# Patient Record
Sex: Male | Born: 1975 | Race: Black or African American | Hispanic: No | Marital: Single | Smoking: Never smoker
Health system: Southern US, Community
[De-identification: ages and names within clinical notes are randomized; demographics above are authoritative.]

## PROBLEM LIST (undated history)

## (undated) DIAGNOSIS — U071 COVID-19: Secondary | ICD-10-CM

## (undated) DIAGNOSIS — J189 Pneumonia, unspecified organism: Secondary | ICD-10-CM

## (undated) DIAGNOSIS — J8410 Pulmonary fibrosis, unspecified: Secondary | ICD-10-CM

## (undated) DIAGNOSIS — J1282 Pneumonia due to coronavirus disease 2019: Secondary | ICD-10-CM

## (undated) DIAGNOSIS — F840 Autistic disorder: Secondary | ICD-10-CM

## (undated) DIAGNOSIS — J9621 Acute and chronic respiratory failure with hypoxia: Secondary | ICD-10-CM

## (undated) DIAGNOSIS — D573 Sickle-cell trait: Secondary | ICD-10-CM

## (undated) HISTORY — PX: NO PAST SURGERIES: SHX2092

---

## 2015-02-12 DIAGNOSIS — J189 Pneumonia, unspecified organism: Secondary | ICD-10-CM

## 2015-02-12 HISTORY — DX: Pneumonia, unspecified organism: J18.9

## 2015-02-28 ENCOUNTER — Encounter (HOSPITAL_BASED_OUTPATIENT_CLINIC_OR_DEPARTMENT_OTHER): Payer: Self-pay | Admitting: *Deleted

## 2015-02-28 ENCOUNTER — Emergency Department (HOSPITAL_BASED_OUTPATIENT_CLINIC_OR_DEPARTMENT_OTHER): Payer: Medicare Other

## 2015-02-28 ENCOUNTER — Inpatient Hospital Stay (HOSPITAL_BASED_OUTPATIENT_CLINIC_OR_DEPARTMENT_OTHER)
Admission: EM | Admit: 2015-02-28 | Discharge: 2015-03-10 | DRG: 193 | Disposition: A | Discharge status: Home / Self Care | Payer: Medicare Other | Attending: Internal Medicine | Admitting: Internal Medicine

## 2015-02-28 DIAGNOSIS — S27309S Unspecified injury of lung, unspecified, sequela: Secondary | ICD-10-CM | POA: Diagnosis not present

## 2015-02-28 DIAGNOSIS — R000 Tachycardia, unspecified: Secondary | ICD-10-CM | POA: Diagnosis not present

## 2015-02-28 DIAGNOSIS — Z6833 Body mass index (BMI) 33.0-33.9, adult: Secondary | ICD-10-CM | POA: Diagnosis not present

## 2015-02-28 DIAGNOSIS — D573 Sickle-cell trait: Secondary | ICD-10-CM | POA: Diagnosis present

## 2015-02-28 DIAGNOSIS — A419 Sepsis, unspecified organism: Secondary | ICD-10-CM

## 2015-02-28 DIAGNOSIS — E669 Obesity, unspecified: Secondary | ICD-10-CM | POA: Diagnosis present

## 2015-02-28 DIAGNOSIS — R0600 Dyspnea, unspecified: Secondary | ICD-10-CM | POA: Insufficient documentation

## 2015-02-28 DIAGNOSIS — S27309A Unspecified injury of lung, unspecified, initial encounter: Secondary | ICD-10-CM

## 2015-02-28 DIAGNOSIS — J9601 Acute respiratory failure with hypoxia: Secondary | ICD-10-CM | POA: Diagnosis present

## 2015-02-28 DIAGNOSIS — R0689 Other abnormalities of breathing: Secondary | ICD-10-CM | POA: Diagnosis not present

## 2015-02-28 DIAGNOSIS — F840 Autistic disorder: Secondary | ICD-10-CM | POA: Diagnosis present

## 2015-02-28 DIAGNOSIS — R509 Fever, unspecified: Secondary | ICD-10-CM

## 2015-02-28 DIAGNOSIS — R0902 Hypoxemia: Secondary | ICD-10-CM | POA: Diagnosis present

## 2015-02-28 DIAGNOSIS — J189 Pneumonia, unspecified organism: Principal | ICD-10-CM

## 2015-02-28 DIAGNOSIS — E876 Hypokalemia: Secondary | ICD-10-CM

## 2015-02-28 HISTORY — DX: Pneumonia, unspecified organism: J18.9

## 2015-02-28 HISTORY — DX: Sickle-cell trait: D57.3

## 2015-02-28 HISTORY — DX: Autistic disorder: F84.0

## 2015-02-28 LAB — CBC WITH DIFFERENTIAL/PLATELET
BASOS ABS: 0 10*3/uL (ref 0.0–0.1)
BASOS PCT: 0 %
Basophils Absolute: 0 10*3/uL (ref 0.0–0.1)
Basophils Relative: 0 %
EOS ABS: 0.3 10*3/uL (ref 0.0–0.7)
EOS PCT: 3 %
EOS PCT: 3 %
Eosinophils Absolute: 0.2 10*3/uL (ref 0.0–0.7)
HCT: 41.5 % (ref 39.0–52.0)
HEMATOCRIT: 41.4 % (ref 39.0–52.0)
Hemoglobin: 13.7 g/dL (ref 13.0–17.0)
Hemoglobin: 14.1 g/dL (ref 13.0–17.0)
LYMPHS ABS: 1.5 10*3/uL (ref 0.7–4.0)
LYMPHS PCT: 17 %
Lymphocytes Relative: 22 %
Lymphs Abs: 1.7 10*3/uL (ref 0.7–4.0)
MCH: 26 pg (ref 26.0–34.0)
MCH: 26.9 pg (ref 26.0–34.0)
MCHC: 33 g/dL (ref 30.0–36.0)
MCHC: 34.1 g/dL (ref 30.0–36.0)
MCV: 78.7 fL (ref 78.0–100.0)
MCV: 78.9 fL (ref 78.0–100.0)
MONO ABS: 1.2 10*3/uL — AB (ref 0.1–1.0)
MONO ABS: 1 10*3/uL (ref 0.1–1.0)
MONOS PCT: 14 %
MONOS PCT: 13 %
Neutro Abs: 5.9 10*3/uL (ref 1.7–7.7)
Neutro Abs: 4.8 10*3/uL (ref 1.7–7.7)
Neutrophils Relative %: 66 %
Neutrophils Relative %: 62 %
PLATELETS: 229 10*3/uL (ref 150–400)
PLATELETS: 219 10*3/uL (ref 150–400)
RBC: 5.27 MIL/uL (ref 4.22–5.81)
RBC: 5.25 MIL/uL (ref 4.22–5.81)
RDW: 14.6 % (ref 11.5–15.5)
RDW: 14.6 % (ref 11.5–15.5)
WBC: 8.8 10*3/uL (ref 4.0–10.5)
WBC: 7.7 10*3/uL (ref 4.0–10.5)

## 2015-02-28 LAB — COMPREHENSIVE METABOLIC PANEL
ALBUMIN: 3.3 g/dL — AB (ref 3.5–5.0)
ALT: 19 U/L (ref 17–63)
ALT: 18 U/L (ref 17–63)
ANION GAP: 10 (ref 5–15)
AST: 31 U/L (ref 15–41)
AST: 30 U/L (ref 15–41)
Albumin: 3.6 g/dL (ref 3.5–5.0)
Alkaline Phosphatase: 56 U/L (ref 38–126)
Alkaline Phosphatase: 56 U/L (ref 38–126)
Anion gap: 8 (ref 5–15)
BILIRUBIN TOTAL: 0.7 mg/dL (ref 0.3–1.2)
BILIRUBIN TOTAL: 0.5 mg/dL (ref 0.3–1.2)
BUN: 8 mg/dL (ref 6–20)
BUN: 5 mg/dL — ABNORMAL LOW (ref 6–20)
CALCIUM: 8.3 mg/dL — AB (ref 8.9–10.3)
CALCIUM: 8.6 mg/dL — AB (ref 8.9–10.3)
CHLORIDE: 104 mmol/L (ref 101–111)
CO2: 27 mmol/L (ref 22–32)
CO2: 26 mmol/L (ref 22–32)
CREATININE: 1 mg/dL (ref 0.61–1.24)
Chloride: 102 mmol/L (ref 101–111)
Creatinine, Ser: 1.02 mg/dL (ref 0.61–1.24)
GFR calc Af Amer: >60 mL/min (ref >60)
GFR calc Af Amer: >60 mL/min (ref >60)
GFR calc non Af Amer: >60 mL/min (ref >60)
GLUCOSE: 90 mg/dL (ref 65–99)
Glucose, Bld: 96 mg/dL (ref 65–99)
POTASSIUM: 3.4 mmol/L — AB (ref 3.5–5.1)
POTASSIUM: 3.5 mmol/L (ref 3.5–5.1)
SODIUM: 140 mmol/L (ref 135–145)
Sodium: 137 mmol/L (ref 135–145)
TOTAL PROTEIN: 7.3 g/dL (ref 6.5–8.1)
Total Protein: 7.1 g/dL (ref 6.5–8.1)

## 2015-02-28 LAB — STREP PNEUMONIAE URINARY ANTIGEN: STREP PNEUMO URINARY ANTIGEN: NEGATIVE

## 2015-02-28 LAB — PROCALCITONIN: Procalcitonin: <0.1 ng/mL

## 2015-02-28 LAB — APTT: APTT: 34 s (ref 24–37)

## 2015-02-28 LAB — PROTIME-INR
INR: 1.01 (ref 0.00–1.49)
Prothrombin Time: 13.5 seconds (ref 11.6–15.2)

## 2015-02-28 LAB — RAPID STREP SCREEN (MED CTR MEBANE ONLY): Streptococcus, Group A Screen (Direct): NEGATIVE

## 2015-02-28 LAB — LACTIC ACID, PLASMA
LACTIC ACID, VENOUS: 1.1 mmol/L (ref 0.5–2.0)
LACTIC ACID, VENOUS: 1.5 mmol/L (ref 0.5–2.0)

## 2015-02-28 LAB — I-STAT CG4 LACTIC ACID, ED: Lactic Acid, Venous: 1.19 mmol/L (ref 0.5–2.0)

## 2015-02-28 MED ORDER — DEXTROSE 5 % IV SOLN
1.0000 g | INTRAVENOUS | Status: DC
Start: 2015-03-01 — End: 2015-03-02
  Administered 2015-03-01 – 2015-03-02 (×2): 1 g via INTRAVENOUS
  Filled 2015-02-28 (×2): qty 10

## 2015-02-28 MED ORDER — ALBUTEROL SULFATE (2.5 MG/3ML) 0.083% IN NEBU
2.5000 mg | INHALATION_SOLUTION | Freq: Four times a day (QID) | RESPIRATORY_TRACT | Status: DC
  Administered 2015-02-28: 2.5 mg via RESPIRATORY_TRACT
  Filled 2015-02-28: qty 3

## 2015-02-28 MED ORDER — ONDANSETRON HCL 4 MG/2ML IJ SOLN: 4.0000 mg | Freq: Four times a day (QID) | INTRAMUSCULAR | Status: DC | PRN

## 2015-02-28 MED ORDER — SODIUM CHLORIDE 0.9 % IV BOLUS (SEPSIS)
1000.0000 mL | Freq: Once | INTRAVENOUS | Status: AC
  Administered 2015-02-28: 1000 mL via INTRAVENOUS

## 2015-02-28 MED ORDER — ACETAMINOPHEN 325 MG PO TABS
650.0000 mg | ORAL_TABLET | Freq: Four times a day (QID) | ORAL | Status: DC | PRN
  Administered 2015-03-02 – 2015-03-10 (×3): 650 mg via ORAL
  Filled 2015-02-28 (×3): qty 2

## 2015-02-28 MED ORDER — ALBUTEROL SULFATE (2.5 MG/3ML) 0.083% IN NEBU: 2.5000 mg | INHALATION_SOLUTION | RESPIRATORY_TRACT | Status: DC | PRN

## 2015-02-28 MED ORDER — ONDANSETRON HCL 4 MG PO TABS: 4.0000 mg | ORAL_TABLET | Freq: Four times a day (QID) | ORAL | Status: DC | PRN

## 2015-02-28 MED ORDER — DEXTROSE 5 % IV SOLN
500.0000 mg | Freq: Once | INTRAVENOUS | Status: AC
  Administered 2015-02-28: 500 mg via INTRAVENOUS

## 2015-02-28 MED ORDER — ACETAMINOPHEN 650 MG RE SUPP: 650.0000 mg | Freq: Four times a day (QID) | RECTAL | Status: DC | PRN

## 2015-02-28 MED ORDER — AZITHROMYCIN 500 MG IV SOLR
INTRAVENOUS | Status: AC
  Filled 2015-02-28: qty 500

## 2015-02-28 MED ORDER — HEPARIN SODIUM (PORCINE) 5000 UNIT/ML IJ SOLN
5000.0000 [IU] | Freq: Three times a day (TID) | INTRAMUSCULAR | Status: DC
  Administered 2015-02-28 – 2015-03-09 (×23): 5000 [IU] via SUBCUTANEOUS
  Filled 2015-02-28 (×22): qty 1

## 2015-02-28 MED ORDER — OSELTAMIVIR PHOSPHATE 75 MG PO CAPS
75.0000 mg | ORAL_CAPSULE | Freq: Two times a day (BID) | ORAL | Status: DC
Start: 2015-02-28 — End: 2015-03-01
  Administered 2015-02-28: 75 mg via ORAL
  Filled 2015-02-28 (×3): qty 1

## 2015-02-28 MED ORDER — SODIUM CHLORIDE 0.9 % IV SOLN
INTRAVENOUS | Status: AC
  Administered 2015-03-01: 17:00:00 via INTRAVENOUS

## 2015-02-28 MED ORDER — SODIUM CHLORIDE 0.9% FLUSH
3.0000 mL | Freq: Two times a day (BID) | INTRAVENOUS | Status: DC
  Administered 2015-03-01 – 2015-03-10 (×15): 3 mL via INTRAVENOUS

## 2015-02-28 MED ORDER — POLYETHYLENE GLYCOL 3350 17 G PO PACK
17.0000 g | PACK | Freq: Every day | ORAL | Status: DC | PRN
  Administered 2015-03-04 – 2015-03-05 (×2): 17 g via ORAL
  Filled 2015-02-28 (×2): qty 1

## 2015-02-28 MED ORDER — DEXTROSE 5 % IV SOLN
1.0000 g | Freq: Once | INTRAVENOUS | Status: AC
  Administered 2015-02-28: 1 g via INTRAVENOUS
  Filled 2015-02-28: qty 10

## 2015-02-28 MED ORDER — ALBUTEROL SULFATE (2.5 MG/3ML) 0.083% IN NEBU
2.5000 mg | INHALATION_SOLUTION | Freq: Two times a day (BID) | RESPIRATORY_TRACT | Status: DC
  Administered 2015-03-01 – 2015-03-03 (×6): 2.5 mg via RESPIRATORY_TRACT
  Filled 2015-02-28 (×6): qty 3

## 2015-02-28 MED ORDER — IPRATROPIUM-ALBUTEROL 0.5-2.5 (3) MG/3ML IN SOLN
3.0000 mL | Freq: Once | RESPIRATORY_TRACT | Status: AC
  Administered 2015-02-28: 3 mL via RESPIRATORY_TRACT
  Filled 2015-02-28: qty 3

## 2015-02-28 MED ORDER — DEXTROSE 5 % IV SOLN
500.0000 mg | INTRAVENOUS | Status: DC
  Administered 2015-03-01: 500 mg via INTRAVENOUS
  Filled 2015-02-28: qty 500

## 2015-02-28 MED ORDER — METHYLPREDNISOLONE SODIUM SUCC 125 MG IJ SOLR
60.0000 mg | Freq: Four times a day (QID) | INTRAMUSCULAR | Status: DC
  Administered 2015-02-28 – 2015-03-01 (×2): 60 mg via INTRAVENOUS
  Filled 2015-02-28 (×2): qty 2

## 2015-02-28 MED ORDER — POTASSIUM CHLORIDE CRYS ER 20 MEQ PO TBCR
40.0000 meq | EXTENDED_RELEASE_TABLET | Freq: Two times a day (BID) | ORAL | Status: AC
  Administered 2015-02-28 – 2015-03-01 (×2): 40 meq via ORAL
  Filled 2015-02-28 (×2): qty 2

## 2015-02-28 NOTE — ED Notes (Signed)
Confirmed PNA with PCP today.  Pt tachypnea, reports chest tightness.  Decreased appetite, facial swelling noted.

## 2015-02-28 NOTE — ED Notes (Signed)
MD at bedside. 

## 2015-02-28 NOTE — Progress Notes (Signed)
Patient coming from [facility] with multifocal PNA.  85% on RA-- up to 96% on 3L.  From home so will treat as CAP. Tele admit  Marlin Canary DO

## 2015-02-28 NOTE — ED Provider Notes (Signed)
CSN: [id number]     Arrival date & time 02/28/15  1008 History   First MD Initiated Contact with Patient 02/28/15 1019     Chief Complaint  Patient presents with  . Pneumonia    Login Juan Cameron is a 40 y.o. male with history of autism who presents to the emergency department the sister today who reports the patient has had ongoing cough for the past 2 weeks. They report this is worsened recently and today he has had more shortness of breath and slight wheezing. They report he saw his primary care doctor yesterday who did a chest x-ray and he was diagnosed with pneumonia when they called him this morning. The patient has not been started on antibiotics. She reports today while the patient was at school he has increased difficulty breathing and some wheezing. The patient does not have a history of pneumonia and he is not a smoker. No fevers, hemoptysis, abdominal pain, nausea, vomiting, diarrhea, rashes, or changes to his urination.  Patient is a 40 y.o. male presenting with pneumonia. The history is provided by the patient. No language interpreter was used.  Pneumonia Associated symptoms include coughing. Pertinent negatives include no abdominal pain, chest pain, chills, congestion, fever, headaches, nausea, neck pain, rash, sore throat or vomiting.    Past Medical History  Diagnosis Date  . Autism   . Sickle cell trait (HCC)    History reviewed. No pertinent past surgical history. History reviewed. No pertinent family history. Social History  Substance Use Topics  . Smoking status: Never Smoker   . Smokeless tobacco: None  . Alcohol Use: No    Review of Systems  Constitutional: Positive for appetite change. Negative for fever and chills.  HENT: Negative for congestion, sneezing, sore throat and trouble swallowing.   Eyes: Negative for visual disturbance.  Respiratory: Positive for cough, shortness of breath and wheezing.   Cardiovascular: Negative for chest pain.  Gastrointestinal:  Negative for nausea, vomiting, abdominal pain and diarrhea.  Genitourinary: Negative for dysuria, decreased urine volume and difficulty urinating.  Musculoskeletal: Negative for back pain, neck pain and neck stiffness.  Skin: Negative for rash.  Neurological: Negative for syncope and headaches.      Allergies  Peanuts  Home Medications   Prior to Admission medications   Not on File   BP 162/100 mmHg  Pulse 97  Temp(Src) 99.1 F (37.3 C) (Oral)  Resp 30  Ht  (1.753 m)  Wt 107.049 kg  BMI 34.84 kg/m2  SpO2 92% Physical Exam  Constitutional: He appears well-developed and well-nourished. No distress.  Nontoxic appearing.  HENT:  Head: Normocephalic and atraumatic.  Right Ear: External ear normal.  Left Ear: External ear normal.  Mouth/Throat: Oropharynx is clear and moist.  Bilateral tympanic membranes are pearly-gray without erythema or loss of landmarks.   Eyes: Conjunctivae are normal. Pupils are equal, round, and reactive to light. Right eye exhibits no discharge. Left eye exhibits no discharge.  Neck: Normal range of motion. Neck supple. No JVD present. No tracheal deviation present.  Cardiovascular: Normal rate, regular rhythm, normal heart sounds and intact distal pulses.  Exam reveals no gallop and no friction rub.   No murmur heard. Pulmonary/Chest: Effort normal. No respiratory distress. He has wheezes.  Patient has scattered crackles to his bilateral bases. No wheezing noted. On arrival patient's oxygen saturation is 85% on room air. On my evaluation patient's oxygen saturation is 92% on 3 L via nasal cannula. Symmetric chest expansion bilaterally. No respiratory  distress. Patient speaking in complete sentences.  Abdominal: Soft. He exhibits no distension. There is no tenderness. There is no guarding.  Musculoskeletal: He exhibits no edema or tenderness.  Lymphadenopathy:    He has no cervical adenopathy.  Neurological: He is alert. Coordination normal.   Skin: Skin is warm and dry. No rash noted. He is not diaphoretic. No erythema. No pallor.  Psychiatric: He has a normal mood and affect. His behavior is normal.  Nursing note and vitals reviewed.   ED Course  Procedures (including critical care time) Labs Review Labs Reviewed  RAPID STREP SCREEN (NOT AT [facility])  CULTURE, BLOOD (ROUTINE X 2)  CULTURE, BLOOD (ROUTINE X 2)  COMPREHENSIVE METABOLIC PANEL  CBC WITH DIFFERENTIAL/PLATELET  I-STAT CG4 LACTIC ACID, ED    Imaging Review Dg Chest 2 View  02/28/2015  CLINICAL DATA:  Cough and chest tightness for 1 week. Rapid respirations. Initial encounter. EXAM: CHEST  2 VIEW COMPARISON:  None. FINDINGS: There is bilateral lower lobe and right middle lobe airspace disease most consistent with pneumonia. No pneumothorax or pleural effusion is identified. Heart size is normal. Lung volumes are low. IMPRESSION: Extensive bilateral lower lobe and right middle lobe airspace disease most consistent with pneumonia. Electronically Signed   By: Drusilla Kanner M.D.   On: 02/28/2015 11:14   I have personally reviewed and evaluated these images and lab results as part of my medical decision-making.   EKG Interpretation None      Filed Vitals:   02/28/15 1013 02/28/15 1035 02/28/15 1050  BP: 162/100    Pulse: 97    Temp: 99.1 F (37.3 C)    TempSrc: Oral    Resp: 24 30   Height: 5\' 9"  (1.753 m)    Weight: 107.049 kg    SpO2: 88% 92% 92%     MDM   Meds given in ED:  Medications  azithromycin (ZITHROMAX) 500 MG injection (  Not Given 02/28/15 1155)  albuterol (PROVENTIL) (2.5 MG/3ML) 0.083% nebulizer solution 2.5 mg (not administered)  sodium chloride 0.9 % bolus 1,000 mL (0 mLs Intravenous Stopped 02/28/15 1505)  ipratropium-albuterol (DUONEB) 0.5-2.5 (3) MG/3ML nebulizer solution 3 mL (3 mLs Nebulization Given 02/28/15 1049)  cefTRIAXone (ROCEPHIN) 1 g in dextrose 5 % 50 mL IVPB (0 g Intravenous Stopped 02/28/15 1155)  azithromycin  (ZITHROMAX) 500 mg in dextrose 5 % 250 mL IVPB (0 mg Intravenous Stopped 02/28/15 1505)    New Prescriptions   No medications on file    Final diagnoses:  CAP (community acquired pneumonia)  Hypoxia   This is a 40 y.o. male with history of autism who presents to the emergency department the sister today who reports the patient has had ongoing cough for the past 2 weeks. They report this is worsened recently and today he has had more shortness of breath and slight wheezing. They report he saw his primary care doctor yesterday who did a chest x-ray and he was diagnosed with pneumonia when they called him this morning. The patient has not been started on antibiotics. She reports today while the patient was at school he has increased difficulty breathing and some wheezing.  On exam the patient is afebrile and nontoxic appearing. Patient has diminished lung sounds to his bilateral bases with scattered wheezing. He is not tachypneic. On arrival to the emergency department his oxygen saturation was 85% on room air. On 3 L via nasal cannula his oxygen improved to 92%. Chest x-ray indicates multifocal pneumonia. Will start on  antibiotic therapy with azithromycin and Rocephin and will consult for admission due to hypoxia. Blood cultures drawn. Lactic acid is normal. CBC shows no leukocytosis. I consulted with hospitalist Dr. Benjamine Mola who accepted the patient for admission. She requested telemetry temporary admission orders. The patient and family are in agreement with admission.   This patient was discussed with Dr. Criss Alvine who agrees with assessment and plan.      Everlene Farrier, PA-C 02/28/15 1721  Pricilla Loveless, MD 03/03/15 [phone]

## 2015-02-28 NOTE — H&P (Signed)
[facility] History and Physical     History and Physical:    Juan Cameron   ZOX:[id number] DOB: July 15, 1975 DOA: 02/28/2015  Referring MD/provider:  PCP: ASRES,ALEHEGN, MD   Chief Complaint: hypoxia  History of Present Illness:   Juan Cameron is an 40 y.o. male past medical history of autism was incidentally emergency department for hypoxia is sister is here today and provided most of the history, she relates that for the past 2 weeks he's been having a cough that has progressively gotten worse. He saw her primary care doctor the day prior to admission who did a chest x-ray and called her up on the day of admission to inform her of the results and come to the ED. The patient was not giving antibiotics. She denies any fever but he has significant shortness of breath especially with ambulation. No sick contacts at home.  ROS:   ROS  Constitutional: No fever, no chills;  Appetite normal; No weight loss, no weight gain, no fatigue.   HEENT: No blurry vision, no diplopia, no pharyngitis, no dysphagia  CV: No chest pain, no palpitations, no PND, no orthopnea, no edema.   Resp:  no pleuritic pain.  GI: No nausea, no vomiting, no diarrhea, no melena, no hematochezia, no constipation, no abdominal pain.   GU: No dysuria, no hematuria, no frequency, no urgency.  MSK: No myalgias, no arthralgias.   Neuro:  No headache, no focal neurological deficits, no history of seizures.   Psych: No depression, no anxiety.   Endo: No heat intolerance, no cold intolerance, no polyuria, no polydipsia   Skin: No rashes, no skin lesions.   Heme: No easy bruising.   Travel history: No recent travel.   Past Medical History:   Past Medical History  Diagnosis Date  . Autism   . Sickle cell trait (HCC)   . Pneumonia 02/2015  . Autism     Past Surgical History:   Past Surgical History  Procedure Laterality Date  . No past surgeries      Social History:   Social History   Social History    . Marital Status: Single    Spouse Name: N/A  . Number of Children: N/A  . Years of Education: N/A   Occupational History  . Not on file.   Social History Main Topics  . Smoking status: Never Smoker   . Smokeless tobacco: Never Used  . Alcohol Use: No  . Drug Use: No  . Sexual Activity: Yes   Other Topics Concern  . Not on file   Social History Narrative  . No narrative on file    Family history:   Family History  Problem Relation Age of Onset  . Heart attack Mother   father died of a heroin overdose.   Allergies   Peanuts  Current Medications:   Prior to Admission medications   Medication Sig Start Date End Date Taking? Authorizing Provider  albuterol (PROAIR HFA) 108 (90 Base) MCG/ACT inhaler Inhale 2 puffs into the lungs every 6 (six) hours as needed for wheezing or shortness of breath.  02/27/15  Yes Historical Provider, MD  guaiFENesin (MUCINEX) 600 MG 12 hr tablet Take 600 mg by mouth 2 (two) times daily as needed for cough or to loosen phlegm.   Yes Historical Provider, MD    Physical Exam:   Filed Vitals:   02/28/15 1346 02/28/15 1524 02/28/15 1607 02/28/15 1742  BP: 128/80 126/85 121/87 139/85  Pulse: 99 93 95 88  Temp: 99.8 F (37.7 C)  98.2 F (36.8 C) 98.9 F (37.2 C)  TempSrc: Oral   Oral  Resp: 18 32 32 98  Height:     (1.753 m)  Weight:    103.5 kg (228 lb 2.8 oz)  SpO2: 92% 92% 92% 93%     Physical Exam: Blood pressure 139/85, pulse 88, temperature 98.9 F (37.2 C), temperature source Oral, resp. rate 98, height  (1.753 m), weight 103.5 kg (228 lb 2.8 oz), SpO2 93 %. Gen: No acute distress. Head: Normocephalic, atraumatic. Eyes: PERRL, EOMI, sclerae nonicteric. Mouth: Oropharynx Neck: Supple, no thyromegaly, no lymphadenopathy, no jugular venous distention. Chest: moderate air movement with crackles diffusely  CV: regular rate and rhythm with positive S1-S2  Abdomen: Soft, nontender, nondistended with normal active bowel  sounds. Extremities: Extremities Skin: Warm and dry. Neuro: Alert and oriented times 1; cranial nerves II through XII grossly intact. Psych: Mood and affect normal.   Data Review:    Labs: Basic Metabolic Panel:  Recent Labs Lab 02/28/15 1100  NA 137  K 3.4*  CL 102  CO2 27  GLUCOSE 96  BUN 8  CREATININE 1.02  CALCIUM 8.3*   Liver Function Tests:  Recent Labs Lab 02/28/15 1100  AST 31  ALT 19  ALKPHOS 56  BILITOT 0.7  PROT 7.3  ALBUMIN 3.6   No results for input(s): LIPASE, AMYLASE in the last 168 hours. No results for input(s): AMMONIA in the last 168 hours. CBC:  Recent Labs Lab 02/28/15 1100  WBC 8.8  NEUTROABS 5.9  HGB 13.7  HCT 41.5  MCV 78.7  PLT 229   Cardiac Enzymes: No results for input(s): CKTOTAL, CKMB, CKMBINDEX, TROPONINI in the last 168 hours.  BNP (last 3 results) No results for input(s): PROBNP in the last 8760 hours. CBG: No results for input(s): GLUCAP in the last 168 hours.  Radiographic Studies: Dg Chest 2 View  02/28/2015  CLINICAL DATA:  Cough and chest tightness for 1 week. Rapid respirations. Initial encounter. EXAM: CHEST  2 VIEW COMPARISON:  None. FINDINGS: There is bilateral lower lobe and right middle lobe airspace disease most consistent with pneumonia. No pneumothorax or pleural effusion is identified. Heart size is normal. Lung volumes are low. IMPRESSION: Extensive bilateral lower lobe and right middle lobe airspace disease most consistent with pneumonia. Electronically Signed   By: Drusilla Kanner M.D.   On: 02/28/2015 11:14   *I have personally reviewed the images above*  EKG: Independently reviewed.   Assessment/Plan:   Active Problems:   Acute respiratory failure with hypoxia (HCC)   CAP (community acquired pneumonia)   Hypokalemia I agree with IV Rocephin and azithromycin, this is to relation she has not had a fever at home. But he does feel significantly warm physical examination. I will have him check  an influenza PCR start him empirically on Tamiflu. We'll check lactic acid and hold on fluid, his borderline tachycardic blood pressure is stable. But he is significantly hypoxic. We'll start him on IV steroids as he is moving air poorly.     DVT prophylaxis  Code Status: Full. Family Communication: daughter Disposition Plan: inpatient 2-3 days  Time spent: 59 min  Marinda Elk [facility] Pager [phone]  If 7PM-7AM, please contact night-coverage [email or web address] Password [email or web address] 02/28/2015, 6:04 PM

## 2015-02-28 NOTE — Progress Notes (Signed)
Pt received on unit, telemetry leads applied, double verified with central monitoring. Pt resting comfortably in bed, no signs of distress or difficulty breathing. 2L of O2 via [place] applied. Family at bedside.  Pt and family express understanding of need for staff to be present prior to pt getting out of bed.

## 2015-02-28 NOTE — ED Notes (Signed)
Pts sister [name].  [phone]

## 2015-03-01 LAB — COMPREHENSIVE METABOLIC PANEL
ALT: 21 U/L (ref 17–63)
ANION GAP: 8 (ref 5–15)
AST: 30 U/L (ref 15–41)
Albumin: 3.3 g/dL — ABNORMAL LOW (ref 3.5–5.0)
Alkaline Phosphatase: 57 U/L (ref 38–126)
BILIRUBIN TOTAL: 0.5 mg/dL (ref 0.3–1.2)
BUN: 5 mg/dL — ABNORMAL LOW (ref 6–20)
CHLORIDE: 106 mmol/L (ref 101–111)
CO2: 27 mmol/L (ref 22–32)
Calcium: 9 mg/dL (ref 8.9–10.3)
Creatinine, Ser: 1.01 mg/dL (ref 0.61–1.24)
Glucose, Bld: 158 mg/dL — ABNORMAL HIGH (ref 65–99)
POTASSIUM: 4.8 mmol/L (ref 3.5–5.1)
Sodium: 141 mmol/L (ref 135–145)
TOTAL PROTEIN: 7.3 g/dL (ref 6.5–8.1)

## 2015-03-01 LAB — CBC
HEMATOCRIT: 42.8 % (ref 39.0–52.0)
Hemoglobin: 14.3 g/dL (ref 13.0–17.0)
MCH: 26.6 pg (ref 26.0–34.0)
MCHC: 33.4 g/dL (ref 30.0–36.0)
MCV: 79.6 fL (ref 78.0–100.0)
PLATELETS: 249 10*3/uL (ref 150–400)
RBC: 5.38 MIL/uL (ref 4.22–5.81)
RDW: 14.6 % (ref 11.5–15.5)
WBC: 8 10*3/uL (ref 4.0–10.5)

## 2015-03-01 LAB — HIV ANTIBODY (ROUTINE TESTING W REFLEX): HIV SCREEN 4TH GENERATION: NONREACTIVE

## 2015-03-01 LAB — INFLUENZA PANEL BY PCR (TYPE A & B)
H1N1FLUPCR: NOT DETECTED
INFLAPCR: NEGATIVE
INFLBPCR: NEGATIVE

## 2015-03-01 MED ORDER — PREDNISONE 20 MG PO TABS
20.0000 mg | ORAL_TABLET | Freq: Every day | ORAL | Status: DC
  Administered 2015-03-01 – 2015-03-02 (×2): 20 mg via ORAL
  Filled 2015-03-01 (×2): qty 1

## 2015-03-01 MED ORDER — AZITHROMYCIN 500 MG PO TABS
500.0000 mg | ORAL_TABLET | Freq: Every day | ORAL | Status: DC
  Filled 2015-03-01 (×2): qty 1

## 2015-03-01 NOTE — Progress Notes (Signed)
Pt. O2 sats drop to 80 with ambulation on RA, SOB occurred. Teresa Coombs 7:03 PM

## 2015-03-01 NOTE — Progress Notes (Signed)
[facility] PROGRESS NOTE    Progress Note   Juan Cameron ZHY:[id number] DOB: 18-Mar-1975 DOA: 02/28/2015 PCP: Ananias Pilgrim, MD   Brief Narrative:   Juan Cameron is an 40 y.o. male the past medical history of autism that comes in for hypoxia and diagnosed with pneumonia.  Assessment/Plan:   Acute respiratory failure with hypoxia (HCC) due to CAP (community acquired pneumonia) He continues to desaturate with ambulation, has remained afebrile with no leukocytosis. Lactic acid was less than 2, his influenza PCR was negative. Continue IV empiric antibiotics Rocephin and azithromycin. Continue albuterol treatments.    Hypokalemia: Resolved with repletion.    Autism    DVT Prophylaxis - Lovenox ordered.  Family Communication: sister Disposition Plan: Home 3-4 days Code Status:     Code Status Orders        Start     Ordered   02/28/15 1807  Full code   Continuous     02/28/15 1813    Code Status History    Date Active Date Inactive Code Status Order ID Comments User Context   This patient has a current code status but no historical code status.        IV Access:    Peripheral IV   Procedures and diagnostic studies:   Dg Chest 2 View  02/28/2015  CLINICAL DATA:  Cough and chest tightness for 1 week. Rapid respirations. Initial encounter. EXAM: CHEST  2 VIEW COMPARISON:  None. FINDINGS: There is bilateral lower lobe and right middle lobe airspace disease most consistent with pneumonia. No pneumothorax or pleural effusion is identified. Heart size is normal. Lung volumes are low. IMPRESSION: Extensive bilateral lower lobe and right middle lobe airspace disease most consistent with pneumonia. Electronically Signed   By: Drusilla Kanner M.D.   On: 02/28/2015 11:14     Medical Consultants:    None.  Anti-Infectives:   Anti-infectives    Start     Dose/Rate Route Frequency Ordered Stop   03/01/15 1200  cefTRIAXone (ROCEPHIN) 1 g in dextrose 5 % 50 mL  IVPB     1 g 100 mL/hr over 30 Minutes Intravenous Every 24 hours 02/28/15 1813 03/07/15 1159   03/01/15 1200  azithromycin (ZITHROMAX) 500 mg in dextrose 5 % 250 mL IVPB     500 mg 250 mL/hr over 60 Minutes Intravenous Every 24 hours 02/28/15 1813 03/07/15 1159   02/28/15 2200  oseltamivir (TAMIFLU) capsule 75 mg     75 mg Oral 2 times daily 02/28/15 1813 03/05/15 2159   02/28/15 1130  cefTRIAXone (ROCEPHIN) 1 g in dextrose 5 % 50 mL IVPB     1 g 100 mL/hr over 30 Minutes Intravenous  Once 02/28/15 1115 02/28/15 1155   02/28/15 1130  azithromycin (ZITHROMAX) 500 mg in dextrose 5 % 250 mL IVPB     500 mg 250 mL/hr over 60 Minutes Intravenous  Once 02/28/15 1115 02/28/15 1505   02/28/15 1124  azithromycin (ZITHROMAX) 500 MG injection    Comments:  [place]   : cabinet override      02/28/15 1124 02/28/15 2329      Subjective:    Juan Cameron he relates he feels much better than yesterday. He relates he gets winded with ambulation.  Objective:    Filed Vitals:   02/28/15 1953 03/01/15 0340 03/01/15 0348 03/01/15 0727  BP: 135/76 140/80    Pulse: 102 95    Temp: 98.8 F (37.1 C) 98.9 F (37.2 C)    TempSrc:  Oral Oral    Resp: 18 18    Height:      Weight:      SpO2: 93% 91% 95% 95%   No intake or output data in the 24 hours ending 03/01/15 0752 Filed Weights   02/28/15 1013 02/28/15 1742  Weight: 107.049 kg (236 lb) 103.5 kg (228 lb 2.8 oz)    Exam: Gen:  NAD Cardiovascular:  RRR, No M/R/G Chest and lungs:   Good air movement with crackles on the right. Abdomen:  Abdomen soft, NT/ND, + BS Extremities:  No cut or ulcers.   Data Reviewed:    Labs: Basic Metabolic Panel:  Recent Labs Lab 02/28/15 1100 02/28/15 1908 03/01/15 0328  NA 137 140 141  K 3.4* 3.5 4.8  CL 102 104 106  CO2 GLUCOSE 96 90 158*  BUN 8 5* 5*  CREATININE 1.02 1.00 1.01  CALCIUM 8.3* 8.6* 9.0   GFR Estimated Creatinine Clearance: 118.7 mL/min (by C-G formula  based on Cr of 1.01). Liver Function Tests:  Recent Labs Lab 02/28/15 1100 02/28/15 1908 03/01/15 0328  AST ALT ALKPHOS 56 56 57  BILITOT 0.7 0.5 0.5  PROT 7.3 7.1 7.3  ALBUMIN 3.6 3.3* 3.3*   No results for input(s): LIPASE, AMYLASE in the last 168 hours. No results for input(s): AMMONIA in the last 168 hours. Coagulation profile  Recent Labs Lab 02/28/15 1908  INR 1.01    CBC:  Recent Labs Lab 02/28/15 1100 02/28/15 1908 03/01/15 0328  WBC 8.8 7.7 8.0  NEUTROABS 5.9 4.8  --   HGB 13.7 14.1 14.3  HCT 41.5 41.4 42.8  MCV 78.7 78.9 79.6  PLT 229 219 249   Cardiac Enzymes: No results for input(s): CKTOTAL, CKMB, CKMBINDEX, TROPONINI in the last 168 hours. BNP (last 3 results) No results for input(s): PROBNP in the last 8760 hours. CBG: No results for input(s): GLUCAP in the last 168 hours. D-Dimer: No results for input(s): DDIMER in the last 72 hours. Hgb A1c: No results for input(s): HGBA1C in the last 72 hours. Lipid Profile: No results for input(s): CHOL, HDL, LDLCALC, TRIG, CHOLHDL, LDLDIRECT in the last 72 hours. Thyroid function studies: No results for input(s): TSH, T4TOTAL, T3FREE, THYROIDAB in the last 72 hours.  Invalid input(s): FREET3 Anemia work up: No results for input(s): VITAMINB12, FOLATE, FERRITIN, TIBC, IRON, RETICCTPCT in the last 72 hours. Sepsis Labs:  Recent Labs Lab 02/28/15 1100 02/28/15 1110 02/28/15 1908 02/28/15 2200 03/01/15 0328  PROCALCITON  --   --  <0.10  --   --   WBC 8.8  --  7.7  --  8.0  LATICACIDVEN  --  1.19 1.1 1.5  --    Microbiology Recent Results (from the past 240 hour(s))  Rapid strep screen     Status: None   Collection Time: 02/28/15 10:30 AM  Result Value Ref Range Status   Streptococcus, Group A Screen (Direct) NEGATIVE NEGATIVE Final    Comment: (NOTE) A Rapid Antigen test may result negative if the antigen level in the sample is below the detection level of this test. The  FDA has not cleared this test as a stand-alone test therefore the rapid antigen negative result has reflexed to a Group A Strep culture.   Culture, blood (routine x 2)     Status: None (Preliminary result)   Collection Time: 02/28/15 11:00 AM  Result Value Ref Range Status  Specimen Description BLOOD RIGHT ARM  Final   Special Requests BOTTLES DRAWN AEROBIC AND ANAEROBIC 5CC  Final   Culture PENDING  Incomplete   Report Status PENDING  Incomplete     Medications:   . albuterol  2.5 mg Nebulization BID  . azithromycin  500 mg Intravenous Q24H  . cefTRIAXone (ROCEPHIN)  IV  1 g Intravenous Q24H  . heparin  5,000 Units Subcutaneous 3 times per day  . methylPREDNISolone (SOLU-MEDROL) injection  60 mg Intravenous Q6H  . oseltamivir  75 mg Oral BID  . potassium chloride  40 mEq Oral BID  . sodium chloride flush  3 mL Intravenous Q12H   Continuous Infusions: . sodium chloride      Time spent: 25 min   LOS: 1 day   Marinda Elk  [facility] Pager [phone]  *Please refer to [email or web address], password [email or web address] to get updated schedule on who will round on this patient, as hospitalists switch teams weekly. If 7PM-7AM, please contact night-coverage at [email or web address], password [email or web address] for any overnight needs.  03/01/2015, 7:52 AM

## 2015-03-01 NOTE — Progress Notes (Signed)
Pt stats 86% on 2L O2 after ambulating to the bathroom. O2 titrated to 3L O2. Sats 95% on 3L. Will conyinue to monitor closely.  Sandrea Hammond RN

## 2015-03-02 ENCOUNTER — Inpatient Hospital Stay (HOSPITAL_COMMUNITY): Payer: Medicare Other

## 2015-03-02 MED ORDER — CETYLPYRIDINIUM CHLORIDE 0.05 % MT LIQD
7.0000 mL | Freq: Two times a day (BID) | OROMUCOSAL | Status: DC
  Administered 2015-03-02 – 2015-03-10 (×12): 7 mL via OROMUCOSAL

## 2015-03-02 MED ORDER — PREDNISONE 20 MG PO TABS
50.0000 mg | ORAL_TABLET | Freq: Every day | ORAL | Status: DC
  Administered 2015-03-03 – 2015-03-10 (×8): 50 mg via ORAL
  Filled 2015-03-02 (×9): qty 2

## 2015-03-02 MED ORDER — LEVOFLOXACIN 750 MG PO TABS
750.0000 mg | ORAL_TABLET | Freq: Every day | ORAL | Status: DC
  Administered 2015-03-02 – 2015-03-07 (×6): 750 mg via ORAL
  Filled 2015-03-02 (×6): qty 1

## 2015-03-02 NOTE — Progress Notes (Signed)
[facility] PROGRESS NOTE    Progress Note   Juan Cameron YQM:[id number] DOB: 1975-03-29 DOA: 02/28/2015 PCP: Ananias Pilgrim, MD   Brief Narrative:   Juan Cameron is an 40 y.o. male the past medical history of autism that comes in for hypoxia and diagnosed with pneumonia.  Assessment/Plan:   Acute respiratory failure with hypoxia (HCC) due to CAP (community acquired pneumonia) He continues to desaturate with ambulation, has remained afebrile with no leukocytosis. Chest x-ray is unchanged from previous. Lactic acid was less than 2, his influenza PCR was negative. Continue IV empiric antibiotics Rocephin and azithromycin. Continue albuterol treatments.    Hypokalemia: Resolved with repletion.  Sinus tachycardia: No evidence on telemetry continues to have sinus tachycardia with ambulation.  Autism    DVT Prophylaxis - Lovenox ordered.  Family Communication: sister Disposition Plan: Home 3 days Code Status:     Code Status Orders        Start     Ordered   02/28/15 1807  Full code   Continuous     02/28/15 1813    Code Status History    Date Active Date Inactive Code Status Order ID Comments User Context   This patient has a current code status but no historical code status.        IV Access:    Peripheral IV   Procedures and diagnostic studies:   Dg Chest 2 View  03/02/2015  CLINICAL DATA:  Dyspnea EXAM: CHEST  2 VIEW COMPARISON:  02/28/2015 chest radiograph. FINDINGS: Low lung volumes. Stable cardiomediastinal silhouette with normal heart size. No pneumothorax. Small bilateral pleural effusions, stable on the right and slightly increased on the left. Extensive patchy consolidation at both lung bases, slightly worsened on the left. No pulmonary edema. IMPRESSION: 1. Low lung volumes. Extensive patchy bibasilar lung consolidation, slightly worsened on the left, favor multifocal pneumonia. 2. Small bilateral pleural effusions, stable on the right and  increased on the left. Electronically Signed   By: Delbert Phenix M.D.   On: 03/02/2015 09:27     Medical Consultants:    None.  Anti-Infectives:   Anti-infectives    Start     Dose/Rate Route Frequency Ordered Stop   03/02/15 1000  azithromycin (ZITHROMAX) tablet 500 mg     500 mg Oral Daily 03/01/15 1133     03/01/15 1200  cefTRIAXone (ROCEPHIN) 1 g in dextrose 5 % 50 mL IVPB     1 g 100 mL/hr over 30 Minutes Intravenous Every 24 hours 02/28/15 1813 03/07/15 1159   03/01/15 1200  azithromycin (ZITHROMAX) 500 mg in dextrose 5 % 250 mL IVPB  Status:  Discontinued     500 mg 250 mL/hr over 60 Minutes Intravenous Every 24 hours 02/28/15 1813 03/01/15 1133   02/28/15 2200  oseltamivir (TAMIFLU) capsule 75 mg  Status:  Discontinued     75 mg Oral 2 times daily 02/28/15 1813 03/01/15 0754   02/28/15 1130  cefTRIAXone (ROCEPHIN) 1 g in dextrose 5 % 50 mL IVPB     1 g 100 mL/hr over 30 Minutes Intravenous  Once 02/28/15 1115 02/28/15 1155   02/28/15 1130  azithromycin (ZITHROMAX) 500 mg in dextrose 5 % 250 mL IVPB     500 mg 250 mL/hr over 60 Minutes Intravenous  Once 02/28/15 1115 02/28/15 1505   02/28/15 1124  azithromycin (ZITHROMAX) 500 MG injection    Comments:  Cranston, Katelyn   : cabinet override      02/28/15 1124 02/28/15 2329  Subjective:    [name] He relates he gets winded with ambulation.  Objective:    Filed Vitals:   03/01/15 1943 03/01/15 2002 03/02/15 0319 03/02/15 0922  BP:  138/76 146/89   Pulse:  118 104   Temp:  97.8 F (36.6 C) 98.2 F (36.8 C)   TempSrc:  Oral Oral   Resp:  20 20   Height:      Weight:      SpO2: 93% 92% 92% 96%    Intake/Output Summary (Last 24 hours) at 03/02/15 1357 Last data filed at 03/02/15 1309  Gross per 24 hour  Intake    942 ml  Output      0 ml  Net    942 ml   Filed Weights   02/28/15 1013 02/28/15 1742  Weight: 107.049 kg (236 lb) 103.5 kg (228 lb 2.8 oz)    Exam: Gen:  NAD Cardiovascular:   RRR, No M/R/G Chest and lungs:   Good air movement with crackles on the right. Abdomen:  Abdomen soft, NT/ND, + BS Extremities:  No cut or ulcers.   Data Reviewed:    Labs: Basic Metabolic Panel:  Recent Labs Lab 02/28/15 1100 02/28/15 1908 03/01/15 0328  NA 137 140 141  K 3.4* 3.5 4.8  CL 102 104 106  CO2 GLUCOSE 96 90 158*  BUN 8 5* 5*  CREATININE 1.02 1.00 1.01  CALCIUM 8.3* 8.6* 9.0   GFR Estimated Creatinine Clearance: 118.7 mL/min (by C-G formula based on Cr of 1.01). Liver Function Tests:  Recent Labs Lab 02/28/15 1100 02/28/15 1908 03/01/15 0328  AST ALT ALKPHOS 56 56 57  BILITOT 0.7 0.5 0.5  PROT 7.3 7.1 7.3  ALBUMIN 3.6 3.3* 3.3*   No results for input(s): LIPASE, AMYLASE in the last 168 hours. No results for input(s): AMMONIA in the last 168 hours. Coagulation profile  Recent Labs Lab 02/28/15 1908  INR 1.01    CBC:  Recent Labs Lab 02/28/15 1100 02/28/15 1908 03/01/15 0328  WBC 8.8 7.7 8.0  NEUTROABS 5.9 4.8  --   HGB 13.7 14.1 14.3  HCT 41.5 41.4 42.8  MCV 78.7 78.9 79.6  PLT 229 219 249   Cardiac Enzymes: No results for input(s): CKTOTAL, CKMB, CKMBINDEX, TROPONINI in the last 168 hours. BNP (last 3 results) No results for input(s): PROBNP in the last 8760 hours. CBG: No results for input(s): GLUCAP in the last 168 hours. D-Dimer: No results for input(s): DDIMER in the last 72 hours. Hgb A1c: No results for input(s): HGBA1C in the last 72 hours. Lipid Profile: No results for input(s): CHOL, HDL, LDLCALC, TRIG, CHOLHDL, LDLDIRECT in the last 72 hours. Thyroid function studies: No results for input(s): TSH, T4TOTAL, T3FREE, THYROIDAB in the last 72 hours.  Invalid input(s): FREET3 Anemia work up: No results for input(s): VITAMINB12, FOLATE, FERRITIN, TIBC, IRON, RETICCTPCT in the last 72 hours. Sepsis Labs:  Recent Labs Lab 02/28/15 1100 02/28/15 1110 02/28/15 1908 02/28/15 2200  03/01/15 0328  PROCALCITON  --   --  <0.10  --   --   WBC 8.8  --  7.7  --  8.0  LATICACIDVEN  --  1.19 1.1 1.5  --    Microbiology Recent Results (from the past 240 hour(s))  Rapid strep screen     Status: None   Collection Time: 02/28/15 10:30 AM  Result Value Ref Range Status   Streptococcus,  Group A Screen (Direct) NEGATIVE NEGATIVE Final    Comment: (NOTE) A Rapid Antigen test may result negative if the antigen level in the sample is below the detection level of this test. The FDA has not cleared this test as a stand-alone test therefore the rapid antigen negative result has reflexed to a Group A Strep culture.   Culture, group A strep     Status: None (Preliminary result)   Collection Time: 02/28/15 10:30 AM  Result Value Ref Range Status   Specimen Description THROAT  Final   Special Requests NONE Reflexed from [id number]  Final   Culture   Final    CULTURE REINCUBATED FOR BETTER GROWTH Performed at [facility]    Report Status PENDING  Incomplete  Culture, blood (routine x 2)     Status: None (Preliminary result)   Collection Time: 02/28/15 11:00 AM  Result Value Ref Range Status   Specimen Description BLOOD RIGHT ARM  Final   Special Requests BOTTLES DRAWN AEROBIC AND ANAEROBIC 5CC  Final   Culture   Final    NO GROWTH < 24 HOURS Performed at [facility]    Report Status PENDING  Incomplete  Culture, blood (routine x 2) Call MD if unable to obtain prior to antibiotics being given     Status: None (Preliminary result)   Collection Time: 02/28/15  7:08 PM  Result Value Ref Range Status   Specimen Description BLOOD LEFT ANTECUBITAL  Final   Special Requests BOTTLES DRAWN AEROBIC AND ANAEROBIC 5CC  Final   Culture NO GROWTH < 24 HOURS  Final   Report Status PENDING  Incomplete  Culture, blood (x 2)     Status: None (Preliminary result)   Collection Time: 02/28/15  7:30 PM  Result Value Ref Range Status   Specimen Description BLOOD LEFT ANTECUBITAL   Final   Special Requests BOTTLES DRAWN AEROBIC AND ANAEROBIC 5CC  Final   Culture NO GROWTH < 24 HOURS  Final   Report Status PENDING  Incomplete     Medications:   . albuterol  2.5 mg Nebulization BID  . antiseptic oral rinse  7 mL Mouth Rinse BID  . azithromycin  500 mg Oral Daily  . cefTRIAXone (ROCEPHIN)  IV  1 g Intravenous Q24H  . heparin  5,000 Units Subcutaneous 3 times per day  . predniSONE  20 mg Oral Q breakfast  . sodium chloride flush  3 mL Intravenous Q12H   Continuous Infusions:    Time spent: 25 min   LOS: 2 days   Marinda Elk  [facility] Pager [phone]  *Please refer to [email or web address], password [email or web address] to get updated schedule on who will round on this patient, as hospitalists switch teams weekly. If 7PM-7AM, please contact night-coverage at [email or web address], password [email or web address] for any overnight needs.  03/02/2015, 1:57 PM

## 2015-03-02 NOTE — Progress Notes (Signed)
Iv attempt in right lateral forearm, not left

## 2015-03-03 ENCOUNTER — Encounter (HOSPITAL_COMMUNITY): Payer: Self-pay | Admitting: Radiology

## 2015-03-03 ENCOUNTER — Inpatient Hospital Stay (HOSPITAL_COMMUNITY): Payer: Medicare Other

## 2015-03-03 LAB — CULTURE, GROUP A STREP (THRC)

## 2015-03-03 MED ORDER — HYDRALAZINE HCL 20 MG/ML IJ SOLN: 10.0000 mg | Freq: Once | INTRAMUSCULAR | Status: DC

## 2015-03-03 MED ORDER — CLONIDINE HCL 0.1 MG PO TABS
0.1000 mg | ORAL_TABLET | Freq: Once | ORAL | Status: AC
  Administered 2015-03-03: 0.1 mg via ORAL
  Filled 2015-03-03: qty 1

## 2015-03-03 MED ORDER — IOHEXOL 350 MG/ML SOLN
80.0000 mL | Freq: Once | INTRAVENOUS | Status: AC | PRN
  Administered 2015-03-03: 80 mL via INTRAVENOUS

## 2015-03-03 NOTE — Progress Notes (Signed)
RN attempted right lateral wrist IV but was unsuccessful due to pt moving while attempting insertion, pt does not have IV access, MD was made aware by previous shift, RN will continue to monitor, pt call bell within reach and bed in lowest position.   03/02/2015 Mila Palmer, RN

## 2015-03-03 NOTE — Progress Notes (Signed)
[facility] PROGRESS NOTE    Progress Note   Juan Cameron ZOX:[id number] DOB: 11/04/1975 DOA: 02/28/2015 PCP: Ananias Pilgrim, MD   Brief Narrative:   Juan Cameron is an 40 y.o. male the past medical history of autism that comes in for hypoxia and diagnosed with pneumonia.  Assessment/Plan:   Acute respiratory failure with hypoxia (HCC) due to CAP (community acquired pneumonia) He continues to desaturate with ambulation, has remained afebrile with no leukocytosis.  Now Tachcyardic, check a CT angio of chest to rule out PE. Now on Levaquin.    Hypokalemia: Resolved with repletion.  Sinus tachycardia: Sinus tachycardia with ambulation.  Autism    DVT Prophylaxis - Lovenox ordered.  Family Communication: sister Disposition Plan: Home 3 days Code Status:     Code Status Orders        Start     Ordered   02/28/15 1807  Full code   Continuous     02/28/15 1813    Code Status History    Date Active Date Inactive Code Status Order ID Comments User Context   This patient has a current code status but no historical code status.        IV Access:    Peripheral IV   Procedures and diagnostic studies:   Dg Chest 2 View  03/02/2015  CLINICAL DATA:  Dyspnea EXAM: CHEST  2 VIEW COMPARISON:  02/28/2015 chest radiograph. FINDINGS: Low lung volumes. Stable cardiomediastinal silhouette with normal heart size. No pneumothorax. Small bilateral pleural effusions, stable on the right and slightly increased on the left. Extensive patchy consolidation at both lung bases, slightly worsened on the left. No pulmonary edema. IMPRESSION: 1. Low lung volumes. Extensive patchy bibasilar lung consolidation, slightly worsened on the left, favor multifocal pneumonia. 2. Small bilateral pleural effusions, stable on the right and increased on the left. Electronically Signed   By: Delbert Phenix M.D.   On: 03/02/2015 09:27     Medical Consultants:    None.  Anti-Infectives:    Anti-infectives    Start     Dose/Rate Route Frequency Ordered Stop   03/02/15 1800  levofloxacin (LEVAQUIN) tablet 750 mg     750 mg Oral Daily-1800 03/02/15 1707     03/02/15 1000  azithromycin (ZITHROMAX) tablet 500 mg  Status:  Discontinued     500 mg Oral Daily 03/01/15 1133 03/02/15 1708   03/01/15 1200  cefTRIAXone (ROCEPHIN) 1 g in dextrose 5 % 50 mL IVPB  Status:  Discontinued     1 g 100 mL/hr over 30 Minutes Intravenous Every 24 hours 02/28/15 1813 03/02/15 1708   03/01/15 1200  azithromycin (ZITHROMAX) 500 mg in dextrose 5 % 250 mL IVPB  Status:  Discontinued     500 mg 250 mL/hr over 60 Minutes Intravenous Every 24 hours 02/28/15 1813 03/01/15 1133   02/28/15 2200  oseltamivir (TAMIFLU) capsule 75 mg  Status:  Discontinued     75 mg Oral 2 times daily 02/28/15 1813 03/01/15 0754   02/28/15 1130  cefTRIAXone (ROCEPHIN) 1 g in dextrose 5 % 50 mL IVPB     1 g 100 mL/hr over 30 Minutes Intravenous  Once 02/28/15 1115 02/28/15 1155   02/28/15 1130  azithromycin (ZITHROMAX) 500 mg in dextrose 5 % 250 mL IVPB     500 mg 250 mL/hr over 60 Minutes Intravenous  Once 02/28/15 1115 02/28/15 1505   02/28/15 1124  azithromycin (ZITHROMAX) 500 MG injection    Comments:  Harmon Pier   :  cabinet override      02/28/15 1124 02/28/15 2329      Subjective:    Juan Cameron cont to desaturate.  Objective:    Filed Vitals:   03/03/15 0320 03/03/15 0337 03/03/15 0500 03/03/15 0502  BP: 161/108  141/93   Pulse: 111 114 115   Temp:   99.6 F (37.6 C)   TempSrc:   Oral   Resp: Height:      Weight:      SpO2:  92% 88% 90%    Intake/Output Summary (Last 24 hours) at 03/03/15 1221 Last data filed at 03/02/15 2007  Gross per 24 hour  Intake    702 ml  Output      0 ml  Net    702 ml   Filed Weights   02/28/15 1013 02/28/15 1742  Weight: 107.049 kg (236 lb) 103.5 kg (228 lb 2.8 oz)    Exam: Gen:  NAD Cardiovascular:  RRR, No M/R/G Chest and lungs:    Good air movement with crackles on the right. Abdomen:  Abdomen soft, NT/ND, + BS Extremities:  No cut or ulcers.   Data Reviewed:    Labs: Basic Metabolic Panel:  Recent Labs Lab 02/28/15 1100 02/28/15 1908 03/01/15 0328  NA 137 140 141  K 3.4* 3.5 4.8  CL 102 104 106  CO2 GLUCOSE 96 90 158*  BUN 8 5* 5*  CREATININE 1.02 1.00 1.01  CALCIUM 8.3* 8.6* 9.0   GFR Estimated Creatinine Clearance: 118.7 mL/min (by C-G formula based on Cr of 1.01). Liver Function Tests:  Recent Labs Lab 02/28/15 1100 02/28/15 1908 03/01/15 0328  AST ALT ALKPHOS 56 56 57  BILITOT 0.7 0.5 0.5  PROT 7.3 7.1 7.3  ALBUMIN 3.6 3.3* 3.3*   No results for input(s): LIPASE, AMYLASE in the last 168 hours. No results for input(s): AMMONIA in the last 168 hours. Coagulation profile  Recent Labs Lab 02/28/15 1908  INR 1.01    CBC:  Recent Labs Lab 02/28/15 1100 02/28/15 1908 03/01/15 0328  WBC 8.8 7.7 8.0  NEUTROABS 5.9 4.8  --   HGB 13.7 14.1 14.3  HCT 41.5 41.4 42.8  MCV 78.7 78.9 79.6  PLT 229 219 249   Cardiac Enzymes: No results for input(s): CKTOTAL, CKMB, CKMBINDEX, TROPONINI in the last 168 hours. BNP (last 3 results) No results for input(s): PROBNP in the last 8760 hours. CBG: No results for input(s): GLUCAP in the last 168 hours. D-Dimer: No results for input(s): DDIMER in the last 72 hours. Hgb A1c: No results for input(s): HGBA1C in the last 72 hours. Lipid Profile: No results for input(s): CHOL, HDL, LDLCALC, TRIG, CHOLHDL, LDLDIRECT in the last 72 hours. Thyroid function studies: No results for input(s): TSH, T4TOTAL, T3FREE, THYROIDAB in the last 72 hours.  Invalid input(s): FREET3 Anemia work up: No results for input(s): VITAMINB12, FOLATE, FERRITIN, TIBC, IRON, RETICCTPCT in the last 72 hours. Sepsis Labs:  Recent Labs Lab 02/28/15 1100 02/28/15 1110 02/28/15 1908 02/28/15 2200 03/01/15 0328  PROCALCITON  --   --   <0.10  --   --   WBC 8.8  --  7.7  --  8.0  LATICACIDVEN  --  1.19 1.1 1.5  --    Microbiology Recent Results (from the past 240 hour(s))  Rapid strep screen     Status: None   Collection Time: 02/28/15 10:30 AM  Result Value Ref Range Status   Streptococcus, Group A Screen (Direct) NEGATIVE NEGATIVE Final    Comment: (NOTE) A Rapid Antigen test may result negative if the antigen level in the sample is below the detection level of this test. The FDA has not cleared this test as a stand-alone test therefore the rapid antigen negative result has reflexed to a Group A Strep culture.   Culture, group A strep     Status: None (Preliminary result)   Collection Time: 02/28/15 10:30 AM  Result Value Ref Range Status   Specimen Description THROAT  Final   Special Requests NONE Reflexed from [id number]  Final   Culture   Final    CULTURE REINCUBATED FOR BETTER GROWTH Performed at [facility]    Report Status PENDING  Incomplete  Culture, blood (routine x 2)     Status: None (Preliminary result)   Collection Time: 02/28/15 11:00 AM  Result Value Ref Range Status   Specimen Description BLOOD RIGHT ARM  Final   Special Requests BOTTLES DRAWN AEROBIC AND ANAEROBIC 5CC  Final   Culture   Final    NO GROWTH 2 DAYS Performed at [facility]    Report Status PENDING  Incomplete  Culture, blood (routine x 2) Call MD if unable to obtain prior to antibiotics being given     Status: None (Preliminary result)   Collection Time: 02/28/15  7:08 PM  Result Value Ref Range Status   Specimen Description BLOOD LEFT ANTECUBITAL  Final   Special Requests BOTTLES DRAWN AEROBIC AND ANAEROBIC 5CC  Final   Culture NO GROWTH 2 DAYS  Final   Report Status PENDING  Incomplete  Culture, blood (x 2)     Status: None (Preliminary result)   Collection Time: 02/28/15  7:30 PM  Result Value Ref Range Status   Specimen Description BLOOD LEFT ANTECUBITAL  Final   Special Requests BOTTLES DRAWN AEROBIC  AND ANAEROBIC 5CC  Final   Culture NO GROWTH 2 DAYS  Final   Report Status PENDING  Incomplete     Medications:   . albuterol  2.5 mg Nebulization BID  . antiseptic oral rinse  7 mL Mouth Rinse BID  . heparin  5,000 Units Subcutaneous 3 times per day  . levofloxacin  750 mg Oral q1800  . predniSONE  50 mg Oral Q breakfast  . sodium chloride flush  3 mL Intravenous Q12H   Continuous Infusions:    Time spent: 15 min   LOS: 3 days   Marinda Elk  [facility] Pager [phone]  *Please refer to [email or web address], password [email or web address] to get updated schedule on who will round on this patient, as hospitalists switch teams weekly. If 7PM-7AM, please contact night-coverage at [email or web address], password [email or web address] for any overnight needs.  03/03/2015, 12:21 PM

## 2015-03-03 NOTE — Care Management Important Message (Signed)
Important Message  Patient Details  Name: Prestin Munch MRN: [id number] Date of Birth: 1975-07-10   Medicare Important Message Given:  Yes    Bernadette Hoit 03/03/2015, 11:06 AM

## 2015-03-04 MED ORDER — KETOROLAC TROMETHAMINE 30 MG/ML IJ SOLN
30.0000 mg | Freq: Once | INTRAMUSCULAR | Status: AC
  Administered 2015-03-04: 30 mg via INTRAVENOUS
  Filled 2015-03-04: qty 1

## 2015-03-04 MED ORDER — ALBUTEROL SULFATE (2.5 MG/3ML) 0.083% IN NEBU
2.5000 mg | INHALATION_SOLUTION | Freq: Four times a day (QID) | RESPIRATORY_TRACT | Status: DC
  Administered 2015-03-04: 2.5 mg via RESPIRATORY_TRACT
  Filled 2015-03-04: qty 3

## 2015-03-04 NOTE — Progress Notes (Signed)
SATURATION QUALIFICATIONS: (This note is used to comply with regulatory documentation for home oxygen)  Patient Saturations on Room Air at Rest = 87%  Patient Saturations on Room Air while Ambulating = 80%  Patient Saturations on 4 Liters of oxygen while Ambulating = 90%  Please briefly explain why patient needs home oxygen:

## 2015-03-04 NOTE — Progress Notes (Signed)
[facility] PROGRESS NOTE    Progress Note   Shawndell Schillaci ZOX:[id number] DOB: 1975-05-15 DOA: 02/28/2015 PCP: Ananias Pilgrim, MD   Brief Narrative:   Juan Cameron is an 40 y.o. male the past medical history of autism that comes in for hypoxia and diagnosed with pneumonia.  Assessment/Plan:   Acute respiratory failure with hypoxia (HCC) due to CAP (community acquired pneumonia) Has remained afebrile with no leukocytosis.  Now Tachcyardic, check a CT angio of chest to rule out PE, shows no pain with multifocal pneumonia. Continues to desat significantly with relation, will recheck tomorrow morning. There is no wheezing on physical exam. Now on Levaquin.    Hypokalemia: Resolved with repletion.  Sinus tachycardia: Sinus tachycardia with ambulation.  Autism    DVT Prophylaxis - Lovenox ordered.  Family Communication: sister Disposition Plan: Home 1 days Code Status:     Code Status Orders        Start     Ordered   02/28/15 1807  Full code   Continuous     02/28/15 1813    Code Status History    Date Active Date Inactive Code Status Order ID Comments User Context   This patient has a current code status but no historical code status.        IV Access:    Peripheral IV   Procedures and diagnostic studies:   Ct Angio Chest Pe W/cm &/or Wo Cm  03/03/2015  CLINICAL DATA:  40 year old male with shortness of breath and chest pain. EXAM: CT ANGIOGRAPHY CHEST WITH CONTRAST recommended. TECHNIQUE: Multidetector CT imaging of the chest was performed using the standard protocol during bolus administration of intravenous contrast. Multiplanar CT image reconstructions and MIPs were obtained to evaluate the vascular anatomy. CONTRAST:  80mL OMNIPAQUE IOHEXOL 350 MG/ML SOLN COMPARISON:  Radiograph dated 03/02/2015 FINDINGS: There are bilateral patchy areas of airspace consolidation with air bronchogram predominantly involving the lung bases most compatible with  multifocal pneumonia. Multiple ground-glass and nodular densities noted in the left apical region. There is no pleural effusion or pneumothorax. The central airways are patent. The thoracic aorta appears unremarkable. Scratch no CT evidence of pulmonary embolism. Top-normal cardiac size. No pericardial effusion. Subcarinal lymph node measures 14 mm in short axis. The esophagus is grossly unremarkable. No thyroid nodule identified. There is no axillary adenopathy. The chest wall soft tissues appear unremarkable. The osseous structures are intact. The visualized upper abdomen appear unremarkable Review of the MIP images confirms the above findings. IMPRESSION: No CT evidence of pulmonary embolism. Multi focal pneumonia. Correlation with clinical exam and patient's symptomatology recommended to exclude acute chest syndrome. Follow-up recommended. Electronically Signed   By: Elgie Collard M.D.   On: 03/03/2015 16:12     Medical Consultants:    None.  Anti-Infectives:   Anti-infectives    Start     Dose/Rate Route Frequency Ordered Stop   03/02/15 1800  levofloxacin (LEVAQUIN) tablet 750 mg     750 mg Oral Daily-1800 03/02/15 1707     03/02/15 1000  azithromycin (ZITHROMAX) tablet 500 mg  Status:  Discontinued     500 mg Oral Daily 03/01/15 1133 03/02/15 1708   03/01/15 1200  cefTRIAXone (ROCEPHIN) 1 g in dextrose 5 % 50 mL IVPB  Status:  Discontinued     1 g 100 mL/hr over 30 Minutes Intravenous Every 24 hours 02/28/15 1813 03/02/15 1708   03/01/15 1200  azithromycin (ZITHROMAX) 500 mg in dextrose 5 % 250 mL IVPB  Status:  Discontinued  500 mg 250 mL/hr over 60 Minutes Intravenous Every 24 hours 02/28/15 1813 03/01/15 1133   02/28/15 2200  oseltamivir (TAMIFLU) capsule 75 mg  Status:  Discontinued     75 mg Oral 2 times daily 02/28/15 1813 03/01/15 0754   02/28/15 1130  cefTRIAXone (ROCEPHIN) 1 g in dextrose 5 % 50 mL IVPB     1 g 100 mL/hr over 30 Minutes Intravenous  Once 02/28/15 1115  02/28/15 1155   02/28/15 1130  azithromycin (ZITHROMAX) 500 mg in dextrose 5 % 250 mL IVPB     500 mg 250 mL/hr over 60 Minutes Intravenous  Once 02/28/15 1115 02/28/15 1505   02/28/15 1124  azithromycin (ZITHROMAX) 500 MG injection    Comments:  [place]   : cabinet override      02/28/15 1124 02/28/15 2329      Subjective:    [name] cont to desaturate with ambulation into the 60s and 70s.  Objective:    Filed Vitals:   03/03/15 1516 03/03/15 2147 03/04/15 0500 03/04/15 0812  BP: 109/66 138/79 130/82   Pulse: 100 117 108   Temp: 98.6 F (37 C) 98.7 F (37.1 C) 98.3 F (36.8 C)   TempSrc: Oral Oral Oral   Resp: Height:      Weight:      SpO2: 92% 92% 94% 94%   No intake or output data in the 24 hours ending 03/04/15 1307 Filed Weights   02/28/15 1013 02/28/15 1742  Weight: 107.049 kg (236 lb) 103.5 kg (228 lb 2.8 oz)    Exam: Gen:  NAD Cardiovascular:  RRR, No M/R/G Chest and lungs:   Good air movement with crackles on the right. Abdomen:  Abdomen soft, NT/ND, + BS Extremities:  No cut or ulcers.   Data Reviewed:    Labs: Basic Metabolic Panel:  Recent Labs Lab 02/28/15 1100 02/28/15 1908 03/01/15 0328  NA 137 140 141  K 3.4* 3.5 4.8  CL 102 104 106  CO2 GLUCOSE 96 90 158*  BUN 8 5* 5*  CREATININE 1.02 1.00 1.01  CALCIUM 8.3* 8.6* 9.0   GFR Estimated Creatinine Clearance: 118.7 mL/min (by C-G formula based on Cr of 1.01). Liver Function Tests:  Recent Labs Lab 02/28/15 1100 02/28/15 1908 03/01/15 0328  AST ALT ALKPHOS 56 56 57  BILITOT 0.7 0.5 0.5  PROT 7.3 7.1 7.3  ALBUMIN 3.6 3.3* 3.3*   No results for input(s): LIPASE, AMYLASE in the last 168 hours. No results for input(s): AMMONIA in the last 168 hours. Coagulation profile  Recent Labs Lab 02/28/15 1908  INR 1.01    CBC:  Recent Labs Lab 02/28/15 1100 02/28/15 1908 03/01/15 0328  WBC 8.8 7.7 8.0  NEUTROABS  5.9 4.8  --   HGB 13.7 14.1 14.3  HCT 41.5 41.4 42.8  MCV 78.7 78.9 79.6  PLT 229 219 249   Cardiac Enzymes: No results for input(s): CKTOTAL, CKMB, CKMBINDEX, TROPONINI in the last 168 hours. BNP (last 3 results) No results for input(s): PROBNP in the last 8760 hours. CBG: No results for input(s): GLUCAP in the last 168 hours. D-Dimer: No results for input(s): DDIMER in the last 72 hours. Hgb A1c: No results for input(s): HGBA1C in the last 72 hours. Lipid Profile: No results for input(s): CHOL, HDL, LDLCALC, TRIG, CHOLHDL, LDLDIRECT in the last 72 hours. Thyroid function studies: No results for input(s):  TSH, T4TOTAL, T3FREE, THYROIDAB in the last 72 hours.  Invalid input(s): FREET3 Anemia work up: No results for input(s): VITAMINB12, FOLATE, FERRITIN, TIBC, IRON, RETICCTPCT in the last 72 hours. Sepsis Labs:  Recent Labs Lab 02/28/15 1100 02/28/15 1110 02/28/15 1908 02/28/15 2200 03/01/15 0328  PROCALCITON  --   --  <0.10  --   --   WBC 8.8  --  7.7  --  8.0  LATICACIDVEN  --  1.19 1.1 1.5  --    Microbiology Recent Results (from the past 240 hour(s))  Rapid strep screen     Status: None   Collection Time: 02/28/15 10:30 AM  Result Value Ref Range Status   Streptococcus, Group A Screen (Direct) NEGATIVE NEGATIVE Final    Comment: (NOTE) A Rapid Antigen test may result negative if the antigen level in the sample is below the detection level of this test. The FDA has not cleared this test as a stand-alone test therefore the rapid antigen negative result has reflexed to a Group A Strep culture.   Culture, group A strep     Status: None   Collection Time: 02/28/15 10:30 AM  Result Value Ref Range Status   Specimen Description THROAT  Final   Special Requests NONE Reflexed from [id number]  Final   Culture   Final    NO GROUP A STREP (S.PYOGENES) ISOLATED Performed at [facility]    Report Status 03/03/2015 FINAL  Final  Culture, blood (routine x 2)      Status: None (Preliminary result)   Collection Time: 02/28/15 11:00 AM  Result Value Ref Range Status   Specimen Description BLOOD RIGHT ARM  Final   Special Requests BOTTLES DRAWN AEROBIC AND ANAEROBIC 5CC  Final   Culture   Final    NO GROWTH 3 DAYS Performed at [facility]    Report Status PENDING  Incomplete  Culture, blood (routine x 2) Call MD if unable to obtain prior to antibiotics being given     Status: None (Preliminary result)   Collection Time: 02/28/15  7:08 PM  Result Value Ref Range Status   Specimen Description BLOOD LEFT ANTECUBITAL  Final   Special Requests BOTTLES DRAWN AEROBIC AND ANAEROBIC 5CC  Final   Culture NO GROWTH 3 DAYS  Final   Report Status PENDING  Incomplete  Culture, blood (x 2)     Status: None (Preliminary result)   Collection Time: 02/28/15  7:30 PM  Result Value Ref Range Status   Specimen Description BLOOD LEFT ANTECUBITAL  Final   Special Requests BOTTLES DRAWN AEROBIC AND ANAEROBIC 5CC  Final   Culture NO GROWTH 3 DAYS  Final   Report Status PENDING  Incomplete     Medications:   . albuterol  2.5 mg Nebulization QID  . antiseptic oral rinse  7 mL Mouth Rinse BID  . heparin  5,000 Units Subcutaneous 3 times per day  . levofloxacin  750 mg Oral q1800  . predniSONE  50 mg Oral Q breakfast  . sodium chloride flush  3 mL Intravenous Q12H   Continuous Infusions:    Time spent: 15 min   LOS: 4 days   Marinda Elk  [facility] Pager [phone]  *Please refer to [email or web address], password [email or web address] to get updated schedule on who will round on this patient, as hospitalists switch teams weekly. If 7PM-7AM, please contact night-coverage at [email or web address], password [email or web address] for any overnight needs.  03/04/2015, 1:07 PM

## 2015-03-04 NOTE — Progress Notes (Signed)
Utilization review completed.  

## 2015-03-05 ENCOUNTER — Inpatient Hospital Stay (HOSPITAL_COMMUNITY): Payer: Medicare Other

## 2015-03-05 LAB — CULTURE, BLOOD (ROUTINE X 2)
CULTURE: NO GROWTH
Culture: NO GROWTH
Culture: NO GROWTH

## 2015-03-05 MED ORDER — BUDESONIDE 0.25 MG/2ML IN SUSP
0.2500 mg | Freq: Two times a day (BID) | RESPIRATORY_TRACT | Status: DC
  Administered 2015-03-05 – 2015-03-10 (×10): 0.25 mg via RESPIRATORY_TRACT
  Filled 2015-03-05 (×10): qty 2

## 2015-03-05 MED ORDER — LEVALBUTEROL HCL 0.63 MG/3ML IN NEBU: 0.6300 mg | INHALATION_SOLUTION | Freq: Three times a day (TID) | RESPIRATORY_TRACT | Status: DC

## 2015-03-05 MED ORDER — IPRATROPIUM BROMIDE 0.02 % IN SOLN: 0.5000 mg | Freq: Three times a day (TID) | RESPIRATORY_TRACT | Status: DC

## 2015-03-05 MED ORDER — LEVALBUTEROL HCL 0.63 MG/3ML IN NEBU
0.6300 mg | INHALATION_SOLUTION | Freq: Three times a day (TID) | RESPIRATORY_TRACT | Status: DC
  Administered 2015-03-05 – 2015-03-07 (×6): 0.63 mg via RESPIRATORY_TRACT
  Filled 2015-03-05 (×6): qty 3

## 2015-03-05 MED ORDER — IPRATROPIUM BROMIDE 0.02 % IN SOLN
0.5000 mg | Freq: Three times a day (TID) | RESPIRATORY_TRACT | Status: DC
  Administered 2015-03-05 – 2015-03-07 (×6): 0.5 mg via RESPIRATORY_TRACT
  Filled 2015-03-05 (×6): qty 2.5

## 2015-03-05 NOTE — Progress Notes (Signed)
Pt ambulated roughly 470ft in hall without assistance.  His O2 sats remained around 89% on 4L of O2.  When we were about 97ft from his room, sats fell to 79%, pt responded that he felt tired at that point.  We returned safely to room.  Pt agreed to sit up in recliner for a while.  O2 sats up to 91% on 5LNC.

## 2015-03-05 NOTE — Progress Notes (Signed)
SATURATION QUALIFICATIONS: (This note is used to comply with regulatory documentation for home oxygen)  Patient Saturations on Room Air at Rest = 89%  Patient Saturations on Room Air while Ambulating = not attempted, per MD, due to decreased resting RA sats  Patient Saturations on 4 Liters of oxygen while Ambulating = 89%  Please briefly explain why patient needs home oxygen:

## 2015-03-05 NOTE — Progress Notes (Signed)
PROGRESS NOTE  Juan Cameron ZOX:[id number] DOB: [birth year] DOA: 02/28/2015 PCP: Ananias Pilgrim, MD Outpatient Specialists:    LOS: 5 days   Brief Narrative: Juan Cameron is a 40 year old autistic male who was admitted on 2/17 with hypoxia and multifocal CAP. Currently taking Levaquin PO. Condition is improving.  Subjective: Patient is friendly, sitting on bedside. Appears alert, in NAD. Conversational, lucid. Reports no SOB, CP. Reports non-productive cough. No increased WOB noted.   Assessment & Plan:  Acute respiratory failure with hypoxia due to CAP  - Symptoms improving. Has remained afebrile with no leukocytosis.  - Pt continues to desaturate with ambulation - O2 sat: Pre-ambulatory on RA: 86%; Ambulatory on/4LNC: 79-89%; Post-ambulatory on 5LNC: 91%. Patient symptomatic with tiredness - Repeated CXR this morning: low lung volumes as before; patchy infiltrates (L>R) improved from CXR on 02/19; w/?BL pleural effusion.  - CT angio negative for PE on 2/21 - No prior history of lung disease. He is on albuterol though, and there was a question about asthma. Sister denies. We'll broaden his respiratory treatment. Was started on prednisone empirically, continue - Continue Levaquin him a plan for 8 days total treatment length. Today is day 6  Hypokalemia - Resolved.  Autism - outpatient follow-up   DVT prophylaxis: Heparin Code Status: Full Family Communication: Discussed with the patient's sister over the phone [phone]) on 2/22  Disposition Plan: Remain inpatient, Home when ready Barrier to discharge: Hypoxia  Consultants: None  Procedures: None  Antimicrobials: Azithromycin 2/17 >> 2/19 Ceftriaxone 2/17 >> 2/19  Levofloxacin 2/19 >>  Objective: on 3.5-4LNC  Filed Vitals:   03/04/15 0812 03/04/15 1418 03/04/15 2129 03/05/15 0623  BP:  127/80 121/76 119/76  Pulse:  125 107 96  Temp:  100.9 F (38.3 C) 98.4 F (36.9 C) 97.7 F (36.5 C)  TempSrc:  Oral Oral  Oral  Resp:  Height:      Weight:      SpO2: 94% 92% 90% 94%    Intake/Output Summary (Last 24 hours) at 03/05/15 1352 Last data filed at 03/05/15 0830  Gross per 24 hour  Intake    240 ml  Output      0 ml  Net    240 ml   Filed Weights   02/28/15 1013 02/28/15 1742  Weight: 107.049 kg (236 lb) 103.5 kg (228 lb 2.8 oz)    Examination: BP 119/76 mmHg  Pulse 96  Temp(Src) 97.7 F (36.5 C) (Oral)  Resp 18  Ht  (1.753 m)  Wt 103.5 kg (228 lb 2.8 oz)  BMI 33.68 kg/m2  SpO2 94% General exam: NAD; Alert, appropriately responsive in general. Respiratory system: CTABL. No increased work of breathing. No adventitious breath sounds. Cardiovascular system: regular rate and rhythm, no murmurs, gallops. No JVD. No peripheral edema.  Gastrointestinal system: Abdomen is nondistended, soft and nontender. Normal bowel sounds heard. Central nervous system: Alert. No focal deficits Extremities: No clubbing/cyanosis Skin: no rashes  Data Reviewed: I have personally reviewed following labs and imaging studies  CBC:  Recent Labs Lab 02/28/15 1100 02/28/15 1908 03/01/15 0328  WBC 8.8 7.7 8.0  NEUTROABS 5.9 4.8  --   HGB 13.7 14.1 14.3  HCT 41.5 41.4 42.8  MCV 78.7 78.9 79.6  PLT 229 219 249   Basic Metabolic Panel:   Recent Labs Lab 02/28/15 1100 02/28/15 1908 03/01/15 0328  NA 137 140 141  K 3.4* 3.5 4.8  CL 102 104 106  CO2 27 26  27  GLUCOSE 96 90 158*  BUN 8 5* 5*  CREATININE 1.02 1.00 1.01  CALCIUM 8.3* 8.6* 9.0   GFR: Estimated Creatinine Clearance: 118.7 mL/min (by C-G formula based on Cr of 1.01). Liver Function Tests:  Recent Labs Lab 02/28/15 1100 02/28/15 1908 03/01/15 0328  AST ALT ALKPHOS 56 56 57  BILITOT 0.7 0.5 0.5  PROT 7.3 7.1 7.3  ALBUMIN 3.6 3.3* 3.3*   No results for input(s): LIPASE, AMYLASE in the last 168 hours. No results for input(s): AMMONIA in the last 168 hours. Coagulation  Profile:  Recent Labs Lab 02/28/15 1908  INR 1.01   Cardiac Enzymes: No results for input(s): CKTOTAL, CKMB, CKMBINDEX, TROPONINI in the last 168 hours. BNP (last 3 results) No results for input(s): PROBNP in the last 8760 hours. HbA1C: No results for input(s): HGBA1C in the last 72 hours. CBG: No results for input(s): GLUCAP in the last 168 hours. Lipid Profile: No results for input(s): CHOL, HDL, LDLCALC, TRIG, CHOLHDL, LDLDIRECT in the last 72 hours. Thyroid Function Tests: No results for input(s): TSH, T4TOTAL, FREET4, T3FREE, THYROIDAB in the last 72 hours. Anemia Panel: No results for input(s): VITAMINB12, FOLATE, FERRITIN, TIBC, IRON, RETICCTPCT in the last 72 hours. Urine analysis: No results found for: COLORURINE, APPEARANCEUR, LABSPEC, PHURINE, GLUCOSEU, HGBUR, BILIRUBINUR, KETONESUR, PROTEINUR, UROBILINOGEN, NITRITE, LEUKOCYTESUR   Recent Results (from the past 240 hour(s))  Rapid strep screen     Status: None   Collection Time: 02/28/15 10:30 AM  Result Value Ref Range Status   Streptococcus, Group A Screen (Direct) NEGATIVE NEGATIVE Final    Comment: (NOTE) A Rapid Antigen test may result negative if the antigen level in the sample is below the detection level of this test. The FDA has not cleared this test as a stand-alone test therefore the rapid antigen negative result has reflexed to a Group A Strep culture.   Culture, group A strep     Status: None   Collection Time: 02/28/15 10:30 AM  Result Value Ref Range Status   Specimen Description THROAT  Final   Special Requests NONE Reflexed from [id number]  Final   Culture   Final    NO GROUP A STREP (S.PYOGENES) ISOLATED Performed at [facility]    Report Status 03/03/2015 FINAL  Final  Culture, blood (routine x 2)     Status: None   Collection Time: 02/28/15 11:00 AM  Result Value Ref Range Status   Specimen Description BLOOD RIGHT ARM  Final   Special Requests BOTTLES DRAWN AEROBIC AND ANAEROBIC 5CC   Final   Culture   Final    NO GROWTH 5 DAYS Performed at [facility]    Report Status 03/05/2015 FINAL  Final  Culture, blood (routine x 2) Call MD if unable to obtain prior to antibiotics being given     Status: None   Collection Time: 02/28/15  7:08 PM  Result Value Ref Range Status   Specimen Description BLOOD LEFT ANTECUBITAL  Final   Special Requests BOTTLES DRAWN AEROBIC AND ANAEROBIC 5CC  Final   Culture NO GROWTH 5 DAYS  Final   Report Status 03/05/2015 FINAL  Final  Culture, blood (x 2)     Status: None   Collection Time: 02/28/15  7:30 PM  Result Value Ref Range Status   Specimen Description BLOOD LEFT ANTECUBITAL  Final   Special Requests BOTTLES DRAWN AEROBIC AND ANAEROBIC 5CC  Final   Culture  NO GROWTH 5 DAYS  Final   Report Status 03/05/2015 FINAL  Final      Radiology Studies: Dg Chest 2 View  03/05/2015  CLINICAL DATA:  Hypoxia, cough, shortness of breath, fever EXAM: CHEST  2 VIEW COMPARISON:  03/03/2015 FINDINGS: Cardiomediastinal silhouette is stable. No pulmonary edema. Persistent patchy bilateral lower lobe infiltrates left greater than right. Question tiny bilateral pleural effusion. IMPRESSION: Persistent patchy bilateral lower lobe infiltrates left greater than right. Question tiny bilateral pleural effusion. Electronically Signed   By: Natasha Mead M.D.   On: 03/05/2015 13:04   Ct Angio Chest Pe W/cm &/or Wo Cm  03/03/2015  CLINICAL DATA:  40 year old male with shortness of breath and chest pain. EXAM: CT ANGIOGRAPHY CHEST WITH CONTRAST recommended. TECHNIQUE: Multidetector CT imaging of the chest was performed using the standard protocol during bolus administration of intravenous contrast. Multiplanar CT image reconstructions and MIPs were obtained to evaluate the vascular anatomy. CONTRAST:  80mL OMNIPAQUE IOHEXOL 350 MG/ML SOLN COMPARISON:  Radiograph dated 03/02/2015 FINDINGS: There are bilateral patchy areas of airspace consolidation with air  bronchogram predominantly involving the lung bases most compatible with multifocal pneumonia. Multiple ground-glass and nodular densities noted in the left apical region. There is no pleural effusion or pneumothorax. The central airways are patent. The thoracic aorta appears unremarkable. Scratch no CT evidence of pulmonary embolism. Top-normal cardiac size. No pericardial effusion. Subcarinal lymph node measures 14 mm in short axis. The esophagus is grossly unremarkable. No thyroid nodule identified. There is no axillary adenopathy. The chest wall soft tissues appear unremarkable. The osseous structures are intact. The visualized upper abdomen appear unremarkable Review of the MIP images confirms the above findings. IMPRESSION: No CT evidence of pulmonary embolism. Multi focal pneumonia. Correlation with clinical exam and patient's symptomatology recommended to exclude acute chest syndrome. Follow-up recommended. Electronically Signed   By: Elgie Collard M.D.   On: 03/03/2015 16:12     Scheduled Meds: . antiseptic oral rinse  7 mL Mouth Rinse BID  . heparin  5,000 Units Subcutaneous 3 times per day  . levofloxacin  750 mg Oral q1800  . predniSONE  50 mg Oral Q breakfast  . sodium chloride flush  3 mL Intravenous Q12H   Continuous Infusions: None.   Fain Francis M. Elvera Lennox, MD, PhD [facility] [phone]  [facility] Pager [phone]  If 7PM-7AM, please contact night-coverage [email or web address] Password [email or web address] 03/05/2015, 1:52 PM

## 2015-03-06 NOTE — Progress Notes (Signed)
PROGRESS NOTE  Juan Cameron NWG:[id number] DOB: 01-Jan-1976 DOA: 02/28/2015 PCP: Ananias Pilgrim, MD Outpatient Specialists:    LOS: 6 days   Brief Narrative: Juan Cameron is a 40 year old autistic male who was admitted on 2/17 with hypoxia and multifocal CAP. Currently taking Levaquin PO. Condition is improving.  Assessment & Plan: Active Problems:   Acute respiratory failure with hypoxia (HCC)   CAP (community acquired pneumonia)   Hypokalemia   Autism  Acute respiratory failure with hypoxia due to CAP  - Symptoms improving. Has remained afebrile with no leukocytosis.  - Pt desaturated with ambulation yesterday. Awaiting ambulatory sats for today. - Repeated CXR 02/22: low lung volumes; patchy infiltrates (L>R) improved from CXR on 2/19; w/?BL pleural effusion.  - CT angio negative for PE on 2/21 - No prior history of lung disease. He is on albuterol though, and there was a question about asthma. Sister denies. Started on Pulmicort BID, Levalbuterol TID on 2/22. Was started on prednisone empirically 2/19, continue. - Continue Levaquin him a plan for 8 days total treatment length. Today is day 7.  Hypokalemia - Resolved.  Autism - Outpatient follow-up   DVT prophylaxis: Heparin Code Status: Full Family Communication: Discussed with the patient's sister over the phone [phone] on 2/22  Disposition Plan: Remain inpatient; Home when ready. Barrier to discharge: Hypoxia  Consultants: None  Procedures: None  Antimicrobials: Azithromycin 2/17 >> 2/19 Ceftriaxone 2/17 >> 2/19  Levofloxacin 2/19 >>  Subjective: Patient is friendly, supine. Appears alert, in NAD. Follows commands well. Conversational. Says his breathing is "good", but this is his response to most questions about symptoms. Reports no SOB, CP. Reports non-productive cough. No increased WOB noted.   Objective: Filed Vitals:   03/05/15 2250 03/06/15 0604 03/06/15 0711 03/06/15 0731  BP: 110/55 120/63     Pulse: 66 96    Temp: 99.2 F (37.3 C) 98.3 F (36.8 C)    TempSrc: Oral Oral    Resp: 20 20    Height:      Weight:      SpO2: 96% 93% 96% 95%    Intake/Output Summary (Last 24 hours) at 03/06/15 1223 Last data filed at 03/06/15 0900  Gross per 24 hour  Intake    240 ml  Output      0 ml  Net    240 ml   Filed Weights   02/28/15 1013 02/28/15 1742  Weight: 107.049 kg (236 lb) 103.5 kg (228 lb 2.8 oz)    Examination: BP 120/63 mmHg  Pulse 96  Temp(Src) 98.3 F (36.8 C) (Oral)  Resp 20  Ht  (1.753 m)  Wt 103.5 kg (228 lb 2.8 oz)  BMI 33.68 kg/m2  SpO2 95% General exam: NAD Respiratory system: Clear. No increased work of breathing. No wheezing, no crackles Cardiovascular system: regular rate and rhythm, no murmurs, gallops. No JVD. No peripheral edema.  Gastrointestinal system: Abdomen is nondistended, soft and nontender. Normal bowel sounds heard. Central nervous system: AxOx3. No focal deficits Extremities: No clubbing/cyanosis Skin: no rashes   Data Reviewed: I have personally reviewed following labs and imaging studies  CBC:  Recent Labs Lab 02/28/15 1100 02/28/15 1908 03/01/15 0328  WBC 8.8 7.7 8.0  NEUTROABS 5.9 4.8  --   HGB 13.7 14.1 14.3  HCT 41.5 41.4 42.8  MCV 78.7 78.9 79.6  PLT 229 219 249   Basic Metabolic Panel:  Recent Labs Lab 02/28/15 1100 02/28/15 1908 03/01/15 0328  NA 137 140 141  K 3.4* 3.5 4.8  CL 102 104 106  CO2 27 26 27   GLUCOSE 96 90 158*  BUN 8 5* 5*  CREATININE 1.02 1.00 1.01  CALCIUM 8.3* 8.6* 9.0   GFR: Estimated Creatinine Clearance: 118.7 mL/min (by C-G formula based on Cr of 1.01). Liver Function Tests:  Recent Labs Lab 02/28/15 1100 02/28/15 1908 03/01/15 0328  AST 31 30 30   ALT 19 18 21   ALKPHOS 56 56 57  BILITOT 0.7 0.5 0.5  PROT 7.3 7.1 7.3  ALBUMIN 3.6 3.3* 3.3*   No results for input(s): LIPASE, AMYLASE in the last 168 hours. No results for input(s): AMMONIA in the last 168  hours. Coagulation Profile:  Recent Labs Lab 02/28/15 1908  INR 1.01   Cardiac Enzymes: No results for input(s): CKTOTAL, CKMB, CKMBINDEX, TROPONINI in the last 168 hours. BNP (last 3 results) No results for input(s): PROBNP in the last 8760 hours. HbA1C: No results for input(s): HGBA1C in the last 72 hours. CBG: No results for input(s): GLUCAP in the last 168 hours. Lipid Profile: No results for input(s): CHOL, HDL, LDLCALC, TRIG, CHOLHDL, LDLDIRECT in the last 72 hours. Thyroid Function Tests: No results for input(s): TSH, T4TOTAL, FREET4, T3FREE, THYROIDAB in the last 72 hours. Anemia Panel: No results for input(s): VITAMINB12, FOLATE, FERRITIN, TIBC, IRON, RETICCTPCT in the last 72 hours. Urine analysis: No results found for: COLORURINE, APPEARANCEUR, LABSPEC, PHURINE, GLUCOSEU, HGBUR, BILIRUBINUR, KETONESUR, PROTEINUR, UROBILINOGEN, NITRITE, LEUKOCYTESUR Sepsis Labs: Invalid input(s): PROCALCITONIN, LACTICIDVEN  Recent Results (from the past 240 hour(s))  Rapid strep screen     Status: None   Collection Time: 02/28/15 10:30 AM  Result Value Ref Range Status   Streptococcus, Group A Screen (Direct) NEGATIVE NEGATIVE Final    Comment: (NOTE) A Rapid Antigen test may result negative if the antigen level in the sample is below the detection level of this test. The FDA has not cleared this test as a stand-alone test therefore the rapid antigen negative result has reflexed to a Group A Strep culture.   Culture, group A strep     Status: None   Collection Time: 02/28/15 10:30 AM  Result Value Ref Range Status   Specimen Description THROAT  Final   Special Requests NONE Reflexed from [id number]  Final   Culture   Final    NO GROUP A STREP (S.PYOGENES) ISOLATED Performed at [facility]    Report Status 03/03/2015 FINAL  Final  Culture, blood (routine x 2)     Status: None   Collection Time: 02/28/15 11:00 AM  Result Value Ref Range Status   Specimen Description  BLOOD RIGHT ARM  Final   Special Requests BOTTLES DRAWN AEROBIC AND ANAEROBIC 5CC  Final   Culture   Final    NO GROWTH 5 DAYS Performed at [facility]    Report Status 03/05/2015 FINAL  Final  Culture, blood (routine x 2) Call MD if unable to obtain prior to antibiotics being given     Status: None   Collection Time: 02/28/15  7:08 PM  Result Value Ref Range Status   Specimen Description BLOOD LEFT ANTECUBITAL  Final   Special Requests BOTTLES DRAWN AEROBIC AND ANAEROBIC 5CC  Final   Culture NO GROWTH 5 DAYS  Final   Report Status 03/05/2015 FINAL  Final  Culture, blood (x 2)     Status: None   Collection Time: 02/28/15  7:30 PM  Result Value Ref Range Status   Specimen Description BLOOD LEFT  ANTECUBITAL  Final   Special Requests BOTTLES DRAWN AEROBIC AND ANAEROBIC 5CC  Final   Culture NO GROWTH 5 DAYS  Final   Report Status 03/05/2015 FINAL  Final      Radiology Studies: Dg Chest 2 View  03/05/2015  CLINICAL DATA:  Hypoxia, cough, shortness of breath, fever EXAM: CHEST  2 VIEW COMPARISON:  03/03/2015 FINDINGS: Cardiomediastinal silhouette is stable. No pulmonary edema. Persistent patchy bilateral lower lobe infiltrates left greater than right. Question tiny bilateral pleural effusion. IMPRESSION: Persistent patchy bilateral lower lobe infiltrates left greater than right. Question tiny bilateral pleural effusion. Electronically Signed   By: Natasha Mead M.D.   On: 03/05/2015 13:04     Scheduled Meds: . antiseptic oral rinse  7 mL Mouth Rinse BID  . budesonide (PULMICORT) nebulizer solution  0.25 mg Nebulization BID  . heparin  5,000 Units Subcutaneous 3 times per day  . ipratropium  0.5 mg Nebulization TID  . levalbuterol  0.63 mg Nebulization TID  . levofloxacin  750 mg Oral q1800  . predniSONE  50 mg Oral Q breakfast  . sodium chloride flush  3 mL Intravenous Q12H   Continuous Infusions: None.   Pamella Pert, MD, PhD [facility] Pager [phone]  If  7PM-7AM, please contact night-coverage [email or web address] Password [facility] 03/06/2015, 12:23 PM

## 2015-03-06 NOTE — Care Management Note (Signed)
Case Management Note Donn Pierini RN, BSN Unit 2W-Case Manager [phone]  Patient Details  Name: Juan Cameron MRN: [id number] Date of Birth: 07-09-1975  Subjective/Objective:     Pt admitted with resp. Failure with hypoxia- CAP               Action/Plan: PTA pt lived at home- with sister as caregiver- spoke with sister [name] this am via TC- regarding potential d/c needs- sister very anxious regarding pt's continued 02 needs and drops in 02 saturations - explained that home 02 could be arranged with MD order- however pt does not have qualifying dx and insurance may not cover cost- sister voiced understanding and wants what's best for pt -willing to speak with [facility] regarding 02 cost as it will most likely be short term that pt needs it. Pt has an aide that comes once a week- sister open to [facility]- and does not have a preference for agency- pt may benefit for [facility] and PT to watch 02 recovery at home and increase strength/recovery from acute illness. MD please consider HH order for RN/PT. Sister states that she will need a days notice on discharge to make arrangements with work and school to be home with pt and provide transportation. CM to follow for further d/c planning  Expected Discharge Date:                  Expected Discharge Plan:  Home w Home Health Services  In-House Referral:     Discharge planning Services  CM Consult  Post Acute Care Choice:  Home Health Choice offered to:  Sibling  DME Arranged:    DME Agency:     HH Arranged:    HH Agency:  [facility]  Status of Service:  In process, will continue to follow  Medicare Important Message Given:  Yes Date Medicare IM Given:    Medicare IM give by:    Date Additional Medicare IM Given:    Additional Medicare Important Message give by:     If discussed at Long Length of Stay Meetings, dates discussed:    Additional Comments:  Darrold Span, RN 03/06/2015, 11:24 AM

## 2015-03-07 ENCOUNTER — Inpatient Hospital Stay (HOSPITAL_COMMUNITY): Payer: Medicare Other

## 2015-03-07 DIAGNOSIS — E876 Hypokalemia: Secondary | ICD-10-CM

## 2015-03-07 DIAGNOSIS — F840 Autistic disorder: Secondary | ICD-10-CM

## 2015-03-07 DIAGNOSIS — R0689 Other abnormalities of breathing: Secondary | ICD-10-CM

## 2015-03-07 DIAGNOSIS — J189 Pneumonia, unspecified organism: Principal | ICD-10-CM

## 2015-03-07 DIAGNOSIS — J9601 Acute respiratory failure with hypoxia: Secondary | ICD-10-CM

## 2015-03-07 DIAGNOSIS — R0600 Dyspnea, unspecified: Secondary | ICD-10-CM

## 2015-03-07 LAB — BASIC METABOLIC PANEL
ANION GAP: 8 (ref 5–15)
BUN: 18 mg/dL (ref 6–20)
CHLORIDE: 98 mmol/L — AB (ref 101–111)
CO2: 31 mmol/L (ref 22–32)
Calcium: 8.5 mg/dL — ABNORMAL LOW (ref 8.9–10.3)
Creatinine, Ser: 1.11 mg/dL (ref 0.61–1.24)
Glucose, Bld: 101 mg/dL — ABNORMAL HIGH (ref 65–99)
POTASSIUM: 3.9 mmol/L (ref 3.5–5.1)
SODIUM: 137 mmol/L (ref 135–145)

## 2015-03-07 LAB — BRAIN NATRIURETIC PEPTIDE: B NATRIURETIC PEPTIDE 5: 17.2 pg/mL (ref 0.0–100.0)

## 2015-03-07 LAB — PROCALCITONIN: PROCALCITONIN: 0.18 ng/mL

## 2015-03-07 LAB — SEDIMENTATION RATE: SED RATE: 8 mm/h (ref 0–16)

## 2015-03-07 MED ORDER — FUROSEMIDE 10 MG/ML IJ SOLN
40.0000 mg | Freq: Once | INTRAMUSCULAR | Status: AC
  Administered 2015-03-07: 40 mg via INTRAVENOUS
  Filled 2015-03-07: qty 4

## 2015-03-07 MED ORDER — LEVALBUTEROL HCL 0.63 MG/3ML IN NEBU
0.6300 mg | INHALATION_SOLUTION | Freq: Two times a day (BID) | RESPIRATORY_TRACT | Status: DC
  Administered 2015-03-07 – 2015-03-08 (×2): 0.63 mg via RESPIRATORY_TRACT
  Filled 2015-03-07 (×2): qty 3

## 2015-03-07 MED ORDER — IPRATROPIUM BROMIDE 0.02 % IN SOLN
0.5000 mg | Freq: Two times a day (BID) | RESPIRATORY_TRACT | Status: DC
  Administered 2015-03-08: 0.5 mg via RESPIRATORY_TRACT
  Filled 2015-03-07: qty 2.5

## 2015-03-07 NOTE — Consult Note (Signed)
Name: Juan Cameron MRN: [id number] DOB: July 19, 1975    ADMISSION DATE:  02/28/2015 CONSULTATION DATE:  2/24  REFERRING MD :  Cruzita Lederer   CHIEF COMPLAINT:  Dyspnea and wheeze   BRIEF PATIENT DESCRIPTION:  63yom w/ sig h/o autism. Admitted 2/17 w/ CAP. Treated w/ appropriate CAP coverage, as well as empiric steroids and bronchodilator therapy. His WBCs improved, he felt better, but in spite of therapies he continued to desaturate w/ resting O2 sats at 86% on 2 liters. & CXR w/ bilateral L>R airspace disease. PCCM was asked to assess.   SIGNIFICANT EVENTS    STUDIES:  CT chest 2/20: There are bilateral patchy areas of airspace consolidation with air bronchogram predominantly involving the lung bases most compatible with multifocal pneumonia. Multiple ground-glass and nodular densities noted in the left apical region. There is no pleural effusion or pneumothorax. The central airways are patent.  Culture data  BCX2 2/17: neg U strep 2/17: negative  Abx:  azithro 2/17>>>2/18 Ctx 2/17>>>2/19 levaquin 2/19>>>   HISTORY OF PRESENT ILLNESS:   40 y.o. male past medical history of autism who presented to the ED on 2/17 w/  2 weeks of cough that has progressively gotten worse. He saw her primary care doctor the day prior to admission who did a chest x-ray and advised hospital admission. Was admitted w/ working dx of CAP. Started on IV azithro and Rocephin; as well as IV steroids. His hospital course notable for: negative influenza PCR, persistent O2 desaturation (room air sats 86% dropping to 79% during ambulation requiring 4 liters to recover).  2/20 was tachycardic so CT chest was obtained. This was negative for PE but did show patchy bibasilar airspace disease. He has continued to have O2 desaturations w/ ambulation in spite of appropriate abx therapy, BDs and empiric steroids. Because of this PCCM was asked to assess for on-going hypoxia  PAST MEDICAL HISTORY :   has a past medical history of  Autism; Sickle cell trait ([facility]); Pneumonia (02/2015); and Autism.  has past surgical history that includes No past surgeries. Prior to Admission medications   Medication Sig Start Date End Date Taking? Authorizing Provider  albuterol (PROAIR HFA) 108 (90 Base) MCG/ACT inhaler Inhale 2 puffs into the lungs every 6 (six) hours as needed for wheezing or shortness of breath.  02/27/15  Yes Historical Provider, MD  guaiFENesin (MUCINEX) 600 MG 12 hr tablet Take 600 mg by mouth 2 (two) times daily as needed for cough or to loosen phlegm.   Yes Historical Provider, MD   Allergies  Allergen Reactions  . Peanuts [Peanut Oil]     FAMILY HISTORY:  family history includes Heart attack in his mother. SOCIAL HISTORY:  reports that he has never smoked. He has never used smokeless tobacco. He reports that he does not drink alcohol or use illicit drugs.  REVIEW OF SYSTEMS:   Unable and unwilling to discuss. Wants to watch TV and eat his hamburger.   SUBJECTIVE: no distress  VITAL SIGNS: Temp:  [97.6 F (36.4 C)-98.4 F (36.9 C)] 98.4 F (36.9 C) (02/24 0545) Pulse Rate:  [91-106] 91 (02/24 0545) Resp:  [18-20] 18 (02/24 0545) BP: (115-133)/(63-74) 115/74 mmHg (02/24 0545) SpO2:  [91 %-97 %] 97 % (02/24 0916)  PHYSICAL EXAMINATION: General:  Obese 40 year old male, lying in bed. No distress. Watching TV. More interested in his cheese burger & TV than talking w/ medical team Neuro:  Awake, answers only yes/no questions. Will follow commands no focal def  HEENT:  Neck is large. No JVD. No upper airway wheeze  Cardiovascular:  Rrr, no MRG Lungs:  No wheeze. Occasional rhonchi  Abdomen:  Soft, not tender + bowel sounds  Musculoskeletal:  Equal st and bulk  Skin:  Warm, dry, intact    Recent Labs Lab 02/28/15 1908 03/01/15 0328 03/07/15 0738  NA 140 141 137  K 3.5 4.8 3.9  CL 104 106 98*  CO2 26 27 31   BUN 5* 5* 18  CREATININE 1.00 1.01 1.11  GLUCOSE 90 158* 101*    Recent Labs Lab  02/28/15 1908 03/01/15 0328  HGB 14.1 14.3  HCT 41.4 42.8  WBC 7.7 8.0  PLT 219 249   Dg Chest 2 View  03/05/2015  CLINICAL DATA:  Hypoxia, cough, shortness of breath, fever EXAM: CHEST  2 VIEW COMPARISON:  03/03/2015 FINDINGS: Cardiomediastinal silhouette is stable. No pulmonary edema. Persistent patchy bilateral lower lobe infiltrates left greater than right. Question tiny bilateral pleural effusion. IMPRESSION: Persistent patchy bilateral lower lobe infiltrates left greater than right. Question tiny bilateral pleural effusion. Electronically Signed   By: Lahoma Crocker M.D.   On: 03/05/2015 13:04  PCXR w/ L>R airspace disease -->perhaps slightly improved   ASSESSMENT / PLAN: Acute hypoxic Respiratory failure in the setting of bilateral L>R pulmonary infiltrates. Suspect that there is an element of atelectasis. Also wonder about element of edema at this point. He has body habitus for OSA. No evidence of upper airway or lower airway wheezing.  Plan Ck PCT BNP ECHO Lasix x1 Cont cont abx Taper pred over 1 week  Erick Colace ACNP-BC [place] Pager # [phone] OR # [phone] if no answer  03/07/2015, 11:27 AM  STAFF NOTE: I, Merrie Roof, MD FACP have personally reviewed patient's available data, including medical history, events of note, physical examination and test results as part of my evaluation. I have discussed with resident/NP and other care providers such as pharmacist, RN and RRT. In addition, I personally evaluated patient and elicited key findings of: no distress, lung clear to bases, no upper airway sounds, CT reviewed frmo prior and pcxr yesterday, CT with bibasilar infiltrates, he doesn't endorse fever, or had wbc on admission, hasent really responded to ABX, noted prednisone for now can maintain, will assess esr, ana, bnp , echo assessment to ensure no cardiac etiology, get slp to r/o recurrent aspiration , repeat pcxr now, likely can reduce steroids  to off soon, pending above plan, pct likely can reduce ABX exposure, treating total 8 days atypical is reasonable, will follow up in am   Lavon Paganini. Titus Mould, MD, [place] Pgr: [facility] 03/07/2015 1:58 PM

## 2015-03-07 NOTE — Progress Notes (Signed)
Patient Saturations on 4 1/2 liters of oxygen at Rest = 91%  Patient saturations on 3 liters of oxygen at rest = 89%  Patient saturations on 2 liters of oxygen at rest = 86%  Patient Saturations on 4 Liters of oxygen while Ambulating = 88-90%  Patient ambulated 375 feet in hallway while wearing 4 liters of oxygen. Patient required several reminders to breathe in through nose to keep oxygen level at 90% while ambulating. Patient is now resting in bed with call light within reach. Will continue to monitor.  Berdine Dance RN, BSN

## 2015-03-07 NOTE — Progress Notes (Signed)
PROGRESS NOTE  Juan Cameron NWG:[id number] DOB: 1975-12-07 DOA: 02/28/2015 PCP: Ananias Pilgrim, MD Outpatient Specialists:    LOS: 7 days   Brief Narrative: Juan Cameron is a 40 year old autistic male who was admitted on 2/17 with hypoxia and multifocal CAP. Currently taking Levaquin PO. Condition is improving.  Assessment & Plan: Active Problems:   Acute respiratory failure with hypoxia (HCC)   CAP (community acquired pneumonia)   Hypokalemia   Autism  Acute respiratory failure with hypoxia due to CAP  - Symptoms improving. Has remained afebrile with no leukocytosis.  - ongoing hypoxia, does not appear fluid overloaded - pulmonary consulted today for ongoing hypoxia - Repeated CXR 02/22: low lung volumes; patchy infiltrates (L>R) improved from CXR on 2/19 - CT angio negative for PE on 2/21 - Continue Levaquin him a plan for 8 days total treatment length. Today is day 8  Hypokalemia - Resolved.  Autism - Outpatient follow-up   DVT prophylaxis: Heparin Code Status: Full Family Communication: Discussed with the patient's sister over the phone [phone]) on 2/22 and 2/24 Disposition Plan: Remain inpatient; Home when ready. Barrier to discharge: Hypoxia  Consultants: None  Procedures: None  Antimicrobials: Azithromycin 2/17 >> 2/19 Ceftriaxone 2/17 >> 2/19  Levofloxacin 2/19 >>  Subjective: - complains of shortness of breath with ambulation  Objective: Filed Vitals:   03/06/15 2021 03/06/15 2108 03/07/15 0545 03/07/15 0916  BP:  133/73 115/74   Pulse:  106 91   Temp:  98.1 F (36.7 C) 98.4 F (36.9 C)   TempSrc:  Oral Oral   Resp:  18 18   Height:      Weight:      SpO2: 97% 93% 96% 97%   No intake or output data in the 24 hours ending 03/07/15 1318 Filed Weights   02/28/15 1013 02/28/15 1742  Weight: 107.049 kg (236 lb) 103.5 kg (228 lb 2.8 oz)    Examination: BP 115/74 mmHg  Pulse 91  Temp(Src) 98.4 F (36.9 C) (Oral)  Resp 18  Ht   (1.753 m)  Wt 103.5 kg (228 lb 2.8 oz)  BMI 33.68 kg/m2  SpO2 97% General exam: NAD Respiratory system: Clear. No increased work of breathing. No wheezing, no crackles Cardiovascular system: regular rate and rhythm, no murmurs, gallops. No JVD. No peripheral edema.  Gastrointestinal system: Abdomen is nondistended, soft and nontender. Normal bowel sounds heard. Central nervous system: AxOx3. No focal deficits   Data Reviewed: I have personally reviewed following labs and imaging studies  CBC:  Recent Labs Lab 02/28/15 1908 03/01/15 0328  WBC 7.7 8.0  NEUTROABS 4.8  --   HGB 14.1 14.3  HCT 41.4 42.8  MCV 78.9 79.6  PLT 219 249   Basic Metabolic Panel:  Recent Labs Lab 02/28/15 1908 03/01/15 0328 03/07/15 0738  NA 140 141 137  K 3.5 4.8 3.9  CL 104 106 98*  CO2 GLUCOSE 90 158* 101*  BUN 5* 5* 18  CREATININE 1.00 1.01 1.11  CALCIUM 8.6* 9.0 8.5*   GFR: Estimated Creatinine Clearance: 108 mL/min (by C-G formula based on Cr of 1.11). Liver Function Tests:  Recent Labs Lab 02/28/15 1908 03/01/15 0328  AST 30 30  ALT 18 21  ALKPHOS 56 57  BILITOT 0.5 0.5  PROT 7.1 7.3  ALBUMIN 3.3* 3.3*   No results for input(s): LIPASE, AMYLASE in the last 168 hours. No results for input(s): AMMONIA in the last 168 hours. Coagulation Profile:  Recent Labs  Lab 02/28/15 1908  INR 1.01   Cardiac Enzymes: No results for input(s): CKTOTAL, CKMB, CKMBINDEX, TROPONINI in the last 168 hours. BNP (last 3 results) No results for input(s): PROBNP in the last 8760 hours. HbA1C: No results for input(s): HGBA1C in the last 72 hours. CBG: No results for input(s): GLUCAP in the last 168 hours. Lipid Profile: No results for input(s): CHOL, HDL, LDLCALC, TRIG, CHOLHDL, LDLDIRECT in the last 72 hours. Thyroid Function Tests: No results for input(s): TSH, T4TOTAL, FREET4, T3FREE, THYROIDAB in the last 72 hours. Anemia Panel: No results for input(s): VITAMINB12, FOLATE,  FERRITIN, TIBC, IRON, RETICCTPCT in the last 72 hours. Urine analysis: No results found for: COLORURINE, APPEARANCEUR, LABSPEC, PHURINE, GLUCOSEU, HGBUR, BILIRUBINUR, KETONESUR, PROTEINUR, UROBILINOGEN, NITRITE, LEUKOCYTESUR Sepsis Labs: Invalid input(s): PROCALCITONIN, LACTICIDVEN  Recent Results (from the past 240 hour(s))  Rapid strep screen     Status: None   Collection Time: 02/28/15 10:30 AM  Result Value Ref Range Status   Streptococcus, Group A Screen (Direct) NEGATIVE NEGATIVE Final    Comment: (NOTE) A Rapid Antigen test may result negative if the antigen level in the sample is below the detection level of this test. The FDA has not cleared this test as a stand-alone test therefore the rapid antigen negative result has reflexed to a Group A Strep culture.   Culture, group A strep     Status: None   Collection Time: 02/28/15 10:30 AM  Result Value Ref Range Status   Specimen Description THROAT  Final   Special Requests NONE Reflexed from [id number]  Final   Culture   Final    NO GROUP A STREP (S.PYOGENES) ISOLATED Performed at [facility]    Report Status 03/03/2015 FINAL  Final  Culture, blood (routine x 2)     Status: None   Collection Time: 02/28/15 11:00 AM  Result Value Ref Range Status   Specimen Description BLOOD RIGHT ARM  Final   Special Requests BOTTLES DRAWN AEROBIC AND ANAEROBIC 5CC  Final   Culture   Final    NO GROWTH 5 DAYS Performed at [facility]    Report Status 03/05/2015 FINAL  Final  Culture, blood (routine x 2) Call MD if unable to obtain prior to antibiotics being given     Status: None   Collection Time: 02/28/15  7:08 PM  Result Value Ref Range Status   Specimen Description BLOOD LEFT ANTECUBITAL  Final   Special Requests BOTTLES DRAWN AEROBIC AND ANAEROBIC 5CC  Final   Culture NO GROWTH 5 DAYS  Final   Report Status 03/05/2015 FINAL  Final  Culture, blood (x 2)     Status: None   Collection Time: 02/28/15  7:30 PM  Result  Value Ref Range Status   Specimen Description BLOOD LEFT ANTECUBITAL  Final   Special Requests BOTTLES DRAWN AEROBIC AND ANAEROBIC 5CC  Final   Culture NO GROWTH 5 DAYS  Final   Report Status 03/05/2015 FINAL  Final      Radiology Studies: No results found.   Scheduled Meds: . antiseptic oral rinse  7 mL Mouth Rinse BID  . budesonide (PULMICORT) nebulizer solution  0.25 mg Nebulization BID  . furosemide  40 mg Intravenous Once  . heparin  5,000 Units Subcutaneous 3 times per day  . ipratropium  0.5 mg Nebulization TID  . levalbuterol  0.63 mg Nebulization TID  . levofloxacin  750 mg Oral q1800  . predniSONE  50 mg Oral Q breakfast  .  sodium chloride flush  3 mL Intravenous Q12H   Continuous Infusions: None.   Pamella Pert, MD, PhD [facility] Pager [phone]  If 7PM-7AM, please contact night-coverage [email or web address] Password [email or web address] 03/07/2015, 1:18 PM

## 2015-03-07 NOTE — Evaluation (Signed)
Clinical/Bedside Swallow Evaluation Patient Details  Name: Jerrel Tiberio MRN: [id number] Date of Birth: Feb 05, 1975  Today's Date: 03/07/2015 Time: SLP Start Time (ACUTE ONLY): 1449 SLP Stop Time (ACUTE ONLY): 1503 SLP Time Calculation (min) (ACUTE ONLY): 14 min  Past Medical History:  Past Medical History  Diagnosis Date  . Autism   . Sickle cell trait (HCC)   . Pneumonia 02/2015  . Autism    Past Surgical History:  Past Surgical History  Procedure Laterality Date  . No past surgeries     HPI:  40 year old male with autism who was admitted on 2/17 with hypoxia and multifocal CAP.   Assessment / Plan / Recommendation Clinical Impression  Pt has no overt signs of aspiration across consistencies, with challenging that included the 3 ounce water test. Recommend to continue regular textures and thin liquids. SLP to f/u briefly while he remains in house to assess for tolerance given increased respiratory demand.    Aspiration Risk  Mild aspiration risk    Diet Recommendation Regular;Thin liquid   Liquid Administration via: Cup;Straw Medication Administration: Whole meds with liquid Supervision: Patient able to self feed;Full supervision/cueing for compensatory strategies Compensations: Minimize environmental distractions;Slow rate;Small sips/bites Postural Changes: Seated upright at 90 degrees    Other  Recommendations Oral Care Recommendations: Oral care BID   Follow up Recommendations  24 hour supervision/assistance    Frequency and Duration min 2x/week  1 week       Prognosis        Swallow Study   General HPI: 40 year old male with autism who was admitted on 2/17 with hypoxia and multifocal CAP. Type of Study: Bedside Swallow Evaluation Previous Swallow Assessment: none in chart Diet Prior to this Study: Regular;Thin liquids Temperature Spikes Noted: No Respiratory Status: Nasal cannula History of Recent Intubation: No Behavior/Cognition:  Alert;Cooperative;Pleasant mood Oral Cavity Assessment: Within Functional Limits Oral Care Completed by SLP: No Oral Cavity - Dentition: Adequate natural dentition;Other (Comment) (pt reports pain while chewing on right side) Vision: Functional for self-feeding Self-Feeding Abilities: Able to feed self Patient Positioning: Upright in bed Baseline Vocal Quality: Normal    Oral/Motor/Sensory Function Overall Oral Motor/Sensory Function: Within functional limits   Ice Chips Ice chips: Not tested   Thin Liquid Thin Liquid: Within functional limits Presentation: Cup;Self Fed;Straw    Nectar Thick Nectar Thick Liquid: Not tested   Honey Thick Honey Thick Liquid: Not tested   Puree Puree: Within functional limits Presentation: Self Fed;Spoon   Solid   GO   Solid: Within functional limits Presentation: Self Fed       Maxcine Ham, M.A. CCC-SLP [phone]  Maxcine Ham 03/07/2015,3:22 PM

## 2015-03-07 NOTE — Progress Notes (Signed)
Utilization review completed.  

## 2015-03-07 NOTE — Progress Notes (Signed)
*  PRELIMINARY RESULTS* Echocardiogram 2D Echocardiogram has been performed.  Juan Cameron 03/07/2015, 1:56 PM

## 2015-03-07 NOTE — Care Management Important Message (Signed)
Important Message  Patient Details  Name: Juan Cameron MRN: [id number] Date of Birth: [birth year]   Medicare Important Message Given:  Yes    Frannie Shedrick Abena 03/07/2015, 1:06 PM

## 2015-03-07 NOTE — Progress Notes (Signed)
Protocol assessment completed and pt's score a 5. Atrovent and Xopenex changed from TID to BID based on the assessment score, BS clear. Pt has a PRN if needed.

## 2015-03-08 MED ORDER — IPRATROPIUM-ALBUTEROL 0.5-2.5 (3) MG/3ML IN SOLN
3.0000 mL | RESPIRATORY_TRACT | Status: DC | PRN
Start: 2015-03-08 — End: 2015-03-09
  Filled 2015-03-08: qty 3

## 2015-03-08 NOTE — Progress Notes (Signed)
Name: Juan Cameron MRN: [id number] DOB: 08/18/1975    ADMISSION DATE:  02/28/2015 CONSULTATION DATE:  2/24  REFERRING MD :  Elvera Lennox   CHIEF COMPLAINT:  Dyspnea and wheeze   BRIEF PATIENT DESCRIPTION:  39yom w/ sig h/o autism. Admitted 2/17 w/ CAP. Treated w/ appropriate CAP coverage, as well as empiric steroids and bronchodilator therapy. His WBCs improved, he felt better, but in spite of therapies he continued to desaturate w/ resting O2 sats at 86% on 2 liters. & CXR w/ bilateral L>R airspace disease. PCCM was asked to assess.   SIGNIFICANT EVENTS    STUDIES:  CT chest 2/20: There are bilateral patchy areas of airspace consolidation with air bronchogram predominantly involving the lung bases most compatible with multifocal pneumonia. Multiple ground-glass and nodular densities noted in the left apical region. There is no pleural effusion or pneumothorax. The central airways are patent.  Culture data  BCX2 2/17: neg U strep 2/17: negative  Abx:  azithro 2/17>>>2/18 Ctx 2/17>>>2/19 levaquin 2/19>>>   SUBJECTIVE: Comfortable supine/flat/fetal position on 3 lpm NP  VITAL SIGNS: Temp:  [97.9 F (36.6 C)-99 F (37.2 C)] 99 F (37.2 C) (02/25 2841) Pulse Rate:  [98-116] 100 (02/25 0632) Resp:  [16-18] 18 (02/25 0632) BP: (119-155)/(65-118) 119/65 mmHg (02/25 0632) SpO2:  [92 %-97 %] 97 % (02/25 3244) Weight:  [226 lb 6.4 oz (102.694 kg)] 226 lb 6.4 oz (102.694 kg) (02/24 1731)  PHYSICAL EXAMINATION: General:  Obese  bm nad  Neuro:  Awake, answers only yes/no questions. Will follow commands no focal def  HEENT:  Neck is large. No JVD. No upper airway wheeze  Cardiovascular:  Rrr, no MRG Lungs:  No wheeze. Scattered bilateral insp and exp rhonchi s true wheeze  Abdomen:  Soft, not tender + bowel sounds  Musculoskeletal:  Equal st and bulk  Skin:  Warm, dry, intact    Recent Labs Lab 03/07/15 0738  NA 137  K 3.9  CL 98*  CO2 31  BUN 18  CREATININE 1.11  GLUCOSE  101*   No results for input(s): HGB, HCT, WBC, PLT in the last 168 hours. Dg Chest Port 1 View  03/07/2015  CLINICAL DATA:  Pneumonia EXAM: PORTABLE CHEST 1 VIEW COMPARISON:  03/05/2015 FINDINGS: 1612 hours. Lung volumes are low, as before. There is persistent bibasilar atelectasis or pneumonia, slightly progressed in the interval. Cardiopericardial silhouette is at upper limits of normal for size. Low volume film crowds the central pulmonary vasculature. IMPRESSION: Low volumes with slight increase in bibasilar atelectasis or pneumonia. Electronically Signed   By: Kennith Center M.D.   On: 03/07/2015 16:20      Lab Results  Component Value Date   ESRSEDRATE 8 03/07/2015    ASSESSMENT / PLAN: Acute hypoxic Respiratory failure in the setting of bilateral L>R pulmonary infiltrates. Suspect that there is an element of  ALI and p pna atelectasis.  . No evidence of upper airway or lower airway wheezing.   Plan Ck PCT = 0.18 2/24 BNP = 17  2/24 ECHO 2/24 = G II diastolic dysfunction  Lasix x1 Cont cont abx Taper pred over 1 week   He appears to have plateaued and will likely need to be d/c'd on 02 with outpt f/u for what is ALI (acute lung injury) and not active infection or chf  - discussed with sister at bedside   Sandrea Hughs, MD Pulmonary and Critical Care Medicine [facility] Cell [phone] After 5:30 PM or weekends, call [phone]

## 2015-03-08 NOTE — Progress Notes (Signed)
PROGRESS NOTE  Juan Cameron UXL:[id number] DOB: 01-Nov-1975 DOA: 02/28/2015 PCP: Ananias Pilgrim, MD Outpatient Specialists:    LOS: 8 days   Brief Narrative: Juan Cameron is a 40 year old autistic male who was admitted on 2/17 with hypoxia and multifocal CAP. Currently taking Levaquin PO.   Assessment & Plan: Active Problems:   Acute respiratory failure with hypoxia (HCC)   CAP (community acquired pneumonia)   Hypokalemia   Autism   Dyspnea  Acute respiratory failure with hypoxia due to CAP  - Symptoms improving. Has remained afebrile with no leukocytosis.  - ongoing hypoxia, does not appear fluid overloaded - pulmonary consulted for ongoing hypoxia,  - Repeated CXR 02/22: low lung volumes; patchy infiltrates (L>R) improved from CXR on 2/19 - CT angio negative for PE on 2/21 - Finished 8 days of CAP coverage antibiotics on 2/24  Hypokalemia - Resolved.  Autism - Outpatient follow-up   DVT prophylaxis: Heparin Code Status: Full Family Communication: Discussed with the patient's sister 2/25 bedside Disposition Plan: Remain inpatient; Home when ready. Likely Monday  Barrier to discharge: Hypoxia  Consultants: Pulmonary   Procedures: None  Antimicrobials: Azithromycin 2/17 >> 2/19 Ceftriaxone 2/17 >> 2/19  Levofloxacin 2/19 >>  Subjective: - feeling well today, mild dyspnea, denies pain  Objective: Filed Vitals:   03/07/15 1731 03/07/15 1959 03/07/15 2030 03/08/15 0632  BP:   128/81 119/65  Pulse:   109 100  Temp:   98.6 F (37 C) 99 F (37.2 C)  TempSrc:   Oral Oral  Resp:   16 18  Height:  (1.753 m)     Weight: 102.694 kg (226 lb 6.4 oz)     SpO2:  94% 96% 97%    Intake/Output Summary (Last 24 hours) at 03/08/15 1353 Last data filed at 03/07/15 1358  Gross per 24 hour  Intake    240 ml  Output      0 ml  Net    240 ml   Filed Weights   02/28/15 1013 02/28/15 1742 03/07/15 1731  Weight: 107.049 kg (236 lb) 103.5 kg (228 lb 2.8 oz) 102.694  kg (226 lb 6.4 oz)    Examination: BP 119/65 mmHg  Pulse 100  Temp(Src) 99 F (37.2 C) (Oral)  Resp 18  Ht  (1.753 m)  Wt 102.694 kg (226 lb 6.4 oz)  BMI 33.42 kg/m2  SpO2 97% General exam: NAD Respiratory system: Clear. No increased work of breathing. No wheezing, no crackles Cardiovascular system: regular rate and rhythm, no murmurs, gallops. No JVD. No peripheral edema.  Gastrointestinal system: Abdomen is nondistended, soft and nontender. Normal bowel sounds heard.   Data Reviewed: I have personally reviewed following labs and imaging studies  CBC: No results for input(s): WBC, NEUTROABS, HGB, HCT, MCV, PLT in the last 168 hours. Basic Metabolic Panel:  Recent Labs Lab 03/07/15 0738  NA 137  K 3.9  CL 98*  CO2 31  GLUCOSE 101*  BUN 18  CREATININE 1.11  CALCIUM 8.5*   GFR: Estimated Creatinine Clearance: 107.6 mL/min (by C-G formula based on Cr of 1.11). Liver Function Tests: No results for input(s): AST, ALT, ALKPHOS, BILITOT, PROT, ALBUMIN in the last 168 hours. No results for input(s): LIPASE, AMYLASE in the last 168 hours. No results for input(s): AMMONIA in the last 168 hours. Coagulation Profile: No results for input(s): INR, PROTIME in the last 168 hours. Cardiac Enzymes: No results for input(s): CKTOTAL, CKMB, CKMBINDEX, TROPONINI in the last 168 hours. BNP (last  3 results) No results for input(s): PROBNP in the last 8760 hours. HbA1C: No results for input(s): HGBA1C in the last 72 hours. CBG: No results for input(s): GLUCAP in the last 168 hours. Lipid Profile: No results for input(s): CHOL, HDL, LDLCALC, TRIG, CHOLHDL, LDLDIRECT in the last 72 hours. Thyroid Function Tests: No results for input(s): TSH, T4TOTAL, FREET4, T3FREE, THYROIDAB in the last 72 hours. Anemia Panel: No results for input(s): VITAMINB12, FOLATE, FERRITIN, TIBC, IRON, RETICCTPCT in the last 72 hours. Urine analysis: No results found for: COLORURINE, APPEARANCEUR,  LABSPEC, PHURINE, GLUCOSEU, HGBUR, BILIRUBINUR, KETONESUR, PROTEINUR, UROBILINOGEN, NITRITE, LEUKOCYTESUR Sepsis Labs: Invalid input(s): PROCALCITONIN, LACTICIDVEN  Recent Results (from the past 240 hour(s))  Rapid strep screen     Status: None   Collection Time: 02/28/15 10:30 AM  Result Value Ref Range Status   Streptococcus, Group A Screen (Direct) NEGATIVE NEGATIVE Final    Comment: (NOTE) A Rapid Antigen test may result negative if the antigen level in the sample is below the detection level of this test. The FDA has not cleared this test as a stand-alone test therefore the rapid antigen negative result has reflexed to a Group A Strep culture.   Culture, group A strep     Status: None   Collection Time: 02/28/15 10:30 AM  Result Value Ref Range Status   Specimen Description THROAT  Final   Special Requests NONE Reflexed from [id number]  Final   Culture   Final    NO GROUP A STREP (S.PYOGENES) ISOLATED Performed at [facility]    Report Status 03/03/2015 FINAL  Final  Culture, blood (routine x 2)     Status: None   Collection Time: 02/28/15 11:00 AM  Result Value Ref Range Status   Specimen Description BLOOD RIGHT ARM  Final   Special Requests BOTTLES DRAWN AEROBIC AND ANAEROBIC 5CC  Final   Culture   Final    NO GROWTH 5 DAYS Performed at [facility]    Report Status 03/05/2015 FINAL  Final  Culture, blood (routine x 2) Call MD if unable to obtain prior to antibiotics being given     Status: None   Collection Time: 02/28/15  7:08 PM  Result Value Ref Range Status   Specimen Description BLOOD LEFT ANTECUBITAL  Final   Special Requests BOTTLES DRAWN AEROBIC AND ANAEROBIC 5CC  Final   Culture NO GROWTH 5 DAYS  Final   Report Status 03/05/2015 FINAL  Final  Culture, blood (x 2)     Status: None   Collection Time: 02/28/15  7:30 PM  Result Value Ref Range Status   Specimen Description BLOOD LEFT ANTECUBITAL  Final   Special Requests BOTTLES DRAWN AEROBIC AND  ANAEROBIC 5CC  Final   Culture NO GROWTH 5 DAYS  Final   Report Status 03/05/2015 FINAL  Final      Radiology Studies: Dg Chest Port 1 View  03/07/2015  CLINICAL DATA:  Pneumonia EXAM: PORTABLE CHEST 1 VIEW COMPARISON:  03/05/2015 FINDINGS: 1612 hours. Lung volumes are low, as before. There is persistent bibasilar atelectasis or pneumonia, slightly progressed in the interval. Cardiopericardial silhouette is at upper limits of normal for size. Low volume film crowds the central pulmonary vasculature. IMPRESSION: Low volumes with slight increase in bibasilar atelectasis or pneumonia. Electronically Signed   By: Kennith Center M.D.   On: 03/07/2015 16:20     Scheduled Meds: . antiseptic oral rinse  7 mL Mouth Rinse BID  . budesonide (PULMICORT) nebulizer solution  0.25 mg Nebulization BID  . heparin  5,000 Units Subcutaneous 3 times per day  . levofloxacin  750 mg Oral q1800  . predniSONE  50 mg Oral Q breakfast  . sodium chloride flush  3 mL Intravenous Q12H   Continuous Infusions: None.   Pamella Pert, MD, PhD [facility] Pager [phone]  If 7PM-7AM, please contact night-coverage [email or web address] Password [email or web address] 03/08/2015, 1:53 PM

## 2015-03-09 LAB — PROCALCITONIN: PROCALCITONIN: 0.12 ng/mL

## 2015-03-09 MED ORDER — IPRATROPIUM-ALBUTEROL 0.5-2.5 (3) MG/3ML IN SOLN
3.0000 mL | RESPIRATORY_TRACT | Status: DC | PRN
  Administered 2015-03-09: 3 mL via RESPIRATORY_TRACT

## 2015-03-09 MED ORDER — IPRATROPIUM-ALBUTEROL 0.5-2.5 (3) MG/3ML IN SOLN
3.0000 mL | Freq: Four times a day (QID) | RESPIRATORY_TRACT | Status: DC
  Administered 2015-03-10 (×2): 3 mL via RESPIRATORY_TRACT
  Filled 2015-03-09 (×3): qty 3

## 2015-03-09 NOTE — Progress Notes (Addendum)
PROGRESS NOTE  Juan Cameron ZOX:[id number] DOB: July 03, 1975 DOA: 02/28/2015 PCP: Ananias Pilgrim, MD Outpatient Specialists:    LOS: 9 days   Brief Narrative: Juan Cameron is a 40 year old autistic male who was admitted on 2/17 with hypoxia and multifocal CAP. Currently taking Levaquin PO.   Assessment & Plan: Active Problems:   Acute respiratory failure with hypoxia (HCC)   CAP (community acquired pneumonia)   Hypokalemia   Autism   Dyspnea  Acute respiratory failure with hypoxia due to CAP  - Symptoms improving. Has remained afebrile with no leukocytosis.  - ongoing hypoxia, does not appear fluid overloaded - pulmonary consulted for ongoing hypoxia,  - Repeated CXR 02/22: low lung volumes; patchy infiltrates (L>R) improved from CXR on 2/19 - CT angio negative for PE on 2/21 - Finished 8 days of CAP coverage antibiotics on 2/24.  - supportive care today, check O2 sats in am with and without O2 and consider d/c home with home o2  Hypokalemia - Resolved.  Autism - Outpatient follow-up   DVT prophylaxis: Heparin Code Status: Full Family Communication: Discussed with the patient's sister 2/25 bedside Disposition Plan: Remain inpatient; Home when ready. Likely tomorrow Barrier to discharge: Hypoxia  Consultants: Pulmonary   Procedures: None  Antimicrobials: Azithromycin 2/17 >> 2/19 Ceftriaxone 2/17 >> 2/19  Levofloxacin 2/19 >>  Subjective: - upset with me as he is "sleeping"  Objective: Filed Vitals:   03/08/15 2108 03/08/15 2111 03/09/15 0515 03/09/15 0911  BP: 115/66  118/72   Pulse: 92  91   Temp: 98.3 F (36.8 C)  98.4 F (36.9 C)   TempSrc: Oral  Oral   Resp: 18  18   Height:      Weight:      SpO2: 93% 96% 96% 92%   No intake or output data in the 24 hours ending 03/09/15 1251 Filed Weights   02/28/15 1013 02/28/15 1742 03/07/15 1731  Weight: 107.049 kg (236 lb) 103.5 kg (228 lb 2.8 oz) 102.694 kg (226 lb 6.4 oz)    Examination: BP 118/72  mmHg  Pulse 91  Temp(Src) 98.4 F (36.9 C) (Oral)  Resp 18  Ht  (1.753 m)  Wt 102.694 kg (226 lb 6.4 oz)  BMI 33.42 kg/m2  SpO2 92% General exam: NAD Respiratory system: Clear. No increased work of breathing. No wheezing, no crackles Cardiovascular system: regular rate and rhythm, no murmurs, gallops. No JVD. No peripheral edema.  Gastrointestinal system: Abdomen is nondistended, soft and nontender. Normal bowel sounds heard.   Data Reviewed: I have personally reviewed following labs and imaging studies  CBC: No results for input(s): WBC, NEUTROABS, HGB, HCT, MCV, PLT in the last 168 hours. Basic Metabolic Panel:  Recent Labs Lab 03/07/15 0738  NA 137  K 3.9  CL 98*  CO2 31  GLUCOSE 101*  BUN 18  CREATININE 1.11  CALCIUM 8.5*   GFR: Estimated Creatinine Clearance: 107.6 mL/min (by C-G formula based on Cr of 1.11). Liver Function Tests: No results for input(s): AST, ALT, ALKPHOS, BILITOT, PROT, ALBUMIN in the last 168 hours. No results for input(s): LIPASE, AMYLASE in the last 168 hours. No results for input(s): AMMONIA in the last 168 hours. Coagulation Profile: No results for input(s): INR, PROTIME in the last 168 hours. Cardiac Enzymes: No results for input(s): CKTOTAL, CKMB, CKMBINDEX, TROPONINI in the last 168 hours. BNP (last 3 results) No results for input(s): PROBNP in the last 8760 hours. HbA1C: No results for input(s): HGBA1C in the last  72 hours. CBG: No results for input(s): GLUCAP in the last 168 hours. Lipid Profile: No results for input(s): CHOL, HDL, LDLCALC, TRIG, CHOLHDL, LDLDIRECT in the last 72 hours. Thyroid Function Tests: No results for input(s): TSH, T4TOTAL, FREET4, T3FREE, THYROIDAB in the last 72 hours. Anemia Panel: No results for input(s): VITAMINB12, FOLATE, FERRITIN, TIBC, IRON, RETICCTPCT in the last 72 hours. Urine analysis: No results found for: COLORURINE, APPEARANCEUR, LABSPEC, PHURINE, GLUCOSEU, HGBUR, BILIRUBINUR,  KETONESUR, PROTEINUR, UROBILINOGEN, NITRITE, LEUKOCYTESUR Sepsis Labs: Invalid input(s): PROCALCITONIN, LACTICIDVEN  Recent Results (from the past 240 hour(s))  Rapid strep screen     Status: None   Collection Time: 02/28/15 10:30 AM  Result Value Ref Range Status   Streptococcus, Group A Screen (Direct) NEGATIVE NEGATIVE Final    Comment: (NOTE) A Rapid Antigen test may result negative if the antigen level in the sample is below the detection level of this test. The FDA has not cleared this test as a stand-alone test therefore the rapid antigen negative result has reflexed to a Group A Strep culture.   Culture, group A strep     Status: None   Collection Time: 02/28/15 10:30 AM  Result Value Ref Range Status   Specimen Description THROAT  Final   Special Requests NONE Reflexed from [id number]  Final   Culture   Final    NO GROUP A STREP (S.PYOGENES) ISOLATED Performed at [facility]    Report Status 03/03/2015 FINAL  Final  Culture, blood (routine x 2)     Status: None   Collection Time: 02/28/15 11:00 AM  Result Value Ref Range Status   Specimen Description BLOOD RIGHT ARM  Final   Special Requests BOTTLES DRAWN AEROBIC AND ANAEROBIC 5CC  Final   Culture   Final    NO GROWTH 5 DAYS Performed at [facility]    Report Status 03/05/2015 FINAL  Final  Culture, blood (routine x 2) Call MD if unable to obtain prior to antibiotics being given     Status: None   Collection Time: 02/28/15  7:08 PM  Result Value Ref Range Status   Specimen Description BLOOD LEFT ANTECUBITAL  Final   Special Requests BOTTLES DRAWN AEROBIC AND ANAEROBIC 5CC  Final   Culture NO GROWTH 5 DAYS  Final   Report Status 03/05/2015 FINAL  Final  Culture, blood (x 2)     Status: None   Collection Time: 02/28/15  7:30 PM  Result Value Ref Range Status   Specimen Description BLOOD LEFT ANTECUBITAL  Final   Special Requests BOTTLES DRAWN AEROBIC AND ANAEROBIC 5CC  Final   Culture NO GROWTH 5 DAYS   Final   Report Status 03/05/2015 FINAL  Final      Radiology Studies: Dg Chest Port 1 View  03/07/2015  CLINICAL DATA:  Pneumonia EXAM: PORTABLE CHEST 1 VIEW COMPARISON:  03/05/2015 FINDINGS: 1612 hours. Lung volumes are low, as before. There is persistent bibasilar atelectasis or pneumonia, slightly progressed in the interval. Cardiopericardial silhouette is at upper limits of normal for size. Low volume film crowds the central pulmonary vasculature. IMPRESSION: Low volumes with slight increase in bibasilar atelectasis or pneumonia. Electronically Signed   By: Kennith Center M.D.   On: 03/07/2015 16:20     Scheduled Meds: . antiseptic oral rinse  7 mL Mouth Rinse BID  . budesonide (PULMICORT) nebulizer solution  0.25 mg Nebulization BID  . heparin  5,000 Units Subcutaneous 3 times per day  . predniSONE  50 mg  Oral Q breakfast  . sodium chloride flush  3 mL Intravenous Q12H   Continuous Infusions: None.   Pamella Pert, MD, PhD [facility] Pager [phone]  If 7PM-7AM, please contact night-coverage [email or web address] Password [facility] 03/09/2015, 12:51 PM

## 2015-03-09 NOTE — Progress Notes (Signed)
Pt. Walked 659ft. On 4 l of o2 sat was 90%-91%      On 3l of o2 it droped to 86% - 88% the pt. Also need to stop three or four time during the walk.

## 2015-03-10 ENCOUNTER — Inpatient Hospital Stay (HOSPITAL_COMMUNITY): Payer: Medicare Other

## 2015-03-10 DIAGNOSIS — S27309A Unspecified injury of lung, unspecified, initial encounter: Secondary | ICD-10-CM

## 2015-03-10 DIAGNOSIS — S27309S Unspecified injury of lung, unspecified, sequela: Secondary | ICD-10-CM

## 2015-03-10 MED ORDER — PREDNISONE 20 MG PO TABS: 40.0000 mg | ORAL_TABLET | Freq: Every day | ORAL | Status: DC

## 2015-03-10 NOTE — Discharge Summary (Signed)
Physician Discharge Summary  Dorrance Sellick XBJ:[id number] DOB: 1975-11-29 DOA: 02/28/2015  PCP: Ananias Pilgrim, MD  Admit date: 02/28/2015 Discharge date: 03/10/2015  Time spent: > 30 minutes  Recommendations for Outpatient Follow-up:  1. Follow up with Dr. Tyson Alias in 1 week 2. Follow up with PCP in 1-2 weeks   Discharge Diagnoses:  Active Problems:   Acute respiratory failure with hypoxia (HCC)   CAP (community acquired pneumonia)   Hypokalemia   Autism   Dyspnea   Acute lung injury  Discharge Condition: stable  Diet recommendation: regular  Filed Weights   02/28/15 1013 02/28/15 1742 03/07/15 1731  Weight: 107.049 kg (236 lb) 103.5 kg (228 lb 2.8 oz) 102.694 kg (226 lb 6.4 oz)   History of present illness:  See H&P, Labs, Consult and Test reports for all details in brief, patient is a  40 year old autistic male who was admitted on 2/17 with hypoxia and multifocal CAP  Hospital Course:  Patient was admitted to the hospital with acute respiratory failure with hypoxia due to community-acquired pneumonia. He was started on antibiotics with Levaquin, with improvement in his clinical status. Patient finished an antibiotic course will hospitalized. Given persistent hypoxia, pulmonology was consulted and have follow-up patient will hospitalized. He was seen on the day of discharge by pulmonology, will feel like he had acute lung injury following his community-acquired pneumonia, without any active infection or CHF, and recommended to taper the prednisone on discharge, as well as oxygen for home with outpatient follow-up in 1 week to determine whether he will still need oxygen at that time. Care management was consulted and assisted with oxygen supply. Patient underwent serial chest x-rays as well as a CT angiogram (which was negative for PE on 2/21), just prior to discharge which showed stable patchy bibasilar lung consolidation. RN recorded a one time fever 2/27, however this was  repeated 2 afterwards and he was afebrile. patient was stable, asymptomatic, was discharged home in stable condition and will have follow-up with pulmonology in one week. Patient also underwent a 2-D echo which showed normal ejection fraction (as below for), and had a BNP done which was within normal limits.    Hypokalemia - Resolved. Autism - Outpatient follow-up   Procedures:  2D echo  Study Conclusions - Left ventricle: The cavity size was normal. There was moderateconcentric hypertrophy. Systolic function was normal. Theestimated ejection fraction was in the range of 60% to 65%. Wallmotion was normal; there were no regional wall motionabnormalities. Features are consistent with a pseudonormal leftventricular filling pattern, with concomitant abnormal relaxationand increased filling pressure (grade 2 diastolic dysfunction). - Aortic valve: Trileaflet; normal thickness, mildly calcifiedleaflets. - Mitral valve: Calcified annulus. There was trivial regurgitation.   Consultations:  Pulmonology   Discharge Exam: Filed Vitals:   03/10/15 0601 03/10/15 0923 03/10/15 1200 03/10/15 1216  BP: 115/66   100/57  Pulse: 124   118  Temp: 98.6 F (37 C)  102 F (38.9 C) 98.1 F (36.7 C)  TempSrc: Oral  Oral Oral  Resp: 18   19  Height:      Weight:      SpO2: 96% 92%  94%   General: NAD Cardiovascular: RRR Respiratory: CTA biL  Discharge Instructions Activity:  As tolerated   Get Medicines reviewed and adjusted: Please take all your medications with you for your next visit with your Primary MD  Please request your Primary MD to go over all hospital tests and procedure/radiological results at the follow up, please  ask your Primary MD to get all Hospital records sent to his/her office.  If you experience worsening of your admission symptoms, develop shortness of breath, life threatening emergency, suicidal or homicidal thoughts you must seek medical attention immediately by  calling 911 or calling your MD immediately if symptoms less severe.  You must read complete instructions/literature along with all the possible adverse reactions/side effects for all the Medicines you take and that have been prescribed to you. Take any new Medicines after you have completely understood and accpet all the possible adverse reactions/side effects.   Do not drive when taking Pain medications.   Do not take more than prescribed Pain, Sleep and Anxiety Medications  Special Instructions: If you have smoked or chewed Tobacco in the last 2 yrs please stop smoking, stop any regular Alcohol and or any Recreational drug use.  Wear Seat belts while driving.  Please note  You were cared for by a hospitalist during your hospital stay. Once you are discharged, your primary care physician will handle any further medical issues. Please note that NO REFILLS for any discharge medications will be authorized once you are discharged, as it is imperative that you return to your primary care physician (or establish a relationship with a primary care physician if you do not have one) for your aftercare needs so that they can reassess your need for medications and monitor your lab values.    Medication List    TAKE these medications        guaiFENesin 600 MG 12 hr tablet  Commonly known as:  MUCINEX  Take 600 mg by mouth 2 (two) times daily as needed for cough or to loosen phlegm.     predniSONE 20 MG tablet  Commonly known as:  DELTASONE  Take 2 tablets (40 mg total) by mouth daily with breakfast. 40 mg for 3 days then 20 mg for 4 days then 10 mg for 2 days     PROAIR HFA 108 (90 Base) MCG/ACT inhaler  Generic drug:  albuterol  Inhale 2 puffs into the lungs every 6 (six) hours as needed for wheezing or shortness of breath.           Follow-up Information    Follow up with Nelda Bucks., MD. Schedule an appointment as soon as possible for a visit in 1 week.   Specialty:   Pulmonary Disease   Contact information:   [address] [place] [address] [phone]       Follow up with [facility]   Why:  Home 02 arranged- portable tank to be delivered to room prior to discharge   Contact information:   [address] [place] [address] [phone]       The results of significant diagnostics from this hospitalization (including imaging, microbiology, ancillary and laboratory) are listed below for reference.    Significant Diagnostic Studies: Dg Chest 2 View  03/10/2015  CLINICAL DATA:  Fever.  Chest pain. EXAM: CHEST  2 VIEW COMPARISON:  03/07/2015 chest radiograph. FINDINGS: Low lung volumes. Stable cardiomediastinal silhouette with normal heart size. No pneumothorax. No definite pleural effusions. Patchy bibasilar lung consolidation, not appreciably changed. No overt pulmonary edema. IMPRESSION: Low lung volumes with stable patchy bibasilar lung consolidation, suspicious for a combination of atelectasis and pneumonia. Electronically Signed   By: Delbert Phenix M.D.   On: 03/10/2015 13:07   Dg Chest 2 View  03/05/2015  CLINICAL DATA:  Hypoxia, cough, shortness of  breath, fever EXAM: CHEST  2 VIEW COMPARISON:  03/03/2015 FINDINGS: Cardiomediastinal silhouette is stable. No pulmonary edema. Persistent patchy bilateral lower lobe infiltrates left greater than right. Question tiny bilateral pleural effusion. IMPRESSION: Persistent patchy bilateral lower lobe infiltrates left greater than right. Question tiny bilateral pleural effusion. Electronically Signed   By: Natasha Mead M.D.   On: 03/05/2015 13:04   Dg Chest 2 View  03/02/2015  CLINICAL DATA:  Dyspnea EXAM: CHEST  2 VIEW COMPARISON:  02/28/2015 chest radiograph. FINDINGS: Low lung volumes. Stable cardiomediastinal silhouette with normal heart size. No pneumothorax. Small bilateral pleural effusions, stable on the right and slightly increased on the left. Extensive patchy  consolidation at both lung bases, slightly worsened on the left. No pulmonary edema. IMPRESSION: 1. Low lung volumes. Extensive patchy bibasilar lung consolidation, slightly worsened on the left, favor multifocal pneumonia. 2. Small bilateral pleural effusions, stable on the right and increased on the left. Electronically Signed   By: Delbert Phenix M.D.   On: 03/02/2015 09:27   Dg Chest 2 View  02/28/2015  CLINICAL DATA:  Cough and chest tightness for 1 week. Rapid respirations. Initial encounter. EXAM: CHEST  2 VIEW COMPARISON:  None. FINDINGS: There is bilateral lower lobe and right middle lobe airspace disease most consistent with pneumonia. No pneumothorax or pleural effusion is identified. Heart size is normal. Lung volumes are low. IMPRESSION: Extensive bilateral lower lobe and right middle lobe airspace disease most consistent with pneumonia. Electronically Signed   By: Drusilla Kanner M.D.   On: 02/28/2015 11:14   Ct Angio Chest Pe W/cm &/or Wo Cm  03/03/2015  CLINICAL DATA:  40 year old male with shortness of breath and chest pain. EXAM: CT ANGIOGRAPHY CHEST WITH CONTRAST recommended. TECHNIQUE: Multidetector CT imaging of the chest was performed using the standard protocol during bolus administration of intravenous contrast. Multiplanar CT image reconstructions and MIPs were obtained to evaluate the vascular anatomy. CONTRAST:  80mL OMNIPAQUE IOHEXOL 350 MG/ML SOLN COMPARISON:  Radiograph dated 03/02/2015 FINDINGS: There are bilateral patchy areas of airspace consolidation with air bronchogram predominantly involving the lung bases most compatible with multifocal pneumonia. Multiple ground-glass and nodular densities noted in the left apical region. There is no pleural effusion or pneumothorax. The central airways are patent. The thoracic aorta appears unremarkable. Scratch no CT evidence of pulmonary embolism. Top-normal cardiac size. No pericardial effusion. Subcarinal lymph node measures 14 mm in  short axis. The esophagus is grossly unremarkable. No thyroid nodule identified. There is no axillary adenopathy. The chest wall soft tissues appear unremarkable. The osseous structures are intact. The visualized upper abdomen appear unremarkable Review of the MIP images confirms the above findings. IMPRESSION: No CT evidence of pulmonary embolism. Multi focal pneumonia. Correlation with clinical exam and patient's symptomatology recommended to exclude acute chest syndrome. Follow-up recommended. Electronically Signed   By: Elgie Collard M.D.   On: 03/03/2015 16:12   Dg Chest Port 1 View  03/07/2015  CLINICAL DATA:  Pneumonia EXAM: PORTABLE CHEST 1 VIEW COMPARISON:  03/05/2015 FINDINGS: 1612 hours. Lung volumes are low, as before. There is persistent bibasilar atelectasis or pneumonia, slightly progressed in the interval. Cardiopericardial silhouette is at upper limits of normal for size. Low volume film crowds the central pulmonary vasculature. IMPRESSION: Low volumes with slight increase in bibasilar atelectasis or pneumonia. Electronically Signed   By: Kennith Center M.D.   On: 03/07/2015 16:20    Microbiology: Recent Results (from the past 240 hour(s))  Culture, blood (routine x 2) Call  MD if unable to obtain prior to antibiotics being given     Status: None   Collection Time: 02/28/15  7:08 PM  Result Value Ref Range Status   Specimen Description BLOOD LEFT ANTECUBITAL  Final   Special Requests BOTTLES DRAWN AEROBIC AND ANAEROBIC 5CC  Final   Culture NO GROWTH 5 DAYS  Final   Report Status 03/05/2015 FINAL  Final  Culture, blood (x 2)     Status: None   Collection Time: 02/28/15  7:30 PM  Result Value Ref Range Status   Specimen Description BLOOD LEFT ANTECUBITAL  Final   Special Requests BOTTLES DRAWN AEROBIC AND ANAEROBIC 5CC  Final   Culture NO GROWTH 5 DAYS  Final   Report Status 03/05/2015 FINAL  Final     Labs: Basic Metabolic Panel:  Recent Labs Lab 03/07/15 0738  NA 137    K 3.9  CL 98*  CO2 31  GLUCOSE 101*  BUN 18  CREATININE 1.11  CALCIUM 8.5*   BNP: BNP (last 3 results)  Recent Labs  03/07/15 1303  BNP 17.2     Signed:  GHERGHE, COSTIN  [facility] 03/10/2015, 5:52 PM

## 2015-03-10 NOTE — Care Management Note (Signed)
Case Management Note Donn Pierini RN, BSN Unit 2W-Case Manager [phone]  Patient Details  Name: Juan Cameron MRN: [id number] Date of Birth: 03-19-1975  Subjective/Objective:     Pt admitted with resp. Failure with hypoxia- CAP               Action/Plan: PTA pt lived at home- with sister as caregiver- spoke with sister [name] this am via TC- regarding potential d/c needs- sister very anxious regarding pt's continued 02 needs and drops in 02 saturations - explained that home 02 could be arranged with MD order- however pt does not have qualifying dx and insurance may not cover cost- sister voiced understanding and wants what's best for pt -willing to speak with [facility] regarding 02 cost as it will most likely be short term that pt needs it. Pt has an aide that comes once a week- sister open to [facility]- and does not have a preference for agency- pt may benefit for [facility] and PT to watch 02 recovery at home and increase strength/recovery from acute illness. MD please consider HH order for RN/PT. Sister states that she will need a days notice on discharge to make arrangements with work and school to be home with pt and provide transportation. CM to follow for further d/c planning  Expected Discharge Date:    03/10/15              Expected Discharge Plan:  Home w Home Health Services  In-House Referral:     Discharge planning Services  CM Consult  Post Acute Care Choice:  Home Health, Durable Medical Equipment Choice offered to:  Sibling  DME Arranged:  Oxygen DME Agency:  [facility]  HH Arranged:    HH Agency:  [facility]  Status of Service:  Completed, signed off  Medicare Important Message Given:  Yes Date Medicare IM Given:    Medicare IM give by:    Date Additional Medicare IM Given:    Additional Medicare Important Message give by:     If discussed at Long Length of Stay Meetings, dates discussed:    Discharge Disposition: home/self care   Additional  Comments:  03/10/15- Donn Pierini RN, BSN- pt for d/c home today- discussed with MD- pt to go home with home 02- does not have a chronic dx to qualify for home 02 however with MD order home 02 can be provided- spoke with [name] at [facility] who will call pt's sister and explain cost of home 02 if insurance does not cover. Will deliver portable 02 tank to room prior to discharge. Pt's sister to come and transport home later this afternoon.   Darrold Span, RN 03/10/2015, 8:38 PM

## 2015-03-10 NOTE — Discharge Instructions (Signed)
Follow with ASRES,ALEHEGN, MD in 5-7 days  Please get a complete blood count and chemistry panel checked by your Primary MD at your next visit, and again as instructed by your Primary MD. Please get your medications reviewed and adjusted by your Primary MD.  Please request your Primary MD to go over all Hospital Tests and Procedure/Radiological results at the follow up, please get all Hospital records sent to your Prim MD by signing hospital release before you go home.  If you had Pneumonia of Lung problems at the Hospital: Please get a 2 view Chest X ray done in 6-8 weeks after hospital discharge or sooner if instructed by your Primary MD.  If you have Congestive Heart Failure: Please call your Cardiologist or Primary MD anytime you have any of the following symptoms:  1) 3 pound weight gain in 24 hours or 5 pounds in 1 week  2) shortness of breath, with or without a dry hacking cough  3) swelling in the hands, feet or stomach  4) if you have to sleep on extra pillows at night in order to breathe  Follow cardiac low salt diet and 1.5 lit/day fluid restriction.  If you have diabetes Accuchecks 4 times/day, Once in AM empty stomach and then before each meal. Log in all results and show them to your primary doctor at your next visit. If any glucose reading is under 80 or above 300 call your primary MD immediately.  If you have Seizure/Convulsions/Epilepsy: Please do not drive, operate heavy machinery, participate in activities at heights or participate in high speed sports until you have seen by Primary MD or a Neurologist and advised to do so again.  If you had Gastrointestinal Bleeding: Please ask your Primary MD to check a complete blood count within one week of discharge or at your next visit. Your endoscopic/colonoscopic biopsies that are pending at the time of discharge, will also need to followed by your Primary MD.  Get Medicines reviewed and adjusted. Please take all your  medications with you for your next visit with your Primary MD  Please request your Primary MD to go over all hospital tests and procedure/radiological results at the follow up, please ask your Primary MD to get all Hospital records sent to his/her office.  If you experience worsening of your admission symptoms, develop shortness of breath, life threatening emergency, suicidal or homicidal thoughts you must seek medical attention immediately by calling 911 or calling your MD immediately  if symptoms less severe.  You must read complete instructions/literature along with all the possible adverse reactions/side effects for all the Medicines you take and that have been prescribed to you. Take any new Medicines after you have completely understood and accpet all the possible adverse reactions/side effects.   Do not drive or operate heavy machinery when taking Pain medications.   Do not take more than prescribed Pain, Sleep and Anxiety Medications  Special Instructions: If you have smoked or chewed Tobacco  in the last 2 yrs please stop smoking, stop any regular Alcohol  and or any Recreational drug use.  Wear Seat belts while driving.  Please note You were cared for by a hospitalist during your hospital stay. If you have any questions about your discharge medications or the care you received while you were in the hospital after you are discharged, you can call the unit and asked to speak with the hospitalist on call if the hospitalist that took care of you is not available. Once you  are discharged, your primary care physician will handle any further medical issues. Please note that NO REFILLS for any discharge medications will be authorized once you are discharged, as it is imperative that you return to your primary care physician (or establish a relationship with a primary care physician if you do not have one) for your aftercare needs so that they can reassess your need for medications and monitor your  lab values.  You can reach the hospitalist office at phone [phone] or fax [phone]   If you do not have a primary care physician, you can call [phone] for a physician referral.  Activity: As tolerated with Full fall precautions use walker/cane & assistance as needed  Diet: regular  Disposition Home

## 2015-03-10 NOTE — Progress Notes (Signed)
Speech Language Pathology Dysphagia Treatment Patient Details Name: Juan Cameron MRN: [id number] DOB: 11-Nov-1975 Today's Date: 03/10/2015 Time: 4619-0122 SLP Time Calculation (min) (ACUTE ONLY): 9 min  Assessment / Plan / Recommendation Clinical Impression    No evidence of dysphagia and no overt s/s of penetration/aspiration witnessed. Pt did not report difficulty swallowing. Mastication WFL. Pt educated re: diet recommendation of regular textures and thin liquids (straws allowed). No further SLP intervention necessary.    Diet Recommendation    Regular diet, thin liquids, meds whole with liquids   SLP Plan All goals met      Swallowing Goals     General Behavior/Cognition: Alert;Cooperative;Pleasant mood Patient Positioning: Upright in bed Oral care provided: N/A HPI: 40 year old male with autism who was admitted on 2/17 with hypoxia and multifocal CAP.  Oral Cavity - Oral Hygiene     Dysphagia Treatment Treatment Methods: Skilled observation;Compensation strategy training;Patient/caregiver education;Differential diagnosis Patient observed directly with PO's: Yes Type of PO's observed: Regular;Thin liquids Feeding: Able to feed self Liquids provided via: Cup;Straw Oral Phase Signs & Symptoms:  (none) Pharyngeal Phase Signs & Symptoms:  (none) Amount of cueing: Independent   GO     Juan Cameron 03/10/2015, 3:08 PM   Titus Mould, Student-SLP

## 2015-03-10 NOTE — Progress Notes (Signed)
SATURATION QUALIFICATIONS: (This note is used to comply with regulatory documentation for home oxygen)  Patient Saturations on Room Air at Rest = 81%  Patient Saturations on Room Air while Ambulating = 80%  Patient Saturations on 4 Liters of oxygen while Ambulating = 88-94%  Please briefly explain why patient needs home oxygen:   Sats dropping when ambulating and at rest sats are too low on RA. Kathryne Hitch

## 2015-03-10 NOTE — Progress Notes (Signed)
03/10/2015 5:36 PM Discharge AVS meds taken today and those due this evening reviewed.  Follow-up appointments and when to call md reviewed.  D/C IV and TELE.  Questions and concerns addressed.   D/C home per orders. Kathryne Hitch

## 2015-03-10 NOTE — Progress Notes (Signed)
   Name: Juan Cameron MRN: [id number] DOB: 1975-09-08    ADMISSION DATE:  02/28/2015 CONSULTATION DATE:  2/24  REFERRING MD :  Elvera Lennox   CHIEF COMPLAINT:  Dyspnea and wheeze   BRIEF PATIENT DESCRIPTION:  39yom w/ sig h/o autism. Admitted 2/17 w/ CAP. Treated w/ appropriate CAP coverage, as well as empiric steroids and bronchodilator therapy. His WBCs improved, he felt better, but in spite of therapies he continued to desaturate w/ resting O2 sats at 86% on 2 liters. & CXR w/ bilateral L>R airspace disease. PCCM was asked to assess.   SIGNIFICANT EVENTS    STUDIES:  CT chest 2/20: There are bilateral patchy areas of airspace consolidation with air bronchogram predominantly involving the lung bases most compatible with multifocal pneumonia. Multiple ground-glass and nodular densities noted in the left apical region. There is no pleural effusion or pneumothorax. The central airways are patent.  Culture data  BCX2 2/17: neg U strep 2/17: negative  Abx:  azithro 2/17>>>2/18 Ctx 2/17>>>2/19 levaquin 2/19>>>   SUBJECTIVE: Sitting up on 2 lpm [place]  VITAL SIGNS: Temp:  [98.6 F (37 C)-100.3 F (37.9 C)] 98.6 F (37 C) (02/27 0601) Pulse Rate:  [112-124] 124 (02/27 0601) Resp:  [17-18] 18 (02/27 0601) BP: (115-124)/(57-84) 115/66 mmHg (02/27 0601) SpO2:  [92 %-96 %] 92 % (02/27 0923)  PHYSICAL EXAMINATION: General:  Obese  bm nad  Neuro:  Awake, answers only yes/no questions. Will follow commands no focal def  HEENT:  Neck is large. No JVD. No upper airway wheeze  Cardiovascular:  Rrr, no MRG Lungs:  No wheeze. Decreased BS BL Abdomen:  Soft, not tender + bowel sounds  Musculoskeletal:  Equal st and bulk  Skin:  Warm, dry, intact    Recent Labs Lab 03/07/15 0738  NA 137  K 3.9  CL 98*  CO2 31  BUN 18  CREATININE 1.11  GLUCOSE 101*   No results for input(s): HGB, HCT, WBC, PLT in the last 168 hours. No results found.     ASSESSMENT / PLAN: Acute hypoxic  Respiratory failure in the setting of bilateral L>R pulmonary infiltrates. Suspect that there is an element of  ALI and p pna atelectasis.  . No evidence of upper airway or lower airway wheezing.  ECHO 2/24 = G II diastolic dysfunction  Plan Lasix x1 Cont cont abx Taper pred over 1 week   If he still requires o2, will likely need to be d/c'd on 02 with outpt f/u in 1 week to reassess need -for what is ALI (acute lung injury) and not active infection or chf    PCCM available as needed  Cyril Mourning MD. FCCP. [facility] Pager [phone] If no response call [phone]

## 2015-03-11 ENCOUNTER — Inpatient Hospital Stay (HOSPITAL_COMMUNITY)
Admission: EM | Admit: 2015-03-11 | Discharge: 2015-04-07 | DRG: 004 | Disposition: A | Discharge status: Other Acute Inpatient Hospital | Payer: Medicare Other | Attending: Pulmonary Disease | Admitting: Pulmonary Disease

## 2015-03-11 ENCOUNTER — Emergency Department (HOSPITAL_COMMUNITY): Payer: Medicare Other

## 2015-03-11 ENCOUNTER — Encounter (HOSPITAL_COMMUNITY): Payer: Self-pay | Admitting: Emergency Medicine

## 2015-03-11 DIAGNOSIS — G4733 Obstructive sleep apnea (adult) (pediatric): Secondary | ICD-10-CM | POA: Diagnosis present

## 2015-03-11 DIAGNOSIS — S27309S Unspecified injury of lung, unspecified, sequela: Secondary | ICD-10-CM | POA: Diagnosis not present

## 2015-03-11 DIAGNOSIS — J9601 Acute respiratory failure with hypoxia: Secondary | ICD-10-CM | POA: Diagnosis present

## 2015-03-11 DIAGNOSIS — A419 Sepsis, unspecified organism: Secondary | ICD-10-CM | POA: Diagnosis not present

## 2015-03-11 DIAGNOSIS — R0902 Hypoxemia: Secondary | ICD-10-CM | POA: Diagnosis present

## 2015-03-11 DIAGNOSIS — Z6833 Body mass index (BMI) 33.0-33.9, adult: Secondary | ICD-10-CM | POA: Diagnosis not present

## 2015-03-11 DIAGNOSIS — N170 Acute kidney failure with tubular necrosis: Secondary | ICD-10-CM | POA: Diagnosis not present

## 2015-03-11 DIAGNOSIS — I5030 Unspecified diastolic (congestive) heart failure: Secondary | ICD-10-CM | POA: Diagnosis present

## 2015-03-11 DIAGNOSIS — R739 Hyperglycemia, unspecified: Secondary | ICD-10-CM | POA: Diagnosis present

## 2015-03-11 DIAGNOSIS — T380X5A Adverse effect of glucocorticoids and synthetic analogues, initial encounter: Secondary | ICD-10-CM | POA: Diagnosis present

## 2015-03-11 DIAGNOSIS — S27309A Unspecified injury of lung, unspecified, initial encounter: Secondary | ICD-10-CM | POA: Diagnosis not present

## 2015-03-11 DIAGNOSIS — J189 Pneumonia, unspecified organism: Principal | ICD-10-CM | POA: Diagnosis present

## 2015-03-11 DIAGNOSIS — J9600 Acute respiratory failure, unspecified whether with hypoxia or hypercapnia: Secondary | ICD-10-CM | POA: Insufficient documentation

## 2015-03-11 DIAGNOSIS — J9690 Respiratory failure, unspecified, unspecified whether with hypoxia or hypercapnia: Secondary | ICD-10-CM

## 2015-03-11 DIAGNOSIS — T40605A Adverse effect of unspecified narcotics, initial encounter: Secondary | ICD-10-CM | POA: Diagnosis not present

## 2015-03-11 DIAGNOSIS — F840 Autistic disorder: Secondary | ICD-10-CM | POA: Diagnosis present

## 2015-03-11 DIAGNOSIS — S27309D Unspecified injury of lung, unspecified, subsequent encounter: Secondary | ICD-10-CM | POA: Diagnosis not present

## 2015-03-11 DIAGNOSIS — N179 Acute kidney failure, unspecified: Secondary | ICD-10-CM | POA: Diagnosis not present

## 2015-03-11 DIAGNOSIS — E872 Acidosis: Secondary | ICD-10-CM | POA: Diagnosis not present

## 2015-03-11 DIAGNOSIS — R6521 Severe sepsis with septic shock: Secondary | ICD-10-CM | POA: Diagnosis not present

## 2015-03-11 DIAGNOSIS — R918 Other nonspecific abnormal finding of lung field: Secondary | ICD-10-CM

## 2015-03-11 DIAGNOSIS — E669 Obesity, unspecified: Secondary | ICD-10-CM | POA: Diagnosis present

## 2015-03-11 DIAGNOSIS — R509 Fever, unspecified: Secondary | ICD-10-CM | POA: Diagnosis not present

## 2015-03-11 DIAGNOSIS — Y95 Nosocomial condition: Secondary | ICD-10-CM | POA: Diagnosis present

## 2015-03-11 DIAGNOSIS — E875 Hyperkalemia: Secondary | ICD-10-CM | POA: Diagnosis present

## 2015-03-11 DIAGNOSIS — N289 Disorder of kidney and ureter, unspecified: Secondary | ICD-10-CM

## 2015-03-11 DIAGNOSIS — Z4659 Encounter for fitting and adjustment of other gastrointestinal appliance and device: Secondary | ICD-10-CM

## 2015-03-11 DIAGNOSIS — B379 Candidiasis, unspecified: Secondary | ICD-10-CM | POA: Diagnosis not present

## 2015-03-11 DIAGNOSIS — G9340 Encephalopathy, unspecified: Secondary | ICD-10-CM | POA: Diagnosis not present

## 2015-03-11 DIAGNOSIS — D696 Thrombocytopenia, unspecified: Secondary | ICD-10-CM | POA: Diagnosis present

## 2015-03-11 DIAGNOSIS — I5189 Other ill-defined heart diseases: Secondary | ICD-10-CM | POA: Diagnosis present

## 2015-03-11 DIAGNOSIS — J80 Acute respiratory distress syndrome: Secondary | ICD-10-CM | POA: Diagnosis present

## 2015-03-11 DIAGNOSIS — Z9289 Personal history of other medical treatment: Secondary | ICD-10-CM

## 2015-03-11 DIAGNOSIS — Z7952 Long term (current) use of systemic steroids: Secondary | ICD-10-CM

## 2015-03-11 DIAGNOSIS — D573 Sickle-cell trait: Secondary | ICD-10-CM | POA: Diagnosis present

## 2015-03-11 DIAGNOSIS — D72829 Elevated white blood cell count, unspecified: Secondary | ICD-10-CM | POA: Diagnosis present

## 2015-03-11 DIAGNOSIS — K5903 Drug induced constipation: Secondary | ICD-10-CM | POA: Diagnosis not present

## 2015-03-11 DIAGNOSIS — Z452 Encounter for adjustment and management of vascular access device: Secondary | ICD-10-CM

## 2015-03-11 DIAGNOSIS — I519 Heart disease, unspecified: Secondary | ICD-10-CM | POA: Diagnosis not present

## 2015-03-11 DIAGNOSIS — Z930 Tracheostomy status: Secondary | ICD-10-CM | POA: Diagnosis not present

## 2015-03-11 DIAGNOSIS — R7309 Other abnormal glucose: Secondary | ICD-10-CM | POA: Diagnosis not present

## 2015-03-11 DIAGNOSIS — Z9911 Dependence on respirator [ventilator] status: Secondary | ICD-10-CM | POA: Diagnosis not present

## 2015-03-11 DIAGNOSIS — I82403 Acute embolism and thrombosis of unspecified deep veins of lower extremity, bilateral: Secondary | ICD-10-CM | POA: Diagnosis not present

## 2015-03-11 DIAGNOSIS — Z430 Encounter for attention to tracheostomy: Secondary | ICD-10-CM

## 2015-03-11 DIAGNOSIS — R34 Anuria and oliguria: Secondary | ICD-10-CM | POA: Diagnosis not present

## 2015-03-11 DIAGNOSIS — Z8701 Personal history of pneumonia (recurrent): Secondary | ICD-10-CM

## 2015-03-11 DIAGNOSIS — R0602 Shortness of breath: Secondary | ICD-10-CM | POA: Diagnosis not present

## 2015-03-11 DIAGNOSIS — Z8249 Family history of ischemic heart disease and other diseases of the circulatory system: Secondary | ICD-10-CM

## 2015-03-11 DIAGNOSIS — N186 End stage renal disease: Secondary | ICD-10-CM | POA: Diagnosis not present

## 2015-03-11 DIAGNOSIS — R5381 Other malaise: Secondary | ICD-10-CM | POA: Diagnosis not present

## 2015-03-11 DIAGNOSIS — L8990 Pressure ulcer of unspecified site, unspecified stage: Secondary | ICD-10-CM | POA: Insufficient documentation

## 2015-03-11 LAB — BASIC METABOLIC PANEL
Anion gap: 11 (ref 5–15)
BUN: 15 mg/dL (ref 6–20)
CO2: 30 mmol/L (ref 22–32)
Calcium: 8.7 mg/dL — ABNORMAL LOW (ref 8.9–10.3)
Chloride: 99 mmol/L — ABNORMAL LOW (ref 101–111)
Creatinine, Ser: 1.01 mg/dL (ref 0.61–1.24)
GFR calc Af Amer: >60 mL/min (ref >60)
GLUCOSE: 123 mg/dL — AB (ref 65–99)
POTASSIUM: 3.8 mmol/L (ref 3.5–5.1)
Sodium: 140 mmol/L (ref 135–145)

## 2015-03-11 LAB — URINALYSIS, ROUTINE W REFLEX MICROSCOPIC
Bilirubin Urine: NEGATIVE
GLUCOSE, UA: NEGATIVE mg/dL
Hgb urine dipstick: NEGATIVE
Ketones, ur: NEGATIVE mg/dL
LEUKOCYTES UA: NEGATIVE
NITRITE: NEGATIVE
PROTEIN: NEGATIVE mg/dL
Specific Gravity, Urine: 1.007 (ref 1.005–1.030)
pH: 7 (ref 5.0–8.0)

## 2015-03-11 LAB — I-STAT ARTERIAL BLOOD GAS, ED
Acid-Base Excess: 3 mmol/L — ABNORMAL HIGH (ref 0.0–2.0)
BICARBONATE: 29.4 meq/L — AB (ref 20.0–24.0)
O2 Saturation: 92 %
TCO2: 31 mmol/L (ref 0–100)
pCO2 arterial: 48.5 mmHg — ABNORMAL HIGH (ref 35.0–45.0)
pH, Arterial: 7.391 (ref 7.350–7.450)
pO2, Arterial: 67 mmHg — ABNORMAL LOW (ref 80.0–100.0)

## 2015-03-11 LAB — CBC WITH DIFFERENTIAL/PLATELET
Basophils Absolute: 0 10*3/uL (ref 0.0–0.1)
Basophils Relative: 0 %
EOS PCT: 1 %
Eosinophils Absolute: 0.2 10*3/uL (ref 0.0–0.7)
HCT: 40.9 % (ref 39.0–52.0)
Hemoglobin: 13.8 g/dL (ref 13.0–17.0)
LYMPHS ABS: 2.7 10*3/uL (ref 0.7–4.0)
LYMPHS PCT: 12 %
MCH: 27.2 pg (ref 26.0–34.0)
MCHC: 33.7 g/dL (ref 30.0–36.0)
MCV: 80.7 fL (ref 78.0–100.0)
MONO ABS: 2.4 10*3/uL — AB (ref 0.1–1.0)
MONOS PCT: 10 %
Neutro Abs: 17.6 10*3/uL — ABNORMAL HIGH (ref 1.7–7.7)
Neutrophils Relative %: 77 %
PLATELETS: 147 10*3/uL — AB (ref 150–400)
RBC: 5.07 MIL/uL (ref 4.22–5.81)
RDW: 15.7 % — AB (ref 11.5–15.5)
WBC: 22.9 10*3/uL — ABNORMAL HIGH (ref 4.0–10.5)

## 2015-03-11 LAB — INFLUENZA PANEL BY PCR (TYPE A & B)
H1N1FLUPCR: NOT DETECTED
Influenza A By PCR: NEGATIVE
Influenza B By PCR: NEGATIVE

## 2015-03-11 LAB — PHOSPHORUS: Phosphorus: 3.9 mg/dL (ref 2.5–4.6)

## 2015-03-11 LAB — MAGNESIUM: MAGNESIUM: 2.2 mg/dL (ref 1.7–2.4)

## 2015-03-11 LAB — BRAIN NATRIURETIC PEPTIDE: B Natriuretic Peptide: 20.6 pg/mL (ref 0.0–100.0)

## 2015-03-11 LAB — PROCALCITONIN

## 2015-03-11 LAB — LACTIC ACID, PLASMA
LACTIC ACID, VENOUS: 2.1 mmol/L — AB (ref 0.5–2.0)
LACTIC ACID, VENOUS: 1.6 mmol/L (ref 0.5–2.0)

## 2015-03-11 LAB — MRSA PCR SCREENING: MRSA BY PCR: NEGATIVE

## 2015-03-11 LAB — STREP PNEUMONIAE URINARY ANTIGEN: Strep Pneumo Urinary Antigen: NEGATIVE

## 2015-03-11 LAB — HIV ANTIBODY (ROUTINE TESTING W REFLEX): HIV SCREEN 4TH GENERATION: NONREACTIVE

## 2015-03-11 MED ORDER — DEXTROSE 5 % IV SOLN
1.0000 g | Freq: Three times a day (TID) | INTRAVENOUS | Status: DC
  Filled 2015-03-11 (×3): qty 1

## 2015-03-11 MED ORDER — ONDANSETRON HCL 4 MG PO TABS: 4.0000 mg | ORAL_TABLET | Freq: Four times a day (QID) | ORAL | Status: DC | PRN

## 2015-03-11 MED ORDER — GUAIFENESIN ER 600 MG PO TB12
600.0000 mg | ORAL_TABLET | Freq: Two times a day (BID) | ORAL | Status: DC | PRN
  Filled 2015-03-11: qty 1

## 2015-03-11 MED ORDER — ACETAMINOPHEN 650 MG RE SUPP: 650.0000 mg | RECTAL | Status: DC | PRN

## 2015-03-11 MED ORDER — POTASSIUM CHLORIDE 20 MEQ/15ML (10%) PO SOLN
20.0000 meq | Freq: Two times a day (BID) | ORAL | Status: AC
  Administered 2015-03-11: 20 meq via ORAL
  Filled 2015-03-11 (×2): qty 15

## 2015-03-11 MED ORDER — VANCOMYCIN HCL 10 G IV SOLR
2000.0000 mg | Freq: Once | INTRAVENOUS | Status: AC
  Administered 2015-03-11: 2000 mg via INTRAVENOUS
  Filled 2015-03-11: qty 2000

## 2015-03-11 MED ORDER — METHYLPREDNISOLONE SODIUM SUCC 125 MG IJ SOLR
60.0000 mg | Freq: Two times a day (BID) | INTRAMUSCULAR | Status: DC
  Administered 2015-03-11 – 2015-03-13 (×5): 60 mg via INTRAVENOUS
  Filled 2015-03-11 (×3): qty 2
  Filled 2015-03-11: qty 0.96
  Filled 2015-03-11: qty 1
  Filled 2015-03-11: qty 0.96
  Filled 2015-03-11: qty 2

## 2015-03-11 MED ORDER — WHITE PETROLATUM GEL
Status: DC | PRN
  Filled 2015-03-11: qty 1
  Filled 2015-03-11: qty 28.35

## 2015-03-11 MED ORDER — IPRATROPIUM-ALBUTEROL 0.5-2.5 (3) MG/3ML IN SOLN
3.0000 mL | Freq: Once | RESPIRATORY_TRACT | Status: AC
  Administered 2015-03-11: 3 mL via RESPIRATORY_TRACT
  Filled 2015-03-11: qty 3

## 2015-03-11 MED ORDER — FUROSEMIDE 10 MG/ML IJ SOLN
20.0000 mg | Freq: Two times a day (BID) | INTRAMUSCULAR | Status: DC
  Administered 2015-03-11 – 2015-03-12 (×2): 20 mg via INTRAVENOUS
  Filled 2015-03-11 (×2): qty 2

## 2015-03-11 MED ORDER — DEXTROSE 5 % IV SOLN
2.0000 g | Freq: Three times a day (TID) | INTRAVENOUS | Status: AC
  Administered 2015-03-11 – 2015-03-18 (×23): 2 g via INTRAVENOUS
  Filled 2015-03-11 (×24): qty 2

## 2015-03-11 MED ORDER — ENOXAPARIN SODIUM 40 MG/0.4ML ~~LOC~~ SOLN
40.0000 mg | SUBCUTANEOUS | Status: DC
  Administered 2015-03-11 – 2015-03-13 (×3): 40 mg via SUBCUTANEOUS
  Filled 2015-03-11 (×3): qty 0.4

## 2015-03-11 MED ORDER — ACETAMINOPHEN 160 MG/5ML PO SOLN
650.0000 mg | Freq: Four times a day (QID) | ORAL | Status: DC | PRN
  Administered 2015-03-15 (×2): 650 mg via ORAL
  Filled 2015-03-11 (×2): qty 20.3

## 2015-03-11 MED ORDER — IPRATROPIUM-ALBUTEROL 0.5-2.5 (3) MG/3ML IN SOLN
3.0000 mL | RESPIRATORY_TRACT | Status: DC
  Administered 2015-03-11 – 2015-04-06 (×154): 3 mL via RESPIRATORY_TRACT
  Filled 2015-03-11 (×151): qty 3

## 2015-03-11 MED ORDER — FUROSEMIDE 10 MG/ML IJ SOLN
20.0000 mg | Freq: Once | INTRAMUSCULAR | Status: AC
  Administered 2015-03-11: 20 mg via INTRAVENOUS
  Filled 2015-03-11: qty 2

## 2015-03-11 MED ORDER — SODIUM CHLORIDE 0.9% FLUSH
3.0000 mL | Freq: Two times a day (BID) | INTRAVENOUS | Status: DC
  Administered 2015-03-11 – 2015-03-13 (×4): 3 mL via INTRAVENOUS
  Administered 2015-03-14: 10 mL via INTRAVENOUS
  Administered 2015-03-15 – 2015-04-06 (×44): 3 mL via INTRAVENOUS

## 2015-03-11 MED ORDER — VANCOMYCIN HCL IN DEXTROSE 1-5 GM/200ML-% IV SOLN
1000.0000 mg | Freq: Three times a day (TID) | INTRAVENOUS | Status: DC
  Administered 2015-03-11 – 2015-03-12 (×3): 1000 mg via INTRAVENOUS
  Filled 2015-03-11 (×5): qty 200

## 2015-03-11 MED ORDER — ONDANSETRON HCL 4 MG/2ML IJ SOLN: 4.0000 mg | Freq: Four times a day (QID) | INTRAMUSCULAR | Status: DC | PRN

## 2015-03-11 MED ORDER — ACETAMINOPHEN 325 MG PO TABS
650.0000 mg | ORAL_TABLET | Freq: Once | ORAL | Status: AC
  Administered 2015-03-11: 650 mg via ORAL
  Filled 2015-03-11: qty 2

## 2015-03-11 MED ORDER — LORAZEPAM 2 MG/ML IJ SOLN
1.0000 mg | Freq: Once | INTRAMUSCULAR | Status: AC
  Administered 2015-03-11: 1 mg via INTRAVENOUS
  Filled 2015-03-11: qty 1

## 2015-03-11 NOTE — ED Notes (Signed)
Pt c/o headache-- wants BiPap off-- explained that his oxygen is too low, will give tylenol.

## 2015-03-11 NOTE — ED Notes (Signed)
Family at bedside. 

## 2015-03-11 NOTE — ED Notes (Signed)
Pt brought to ED by [facility] for SOB pt was dc yesterday after 10 days of admission for PNA, pt taking prednisone, Musinex and inhaler, but still unable to keep his SPO2 levels above 88 on O2 [place], pt is now 91% on 15L on NRM., BP 112/85, HR 116, R, 36 ST on monitor. Pt denies any pain at this time.

## 2015-03-11 NOTE — Progress Notes (Signed)
Pt sister courtney requesting to see Dr.Feinsteen, in lieu of the pulmonary doctor that was following him the last admissioin

## 2015-03-11 NOTE — Progress Notes (Addendum)
Repeat lactic acid down to 1.6, influenza PCR negative, respiratory viral panel pending. Urine output 1100 mL after 20 mg IV Lasix per EDP. Was also given 1000 mL normal saline by EDP.  Junious Silk, ANP

## 2015-03-11 NOTE — Procedures (Signed)
Pt RR-47, SpO2-90 on NRB.  RT placed pt on BIPAP.

## 2015-03-11 NOTE — ED Provider Notes (Signed)
Care in conjunction with Dr. Wilkie Aye during the end of her shift, she asked that I assist in the care of the pt since it was a pt she picked up late in her shift and some of the work up was pending at shift change.  Pt here with hypoxia at home, was admitted 2/18-2/27 for multifocal PNA and acute lung injury, sent home on 4L home oxygen, but sister states that he desatted into the 50's on this home oxygen when getting up to go to the bathroom.  Here he is requiring 15L via NRB to maintain oxygen >90%, ABG showed mild hypercarbia and hypoxia so he was placed on bipap for some PEEP.  He complains that he doesn't feel well and is SOB but denies CP.  Results so far show ABG as mentioned previously, BMP grossly WNL, and BNP WNL. CXR with similar appearance as that taken on 2/27, again with bibasilar opacities, slight pleural effusion suspected. Awaiting CBC w/diff.  Plan is to discuss case with [facility] hospitalist once CBC returns, to discuss re-admission. Have not yet started empiric abx, since he had abx throughout his hospitalization and was not discharged on any, will discuss this with admission team. Was given small amount of lasix since we suspected that he was likely fluid resuscitated during his hospitalization.  Of note, he had a CTA on 2/20 which was neg for PE, and an echo with normal EF during his hospitalization. His flu test was neg.   Physical Exam  BP 138/78 mmHg  Pulse 114  Temp(Src) 99 F (37.2 C) (Oral)  Resp 38  Ht 5\' 9"  (1.753 m)  Wt 102.513 kg  BMI 33.36 kg/m2  SpO2 93%  Physical Exam Gen: low-grade temp 99, tachycardic and tachypneic, appears slightly uncomfortable but awake and alert HEENT: EOMI, MMM Resp: tachypneic with RR 30s, rales in lower lung fields with diminished breath sounds throughout all lung fields, no obvious wheezing or rhonchi, hypoxic on RA but SpO2 93% on 15L via NRB, speaks in fragments  CV: tachycardic, reg rhythm, nl s1/s2, no m/r/g, no pedal edema, distal  pulses intact Abd: appearance normal, nondistended MsK: moving all extremities with ease Neuro: A&O x4  ED Course  Procedures Results for orders placed or performed during the hospital encounter of 03/11/15  CBC with Differential  Result Value Ref Range   WBC 22.9 (H) 4.0 - 10.5 K/uL   RBC 5.07 4.22 - 5.81 MIL/uL   Hemoglobin 13.8 13.0 - 17.0 g/dL   HCT 16.1 09.6 - 04.5 %   MCV 80.7 78.0 - 100.0 fL   MCH 27.2 26.0 - 34.0 pg   MCHC 33.7 30.0 - 36.0 g/dL   RDW 40.9 (H) 81.1 - 91.4 %   Platelets 147 (L) 150 - 400 K/uL   Neutrophils Relative % 77 %   Neutro Abs 17.6 (H) 1.7 - 7.7 K/uL   Lymphocytes Relative 12 %   Lymphs Abs 2.7 0.7 - 4.0 K/uL   Monocytes Relative 10 %   Monocytes Absolute 2.4 (H) 0.1 - 1.0 K/uL   Eosinophils Relative 1 %   Eosinophils Absolute 0.2 0.0 - 0.7 K/uL   Basophils Relative 0 %   Basophils Absolute 0.0 0.0 - 0.1 K/uL  Basic metabolic panel  Result Value Ref Range   Sodium 140 135 - 145 mmol/L   Potassium 3.8 3.5 - 5.1 mmol/L   Chloride 99 (L) 101 - 111 mmol/L   CO2 30 22 - 32 mmol/L   Glucose, Bld  123 (H) 65 - 99 mg/dL   BUN 15 6 - 20 mg/dL   Creatinine, Ser 3.66 0.61 - 1.24 mg/dL   Calcium 8.7 (L) 8.9 - 10.3 mg/dL   GFR calc non Af Amer >60 >60 mL/min   GFR calc Af Amer >60 >60 mL/min   Anion gap 11 5 - 15  Brain natriuretic peptide  Result Value Ref Range   B Natriuretic Peptide 20.6 0.0 - 100.0 pg/mL  I-Stat Arterial Blood Gas, ED - (order at [facility] and [facility] only)  Result Value Ref Range   pH, Arterial 7.391 7.350 - 7.450   pCO2 arterial 48.5 (H) 35.0 - 45.0 mmHg   pO2, Arterial 67.0 (L) 80.0 - 100.0 mmHg   Bicarbonate 29.4 (H) 20.0 - 24.0 mEq/L   TCO2 31 0 - 100 mmol/L   O2 Saturation 92.0 %   Acid-Base Excess 3.0 (H) 0.0 - 2.0 mmol/L   Patient temperature 98.6 F    Collection site RADIAL, ALLEN'S TEST ACCEPTABLE    Drawn by RT    Sample type ARTERIAL    Dg Chest 2 View  03/10/2015  CLINICAL DATA:  Fever.  Chest pain. EXAM: CHEST  2  VIEW COMPARISON:  03/07/2015 chest radiograph. FINDINGS: Low lung volumes. Stable cardiomediastinal silhouette with normal heart size. No pneumothorax. No definite pleural effusions. Patchy bibasilar lung consolidation, not appreciably changed. No overt pulmonary edema. IMPRESSION: Low lung volumes with stable patchy bibasilar lung consolidation, suspicious for a combination of atelectasis and pneumonia. Electronically Signed   By: Delbert Phenix M.D.   On: 03/10/2015 13:07   Dg Chest 2 View  03/05/2015  CLINICAL DATA:  Hypoxia, cough, shortness of breath, fever EXAM: CHEST  2 VIEW COMPARISON:  03/03/2015 FINDINGS: Cardiomediastinal silhouette is stable. No pulmonary edema. Persistent patchy bilateral lower lobe infiltrates left greater than right. Question tiny bilateral pleural effusion. IMPRESSION: Persistent patchy bilateral lower lobe infiltrates left greater than right. Question tiny bilateral pleural effusion. Electronically Signed   By: Natasha Mead M.D.   On: 03/05/2015 13:04   Dg Chest 2 View  03/02/2015  CLINICAL DATA:  Dyspnea EXAM: CHEST  2 VIEW COMPARISON:  02/28/2015 chest radiograph. FINDINGS: Low lung volumes. Stable cardiomediastinal silhouette with normal heart size. No pneumothorax. Small bilateral pleural effusions, stable on the right and slightly increased on the left. Extensive patchy consolidation at both lung bases, slightly worsened on the left. No pulmonary edema. IMPRESSION: 1. Low lung volumes. Extensive patchy bibasilar lung consolidation, slightly worsened on the left, favor multifocal pneumonia. 2. Small bilateral pleural effusions, stable on the right and increased on the left. Electronically Signed   By: Delbert Phenix M.D.   On: 03/02/2015 09:27   Dg Chest 2 View  02/28/2015  CLINICAL DATA:  Cough and chest tightness for 1 week. Rapid respirations. Initial encounter. EXAM: CHEST  2 VIEW COMPARISON:  None. FINDINGS: There is bilateral lower lobe and right middle lobe airspace  disease most consistent with pneumonia. No pneumothorax or pleural effusion is identified. Heart size is normal. Lung volumes are low. IMPRESSION: Extensive bilateral lower lobe and right middle lobe airspace disease most consistent with pneumonia. Electronically Signed   By: Drusilla Kanner M.D.   On: 02/28/2015 11:14   Ct Angio Chest Pe W/cm &/or Wo Cm  03/03/2015  CLINICAL DATA:  40 year old male with shortness of breath and chest pain. EXAM: CT ANGIOGRAPHY CHEST WITH CONTRAST recommended. TECHNIQUE: Multidetector CT imaging of the chest was performed using the standard protocol during  bolus administration of intravenous contrast. Multiplanar CT image reconstructions and MIPs were obtained to evaluate the vascular anatomy. CONTRAST:  80mL OMNIPAQUE IOHEXOL 350 MG/ML SOLN COMPARISON:  Radiograph dated 03/02/2015 FINDINGS: There are bilateral patchy areas of airspace consolidation with air bronchogram predominantly involving the lung bases most compatible with multifocal pneumonia. Multiple ground-glass and nodular densities noted in the left apical region. There is no pleural effusion or pneumothorax. The central airways are patent. The thoracic aorta appears unremarkable. Scratch no CT evidence of pulmonary embolism. Top-normal cardiac size. No pericardial effusion. Subcarinal lymph node measures 14 mm in short axis. The esophagus is grossly unremarkable. No thyroid nodule identified. There is no axillary adenopathy. The chest wall soft tissues appear unremarkable. The osseous structures are intact. The visualized upper abdomen appear unremarkable Review of the MIP images confirms the above findings. IMPRESSION: No CT evidence of pulmonary embolism. Multi focal pneumonia. Correlation with clinical exam and patient's symptomatology recommended to exclude acute chest syndrome. Follow-up recommended. Electronically Signed   By: Elgie Collard M.D.   On: 03/03/2015 16:12   Dg Chest Portable 1  View  03/11/2015  CLINICAL DATA:  Acute onset of shortness of breath. Decreased O2 saturation. Initial encounter. EXAM: PORTABLE CHEST 1 VIEW COMPARISON:  Chest radiograph performed 03/10/2015 FINDINGS: The lungs are hypoexpanded. Bibasilar airspace opacification may reflect pneumonia or possibly pulmonary edema. A small left pleural effusion is suspected. No pneumothorax is seen. The cardiomediastinal silhouette is borderline normal in size. No acute osseous abnormalities are identified. IMPRESSION: Lungs hypoexpanded. Bibasilar airspace opacities may reflect pneumonia or possibly pulmonary edema, similar in appearance to the prior study. Small left pleural effusion suspected. Electronically Signed   By: Roanna Raider M.D.   On: 03/11/2015 05:54   Dg Chest Port 1 View  03/07/2015  CLINICAL DATA:  Pneumonia EXAM: PORTABLE CHEST 1 VIEW COMPARISON:  03/05/2015 FINDINGS: 1612 hours. Lung volumes are low, as before. There is persistent bibasilar atelectasis or pneumonia, slightly progressed in the interval. Cardiopericardial silhouette is at upper limits of normal for size. Low volume film crowds the central pulmonary vasculature. IMPRESSION: Low volumes with slight increase in bibasilar atelectasis or pneumonia. Electronically Signed   By: Kennith Center M.D.   On: 03/07/2015 16:20     Meds ordered this encounter  Medications  . ipratropium-albuterol (DUONEB) 0.5-2.5 (3) MG/3ML nebulizer solution 3 mL    Sig:   . albuterol (PROVENTIL) (2.5 MG/3ML) 0.083% nebulizer solution    Sig: Take 2.5 mg by nebulization every 6 (six) hours as needed for wheezing or shortness of breath.  . furosemide (LASIX) injection 20 mg    Sig:   . acetaminophen (TYLENOL) tablet 650 mg    Sig:   . white petrolatum (VASELINE) gel    Sig:    CRITICAL CARE- hypoxia needing bipap, sepsis Performed by: Ramond Marrow   Total critical care time: 30 minutes  Critical care time was exclusive of separately  billable procedures and treating other patients.  Critical care was necessary to treat or prevent imminent or life-threatening deterioration.  Critical care was time spent personally by me on the following activities: development of treatment plan with patient and/or surrogate as well as nursing, discussions with consultants, evaluation of patient's response to treatment, examination of patient, obtaining history from patient or surrogate, ordering and performing treatments and interventions, ordering and review of laboratory studies, ordering and review of radiographic studies, pulse oximetry and re-evaluation of patient's condition.   MDM:   ICD-9-CM ICD-10-CM  1. Hypoxia 799.02 R09.02   2. History of pneumonia V12.61 Z87.01   3. Acute respiratory failure with hypoxia (HCC) 518.81 J96.01   4. Leukocytosis 288.60 D72.829   5. Other specified fever 780.60 R50.9     8:06 AM Temp increased to 100.3. Pt complaining that his head hurts, likely from the fever. Will give tylenol. Of note, his flu test was neg but will re-swab. His CBC returned and he has a leukocytosis of 22.9 which could be from demargination given the prednisone/steroid use. He had no labs done during his hospitalization so his last CBC was 2/18 (date of admit) which showed a WBC of 8.0. Awaiting return consult call from [facility].  8:24 AM Junious Silk NP for [facility] returning page, will admit to stepdown. Holding orders placed. Please see her notes for further documentation of care.  BP 118/85 mmHg  Pulse 120  Temp(Src) 100.3 F (37.9 C) (Oral)  Resp 35  Ht  (1.753 m)  Wt 102.513 kg  BMI 33.36 kg/m2  SpO2 93%    Annalyse Langlais Camprubi-Soms, PA-C 03/11/15 0825  Shon Baton, MD 03/11/15 1622

## 2015-03-11 NOTE — H&P (Signed)
[facility] History and Physical                                                                                    Juan Cameron, is a 40 y.o. male  MRN: [id number]   DOB - 10-11-1975  Admit Date - 03/11/2015  Outpatient Primary MD for the patient is ASRES,ALEHEGN, MD  Referring MD: [place] / ER  PMH: Past Medical History  Diagnosis Date  . Autism   . Sickle cell trait (HCC)   . Pneumonia 02/2015  . Autism       PSH: Past Surgical History  Procedure Laterality Date  . No past surgeries       CC:  Chief Complaint  Patient presents with  . Shortness of Breath     HPI: 40 year old male patient past history of autism and sickle cell trait. Recently discharged yesterday 2/27 after admission for acute hypoxemic respiratory failure felt to be related to multifocal community acquired pneumonia. During the hospitalization there was a concern patient may be volume overloaded and an echocardiogram was completed that revealed preserved LV function but grade 2 diastolic dysfunction. Patient did not have a history of hypertension. He had also been evaluated by critical care pulmonary medicine who felt the patient had a degree of acute lung injury and recommended starting steroid taper and follow up with the pulmonologist one week after discharge. He still remained hypoxemic and was discharged home on 4 L oxygen. He had completed antibiotic therapy per to discharge. Unfortunately since discharge patient has had persistent and now worsening shortness of breath. His sister noted that despite utilizing oxygen he was desaturating into the low 80s with ambulation. After an episode of where he desaturated to the 50s after going to the bathroom the sister opted to bring the patient to the hospital for further evaluation.  ER Evaluation and treatment: MAXIMUM TEMPERATURE since arrival has been 100.3. On 10 L oxygen with O2 saturations 88% which improved to 92% on 100% NRB but patient was not  taking deep breaths that would he was subsequently placed on BiPAP. Patient was complaining of headache from the BiPAP apparatus so BiPAP was removed and patient was placed back on 100% NRB with saturations between 89 and 92%. He is not hypotensive but he remains tachypneic and tachycardic heart rate 35 and pulse 120 despite treatment with oxygen IV fluids and subsequently Lasix. PCXR: Increased superior vascular markings concerning for volume overload, low lung volumes bilaterally with associated bibasilar airspace opacities that could reflect either pneumonia or edema Laboratory data: Na 140, K 3.8, BUN 15, Cr 1.01, glucose 123, BNP 20.6, WBC 22,900, hemoglobin 13.8, platelets 147,000; abg: PH 7.39, PCO2 48.5, PO2 67, bicarbonate 29.4, ABE 3. Duo neb 1 treatment Lasix 20 mg IV 1 Tylenol 650 mg by mouth 1  Review of Systems   In addition to the HPI above,  No changes with Vision or hearing, new weakness, tingling, numbness in any extremity, No problems swallowing food or Liquids, indigestion/reflux No Chest pain, palpitations, orthopnea  No Abdominal pain, N/V; no melena or hematochezia, no dark tarry stools, Bowel movements are regular, No dysuria, hematuria or flank pain  No new skin rashes, lesions, masses or bruises, No new joints pains-aches No recent weight gain or loss No polyuria, polydypsia or polyphagia,  *A full 10 point Review of Systems was done, except as stated above, all other Review of Systems were negative.  Social History Social History  Substance Use Topics  . Smoking status: Never Smoker   . Smokeless tobacco: Never Used  . Alcohol Use: No    Resides at: Private residence  Lives with: sister  Ambulatory status: Without assistive devices   Family History Family History  Problem Relation Age of Onset  . Heart attack Mother      Prior to Admission medications   Medication Sig Start Date End Date Taking? Authorizing Provider  albuterol (PROAIR HFA) 108  (90 Base) MCG/ACT inhaler Inhale 2 puffs into the lungs every 6 (six) hours as needed for wheezing or shortness of breath.  02/27/15  Yes Historical Provider, MD  albuterol (PROVENTIL) (2.5 MG/3ML) 0.083% nebulizer solution Take 2.5 mg by nebulization every 6 (six) hours as needed for wheezing or shortness of breath.   Yes Historical Provider, MD  guaiFENesin (MUCINEX) 600 MG 12 hr tablet Take 600 mg by mouth 2 (two) times daily as needed for cough or to loosen phlegm.   Yes Historical Provider, MD  predniSONE (DELTASONE) 20 MG tablet Take 2 tablets (40 mg total) by mouth daily with breakfast. 40 mg for 3 days then 20 mg for 4 days then 10 mg for 2 days 03/10/15  Yes Costin Otelia Sergeant, MD    Allergies  Allergen Reactions  . Peanuts [Peanut Oil] Anaphylaxis    Physical Exam  Vitals  Blood pressure 118/85, pulse 120, temperature 100.3 F (37.9 C), temperature source Oral, resp. rate 35, height 5\' 9"  (1.753 m), weight 226 lb (102.513 kg), SpO2 93 %.   General:  InModerate distress as evidenced by ongoing tachypnea and tachycardia  Psych:  Normal affect,  Awake Alert, Oriented X 3. Speech and thought patterns are clear and appropriate, no apparent short term memory deficits  Neuro:   No focal neurological deficits, CN II through XII intact, Strength 5/5 all 4 extremities, Sensation intact all 4 extremities.  ENT:  Ears and Eyes appear Normal, Conjunctivae clear sclerae injected bilaterally, PER. Moist oral mucosa without erythema or exudates.  Neck:  Supple, No lymphadenopathy appreciated  Respiratory:  Symmetrical chest wall movement, lung sounds decreased at bilateral bases, expiratory crackles without wheezes, 100% and RV with sats between 89 and 92%  Cardiac:  RRR with tachycardia, No Murmurs, no LE edema noted, no JVD, No carotid bruits, peripheral pulses palpable at 2+  Abdomen:  Positive bowel sounds, Soft, Non tender, Non distended,  No masses appreciated, no obvious  hepatosplenomegaly  Skin:  No Cyanosis, Normal Skin Turgor, No Skin Rash or Bruise.  Extremities: Symmetrical without obvious trauma or injury,  no effusions.  Data Review  CBC  Recent Labs Lab 03/11/15 0630  WBC 22.9*  HGB 13.8  HCT 40.9  PLT 147*  MCV 80.7  MCH 27.2  MCHC 33.7  RDW 15.7*  LYMPHSABS 2.7  MONOABS 2.4*  EOSABS 0.2  BASOSABS 0.0    Chemistries   Recent Labs Lab 03/07/15 0738 03/11/15 0630  NA 137 140  K 3.9 3.8  CL 98* 99*  CO2 31 30  GLUCOSE 101* 123*  BUN 18 15  CREATININE 1.11 1.01  CALCIUM 8.5* 8.7*    estimated creatinine clearance is 118.1 mL/min (by C-G formula based on Cr  of 1.01).  No results for input(s): TSH, T4TOTAL, T3FREE, THYROIDAB in the last 72 hours.  Invalid input(s): FREET3  Coagulation profile No results for input(s): INR, PROTIME in the last 168 hours.  No results for input(s): DDIMER in the last 72 hours.  Cardiac Enzymes No results for input(s): CKMB, TROPONINI, MYOGLOBIN in the last 168 hours.  Invalid input(s): CK  Invalid input(s): POCBNP  Urinalysis No results found for: COLORURINE, APPEARANCEUR, LABSPEC, PHURINE, GLUCOSEU, HGBUR, BILIRUBINUR, KETONESUR, PROTEINUR, UROBILINOGEN, NITRITE, LEUKOCYTESUR  Imaging results:   Dg Chest 2 View  03/10/2015  CLINICAL DATA:  Fever.  Chest pain. EXAM: CHEST  2 VIEW COMPARISON:  03/07/2015 chest radiograph. FINDINGS: Low lung volumes. Stable cardiomediastinal silhouette with normal heart size. No pneumothorax. No definite pleural effusions. Patchy bibasilar lung consolidation, not appreciably changed. No overt pulmonary edema. IMPRESSION: Low lung volumes with stable patchy bibasilar lung consolidation, suspicious for a combination of atelectasis and pneumonia. Electronically Signed   By: Delbert Phenix M.D.   On: 03/10/2015 13:07   Dg Chest 2 View  03/05/2015  CLINICAL DATA:  Hypoxia, cough, shortness of breath, fever EXAM: CHEST  2 VIEW COMPARISON:  03/03/2015  FINDINGS: Cardiomediastinal silhouette is stable. No pulmonary edema. Persistent patchy bilateral lower lobe infiltrates left greater than right. Question tiny bilateral pleural effusion. IMPRESSION: Persistent patchy bilateral lower lobe infiltrates left greater than right. Question tiny bilateral pleural effusion. Electronically Signed   By: Natasha Mead M.D.   On: 03/05/2015 13:04   Dg Chest 2 View  03/02/2015  CLINICAL DATA:  Dyspnea EXAM: CHEST  2 VIEW COMPARISON:  02/28/2015 chest radiograph. FINDINGS: Low lung volumes. Stable cardiomediastinal silhouette with normal heart size. No pneumothorax. Small bilateral pleural effusions, stable on the right and slightly increased on the left. Extensive patchy consolidation at both lung bases, slightly worsened on the left. No pulmonary edema. IMPRESSION: 1. Low lung volumes. Extensive patchy bibasilar lung consolidation, slightly worsened on the left, favor multifocal pneumonia. 2. Small bilateral pleural effusions, stable on the right and increased on the left. Electronically Signed   By: Delbert Phenix M.D.   On: 03/02/2015 09:27   Dg Chest 2 View  02/28/2015  CLINICAL DATA:  Cough and chest tightness for 1 week. Rapid respirations. Initial encounter. EXAM: CHEST  2 VIEW COMPARISON:  None. FINDINGS: There is bilateral lower lobe and right middle lobe airspace disease most consistent with pneumonia. No pneumothorax or pleural effusion is identified. Heart size is normal. Lung volumes are low. IMPRESSION: Extensive bilateral lower lobe and right middle lobe airspace disease most consistent with pneumonia. Electronically Signed   By: Drusilla Kanner M.D.   On: 02/28/2015 11:14   Ct Angio Chest Pe W/cm &/or Wo Cm  03/03/2015  CLINICAL DATA:  40 year old male with shortness of breath and chest pain. EXAM: CT ANGIOGRAPHY CHEST WITH CONTRAST recommended. TECHNIQUE: Multidetector CT imaging of the chest was performed using the standard protocol during bolus  administration of intravenous contrast. Multiplanar CT image reconstructions and MIPs were obtained to evaluate the vascular anatomy. CONTRAST:  80mL OMNIPAQUE IOHEXOL 350 MG/ML SOLN COMPARISON:  Radiograph dated 03/02/2015 FINDINGS: There are bilateral patchy areas of airspace consolidation with air bronchogram predominantly involving the lung bases most compatible with multifocal pneumonia. Multiple ground-glass and nodular densities noted in the left apical region. There is no pleural effusion or pneumothorax. The central airways are patent. The thoracic aorta appears unremarkable. Scratch no CT evidence of pulmonary embolism. Top-normal cardiac size. No pericardial effusion. Subcarinal lymph  node measures 14 mm in short axis. The esophagus is grossly unremarkable. No thyroid nodule identified. There is no axillary adenopathy. The chest wall soft tissues appear unremarkable. The osseous structures are intact. The visualized upper abdomen appear unremarkable Review of the MIP images confirms the above findings. IMPRESSION: No CT evidence of pulmonary embolism. Multi focal pneumonia. Correlation with clinical exam and patient's symptomatology recommended to exclude acute chest syndrome. Follow-up recommended. Electronically Signed   By: Elgie Collard M.D.   On: 03/03/2015 16:12   Dg Chest Portable 1 View  03/11/2015  CLINICAL DATA:  Acute onset of shortness of breath. Decreased O2 saturation. Initial encounter. EXAM: PORTABLE CHEST 1 VIEW COMPARISON:  Chest radiograph performed 03/10/2015 FINDINGS: The lungs are hypoexpanded. Bibasilar airspace opacification may reflect pneumonia or possibly pulmonary edema. A small left pleural effusion is suspected. No pneumothorax is seen. The cardiomediastinal silhouette is borderline normal in size. No acute osseous abnormalities are identified. IMPRESSION: Lungs hypoexpanded. Bibasilar airspace opacities may reflect pneumonia or possibly pulmonary edema, similar in  appearance to the prior study. Small left pleural effusion suspected. Electronically Signed   By: Roanna Raider M.D.   On: 03/11/2015 05:54   Dg Chest Port 1 View  03/07/2015  CLINICAL DATA:  Pneumonia EXAM: PORTABLE CHEST 1 VIEW COMPARISON:  03/05/2015 FINDINGS: 1612 hours. Lung volumes are low, as before. There is persistent bibasilar atelectasis or pneumonia, slightly progressed in the interval. Cardiopericardial silhouette is at upper limits of normal for size. Low volume film crowds the central pulmonary vasculature. IMPRESSION: Low volumes with slight increase in bibasilar atelectasis or pneumonia. Electronically Signed   By: Kennith Center M.D.   On: 03/07/2015 16:20    Assessment & Plan  Principal Problem:   Acute respiratory failure with hypoxia:   A) HCAP    B) Acute lung injury   C) Left ventricular diastolic dysfunction, NYHA class 2 -Clearly a mixed picture: Symptoms appear consistent with both infectious process as well as a degree of diastolic heart failure (likely from ongoing recurrent hypoxemia at home despite utilization of prescribed oxygen)-also underlying acute lung injury prior to discharge -Admit to SDU/Inpatient -Patient not well tolerant of BiPAP but will continue prn -Broad spectrum antibiotics -Check influenza PCR, sputum culture and urinary Legionella -Supportive care with flutter valve and DuoNeb treatments -Had not been started on steroids until time of discharge; begin Solu-Medrol IV 60 mg q12 -Based on symptomatology and chest x-ray will treat with low-dose IV Lasix for likely transient heart failure exacerbation 3 more doses with supplemental potassium elixir  Active Problems:   SIRS -Currently does not meet criteria for sepsis but we'll check Procalcitonin and lactic acid and continue to monitor closely as above  **PCT <0.10 with mild elevation in lactate to 2.1 so will continue to trend but appears to have viral etiology    Leukocytosis -Appears to be  secondary to demargination from recent initiation of steroids but given ongoing fever need to exclude bacterial infection as above -Labs in a.m.    Acute hyperglycemia -Likely from steroids -CBG less than 150   Thrombocytopenia -Likely reflective of ongoing poor perfusion in setting of SIRS with significant hypoxemia despite oxygen at home -Rule out gram-negative infectious process so check urinalysis and culture    Autism -Cared for at home by his sister -Patient does better with liquid-based oral medications    DVT Prophylaxis: Lovenox  Family Communication:  sister  Code Status:  Full code  Condition:  Guarded  Discharge disposition: Anticipate  discharge back to previous home environment  Time spent in minutes : 60      Domenica Weightman L. ANP on 03/11/2015 at 9:00 AM  You may contact me by going to [email or web address] - password [email or web address]  I am available from 7a-7p but please confirm I am on the schedule by going to Amion as above.   After 7p please contact night coverage person covering me after hours  [facility]

## 2015-03-11 NOTE — Progress Notes (Addendum)
Pharmacy Antibiotic Note  Juan Cameron is a 40 y.o. male admitted on 03/11/2015 with pneumonia.  Pt has had a recent admission with a full work up at that time. He was hospitalized for 10 days and just discharged yesterday. He completed a full course of Levaquin during previous hospitalization.  He is being readmitted now due to hypoxia. Pt has leukocytosis to 22.9 but could be too prednisone taper PTA. Tm 100.3, SCr 1.  Plan: -Cefepime 2 g IV q8h -Vancomycin 2 g IV x1 then 1g/8h -Monitor renal fx, cultures, obtain VT at Css  Height:  (175.3 cm) Weight: 226 lb (102.513 kg) IBW/kg (Calculated) : 70.7  Temp (24hrs), Avg:99.9 F (37.7 C), Min:98.1 F (36.7 C), Max:102 F (38.9 C)   Recent Labs Lab 03/07/15 0738 03/11/15 0630  WBC  --  22.9*  CREATININE 1.11 1.01    Estimated Creatinine Clearance: 118.1 mL/min (by C-G formula based on Cr of 1.01).    Allergies  Allergen Reactions  . Peanuts [Peanut Oil] Anaphylaxis    Antimicrobials this admission: 2/28 cefepime > 2/28 vancomycin >  Dose adjustments this admission: NA  Microbiology results: 2/28 blood cx: 2/28 urine cx: 2/28 RVP:  Thank you for allowing pharmacy to be a part of this patient's care.  Baldemar Friday 03/11/2015 9:12 AM

## 2015-03-11 NOTE — ED Provider Notes (Signed)
CSN: [id number]     Arrival date & time 03/11/15  0531 History   First MD Initiated Contact with Patient 03/11/15 [phone]     Chief Complaint  Patient presents with  . Shortness of Breath     (Consider location/radiation/quality/duration/timing/severity/associated sxs/prior Treatment) HPI  This is a 40 year old male with history of sickle cell trait, autism who presents with increasing shortness of breath and hypoxia. Patient was discharged from the hospital yesterday after having a prolonged course. He was admitted with multifocal pneumonia. Had persistent hypoxia. He was seen by pulmonary. Thought to have an element of acute lung injury. He was discharged home with home oxygen and prednisone. Per the patient's sister, he has been on 4 L of oxygen. He frequently desats into the low 80s with ambulation. Prior to presentation, he he got up to go the bathroom and desaturated into the 50s. Patient reports shortness of breath. He states that he does not feel good. No fevers at home.  I reviewed the patient's chart. He was discharged on home oxygen. Did have a fever prior to discharge. He was flu negative. He had a CT angiogram of the chest that was negative at admission.  Past Medical History  Diagnosis Date  . Autism   . Sickle cell trait (HCC)   . Pneumonia 02/2015  . Autism    Past Surgical History  Procedure Laterality Date  . No past surgeries     Family History  Problem Relation Age of Onset  . Heart attack Mother    Social History  Substance Use Topics  . Smoking status: Never Smoker   . Smokeless tobacco: Never Used  . Alcohol Use: No    Review of Systems  Constitutional: Negative.  Negative for fever.  Respiratory: Positive for cough and shortness of breath. Negative for chest tightness.   Cardiovascular: Negative.  Negative for chest pain and leg swelling.  Gastrointestinal: Negative.  Negative for abdominal pain.  Genitourinary: Negative.  Negative for dysuria.   Musculoskeletal: Negative for back pain.  All other systems reviewed and are negative.     Allergies  Peanuts  Home Medications   Prior to Admission medications   Medication Sig Start Date End Date Taking? Authorizing Provider  albuterol (PROAIR HFA) 108 (90 Base) MCG/ACT inhaler Inhale 2 puffs into the lungs every 6 (six) hours as needed for wheezing or shortness of breath.  02/27/15  Yes Historical Provider, MD  albuterol (PROVENTIL) (2.5 MG/3ML) 0.083% nebulizer solution Take 2.5 mg by nebulization every 6 (six) hours as needed for wheezing or shortness of breath.   Yes Historical Provider, MD  guaiFENesin (MUCINEX) 600 MG 12 hr tablet Take 600 mg by mouth 2 (two) times daily as needed for cough or to loosen phlegm.   Yes Historical Provider, MD  predniSONE (DELTASONE) 20 MG tablet Take 2 tablets (40 mg total) by mouth daily with breakfast. 40 mg for 3 days then 20 mg for 4 days then 10 mg for 2 days 03/10/15  Yes Costin Otelia Sergeant, MD   BP 138/78 mmHg  Pulse 114  Temp(Src) 99 F (37.2 C) (Oral)  Resp 38  Ht  (1.753 m)  Wt 226 lb (102.513 kg)  BMI 33.36 kg/m2  SpO2 93% Physical Exam  Constitutional: He is oriented to person, place, and time.  Tachypnea but no overt respiratory distress  HENT:  Head: Normocephalic and atraumatic.  Cardiovascular: Normal rate, regular rhythm and normal heart sounds.   No murmur heard. Pulmonary/Chest: Effort  normal. No respiratory distress. He has no wheezes. He has rales.  Rails and coarse breath sounds bilaterally, mild tachypnea  Abdominal: Soft. Bowel sounds are normal. There is no tenderness. There is no rebound.  Musculoskeletal: He exhibits no edema.  Neurological: He is alert and oriented to person, place, and time.  Skin: Skin is warm and dry.  Psychiatric:  Flat affect  Nursing note and vitals reviewed.   ED Course  Procedures (including critical care time)  CRITICAL CARE Performed by: Shon Baton   Total  critical care time: 40 minutes  Critical care time was exclusive of separately billable procedures and treating other patients.  Critical care was necessary to treat or prevent imminent or life-threatening deterioration.  Critical care was time spent personally by me on the following activities: development of treatment plan with patient and/or surrogate as well as nursing, discussions with consultants, evaluation of patient's response to treatment, examination of patient, obtaining history from patient or surrogate, ordering and performing treatments and interventions, ordering and review of laboratory studies, ordering and review of radiographic studies, pulse oximetry and re-evaluation of patient's condition.  Labs Review Labs Reviewed  BASIC METABOLIC PANEL - Abnormal; Notable for the following:    Chloride 99 (*)    Glucose, Bld 123 (*)    Calcium 8.7 (*)    All other components within normal limits  I-STAT ARTERIAL BLOOD GAS, ED - Abnormal; Notable for the following:    pCO2 arterial 48.5 (*)    pO2, Arterial 67.0 (*)    Bicarbonate 29.4 (*)    Acid-Base Excess 3.0 (*)    All other components within normal limits  BRAIN NATRIURETIC PEPTIDE  CBC WITH DIFFERENTIAL/PLATELET    Imaging Review Dg Chest 2 View  03/10/2015  CLINICAL DATA:  Fever.  Chest pain. EXAM: CHEST  2 VIEW COMPARISON:  03/07/2015 chest radiograph. FINDINGS: Low lung volumes. Stable cardiomediastinal silhouette with normal heart size. No pneumothorax. No definite pleural effusions. Patchy bibasilar lung consolidation, not appreciably changed. No overt pulmonary edema. IMPRESSION: Low lung volumes with stable patchy bibasilar lung consolidation, suspicious for a combination of atelectasis and pneumonia. Electronically Signed   By: Delbert Phenix M.D.   On: 03/10/2015 13:07   Dg Chest Portable 1 View  03/11/2015  CLINICAL DATA:  Acute onset of shortness of breath. Decreased O2 saturation. Initial encounter. EXAM:  PORTABLE CHEST 1 VIEW COMPARISON:  Chest radiograph performed 03/10/2015 FINDINGS: The lungs are hypoexpanded. Bibasilar airspace opacification may reflect pneumonia or possibly pulmonary edema. A small left pleural effusion is suspected. No pneumothorax is seen. The cardiomediastinal silhouette is borderline normal in size. No acute osseous abnormalities are identified. IMPRESSION: Lungs hypoexpanded. Bibasilar airspace opacities may reflect pneumonia or possibly pulmonary edema, similar in appearance to the prior study. Small left pleural effusion suspected. Electronically Signed   By: Roanna Raider M.D.   On: 03/11/2015 05:54   I have personally reviewed and evaluated these images and lab results as part of my medical decision-making.   EKG Interpretation None      MDM   Final diagnoses:  Hypoxia  History of pneumonia    Patient presents with persistent shortness of breath and hypoxia. Reported O2 sats in the 50s at home. Currently requiring a nonrebreather to maintain oxygen saturations greater than 90%. He is tachypnea but is not in any overt respiratory distress. ABG shows evidence of hypoxia and mild hypercarbia. Chest x-ray is largely unchanged. Given his oxygen requirement and ABG, patient was placed  on BiPAP to provide him with some PEEP.  Labs pending. Anticipate discussion for admission with the hospitalist. Consider pulmonology consult.  Shon Baton, MD 03/11/15 1630

## 2015-03-11 NOTE — Progress Notes (Signed)
Utilization review completed.  L. J. Cambren Helm RN, BSN, CM 

## 2015-03-12 ENCOUNTER — Inpatient Hospital Stay (HOSPITAL_COMMUNITY): Payer: Medicare Other

## 2015-03-12 DIAGNOSIS — J9601 Acute respiratory failure with hypoxia: Secondary | ICD-10-CM

## 2015-03-12 DIAGNOSIS — J80 Acute respiratory distress syndrome: Secondary | ICD-10-CM | POA: Diagnosis present

## 2015-03-12 DIAGNOSIS — J189 Pneumonia, unspecified organism: Principal | ICD-10-CM

## 2015-03-12 DIAGNOSIS — I519 Heart disease, unspecified: Secondary | ICD-10-CM

## 2015-03-12 LAB — BASIC METABOLIC PANEL
ANION GAP: 14 (ref 5–15)
BUN: 19 mg/dL (ref 6–20)
CHLORIDE: 98 mmol/L — AB (ref 101–111)
CO2: 27 mmol/L (ref 22–32)
CREATININE: 1.27 mg/dL — AB (ref 0.61–1.24)
Calcium: 9 mg/dL (ref 8.9–10.3)
GFR calc non Af Amer: >60 mL/min (ref >60)
Glucose, Bld: 130 mg/dL — ABNORMAL HIGH (ref 65–99)
Potassium: 4.5 mmol/L (ref 3.5–5.1)
Sodium: 139 mmol/L (ref 135–145)

## 2015-03-12 LAB — RESPIRATORY VIRUS PANEL
ADENOVIRUS: NEGATIVE
Influenza A: NEGATIVE
Influenza B: NEGATIVE
METAPNEUMOVIRUS: NEGATIVE
Parainfluenza 1: NEGATIVE
Parainfluenza 2: NEGATIVE
Parainfluenza 3: NEGATIVE
RESPIRATORY SYNCYTIAL VIRUS A: NEGATIVE
Respiratory Syncytial Virus B: NEGATIVE
Rhinovirus: NEGATIVE

## 2015-03-12 LAB — POCT I-STAT 3, ART BLOOD GAS (G3+)
ACID-BASE EXCESS: 4 mmol/L — AB (ref 0.0–2.0)
BICARBONATE: 30.2 meq/L — AB (ref 20.0–24.0)
O2 Saturation: 89 %
TCO2: 32 mmol/L (ref 0–100)
pCO2 arterial: 48 mmHg — ABNORMAL HIGH (ref 35.0–45.0)
pH, Arterial: 7.406 (ref 7.350–7.450)
pO2, Arterial: 56 mmHg — ABNORMAL LOW (ref 80.0–100.0)

## 2015-03-12 LAB — CBC
HCT: 39.6 % (ref 39.0–52.0)
Hemoglobin: 13.1 g/dL (ref 13.0–17.0)
MCH: 26.5 pg (ref 26.0–34.0)
MCHC: 33.1 g/dL (ref 30.0–36.0)
MCV: 80 fL (ref 78.0–100.0)
PLATELETS: 152 10*3/uL (ref 150–400)
RBC: 4.95 MIL/uL (ref 4.22–5.81)
RDW: 15.4 % (ref 11.5–15.5)
WBC: 27.6 10*3/uL — AB (ref 4.0–10.5)

## 2015-03-12 LAB — COMPREHENSIVE METABOLIC PANEL
ALK PHOS: 44 U/L (ref 38–126)
ALT: 25 U/L (ref 17–63)
AST: 51 U/L — AB (ref 15–41)
Albumin: 2.9 g/dL — ABNORMAL LOW (ref 3.5–5.0)
Anion gap: 12 (ref 5–15)
BILIRUBIN TOTAL: 0.6 mg/dL (ref 0.3–1.2)
BUN: 16 mg/dL (ref 6–20)
CALCIUM: 8.8 mg/dL — AB (ref 8.9–10.3)
CHLORIDE: 100 mmol/L — AB (ref 101–111)
CO2: 26 mmol/L (ref 22–32)
CREATININE: 1.23 mg/dL (ref 0.61–1.24)
GFR calc Af Amer: >60 mL/min (ref >60)
Glucose, Bld: 120 mg/dL — ABNORMAL HIGH (ref 65–99)
Potassium: 5.3 mmol/L — ABNORMAL HIGH (ref 3.5–5.1)
Sodium: 138 mmol/L (ref 135–145)
TOTAL PROTEIN: 6.4 g/dL — AB (ref 6.5–8.1)

## 2015-03-12 LAB — LEGIONELLA ANTIGEN, URINE

## 2015-03-12 LAB — BLOOD GAS, ARTERIAL
Acid-Base Excess: 3.7 mmol/L — ABNORMAL HIGH (ref 0.0–2.0)
Bicarbonate: 28.1 mEq/L — ABNORMAL HIGH (ref 20.0–24.0)
Delivery systems: POSITIVE
Drawn by: 129711
EXPIRATORY PAP: 8
FIO2: 0.8
INSPIRATORY PAP: 10
O2 SAT: 87.1 %
PO2 ART: 58.1 mmHg — AB (ref 80.0–100.0)
Patient temperature: 99.1
TCO2: 29.5 mmol/L (ref 0–100)
pCO2 arterial: 46.2 mmHg — ABNORMAL HIGH (ref 35.0–45.0)
pH, Arterial: 7.402 (ref 7.350–7.450)

## 2015-03-12 LAB — GLUCOSE, CAPILLARY: Glucose-Capillary: 126 mg/dL — ABNORMAL HIGH (ref 65–99)

## 2015-03-12 LAB — VANCOMYCIN, TROUGH: VANCOMYCIN TR: 27 ug/mL — AB (ref 10.0–20.0)

## 2015-03-12 MED ORDER — FENTANYL BOLUS VIA INFUSION
50.0000 ug | INTRAVENOUS | Status: DC | PRN
  Filled 2015-03-12: qty 50

## 2015-03-12 MED ORDER — ANTISEPTIC ORAL RINSE SOLUTION (CORINZ)
7.0000 mL | Freq: Four times a day (QID) | OROMUCOSAL | Status: DC
  Administered 2015-03-13 – 2015-04-07 (×103): 7 mL via OROMUCOSAL

## 2015-03-12 MED ORDER — ROCURONIUM BROMIDE 50 MG/5ML IV SOLN
50.0000 mg | Freq: Once | INTRAVENOUS | Status: AC
  Administered 2015-03-12: 50 mg via INTRAVENOUS

## 2015-03-12 MED ORDER — CHLORHEXIDINE GLUCONATE 0.12% ORAL RINSE (MEDLINE KIT)
15.0000 mL | Freq: Two times a day (BID) | OROMUCOSAL | Status: DC
  Administered 2015-03-12 – 2015-04-07 (×52): 15 mL via OROMUCOSAL

## 2015-03-12 MED ORDER — ARTIFICIAL TEARS OP OINT
1.0000 "application " | TOPICAL_OINTMENT | Freq: Three times a day (TID) | OPHTHALMIC | Status: DC
  Administered 2015-03-12 – 2015-03-13 (×4): 1 via OPHTHALMIC
  Filled 2015-03-12 (×2): qty 3.5

## 2015-03-12 MED ORDER — SODIUM CHLORIDE 0.9 % IV BOLUS (SEPSIS)
500.0000 mL | Freq: Once | INTRAVENOUS | Status: AC
  Administered 2015-03-12: 500 mL via INTRAVENOUS

## 2015-03-12 MED ORDER — FENTANYL BOLUS VIA INFUSION
50.0000 ug | INTRAVENOUS | Status: DC | PRN
  Administered 2015-03-16 – 2015-03-21 (×2): 50 ug via INTRAVENOUS
  Filled 2015-03-12: qty 50

## 2015-03-12 MED ORDER — SODIUM CHLORIDE 0.9 % IV SOLN
25.0000 ug/h | INTRAVENOUS | Status: DC
  Administered 2015-03-12: 200 ug/h via INTRAVENOUS
  Filled 2015-03-12: qty 50

## 2015-03-12 MED ORDER — FENTANYL CITRATE (PF) 100 MCG/2ML IJ SOLN
INTRAMUSCULAR | Status: AC
  Administered 2015-03-12: 100 ug
  Filled 2015-03-12: qty 2

## 2015-03-12 MED ORDER — MIDAZOLAM HCL 2 MG/2ML IJ SOLN
2.0000 mg | Freq: Once | INTRAMUSCULAR | Status: AC
  Administered 2015-03-12: 2 mg via INTRAVENOUS

## 2015-03-12 MED ORDER — FUROSEMIDE 10 MG/ML IJ SOLN
40.0000 mg | Freq: Two times a day (BID) | INTRAMUSCULAR | Status: DC
  Administered 2015-03-12: 40 mg via INTRAVENOUS

## 2015-03-12 MED ORDER — PROPOFOL 1000 MG/100ML IV EMUL
5.0000 ug/kg/min | INTRAVENOUS | Status: DC
  Administered 2015-03-12: 40 ug/kg/min via INTRAVENOUS
  Filled 2015-03-12: qty 100

## 2015-03-12 MED ORDER — MIDAZOLAM HCL 2 MG/2ML IJ SOLN: 2.0000 mg | INTRAMUSCULAR | Status: DC | PRN

## 2015-03-12 MED ORDER — ETOMIDATE 2 MG/ML IV SOLN
20.0000 mg | Freq: Once | INTRAVENOUS | Status: AC
  Administered 2015-03-12: 20 mg via INTRAVENOUS

## 2015-03-12 MED ORDER — MIDAZOLAM HCL 2 MG/2ML IJ SOLN
2.0000 mg | INTRAMUSCULAR | Status: DC | PRN
  Filled 2015-03-12: qty 2

## 2015-03-12 MED ORDER — MIDAZOLAM HCL 2 MG/2ML IJ SOLN
2.0000 mg | Freq: Once | INTRAMUSCULAR | Status: AC
  Administered 2015-03-12: 2 mg via INTRAVENOUS
  Filled 2015-03-12: qty 2

## 2015-03-12 MED ORDER — VANCOMYCIN HCL IN DEXTROSE 750-5 MG/150ML-% IV SOLN
750.0000 mg | Freq: Two times a day (BID) | INTRAVENOUS | Status: AC
  Administered 2015-03-13 – 2015-03-15 (×6): 750 mg via INTRAVENOUS
  Filled 2015-03-12 (×6): qty 150

## 2015-03-12 MED ORDER — SODIUM CHLORIDE 0.9 % IV SOLN
3.0000 ug/kg/min | INTRAVENOUS | Status: DC
  Administered 2015-03-12: 3 ug/kg/min via INTRAVENOUS
  Administered 2015-03-13: 2.2 ug/kg/min via INTRAVENOUS
  Administered 2015-03-14: 1.9 ug/kg/min via INTRAVENOUS
  Filled 2015-03-12 (×4): qty 20

## 2015-03-12 MED ORDER — MIDAZOLAM HCL 2 MG/2ML IJ SOLN
INTRAMUSCULAR | Status: AC
  Filled 2015-03-12: qty 2

## 2015-03-12 MED ORDER — PANTOPRAZOLE SODIUM 40 MG PO PACK
40.0000 mg | PACK | Freq: Every day | ORAL | Status: DC
  Administered 2015-03-12 – 2015-04-07 (×26): 40 mg
  Filled 2015-03-12 (×27): qty 20

## 2015-03-12 MED ORDER — FUROSEMIDE 10 MG/ML IJ SOLN
INTRAMUSCULAR | Status: AC
  Filled 2015-03-12: qty 4

## 2015-03-12 MED ORDER — SODIUM CHLORIDE 0.9 % IV SOLN
25.0000 ug/h | INTRAVENOUS | Status: DC
  Administered 2015-03-12: 300 ug/h via INTRAVENOUS
  Administered 2015-03-13: 250 ug/h via INTRAVENOUS
  Administered 2015-03-13 (×2): 300 ug/h via INTRAVENOUS
  Administered 2015-03-14: 250 ug/h via INTRAVENOUS
  Administered 2015-03-15: 300 ug/h via INTRAVENOUS
  Administered 2015-03-15: 250 ug/h via INTRAVENOUS
  Administered 2015-03-15: 150 ug/h via INTRAVENOUS
  Administered 2015-03-16 (×2): 300 ug/h via INTRAVENOUS
  Administered 2015-03-17: 200 ug/h via INTRAVENOUS
  Administered 2015-03-17: 150 ug/h via INTRAVENOUS
  Administered 2015-03-18: 100 ug/h via INTRAVENOUS
  Administered 2015-03-18 (×2): 300 ug/h via INTRAVENOUS
  Administered 2015-03-19: 100 ug/h via INTRAVENOUS
  Administered 2015-03-19: 300 ug/h via INTRAVENOUS
  Administered 2015-03-21: 75 ug/h via INTRAVENOUS
  Administered 2015-03-21: 300 ug/h via INTRAVENOUS
  Filled 2015-03-12 (×20): qty 50

## 2015-03-12 MED ORDER — FENTANYL CITRATE (PF) 100 MCG/2ML IJ SOLN: 50.0000 ug | Freq: Once | INTRAMUSCULAR | Status: DC

## 2015-03-12 MED ORDER — FENTANYL CITRATE (PF) 100 MCG/2ML IJ SOLN: 100.0000 ug | Freq: Once | INTRAMUSCULAR | Status: DC | PRN

## 2015-03-12 MED ORDER — FENTANYL CITRATE (PF) 100 MCG/2ML IJ SOLN
100.0000 ug | Freq: Once | INTRAMUSCULAR | Status: AC
  Administered 2015-03-12: 100 ug via INTRAVENOUS
  Filled 2015-03-12: qty 2

## 2015-03-12 MED ORDER — DEXTROSE 5 % IV SOLN
2.0000 ug/min | INTRAVENOUS | Status: DC
  Administered 2015-03-12: 5 ug/min via INTRAVENOUS
  Administered 2015-03-13 (×2): 2 ug/min via INTRAVENOUS
  Administered 2015-03-17: 20 ug/min via INTRAVENOUS
  Administered 2015-03-18: 6 ug/min via INTRAVENOUS
  Administered 2015-03-18: 5 ug/min via INTRAVENOUS
  Administered 2015-03-19: 4 ug/min via INTRAVENOUS
  Administered 2015-03-20 (×2): 6 ug/min via INTRAVENOUS
  Administered 2015-03-21: 10 ug/min via INTRAVENOUS
  Administered 2015-03-21: 15 ug/min via INTRAVENOUS
  Filled 2015-03-12 (×10): qty 4

## 2015-03-12 MED ORDER — FENTANYL CITRATE (PF) 100 MCG/2ML IJ SOLN: 100.0000 ug | Freq: Once | INTRAMUSCULAR | Status: DC

## 2015-03-12 MED ORDER — PROPOFOL 1000 MG/100ML IV EMUL
5.0000 ug/kg/min | INTRAVENOUS | Status: DC
  Administered 2015-03-12: 30 ug/kg/min via INTRAVENOUS
  Administered 2015-03-12: 50 ug/kg/min via INTRAVENOUS
  Administered 2015-03-13 (×3): 55 ug/kg/min via INTRAVENOUS
  Administered 2015-03-13: 50 ug/kg/min via INTRAVENOUS
  Administered 2015-03-13: 55 ug/kg/min via INTRAVENOUS
  Filled 2015-03-12 (×7): qty 100

## 2015-03-12 MED ORDER — CISATRACURIUM BOLUS VIA INFUSION
2.5000 mg | Freq: Once | INTRAVENOUS | Status: AC
Start: 2015-03-12 — End: 2015-03-12
  Administered 2015-03-12: 2.5 mg via INTRAVENOUS
  Filled 2015-03-12: qty 3

## 2015-03-12 NOTE — Procedures (Signed)
Pt removed from Bipap and placed on NRB by RN because pt could not tolerate.  PT given scheduled treatment and treatment stopped because pt's SpO2 decrease to 745.  Pt placed back on NRB.

## 2015-03-12 NOTE — Procedures (Signed)
Intubation Procedure Note Juan Cameron [id number] Feb 01, 1975  Procedure: Intubation Indications: Respiratory insufficiency  Procedure Details Consent: Risks of procedure as well as the alternatives and risks of each were explained to the (patient/caregiver).  Consent for procedure obtained. Time Out: Verified patient identification, verified procedure, site/side was marked, verified correct patient position, special equipment/implants available, medications/allergies/relevent history reviewed, required imaging and test results available.  Performed  Maximum sterile technique was used including antiseptics, gloves, hand hygiene and mask.  MAC and 4  Versed 2 mg fent 200 mcg In divided doses Etomidate 20 Rocuronium 50    Evaluation Hemodynamic Status: Transient hypotension treated with fluid; O2 sats: stable throughout Patient's Current Condition: stable Complications: No apparent complications Patient did tolerate procedure well. Chest X-ray ordered to verify placement.  CXR: pending.  Procedure performed by Minor NP , supervised by me  [facility] V. 03/12/2015

## 2015-03-12 NOTE — Progress Notes (Signed)
eLink Physician-Brief Progress Note Patient Name: Juan Cameron DOB: Apr 20, 1975 MRN: [id number]   Date of Service  03/12/2015  HPI/Events of Note  Pt on fentanyl 300/h, Propofol 50 + paralytics. His train of 4 = 2, no evidence for vent dysynchrony. His BIS is consistently in 50's. I am comfortable continuing his sedation at current levels, would not increase based solely on BIS data.   eICU Interventions       Intervention Category Minor Interventions: Agitation / anxiety - evaluation and management  Darielle Hancher,Gracyn S. 03/12/2015, 9:37 PM

## 2015-03-12 NOTE — Progress Notes (Signed)
Transported Pt on BiPap to 2M15. Tolerated well. Report given to RT.

## 2015-03-12 NOTE — Progress Notes (Signed)
[facility] PROGRESS NOTE    Progress Note   Juan Cameron ZOX:[id number] DOB: Jan 24, 1975 DOA: 03/11/2015 PCP: Ananias Pilgrim, MD   Brief Narrative:   Juan Cameron is an 40 y.o. male past medical history of autism who presents with acute respiratory failure, he was recently discharged from the hospital on 03/10/2015, at this time he had possible acute lung injury. He was discharged home on steroids. Assessment/Plan:   SIRS/Acute respiratory failure likely due to HCAP (healthcare-associated pneumonia)/acute lung injury versus acute decompensated diastolic heart failure: He had an echo on 03/07/2015 that showed an EF of 60% with grade 2 diastolic heart failure, on admission his BNP was 20. Check an ABG increase IV Lasix. His creatinine has remained stable.  He is breathing about 35-40 times per minute we'll check an ABG. On BiPAP. his leukocytosis is worsening from 8 of 27 with a mild temperature, he is currently on Vanco and cefepime.  cultures are pending. Consult pulmonary. I agree with IV empiric antibiotics, continue on IV Solu-Medrol.  Autism  Acute hyperglycemia: Due to stressed emargination and steroids. Continue sliding scale insulin.       DVT Prophylaxis - Lovenox ordered.  Family Communication: sister Disposition Plan:  Unable to determine Code Status:     Code Status Orders        Start     Ordered   03/11/15 1545  Full code   Continuous     03/11/15 1544    Code Status History    Date Active Date Inactive Code Status Order ID Comments User Context   02/28/2015  6:13 PM 03/10/2015  8:48 PM Full Code [id number]  Marinda Elk, MD Inpatient        IV Access:    Peripheral IV   Procedures and diagnostic studies:   Dg Chest 2 View  03/10/2015  CLINICAL DATA:  Fever.  Chest pain. EXAM: CHEST  2 VIEW COMPARISON:  03/07/2015 chest radiograph. FINDINGS: Low lung volumes. Stable cardiomediastinal silhouette with normal heart size. No  pneumothorax. No definite pleural effusions. Patchy bibasilar lung consolidation, not appreciably changed. No overt pulmonary edema. IMPRESSION: Low lung volumes with stable patchy bibasilar lung consolidation, suspicious for a combination of atelectasis and pneumonia. Electronically Signed   By: Delbert Phenix M.D.   On: 03/10/2015 13:07   Dg Chest Portable 1 View  03/11/2015  CLINICAL DATA:  Acute onset of shortness of breath. Decreased O2 saturation. Initial encounter. EXAM: PORTABLE CHEST 1 VIEW COMPARISON:  Chest radiograph performed 03/10/2015 FINDINGS: The lungs are hypoexpanded. Bibasilar airspace opacification may reflect pneumonia or possibly pulmonary edema. A small left pleural effusion is suspected. No pneumothorax is seen. The cardiomediastinal silhouette is borderline normal in size. No acute osseous abnormalities are identified. IMPRESSION: Lungs hypoexpanded. Bibasilar airspace opacities may reflect pneumonia or possibly pulmonary edema, similar in appearance to the prior study. Small left pleural effusion suspected. Electronically Signed   By: Roanna Raider M.D.   On: 03/11/2015 05:54     Medical Consultants:    None.  Anti-Infectives:   Anti-infectives    Start     Dose/Rate Route Frequency Ordered Stop   03/11/15 1800  vancomycin (VANCOCIN) IVPB 1000 mg/200 mL premix     1,000 mg 200 mL/hr over 60 Minutes Intravenous Every 8 hours 03/11/15 0911     03/11/15 0930  vancomycin (VANCOCIN) 2,000 mg in sodium chloride 0.9 % 500 mL IVPB     2,000 mg 250 mL/hr over 120 Minutes Intravenous  Once 03/11/15 0908  03/11/15 1138   03/11/15 0915  ceFEPIme (MAXIPIME) 1 g in dextrose 5 % 50 mL IVPB  Status:  Discontinued     1 g 100 mL/hr over 30 Minutes Intravenous 3 times per day 03/11/15 0843 03/11/15 0907   03/11/15 0915  ceFEPIme (MAXIPIME) 2 g in dextrose 5 % 50 mL IVPB     2 g 100 mL/hr over 30 Minutes Intravenous 3 times per day 03/11/15 9604 03/19/15 0559      Subjective:     Juan Cameron nonverbal, and history over 35 times per minute.  Objective:    Filed Vitals:   03/12/15 0009 03/12/15 0356 03/12/15 0755 03/12/15 0758  BP:    121/68  Pulse: 105 105  113  Temp:  98 F (36.7 C)  98.5 F (36.9 C)  TempSrc:  Oral  Axillary  Resp: Height:      Weight:      SpO2: 94% 90% 91% 91%    Intake/Output Summary (Last 24 hours) at 03/12/15 0848 Last data filed at 03/12/15 0133  Gross per 24 hour  Intake    480 ml  Output   2650 ml  Net  -2170 ml   Filed Weights   03/11/15 0536 03/11/15 1525  Weight: 102.513 kg (226 lb) 101.6 kg (223 lb 15.8 oz)    Exam: Gen:  NAD Cardiovascular:  Moderate air movement with diffuse crackles. Chest and lungs:   CTAB Abdomen:  Abdomen soft, NT/ND, + BS Extremities:  No edema   Data Reviewed:    Labs: Basic Metabolic Panel:  Recent Labs Lab 03/07/15 0738 03/11/15 0630 03/11/15 1702 03/12/15 0434  NA 137 140  --  138  K 3.9 3.8  --  5.3*  CL 98* 99*  --  100*  CO2 31 30  --  26  GLUCOSE 101* 123*  --  120*  BUN 18 15  --  16  CREATININE 1.11 1.01  --  1.23  CALCIUM 8.5* 8.7*  --  8.8*  MG  --   --  2.2  --   PHOS  --   --  3.9  --    GFR Estimated Creatinine Clearance: 96.6 mL/min (by C-G formula based on Cr of 1.23). Liver Function Tests:  Recent Labs Lab 03/12/15 0434  AST 51*  ALT 25  ALKPHOS 44  BILITOT 0.6  PROT 6.4*  ALBUMIN 2.9*   No results for input(s): LIPASE, AMYLASE in the last 168 hours. No results for input(s): AMMONIA in the last 168 hours. Coagulation profile No results for input(s): INR, PROTIME in the last 168 hours.  CBC:  Recent Labs Lab 03/11/15 0630 03/12/15 0434  WBC 22.9* 27.6*  NEUTROABS 17.6*  --   HGB 13.8 13.1  HCT 40.9 39.6  MCV 80.7 80.0  PLT 147* 152   Cardiac Enzymes: No results for input(s): CKTOTAL, CKMB, CKMBINDEX, TROPONINI in the last 168 hours. BNP (last 3 results) No results for input(s): PROBNP in the last 8760  hours. CBG: No results for input(s): GLUCAP in the last 168 hours. D-Dimer: No results for input(s): DDIMER in the last 72 hours. Hgb A1c: No results for input(s): HGBA1C in the last 72 hours. Lipid Profile: No results for input(s): CHOL, HDL, LDLCALC, TRIG, CHOLHDL, LDLDIRECT in the last 72 hours. Thyroid function studies: No results for input(s): TSH, T4TOTAL, T3FREE, THYROIDAB in the last 72 hours.  Invalid input(s): FREET3 Anemia work up: No results for input(s): VITAMINB12,  FOLATE, FERRITIN, TIBC, IRON, RETICCTPCT in the last 72 hours. Sepsis Labs:  Recent Labs Lab 03/07/15 1303 03/09/15 0420 03/11/15 0630 03/11/15 0925 03/11/15 0927 03/11/15 1222 03/12/15 0434  PROCALCITON 0.18 0.12  --  <0.10  --   --   --   WBC  --   --  22.9*  --   --   --  27.6*  LATICACIDVEN  --   --   --   --  2.1* 1.6  --    Microbiology Recent Results (from the past 240 hour(s))  Culture, blood (routine x 2) Call MD if unable to obtain prior to antibiotics being given     Status: None (Preliminary result)   Collection Time: 03/11/15  9:00 AM  Result Value Ref Range Status   Specimen Description BLOOD LEFT ANTECUBITAL  Final   Special Requests   Final    BOTTLES DRAWN AEROBIC AND ANAEROBIC 10CC AER 5CC ANA   Culture PENDING  Incomplete   Report Status PENDING  Incomplete  MRSA PCR Screening     Status: None   Collection Time: 03/11/15  3:31 PM  Result Value Ref Range Status   MRSA by PCR NEGATIVE NEGATIVE Final    Comment:        The GeneXpert MRSA Assay (FDA approved for NASAL specimens only), is one component of a comprehensive MRSA colonization surveillance program. It is not intended to diagnose MRSA infection nor to guide or monitor treatment for MRSA infections.      Medications:   . ceFEPime (MAXIPIME) IV  2 g Intravenous 3 times per day  . enoxaparin (LOVENOX) injection  40 mg Subcutaneous Q24H  . furosemide  20 mg Intravenous Q12H  . ipratropium-albuterol  3 mL  Nebulization Q4H  . methylPREDNISolone (SOLU-MEDROL) injection  60 mg Intravenous Q12H  . potassium chloride  20 mEq Oral BID  . sodium chloride flush  3 mL Intravenous Q12H  . vancomycin  1,000 mg Intravenous Q8H   Continuous Infusions:   Time spent: 35 min   LOS: 1 day   Marinda Elk  [facility] Pager [phone]  *Please refer to [email or web address], password [email or web address] to get updated schedule on who will round on this patient, as hospitalists switch teams weekly. If 7PM-7AM, please contact night-coverage at [email or web address], password [email or web address] for any overnight needs.  03/12/2015, 8:48 AM

## 2015-03-12 NOTE — Progress Notes (Signed)
S:  Called to bedside per RT to assess for elevated peak pressures on vent.   O:  Blood pressure 100/59, pulse 105, temperature 98.3 F (36.8 C), temperature source Oral, resp. rate 20, height  (1.753 m), weight 223 lb 15.8 oz (101.6 kg), SpO2 90 %.  General: young adult male in NAD on vent  Neuro: sedated  CV: s1s2 distant rrr, SR PULM: non-labored on vent, synchronous with PRVC, peak pressures in 50's, diminished on left.  Patient suctioned, able to pass catheter without difficulty, scant blood streaked secretions returned Extremities: warm/dry, no edema  ABG    Component Value Date/Time   PHART 7.406 03/12/2015 1314   PCO2ART 48.0* 03/12/2015 1314   PO2ART 56.0* 03/12/2015 1314   HCO3 30.2* 03/12/2015 1314   TCO2 32 03/12/2015 1314   O2SAT 89.0 03/12/2015 1314    A:  Acute Hypoxic Respiratory Failure  Elevated Peak Airway Pressure  P:  Reduce Vt to 7cc/kg PEEP to 14 after last ABG review, 100% fiO2 Assess STAT CXR to ensure no PTX  RR increased to 28 Of note, attempted PCV with no significant change in peak pressures.  Increase sedation, may require paralytics  Additional CC Time:  20 minutes    Canary Brim, NP-C [facility] Pgr: [phone] or if no answer [phone] 03/12/2015, 2:13 PM

## 2015-03-12 NOTE — Consult Note (Signed)
PULMONARY / CRITICAL CARE MEDICINE   Name: Juan Cameron MRN: [id number] DOB: 09/18/1975    ADMISSION DATE:  03/11/2015 CONSULTATION DATE:  2/28  REFERRING MD:  [facility]  CHIEF COMPLAINT:  resp failure  HISTORY OF PRESENT ILLNESS:   17 autistic male recently in hospital for multiloba pan and dc'd on 6 l [place] and returned in 24 hours with sats in 50%. He was discharged on 03/10/15 after treatment for CAP, NOI, and was on 6 l [place]. Sister [facility]) checked sats and he was 50% and therefore  was transported back to [facility] and required 80% fio2 on NIVMS with RR 45.  Cxr consistent early ARDS, 2d with nl EF but grade 2 dysfunction. PCCM called to bedside and we moved him to ICU with intubation a high probability.Note he is autistic with limited vocabulary.   PAST MEDICAL HISTORY :  He  has a past medical history of Autism; Sickle cell trait (HCC); Pneumonia (02/2015); and Autism.  PAST SURGICAL HISTORY: He  has past surgical history that includes No past surgeries.  Allergies  Allergen Reactions  . Peanuts [Peanut Oil] Anaphylaxis    No current facility-administered medications on file prior to encounter.   Current Outpatient Prescriptions on File Prior to Encounter  Medication Sig  . albuterol (PROAIR HFA) 108 (90 Base) MCG/ACT inhaler Inhale 2 puffs into the lungs every 6 (six) hours as needed for wheezing or shortness of breath.   . guaiFENesin (MUCINEX) 600 MG 12 hr tablet Take 600 mg by mouth 2 (two) times daily as needed for cough or to loosen phlegm.  . predniSONE (DELTASONE) 20 MG tablet Take 2 tablets (40 mg total) by mouth daily with breakfast. 40 mg for 3 days then 20 mg for 4 days then 10 mg for 2 days    FAMILY HISTORY:  His has no family status information on file.   SOCIAL HISTORY: He  reports that he has never smoked. He has never used smokeless tobacco. He reports that he does not drink alcohol or use illicit drugs.  REVIEW OF SYSTEMS:   NA  SUBJECTIVE:  Increased  wob  VITAL SIGNS: BP 121/68 mmHg  Pulse 111  Temp(Src) 98.5 F (36.9 C) (Axillary)  Resp 55  Ht  (1.753 m)  Wt 223 lb 15.8 oz (101.6 kg)  BMI 33.06 kg/m2  SpO2 97%  HEMODYNAMICS:    VENTILATOR SETTINGS: Vent Mode:  [-]  FiO2 (%):  [80 %-100 %] 100 %  INTAKE / OUTPUT: I/O last 3 completed shifts: In: 1480 [P.O.:480; I.V.:1000] Out: 3350 [Urine:3350]  PHYSICAL EXAMINATION: General:  WNWDAAM on NIMVS Neuro:  Awake but tired, follows simple commands. He is mentally impaired HEENT:  PERL 4 mm, no JVD, protruding teeth(difficult airway) Cardiovascular: HSr RRR Lungs: Decreased air movement despite nimvs. .8 fio2 with sats 99%. RR 45 Abdomen:  Obese + bs Musculoskeletal:  intact Skin:  Cool dry  LABS:  BMET  Recent Labs Lab 03/07/15 0738 03/11/15 0630 03/12/15 0434  NA 137 140 138  K 3.9 3.8 5.3*  CL 98* 99* 100*  CO2 BUN CREATININE 1.11 1.01 1.23  GLUCOSE 101* 123* 120*    Electrolytes  Recent Labs Lab 03/07/15 0738 03/11/15 0630 03/11/15 1702 03/12/15 0434  CALCIUM 8.5* 8.7*  --  8.8*  MG  --   --  2.2  --   PHOS  --   --  3.9  --     CBC  Recent Labs Lab 03/11/15 0630 03/12/15 0434  WBC 22.9* 27.6*  HGB 13.8 13.1  HCT 40.9 39.6  PLT 147* 152    Coag's No results for input(s): APTT, INR in the last 168 hours.  Sepsis Markers  Recent Labs Lab 03/07/15 1303 03/09/15 0420 03/11/15 0925 03/11/15 0927 03/11/15 1222  LATICACIDVEN  --   --   --  2.1* 1.6  PROCALCITON 0.18 0.12 <0.10  --   --     ABG  Recent Labs Lab 03/11/15 0642  PHART 7.391  PCO2ART 48.5*  PO2ART 67.0*    Liver Enzymes  Recent Labs Lab 03/12/15 0434  AST 51*  ALT 25  ALKPHOS 44  BILITOT 0.6  ALBUMIN 2.9*    Cardiac Enzymes No results for input(s): TROPONINI, PROBNP in the last 168 hours.  Glucose No results for input(s): GLUCAP in the last 168 hours.  Imaging No results found.   STUDIES:  2/24 2 d with nl ef ,  grade 2 dysfunction  CULTURES: 2/28 bc x 2>> 2/28 uc>> 2/28 RVP>>  ANTIBIOTICS: 2/28 vanc>> 2/28 cefepime>>  SIGNIFICANT EVENTS: 2/28 readmit to hospital   LINES/TUBES:   DISCUSSION: 44 autistic male recently in hospital for multiloba pan and dc'd on 6 l [place] and returned in 24 hours with sats in 50%.  ASSESSMENT / PLAN:  PULMONARY A: Acute hypoxic resp failure. Suspected recurrent pna Early ARDS Suspected undiagnosed OSA  P:   NIMVS for now Broad spectrum abx Suspect he will need intubation ? cvl for fluid management  CARDIOVASCULAR A:  No acute issue but suspect he will get worse before better. Possible fluid overload P:  Transfer to ICU  May need CVL if worsens  Careful with diuresis   RENAL A:   Hyperkalemia P:   DC K+ supplements Follow lytes   GASTROINTESTINAL A:   Gi Protection P:   PPI  HEMATOLOGIC A:   Hx of sickle cell trait DVT protection P:  DVT protection   INFECTIOUS A:   Recurrent Pna with suspected ARDS P:   See ID section  ENDOCRINE A:   Mild hyperglycemia on steroids P:   SSI  NEUROLOGIC A:   Mentally impaired with limited vocabulary of 500 words.  Autism  Decreased LOC from metabolic disarray. P:   RASS goal: 0 Careful with any sedation If intubated will need sedation protocol   FAMILY  - Updates: Caregiver given update at beside  - Inter-disciplinary family meet or Palliative Care meeting due by:  day 7   Steve Jaleya Pebley ACNP [facility] PCCM Pager [phone] till 3 pm If no answer page [phone] 03/12/2015, 9:46 AM

## 2015-03-12 NOTE — Progress Notes (Signed)
He was intubated for severe hypoxia & WOB CXR - reviewed for good position of ETT & CVL , no worsening of infx Sedated with fent gtt, rocuronium given for procedures, peak pr 38 , plat 25 Added PEEP 10 & will use ARDS protocol - start at 8cc/kg 7 lower Tv based on ABG, maintaining pH 7.20 & above bronchoscopy performed - surprisingly hardly any secretions - BAL sent for RVP & cx Sister updated in detail  Adn cc time x 30 mins  Oretha Milch. MD

## 2015-03-12 NOTE — Care Management Note (Signed)
Case Management Note  Patient Details  Name: Laker Thompson MRN: [id number] Date of Birth: 13-Sep-1975  Subjective/Objective:  Pt admitted from home with Respiratory Distress. Pt has family support of sister.                   Action/Plan: Pt is recent admission that was d/c home with 02 via [facility] on 4L via [place]. Pt was not active with [facility]. Pt may benefit from [facility] once stable for d/c. Plan for transfer to unit. CM to continue to monitor for additional needs.    Expected Discharge Date:                  Expected Discharge Plan:  Home w Home Health Services  In-House Referral:  NA  Discharge planning Services  CM Consult  Post Acute Care Choice:  Home Health Choice offered to:     DME Arranged:    DME Agency:  [facility]  HH Arranged:    [facility] Agency:     Status of Service:  In process, will continue to follow  Medicare Important Message Given:    Date Medicare IM Given:    Medicare IM give by:    Date Additional Medicare IM Given:    Additional Medicare Important Message give by:     If discussed at Long Length of Stay Meetings, dates discussed:    Additional Comments:  Gala Lewandowsky, RN 03/12/2015, 10:02 AM

## 2015-03-12 NOTE — Progress Notes (Signed)
Pt placed on Bipap by RN.  RT at bedside, adjusted Bipap for Pt comfort. Will monitor.

## 2015-03-12 NOTE — Progress Notes (Signed)
Pt placed back on Bipap per pt request. Setup and settings verified with Resp. Will continue to monitor closely.

## 2015-03-12 NOTE — Progress Notes (Signed)
Pharmacy Antibiotic Note  Juan Cameron is a 40 y.o. male admitted on 03/11/2015 with pneumonia.  Pt has had a recent admission with a full work up at that time. He was hospitalized for 10 days and just discharged yesterday. He completed a full course of Levaquin during previous hospitalization.  He is being readmitted now due to hypoxia. Pt has leukocytosis to 22.9>>27.6 but could be too prednisone taper PTA. Tm 100.3, bump in SCr 1>>1.27.  Plan: -Continue cefepime 2 g IV q8h -Decrease Vancomycin to 750 mg q 12 hour -Monitor renal fx, cultures, obtain VT at new Css  Height:  (175.3 cm) Weight: 223 lb 15.8 oz (101.6 kg) IBW/kg (Calculated) : 70.7  Temp (24hrs), Avg:98.5 F (36.9 C), Min:97.9 F (36.6 C), Max:99.1 F (37.3 C)   Recent Labs Lab 03/07/15 0738 03/11/15 0630 03/11/15 0927 03/11/15 1222 03/12/15 0434 03/12/15 1052 03/12/15 1806  WBC  --  22.9*  --   --  27.6*  --   --   CREATININE 1.11 1.01  --   --  1.23 1.27*  --   LATICACIDVEN  --   --  2.1* 1.6  --   --   --   VANCOTROUGH  --   --   --   --   --   --  27*    Estimated Creatinine Clearance: 93.6 mL/min (by C-G formula based on Cr of 1.27).    Allergies  Allergen Reactions  . Peanuts [Peanut Oil] Anaphylaxis    Antimicrobials this admission: 2/28 cefepime > 2/28 vancomycin >  Dose adjustments this admission: 3/1 Decreasing vancomycin  Microbiology results: 2/28 blood cx: ngtd 2/28 urine cx: ngtd 2/28 RVP: sent 2/28 Resp BAL sent 2/28 MRSA PCR negative   Thank you for allowing [place] to participate in this patients care. Signe Colt, PharmD Pager: [phone] 03/12/2015 7:13 PM

## 2015-03-12 NOTE — Procedures (Signed)
Central Venous Catheter Insertion Procedure Note Yvan Dority [id number] [birth year]  Procedure: Insertion of Central Venous Catheter Indications: Assessment of intravascular volume, Drug and/or fluid administration and Frequent blood sampling  Procedure Details Consent: Risks of procedure as well as the alternatives and risks of each were explained to the (patient/caregiver).  Consent for procedure obtained. Time Out: Verified patient identification, verified procedure, site/side was marked, verified correct patient position, special equipment/implants available, medications/allergies/relevent history reviewed, required imaging and test results available.  Performed  Maximum sterile technique was used including antiseptics, cap, gloves, gown, hand hygiene, mask and sheet. Skin prep: Chlorhexidine; local anesthetic administered A antimicrobial bonded/coated triple lumen catheter was placed in the left internal jugular vein using the Seldinger technique. Ultrasound guidance used.Yes.   Catheter placed to 20 cm. Blood aspirated via all 3 ports and then flushed x 3. Line sutured x 2 and dressing applied.  Evaluation Blood flow good Complications: No apparent complications Patient did tolerate procedure well. Chest X-ray ordered to verify placement.  CXR: pending.  Brett Canales Allexa Acoff ACNP [facility] PCC[facility] Pager [phone] till 3 pm If no answer page [phone] 03/12/2015, 12:13 PM

## 2015-03-12 NOTE — Procedures (Signed)
Bronchoscopy Procedure Note Roshan Roback [id number] [birth year]  Procedure: Bronchoscopy Indications: Diagnostic evaluation of the airways and Obtain specimens for culture and/or other diagnostic studies  Procedure Details Consent: Risks of procedure as well as the alternatives and risks of each were explained to the (patient/caregiver).  Consent for procedure obtained. Time Out: Verified patient identification, verified procedure, site/side was marked, verified correct patient position, special equipment/implants available, medications/allergies/relevent history reviewed, required imaging and test results available.  Performed  In preparation for procedure, patient was given 100% FiO2 and bronchoscope lubricated. Sedation: Benzodiazepines and Muscle relaxants  Airway entered and the following bronchi were examined: Bronchi.   Procedures performed: BAL performed LLL, airways appeared clean & free of secretions Bronchoscope removed.  , Patient placed back on 100% FiO2 at conclusion of procedure.    Evaluation Hemodynamic Status: BP stable throughout; O2 sats: stable throughout Patient's Current Condition: stable Specimens:  Sent clear  fluid Complications: No apparent complications Patient did tolerate procedure well.   Bahar Shelden V. 03/12/2015

## 2015-03-13 ENCOUNTER — Inpatient Hospital Stay (HOSPITAL_COMMUNITY): Payer: Medicare Other

## 2015-03-13 DIAGNOSIS — N179 Acute kidney failure, unspecified: Secondary | ICD-10-CM

## 2015-03-13 DIAGNOSIS — F840 Autistic disorder: Secondary | ICD-10-CM

## 2015-03-13 DIAGNOSIS — J80 Acute respiratory distress syndrome: Secondary | ICD-10-CM

## 2015-03-13 LAB — POCT I-STAT 3, ART BLOOD GAS (G3+)
Acid-Base Excess: 3 mmol/L — ABNORMAL HIGH (ref 0.0–2.0)
BICARBONATE: 27 meq/L — AB (ref 20.0–24.0)
O2 Saturation: 96 %
PCO2 ART: 38 mmHg (ref 35.0–45.0)
PO2 ART: 77 mmHg — AB (ref 80.0–100.0)
TCO2: 28 mmol/L (ref 0–100)
pH, Arterial: 7.459 — ABNORMAL HIGH (ref 7.350–7.450)

## 2015-03-13 LAB — PROCALCITONIN: Procalcitonin: 0.16 ng/mL

## 2015-03-13 LAB — BASIC METABOLIC PANEL
Anion gap: 13 (ref 5–15)
BUN: 35 mg/dL — ABNORMAL HIGH (ref 6–20)
CHLORIDE: 98 mmol/L — AB (ref 101–111)
CO2: 26 mmol/L (ref 22–32)
CREATININE: 1.72 mg/dL — AB (ref 0.61–1.24)
Calcium: 8.7 mg/dL — ABNORMAL LOW (ref 8.9–10.3)
GFR calc non Af Amer: 49 mL/min — ABNORMAL LOW (ref >60)
GFR, EST AFRICAN AMERICAN: 57 mL/min — AB (ref >60)
Glucose, Bld: 176 mg/dL — ABNORMAL HIGH (ref 65–99)
POTASSIUM: 4.4 mmol/L (ref 3.5–5.1)
Sodium: 137 mmol/L (ref 135–145)

## 2015-03-13 LAB — URINE CULTURE: Culture: NO GROWTH

## 2015-03-13 LAB — GLUCOSE, CAPILLARY
Glucose-Capillary: 123 mg/dL — ABNORMAL HIGH (ref 65–99)
Glucose-Capillary: 118 mg/dL — ABNORMAL HIGH (ref 65–99)

## 2015-03-13 MED ORDER — VITAL HIGH PROTEIN PO LIQD
1000.0000 mL | ORAL | Status: DC
  Administered 2015-03-13: 1000 mL
  Administered 2015-03-13: 19:00:00
  Administered 2015-03-14: 1000 mL

## 2015-03-13 MED ORDER — MIDAZOLAM HCL 2 MG/2ML IJ SOLN: 2.0000 mg | Freq: Once | INTRAMUSCULAR | Status: DC | PRN

## 2015-03-13 MED ORDER — PRO-STAT SUGAR FREE PO LIQD
60.0000 mL | Freq: Three times a day (TID) | ORAL | Status: DC
  Administered 2015-03-13 – 2015-03-14 (×4): 60 mL
  Filled 2015-03-13 (×6): qty 60

## 2015-03-13 MED ORDER — SODIUM CHLORIDE 0.9 % IV BOLUS (SEPSIS)
1000.0000 mL | Freq: Once | INTRAVENOUS | Status: AC
  Administered 2015-03-13: 1000 mL via INTRAVENOUS

## 2015-03-13 MED ORDER — SODIUM CHLORIDE 0.9 % IV SOLN
1.0000 mg/h | INTRAVENOUS | Status: DC
  Administered 2015-03-13 – 2015-03-14 (×3): 4 mg/h via INTRAVENOUS
  Administered 2015-03-15: 5 mg/h via INTRAVENOUS
  Administered 2015-03-15: 4 mg/h via INTRAVENOUS
  Administered 2015-03-15: 2.5 mg/h via INTRAVENOUS
  Administered 2015-03-16: 5 mg/h via INTRAVENOUS
  Filled 2015-03-13 (×8): qty 10

## 2015-03-13 MED ORDER — MIDAZOLAM BOLUS VIA INFUSION
2.0000 mg | INTRAVENOUS | Status: DC | PRN
  Administered 2015-03-16: 2 mg via INTRAVENOUS
  Filled 2015-03-13 (×2): qty 2

## 2015-03-13 MED ORDER — MIDAZOLAM HCL 2 MG/2ML IJ SOLN: 2.0000 mg | Freq: Once | INTRAMUSCULAR | Status: DC

## 2015-03-13 MED ORDER — ARTIFICIAL TEARS OP OINT
1.0000 "application " | TOPICAL_OINTMENT | Freq: Three times a day (TID) | OPHTHALMIC | Status: DC
  Administered 2015-03-13 – 2015-03-14 (×3): 1 via OPHTHALMIC

## 2015-03-13 NOTE — Progress Notes (Signed)
PULMONARY / CRITICAL CARE MEDICINE   Name: Juan Cameron MRN: [id number] DOB: 1975/02/24    ADMISSION DATE:  03/11/2015 CONSULTATION DATE:  2/28  REFERRING MD:  Triad  CHIEF COMPLAINT:  resp failure  HISTORY OF PRESENT ILLNESS:   39 autistic male recently in hospital for multiloba pan and dc'd on 6 l [place] and returned in 24 hours with sats in 50%. He was discharged on 03/10/15 after treatment for CAP, NOI, and was on 6 l [place]. [facility]) checked sats and he was 50% and therefore  was transported back to [facility] and required 80% fio2 on NIVMS with RR 45.  CXR consistent early ARDS, 2d with nl EF but grade 2 dysfunction. PCCM called to bedside on 3/1 with anticipation of requiring to be intubated. PAST MEDICAL HISTORY :  He  has a past medical history of Autism; Sickle cell trait (HCC); Pneumonia (02/2015); and Autism.  PAST SURGICAL HISTORY: He  has past surgical history that includes No past surgeries.  Allergies  Allergen Reactions  . Peanuts [Peanut Oil] Anaphylaxis    No current facility-administered medications on file prior to encounter.   Current Outpatient Prescriptions on File Prior to Encounter  Medication Sig  . albuterol (PROAIR HFA) 108 (90 Base) MCG/ACT inhaler Inhale 2 puffs into the lungs every 6 (six) hours as needed for wheezing or shortness of breath.   . guaiFENesin (MUCINEX) 600 MG 12 hr tablet Take 600 mg by mouth 2 (two) times daily as needed for cough or to loosen phlegm.  . predniSONE (DELTASONE) 20 MG tablet Take 2 tablets (40 mg total) by mouth daily with breakfast. 40 mg for 3 days then 20 mg for 4 days then 10 mg for 2 days    FAMILY HISTORY:  His has no family status information on file.   SOCIAL HISTORY: He  reports that he has never smoked. He has never used smokeless tobacco. He reports that he does not drink alcohol or use illicit drugs.  REVIEW OF SYSTEMS:   Unable to obtain  SUBJECTIVE:   Patient is sedated and para lysed  On vent  support  VITAL SIGNS: BP 98/66 mmHg  Pulse 72  Temp(Src) 98.1 F (36.7 C) (Oral)  Resp 28  Ht 5\' 7"  (1.702 m)  Wt 219 lb 6.4 oz (99.519 kg)  BMI 34.35 kg/m2  SpO2 90%  HEMODYNAMICS:    VENTILATOR SETTINGS: Vent Mode:  [-] PRVC FiO2 (%):  [60 %-100 %] 60 % Set Rate:  [28 bmp] 28 bmp Vt Set:  [460 mL] 460 mL PEEP:  [14 cmH20] 14 cmH20 Plateau Pressure:  [26 cmH20-43 cmH20] 33 cmH20  INTAKE / OUTPUT: I/O last 3 completed shifts: In: 2398.6 [P.O.:480; I.V.:1468.6; IV Piggyback:450] Out: 2955 [Urine:2955]  PHYSICAL EXAMINATION: General: Sedated, paralyzed Neuro:  Not obtained HEENT: Conjugate gaze,no disharge Cardiovascular: .S1S2, no MRG Lungs: Diminshed throughout Abdomen:  Obese , protrubent, hypoactive bowel sounds. Musculoskeletal:  intact Skin:  Warm, moist  LABS:  BMET  Recent Labs Lab 03/12/15 0434 03/12/15 1052 03/13/15 0417  NA 138 139 137  K 5.3* 4.5 4.4  CL 100* 98* 98*  CO2 26 27 26   BUN 16 19 35*  CREATININE 1.23 1.27* 1.72*  GLUCOSE 120* 130* 176*    Electrolytes  Recent Labs Lab 03/11/15 1702 03/12/15 0434 03/12/15 1052 03/13/15 0417  CALCIUM  --  8.8* 9.0 8.7*  MG 2.2  --   --   --   PHOS 3.9  --   --   --  CBC  Recent Labs Lab 03/11/15 0630 03/12/15 0434  WBC 22.9* 27.6*  HGB 13.8 13.1  HCT 40.9 39.6  PLT 147* 152    Coag's No results for input(s): APTT, INR in the last 168 hours.  Sepsis Markers  Recent Labs Lab 03/09/15 0420 03/11/15 0925 03/11/15 0927 03/11/15 1222 03/13/15 0417  LATICACIDVEN  --   --  2.1* 1.6  --   PROCALCITON 0.12 <0.10  --   --  0.16    ABG  Recent Labs Lab 03/11/15 0642 03/12/15 1050 03/12/15 1314  PHART 7.391 7.402 7.406  PCO2ART 48.5* 46.2* 48.0*  PO2ART 67.0* 58.1* 56.0*    Liver Enzymes  Recent Labs Lab 03/12/15 0434  AST 51*  ALT 25  ALKPHOS 44  BILITOT 0.6  ALBUMIN 2.9*    Cardiac Enzymes No results for input(s): TROPONINI, PROBNP in the last 168  hours.  Glucose  Recent Labs Lab 03/12/15 1037  GLUCAP 126*    Imaging Portable Chest Xray  03/13/2015  CLINICAL DATA:  Acute respiratory failure with hypoxia, healthcare associated pneumonia, ARDS. EXAM: PORTABLE CHEST 1 VIEW COMPARISON:  Portable chest x-ray of March 12, 2015 FINDINGS: The lungs are well-expanded. Subtle interstitial density at both lung bases is less conspicuous today. There is no pleural effusion or pneumothorax. The cardiac silhouette is at the upper limits of normal for size but stable. The pulmonary vascularity is not engorged. The endotracheal tube tip lies 5.5 cm above the carina. The left internal jugular venous catheter tip projects over the proximal SVC. The esophagogastric tube tip projects below the inferior margin of the image. IMPRESSION: Further slight interval improvement in the appearance of the bibasilar interstitial infiltrates. There is no significant pleural effusion nor pulmonary edema. The support tubes are in stable position. Electronically Signed   By: David  [place] M.D.   On: 03/13/2015 07:19   Dg Chest Port 1 View  03/12/2015  CLINICAL DATA:  Acute respiratory failure EXAM: PORTABLE CHEST 1 VIEW COMPARISON:  Chest radiograph from earlier today. FINDINGS: Endotracheal tube tip is 2.4 cm above the carina. Left internal jugular central venous catheter terminates in the middle third of the superior vena cava. Stable cardiomediastinal silhouette with normal heart size. No pneumothorax. No pleural effusion. Patchy consolidation at the left greater than right lung bases, not appreciably changed. Overall improved lung volumes. No pulmonary edema. IMPRESSION: 1. Well-positioned support structures.  Improved lung volumes. 2. Persistent patchy consolidation at the left greater than right lung bases, suspicious for multifocal pneumonia versus aspiration. Electronically Signed   By: Delbert Phenix M.D.   On: 03/12/2015 14:54   Dg Chest Port 1 View  03/12/2015  CLINICAL  DATA:  Post intubation, respiratory failure. Central line placement EXAM: PORTABLE CHEST 1 VIEW COMPARISON:  03/11/2015 FINDINGS: Endotracheal tube is 3 cm above the carina. Left central line tip is in the SVC. No pneumothorax. There is cardiomegaly with vascular congestion and bilateral airspace opacities. Low lung volumes. No visible effusions. No acute bony abnormality. IMPRESSION: Endotracheal tube and left central line as above.  No pneumothorax. Continued bilateral airspace opacities, likely edema. Electronically Signed   By: Charlett Nose M.D.   On: 03/12/2015 12:29   Dg Abd Portable 1v  03/12/2015  CLINICAL DATA:  NG tube placement EXAM: PORTABLE ABDOMEN - 1 VIEW COMPARISON:  03/12/2015 FINDINGS: NG tube with tip in the gastric antrum. Side port within the stomach. Gas-filled loops of colon. LEFT basilar atelectasis. IMPRESSION: Advancement of the NG tube with tip  in the gastric antrum. Electronically Signed   By: Genevive Bi M.D.   On: 03/12/2015 16:33   Dg Abd Portable 1v  03/12/2015  CLINICAL DATA:  Orogastric tube placement EXAM: PORTABLE ABDOMEN - 1 VIEW COMPARISON:  None. FINDINGS: The orogastric tube tip is in the proximal stomach. The side port of the tube is not seen and may well be above the diaphragm. The overall bowel gas pattern is unremarkable without obstruction or free air evident. There is moderate stool in the colon. IMPRESSION: Orogastric tube tip is in the proximal stomach. The side port of the tube is not seen. Suspect side-port in distal esophagus. Advise advancing the tube 5 to 7 cm to insure that the tube tip and side port are both well within the stomach. Overall bowel gas pattern unremarkable. Electronically Signed   By: Bretta Bang III M.D.   On: 03/12/2015 13:16     STUDIES:  2/24 2 d with nl ef , grade 2 dysfunction  CULTURES: 2/28 bc x 2  negative 2/28 uc negative 2/28 RVP negative   ANTIBIOTICS: 2/28 vanc>> 2/28 cefepime>>  SIGNIFICANT  EVENTS: 2/28 readmit to hospital  03/12/15 Intubated  3/1Bronchoscopy   LINES/TUBES: Central line >3/1   DISCUSSION: 9 autistic male recently in hospital for multiloba pan and dc'd on 6 l [place] and returned in 24 hours with sats in 50%. Was intubated on 3/1 on full vent support, with increased O2 demands, hemodynamically unstable requiring pressors.  ASSESSMENT / PLAN:  PULMONARY A: Acute hypoxic resp failure. Suspected recurrent pna Early ARDS Suspected undiagnosed OSA  P:   On full went support requiring high PEEP Continue nimbex,fentanyl,levophed Change Propofol to Versed If hemodynamically stable will start diuresing  Stop steroids as no improvement in the oxygenation Continue cefipime and Vancomycin Trend ABG Continue Duoneb CXR in morning  CARDIOVASCULAR A:  Septic Shock P:  Continue levophed  Change Propofol  To Versed due to hypotension    RENAL A:   Acute Kidney Injury P:   Trend BUN and creatininine    GASTROINTESTINAL A:   No active issues P:   Initiate trickle tube feeding. Nutrition consult in place Continue protonix  HEMATOLOGIC A:   Hx of sickle cell trait VTE  P:  Continue enoxaparin   INFECTIOUS A:   Recurrent PNA with suspected ARDS leukocytosis P:  Continue Vanc/cefipime  Follow CXR Trend pro calcitonin/lactic acid   ENDOCRINE A:   hyperglycemia  P:    Continue SSI  NEUROLOGIC A:   Mentally impaired with limited vocabulary of 500 words.  Autism  Decreased LOC from metabolic disarray.  P:   RASS goal: -4 Requires sedative and paralytics Wean sedation     Bincy Varughese,AG-ACNP Pulmonary & Critical Care

## 2015-03-13 NOTE — Progress Notes (Signed)
Initial Nutrition Assessment  DOCUMENTATION CODES:   Obesity unspecified  INTERVENTION:   Initiate tube feeding with Vital High Protein at goal rate of 10 ml/hr (240 ml/day).   60 mL Prostat liquid protein TID, which provides an additional 600 kcals and 90 grams of protein.  Tube feeding and Propofol at current rate will provide a total of 1645 kcals, 111 grams of protein, and 201 ml of free water.   NUTRITION DIAGNOSIS:   Inadequate oral intake related to inability to eat as evidenced by NPO status.  GOAL:   Provide needs based on ASPEN/SCCM guidelines  MONITOR:   Vent status, Labs, Weight trends, Skin, I & O's  REASON FOR ASSESSMENT:   Ventilator    ASSESSMENT:   40 yo autistic with limited vocabulary, recently admitted 2/17-2/27 for severe CAP.  CT scan negative for pulmonary embolism and showing bilateral lower lobe consolidation with patchy upper lobe infiltrates, rapid flu test was negative as well as urine strep antigen.  Echo showed diastolic dysfunction.  Pt had difficulty getting oxygen upon return home, his sister- POA, noted increasing hypoxia and brought him back to hospital.   Patient currently intubated on ventilator support. MV: 12.9 ml/min Temp (24hrs); Avg: 98.5 F (36.9 C), Min: 98.1 F (36.7 C), Max: 99.1 F (37.3 C) Propofol: 30.5 ml/hr, provides 805 lipid kcals NG Tube, low intermittent suction  Medications and labs reviewed. Spoke with RN regarding pt. Recommend initiating tube feeding when medically appropriate: Vital High Protein at a goal rate of 10 ml/hr (240 ml/day) with 60 ml Prostat TID.  Provides 840 kcals, 111 grams protein, and 201 ml free water.  Recommended tube feeding regimen and propofol at current rate would provide a a total of 1645 kcals.  Unable to meet nutrition needs at this time due to high rate of propofol.   Per discussion with physician in ICU rounds, RD to order TF.  Diet Order:  Diet NPO time specified  Skin:   Reviewed, no issues  Last BM:  3/1  Height:   Ht Readings from Last 1 Encounters:  03/12/15  (1.702 m)    Weight:   Wt Readings from Last 1 Encounters:  03/13/15 219 lb 6.4 oz (99.519 kg)    Ideal Body Weight:  67.3 kg  BMI:  Body mass index is 34.35 kg/(m^2).  Estimated Nutritional Needs:   Kcal:  3244-0102  Protein:  >/= 135 grams  Fluid:  1.5 L  EDUCATION NEEDS:   No education needs identified at this time  Doroteo Glassman, Dietetic Intern Pager: [phone]   Joaquin Courts, RD, LDN, CNSC Pager [phone] After Hours Pager [phone]

## 2015-03-13 NOTE — Progress Notes (Signed)
UR Completed. Ahlia Lemanski, RN, BSN.  [phone] 

## 2015-03-14 LAB — GLUCOSE, CAPILLARY
GLUCOSE-CAPILLARY: 110 mg/dL — AB (ref 65–99)
GLUCOSE-CAPILLARY: 114 mg/dL — AB (ref 65–99)
Glucose-Capillary: 116 mg/dL — ABNORMAL HIGH (ref 65–99)
Glucose-Capillary: 98 mg/dL (ref 65–99)
Glucose-Capillary: 95 mg/dL (ref 65–99)
Glucose-Capillary: 87 mg/dL (ref 65–99)

## 2015-03-14 LAB — BASIC METABOLIC PANEL
Anion gap: 10 (ref 5–15)
BUN: 49 mg/dL — ABNORMAL HIGH (ref 6–20)
CALCIUM: 8.6 mg/dL — AB (ref 8.9–10.3)
CO2: 24 mmol/L (ref 22–32)
CREATININE: 1.36 mg/dL — AB (ref 0.61–1.24)
Chloride: 108 mmol/L (ref 101–111)
Glucose, Bld: 116 mg/dL — ABNORMAL HIGH (ref 65–99)
Potassium: 3.9 mmol/L (ref 3.5–5.1)
SODIUM: 142 mmol/L (ref 135–145)

## 2015-03-14 MED ORDER — PRO-STAT SUGAR FREE PO LIQD
30.0000 mL | Freq: Two times a day (BID) | ORAL | Status: DC
  Administered 2015-03-14 – 2015-04-07 (×47): 30 mL
  Filled 2015-03-14 (×73): qty 30

## 2015-03-14 MED ORDER — VITAL HIGH PROTEIN PO LIQD
1000.0000 mL | ORAL | Status: DC
  Administered 2015-03-15 – 2015-03-20 (×7): 1000 mL
  Administered 2015-03-20: 17:00:00
  Administered 2015-03-21: 1000 mL
  Administered 2015-03-22 – 2015-03-23 (×2)
  Administered 2015-03-23 – 2015-03-30 (×9): 1000 mL
  Administered 2015-03-31: 22:00:00
  Administered 2015-03-31: 1000 mL
  Administered 2015-03-31 (×3)
  Administered 2015-03-31: 1000 mL
  Administered 2015-03-31 – 2015-04-01 (×2)
  Administered 2015-04-01: 1000 mL
  Administered 2015-04-01 – 2015-04-02 (×6)
  Administered 2015-04-02: 1000 mL
  Administered 2015-04-02 (×4)
  Administered 2015-04-04 – 2015-04-07 (×5): 1000 mL
  Filled 2015-03-14 (×4): qty 1000

## 2015-03-14 MED ORDER — ENOXAPARIN SODIUM 60 MG/0.6ML ~~LOC~~ SOLN
50.0000 mg | SUBCUTANEOUS | Status: DC
  Administered 2015-03-14 – 2015-03-19 (×6): 50 mg via SUBCUTANEOUS
  Filled 2015-03-14 (×3): qty 0.5
  Filled 2015-03-14: qty 1
  Filled 2015-03-14 (×4): qty 0.5

## 2015-03-14 MED ORDER — VITAL HIGH PROTEIN PO LIQD: 1000.0000 mL | ORAL | Status: DC

## 2015-03-14 NOTE — Progress Notes (Signed)
Nutrition Follow-up  DOCUMENTATION CODES:   Obesity unspecified  INTERVENTION:    Continue TF via NGT with Vital High Protein, increase goal rate to 50 ml/h with Prostat 30 ml BID to provide 1400 kcals, 135 gm protein, 1003 ml free water daily. This will meet 100% of estimated calorie and protein needs.  NUTRITION DIAGNOSIS:   Inadequate oral intake related to inability to eat as evidenced by NPO status.  Ongoing  GOAL:   Provide needs based on ASPEN/SCCM guidelines  Progressing  MONITOR:   Vent status, Labs, Weight trends, Skin, I & O's  ASSESSMENT:   40 yo autistic with limited vocabulary, recently admitted 2/17-2/27 for severe CAP.  CT scan negative for pulmonary embolism and showing bilateral lower lobe consolidation with patchy upper lobe infiltrates, rapid flu test was negative as well as urine strep antigen.  Echo showed diastolic dysfunction.  Pt had difficulty getting oxygen upon return home, his sister- POA, noted increasing hypoxia and brought him back to hospital.  Discussed patient in ICU rounds and with RN today. Propofol now off. Will adjust TF rate to better meet nutrition needs.  Patient is currently intubated on ventilator support. Temp (24hrs), Avg:97.9 F (36.6 C), Min:97.5 F (36.4 C), Max:98.4 F (36.9 C)  Propofol: off   Diet Order:  Diet NPO time specified  Skin:  Reviewed, no issues  Last BM:  3/1  Height:   Ht Readings from Last 1 Encounters:  03/12/15 5\' 7"  (1.702 m)    Weight:   Wt Readings from Last 1 Encounters:  03/14/15 222 lb 0.1 oz (100.7 kg)    Ideal Body Weight:  67.3 kg  BMI:  Body mass index is 34.76 kg/(m^2).  Estimated Nutritional Needs:   Kcal:  [id number]  Protein:  >/= 135 grams  Fluid:  1.5 L  EDUCATION NEEDS:   No education needs identified at this time  Joaquin CourtsKimberly Harris, RD, LDN, CNSC Pager [phone] After Hours Pager [phone]

## 2015-03-14 NOTE — Care Management Important Message (Signed)
Important Message  Patient Details  Name: Juan RipperRobert Moroney MRN: [id number] Date of Birth: Jul 20, 1975   Medicare Important Message Given:  Yes    Sylvie Mifsud Abena 03/14/2015, 11:56 AM

## 2015-03-14 NOTE — Progress Notes (Signed)
PULMONARY / CRITICAL CARE MEDICINE   Name: Juan RipperRobert Cameron MRN: [id number] DOB: 1975/02/14    ADMISSION DATE:  03/11/2015 CONSULTATION DATE:  2/28  REFERRING MD:  Triad  CHIEF COMPLAINT:  resp failure  BRIEF:   89 autistic male recently in hospital for multilobar pneumonia and dc'd on O2 (6 L[place]) and returned in 24 hours with sats in 50%. Intubated on 3/1, treated with ARDS protocol/NIMBEX.   SUBJECTIVE:  Oxygenation improved Off pressors no acute events  VITAL SIGNS: BP 99/55 mmHg  Pulse 78  Temp(Src) 98.2 F (36.8 C) (Oral)  Resp 28  Ht 5\' 7"  (1.702 m)  Wt 100.7 kg (222 lb 0.1 oz)  BMI 34.76 kg/m2  SpO2 100%  HEMODYNAMICS:    VENTILATOR SETTINGS: Vent Mode:  [-] PRVC FiO2 (%):  [40 %-60 %] 40 % Set Rate:  [14 bmp-28 bmp] 28 bmp Vt Set:  [460 mL] 460 mL PEEP:  [8 cmH20-14 cmH20] 8 cmH20 Plateau Pressure:  [24 cmH20-36 cmH20] 27 cmH20  INTAKE / OUTPUT: I/O last 3 completed shifts: In: 4290.8 [I.V.:2370; Other:10; NG/GT:210.8; IV Piggyback:1700] Out: 2930 [Urine:2930]  PHYSICAL EXAMINATION: General: Sedated, paralyzed Neuro:  paralyzed HEENT: NCAT ETT in place Cardiovascular: .RRR, no mgr Lungs: rhonchi scant, mostly clear bilaterally Abdomen:  BS+, soft, nontender Musculoskeletal:  Paralyzed, no bony deformiteis Skin:  Warm, moist   LABS:  BMET  Recent Labs Lab 03/12/15 1052 03/13/15 0417 03/14/15 0420  NA 139 137 142  K 4.5 4.4 3.9  CL 98* 98* 108  CO2 27 26 24   BUN 19 35* 49*  CREATININE 1.27* 1.72* 1.36*  GLUCOSE 130* 176* 116*    Electrolytes  Recent Labs Lab 03/11/15 1702  03/12/15 1052 03/13/15 0417 03/14/15 0420  CALCIUM  --   < > 9.0 8.7* 8.6*  MG 2.2  --   --   --   --   PHOS 3.9  --   --   --   --   < > = values in this interval not displayed.  CBC  Recent Labs Lab 03/11/15 0630 03/12/15 0434  WBC 22.9* 27.6*  HGB 13.8 13.1  HCT 40.9 39.6  PLT 147* 152    Coag's No results for input(s): APTT, INR in the last 168  hours.  Sepsis Markers  Recent Labs Lab 03/09/15 0420 03/11/15 0925 03/11/15 0927 03/11/15 1222 03/13/15 0417  LATICACIDVEN  --   --  2.1* 1.6  --   PROCALCITON 0.12 <0.10  --   --  0.16    ABG  Recent Labs Lab 03/12/15 1050 03/12/15 1314 03/13/15 1225  PHART 7.402 7.406 7.459*  PCO2ART 46.2* 48.0* 38.0  PO2ART 58.1* 56.0* 77.0*    Liver Enzymes  Recent Labs Lab 03/12/15 0434  AST 51*  ALT 25  ALKPHOS 44  BILITOT 0.6  ALBUMIN 2.9*    Cardiac Enzymes No results for input(s): TROPONINI, PROBNP in the last 168 hours.  Glucose  Recent Labs Lab 03/12/15 1037 03/13/15 1558 03/13/15 2101 03/14/15 0003 03/14/15 0337 03/14/15 0835  GLUCAP 126* 123* 118* 114* 110* 116*    Imaging No results found.   STUDIES:  2/24 ECHO with normal LVEF , grade 2 diastolic dysfunction  CULTURES: 2/28 bc x 2  negative 2/28 uc negative 2/28 RVP negative   ANTIBIOTICS: 2/28 vanc>>  2/28 cefepime>>  SIGNIFICANT EVENTS: 2/28 readmit to hospital  03/12/15 Intubated  3/1Bronchoscopy   LINES/TUBES: Central line >3/1   DISCUSSION: [age] autistic male recently readmitted with HCAP causing  severe respiratory failure with hypoxemia requiring intubation.  ASSESSMENT / PLAN:  PULMONARY A: Acute hypoxic resp failure HCAP ARDS> better Suspected undiagnosed OSA  P:   Stop nimbex Wean peep/FiO2 for O2 sat > 90% Holding steroids See ID VAP prevention measures  NEUROLOGIC A:   Mentally impaired with limited vocabulary of 500 words Autism  Decreased LOC from metabolic disarray High vent sedation needs for synchrony> improving P:   RASS goal: -2 Requires sedative Wean sedation  Will do WUA with sister bedside per her request  CARDIOVASCULAR A:  Septic Shock > resolved P:  Tele  RENAL A:   Acute Kidney Injury > better P:   Monitor BMET and UOP Replace electrolytes as needed    GASTROINTESTINAL A:   No active issues P:   Continue tube  feeding Continue protonix  HEMATOLOGIC A:   Hx of sickle cell trait DVT prophylaxis P:  Continue enoxaparin for dvt prophylaxis, adjust for weight  INFECTIOUS A:   HCAP with ARDS P:  Continue Vanc 5 days, cefepime 7 days Follow CXR   ENDOCRINE A:   Hyperglycemia  P:   Continue SSI   My cc time 40 minutes  Heber Bancroft, MD  PCCM Pager: [phone] Cell: [phone] After 3pm or if no response, call [phone]

## 2015-03-14 NOTE — Care Management Note (Signed)
Case Management Note  Patient Details  Name: Alan RipperRobert Joswick MRN: [id number] Date of Birth: Jun 10, 1975  Subjective/Objective:  Pt admitted from home with Respiratory Distress. Pt lives with sister - baseline lower functioning autistic.                  Action/Plan:  03/14/15 Pt in on ventilator  Pt is recent admission that was d/c home with 02 via [facility] on 4L via [place]. [facility] for [facility]. Plan for transfer to unit. CM to continue to monitor for additional needs.    Expected Discharge Date:                  Expected Discharge Plan:  Home w Home Health Services  In-House Referral:  NA  Discharge planning Services  CM Consult  Post Acute Care Choice:  Home Health Choice offered to:     DME Arranged:    DME Agency:  [facility]  [facility]     Status of Service:  In process, will continue to follow  Medicare Important Message Given:  Yes Date Medicare IM Given:    Medicare IM give by:    Date Additional Medicare IM Given:    Additional Medicare Important Message give by:     If discussed at Long Length of Stay Meetings, dates discussed:    Additional Comments:  Cherylann ParrClaxton, Livy Ross S, RN 03/14/2015, 1:31 PM

## 2015-03-14 NOTE — Progress Notes (Signed)
Patient sister to bedside, I advised her of likely wakeup assessment time in the morning.  Family plans to be here when patient wakes up.  Family expressed concern that patient was not "tied down" I advised sister that we avoid restraints if possible for patient safety.

## 2015-03-14 NOTE — Progress Notes (Signed)
Wakeup assessment deferred, patient on paralytic medication.

## 2015-03-15 ENCOUNTER — Inpatient Hospital Stay (HOSPITAL_COMMUNITY): Payer: Medicare Other

## 2015-03-15 LAB — CBC WITH DIFFERENTIAL/PLATELET
BASOS ABS: 0 10*3/uL (ref 0.0–0.1)
BASOS PCT: 0 %
Eosinophils Absolute: 0 10*3/uL (ref 0.0–0.7)
Eosinophils Relative: 0 %
HEMATOCRIT: 35.3 % — AB (ref 39.0–52.0)
HEMOGLOBIN: 12 g/dL — AB (ref 13.0–17.0)
Lymphocytes Relative: 14 %
Lymphs Abs: 2.1 10*3/uL (ref 0.7–4.0)
MCH: 27.7 pg (ref 26.0–34.0)
MCHC: 34 g/dL (ref 30.0–36.0)
MCV: 81.5 fL (ref 78.0–100.0)
Monocytes Absolute: 2.2 10*3/uL — ABNORMAL HIGH (ref 0.1–1.0)
Monocytes Relative: 15 %
NEUTROS ABS: 10.4 10*3/uL — AB (ref 1.7–7.7)
NEUTROS PCT: 71 %
Platelets: 103 10*3/uL — ABNORMAL LOW (ref 150–400)
RBC: 4.33 MIL/uL (ref 4.22–5.81)
RDW: 16 % — AB (ref 11.5–15.5)
WBC: 14.7 10*3/uL — AB (ref 4.0–10.5)

## 2015-03-15 LAB — BASIC METABOLIC PANEL
ANION GAP: 7 (ref 5–15)
BUN: 53 mg/dL — ABNORMAL HIGH (ref 6–20)
CALCIUM: 8.5 mg/dL — AB (ref 8.9–10.3)
CO2: 27 mmol/L (ref 22–32)
Chloride: 114 mmol/L — ABNORMAL HIGH (ref 101–111)
Creatinine, Ser: 1.64 mg/dL — ABNORMAL HIGH (ref 0.61–1.24)
GFR, EST NON AFRICAN AMERICAN: 52 mL/min — AB (ref >60)
Glucose, Bld: 116 mg/dL — ABNORMAL HIGH (ref 65–99)
Potassium: 5.2 mmol/L — ABNORMAL HIGH (ref 3.5–5.1)
Sodium: 148 mmol/L — ABNORMAL HIGH (ref 135–145)

## 2015-03-15 LAB — GLUCOSE, CAPILLARY
GLUCOSE-CAPILLARY: 109 mg/dL — AB (ref 65–99)
GLUCOSE-CAPILLARY: 78 mg/dL (ref 65–99)
GLUCOSE-CAPILLARY: 103 mg/dL — AB (ref 65–99)
GLUCOSE-CAPILLARY: 112 mg/dL — AB (ref 65–99)
Glucose-Capillary: 111 mg/dL — ABNORMAL HIGH (ref 65–99)
Glucose-Capillary: 126 mg/dL — ABNORMAL HIGH (ref 65–99)

## 2015-03-15 LAB — CULTURE, RESPIRATORY W GRAM STAIN: Gram Stain: NONE SEEN

## 2015-03-15 LAB — CULTURE, RESPIRATORY: CULTURE: NO GROWTH

## 2015-03-15 MED ORDER — ACETAMINOPHEN 650 MG RE SUPP: 650.0000 mg | RECTAL | Status: DC | PRN

## 2015-03-15 MED ORDER — ACETAMINOPHEN 160 MG/5ML PO SOLN
650.0000 mg | ORAL | Status: DC | PRN
  Administered 2015-03-15 – 2015-04-06 (×31): 650 mg
  Filled 2015-03-15 (×31): qty 20.3

## 2015-03-15 NOTE — Progress Notes (Signed)
RT attempted to wean patient on 5/5. Patient returned to full support and placed on previous settings. Vital signs stable at this time. No complications. RN aware. RT will continue to monitor.

## 2015-03-15 NOTE — Progress Notes (Signed)
Pharmacy Antibiotic Note  39 YOM continues on vancomycin and cefepime for HCAP.  Patient to complete vancomycin today and stop date has been established for cefepime.  His renal function is fluctuating, currently worsening.  Plan: - Continue vanc 750mg  IV Q12H through noon today.  Will not obtain trough. - Continue Cefepime 2gm IV Q8H, stop date 03/18/15 - Monitor renal fxn, clinical progress  Height: 5\' 7"  (170.2 cm) Weight: 217 lb 13 oz (98.8 kg) IBW/kg (Calculated) : 66.1  Temp (24hrs), Avg:101.8 F (38.8 C), Min:100.4 F (38 C), Max:103.1 F (39.5 C)   Recent Labs Lab 03/11/15 0630 03/11/15 0927 03/11/15 1222 03/12/15 0434 03/12/15 1052 03/12/15 1806 03/13/15 0417 03/14/15 0420 03/15/15 0455  WBC 22.9*  --   --  27.6*  --   --   --   --  14.7*  CREATININE 1.01  --   --  1.23 1.27*  --  1.72* 1.36* 1.64*  LATICACIDVEN  --  2.1* 1.6  --   --   --   --   --   --   VANCOTROUGH  --   --   --   --   --  27*  --   --   --     Estimated Creatinine Clearance: 69.1 mL/min (by C-G formula based on Cr of 1.64).    Allergies  Allergen Reactions  . Peanuts [Peanut Oil] Anaphylaxis    Antimicrobials this admission: Cefepime 2/28 >> (3/7) Vanc 2/28 >> 3/4  Dose adjustments this admission: 3/1 VT = 27 on 1g q8 >> reduced to 750 q12 (SCr 1.72)  Microbiology results: 2/28 urine cx - negative 2/28 blood cx - NGTD 2/28 RVP - negative 2/28 Flu PCR -negative 2/28 Strep UA -negative 2/28 Legionella -negative 2/28 MRSA PCR -negative   Kroy Sprung D. Laney Potashang, PharmD, BCPS Pager:  [phone] 03/15/2015, 11:27 AM

## 2015-03-15 NOTE — Progress Notes (Signed)
PULMONARY / CRITICAL CARE MEDICINE   Name: Marcy Bogosian MRN: [id number] DOB: March 31, 1975    ADMISSION DATE:  03/11/2015  CONSULTATION DATE: 2/28  REFERRING MD: Triad  CHIEF COMPLAINT: resp failure  HISTORY OF PRESENT ILLNESS:  70 autistic male recently in hospital for multiloba pan and dc'd on 6 l [place] and returned in 24 hours with sats in 50%. He was discharged on 03/10/15 after treatment for CAP, NOI, and was on 6 l [place]. [facility]) checked sats and he was 50% and therefore was transported back to [facility] and required 80% fio2 on NIVMS with RR 45. CXR consistent early ARDS, 2d with nl EF but grade 2 dysfunction. PCCM called to bedside on 3/1 with anticipation of requiring to be intubated.   SUBJECTIVE:  No issues overnight. Less FiO2.   VITAL SIGNS: BP 108/66 mmHg  Pulse 111  Temp(Src) 102.7 F (39.3 C) (Oral)  Resp 22  Ht  (1.702 m)  Wt 217 lb 13 oz (98.8 kg)  BMI 34.11 kg/m2  SpO2 96%  HEMODYNAMICS:    VENTILATOR SETTINGS: Vent Mode:  [-] PRVC FiO2 (%):  [40 %-50 %] 40 % Set Rate:  [20 bmp] 20 bmp Vt Set:  [460 mL] 460 mL PEEP:  [5 cmH20] 5 cmH20 Plateau Pressure:  [14 cmH20-25 cmH20] 25 cmH20  INTAKE / OUTPUT: I/O last 3 completed shifts: In: 3139 [I.V.:1299; NG/GT:1140; IV Piggyback:700] Out: 4995 [Urine:4995]   PHYSICAL EXAMINATION: General: awake, has eye tracking. Was agitated this am.  Neuro: Not obtained HEENT: Conjugate gaze,no disharge Cardiovascular: .S1S2, no MRG Lungs: Diminshed throughout. Crackles bibasilar.  Abdomen: Obese , protrubent, hypoactive bowel sounds. Musculoskeletal: intact Skin: Warm, moist   LABS:  BMET  Recent Labs Lab 03/13/15 0417 03/14/15 0420 03/15/15 0455  NA 137 142 148*  K 4.4 3.9 5.2*  CL 98* 108 114*  CO2 BUN 35* 49* 53*  CREATININE 1.72* 1.36* 1.64*  GLUCOSE 176* 116* 116*    Electrolytes  Recent Labs Lab 03/11/15 1702  03/13/15 0417 03/14/15 0420 03/15/15 0455  CALCIUM   --   < > 8.7* 8.6* 8.5*  MG 2.2  --   --   --   --   PHOS 3.9  --   --   --   --   < > = values in this interval not displayed.  CBC  Recent Labs Lab 03/11/15 0630 03/12/15 0434 03/15/15 0455  WBC 22.9* 27.6* 14.7*  HGB 13.8 13.1 12.0*  HCT 40.9 39.6 35.3*  PLT 147* 152 103*    Coag's No results for input(s): APTT, INR in the last 168 hours.  Sepsis Markers  Recent Labs Lab 03/09/15 0420 03/11/15 0925 03/11/15 0927 03/11/15 1222 03/13/15 0417  LATICACIDVEN  --   --  2.1* 1.6  --   PROCALCITON 0.12 <0.10  --   --  0.16    ABG  Recent Labs Lab 03/12/15 1050 03/12/15 1314 03/13/15 1225  PHART 7.402 7.406 7.459*  PCO2ART 46.2* 48.0* 38.0  PO2ART 58.1* 56.0* 77.0*    Liver Enzymes  Recent Labs Lab 03/12/15 0434  AST 51*  ALT 25  ALKPHOS 44  BILITOT 0.6  ALBUMIN 2.9*    Cardiac Enzymes No results for input(s): TROPONINI, PROBNP in the last 168 hours.  Glucose  Recent Labs Lab 03/14/15 1555 03/14/15 1956 03/15/15 0012 03/15/15 0407 03/15/15 0912 03/15/15 1244  GLUCAP 95 87 126* 109* 78 103*    Imaging Dg Chest Port 1  View  03/15/2015  CLINICAL DATA:  Acute respiratory failure with hypoxemia EXAM: PORTABLE CHEST 1 VIEW COMPARISON:  Portable exam 0515 hours compared to 03/13/2015 FINDINGS: Tip of endotracheal tube projects 1.8 cm above carina. Nasogastric tube extends into stomach. LEFT jugular central venous catheter tip projects over distal LEFT brachiocephalic vein near SVC confluence. Enlargement of cardiac silhouette. BILATERAL pulmonary infiltrates new since prior exam. Decreased lung volumes with bibasilar atelectasis. No definite pneumothorax is identified. Persistent soft tissue gas RIGHT cervical region. IMPRESSION: Decrease in lung volumes since previous exam with bibasilar atelectasis and scattered pulmonary infiltrates. Electronically Signed   By: Ulyses SouthwardMark  Boles M.D.   On: 03/15/2015 09:23   STUDIES:  2/24 2 d with nl ef , grade 2  dysfunction  CULTURES: 2/28 bc x 2 negative 2/28 uc negative 2/28 RVP negative   ANTIBIOTICS: 2/28 vanc>> 2/28 cefepime>>  SIGNIFICANT EVENTS: 2/28 readmit to hospital  03/12/15 Intubated 3/1Bronchoscopy   LINES/TUBES: Central line >3/1   DISCUSSION: [age] autistic male recently in hospital for multiloba pan and dc'd on 6 l [place] and returned in 24 hours with sats in 50%. Was intubated on 3/1 on full vent support, with increased O2 demands, hemodynamically unstable requiring pressors.  ASSESSMENT / PLAN:  PULMONARY A: Acute hypoxic resp failure. Suspected recurrent pna/HCAP/ asp pna.  Early ARDS Suspected undiagnosed OSA  P:  On full went. On 40% Fio2 and PEEP 5. Better oxygenation but not ready for extubation. Was tachypneic with PST this am. Tachycardic as well.  Daily PST. Cont Cefepime and vanc.     CARDIOVASCULAR A:  Septic Shock -- better.  P:  Off pressors.    RENAL A:  Acute Kidney Injury P:  Trend BUN and creatininine Creat stable.    GASTROINTESTINAL A:  No active issues P:     HEMATOLOGIC A:  Hx of sickle cell trait VTE  P:  Continue enoxaparin   INFECTIOUS A:  Recurrent PNA with suspected ARDS Leukocytosis Fever this am. Cultures (-) so far.   P:  Continue Vanc/cefipime  Follow CXR Send for blood and sputum culture.    ENDOCRINE A:  hyperglycemia  P:  Continue SSI  NEUROLOGIC A:  Mentally impaired with limited vocabulary of 500 words.  Autism  Decreased LOC from metabolic disarray.  P:  RASS goal: -1 Requires sedative and paralytics Propofol dropped BP. On fantanyl and versed. May need precedex.   Family updated at bedside.   Critical care time spent today : 30 minutes.      Pollie MeyerJ. [name], MD Pulmonary and Critical Care Medicine [facility] Pager: [phone] After 3 pm or if no response, call [phone]  03/15/2015, 12:56 PM

## 2015-03-16 ENCOUNTER — Inpatient Hospital Stay (HOSPITAL_COMMUNITY): Payer: Medicare Other

## 2015-03-16 LAB — GLUCOSE, CAPILLARY
GLUCOSE-CAPILLARY: 128 mg/dL — AB (ref 65–99)
GLUCOSE-CAPILLARY: 115 mg/dL — AB (ref 65–99)
GLUCOSE-CAPILLARY: 153 mg/dL — AB (ref 65–99)
Glucose-Capillary: 144 mg/dL — ABNORMAL HIGH (ref 65–99)
Glucose-Capillary: 129 mg/dL — ABNORMAL HIGH (ref 65–99)
Glucose-Capillary: 168 mg/dL — ABNORMAL HIGH (ref 65–99)

## 2015-03-16 LAB — CBC
HEMATOCRIT: 38.5 % — AB (ref 39.0–52.0)
Hemoglobin: 12.6 g/dL — ABNORMAL LOW (ref 13.0–17.0)
MCH: 27.6 pg (ref 26.0–34.0)
MCHC: 32.7 g/dL (ref 30.0–36.0)
MCV: 84.4 fL (ref 78.0–100.0)
Platelets: 99 10*3/uL — ABNORMAL LOW (ref 150–400)
RBC: 4.56 MIL/uL (ref 4.22–5.81)
RDW: 17 % — AB (ref 11.5–15.5)
WBC: 18.7 10*3/uL — ABNORMAL HIGH (ref 4.0–10.5)

## 2015-03-16 LAB — BASIC METABOLIC PANEL
Anion gap: 8 (ref 5–15)
BUN: 36 mg/dL — AB (ref 6–20)
CHLORIDE: 116 mmol/L — AB (ref 101–111)
CO2: 29 mmol/L (ref 22–32)
Calcium: 8.4 mg/dL — ABNORMAL LOW (ref 8.9–10.3)
Creatinine, Ser: 1.28 mg/dL — ABNORMAL HIGH (ref 0.61–1.24)
GFR calc Af Amer: >60 mL/min (ref >60)
GLUCOSE: 149 mg/dL — AB (ref 65–99)
POTASSIUM: 4.7 mmol/L (ref 3.5–5.1)
Sodium: 153 mmol/L — ABNORMAL HIGH (ref 135–145)

## 2015-03-16 LAB — CULTURE, BLOOD (ROUTINE X 2)
CULTURE: NO GROWTH
Culture: NO GROWTH

## 2015-03-16 LAB — GRAM STAIN

## 2015-03-16 MED ORDER — DEXMEDETOMIDINE HCL IN NACL 400 MCG/100ML IV SOLN
0.4000 ug/kg/h | INTRAVENOUS | Status: DC
  Administered 2015-03-16: 0.9 ug/kg/h via INTRAVENOUS
  Administered 2015-03-16: 0.85 ug/kg/h via INTRAVENOUS
  Administered 2015-03-16: 0.4 ug/kg/h via INTRAVENOUS
  Administered 2015-03-16: 0.9 ug/kg/h via INTRAVENOUS
  Administered 2015-03-17: 1.2 ug/kg/h via INTRAVENOUS
  Administered 2015-03-17: 1.2 ug/h via INTRAVENOUS
  Administered 2015-03-17: 0.6 ug/kg/h via INTRAVENOUS
  Administered 2015-03-17: 1.2 ug/h via INTRAVENOUS
  Administered 2015-03-17: 1.1 ug/kg/h via INTRAVENOUS
  Administered 2015-03-17: 1.2 ug/kg/h via INTRAVENOUS
  Administered 2015-03-22: 0.2 ug/kg/h via INTRAVENOUS
  Administered 2015-03-22: 0.4 ug/kg/h via INTRAVENOUS
  Filled 2015-03-16 (×5): qty 100
  Filled 2015-03-16: qty 50
  Filled 2015-03-16 (×6): qty 100

## 2015-03-16 MED ORDER — VANCOMYCIN HCL IN DEXTROSE 1-5 GM/200ML-% IV SOLN
1000.0000 mg | Freq: Two times a day (BID) | INTRAVENOUS | Status: DC
  Administered 2015-03-16 – 2015-03-18 (×5): 1000 mg via INTRAVENOUS
  Filled 2015-03-16 (×7): qty 200

## 2015-03-16 NOTE — Progress Notes (Signed)
Pharmacy Antibiotic Note  39 YOM previously on vancomycin and cefepime for HCAP.  Vancomycin was discontinued yesterday, but to resume today given no significant improvements.  Patient's renal function is improving.  Plan: - Continue vanc 1000mg  IV Q12H - Continue Cefepime 2gm IV Q8H, stop date 03/18/15 - Monitor renal fxn, clinical progress, vanc trough at Css  Height: 5\' 7"  (170.2 cm) Weight: 213 lb 10 oz (96.9 kg) IBW/kg (Calculated) : 66.1  Temp (24hrs), Avg:100.7 F (38.2 C), Min:99.2 F (37.3 C), Max:102.5 F (39.2 C)   Recent Labs Lab 03/11/15 0630 03/11/15 0927 03/11/15 1222 03/12/15 0434 03/12/15 1052 03/12/15 1806 03/13/15 0417 03/14/15 0420 03/15/15 0455 03/16/15 1208  WBC 22.9*  --   --  27.6*  --   --   --   --  14.7* 18.7*  CREATININE 1.01  --   --  1.23 1.27*  --  1.72* 1.36* 1.64* 1.28*  LATICACIDVEN  --  2.1* 1.6  --   --   --   --   --   --   --   VANCOTROUGH  --   --   --   --   --  27*  --   --   --   --     Estimated Creatinine Clearance: 87.6 mL/min (by C-G formula based on Cr of 1.28).    Allergies  Allergen Reactions  . Peanuts [Peanut Oil] Anaphylaxis    Antimicrobials this admission: Cefepime 2/28 >> (3/7) Vanc 2/28 >> 3/4, resumed 3/5 >>  Dose adjustments this admission: 3/1 VT = 27 on 1g q8 >> reduced to 750 q12 (SCr 1.72)  Microbiology results: 2/28 urine cx - negative 2/28 blood cx - NGTD 2/28 RVP - negative 2/28 Flu PCR -negative 2/28 Strep UA -negative 2/28 Legionella -negative 2/28 MRSA PCR -negative   Juan Cameron D. Laney Potashang, PharmD, BCPS Pager:  [phone] 03/16/2015, 12:48 PM

## 2015-03-16 NOTE — Progress Notes (Signed)
PULMONARY / CRITICAL CARE MEDICINE   Name: Juan Cameron MRN: [id number] DOB: 1975/05/28    ADMISSION DATE:  03/11/2015  CONSULTATION DATE: 2/28  REFERRING MD: Triad  CHIEF COMPLAINT: resp failure  HISTORY OF PRESENT ILLNESS:  [age] autistic male recently in hospital for multiloba pan and dc'd on 6 l [place] and returned in 24 hours with sats in 50%. He was discharged on 03/10/15 after treatment for CAP, NOI, and was on 6 l [place]. [facility] checked sats and he was 50% and therefore [facility] and required 80% fio2 on NIVMS with RR 45. CXR consistent early ARDS, 2d with nl EF but grade 2 dysfunction. PCCM called to bedside on 3/1 with anticipation of requiring to be intubated.   SUBJECTIVE:  No issues overnight.Was agitated so sedation increased. Too sleepy for PST.    VITAL SIGNS: BP 127/68 mmHg  Pulse 119  Temp(Src) 102.5 F (39.2 C) (Oral)  Resp 19  Ht 5\' 7"  (1.702 m)  Wt 213 lb 10 oz (96.9 kg)  BMI 33.45 kg/m2  SpO2 95%  HEMODYNAMICS:    VENTILATOR SETTINGS: Vent Mode:  [-] PRVC FiO2 (%):  [40 %] 40 % Set Rate:  [20 bmp] 20 bmp Vt Set:  [460 mL] 460 mL PEEP:  [5 cmH20] 5 cmH20 Plateau Pressure:  [23 cmH20-24 cmH20] 24 cmH20  INTAKE / OUTPUT: I/O last 3 completed shifts: In: 3401.4 [I.V.:1366.4; NG/GT:1485; IV Piggyback:550] Out: 7830 [Urine:7830]  PHYSICAL EXAMINATION: General: awake, has eye tracking.  Neuro: Not obtained HEENT: Conjugate gaze,no disharge Cardiovascular: .S1S2, no MRG Lungs: Diminshed throughout. Crackles bibasilar.  Abdomen: Obese , protrubent, hypoactive bowel sounds. Musculoskeletal: intact Skin: Warm, moist  LABS:  BMET  Recent Labs Lab 03/13/15 0417 03/14/15 0420 03/15/15 0455  NA 137 142 148*  K 4.4 3.9 5.2*  CL 98* 108 114*  CO2 26 24 27   BUN 35* 49* 53*  CREATININE 1.72* 1.36* 1.64*  GLUCOSE 176* 116* 116*    Electrolytes  Recent Labs Lab 03/11/15 1702  03/13/15 0417 03/14/15 0420  03/15/15 0455  CALCIUM  --   < > 8.7* 8.6* 8.5*  MG 2.2  --   --   --   --   PHOS 3.9  --   --   --   --   < > = values in this interval not displayed.  CBC  Recent Labs Lab 03/11/15 0630 03/12/15 0434 03/15/15 0455  WBC 22.9* 27.6* 14.7*  HGB 13.8 13.1 12.0*  HCT 40.9 39.6 35.3*  PLT 147* 152 103*    Coag's No results for input(s): APTT, INR in the last 168 hours.  Sepsis Markers  Recent Labs Lab 03/11/15 0925 03/11/15 0927 03/11/15 1222 03/13/15 0417  LATICACIDVEN  --  2.1* 1.6  --   PROCALCITON <0.10  --   --  0.16    ABG  Recent Labs Lab 03/12/15 1050 03/12/15 1314 03/13/15 1225  PHART 7.402 7.406 7.459*  PCO2ART 46.2* 48.0* 38.0  PO2ART 58.1* 56.0* 77.0*    Liver Enzymes  Recent Labs Lab 03/12/15 0434  AST 51*  ALT 25  ALKPHOS 44  BILITOT 0.6  ALBUMIN 2.9*    Cardiac Enzymes No results for input(s): TROPONINI, PROBNP in the last 168 hours.  Glucose  Recent Labs Lab 03/15/15 0912 03/15/15 1244 03/15/15 1641 03/15/15 1923 03/15/15 2329 03/16/15 0346  GLUCAP 78 103* 112* 111* 144* 128*    Imaging Dg Chest Port 1 View  03/16/2015  CLINICAL DATA:  Re-evaluate pulmonary infiltrates EXAM: PORTABLE CHEST 1 VIEW COMPARISON:  03/15/2015 FINDINGS: Endotracheal tube tip about 1 cm above the carina. Tip of left internal jugular central line projects over the left distal brachiocephalic vein. Persistent cardiac silhouette enlargement. Moderate bibasilar infiltrates again identified similar to prior study. IMPRESSION: Bibasilar infiltrates without significant change Electronically Signed   By: Esperanza Heir M.D.   On: 03/16/2015 08:42   CULTURES: 2/28 bc x 2 negative 2/28 uc negative 2/28 RVP negative Sputum culture 3/4 >> Blood culture 3/4 >>   ANTIBIOTICS: 2/28 vanc>> 2/28 cefepime>>  SIGNIFICANT EVENTS: 2/28 readmit to hospital  03/12/15 Intubated 3/1Bronchoscopy   LINES/TUBES: Central line >3/1   DISCUSSION: 27 autistic  male recently in hospital for multiloba pan and dc'd on 6 l [name] and returned in 24 hours with sats in 50%. Was intubated on 3/1 on full vent support, with increased O2 demands, hemodynamically unstable requiring pressors.  ASSESSMENT / PLAN:  PULMONARY A: Acute hypoxic resp failure. Suspected recurrent pna/HCAP/ asp pna.  Early ARDS Suspected undiagnosed OSA  P:  On full went. On 40% Fio2 and PEEP 5. Better oxygenation but not ready for extubation. Was tachypneic with PST this am. Tachycardic as well.  Daily PST. Cont Cefepime. Resume Vanc. Pt spiking fevers. Will send sputum again.     CARDIOVASCULAR A:  Septic Shock -- better.  P:  Off pressors. Tachycardic 2 to fever.     RENAL A:  Acute Kidney Injury P:  Trend BUN and creatininine Creat stable.    GASTROINTESTINAL A:  No active issues P:  Cont TF   HEMATOLOGIC A:  Hx of sickle cell trait VTE  P:  Continue enoxaparin   INFECTIOUS A:  Recurrent PNA with suspected ARDS Leukocytosis Fever this am. Cultures (-) so far.   P:  Continue Vanc/cefepime Follow CXR Send for blood and sputum culture.    ENDOCRINE A:  hyperglycemia  P:  Continue SSI  NEUROLOGIC A:  Mentally impaired with limited vocabulary of 500 words.  Autism  Decreased LOC from metabolic disarray.  P:  RASS goal: -1 Requires sedative and paralytics Propofol dropped BP. On fantanyl and versed.>> too sedated for SBT now. Will start precedex and taper down versed.    Family updated at bedside.   Critical care time spent today : 30 minutes.       Pollie Meyer, MD Pulmonary and Critical Care Medicine [facility] Pager: [phone] After 3 pm or if no response, call [phone]  03/16/2015, 11:15 AM

## 2015-03-17 ENCOUNTER — Inpatient Hospital Stay (HOSPITAL_COMMUNITY): Payer: Medicare Other

## 2015-03-17 DIAGNOSIS — S27309S Unspecified injury of lung, unspecified, sequela: Secondary | ICD-10-CM

## 2015-03-17 LAB — CBC
HEMATOCRIT: 37.9 % — AB (ref 39.0–52.0)
Hemoglobin: 11.8 g/dL — ABNORMAL LOW (ref 13.0–17.0)
MCH: 26.3 pg (ref 26.0–34.0)
MCHC: 31.1 g/dL (ref 30.0–36.0)
MCV: 84.4 fL (ref 78.0–100.0)
PLATELETS: 111 10*3/uL — AB (ref 150–400)
RBC: 4.49 MIL/uL (ref 4.22–5.81)
RDW: 16.9 % — AB (ref 11.5–15.5)
WBC: 16.9 10*3/uL — AB (ref 4.0–10.5)

## 2015-03-17 LAB — BASIC METABOLIC PANEL
Anion gap: 8 (ref 5–15)
BUN: 46 mg/dL — AB (ref 6–20)
CHLORIDE: 124 mmol/L — AB (ref 101–111)
CO2: 28 mmol/L (ref 22–32)
CREATININE: 1.6 mg/dL — AB (ref 0.61–1.24)
Calcium: 8.6 mg/dL — ABNORMAL LOW (ref 8.9–10.3)
GFR calc Af Amer: >60 mL/min (ref >60)
GFR calc non Af Amer: 54 mL/min — ABNORMAL LOW (ref >60)
GLUCOSE: 162 mg/dL — AB (ref 65–99)
POTASSIUM: 4.5 mmol/L (ref 3.5–5.1)
Sodium: 160 mmol/L — ABNORMAL HIGH (ref 135–145)

## 2015-03-17 LAB — GLUCOSE, CAPILLARY
GLUCOSE-CAPILLARY: 152 mg/dL — AB (ref 65–99)
GLUCOSE-CAPILLARY: 137 mg/dL — AB (ref 65–99)
GLUCOSE-CAPILLARY: 192 mg/dL — AB (ref 65–99)
GLUCOSE-CAPILLARY: 142 mg/dL — AB (ref 65–99)
Glucose-Capillary: 161 mg/dL — ABNORMAL HIGH (ref 65–99)
Glucose-Capillary: 171 mg/dL — ABNORMAL HIGH (ref 65–99)

## 2015-03-17 LAB — SODIUM, URINE, RANDOM: SODIUM UR: 19 mmol/L

## 2015-03-17 LAB — OSMOLALITY, URINE: OSMOLALITY UR: 490 mosm/kg (ref 300–900)

## 2015-03-17 MED ORDER — FREE WATER
250.0000 mL | Freq: Four times a day (QID) | Status: DC
  Administered 2015-03-17 – 2015-03-28 (×40): 250 mL

## 2015-03-17 MED ORDER — MIDAZOLAM HCL 2 MG/2ML IJ SOLN
2.0000 mg | INTRAMUSCULAR | Status: DC | PRN
  Administered 2015-03-17 – 2015-03-22 (×16): 2 mg via INTRAVENOUS
  Filled 2015-03-17 (×17): qty 2

## 2015-03-17 MED ORDER — SODIUM CHLORIDE 0.9 % IV SOLN
1.0000 mg/h | INTRAVENOUS | Status: DC
  Administered 2015-03-17 – 2015-03-18 (×2): 2 mg/h via INTRAVENOUS
  Administered 2015-03-18: 3 mg/h via INTRAVENOUS
  Administered 2015-03-18: 2 mg/h via INTRAVENOUS
  Administered 2015-03-18: 4 mg/h via INTRAVENOUS
  Administered 2015-03-19: 1 mg/h via INTRAVENOUS
  Administered 2015-03-21: 6 mg/h via INTRAVENOUS
  Filled 2015-03-17 (×9): qty 10

## 2015-03-17 MED ORDER — HALOPERIDOL LACTATE 5 MG/ML IJ SOLN
5.0000 mg | Freq: Four times a day (QID) | INTRAMUSCULAR | Status: DC
  Administered 2015-03-17 – 2015-03-20 (×12): 5 mg via INTRAVENOUS
  Filled 2015-03-17 (×27): qty 1

## 2015-03-17 MED ORDER — INSULIN ASPART 100 UNIT/ML ~~LOC~~ SOLN
2.0000 [IU] | SUBCUTANEOUS | Status: DC
  Administered 2015-03-17 (×2): 2 [IU] via SUBCUTANEOUS
  Administered 2015-03-17: 4 [IU] via SUBCUTANEOUS
  Administered 2015-03-18 – 2015-03-21 (×12): 2 [IU] via SUBCUTANEOUS
  Administered 2015-03-21: 4 [IU] via SUBCUTANEOUS
  Administered 2015-03-21 (×2): 2 [IU] via SUBCUTANEOUS
  Administered 2015-03-22 (×2): 4 [IU] via SUBCUTANEOUS
  Administered 2015-03-23 – 2015-03-25 (×6): 2 [IU] via SUBCUTANEOUS
  Administered 2015-03-25: 4 [IU] via SUBCUTANEOUS
  Administered 2015-03-25: 2 [IU] via SUBCUTANEOUS
  Administered 2015-03-25: 4 [IU] via SUBCUTANEOUS
  Administered 2015-03-25: 2 [IU] via SUBCUTANEOUS
  Administered 2015-03-26: 4 [IU] via SUBCUTANEOUS
  Administered 2015-03-26: 6 [IU] via SUBCUTANEOUS
  Administered 2015-03-26 – 2015-03-27 (×9): 4 [IU] via SUBCUTANEOUS
  Administered 2015-03-28: 2 [IU] via SUBCUTANEOUS
  Administered 2015-03-28 (×2): 4 [IU] via SUBCUTANEOUS
  Administered 2015-03-28: 2 [IU] via SUBCUTANEOUS
  Administered 2015-03-29 (×2): 4 [IU] via SUBCUTANEOUS
  Administered 2015-03-29: 2 [IU] via SUBCUTANEOUS
  Administered 2015-03-29 – 2015-03-30 (×7): 4 [IU] via SUBCUTANEOUS
  Administered 2015-03-30 (×2): 2 [IU] via SUBCUTANEOUS
  Administered 2015-03-30 – 2015-03-31 (×2): 4 [IU] via SUBCUTANEOUS
  Administered 2015-03-31 – 2015-04-01 (×5): 2 [IU] via SUBCUTANEOUS
  Administered 2015-04-01: 4 [IU] via SUBCUTANEOUS
  Administered 2015-04-01 – 2015-04-02 (×3): 2 [IU] via SUBCUTANEOUS
  Administered 2015-04-02: 4 [IU] via SUBCUTANEOUS
  Administered 2015-04-02 – 2015-04-07 (×7): 2 [IU] via SUBCUTANEOUS

## 2015-03-17 NOTE — Progress Notes (Signed)
PULMONARY / CRITICAL CARE MEDICINE   Name: Juan Cameron MRN: [id number] DOB: 1975-01-29    ADMISSION DATE:  03/11/2015  CONSULTATION DATE: 2/28  REFERRING MD: Triad  CHIEF COMPLAINT: resp failure  HISTORY OF PRESENT ILLNESS:  65 autistic male recently in hospital for multilobar pneumonia and dc'd on 6 l [place] and returned in 24 hours with sats in 50%. He was discharged on 03/10/15 after treatment for CAP, NOI, and was on 6 l [place]. [facility] checked sats and he was 50% and therefore was transported back to [facility] and required 80% fio2 on NIVMS with RR 45. CXR consistent early ARDS, 2d echo with nl EF but grade 2 dysfunction. PCCM called to bedside on 3/1 with anticipation of requiring to be intubated.  SUBJECTIVE:  No issues overnight.  Did not tolerate wean.  VITAL SIGNS: BP 97/57 mmHg  Pulse 99  Temp(Src) 102.2 F (39 C) (Oral)  Resp 23  Ht  (1.702 m)  Wt 97.6 kg (215 lb 2.7 oz)  BMI 33.69 kg/m2  SpO2 93%  HEMODYNAMICS:    VENTILATOR SETTINGS: Vent Mode:  [-] PRVC FiO2 (%):  [35 %-40 %] 35 % Set Rate:  [20 bmp] 20 bmp Vt Set:  [460 mL] 460 mL PEEP:  [5 cmH20] 5 cmH20 Plateau Pressure:  [20 cmH20-24 cmH20] 22 cmH20  INTAKE / OUTPUT: I/O last 3 completed shifts: In: 4399.6 [I.V.:1419.6; NG/GT:2330; IV Piggyback:650] Out: 4635 [Urine:4635]  PHYSICAL EXAMINATION: General: Sedate, withdrawing to pain.  Neuro: Withdraws all ext to pain but not command. HEENT: [place]/AT, PERRL, EOM-I and MMM. Cardiovascular: RRR, Nl S1/S2, no MRG Lungs: Diminshed throughout. Abdomen: Obese , protrubent, hypoactive bowel sounds. Musculoskeletal: intact Skin: Warm, moist  LABS:  BMET  Recent Labs Lab 03/15/15 0455 03/16/15 1208 03/17/15 0421  NA 148* 153* 160*  K 5.2* 4.7 4.5  CL 114* 116* 124*  CO2 BUN 53* 36* 46*  CREATININE 1.64* 1.28* 1.60*  GLUCOSE 116* 149* 162*   Electrolytes  Recent Labs Lab 03/11/15 1702  03/15/15 0455 03/16/15 1208  03/17/15 0421  CALCIUM  --   < > 8.5* 8.4* 8.6*  MG 2.2  --   --   --   --   PHOS 3.9  --   --   --   --   < > = values in this interval not displayed.  CBC  Recent Labs Lab 03/15/15 0455 03/16/15 1208 03/17/15 0421  WBC 14.7* 18.7* 16.9*  HGB 12.0* 12.6* 11.8*  HCT 35.3* 38.5* 37.9*  PLT 103* 99* 111*   Coag's No results for input(s): APTT, INR in the last 168 hours.  Sepsis Markers  Recent Labs Lab 03/11/15 0925 03/11/15 0927 03/11/15 1222 03/13/15 0417  LATICACIDVEN  --  2.1* 1.6  --   PROCALCITON <0.10  --   --  0.16   ABG  Recent Labs Lab 03/12/15 1050 03/12/15 1314 03/13/15 1225  PHART 7.402 7.406 7.459*  PCO2ART 46.2* 48.0* 38.0  PO2ART 58.1* 56.0* 77.0*   Liver Enzymes  Recent Labs Lab 03/12/15 0434  AST 51*  ALT 25  ALKPHOS 44  BILITOT 0.6  ALBUMIN 2.9*    Cardiac Enzymes No results for input(s): TROPONINI, PROBNP in the last 168 hours.  Glucose  Recent Labs Lab 03/16/15 1208 03/16/15 1648 03/16/15 1945 03/16/15 2355 03/17/15 0325 03/17/15 0850  GLUCAP 129* 153* 168* 161* 171* 152*    Imaging Dg Chest Port 1 View  03/17/2015  CLINICAL DATA:  The evaluate ET tube placement EXAM: PORTABLE CHEST 1 VIEW COMPARISON:  03/16/2015 FINDINGS: ET tube tip is situated just above the carina. There is a left IJ catheter with tip in the projection of the SVC. The nasogastric tube tip is in the stomach. Decreased lung volumes. There is mild diffuse pulmonary edema. Airspace opacities within the lower lobes are again noted left greater than right. IMPRESSION: No change in aeration to the lungs compared with previous exam. Electronically Signed   By: Signa Kellaylor  Stroud M.D.   On: 03/17/2015 13:08   CULTURES: 2/28 bc x 2 negative 2/28 uc negative 2/28 RVP negative Sputum culture 3/4 >> Blood culture 3/4 >>  ANTIBIOTICS: 2/28 vanc>> 2/28 cefepime>>  SIGNIFICANT EVENTS: 2/28 readmit to hospital  03/12/15 Intubated 3/1Bronchoscopy    LINES/TUBES: Central line L IJ TLC>3/1  DISCUSSION: [age] autistic male recently in hospital for multiloba pan and dc'd on 6 l [place] and returned in 24 hours with sats in 50%. Was intubated on 3/1 on full vent support, with increased O2 demands, hemodynamically unstable requiring pressors.  ASSESSMENT / PLAN:  PULMONARY A: Acute hypoxic resp failure. Suspected recurrent pna/HCAP/ asp pna.  Early ARDS Suspected undiagnosed OSA  P:  On full vent.  FiO2 35% and PEEP to 5. PS when more awake. Abx as below.  CARDIOVASCULAR A:  Septic Shock -- better.  P:  Off pressors. May restart for MAP of 65. Tachycardic 2 to fever.   RENAL A:  Acute Kidney Injury Rising Na. P:  Trend BUN and creatininine Creat stable.  Free water 250 ml q6 hours. Check urine osmolality and specific gravity, Na and Cr.  GASTROINTESTINAL A:  No active issues P:  Cont TF  HEMATOLOGIC A:  Hx of sickle cell trait VTE  P:  Continue enoxaparin  INFECTIOUS A:  Recurrent PNA with suspected ARDS Leukocytosis Fever this am. Cultures (-) so far.  P:  Continue Vanc/cefepime Follow CXR Send for blood and sputum culture.   ENDOCRINE A:  hyperglycemia  P:  ISS ordered moderate  NEUROLOGIC A:  Mentally impaired with limited vocabulary of 500 words.  Autism  Decreased LOC from metabolic disarray.  P:  RASS goal: -1 Requires sedative and paralytics On fantanyl drip and versed PRN Precedex drip.  Family updated at bedside.   The patient is critically ill with multiple organ systems failure and requires high complexity decision making for assessment and support, frequent evaluation and titration of therapies, application of advanced monitoring technologies and extensive interpretation of multiple databases.   Critical Care Time devoted to patient care services described in this note is  35  Minutes. This time reflects time of care of this signee Dr Koren BoundWesam  Yacoub. This critical care time does not reflect procedure time, or teaching time or supervisory time of PA/NP/Med student/Med Resident etc but could involve care discussion time.  Alyson ReedyWesam G. Yacoub, M.D. [facility] Pulmonary/Critical Care Medicine. Pager: [phone]. After hours pager: [phone].  03/17/2015, 2:26 PM

## 2015-03-17 NOTE — Progress Notes (Signed)
eLink Physician-Brief Progress Note Patient Name: Alan RipperRobert Kirley DOB: [birth year] MRN: [id number]   Date of Service  03/17/2015  HPI/Events of Note  Notified by bedside nurse of the patient's ongoing agitation despite repeated Versed bolus & Precedex drip at 1.2. Patient currently on fentanyl drip. Currently continuing to require low-dose vasopressor support. Known underlying autism. Patient laying in bed  Awake and alert but moving about intermittently.  eICU Interventions  1. *Versed drip to maintain goal RASS 2. Checking EKG to evaluate QTC for possible Haldol or Seroquel. 3. Plan to wean Precedex drip for patient comfort as first set is initiated.     Intervention Category Intermediate Interventions: Other:  Lawanda CousinsJennings Murray Durrell 03/17/2015, 5:19 PM

## 2015-03-17 NOTE — Progress Notes (Signed)
UR Completed. Gearld Kerstein, RN, BSN.  [phone] 

## 2015-03-17 NOTE — Progress Notes (Signed)
eLink Physician-Brief Progress Note Patient Name: Juan RipperRobert Moga DOB: [birth year] MRN: [id number]   Date of Service  03/17/2015  HPI/Events of Note  EKG shows a QTC of 391 ms. History of allergy only to peanuts.  eICU Interventions  1. Start Haldol 5 mg IV every 6 hours 2. EKG in a.m. To monitor QTc     Intervention Category Intermediate Interventions: Change in mental status - evaluation and management  Lawanda CousinsJennings Deronda Christian 03/17/2015, 5:43 PM

## 2015-03-17 NOTE — Progress Notes (Signed)
eLink Physician-Brief Progress Note Patient Name: Juan RipperRobert Cameron DOB: April 25, 1975 MRN: [id number]   Date of Service  03/17/2015  HPI/Events of Note  Agitated - Currently on maximal dose of Precedex IV infusion.   eICU Interventions  Will add Versed 2 mg IV Q 1 hour PRN agitation.      Intervention Category Minor Interventions: Agitation / anxiety - evaluation and management  [name],Steven Eugene 03/17/2015, 3:31 AM

## 2015-03-18 ENCOUNTER — Inpatient Hospital Stay (HOSPITAL_COMMUNITY): Payer: Medicare Other

## 2015-03-18 DIAGNOSIS — R0902 Hypoxemia: Secondary | ICD-10-CM

## 2015-03-18 LAB — POCT I-STAT 3, ART BLOOD GAS (G3+)
Acid-Base Excess: 1 mmol/L (ref 0.0–2.0)
BICARBONATE: 27.3 meq/L — AB (ref 20.0–24.0)
O2 SAT: 96 %
PCO2 ART: 52.3 mmHg — AB (ref 35.0–45.0)
TCO2: 29 mmol/L (ref 0–100)
pH, Arterial: 7.326 — ABNORMAL LOW (ref 7.350–7.450)
pO2, Arterial: 88 mmHg (ref 80.0–100.0)

## 2015-03-18 LAB — BASIC METABOLIC PANEL
ANION GAP: 10 (ref 5–15)
BUN: 47 mg/dL — ABNORMAL HIGH (ref 6–20)
CALCIUM: 8.5 mg/dL — AB (ref 8.9–10.3)
CO2: 26 mmol/L (ref 22–32)
CREATININE: 1.83 mg/dL — AB (ref 0.61–1.24)
Chloride: 124 mmol/L — ABNORMAL HIGH (ref 101–111)
GFR, EST AFRICAN AMERICAN: 53 mL/min — AB (ref >60)
GFR, EST NON AFRICAN AMERICAN: 46 mL/min — AB (ref >60)
Glucose, Bld: 147 mg/dL — ABNORMAL HIGH (ref 65–99)
Potassium: 4.4 mmol/L (ref 3.5–5.1)
SODIUM: 160 mmol/L — AB (ref 135–145)

## 2015-03-18 LAB — GLUCOSE, CAPILLARY
GLUCOSE-CAPILLARY: 106 mg/dL — AB (ref 65–99)
GLUCOSE-CAPILLARY: 118 mg/dL — AB (ref 65–99)
GLUCOSE-CAPILLARY: 136 mg/dL — AB (ref 65–99)
GLUCOSE-CAPILLARY: 136 mg/dL — AB (ref 65–99)
Glucose-Capillary: 146 mg/dL — ABNORMAL HIGH (ref 65–99)
Glucose-Capillary: 137 mg/dL — ABNORMAL HIGH (ref 65–99)

## 2015-03-18 LAB — CULTURE, RESPIRATORY W GRAM STAIN

## 2015-03-18 LAB — CBC
HCT: 37.5 % — ABNORMAL LOW (ref 39.0–52.0)
Hemoglobin: 11.7 g/dL — ABNORMAL LOW (ref 13.0–17.0)
MCH: 26.4 pg (ref 26.0–34.0)
MCHC: 31.2 g/dL (ref 30.0–36.0)
MCV: 84.7 fL (ref 78.0–100.0)
PLATELETS: 119 10*3/uL — AB (ref 150–400)
RBC: 4.43 MIL/uL (ref 4.22–5.81)
RDW: 17.1 % — AB (ref 11.5–15.5)
WBC: 13.9 10*3/uL — AB (ref 4.0–10.5)

## 2015-03-18 LAB — CULTURE, RESPIRATORY: SPECIAL REQUESTS: NORMAL

## 2015-03-18 LAB — MAGNESIUM: Magnesium: 3 mg/dL — ABNORMAL HIGH (ref 1.7–2.4)

## 2015-03-18 LAB — PHOSPHORUS: PHOSPHORUS: 4.3 mg/dL (ref 2.5–4.6)

## 2015-03-18 MED ORDER — DEXTROSE 5 % IV SOLN
INTRAVENOUS | Status: DC
  Administered 2015-03-18 – 2015-03-20 (×4): via INTRAVENOUS

## 2015-03-18 NOTE — Care Management Important Message (Signed)
Important Message  Patient Details  Name: Juan Cameron MRN: [id number] Date of Birth: 08/08/1975   Medicare Important Message Given:  Yes    Bernadette HoitShoffner, Hanan Moen Coleman 03/18/2015, 8:24 AM

## 2015-03-18 NOTE — Progress Notes (Signed)
RT attempted to wean patient on ventilator. Patient did not tolerate well due to decrease in 02 saturations and increase in heart rate. MD and RN aware. RT will continue to monitor.

## 2015-03-18 NOTE — Progress Notes (Signed)
PULMONARY / CRITICAL CARE MEDICINE   Name: Juan RipperRobert Cameron MRN: [id number] DOB: 1975-04-13    ADMISSION DATE:  03/11/2015  CONSULTATION DATE: 2/28  REFERRING MD: Triad  CHIEF COMPLAINT: resp failure  HISTORY OF PRESENT ILLNESS:  [age] autistic male recently in hospital for multilobar pneumonia and dc'd on 6 l [place] and returned in 24 hours with sats in 50%. He was discharged on 03/10/15 after treatment for CAP, NOI, and was on 6 l [place]. Sister [facility](HCPOA) checked sats and he was 50% and therefore was transported back to [facility] and required 80% fio2 on NIVMS with RR 45. CXR consistent early ARDS, 2d echo with nl EF but grade 2 dysfunction. PCCM called to bedside on 3/1 with anticipation of requiring to be intubated.  SUBJECTIVE:  No issues overnight.  Did not tolerate wean.  Not following commands.  VITAL SIGNS: BP 142/123 mmHg  Pulse 134  Temp(Src) 97.4 F (36.3 C) (Oral)  Resp 24  Ht 5\' 7"  (1.702 m)  Wt 96.8 kg (213 lb 6.5 oz)  BMI 33.42 kg/m2  SpO2 96%  HEMODYNAMICS:    VENTILATOR SETTINGS: Vent Mode:  [-] PRVC FiO2 (%):  [35 %] 35 % Set Rate:  [20 bmp] 20 bmp Vt Set:  [460 mL] 460 mL PEEP:  [5 cmH20] 5 cmH20 Plateau Pressure:  [20 cmH20-25 cmH20] 25 cmH20  INTAKE / OUTPUT: I/O last 3 completed shifts: In: 4726.3 [I.V.:1391.3; Other:120; NG/GT:2365; IV Piggyback:850] Out: 3940 [Urine:3940]  PHYSICAL EXAMINATION: General: Sedate, withdrawing to pain.  Neuro: Withdraws all ext to pain but not command. HEENT: [place]/AT, PERRL, EOM-I and MMM. Cardiovascular: RRR, Nl S1/S2, no MRG Lungs: Diminshed throughout. Abdomen: Obese , protrubent, hypoactive bowel sounds. Musculoskeletal: intact Skin: Warm, moist  LABS:  BMET  Recent Labs Lab 03/16/15 1208 03/17/15 0421 03/18/15 0400  NA 153* 160* 160*  K 4.7 4.5 4.4  CL 116* 124* 124*  CO2 29 28 26   BUN 36* 46* 47*  CREATININE 1.28* 1.60* 1.83*  GLUCOSE 149* 162* 147*   Electrolytes  Recent Labs Lab 03/11/15 1702   03/16/15 1208 03/17/15 0421 03/18/15 0400  CALCIUM  --   < > 8.4* 8.6* 8.5*  MG 2.2  --   --   --  3.0*  PHOS 3.9  --   --   --  4.3  < > = values in this interval not displayed.  CBC  Recent Labs Lab 03/16/15 1208 03/17/15 0421 03/18/15 0400  WBC 18.7* 16.9* 13.9*  HGB 12.6* 11.8* 11.7*  HCT 38.5* 37.9* 37.5*  PLT 99* 111* 119*   Coag's No results for input(s): APTT, INR in the last 168 hours.  Sepsis Markers  Recent Labs Lab 03/13/15 0417  PROCALCITON 0.16   ABG  Recent Labs Lab 03/12/15 1314 03/13/15 1225 03/18/15 0314  PHART 7.406 7.459* 7.326*  PCO2ART 48.0* 38.0 52.3*  PO2ART 56.0* 77.0* 88.0   Liver Enzymes  Recent Labs Lab 03/12/15 0434  AST 51*  ALT 25  ALKPHOS 44  BILITOT 0.6  ALBUMIN 2.9*    Cardiac Enzymes No results for input(s): TROPONINI, PROBNP in the last 168 hours.  Glucose  Recent Labs Lab 03/17/15 1510 03/17/15 2000 03/17/15 2351 03/18/15 0357 03/18/15 0733 03/18/15 1131  GLUCAP 192* 142* 136* 146* 137* 106*    Imaging Dg Chest Port 1 View  03/18/2015  CLINICAL DATA:  Acute respiratory failure, healthcare associated pneumonia, CHF, ARDS, intubated patient. EXAM: PORTABLE CHEST 1 VIEW COMPARISON:  Portable chest x-ray of March 17, 2015 FINDINGS: The lung volumes remain low. The pulmonary interstitial markings have improved somewhat. There remains partial obscuration of the hemidiaphragms due to bibasilar atelectasis and a small left-sided pleural effusion. The cardiac silhouette is top-normal in size. The pulmonary vascularity is less engorged. The endotracheal tube tip lies approximately 2.2 cm above the carina. The esophagogastric tube tip projects below the inferior margin of the image. The left internal jugular venous catheter tip projects at the level of the junction of the right and left brachiocephalic veins as they form the SVC. IMPRESSION: Slight interval improvement in the pulmonary interstitium suggests decreasing  interstitial edema. Persistent bibasilar atelectasis or pneumonia. The support tubes are in reasonable position. Electronically Signed   By: David  [place] M.D.   On: 03/18/2015 07:22   CULTURES: 2/28 bc x 2 negative 2/28 uc negative 2/28 RVP negative Sputum culture 3/4 >> Blood culture 3/4 >>  ANTIBIOTICS: 2/28 vanc>> 2/28 cefepime>>  SIGNIFICANT EVENTS: 2/28 readmit to hospital  03/12/15 Intubated 3/1Bronchoscopy   LINES/TUBES: Central line L IJ TLC>3/1  DISCUSSION: 38 autistic male recently in hospital for multiloba pan and dc'd on 6 l [place] and returned in 24 hours with sats in 50%. Was intubated on 3/1 on full vent support, with increased O2 demands, hemodynamically unstable requiring pressors.  ASSESSMENT / PLAN:  PULMONARY A: Acute hypoxic resp failure. Suspected recurrent pna/HCAP/ asp pna.  Early ARDS Suspected undiagnosed OSA  P:  On full vent.  FiO2 35% and PEEP to 5. PS when more awake. Abx as below. Spoke with sister, they are discussing trach/peg options.  CARDIOVASCULAR A:  Septic Shock -- better.  P:  Off pressors. Tachycardic 2 to fever and agitation.   RENAL A:  Acute Kidney Injury Elevated but stable Na. P:  Trend BUN and creatininine Creat stable.  Free water 250 ml q6 hours. Add D5W at 50 ml/hr.  GASTROINTESTINAL A:  No active issues P:  Cont TF  HEMATOLOGIC A:  Hx of sickle cell trait VTE  P:  Continue enoxaparin  INFECTIOUS A:  Recurrent PNA with suspected ARDS Leukocytosis Fever this am. Cultures (-) so far.  P:  Continue Vanc/cefepime Follow CXR Cultures negative but moderate yeast in sputum.  ENDOCRINE A:  hyperglycemia  P:  ISS ordered moderate  NEUROLOGIC A:  Mentally impaired with limited vocabulary of 500 words.  Autism  Decreased LOC from metabolic disarray.  P:  RASS goal: -1 Requires sedative and paralytics On fantanyl drip and versed PRN Precedex  drip.  Spoke with sister regarding trach/peg with continuous failure to wean.  They are to discuss this amongst themselves then to meet with me in AM for plan of care.  The patient is critically ill with multiple organ systems failure and requires high complexity decision making for assessment and support, frequent evaluation and titration of therapies, application of advanced monitoring technologies and extensive interpretation of multiple databases.   Critical Care Time devoted to patient care services described in this note is  35  Minutes. This time reflects time of care of this signee Dr Koren Bound. This critical care time does not reflect procedure time, or teaching time or supervisory time of PA/NP/Med student/Med Resident etc but could involve care discussion time.  Alyson Reedy, M.D. [facility] Pulmonary/Critical Care Medicine. Pager: [phone]. After hours pager: [phone].  03/18/2015, 1:41 PM

## 2015-03-19 ENCOUNTER — Inpatient Hospital Stay (HOSPITAL_COMMUNITY): Payer: Medicare Other

## 2015-03-19 DIAGNOSIS — D72829 Elevated white blood cell count, unspecified: Secondary | ICD-10-CM

## 2015-03-19 LAB — BASIC METABOLIC PANEL
ANION GAP: 5 (ref 5–15)
BUN: 55 mg/dL — AB (ref 6–20)
CO2: 27 mmol/L (ref 22–32)
Calcium: 8.1 mg/dL — ABNORMAL LOW (ref 8.9–10.3)
Chloride: 124 mmol/L — ABNORMAL HIGH (ref 101–111)
Creatinine, Ser: 2.38 mg/dL — ABNORMAL HIGH (ref 0.61–1.24)
GFR calc Af Amer: 38 mL/min — ABNORMAL LOW (ref >60)
GFR, EST NON AFRICAN AMERICAN: 33 mL/min — AB (ref >60)
GLUCOSE: 124 mg/dL — AB (ref 65–99)
POTASSIUM: 4.6 mmol/L (ref 3.5–5.1)
Sodium: 156 mmol/L — ABNORMAL HIGH (ref 135–145)

## 2015-03-19 LAB — BLOOD GAS, ARTERIAL
ACID-BASE EXCESS: 1.1 mmol/L (ref 0.0–2.0)
BICARBONATE: 25.9 meq/L — AB (ref 20.0–24.0)
Drawn by: 44135
FIO2: 0.4
LHR: 20 {breaths}/min
O2 SAT: 95.7 %
PATIENT TEMPERATURE: 98.6
PEEP/CPAP: 5 cmH2O
PH ART: 7.361 (ref 7.350–7.450)
TCO2: 27.3 mmol/L (ref 0–100)
VT: 460 mL
pCO2 arterial: 47 mmHg — ABNORMAL HIGH (ref 35.0–45.0)
pO2, Arterial: 81 mmHg (ref 80.0–100.0)

## 2015-03-19 LAB — CBC
HEMATOCRIT: 37 % — AB (ref 39.0–52.0)
Hemoglobin: 11.5 g/dL — ABNORMAL LOW (ref 13.0–17.0)
MCH: 26.4 pg (ref 26.0–34.0)
MCHC: 31.1 g/dL (ref 30.0–36.0)
MCV: 84.9 fL (ref 78.0–100.0)
PLATELETS: 110 10*3/uL — AB (ref 150–400)
RBC: 4.36 MIL/uL (ref 4.22–5.81)
RDW: 17.2 % — ABNORMAL HIGH (ref 11.5–15.5)
WBC: 14.3 10*3/uL — ABNORMAL HIGH (ref 4.0–10.5)

## 2015-03-19 LAB — MAGNESIUM: Magnesium: 2.7 mg/dL — ABNORMAL HIGH (ref 1.7–2.4)

## 2015-03-19 LAB — VANCOMYCIN, TROUGH: Vancomycin Tr: 32 ug/mL (ref 10.0–20.0)

## 2015-03-19 LAB — GLUCOSE, CAPILLARY
GLUCOSE-CAPILLARY: 126 mg/dL — AB (ref 65–99)
GLUCOSE-CAPILLARY: 122 mg/dL — AB (ref 65–99)
Glucose-Capillary: 104 mg/dL — ABNORMAL HIGH (ref 65–99)
Glucose-Capillary: 130 mg/dL — ABNORMAL HIGH (ref 65–99)

## 2015-03-19 LAB — PHOSPHORUS: Phosphorus: 4.1 mg/dL (ref 2.5–4.6)

## 2015-03-19 MED ORDER — VECURONIUM BROMIDE 10 MG IV SOLR
10.0000 mg | Freq: Once | INTRAVENOUS | Status: AC
  Administered 2015-03-20: 10 mg via INTRAVENOUS

## 2015-03-19 MED ORDER — SENNOSIDES 8.8 MG/5ML PO SYRP
5.0000 mL | ORAL_SOLUTION | Freq: Once | ORAL | Status: AC
  Administered 2015-03-19: 5 mL via ORAL
  Filled 2015-03-19: qty 5

## 2015-03-19 MED ORDER — MIDAZOLAM HCL 2 MG/2ML IJ SOLN: 4.0000 mg | Freq: Once | INTRAMUSCULAR | Status: DC

## 2015-03-19 MED ORDER — ETOMIDATE 2 MG/ML IV SOLN
40.0000 mg | Freq: Once | INTRAVENOUS | Status: AC
  Administered 2015-03-20: 20 mg via INTRAVENOUS
  Filled 2015-03-19: qty 20

## 2015-03-19 MED ORDER — BISACODYL 10 MG RE SUPP
10.0000 mg | Freq: Once | RECTAL | Status: AC
  Administered 2015-03-19: 10 mg via RECTAL
  Filled 2015-03-19: qty 1

## 2015-03-19 MED ORDER — PROPOFOL 500 MG/50ML IV EMUL
5.0000 ug/kg/min | Freq: Once | INTRAVENOUS | Status: DC
  Filled 2015-03-19: qty 50

## 2015-03-19 MED ORDER — VANCOMYCIN HCL IN DEXTROSE 750-5 MG/150ML-% IV SOLN
750.0000 mg | INTRAVENOUS | Status: DC
  Administered 2015-03-20: 750 mg via INTRAVENOUS
  Filled 2015-03-19 (×2): qty 150

## 2015-03-19 MED ORDER — FENTANYL CITRATE (PF) 100 MCG/2ML IJ SOLN: 200.0000 ug | Freq: Once | INTRAMUSCULAR | Status: DC

## 2015-03-19 NOTE — Progress Notes (Signed)
PULMONARY / CRITICAL CARE MEDICINE   Name: Juan Cameron MRN: [id number] DOB: 31-Dec-1975    ADMISSION DATE:  03/11/2015  CONSULTATION DATE: 2/28  REFERRING MD: Triad  CHIEF COMPLAINT: resp failure  HISTORY OF PRESENT ILLNESS:  63 autistic male recently in hospital for multilobar pneumonia and dc'd on 6 l [place] and returned in 24 hours with sats in 50%. He was discharged on 03/10/15 after treatment for CAP, NOI, and was on 6 l [place]. Sister [facility]) checked sats and he was 50% and therefore was transported back to [facility] and required 80% fio2 on NIVMS with RR 45. CXR consistent early ARDS, 2d echo with nl EF but grade 2 dysfunction. PCCM called to bedside on 3/1 with anticipation of requiring to be intubated.  SUBJECTIVE:    Not following commands.  VITAL SIGNS: BP 123/60 mmHg  Pulse 127  Temp(Src) 99.6 F (37.6 C) (Oral)  Resp 19  Ht  (1.702 m)  Wt 220 lb 10.9 oz (100.1 kg)  BMI 34.56 kg/m2  SpO2 95%  HEMODYNAMICS:    VENTILATOR SETTINGS: Vent Mode:  [-] PRVC FiO2 (%):  [35 %-40 %] 40 % Set Rate:  [20 bmp] 20 bmp Vt Set:  [460 mL] 460 mL PEEP:  [5 cmH20] 5 cmH20 Plateau Pressure:  [24 cmH20-26 cmH20] 24 cmH20  INTAKE / OUTPUT: I/O last 3 completed shifts: In: 6156.3 [I.V.:2426.3; Other:140; NG/GT:2990; IV Piggyback:600] Out: 3665 [Urine:3665]  PHYSICAL EXAMINATION: General: Sedate, withdrawing to pain. Moves all ext Neuro: Withdraws all ext to pain but not command. HEENT: [place]/AT, PERRL, EOM-I and MMM. Cardiovascular: RRR, Nl S1/S2, no MRG Lungs: Diminshed throughout. Abdomen: Obese , protrubent, hypoactive bowel sounds. Musculoskeletal: intact Skin: Warm, moist  LABS:  BMET  Recent Labs Lab 03/17/15 0421 03/18/15 0400 03/19/15 0420  NA 160* 160* 156*  K 4.5 4.4 4.6  CL 124* 124* 124*  CO2 BUN 46* 47* 55*  CREATININE 1.60* 1.83* 2.38*  GLUCOSE 162* 147* 124*   Electrolytes  Recent Labs Lab 03/17/15 0421 03/18/15 0400  03/19/15 0420  CALCIUM 8.6* 8.5* 8.1*  MG  --  3.0* 2.7*  PHOS  --  4.3 4.1    CBC  Recent Labs Lab 03/17/15 0421 03/18/15 0400 03/19/15 0420  WBC 16.9* 13.9* 14.3*  HGB 11.8* 11.7* 11.5*  HCT 37.9* 37.5* 37.0*  PLT 111* 119* 110*   Coag's No results for input(s): APTT, INR in the last 168 hours.  Sepsis Markers  Recent Labs Lab 03/13/15 0417  PROCALCITON 0.16   ABG  Recent Labs Lab 03/13/15 1225 03/18/15 0314 03/19/15 0420  PHART 7.459* 7.326* 7.361  PCO2ART 38.0 52.3* 47.0*  PO2ART 77.0* 88.0 81.0   Liver Enzymes No results for input(s): AST, ALT, ALKPHOS, BILITOT, ALBUMIN in the last 168 hours.  Cardiac Enzymes No results for input(s): TROPONINI, PROBNP in the last 168 hours.  Glucose  Recent Labs Lab 03/18/15 0733 03/18/15 1131 03/18/15 1539 03/18/15 2029 03/18/15 2326 03/19/15 0431  GLUCAP 137* 106* 136* 118* 126* 104*    Imaging Dg Chest Port 1 View  03/19/2015  CLINICAL DATA:  Shortness of breath. EXAM: PORTABLE CHEST 1 VIEW COMPARISON:  03/18/2015. FINDINGS: Endotracheal tube, NG tube, left IJ line in stable position. Stable cardiomegaly. Low lung volumes with bibasilar atelectasis and/or infiltrates. Persistent low lung volumes with bibasilar atelectasis and infiltrates. Small left pleural effusion cannot be excluded. Lung markings in the upper most aspect of the pulmonary apices are not identified no definite pneumothorax  noted. Continued surveillance suggested. IMPRESSION: 1. Lines and tubes in stable position. 2. Low lung volumes with persistent bibasilar atelectasis and infiltrates. Small left pleural effusion cannot be excluded. 3. Lung markings in the upper most aspect the pulmonary apices are not identified, no definite pneumothorax noted. Continued surveillance suggested . Electronically Signed   By: Maisie Fushomas  Register   On: 03/19/2015 07:29   CULTURES: 2/28 bc x 2 negative 2/28 uc negative 2/28 RVP negative Sputum culture 3/4  >>yeast Blood culture 3/4 >>  ANTIBIOTICS: 2/28 vanc>> 2/28 cefepime>>  SIGNIFICANT EVENTS: 2/28 readmit to hospital  03/12/15 Intubated 3/1Bronchoscopy   LINES/TUBES: Central line L IJ TLC>3/1  DISCUSSION: [age] autistic male recently in hospital for multiloba pan and dc'd on 6 l [place] and returned in 24 hours with sats in 50%. Was intubated on 3/1 on full vent support, with increased O2 demands, hemodynamically unstable requiring pressors.  ASSESSMENT / PLAN:  PULMONARY A: Acute hypoxic resp failure. Suspected recurrent pna/HCAP/ asp pna.  Early ARDS Suspected undiagnosed OSA  P:  On full vent.  FiO2 35% and PEEP to 5. PS when more awake. Abx as below. Spoke with sister, they are discussing trach/peg options.  CARDIOVASCULAR A:  Septic Shock -- improved.  P:  Off pressors. Tachycardic 2 to fever and agitation.   RENAL Lab Results  Component Value Date   CREATININE 2.38* 03/19/2015   CREATININE 1.83* 03/18/2015   CREATININE 1.60* 03/17/2015    A:  Acute Kidney Injury Elevated but stable Na. P:  Trend BUN and creatininine Creat stable.  Free water 250 ml q6 hours. Add D5W at 50 ml/hr.  GASTROINTESTINAL A:  No active issues P:  Cont TF  HEMATOLOGIC A:  Hx of sickle cell trait VTE  P:  Continue enoxaparin  INFECTIOUS A:  Recurrent PNA with suspected ARDS Leukocytosis Fever this am. Cultures (-) so far.  P:  Continue Vanc/cefepime Follow CXR Cultures negative but moderate yeast in sputum.  ENDOCRINE A:  hyperglycemia  P:  ISS ordered moderate  NEUROLOGIC A:  Mentally impaired with limited vocabulary of 500 words.  Autism  Decreased LOC from metabolic disarray.  P:  RASS goal: -1 Requires sedatives On fantanyl drip and versed PRN Precedex drip.  Spoke with sister regarding trach/peg with continuous failure to wean.  They are to discuss this amongst themselves then to meet with me in AM for plan  of care.  3/8 spoke at length with sister  [name]([name]) about tracheostomy. She is considering it and most likely will go trach route.  Brett CanalesSteve Minor ACNP [facility] PCCM Pager [phone] till 3 pm If no answer page [phone] 03/19/2015, 11:51 AM  Attending Note:  40 year old with autism and very poor functional status whom we have been unable to wean due to severe agitation.  Hold further weaning trials for now.  On exam, lungs coarse bilaterally, patient is either sedate or very agitated.  Spoke with sister face to face and god mother over the phone.  After discussion, decision was made to place tracheostomy in the patient.  They are fully aware of the risk that he takes his own trach out and the associated danger.  Will plan on trach between 12-1 PM on 3/9.  Sister to sign consent.  The patient is critically ill with multiple organ systems failure and requires high complexity decision making for assessment and support, frequent evaluation and titration of therapies, application of advanced monitoring technologies and extensive interpretation of multiple databases.  Critical Care Time devoted to patient care services described in this note is  35  Minutes. This time reflects time of care of this signee Dr Jennet Maduro. This critical care time does not reflect procedure time, or teaching time or supervisory time of PA/NP/Med student/Med Resident etc but could involve care discussion time.  Rush Farmer, M.D. [facility] Pulmonary/Critical Care Medicine. Pager: [phone]. After hours pager: [phone].

## 2015-03-19 NOTE — Progress Notes (Signed)
Pharmacy Antibiotic Note  40 YO Male with VDRF/PNA for Vancomycin.  Vancomycin trough tonight elevated Plan: Change Vancomycin 750 mg IV q24h F/U renal function  Height: 5\' 7"  (170.2 cm) Weight: 213 lb 6.5 oz (96.8 kg) IBW/kg (Calculated) : 66.1  Temp (24hrs), Avg:100.3 F (37.9 C), Min:97.4 F (36.3 C), Max:102 F (38.9 C)   Recent Labs Lab 03/12/15 0434  03/12/15 1806  03/14/15 0420 03/15/15 0455 03/16/15 1208 03/17/15 0421 03/18/15 0400 03/19/15 0120  WBC 27.6*  --   --   --   --  14.7* 18.7* 16.9* 13.9*  --   CREATININE 1.23  < >  --   < > 1.36* 1.64* 1.28* 1.60* 1.83*  --   VANCOTROUGH  --   --  27*  --   --   --   --   --   --  32*  < > = values in this interval not displayed.  Estimated Creatinine Clearance: 61.3 mL/min (by C-G formula based on Cr of 1.83).    Allergies  Allergen Reactions  . Peanuts [Peanut Oil] Anaphylaxis    Antimicrobials this admission: Cefepime 2/28 >> (3/7) Vanc 2/28 >> 3/4, resumed 3/5 >>  Dose adjustments this admission: 3/1 VT = 27 on 1g q8 >> reduced to 750 q12 (SCr 1.72)  Microbiology results: 2/28 urine cx - negative 2/28 blood cx - NGTD 2/28 RVP - negative 2/28 Flu PCR -negative 2/28 Strep UA -negative 2/28 Legionella -negative 2/28 MRSA PCR -negative  Geannie RisenGreg Darrold Bezek, PharmD, BCPS  03/19/2015 2:24 AM

## 2015-03-20 ENCOUNTER — Inpatient Hospital Stay (HOSPITAL_COMMUNITY): Payer: Medicare Other

## 2015-03-20 LAB — BASIC METABOLIC PANEL
Anion gap: 12 (ref 5–15)
BUN: 57 mg/dL — ABNORMAL HIGH (ref 6–20)
CHLORIDE: 118 mmol/L — AB (ref 101–111)
CO2: 24 mmol/L (ref 22–32)
CREATININE: 2.76 mg/dL — AB (ref 0.61–1.24)
Calcium: 8.2 mg/dL — ABNORMAL LOW (ref 8.9–10.3)
GFR calc non Af Amer: 28 mL/min — ABNORMAL LOW (ref >60)
GFR, EST AFRICAN AMERICAN: 32 mL/min — AB (ref >60)
Glucose, Bld: 125 mg/dL — ABNORMAL HIGH (ref 65–99)
Potassium: 3.9 mmol/L (ref 3.5–5.1)
Sodium: 154 mmol/L — ABNORMAL HIGH (ref 135–145)

## 2015-03-20 LAB — BLOOD GAS, ARTERIAL
ACID-BASE EXCESS: 0.9 mmol/L (ref 0.0–2.0)
Bicarbonate: 25.3 mEq/L — ABNORMAL HIGH (ref 20.0–24.0)
DRAWN BY: 252031
FIO2: 0.5
MECHVT: 460 mL
O2 Saturation: 96.8 %
PEEP: 5 cmH2O
Patient temperature: 98.6
RATE: 20 resp/min
TCO2: 26.6 mmol/L (ref 0–100)
pCO2 arterial: 42.5 mmHg (ref 35.0–45.0)
pH, Arterial: 7.392 (ref 7.350–7.450)
pO2, Arterial: 86.8 mmHg (ref 80.0–100.0)

## 2015-03-20 LAB — APTT: APTT: 33 s (ref 24–37)

## 2015-03-20 LAB — CULTURE, BLOOD (ROUTINE X 2)
Culture: NO GROWTH
Culture: NO GROWTH

## 2015-03-20 LAB — GLUCOSE, CAPILLARY
GLUCOSE-CAPILLARY: 121 mg/dL — AB (ref 65–99)
GLUCOSE-CAPILLARY: 109 mg/dL — AB (ref 65–99)
GLUCOSE-CAPILLARY: 110 mg/dL — AB (ref 65–99)
GLUCOSE-CAPILLARY: 124 mg/dL — AB (ref 65–99)
GLUCOSE-CAPILLARY: 143 mg/dL — AB (ref 65–99)
Glucose-Capillary: 121 mg/dL — ABNORMAL HIGH (ref 65–99)

## 2015-03-20 LAB — CBC
HEMATOCRIT: 31.8 % — AB (ref 39.0–52.0)
HEMOGLOBIN: 10.2 g/dL — AB (ref 13.0–17.0)
MCH: 27.1 pg (ref 26.0–34.0)
MCHC: 32.1 g/dL (ref 30.0–36.0)
MCV: 84.6 fL (ref 78.0–100.0)
Platelets: 100 10*3/uL — ABNORMAL LOW (ref 150–400)
RBC: 3.76 MIL/uL — ABNORMAL LOW (ref 4.22–5.81)
RDW: 17.1 % — ABNORMAL HIGH (ref 11.5–15.5)
WBC: 13.7 10*3/uL — ABNORMAL HIGH (ref 4.0–10.5)

## 2015-03-20 LAB — PROTIME-INR
INR: 1.37 (ref 0.00–1.49)
Prothrombin Time: 17 seconds — ABNORMAL HIGH (ref 11.6–15.2)

## 2015-03-20 LAB — MAGNESIUM: Magnesium: 2.6 mg/dL — ABNORMAL HIGH (ref 1.7–2.4)

## 2015-03-20 LAB — PHOSPHORUS: PHOSPHORUS: 3.4 mg/dL (ref 2.5–4.6)

## 2015-03-20 MED ORDER — HEPARIN SODIUM (PORCINE) 5000 UNIT/ML IJ SOLN
5000.0000 [IU] | Freq: Three times a day (TID) | INTRAMUSCULAR | Status: DC
  Administered 2015-03-20 – 2015-04-07 (×54): 5000 [IU] via SUBCUTANEOUS
  Filled 2015-03-20 (×61): qty 1

## 2015-03-20 MED ORDER — DEXTROSE 5 % IV SOLN
2.0000 g | Freq: Two times a day (BID) | INTRAVENOUS | Status: DC
  Administered 2015-03-20 – 2015-03-21 (×3): 2 g via INTRAVENOUS
  Filled 2015-03-20 (×5): qty 2

## 2015-03-20 NOTE — Progress Notes (Signed)
Nutrition Follow-up  DOCUMENTATION CODES:   Obesity unspecified  INTERVENTION:    Continue Vital High Protein via NGT at 50 ml/h (1200 ml/day) with Prostat 30 ml BID to provide 1400 kcals, 135 gm protein, 1003 ml free water daily  NUTRITION DIAGNOSIS:   Inadequate oral intake related to inability to eat as evidenced by NPO status.  Ongoing  GOAL:   Provide needs based on ASPEN/SCCM guidelines  Met  MONITOR:   Vent status, Labs, Weight trends, Skin, I & O's  ASSESSMENT:   40 yo autistic with limited vocabulary, recently admitted 2/17-2/27 for severe CAP.  CT scan negative for pulmonary embolism and showing bilateral lower lobe consolidation with patchy upper lobe infiltrates, rapid flu test was negative as well as urine strep antigen.  Echo showed diastolic dysfunction.  Pt had difficulty getting oxygen upon return home, his sister- POA, noted increasing hypoxia and brought him back to hospital.  Labs reviewed: sodium, BUN, creatinine, magnesium elevated S/P tracheostomy today. Patient is currently receiving Vital High Protein via NGT at 50 ml/h (1200 ml/day) with Prostat 30 ml BID to provide 1400 kcals, 135 gm protein, 1003 ml free water daily.  Patient remains intubated on ventilator support Temp (24hrs), Avg:100.2 F (37.9 C), Min:98.8 F (37.1 C), Max:102.8 F (39.3 C)   Diet Order:  Diet NPO time specified  Skin:  Reviewed, no issues  Last BM:  3/8  Height:   Ht Readings from Last 1 Encounters:  03/12/15 5' 7"  (1.702 m)    Weight:   Wt Readings from Last 1 Encounters:  03/20/15 216 lb 14.9 oz (98.4 kg)    Ideal Body Weight:  67.3 kg  BMI:  Body mass index is 33.97 kg/(m^2).  Estimated Nutritional Needs:   Kcal:  2979-8921  Protein:  >/= 135 grams  Fluid:  1.5 L  EDUCATION NEEDS:   No education needs identified at this time  Molli Barrows, [place], [place], [place] Pager [phone] After Hours Pager [phone]

## 2015-03-20 NOTE — Progress Notes (Signed)
Pharmacy Antibiotic Note  40 YO Male with VDRF/PNA completed 8 days of abx. Now restarting cefepime due to fevers, Tm 101.9  Plan: Continue Vancomycin 750 mg IV q24h Continue Cefepime 2 g IV q12h F/U renal function VT at Css  Height: 5\' 7"  (170.2 cm) Weight: 216 lb 14.9 oz (98.4 kg) IBW/kg (Calculated) : 66.1  Temp (24hrs), Avg:100.2 F (37.9 C), Min:98.8 F (37.1 C), Max:102.8 F (39.3 C)   Recent Labs Lab 03/16/15 1208 03/17/15 0421 03/18/15 0400 03/19/15 0120 03/19/15 0420 03/20/15 0354  WBC 18.7* 16.9* 13.9*  --  14.3* 13.7*  CREATININE 1.28* 1.60* 1.83*  --  2.38* 2.76*  VANCOTROUGH  --   --   --  32*  --   --     Estimated Creatinine Clearance: 40.9 mL/min (by C-G formula based on Cr of 2.76).    Allergies  Allergen Reactions  . Peanuts [Peanut Oil] Anaphylaxis    Antimicrobials this admission: Cefepime 2/28 >> 3/7, 3/9 >> Vanc 2/28 >> 3/4, resumed 3/5 >>  Dose adjustments this admission: 3/1 VT = 27 on 1g q8 >> reduced to 750 q12 (SCr 1.72) 3/8 VT = 32 >> reduced to 1g/24h  Microbiology results: 2/28 urine cx - negative 2/28 blood cx - NGTD 2/28 RVP - negative 2/28 Flu PCR -negative 2/28 Strep UA -negative 2/28 Legionella -negative 2/28 MRSA PCR -negative 3/4 resp cx - yeast    Agapito GamesAlison Glayds [place], PharmD, BCPS Clinical Pharmacist Pager: [phone] 03/20/2015 1:22 PM

## 2015-03-20 NOTE — Care Management Note (Signed)
Case Management Note  Patient Details  Name: Juan RipperRobert Cameron MRN: [id number] Date of Birth: 04-19-1975  Subjective/Objective:  Pt admitted from home with Respiratory Distress. Pt lives with sister - baseline lower functioning autistic.                  Action/Plan: 03/20/2015 Pt trached.  CM spoke with physician advisor; pt not appropriate for LTACH at this time.  CSW following pt for SNF placement.  CM will continue to monitor for disposition needs   03/14/15 Pt in on ventilator  Pt is recent admission that was d/c home with 02 via [facility] on 4L via [place]. [facility] for [facility]. Plan for transfer to unit. CM to continue to monitor for additional needs.    Expected Discharge Date:                  Expected Discharge Plan:  Home w Home Health Services  In-House Referral:  NA  Discharge planning Services  CM Consult  Post Acute Care Choice:  Home Health Choice offered to:     DME Arranged:    DME Agency:  [facility]  HH [facility]     Status of Service:  In process, will continue to follow  Medicare Important Message Given:  Yes Date Medicare IM Given:    Medicare IM give by:    Date Additional Medicare IM Given:    Additional Medicare Important Message give by:     If discussed at Long Length of Stay Meetings, dates discussed:    Additional Comments:  Cherylann ParrClaxton, Jeraldine Primeau S, RN 03/20/2015, 2:48 PM

## 2015-03-20 NOTE — NC FL2 (Signed)
[place] MEDICAID FL2 LEVEL OF CARE SCREENING TOOL     IDENTIFICATION  Patient Name: Juan Cameron Birthdate: 02-21-1975 Sex: male Admission Date (Current Location): 03/11/2015  [facility] and [place] Number:  Producer, television/film/video and Address:  The . [facility], [address], [place], [place] [address]      Provider Number: [id number]  Attending Physician Name and Address:  Oretha Milch, MD  Relative Name and Phone Number:  Raquel Sayres [phone])    Current Level of Care: Hospital Recommended Level of Care: Vent SNF Prior Approval Number:    Date Approved/Denied:  03/20/2015 PASRR Number:   [id number]  Discharge Plan: SNF    Current Diagnoses: Patient Active Problem List   Diagnosis Date Noted  . Acute respiratory failure with hypoxemia (HCC)   . ARDS (adult respiratory distress syndrome) (HCC)   . HCAP (healthcare-associated pneumonia) 03/11/2015  . Leukocytosis 03/11/2015  . Acute hyperglycemia 03/11/2015  . Left ventricular diastolic dysfunction, NYHA class 2 03/11/2015  . Thrombocytopenia (HCC) 03/11/2015  . Acute lung injury 03/10/2015  . Acute respiratory failure with hypoxia (HCC) 02/28/2015  . CAP (community acquired pneumonia) 02/28/2015  . Hypokalemia 02/28/2015  . Autism 02/28/2015    Orientation RESPIRATION BLADDER Height & Weight      (unable to assess)  Vent Continent Weight: 216 lb 14.9 oz (98.4 kg) Height:   (170.2 cm)  BEHAVIORAL SYMPTOMS/MOOD NEUROLOGICAL BOWEL NUTRITION STATUS  Other (Comment) (Autistic)   Incontinent NG/panda  AMBULATORY STATUS COMMUNICATION OF NEEDS Skin   Total Care Non-Verbally Normal                       Personal Care Assistance Level of Assistance  Total care       Total Care Assistance: Maximum assistance   Functional Limitations Info  Sight, Hearing, Speech Sight Info: Adequate Hearing Info: Adequate Speech Info: Impaired    SPECIAL CARE FACTORS FREQUENCY                      Contractures Contractures Info: Not present    Additional Factors Info  Code Status, Allergies, Suctioning Needs, Insulin Sliding Scale Code Status Info: FULL Allergies Info: Peanuts   Insulin Sliding Scale Info: 2-6 units 6x daily   Suctioning Needs: tracheal suctioning every 2-4 hrs; oral suctioning PRN (every 2-4 hrs)   Current Medications (03/20/2015):  This is the current hospital active medication list Current Facility-Administered Medications  Medication Dose Route Frequency Provider Last Rate Last Dose  . acetaminophen (TYLENOL) solution 650 mg  650 mg Per Tube Q4H PRN Jose Alexis Frock, MD   650 mg at 03/19/15 2139   Or  . acetaminophen (TYLENOL) suppository 650 mg  650 mg Rectal Q4H PRN Jose Angelo A Christene Slates, MD      . antiseptic oral rinse solution (CORINZ)  7 mL Mouth Rinse QID Cyril Mourning V, MD   7 mL at 03/20/15 0349  . chlorhexidine gluconate (PERIDEX) 0.12 % solution 15 mL  15 mL Mouth Rinse BID Cyril Mourning V, MD   15 mL at 03/20/15 0852  . dexmedetomidine (PRECEDEX) 400 MCG/100ML (4 mcg/mL) infusion  0.4-1.2 mcg/kg/hr Intravenous Titrated Jose Alexis Frock, MD   Stopped at 03/18/15 1508  . dextrose 5 % solution   Intravenous Continuous Alyson Reedy, MD 50 mL/hr at 03/19/15 2039    . enoxaparin (LOVENOX) injection 50 mg  50 mg Subcutaneous Q24H Riley Lam  Leandrew Koyanagi, MD   50 mg at 03/19/15 0908  . etomidate (AMIDATE) injection 40 mg  40 mg Intravenous Once Alyson Reedy, MD      . feeding supplement (PRO-STAT SUGAR FREE 64) liquid 30 mL  30 mL Per Tube BID Lupita Leash, MD   30 mL at 03/19/15 2136  . feeding supplement (VITAL HIGH PROTEIN) liquid 1,000 mL  1,000 mL Per Tube Q24H Lupita Leash, MD   1,000 mL at 03/19/15 1300  . fentaNYL (SUBLIMAZE) 2,500 mcg in sodium chloride 0.9 % 250 mL (10 mcg/mL) infusion  25-300 mcg/hr Intravenous Continuous Jeanella Craze, NP 15 mL/hr at 03/20/15 0942 150 mcg/hr at 03/20/15 0942  . fentaNYL  (SUBLIMAZE) bolus via infusion 50 mcg  50 mcg Intravenous Q30 min PRN Jeanella Craze, NP   50 mcg at 03/16/15 0830  . fentaNYL (SUBLIMAZE) injection 100 mcg  100 mcg Intravenous Once PRN Jeanella Craze, NP      . fentaNYL (SUBLIMAZE) injection 200 mcg  200 mcg Intravenous Once Alyson Reedy, MD      . free water 250 mL  250 mL Per Tube Q6H Alyson Reedy, MD   250 mL at 03/20/15 0300  . guaiFENesin (MUCINEX) 12 hr tablet 600 mg  600 mg Oral BID PRN Russella Dar, NP      . haloperidol lactate (HALDOL) injection 5 mg  5 mg Intravenous Q6H Roslynn Amble, MD   5 mg at 03/20/15 0550  . insulin aspart (novoLOG) injection 2-6 Units  2-6 Units Subcutaneous 6 times per day Alyson Reedy, MD   2 Units at 03/20/15 0349  . ipratropium-albuterol (DUONEB) 0.5-2.5 (3) MG/3ML nebulizer solution 3 mL  3 mL Nebulization Q4H Russella Dar, NP   3 mL at 03/20/15 0731  . midazolam (VERSED) 50 mg in sodium chloride 0.9 % 50 mL (1 mg/mL) infusion  1-8 mg/hr Intravenous Continuous Roslynn Amble, MD 4 mL/hr at 03/20/15 0942 4 mg/hr at 03/20/15 0942  . midazolam (VERSED) injection 2 mg  2 mg Intravenous Q1H PRN Karl Ito, MD   2 mg at 03/17/15 1717  . midazolam (VERSED) injection 4 mg  4 mg Intravenous Once Alyson Reedy, MD      . norepinephrine (LEVOPHED) 4 mg in dextrose 5 % 250 mL (0.016 mg/mL) infusion  2-50 mcg/min Intravenous Continuous Jeanella Craze, NP 15 mL/hr at 03/20/15 0853 4 mcg/min at 03/20/15 0853  . ondansetron (ZOFRAN) tablet 4 mg  4 mg Oral Q6H PRN Russella Dar, NP       Or  . ondansetron [facility]) injection 4 mg  4 mg Intravenous Q6H PRN Russella Dar, NP      . pantoprazole sodium (PROTONIX) 40 mg/20 mL oral suspension 40 mg  40 mg Per Tube Q1200 Cyril Mourning V, MD   40 mg at 03/19/15 1232  . propofol (DIPRIVAN) 500 MG/50ML infusion  5-80 mcg/kg/min Intravenous Once Alyson Reedy, MD      . sodium chloride flush (NS) 0.9 % injection 3 mL  3 mL Intravenous Q12H Russella Dar,  NP   3 mL at 03/19/15 2139  . vancomycin (VANCOCIN) IVPB 750 mg/150 ml premix  750 mg Intravenous Q24H Cyril Mourning V, MD      . vecuronium (NORCURON) injection 10 mg  10 mg Intravenous Once Alyson Reedy, MD      . white petrolatum (VASELINE) gel   Topical PRN  Shon Batonourtney F Horton, MD         Discharge Medications: Please see discharge summary for a list of discharge medications.  Relevant Imaging Results:  Relevant Lab Results:   Additional Information SS #: [id number]  Rockwell GermanyAshley N Gardner, LCSW    Not my patient - dr Tyson Aliasfeinstein

## 2015-03-20 NOTE — Procedures (Addendum)
Bedside Tracheostomy Insertion Procedure Note   Patient Details:   Name: Juan RipperRobert Cameron DOB: 06-13-1975 MRN: [id number]  Procedure: Tracheostomy  Pre Procedure Assessment: ET Tube Size:7.5 ET Tube secured at lip (cm):24 Bite block in place:  Breath Sounds: bilateral breath sounds  Post Procedure Assessment: BP 105/60 mmHg  Pulse 125  Temp(Src) 99.1 F (37.3 C) (Oral)  Resp 31  Ht 5\' 7"  (1.702 m)  Wt 216 lb 14.9 oz (98.4 kg)  BMI 33.97 kg/m2  SpO2 93% O2 sats 94% Complications: no complications noted Patien tdid tolerate procedure well Tracheostomy Brand:shiley  Tracheostomy Style:cuffed perc Tracheostomy Size: 6.0 Tracheostomy Secured WUJ:WJXBJYNvia:sutures and velcro ties Tracheostomy Placement Confirmation:video scope   Bronchial washing specimen obtained during procedure- sample sent to lab by RN  Charmian MuffBrewer, Natasha BenceSherry Faye 03/20/2015, 3:52 PM

## 2015-03-20 NOTE — Procedures (Signed)
Procedure done by P Babcock ACNP-BC. At first bronch was introduce through ET tube. This was caked w/ thick brown tenacious secretions which required several lavages and trach clean-outs in order to get past the ETT and visualize the structures of tracheal rings, carina identified for operator of tracheostomy who was Dr Molli KnockYacoub. Light of bronch passed through trachea and skin for indentification of tracheal rings for tracheostomy puncture. After this, under bronchoscopy guidance,  ET tube was pulled back sufficiently and very carefully. The ET tube was  pulled back enough to give room for tracheostomy operator and yet at same time to to ensure a secured airway. After this was accomplished, bronchoscope was withdrawn into the ET tube. After this,  Dr Molli KnockYacoub then performed tracheostomy under video visual provided by flexible video bronchoscopy. Followng introduction of tracheostomy,  the bronchoscope was removed from ET tube and introduced through tracheostomy. Correct position of tracheostomy was ensured, with enough room between carina and distal tracheostomy and no evidence of bleeding. The bronchoscope was then withdrawn. Respiratory therapist was then instructed to remove the ET tube.  Dr Molli KnockYacoub then proceeded to complete the tracheostomy with stay sutures.   After the case the bronchoscope was advanced once again for airway evaluation. The LUL, L lingula and LLL were unremarkable. The RUL, RML were unremarkable. The RLL had significant areas of what was likely suction trauma and also some purulent drainage from the anterior lower lobe. From here a BAL was obtained for culture.     No complications   Simonne MartinetPeter E Babcock ACNP-BC [facility] Pulmonary/Critical Care Pager # [phone] OR # [phone] if no answer  Alyson ReedyWesam G. Diala Waxman, M.D. [facility] Pulmonary/Critical Care Medicine. Pager: [phone]. After hours pager: [phone].

## 2015-03-20 NOTE — Progress Notes (Signed)
UR Completed. Brittiney Dicostanzo, RN, BSN.  [phone] 

## 2015-03-20 NOTE — Progress Notes (Signed)
Pt's sister [name] would like to be present during am wake-up assessments, Sister request am call 30 mins prior to starting wake-up assessment in hope pt will not be scared and confused due to mental challenges, this nurse will pass to next shift report

## 2015-03-20 NOTE — Progress Notes (Signed)
PULMONARY / CRITICAL CARE MEDICINE   Name: Juan Cameron MRN: [id number] DOB: Apr 26, 1975    ADMISSION DATE:  03/11/2015  CONSULTATION DATE: 2/28  REFERRING MD: [facility]  CHIEF COMPLAINT: resp failure  HISTORY OF PRESENT ILLNESS:  [age] autistic male recently in hospital for multilobar pneumonia and dc'd on 6 l [place] and returned in 24 hours with sats in 50%. He was discharged on 03/10/15 after treatment for CAP, NOI, and was on 6 l [place]. Sister [facility](HCPOA) checked sats and he was 50% and therefore [facility] and required 80% fio2 on NIVMS with RR 45. CXR consistent early ARDS, 2d echo with nl EF but grade 2 dysfunction. PCCM called to bedside on 3/1 with anticipation of requiring to be intubated.  SUBJECTIVE:    Not following commands.  VITAL SIGNS: BP 101/53 mmHg  Pulse 117  Temp(Src) 99.1 F (37.3 C) (Oral)  Resp 20  Ht 5\' 7"  (1.702 m)  Wt 98.4 kg (216 lb 14.9 oz)  BMI 33.97 kg/m2  SpO2 91%  HEMODYNAMICS:    VENTILATOR SETTINGS: Vent Mode:  [-] PRVC FiO2 (%):  [40 %-60 %] 60 % Set Rate:  [20 bmp] 20 bmp Vt Set:  [460 mL] 460 mL PEEP:  [5 cmH20] 5 cmH20 Plateau Pressure:  [23 cmH20-26 cmH20] 26 cmH20  INTAKE / OUTPUT: I/O last 3 completed shifts: In: 5216.9 [I.V.:3126.9; NG/GT:2040; IV Piggyback:50] Out: 3450 [Urine:3450]  PHYSICAL EXAMINATION: General: Sedate, withdrawing to pain. Moves all ext Neuro: Withdraws all ext to pain but not command. HEENT: [place]/AT, PERRL, EOM-I and MMM. Cardiovascular: RRR, Nl S1/S2, no MRG Lungs: Diminshed throughout. Abdomen: Obese , protrubent, hypoactive bowel sounds. Musculoskeletal: intact Skin: Warm, moist  LABS:  BMET  Recent Labs Lab 03/18/15 0400 03/19/15 0420 03/20/15 0354  NA 160* 156* 154*  K 4.4 4.6 3.9  CL 124* 124* 118*  CO2 26 27 24   BUN 47* 55* 57*  CREATININE 1.83* 2.38* 2.76*  GLUCOSE 147* 124* 125*   Electrolytes  Recent Labs Lab 03/18/15 0400 03/19/15 0420 03/20/15 0354   CALCIUM 8.5* 8.1* 8.2*  MG 3.0* 2.7* 2.6*  PHOS 4.3 4.1 3.4    CBC  Recent Labs Lab 03/18/15 0400 03/19/15 0420 03/20/15 0354  WBC 13.9* 14.3* 13.7*  HGB 11.7* 11.5* 10.2*  HCT 37.5* 37.0* 31.8*  PLT 119* 110* 100*   Coag's  Recent Labs Lab 03/20/15 0354  APTT 33  INR 1.37    Sepsis Markers No results for input(s): LATICACIDVEN, PROCALCITON, O2SATVEN in the last 168 hours. ABG  Recent Labs Lab 03/18/15 0314 03/19/15 0420 03/20/15 0410  PHART 7.326* 7.361 7.392  PCO2ART 52.3* 47.0* 42.5  PO2ART 88.0 81.0 86.8   Liver Enzymes No results for input(s): AST, ALT, ALKPHOS, BILITOT, ALBUMIN in the last 168 hours.  Cardiac Enzymes No results for input(s): TROPONINI, PROBNP in the last 168 hours.  Glucose  Recent Labs Lab 03/19/15 1600 03/19/15 2035 03/20/15 0007 03/20/15 0342 03/20/15 0823 03/20/15 1158  GLUCAP 122* 130* 121* 121* 109* 110*    Imaging Dg Chest Port 1 View  03/20/2015  CLINICAL DATA:  Acute respiratory failure, intubated patient, Health Care associated pneumonia, ARDS, CHF. EXAM: PORTABLE CHEST 1 VIEW COMPARISON:  Portable chest x-ray of March 19, 2015 FINDINGS: The lungs remain mildly hypoinflated. Persistent coarse lung markings are present on the left in the mid and lower lung and at the right lung base with obscuration of the hemidiaphragms. The cardiac silhouette remains enlarged. The pulmonary vascularity remains  engorged and indistinct. There is no pneumothorax nor large pleural effusion. The endotracheal tube tip lies 2.8 cm above the carina. The esophagogastric tube tip projects below the inferior margin of the image. The left internal jugular venous catheter tip projects over the junction of the left internal jugular vein with the right subclavian vein. IMPRESSION: Fairly stable appearance of the chest with persistent bibasilar atelectasis or pneumonia. No pneumothorax observed. CHF with mild pulmonary interstitial edema is also unchanged.  The support tubes are in reasonable position. Electronically Signed   By: David  [place] M.D.   On: 03/20/2015 07:13   CULTURES: 2/28 bc x 2 negative 2/28 uc negative 2/28 RVP negative Sputum culture 3/4 >>yeast Blood culture 3/4 >>  ANTIBIOTICS: 2/28 vanc>> 2/28 cefepime>>  SIGNIFICANT EVENTS: 2/28 readmit to hospital  03/12/15 Intubated 3/1 Bronchoscopy   LINES/TUBES: Central line L IJ TLC>3/1  DISCUSSION: 62 autistic male recently in hospital for multilobar PNA and dc'd on 6 l [place] and returned in 24 hours with sats in 50%. Was intubated on 3/1 on full vent support, with increased O2 demands, hemodynamically unstable requiring pressors.  ASSESSMENT / PLAN:  PULMONARY A: Acute hypoxic resp failure. Suspected recurrent pna/HCAP/ asp pna.  Early ARDS Suspected undiagnosed OSA P:  On full vent.  FiO2 60% and PEEP to 5. PS when more awake. Abx as below. Trach today.  CARDIOVASCULAR A:  Septic Shock -- improved.  P:  Off pressors. Tachycardic 2 to fever and agitation.   RENAL Lab Results  Component Value Date   CREATININE 2.76* 03/20/2015   CREATININE 2.38* 03/19/2015   CREATININE 1.83* 03/18/2015  A:  Acute Kidney Injury Elevated but stable Na. P:  Trend BUN and creatinine. Creat stable.  Free water 250 ml q6 hours. Added D5W at 50 ml/hr.  GASTROINTESTINAL A:  No active issues P:  Cont TF post trach. Place NGT.  HEMATOLOGIC A:  Hx of sickle cell trait VTE  P:  D/C lovenox. Start SQ heparin.  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 Cultures (-) so far.  P:  Continue Vanc/cefepime. Follow CXR. Cultures negative but moderate yeast in sputum.  ENDOCRINE A:  hyperglycemia  P:  ISS ordered moderate.  NEUROLOGIC A:  Mentally impaired with limited vocabulary of 500 words.  Autism  Decreased LOC from metabolic disarray. P:  RASS goal: -1 Requires sedatives. On  fantanyl drip and versed PRN. Precedex drip.  Spoke with sister regarding trach/peg with continuous failure to wean.  They understand that trach in this condition may facilitate weaning.  However, they understand and are willing to take the risk of tracheostomy including him pulling his trach out.  Spoke with sister and god mother, all questions answered.    The patient is critically ill with multiple organ systems failure and requires high complexity decision making for assessment and support, frequent evaluation and titration of therapies, application of advanced monitoring technologies and extensive interpretation of multiple databases.   Critical Care Time devoted to patient care services described in this note is  35  Minutes. This time reflects time of care of this signee Dr Koren Bound. This critical care time does not reflect procedure time, or teaching time or supervisory time of PA/NP/Med student/Med Resident etc but could involve care discussion time.  Alyson Reedy, M.D. [facility] Pulmonary/Critical Care Medicine. Pager: [phone]. After hours pager: [phone].  03/20/2015, 1:05 PM

## 2015-03-20 NOTE — Procedures (Signed)
Percutaneous Tracheostomy Placement  Consent from family.  Patient sedated, paralyzed and position.  Placed on 100% FiO2 and RR matched.  Area cleaned and draped.  Lidocaine/epi injected.  Skin incision done followed by blunt dissection.  Trachea palpated then punctured, catheter passed and visualized bronchoscopically.  Wire placed and visualized.  Catheter removed.  Airway then crushed and dilated.  Size 6 cuffed shiley trach placed and visualized bronchoscopically well above carina.  Good volume returns.  Patient tolerated the procedure well without complications.  Minimal blood loss.  CXR ordered and pending.  Wesam G. Yacoub, M.D. [place] Pulmonary/Critical Care Medicine. Pager: [phone]. After hours pager: [phone]. 

## 2015-03-21 ENCOUNTER — Inpatient Hospital Stay (HOSPITAL_COMMUNITY): Payer: Medicare Other

## 2015-03-21 DIAGNOSIS — R509 Fever, unspecified: Secondary | ICD-10-CM

## 2015-03-21 DIAGNOSIS — N289 Disorder of kidney and ureter, unspecified: Secondary | ICD-10-CM

## 2015-03-21 DIAGNOSIS — E669 Obesity, unspecified: Secondary | ICD-10-CM | POA: Diagnosis present

## 2015-03-21 LAB — CBC
HCT: 29 % — ABNORMAL LOW (ref 39.0–52.0)
Hemoglobin: 9.3 g/dL — ABNORMAL LOW (ref 13.0–17.0)
MCH: 27.4 pg (ref 26.0–34.0)
MCHC: 32.1 g/dL (ref 30.0–36.0)
MCV: 85.3 fL (ref 78.0–100.0)
PLATELETS: 113 10*3/uL — AB (ref 150–400)
RBC: 3.4 MIL/uL — AB (ref 4.22–5.81)
RDW: 16.6 % — AB (ref 11.5–15.5)
WBC: 17.2 10*3/uL — ABNORMAL HIGH (ref 4.0–10.5)

## 2015-03-21 LAB — GLUCOSE, CAPILLARY
GLUCOSE-CAPILLARY: 140 mg/dL — AB (ref 65–99)
GLUCOSE-CAPILLARY: 116 mg/dL — AB (ref 65–99)
Glucose-Capillary: 118 mg/dL — ABNORMAL HIGH (ref 65–99)
Glucose-Capillary: 136 mg/dL — ABNORMAL HIGH (ref 65–99)
Glucose-Capillary: 141 mg/dL — ABNORMAL HIGH (ref 65–99)
Glucose-Capillary: 145 mg/dL — ABNORMAL HIGH (ref 65–99)
Glucose-Capillary: 154 mg/dL — ABNORMAL HIGH (ref 65–99)
Glucose-Capillary: 90 mg/dL (ref 65–99)

## 2015-03-21 LAB — BASIC METABOLIC PANEL
Anion gap: 6 (ref 5–15)
BUN: 53 mg/dL — AB (ref 6–20)
CALCIUM: 8.2 mg/dL — AB (ref 8.9–10.3)
CO2: 25 mmol/L (ref 22–32)
CREATININE: 2.58 mg/dL — AB (ref 0.61–1.24)
Chloride: 118 mmol/L — ABNORMAL HIGH (ref 101–111)
GFR calc Af Amer: 35 mL/min — ABNORMAL LOW (ref >60)
GFR, EST NON AFRICAN AMERICAN: 30 mL/min — AB (ref >60)
Glucose, Bld: 121 mg/dL — ABNORMAL HIGH (ref 65–99)
Potassium: 4.5 mmol/L (ref 3.5–5.1)
SODIUM: 149 mmol/L — AB (ref 135–145)

## 2015-03-21 LAB — BLOOD GAS, ARTERIAL
Acid-base deficit: 0.5 mmol/L (ref 0.0–2.0)
Bicarbonate: 24.7 mEq/L — ABNORMAL HIGH (ref 20.0–24.0)
Drawn by: 252031
FIO2: 0.8
LHR: 22 {breaths}/min
MECHVT: 460 mL
O2 Saturation: 93 %
PATIENT TEMPERATURE: 103
PCO2 ART: 54.6 mmHg — AB (ref 35.0–45.0)
PEEP: 8 cmH2O
PH ART: 7.292 — AB (ref 7.350–7.450)
PO2 ART: 80.3 mmHg (ref 80.0–100.0)
TCO2: 26.2 mmol/L (ref 0–100)

## 2015-03-21 LAB — DIFFERENTIAL
BASOS ABS: 0 10*3/uL (ref 0.0–0.1)
BASOS PCT: 0 %
EOS PCT: 1 %
Eosinophils Absolute: 0.2 10*3/uL (ref 0.0–0.7)
LYMPHS ABS: 2 10*3/uL (ref 0.7–4.0)
Lymphocytes Relative: 12 %
MONO ABS: 0.7 10*3/uL (ref 0.1–1.0)
Monocytes Relative: 4 %
NEUTROS PCT: 83 %
Neutro Abs: 14.7 10*3/uL — ABNORMAL HIGH (ref 1.7–7.7)

## 2015-03-21 LAB — MAGNESIUM: MAGNESIUM: 2.6 mg/dL — AB (ref 1.7–2.4)

## 2015-03-21 LAB — PHOSPHORUS: Phosphorus: 2.5 mg/dL (ref 2.5–4.6)

## 2015-03-21 MED ORDER — HALOPERIDOL LACTATE 5 MG/ML IJ SOLN
5.0000 mg | Freq: Four times a day (QID) | INTRAMUSCULAR | Status: DC | PRN
  Administered 2015-03-21 – 2015-03-22 (×2): 5 mg via INTRAVENOUS
  Filled 2015-03-21 (×2): qty 1

## 2015-03-21 MED ORDER — FUROSEMIDE 10 MG/ML IJ SOLN
5.0000 mg/h | INTRAVENOUS | Status: DC
  Administered 2015-03-21 (×2): 5 mg/h via INTRAVENOUS
  Filled 2015-03-21: qty 25

## 2015-03-21 MED ORDER — QUETIAPINE FUMARATE 200 MG PO TABS
200.0000 mg | ORAL_TABLET | Freq: Two times a day (BID) | ORAL | Status: DC
  Administered 2015-03-21 – 2015-03-25 (×9): 200 mg via ORAL
  Filled 2015-03-21 (×13): qty 1

## 2015-03-21 MED ORDER — METOLAZONE 5 MG PO TABS
5.0000 mg | ORAL_TABLET | Freq: Every day | ORAL | Status: AC
  Administered 2015-03-21: 5 mg via ORAL
  Filled 2015-03-21: qty 1

## 2015-03-21 NOTE — Progress Notes (Signed)
Dr. Molli KnockYacoub made aware of temperature.  To order cooling blanket after patients RASS is -1.  All sedation turned off.  Sister at bedside to help comfort patient.

## 2015-03-21 NOTE — Progress Notes (Signed)
Wasted 220 mL of fentanyl and 10 mL of versed with Ambrose MantleJoshua Draper, RN

## 2015-03-21 NOTE — Consult Note (Signed)
[facility]    Date of Admission:  03/11/2015           Day 10 vancomycin        Day 10 cefepime       Reason for Consult: Persistent fever and respiratory failure    Referring Physician: Dr. Jefm Miles  Principal Problem:   Acute respiratory failure with hypoxia [facility]) Active Problems:   HCAP (healthcare-associated pneumonia)   ARDS (adult respiratory distress syndrome) (HCC)   Fever   Acute renal insufficiency   Autism   Acute lung injury   Leukocytosis   Acute hyperglycemia   Left ventricular diastolic dysfunction, NYHA class 2   Thrombocytopenia (HCC)   Obesity   . antiseptic oral rinse  7 mL Mouth Rinse QID  . ceFEPime (MAXIPIME) IV  2 g Intravenous Q12H  . chlorhexidine gluconate  15 mL Mouth Rinse BID  . feeding supplement (PRO-STAT SUGAR FREE 64)  30 mL Per Tube BID  . feeding supplement (VITAL HIGH PROTEIN)  1,000 mL Per Tube Q24H  . fentaNYL (SUBLIMAZE) injection  200 mcg Intravenous Once  . free water  250 mL Per Tube Q6H  . heparin subcutaneous  5,000 Units Subcutaneous 3 times per day  . insulin aspart  2-6 Units Subcutaneous 6 times per day  . ipratropium-albuterol  3 mL Nebulization Q4H  . midazolam  4 mg Intravenous Once  . pantoprazole sodium  40 mg Per Tube Q1200  . propofol  5-80 mcg/kg/min Intravenous Once  . QUEtiapine  200 mg Oral BID  . sodium chloride flush  3 mL Intravenous Q12H  . vancomycin  750 mg Intravenous Q24H    Recommendations: 1. Discontinue vancomycin and cefepime 2. Await results of repeat sputum culture 3. Urine and blood cultures 4. CBC with differential   Assessment: Juan Cameron may have had community-acquired pneumonia in mid February but it is not clear to me that he has active pneumonia at this point. He has developed acute renal insufficiency and has high vancomycin trough levels. I favor stopping both vancomycin and cefepime now. His fever may be unrelated to respiratory failure. I  will check urine and blood cultures.    HPI: Juan Cameron is a 40 y.o. male with autism who developed cough and shortness of breath in mid February. He saw his primary care physician who obtained a chest x-ray which showed bibasilar infiltrates. He was treated with nebulizers but got worse leading to admission here on 02/28/2015. He had bibasilar infiltrates and hypoxia. He was afebrile on admission with a normal white blood cell count. No sputum could be obtained. A respiratory virus panel was negative. He was treated with empiric antibiotic therapy with levofloxacin for presumed community-acquired pneumonia. He was also diuresed after he was found to have some degree of diastolic dysfunction. He was also treated with steroids for possible acute lung injury. He completed antibiotic therapy on 03/07/2015. He began having fever shortly before discharge on 03/11/2015. He was sent home on home oxygen at 4 L nasal cannula. Shortly after discharge she became more short of breath and hypoxic leading to readmission on 03/11/2015. His chest x-ray was unchanged with low lung volumes and bibasilar haziness. He required intubation. He underwent bronchoscopy and BAL. His left lower lobe was noted to be " clean and free of secretions". No white blood cells or organisms were seen on lavage fluid and cultures were negative. Repeat sputum cultures on 03/15/2015  and 03/16/2015 grew only yeast. Sputum obtained yesterday showed no organisms and the culture is pending. He has been on empiric cefepime and vancomycin since readmission without obvious improvement. His vancomycin trough levels have been supratherapeutic and he has developed acute renal insufficiency. He has had high, persistent fever since 03/13/2005.   Review of Systems: Review of Systems  Unable to perform ROS: intubated    Past Medical History  Diagnosis Date  . Autism   . Sickle cell trait (HCC)   . Pneumonia 02/2015  . Autism     Social History    Substance Use Topics  . Smoking status: Never Smoker   . Smokeless tobacco: Never Used  . Alcohol Use: No    Family History  Problem Relation Age of Onset  . Heart attack Mother    Allergies  Allergen Reactions  . Peanuts [Peanut Oil] Anaphylaxis    OBJECTIVE: Blood pressure 101/56, pulse 127, temperature 102.9 F (39.4 C), temperature source Oral, resp. rate 31, height 5\' 7"  (1.702 m), weight 216 lb 14.9 oz (98.4 kg), SpO2 92 %.  Physical Exam  Constitutional:  He has a tracheostomy and is on the ventilator. He opens his eyes to voice but doesn't follow commands.  Eyes: Conjunctivae are normal.  Cardiovascular: Regular rhythm.   No murmur heard. He is tachycardic.  Pulmonary/Chest: Breath sounds normal.  Clear lungs anteriorly.  Abdominal: Soft. There is no tenderness.  Obese.  Skin: Rash noted.  Nonpustular folliculitis of the chest. Questionable birthmark on left forearm. IV sites appear normal  Psychiatric: Mood and affect normal.    Lab Results Lab Results  Component Value Date   WBC 17.2* 03/21/2015   HGB 9.3* 03/21/2015   HCT 29.0* 03/21/2015   MCV 85.3 03/21/2015   PLT 113* 03/21/2015    Lab Results  Component Value Date   CREATININE 2.58* 03/21/2015   BUN 53* 03/21/2015   NA 149* 03/21/2015   K 4.5 03/21/2015   CL 118* 03/21/2015   CO2 25 03/21/2015    Lab Results  Component Value Date   ALT 25 03/12/2015   AST 51* 03/12/2015   ALKPHOS 44 03/12/2015   BILITOT 0.6 03/12/2015     Microbiology: Recent Results (from the past 240 hour(s))  MRSA PCR Screening     Status: None   Collection Time: 03/11/15  3:31 PM  Result Value Ref Range Status   MRSA by PCR NEGATIVE NEGATIVE Final    Comment:        The GeneXpert MRSA Assay (FDA approved for NASAL specimens only), is one component of a comprehensive MRSA colonization surveillance program. It is not intended to diagnose MRSA infection nor to guide or monitor treatment for MRSA  infections.   Urine culture     Status: None   Collection Time: 03/11/15  7:00 PM  Result Value Ref Range Status   Specimen Description URINE, RANDOM  Final   Special Requests NONE  Final   Culture NO GROWTH 2 DAYS  Final   Report Status 03/13/2015 FINAL  Final  Culture, respiratory (NON-Expectorated)     Status: None   Collection Time: 03/12/15 12:29 PM  Result Value Ref Range Status   Specimen Description BRONCHIAL ALVEOLAR LAVAGE  Final   Special Requests LLL  Final   Gram Stain   Final    NO WBC SEEN NO SQUAMOUS EPITHELIAL CELLS SEEN NO ORGANISMS SEEN Performed at [facility]    Culture   Final    NO  GROWTH 2 DAYS Performed at [facility]    Report Status 03/15/2015 FINAL  Final  Culture, blood (routine x 2)     Status: None   Collection Time: 03/15/15  2:45 PM  Result Value Ref Range Status   Specimen Description BLOOD RIGHT ANTECUBITAL  Final   Special Requests BOTTLES DRAWN AEROBIC AND ANAEROBIC 10CC  Final   Culture NO GROWTH 5 DAYS  Final   Report Status 03/20/2015 FINAL  Final  Culture, blood (routine x 2)     Status: None   Collection Time: 03/15/15  3:00 PM  Result Value Ref Range Status   Specimen Description BLOOD BLOOD RIGHT HAND  Final   Special Requests BOTTLES DRAWN AEROBIC AND ANAEROBIC 10CC  Final   Culture NO GROWTH 5 DAYS  Final   Report Status 03/20/2015 FINAL  Final  Culture, respiratory (NON-Expectorated)     Status: None   Collection Time: 03/15/15  3:10 PM  Result Value Ref Range Status   Specimen Description SPUTUM  Final   Special Requests NONE  Final   Gram Stain   Final    FEW WBC PRESENT,BOTH PMN AND MONONUCLEAR NO SQUAMOUS EPITHELIAL CELLS SEEN RARE YEAST THIS SPECIMEN IS ACCEPTABLE FOR SPUTUM CULTURE Performed at [facility]    Culture   Final    MODERATE YEAST Performed at [facility]    Report Status 03/18/2015 FINAL  Final  Gram stain     Status: None   Collection Time: 03/16/15 11:28 AM   Result Value Ref Range Status   Specimen Description TRACHEAL SITE  Final   Special Requests NONE  Final   Gram Stain   Final    FEW WBC PRESENT,BOTH PMN AND MONONUCLEAR FEW YEAST    Report Status 03/16/2015 FINAL  Final  Culture, respiratory (NON-Expectorated)     Status: None   Collection Time: 03/16/15 11:28 AM  Result Value Ref Range Status   Specimen Description TRACHEAL ASPIRATE  Final   Special Requests Normal  Final   Gram Stain   Final    FEW WBC PRESENT,BOTH PMN AND MONONUCLEAR FEW YEAST Performed at [facility] Performed at [facility]    Culture   Final    MODERATE YEAST Performed at [facility]    Report Status 03/18/2015 FINAL  Final  Culture, respiratory (NON-Expectorated)     Status: None (Preliminary result)   Collection Time: 03/20/15  3:08 PM  Result Value Ref Range Status   Specimen Description BRONCHIAL ALVEOLAR LAVAGE  Final   Special Requests NONE  Final   Gram Stain   Final    MODERATE WBC PRESENT, PREDOMINANTLY PMN NO SQUAMOUS EPITHELIAL CELLS SEEN NO ORGANISMS SEEN Performed at [facility]    Culture   Final    NO GROWTH 1 DAY Performed at [facility]    Report Status PENDING  Incomplete    Cliffton Asters, MD [facility] [phone] pager   [phone] cell 03/21/2015, 2:25 PM

## 2015-03-21 NOTE — Care Management Important Message (Signed)
Important Message  Patient Details  Name: Alan RipperRobert Nooney MRN: [id number] Date of Birth: November 29, 1975   Medicare Important Message Given:  Other (see comment)    Eloise Mula Abena 03/21/2015, 11:53 AM

## 2015-03-21 NOTE — Care Management Note (Signed)
Case Management Note  Patient Details  Name: Juan RipperRobert Cameron MRN: [id number] Date of Birth: November 08, 1975  Subjective/Objective:  Pt admitted from home with Respiratory Distress. Pt lives with sister - baseline lower functioning autistic.                  Action/Plan: 03/21/2015  Pt spoke in depth with sister regarding possible need for SNF  post discharge, sister is interested and CSW actively working placement.    03/20/15 Pt trached.  CM spoke with physician advisor; pt not appropriate for LTACH at this time.  CSW following pt for SNF placement.  CM will continue to monitor for disposition needs   03/14/15 Pt in on ventilator  Pt is recent admission that was d/c home with 02 via [facility] on 4L via [place]. [facility] for [facility]. Plan for transfer to unit. CM to continue to monitor for additional needs.    Expected Discharge Date:                  Expected Discharge Plan:  Home w Home Health Services  In-House Referral:  NA  Discharge planning Services  CM Consult  Post Acute Care Choice:  Home Health Choice offered to:     DME Arranged:    DME Agency:  [facility]  HH Arr[facility]    [facility]:     Status of Service:  In process, will continue to follow  Medicare Important Message Given:  Other (see comment) Date Medicare IM Given:    Medicare IM give by:    Date Additional Medicare IM Given:    Additional Medicare Important Message give by:     If discussed at Long Length of Stay Meetings, dates discussed:    Additional Comments:  Cherylann ParrClaxton, Atoya Andrew S, RN 03/21/2015, 3:48 PM

## 2015-03-21 NOTE — Progress Notes (Signed)
Notified Elink RN Marisue IvanLiz in regards to patient's temperature. Patient temperature 103. Given patient Tylenol per NG tube and placed ice on patient. MD Deterding ordered to continue to do the same with tylenol and ice. Will continue to monitor and assess.

## 2015-03-21 NOTE — Progress Notes (Signed)
PULMONARY / CRITICAL CARE MEDICINE   Name: Juan RipperRobert Livingstone MRN: [id number] DOB: [birth year]    ADMISSION DATE:  03/11/2015  CONSULTATION DATE: 2/28  REFERRING MD: [facility]  CHIEF COMPLAINT: resp failure  HISTORY OF PRESENT ILLNESS:  [age] autistic male recently in hospital for multilobar pneumonia and dc'd on 6 l [place] and returned in 24 hours with sats in 50%. He was discharged on 03/10/15 after treatment for CAP, NOI, and was on 6 l [place]. [facility](HCPOA) checked sats and he was 50% and therefore [facility] and required 80% fio2 on NIVMS with RR 45. CXR consistent early ARDS, 2d echo with nl EF but grade 2 dysfunction. PCCM called to bedside on 3/1 with anticipation of requiring to be intubated.  SUBJECTIVE:  Very agitated on vent, desaturating and asynchronous with the vent.  VITAL SIGNS: BP 122/69 mmHg  Pulse 135  Temp(Src) 102.9 F (39.4 C) (Oral)  Resp 22  Ht 5\' 7"  (1.702 m)  Wt 98.4 kg (216 lb 14.9 oz)  BMI 33.97 kg/m2  SpO2 90%  HEMODYNAMICS:    VENTILATOR SETTINGS: Vent Mode:  [-] PRVC FiO2 (%):  [50 %-100 %] 100 % Set Rate:  [20 bmp] 20 bmp Vt Set:  [460 mL] 460 mL PEEP:  [5 cmH20-12 cmH20] 12 cmH20 Plateau Pressure:  [19 cmH20-30 cmH20] 30 cmH20  INTAKE / OUTPUT: I/O last 3 completed shifts: In: 5694.3 [I.V.:2844.3; NG/GT:2750; IV Piggyback:100] Out: 3675 [Urine:3675]  PHYSICAL EXAMINATION: General: Sedate, withdrawing to pain. Moves all ext Neuro: Withdraws all ext to pain but not command. HEENT: [place]/AT, PERRL, EOM-I and MMM. Cardiovascular: RRR, Nl S1/S2, no MRG Lungs: Diminshed throughout. Abdomen: Obese , protrubent, hypoactive bowel sounds. Musculoskeletal: intact Skin: Warm, moist  LABS:  BMET  Recent Labs Lab 03/19/15 0420 03/20/15 0354 03/21/15 0515  NA 156* 154* 149*  K 4.6 3.9 4.5  CL 124* 118* 118*  CO2 27 24 25   BUN 55* 57* 53*  CREATININE 2.38* 2.76* 2.58*  GLUCOSE 124* 125* 121*   Electrolytes  Recent  Labs Lab 03/19/15 0420 03/20/15 0354 03/21/15 0515  CALCIUM 8.1* 8.2* 8.2*  MG 2.7* 2.6* 2.6*  PHOS 4.1 3.4 2.5   CBC  Recent Labs Lab 03/19/15 0420 03/20/15 0354 03/21/15 0515  WBC 14.3* 13.7* 17.2*  HGB 11.5* 10.2* 9.3*  HCT 37.0* 31.8* 29.0*  PLT 110* 100* 113*   Coag's  Recent Labs Lab 03/20/15 0354  APTT 33  INR 1.37   Sepsis Markers No results for input(s): LATICACIDVEN, PROCALCITON, O2SATVEN in the last 168 hours. ABG  Recent Labs Lab 03/19/15 0420 03/20/15 0410 03/21/15 0331  PHART 7.361 7.392 7.292*  PCO2ART 47.0* 42.5 54.6*  PO2ART 81.0 86.8 80.3   Liver Enzymes No results for input(s): AST, ALT, ALKPHOS, BILITOT, ALBUMIN in the last 168 hours.  Cardiac Enzymes No results for input(s): TROPONINI, PROBNP in the last 168 hours.  Glucose  Recent Labs Lab 03/20/15 1158 03/20/15 1558 03/20/15 1957 03/20/15 2359 03/21/15 0331 03/21/15 0734  GLUCAP 110* 124* 143* 90 118* 136*   Imaging Dg Chest Port 1 View  03/21/2015  CLINICAL DATA:  Acute respiratory failure, pneumonia, ARDS, left ventricular diastolic dysfunction. EXAM: PORTABLE CHEST 1 VIEW COMPARISON:  Portable chest x-ray dated March 20, 2015 FINDINGS: The lungs are mildly hypoinflated. The interstitial markings remain coarse bilaterally. The right hemidiaphragm is better demonstrated today. There remains partial obscuration of the left hemidiaphragm. The cardiac silhouette is top-normal in size. The pulmonary vascularity is indistinct. The  tracheostomy appliance tip projects between the clavicular heads. The esophagogastric tube tip projects below the inferior margin of the image. The left internal jugular venous catheter tip projects at the junction of the left subclavian vein with the left internal jugular vein. IMPRESSION: Slight interval improvement in bilateral interstitial and alveolar airspace disease. CHF is favor though coexisting pneumonia is not excluded. Stable support tube  positioning. Electronically Signed   By: David  [place] M.D.   On: 03/21/2015 07:30   Dg Chest Port 1 View  03/20/2015  CLINICAL DATA:  Tracheostomy care. EXAM: PORTABLE CHEST 1 VIEW COMPARISON:  Chest x-ray from earlier same day and chest x-ray dated 03/19/2015. FINDINGS: Interval placement of a tracheostomy tube with tip well positioned approximately 3 cm above the carina. Left IJ central line stable in position with tip likely in the left brachiocephalic vein. Enteric tube passes below the diaphragm. Cardiomediastinal silhouette is stable in size and configuration. Patchy bilateral airspace opacities appear slightly worsened and is most suggestive of pulmonary edema. Probable small bilateral pleural effusions are stable. No new lung findings. No pneumothorax seen. IMPRESSION: 1. Interval tracheostomy placement with tip well positioned approximately 3 cm above the carina. No evidence of procedural complicating feature. 2. Patchy bilateral airspace opacities, perhaps slightly worsened compared to the previous exams, most suggestive of pulmonary edema. 3. Probable small bilateral pleural effusions. Electronically Signed   By: Bary Richard M.D.   On: 03/20/2015 14:57   CULTURES: 2/28 bc x 2 negative 2/28 uc negative 2/28 RVP negative Sputum culture 3/4 >>yeast Blood culture 3/4 >>  ANTIBIOTICS: 2/28 vanc>> 2/28 cefepime>>  SIGNIFICANT EVENTS: 2/28 readmit to hospital  03/12/15 Intubated 3/1 Bronchoscopy   LINES/TUBES: Central line L IJ TLC>3/1  DISCUSSION: 16 autistic male recently in hospital for multilobar PNA and dc'd on 6 l [place] and returned in 24 hours with sats in 50%. Was intubated on 3/1 on full vent support, with increased O2 demands, hemodynamically unstable requiring pressors.  ASSESSMENT / PLAN:  PULMONARY A: Acute hypoxic resp failure. Suspected recurrent pna/HCAP/ asp pna.  Early ARDS Suspected undiagnosed OSA P:  On full vent.  FiO2 100% and PEEP to 12. Abx as  below. Trach done.  CARDIOVASCULAR A:  Septic Shock -- improved.  P:  Off pressors. Tachycardic 2 to fever and agitation.   RENAL Lab Results  Component Value Date   CREATININE 2.58* 03/21/2015   CREATININE 2.76* 03/20/2015   CREATININE 2.38* 03/19/2015  A:  Acute Kidney Injury Elevated but stable Na. P:  Trend BUN and creatinine. Creat stable.  Free water 250 ml q6 hours. D/C D5W. Lasix 5 mg/hr drip. Zaroxolyn 5 mg x1.  GASTROINTESTINAL A:  No active issues P:  Cont TF post trach. NGT.  HEMATOLOGIC A:  Hx of sickle cell trait VTE  P:  D/C lovenox. Start SQ heparin.  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 Cultures (-) so far.  P:  Continue Vanc/cefepime. Follow CXR. Cultures negative but moderate yeast in sputum. ID consult.  ENDOCRINE A:  hyperglycemia  P:  ISS ordered moderate.  NEUROLOGIC A:  Mentally impaired with limited vocabulary of 500 words.  Autism  Decreased LOC from metabolic disarray. P:  RASS goal: -1 Requires sedatives. On fantanyl drip and versed drips. Start Precedex drip.  Issue here is patient becomes afraid and agitated>asynchrony>sedation>IVF/pressors>pulmonary edema>more agitation and repeat.  Will attempt precedex and avoid versed/fentanyl.  The patient is critically ill with multiple organ systems failure and requires  high complexity decision making for assessment and support, frequent evaluation and titration of therapies, application of advanced monitoring technologies and extensive interpretation of multiple databases.   Critical Care Time devoted to patient care services described in this note is  35  Minutes. This time reflects time of care of this signee Dr Koren Bound. This critical care time does not reflect procedure time, or teaching time or supervisory time of PA/NP/Med student/Med Resident etc but could involve care discussion  time.  Alyson Reedy, M.D. [facility] Pulmonary/Critical Care Medicine. Pager: [phone]. After hours pager: [phone].  03/21/2015, 12:10 PM

## 2015-03-21 NOTE — Progress Notes (Signed)
QTc 0.42

## 2015-03-22 ENCOUNTER — Inpatient Hospital Stay (HOSPITAL_COMMUNITY): Payer: Medicare Other

## 2015-03-22 DIAGNOSIS — Y95 Nosocomial condition: Secondary | ICD-10-CM

## 2015-03-22 DIAGNOSIS — I959 Hypotension, unspecified: Secondary | ICD-10-CM

## 2015-03-22 LAB — CBC
HCT: 30.3 % — ABNORMAL LOW (ref 39.0–52.0)
Hemoglobin: 9.5 g/dL — ABNORMAL LOW (ref 13.0–17.0)
MCH: 25.7 pg — ABNORMAL LOW (ref 26.0–34.0)
MCHC: 31.4 g/dL (ref 30.0–36.0)
MCV: 82.1 fL (ref 78.0–100.0)
Platelets: 104 10*3/uL — ABNORMAL LOW (ref 150–400)
RBC: 3.69 MIL/uL — ABNORMAL LOW (ref 4.22–5.81)
RDW: 15.6 % — ABNORMAL HIGH (ref 11.5–15.5)
WBC: 16 10*3/uL — ABNORMAL HIGH (ref 4.0–10.5)

## 2015-03-22 LAB — BLOOD GAS, ARTERIAL
Acid-Base Excess: 1.4 mmol/L (ref 0.0–2.0)
Bicarbonate: 25.5 mEq/L — ABNORMAL HIGH (ref 20.0–24.0)
Drawn by: 23604
FIO2: 1
MECHVT: 460 mL
O2 Saturation: 94.4 %
PEEP: 12 cmH2O
Patient temperature: 98.6
RATE: 20 resp/min
TCO2: 26.8 mmol/L (ref 0–100)
pCO2 arterial: 40.4 mmHg (ref 35.0–45.0)
pH, Arterial: 7.416 (ref 7.350–7.450)
pO2, Arterial: 71.5 mmHg — ABNORMAL LOW (ref 80.0–100.0)

## 2015-03-22 LAB — POCT I-STAT 3, ART BLOOD GAS (G3+)
Acid-Base Excess: 4 mmol/L — ABNORMAL HIGH (ref 0.0–2.0)
Bicarbonate: 29.8 mEq/L — ABNORMAL HIGH (ref 20.0–24.0)
O2 SAT: 95 %
PCO2 ART: 53.9 mmHg — AB (ref 35.0–45.0)
PH ART: 7.356 (ref 7.350–7.450)
PO2 ART: 85 mmHg (ref 80.0–100.0)
Patient temperature: 100.7
TCO2: 31 mmol/L (ref 0–100)

## 2015-03-22 LAB — BASIC METABOLIC PANEL
ANION GAP: 12 (ref 5–15)
BUN: 62 mg/dL — AB (ref 6–20)
CO2: 26 mmol/L (ref 22–32)
CREATININE: 3.05 mg/dL — AB (ref 0.61–1.24)
Calcium: 8.6 mg/dL — ABNORMAL LOW (ref 8.9–10.3)
Chloride: 107 mmol/L (ref 101–111)
GFR calc non Af Amer: 25 mL/min — ABNORMAL LOW (ref >60)
GFR, EST AFRICAN AMERICAN: 28 mL/min — AB (ref >60)
Glucose, Bld: 185 mg/dL — ABNORMAL HIGH (ref 65–99)
Potassium: 3.7 mmol/L (ref 3.5–5.1)
SODIUM: 145 mmol/L (ref 135–145)

## 2015-03-22 LAB — URINE CULTURE: Culture: NO GROWTH

## 2015-03-22 LAB — PHOSPHORUS: PHOSPHORUS: 3.3 mg/dL (ref 2.5–4.6)

## 2015-03-22 LAB — GLUCOSE, CAPILLARY
Glucose-Capillary: 117 mg/dL — ABNORMAL HIGH (ref 65–99)
Glucose-Capillary: 117 mg/dL — ABNORMAL HIGH (ref 65–99)
Glucose-Capillary: 151 mg/dL — ABNORMAL HIGH (ref 65–99)
Glucose-Capillary: 162 mg/dL — ABNORMAL HIGH (ref 65–99)
Glucose-Capillary: 98 mg/dL (ref 65–99)

## 2015-03-22 LAB — TROPONIN I: Troponin I: 0.08 ng/mL — ABNORMAL HIGH (ref <0.031)

## 2015-03-22 LAB — LIPASE, BLOOD: LIPASE: 31 U/L (ref 11–51)

## 2015-03-22 LAB — MAGNESIUM: Magnesium: 2.4 mg/dL (ref 1.7–2.4)

## 2015-03-22 LAB — AMYLASE: Amylase: 77 U/L (ref 28–100)

## 2015-03-22 MED ORDER — FUROSEMIDE 10 MG/ML IJ SOLN: 5.0000 mg/h | INTRAVENOUS | Status: DC

## 2015-03-22 MED ORDER — VECURONIUM BROMIDE 10 MG IV SOLR: 5.0000 mg | Freq: Once | INTRAVENOUS | Status: AC

## 2015-03-22 MED ORDER — CISATRACURIUM BOLUS VIA INFUSION
0.0500 mg/kg | Freq: Once | INTRAVENOUS | Status: AC
  Administered 2015-03-22: 4.9 mg via INTRAVENOUS
  Filled 2015-03-22: qty 5

## 2015-03-22 MED ORDER — FUROSEMIDE 10 MG/ML IJ SOLN
80.0000 mg | Freq: Once | INTRAMUSCULAR | Status: AC
  Administered 2015-03-22: 80 mg via INTRAVENOUS
  Filled 2015-03-22: qty 8

## 2015-03-22 MED ORDER — ALBUMIN HUMAN 25 % IV SOLN
25.0000 g | Freq: Four times a day (QID) | INTRAVENOUS | Status: AC
  Administered 2015-03-22 – 2015-03-23 (×4): 25 g via INTRAVENOUS
  Filled 2015-03-22 (×2): qty 100
  Filled 2015-03-22: qty 50
  Filled 2015-03-22: qty 100

## 2015-03-22 MED ORDER — METOLAZONE 5 MG PO TABS
5.0000 mg | ORAL_TABLET | Freq: Every day | ORAL | Status: AC
  Administered 2015-03-22: 5 mg via ORAL
  Filled 2015-03-22: qty 1

## 2015-03-22 MED ORDER — NICARDIPINE HCL IN NACL 20-0.86 MG/200ML-% IV SOLN: 3.0000 mg/h | INTRAVENOUS | Status: DC

## 2015-03-22 MED ORDER — VECURONIUM BROMIDE 10 MG IV SOLR
INTRAVENOUS | Status: AC
  Administered 2015-03-22: 5 mg
  Filled 2015-03-22: qty 10

## 2015-03-22 MED ORDER — PROPOFOL 1000 MG/100ML IV EMUL
5.0000 ug/kg/min | INTRAVENOUS | Status: DC
  Administered 2015-03-22: 10 ug/kg/min via INTRAVENOUS
  Administered 2015-03-22: 35 ug/kg/min via INTRAVENOUS
  Administered 2015-03-24 – 2015-03-25 (×6): 30 ug/kg/min via INTRAVENOUS
  Filled 2015-03-22 (×13): qty 100

## 2015-03-22 MED ORDER — PHENYLEPHRINE HCL 10 MG/ML IJ SOLN
0.0000 ug/min | INTRAVENOUS | Status: DC
  Administered 2015-03-22: 150 ug/min via INTRAVENOUS
  Administered 2015-03-22: 40 ug/min via INTRAVENOUS
  Administered 2015-03-23: 50 ug/min via INTRAVENOUS
  Filled 2015-03-22 (×4): qty 4

## 2015-03-22 MED ORDER — MIDAZOLAM HCL 5 MG/ML IJ SOLN
2.0000 mg | Freq: Once | INTRAMUSCULAR | Status: AC
  Administered 2015-03-22: 2 mg via INTRAVENOUS

## 2015-03-22 MED ORDER — FENTANYL CITRATE (PF) 100 MCG/2ML IJ SOLN: 100.0000 ug | Freq: Once | INTRAMUSCULAR | Status: DC | PRN

## 2015-03-22 MED ORDER — VECURONIUM BROMIDE 10 MG IV SOLR
5.0000 mg | Freq: Once | INTRAVENOUS | Status: AC
  Administered 2015-03-22: 5 mg via INTRAVENOUS

## 2015-03-22 MED ORDER — FENTANYL BOLUS VIA INFUSION
50.0000 ug | INTRAVENOUS | Status: DC | PRN
  Filled 2015-03-22: qty 50

## 2015-03-22 MED ORDER — SODIUM CHLORIDE 0.9 % IV SOLN
3.0000 ug/kg/min | INTRAVENOUS | Status: DC
  Administered 2015-03-22 – 2015-03-25 (×3): 3 ug/kg/min via INTRAVENOUS
  Filled 2015-03-22 (×5): qty 20

## 2015-03-22 MED ORDER — FENTANYL CITRATE (PF) 2500 MCG/50ML IJ SOLN
25.0000 ug/h | INTRAMUSCULAR | Status: DC
  Administered 2015-03-22: 100 ug/h via INTRAVENOUS
  Administered 2015-03-23: 250 ug/h via INTRAVENOUS
  Administered 2015-03-25: 175 ug/h via INTRAVENOUS
  Filled 2015-03-22 (×5): qty 50

## 2015-03-22 MED ORDER — SODIUM CHLORIDE 0.9 % IV SOLN: INTRAVENOUS | Status: DC | PRN

## 2015-03-22 MED ORDER — VECURONIUM BROMIDE 10 MG IV SOLR
INTRAVENOUS | Status: AC
  Filled 2015-03-22: qty 10

## 2015-03-22 MED ORDER — ARTIFICIAL TEARS OP OINT
1.0000 | TOPICAL_OINTMENT | Freq: Three times a day (TID) | OPHTHALMIC | Status: DC
Start: 2015-03-22 — End: 2015-03-25
  Administered 2015-03-22 – 2015-03-25 (×8): 1 via OPHTHALMIC

## 2015-03-22 MED ORDER — FENTANYL CITRATE (PF) 100 MCG/2ML IJ SOLN: 100.0000 ug | Freq: Once | INTRAMUSCULAR | Status: DC

## 2015-03-22 MED ORDER — PHENYLEPHRINE HCL 10 MG/ML IJ SOLN
0.0000 ug/min | INTRAMUSCULAR | Status: DC
  Administered 2015-03-22: 20 ug/min via INTRAVENOUS
  Filled 2015-03-22 (×3): qty 1

## 2015-03-22 MED ORDER — FUROSEMIDE 10 MG/ML IJ SOLN
5.0000 mg/h | INTRAVENOUS | Status: DC
  Administered 2015-03-23: 5 mg/h via INTRAVENOUS
  Filled 2015-03-22 (×2): qty 25

## 2015-03-22 MED ORDER — NOREPINEPHRINE BITARTRATE 1 MG/ML IV SOLN
0.0000 ug/min | INTRAVENOUS | Status: DC
  Administered 2015-03-22 – 2015-03-23 (×2): 6 ug/min via INTRAVENOUS
  Filled 2015-03-22 (×2): qty 4

## 2015-03-22 NOTE — Progress Notes (Signed)
eLink Physician-Brief Progress Note Patient Name: Juan RipperRobert Cameron DOB: 1975-12-30 MRN: [id number]   Date of Service  03/22/2015  HPI/Events of Note  Ppeak = 52 on TV = 460 and RR = 20  eICU Interventions  Will decrease TV to 400 and increase RR to 23. (Ppeak decreased to 48)     Intervention Category Major Interventions: Respiratory failure - evaluation and management  Juan Cameron Eugene 03/22/2015, 8:54 PM

## 2015-03-22 NOTE — Progress Notes (Signed)
eLink Physician-Brief Progress Note Patient Name: Juan RipperRobert Cameron DOB: [birth year] MRN: [id number]   Date of Service  03/22/2015  HPI/Events of Note  Hypoxemia - Patient is on 100%/PRVC 20/TV 450/P 12 >> RR = 40 and Sat = 86%. Review of AM CXR >> large gastric air bubble and pulmonary congestion.  eICU Interventions  Will order: 1. NGT to LIS. 2. Portable CXR STAT. 3. Increase PEEP to 14.  4. Versed 2 mg IV now. 5. Restart Precedex IV infusion.  6. Lasix 80 mg IV now.      Intervention Category Major Interventions: Hypoxemia - evaluation and management  [name],Steven Eugene 03/22/2015, 4:03 PM

## 2015-03-22 NOTE — Progress Notes (Addendum)
INFECTIOUS DISEASE PROGRESS NOTE  ID: Juan [place] is a 40 y.o. male with  Principal Problem:   Acute respiratory failure with hypoxia (HCC) Active Problems:   Autism   Acute lung injury   HCAP (healthcare-associated pneumonia)   Leukocytosis   Acute hyperglycemia   Left ventricular diastolic dysfunction, NYHA class 2   Thrombocytopenia (HCC)   ARDS (adult respiratory distress syndrome) (HCC)   Fever   Acute renal insufficiency   Obesity  Subjective: Hypotensive, high fevers this AM On cooling blanket (now temp 103.4) Formed BM this AM  Abtx:  Anti-infectives    Start     Dose/Rate Route Frequency Ordered Stop   03/20/15 1800  vancomycin (VANCOCIN) IVPB 750 mg/150 ml premix  Status:  Discontinued     750 mg 150 mL/hr over 60 Minutes Intravenous Every 24 hours 03/19/15 0225 03/21/15 1437   03/20/15 1400  ceFEPIme (MAXIPIME) 2 g in dextrose 5 % 50 mL IVPB  Status:  Discontinued     2 g 100 mL/hr over 30 Minutes Intravenous Every 12 hours 03/20/15 1311 03/21/15 1437   03/16/15 1400  vancomycin (VANCOCIN) IVPB 1000 mg/200 mL premix  Status:  Discontinued     1,000 mg 200 mL/hr over 60 Minutes Intravenous Every 12 hours 03/16/15 1251 03/19/15 0225   03/13/15 0000  vancomycin (VANCOCIN) IVPB 750 mg/150 ml premix     750 mg 150 mL/hr over 60 Minutes Intravenous Every 12 hours 03/12/15 1910 03/15/15 1349   03/11/15 1800  vancomycin (VANCOCIN) IVPB 1000 mg/200 mL premix  Status:  Discontinued     1,000 mg 200 mL/hr over 60 Minutes Intravenous Every 8 hours 03/11/15 0911 03/12/15 1910   03/11/15 0930  vancomycin (VANCOCIN) 2,000 mg in sodium chloride 0.9 % 500 mL IVPB     2,000 mg 250 mL/hr over 120 Minutes Intravenous  Once 03/11/15 0908 03/11/15 1138   03/11/15 0915  ceFEPIme (MAXIPIME) 1 g in dextrose 5 % 50 mL IVPB  Status:  Discontinued     1 g 100 mL/hr over 30 Minutes Intravenous 3 times per day 03/11/15 0843 03/11/15 0907   03/11/15 0915  ceFEPIme (MAXIPIME) 2 g in  dextrose 5 % 50 mL IVPB     2 g 100 mL/hr over 30 Minutes Intravenous 3 times per day 03/11/15 0907 03/18/15 2202      Medications:  Scheduled: . albumin human  25 g Intravenous Q6H  . antiseptic oral rinse  7 mL Mouth Rinse QID  . chlorhexidine gluconate  15 mL Mouth Rinse BID  . feeding supplement (PRO-STAT SUGAR FREE 64)  30 mL Per Tube BID  . feeding supplement (VITAL HIGH PROTEIN)  1,000 mL Per Tube Q24H  . fentaNYL (SUBLIMAZE) injection  200 mcg Intravenous Once  . free water  250 mL Per Tube Q6H  . heparin subcutaneous  5,000 Units Subcutaneous 3 times per day  . insulin aspart  2-6 Units Subcutaneous 6 times per day  . ipratropium-albuterol  3 mL Nebulization Q4H  . metolazone  5 mg Oral Daily  . midazolam  4 mg Intravenous Once  . pantoprazole sodium  40 mg Per Tube Q1200  . propofol  5-80 mcg/kg/min Intravenous Once  . QUEtiapine  200 mg Oral BID  . sodium chloride flush  3 mL Intravenous Q12H    Objective: Vital signs in last 24 hours: Temp:  [100.5 F (38.1 C)-105 F (40.6 C)] 105 F (40.6 C) (03/11 0847) Pulse Rate:  [95-153] 138 (03/11  0910) Resp:  [22-49] 33 (03/11 0910) BP: (69-156)/(38-104) 101/46 mmHg (03/11 0910) SpO2:  [86 %-97 %] 94 % (03/11 0910) FiO2 (%):  [40 %-100 %] 40 % (03/11 0828)   General appearance: no distress Neck: L neck IJ dressed.  Resp: diminished breath sounds bilaterally and rhonchi bilaterally Cardio: tachycardia GI: normal findings: soft, non-tender and abnormal findings:  distended and hypoactive bowel sounds Extremities: edema none  Lab Results  Recent Labs  03/21/15 0515 03/22/15 0400  WBC 17.2* 16.0*  HGB 9.3* 9.5*  HCT 29.0* 30.3*  NA 149* 145  K 4.5 3.7  CL 118* 107  CO2 25 26  BUN 53* 62*  CREATININE 2.58* 3.05*   Liver Panel No results for input(s): PROT, ALBUMIN, AST, ALT, ALKPHOS, BILITOT, BILIDIR, IBILI in the last 72 hours. Sedimentation Rate No results for input(s): ESRSEDRATE in the last 72  hours. C-Reactive Protein No results for input(s): CRP in the last 72 hours.  Microbiology: Recent Results (from the past 240 hour(s))  Culture, respiratory (NON-Expectorated)     Status: None   Collection Time: 03/12/15 12:29 PM  Result Value Ref Range Status   Specimen Description BRONCHIAL ALVEOLAR LAVAGE  Final   Special Requests LLL  Final   Gram Stain   Final    NO WBC SEEN NO SQUAMOUS EPITHELIAL CELLS SEEN NO ORGANISMS SEEN Performed at [facility]    Culture   Final    NO GROWTH 2 DAYS Performed at [facility]    Report Status 03/15/2015 FINAL  Final  Culture, blood (routine x 2)     Status: None   Collection Time: 03/15/15  2:45 PM  Result Value Ref Range Status   Specimen Description BLOOD RIGHT ANTECUBITAL  Final   Special Requests BOTTLES DRAWN AEROBIC AND ANAEROBIC 10CC  Final   Culture NO GROWTH 5 DAYS  Final   Report Status 03/20/2015 FINAL  Final  Culture, blood (routine x 2)     Status: None   Collection Time: 03/15/15  3:00 PM  Result Value Ref Range Status   Specimen Description BLOOD BLOOD RIGHT HAND  Final   Special Requests BOTTLES DRAWN AEROBIC AND ANAEROBIC 10CC  Final   Culture NO GROWTH 5 DAYS  Final   Report Status 03/20/2015 FINAL  Final  Culture, respiratory (NON-Expectorated)     Status: None   Collection Time: 03/15/15  3:10 PM  Result Value Ref Range Status   Specimen Description SPUTUM  Final   Special Requests NONE  Final   Gram Stain   Final    FEW WBC PRESENT,BOTH PMN AND MONONUCLEAR NO SQUAMOUS EPITHELIAL CELLS SEEN RARE YEAST THIS SPECIMEN IS ACCEPTABLE FOR SPUTUM CULTURE Performed at [facility]    Culture   Final    MODERATE YEAST Performed at [facility]    Report Status 03/18/2015 FINAL  Final  Gram stain     Status: None   Collection Time: 03/16/15 11:28 AM  Result Value Ref Range Status   Specimen Description TRACHEAL SITE  Final   Special Requests NONE  Final   Gram Stain   Final     FEW WBC PRESENT,BOTH PMN AND MONONUCLEAR FEW YEAST    Report Status 03/16/2015 FINAL  Final  Culture, respiratory (NON-Expectorated)     Status: None   Collection Time: 03/16/15 11:28 AM  Result Value Ref Range Status   Specimen Description TRACHEAL ASPIRATE  Final   Special Requests Normal  Final   Gram  Stain   Final    FEW WBC PRESENT,BOTH PMN AND MONONUCLEAR FEW YEAST Performed at [facility] Performed at [facility]    Culture   Final    MODERATE YEAST Performed at [facility]    Report Status 03/18/2015 FINAL  Final  Culture, respiratory (NON-Expectorated)     Status: None (Preliminary result)   Collection Time: 03/20/15  3:08 PM  Result Value Ref Range Status   Specimen Description BRONCHIAL ALVEOLAR LAVAGE  Final   Special Requests NONE  Final   Gram Stain   Final    MODERATE WBC PRESENT, PREDOMINANTLY PMN NO SQUAMOUS EPITHELIAL CELLS SEEN NO ORGANISMS SEEN Performed at [facility]    Culture   Final    NO GROWTH 1 DAY Performed at [facility]    Report Status PENDING  Incomplete    Studies/Results: Dg Chest Port 1 View  03/22/2015  CLINICAL DATA:  ETT evaluation EXAM: PORTABLE CHEST 1 VIEW COMPARISON:  March 21, 2015 FINDINGS: The tracheostomy tube is in good position. An NG tube terminates below today's film. The left IJ is stable. No pneumothorax. Mild pulmonary opacities are stable. IMPRESSION: Appropriate placement of support apparatus. Stable pulmonary opacities favored to represent edema. Electronically Signed   By: Gerome Samavid  Williams III M.D   On: 03/22/2015 08:11   Dg Chest Port 1 View  03/21/2015  CLINICAL DATA:  Acute respiratory failure, pneumonia, ARDS, left ventricular diastolic dysfunction. EXAM: PORTABLE CHEST 1 VIEW COMPARISON:  Portable chest x-ray dated March 20, 2015 FINDINGS: The lungs are mildly hypoinflated. The interstitial markings remain coarse bilaterally. The right hemidiaphragm is better  demonstrated today. There remains partial obscuration of the left hemidiaphragm. The cardiac silhouette is top-normal in size. The pulmonary vascularity is indistinct. The tracheostomy appliance tip projects between the clavicular heads. The esophagogastric tube tip projects below the inferior margin of the image. The left internal jugular venous catheter tip projects at the junction of the left subclavian vein with the left internal jugular vein. IMPRESSION: Slight interval improvement in bilateral interstitial and alveolar airspace disease. CHF is favor though coexisting pneumonia is not excluded. Stable support tube positioning. Electronically Signed   By: David  Swazil[place]Jordan M.D.   On: 03/21/2015 07:30   Dg Chest Port 1 View  03/20/2015  CLINICAL DATA:  Tracheostomy care. EXAM: PORTABLE CHEST 1 VIEW COMPARISON:  Chest x-ray from earlier same day and chest x-ray dated 03/19/2015. FINDINGS: Interval placement of a tracheostomy tube with tip well positioned approximately 3 cm above the carina. Left IJ central line stable in position with tip likely in the left brachiocephalic vein. Enteric tube passes below the diaphragm. Cardiomediastinal silhouette is stable in size and configuration. Patchy bilateral airspace opacities appear slightly worsened and is most suggestive of pulmonary edema. Probable small bilateral pleural effusions are stable. No new lung findings. No pneumothorax seen. IMPRESSION: 1. Interval tracheostomy placement with tip well positioned approximately 3 cm above the carina. No evidence of procedural complicating feature. 2. Patchy bilateral airspace opacities, perhaps slightly worsened compared to the previous exams, most suggestive of pulmonary edema. 3. Probable small bilateral pleural effusions. Electronically Signed   By: Bary RichardStan  Maynard M.D.   On: 03/20/2015 14:57     Assessment/Plan: FUO Pneumonia (BAL 3-1 (-)) Respiratory Virus Panel 2-28 (-) Fluid Overload VDRF, ARDS ARF Autism  He  has been treated for HCAP, aspiration (> 8 days) On pressors Cr worsened Repeat BCx from yesterday are pending.  Will continue to  hold anbx, would consider resuming cefepime if fever persists? Check amylase, lipase F/u imaging Family updated  Total days of antibiotics: vanco/cefepime (11 days, on hold last 24h)         Johny Sax Infectious Diseases (pager) [phone] [email or web address] 03/22/2015, 11:11 AM  LOS: 11 days

## 2015-03-22 NOTE — Progress Notes (Signed)
eLink Physician-Brief Progress Note Patient Name: Alan RipperRobert Oley DOB: [birth year] MRN: [id number]   Date of Service  03/22/2015  HPI/Events of Note  Norcuron has worn off. Sat back in low to mid 80's.  eICU Interventions  Will NMB with Nimbex IV infusion.      Intervention Category Major Interventions: Hypoxemia - evaluation and management  Hamzeh Tall Eugene 03/22/2015, 6:08 PM

## 2015-03-22 NOTE — Progress Notes (Signed)
Temp 105, SBP 89.  Dr. Molli KnockYacoub made aware, cooling blanket ordered, Levophed increased

## 2015-03-22 NOTE — Progress Notes (Signed)
PULMONARY / CRITICAL CARE MEDICINE   Name: Juan [place] MRN: [id number] DOB: 1975/10/30    ADMISSION DATE:  03/11/2015  CONSULTATION DATE: 2/28  REFERRING MD: [facility]  CHIEF COMPLAINT: resp failure  HISTORY OF PRESENT ILLNESS:  [age] autistic male recently in [place] for multilobar pneumonia and dc'd on 6 l [place] and returned in 24 hours with sats in 50%. He was discharged on 03/10/15 after treatment for CAP, NOI, and was on 6 l [place]. [name](HCPOA) checked sats and he was 50% and therefore [facility] and required 80% fio2 on NIVMS with RR 45. CXR consistent early ARDS, 2d echo with nl EF but grade 2 dysfunction. PCCM called to bedside on 3/1 with anticipation of requiring to be intubated.  SUBJECTIVE:  Very agitated on vent, desaturating and asynchronous with the vent.  VITAL SIGNS: BP 101/46 mmHg  Pulse 138  Temp(Src) 105 F (40.6 C) (Rectal)  Resp 33  Ht 5\' 7"  (1.702 m)  Wt 98.4 kg (216 lb 14.9 oz)  BMI 33.97 kg/m2  SpO2 94%  HEMODYNAMICS:    VENTILATOR SETTINGS: Vent Mode:  [-] PRVC FiO2 (%):  [40 %-100 %] 40 % Set Rate:  [20 bmp] 20 bmp Vt Set:  [460 mL] 460 mL PEEP:  [12 cmH20] 12 cmH20 Plateau Pressure:  [23 cmH20-37 cmH20] 23 cmH20  INTAKE / OUTPUT: I/O last 3 completed shifts: In: 5778.6 [I.V.:2478.6; NG/GT:3200; IV Piggyback:100] Out: 5700 [Urine:5700]  PHYSICAL EXAMINATION: General: Awake. Moves all ext Neuro: Withdraws all ext to pain but not command. HEENT: [place]/AT, PERRL, EOM-I and MMM. Cardiovascular: RRR, Nl S1/S2, no MRG Lungs: Diminshed throughout. Abdomen: Obese , protrubent, hypoactive bowel sounds. Musculoskeletal: intact Skin: Warm, moist  LABS:  BMET  Recent Labs Lab 03/20/15 0354 03/21/15 0515 03/22/15 0400  NA 154* 149* 145  K 3.9 4.5 3.7  CL 118* 118* 107  CO2 24 25 26   BUN 57* 53* 62*  CREATININE 2.76* 2.58* 3.05*  GLUCOSE 125* 121* 185*   Electrolytes  Recent Labs Lab 03/20/15 0354 03/21/15 0515  03/22/15 0400  CALCIUM 8.2* 8.2* 8.6*  MG 2.6* 2.6* 2.4  PHOS 3.4 2.5 3.3   CBC  Recent Labs Lab 03/20/15 0354 03/21/15 0515 03/22/15 0400  WBC 13.7* 17.2* 16.0*  HGB 10.2* 9.3* 9.5*  HCT 31.8* 29.0* 30.3*  PLT 100* 113* 104*   Coag's  Recent Labs Lab 03/20/15 0354  APTT 33  INR 1.37   Sepsis Markers No results for input(s): LATICACIDVEN, PROCALCITON, O2SATVEN in the last 168 hours. ABG  Recent Labs Lab 03/20/15 0410 03/21/15 0331 03/22/15 0420  PHART 7.392 7.292* 7.416  PCO2ART 42.5 54.6* 40.4  PO2ART 86.8 80.3 71.5*   Liver Enzymes No results for input(s): AST, ALT, ALKPHOS, BILITOT, ALBUMIN in the last 168 hours.  Cardiac Enzymes No results for input(s): TROPONINI, PROBNP in the last 168 hours.  Glucose  Recent Labs Lab 03/21/15 1200 03/21/15 1520 03/21/15 2025 03/22/15 03/22/15 0353 03/22/15 0850  GLUCAP 141* 154* 145* 117* 151* 98   Imaging Dg Chest Port 1 View  03/22/2015  CLINICAL DATA:  ETT evaluation EXAM: PORTABLE CHEST 1 VIEW COMPARISON:  March 21, 2015 FINDINGS: The tracheostomy tube is in good position. An NG tube terminates below today's film. The left IJ is stable. No pneumothorax. Mild pulmonary opacities are stable. IMPRESSION: Appropriate placement of support apparatus. Stable pulmonary opacities favored to represent edema. Electronically Signed   By: Gerome Samavid  Williams III M.D   On: 03/22/2015 08:11  CULTURES: 2/28 bc x 2 negative 2/28 uc negative 2/28 RVP negative Sputum culture 3/4 >>yeast Blood culture 3/4 >>  ANTIBIOTICS: 2/28 vanc>>3/10 2/28 cefepime>>3/10  SIGNIFICANT EVENTS: 2/28 readmit to [place]  03/12/15 Intubated 3/1 Bronchoscopy   LINES/TUBES: Central line L IJ TLC>3/1 ETT out Trach 3/9>>>  DISCUSSION: 36 autistic male recently in [place] for multilobar PNA and dc'd on 6 l [place] and returned in 24 hours with sats in 50%. Was intubated on 3/1 on full vent support, with increased O2 demands, hemodynamically  unstable requiring pressors.  ASSESSMENT / PLAN:  PULMONARY A: Acute hypoxic resp failure. Suspected recurrent pna/HCAP/ asp pna.  Early ARDS Suspected undiagnosed OSA P:  On full vent.  FiO2 100% and PEEP to 12. Abx as below. Trach done.  CARDIOVASCULAR A:  Septic Shock -- improved.  P:  Off pressors. Tachycardic 2 to fever and agitation.  RENAL Lab Results  Component Value Date   CREATININE 3.05* 03/22/2015   CREATININE 2.58* 03/21/2015   CREATININE 2.76* 03/20/2015  A:  Acute Kidney Injury Elevated but stable Na. P:  Trend BUN and creatinine. Creat stable. Free water 250 ml q6 hours. D/C D5W. Lasix 5 mg/hr drip. Zaroxolyn 5 mg x1.  GASTROINTESTINAL A:  No active issues P:  Cont TF. NGT.  HEMATOLOGIC A:  Hx of sickle cell trait VTE  P:  D/C lovenox. Continue SQ heparin.  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 Cultures (-) so far.  P:  D/Ced Vanc/cefepime 3/10. Follow CXR. Cultures negative but moderate yeast in sputum. ID consult appreciate.  ENDOCRINE A:  hyperglycemia  P:  ISS ordered moderate.  NEUROLOGIC A:  Mentally impaired with limited vocabulary of 500 words.  Autism  Decreased LOC from metabolic disarray. P:  RASS goal: -1 Requires sedatives. Off fantanyl drip and versed drips. Minimize Precedex drip as able.  Issue here is patient becomes afraid and agitated>asynchrony>sedation>IVF/pressors>pulmonary edema>more agitation and repeat.  Will attempt precedex and avoid versed/fentanyl.  The patient is critically ill with multiple organ systems failure and requires high complexity decision making for assessment and support, frequent evaluation and titration of therapies, application of advanced monitoring technologies and extensive interpretation of multiple databases.   Critical Care Time devoted to patient care services described in this note is  35   Minutes. This time reflects time of care of this signee Dr Koren Bound. This critical care time does not reflect procedure time, or teaching time or supervisory time of PA/NP/Med student/Med Resident etc but could involve care discussion time.  Alyson Reedy, M.D. [facility] [place] Pulmonary/Critical Care Medicine. Pager: [phone]. After hours pager: [phone].  03/22/2015, 9:57 AM

## 2015-03-22 NOTE — Progress Notes (Signed)
eLink Physician-Brief Progress Note Patient Name: Juan RipperRobert Mauger DOB: 1975-07-10 MRN: [id number]   Date of Service  03/22/2015  HPI/Events of Note  Still hypoxic - Sat = 87%. Review of CXR >> pulmonary congestion. Good diuresis with Lasix 80 mg IV.   eICU Interventions  Will order: 1. Increase PEEP to 16. 2. Vecuronium 5 mg IV X 1 now.  3. Cycle troponin.     Intervention Category Major Interventions: Hypoxemia - evaluation and management  [name],Steven [place] 03/22/2015, 4:50 PM

## 2015-03-22 NOTE — Procedures (Signed)
Arterial Catheter Insertion Procedure Note Alan RipperRobert Cape [id number] [birth year]  Procedure: Insertion of Arterial Catheter  Indications: Blood pressure monitoring and Frequent blood sampling  Procedure Details Consent: Risks of procedure as well as the alternatives and risks of each were explained to the (patient/caregiver).  Consent for procedure obtained. Time Out: Verified patient identification, verified procedure, site/side was marked, verified correct patient position, special equipment/implants available, medications/allergies/relevent history reviewed, required imaging and test results available.  Performed  Maximum sterile technique was used including antiseptics, cap, gloves, gown, hand hygiene, mask and sheet. Skin prep: Chlorhexidine; local anesthetic administered 20 gauge catheter was inserted into right radial artery using the Seldinger technique.  Evaluation Blood flow good; BP tracing good. Complications: No apparent complications.   Antoine Pocherogdon, Demaris Bousquet Caroline 03/22/2015

## 2015-03-22 NOTE — Progress Notes (Signed)
eLink Physician-Brief Progress Note Patient Name: Juan RipperRobert [place] DOB: 11-Oct-1975 MRN: [id number]   Date of Service  03/22/2015  HPI/Events of Note  Hypertension - BP = 217/81.   eICU Interventions  Will start on a Nicardipine IV infusion.      Intervention Category Major Interventions: Hypertension - evaluation and management  [name],Steven Eugene 03/22/2015, 5:05 PM

## 2015-03-23 ENCOUNTER — Inpatient Hospital Stay (HOSPITAL_COMMUNITY): Payer: Medicare Other

## 2015-03-23 DIAGNOSIS — N289 Disorder of kidney and ureter, unspecified: Secondary | ICD-10-CM

## 2015-03-23 DIAGNOSIS — E8770 Fluid overload, unspecified: Secondary | ICD-10-CM

## 2015-03-23 LAB — GLUCOSE, CAPILLARY
GLUCOSE-CAPILLARY: 108 mg/dL — AB (ref 65–99)
GLUCOSE-CAPILLARY: 109 mg/dL — AB (ref 65–99)
GLUCOSE-CAPILLARY: 116 mg/dL — AB (ref 65–99)
GLUCOSE-CAPILLARY: 130 mg/dL — AB (ref 65–99)
Glucose-Capillary: 133 mg/dL — ABNORMAL HIGH (ref 65–99)
Glucose-Capillary: 99 mg/dL (ref 65–99)
Glucose-Capillary: 110 mg/dL — ABNORMAL HIGH (ref 65–99)

## 2015-03-23 LAB — CULTURE, RESPIRATORY

## 2015-03-23 LAB — BASIC METABOLIC PANEL
Anion gap: 17 — ABNORMAL HIGH (ref 5–15)
BUN: 74 mg/dL — AB (ref 6–20)
CHLORIDE: 99 mmol/L — AB (ref 101–111)
CO2: 28 mmol/L (ref 22–32)
CREATININE: 3.51 mg/dL — AB (ref 0.61–1.24)
Calcium: 8.7 mg/dL — ABNORMAL LOW (ref 8.9–10.3)
GFR calc Af Amer: 24 mL/min — ABNORMAL LOW (ref >60)
GFR calc non Af Amer: 21 mL/min — ABNORMAL LOW (ref >60)
GLUCOSE: 149 mg/dL — AB (ref 65–99)
Potassium: 3.5 mmol/L (ref 3.5–5.1)
Sodium: 144 mmol/L (ref 135–145)

## 2015-03-23 LAB — TROPONIN I
TROPONIN I: 0.07 ng/mL — AB (ref <0.031)
Troponin I: 0.11 ng/mL — ABNORMAL HIGH (ref <0.031)

## 2015-03-23 LAB — CBC
HEMATOCRIT: 28.4 % — AB (ref 39.0–52.0)
HEMOGLOBIN: 9.1 g/dL — AB (ref 13.0–17.0)
MCH: 26.1 pg (ref 26.0–34.0)
MCHC: 32 g/dL (ref 30.0–36.0)
MCV: 81.6 fL (ref 78.0–100.0)
Platelets: 119 10*3/uL — ABNORMAL LOW (ref 150–400)
RBC: 3.48 MIL/uL — AB (ref 4.22–5.81)
RDW: 15.4 % (ref 11.5–15.5)
WBC: 19.5 10*3/uL — AB (ref 4.0–10.5)

## 2015-03-23 LAB — PHOSPHORUS: Phosphorus: 8.3 mg/dL — ABNORMAL HIGH (ref 2.5–4.6)

## 2015-03-23 LAB — CULTURE, RESPIRATORY W GRAM STAIN

## 2015-03-23 LAB — MAGNESIUM: MAGNESIUM: 2.4 mg/dL (ref 1.7–2.4)

## 2015-03-23 MED ORDER — POTASSIUM CHLORIDE 10 MEQ/50ML IV SOLN
10.0000 meq | INTRAVENOUS | Status: AC
  Administered 2015-03-23 (×2): 10 meq via INTRAVENOUS
  Filled 2015-03-23 (×2): qty 50

## 2015-03-23 MED ORDER — FUROSEMIDE 10 MG/ML IJ SOLN
5.0000 mg/h | INTRAVENOUS | Status: DC
  Administered 2015-03-23: 5 mg/h via INTRAVENOUS
  Filled 2015-03-23: qty 25

## 2015-03-23 MED ORDER — METOLAZONE 5 MG PO TABS
5.0000 mg | ORAL_TABLET | Freq: Every day | ORAL | Status: AC
  Administered 2015-03-23: 5 mg via ORAL
  Filled 2015-03-23: qty 1

## 2015-03-23 NOTE — Progress Notes (Addendum)
PULMONARY / CRITICAL CARE MEDICINE   Name: Juan RipperRobert Cameron MRN: [id number] DOB: 01/25/1975    ADMISSION DATE:  03/11/2015  CONSULTATION DATE: 2/28  REFERRING MD: [facility]ad  CHIEF COMPLAINT: resp failure  HISTORY OF PRESENT ILLNESS:  [id number] autistic male recently in hospital for multilobar pneumonia and dc'd on 6 l [place] and returned in 24 hours with sats in 50%. He was discharged on 03/10/15 after treatment for CAP, NOI, and was on 6 l [place]. [facility]) checked sats and he was 50% and therefore was [facility] to [facility] and required 80% fio2 on NIVMS with RR 45. CXR consistent early ARDS, 2d echo with nl EF but grade 2 dysfunction. PCCM called to bedside on 3/1 with anticipation of requiring to be intubated.  SUBJECTIVE:  Very agitated and desated overnight, required sedation and paralytics.  VITAL SIGNS: BP 138/78 mmHg  Pulse 112  Temp(Src) 97.5 F (36.4 C) (Rectal)  Resp 23  Ht 5\' 7"  (1.702 m)  Wt 98.4 kg (216 lb 14.9 oz)  BMI 33.97 kg/m2  SpO2 99%  HEMODYNAMICS:    VENTILATOR SETTINGS: Vent Mode:  [-] PRVC FiO2 (%):  [100 %] 100 % Set Rate:  [20 bmp-23 bmp] 23 bmp Vt Set:  [400 mL-460 mL] 400 mL PEEP:  [12 cmH20-16 cmH20] 16 cmH20 Plateau Pressure:  [19 cmH20-42 cmH20] 26 cmH20  INTAKE / OUTPUT: I/O last 3 completed shifts: In: 3675.6 [I.V.:2475.6; NG/GT:900; IV Piggyback:300] Out: 7200 [Urine:7200]  PHYSICAL EXAMINATION: General: Sedated and paralyzed. Neuro: Sedated and paralyzed. HEENT: [place]/AT and MMM. Cardiovascular: RRR, Nl S1/S2, no MRG Lungs: Diminshed throughout. Abdomen: Obese , protrubent, hypoactive bowel sounds. Musculoskeletal: intact Skin: Warm, moist  LABS:  BMET  Recent Labs Lab 03/21/15 0515 03/22/15 0400 03/23/15 0418  NA 149* 145 144  K 4.5 3.7 3.5  CL 118* 107 99*  CO2 25 26 28   BUN 53* 62* 74*  CREATININE 2.58* 3.05* 3.51*  GLUCOSE 121* 185* 149*   Electrolytes  Recent Labs Lab 03/21/15 0515 03/22/15 0400  03/23/15 0418  CALCIUM 8.2* 8.6* 8.7*  MG 2.6* 2.4 2.4  PHOS 2.5 3.3 8.3*   CBC  Recent Labs Lab 03/21/15 0515 03/22/15 0400 03/23/15 0418  WBC 17.2* 16.0* 19.5*  HGB 9.3* 9.5* 9.1*  HCT 29.0* 30.3* 28.4*  PLT 113* 104* 119*   Coag's  Recent Labs Lab 03/20/15 0354  APTT 33  INR 1.37   Sepsis Markers No results for input(s): LATICACIDVEN, PROCALCITON, O2SATVEN in the last 168 hours. ABG  Recent Labs Lab 03/21/15 0331 03/22/15 0420 03/22/15 1726  PHART 7.292* 7.416 7.356  PCO2ART 54.6* 40.4 53.9*  PO2ART 80.3 71.5* 85.0   Liver Enzymes No results for input(s): AST, ALT, ALKPHOS, BILITOT, ALBUMIN in the last 168 hours.  Cardiac Enzymes  Recent Labs Lab 03/22/15 1725 03/22/15 2304 03/23/15 0418  TROPONINI 0.08* 0.11* 0.07*   Glucose  Recent Labs Lab 03/22/15 1648 03/22/15 1939 03/23/15 0008 03/23/15 0417 03/23/15 0904 03/23/15 1155  GLUCAP 117* 116* 130* 133* 99 110*   Imaging Dg Chest Port 1 View  03/23/2015  CLINICAL DATA:  Respiratory failure EXAM: PORTABLE CHEST 1 VIEW COMPARISON:  03/22/2015 FINDINGS: Pulmonary vascular congestion with mild interstitial edema. Mild patchy bilateral lower lobe opacities, likely atelectasis, pneumonia not excluded. Possible small left pleural effusion. No pneumothorax. Tracheostomy in satisfactory position. The heart is normal in size. Left IJ venous catheter in the left brachiocephalic vein. Enteric tube terminates in the stomach. IMPRESSION: Pulmonary vascular congestion with mild interstitial edema  and possible small left pleural effusion, grossly unchanged. Mild patchy bilateral lower lobe opacities, likely atelectasis, pneumonia not excluded. Electronically Signed   By: Charline Bills M.D.   On: 03/23/2015 07:32   Dg Chest Port 1 View  03/22/2015  CLINICAL DATA:  Hypoxia.  Acute respiratory failure. EXAM: PORTABLE CHEST 1 VIEW COMPARISON:  03/21/2015 FINDINGS: 1610 hours. Tracheostomy tube again noted. The NG  tube passes into the stomach although the distal tip position is not included on the film. Lordotic rotated film. Cardiopericardial silhouette is at upper limits of normal for size. Vascular congestion and basilar airspace disease persists. Telemetry leads overlie the chest. IMPRESSION: Low volume film with features suggesting pulmonary edema. No substantial interval change. Electronically Signed   By: Kennith Center M.D.   On: 03/22/2015 16:56   CULTURES: 2/28 bc x 2 negative 2/28 uc negative 2/28 RVP negative Sputum culture 3/4 >>yeast Blood culture 3/4 >>  ANTIBIOTICS: 2/28 vanc>>3/10 2/28 cefepime>>3/10  SIGNIFICANT EVENTS: 2/28 readmit to hospital  03/12/15 Intubated 3/1 Bronchoscopy   LINES/TUBES: Central line L IJ TLC>3/1 ETT out Trach 3/9>>>  DISCUSSION: 45 autistic male recently in hospital for multilobar PNA and dc'd on 6 l [place] and returned in 24 hours with sats in 50%. Was intubated on 3/1 on full vent support, with increased O2 demands, hemodynamically unstable requiring pressors.  ASSESSMENT / PLAN:  PULMONARY A: Acute hypoxic resp failure. Suspected recurrent pna/HCAP/ asp pna.  Early ARDS Suspected undiagnosed OSA P:  Continue full vent since now paralyzed. FiO2 100% and PEEP to 16. Abx as below. Trach done. Diureses as below.  CARDIOVASCULAR A:  Septic Shock -- resolved. P:  D/C on pressors. Tachycardic 2 to fever and agitation.  RENAL Lab Results  Component Value Date   CREATININE 3.51* 03/23/2015   CREATININE 3.05* 03/22/2015   CREATININE 2.58* 03/21/2015  A:  Acute Kidney Injury Elevated but stable Na. P:  Trend BUN and creatinine. Creat stable. Free water 250 ml q6 hours. D/C D5W. Continue Lasix 5 mg/hr drip. Zaroxolyn 5 mg x1.  GASTROINTESTINAL A:  No active issues P:  Cont TF. NGT.  HEMATOLOGIC A:  Hx of sickle cell trait VTE  P:  D/C lovenox. Continue SQ heparin.  INFECTIOUS A:  Recurrent PNA  with suspected ARDS Leukocytosis Fever this am. Cultures (-) so far.  P:  D/Ced Vanc/cefepime 3/10. Follow CXR. Cultures negative but moderate yeast in sputum. ID consult appreciate.  ENDOCRINE A:  hyperglycemia  P:  ISS ordered moderate.  NEUROLOGIC A:  Mentally impaired with limited vocabulary of 500 words.  Autism  Decreased LOC from metabolic disarray. P:  RASS goal: -5 Propofol, fentanyl and nimbex.  There is no family bedside but given rate of deterioration, would recommend withdrawal and comfort measures at this point.  Issue here is patient becomes afraid and agitated>asynchrony>sedation>IVF/pressors>pulmonary edema>more agitation and repeat.  Will attempt precedex and avoid versed/fentanyl.  The patient is critically ill with multiple organ systems failure and requires high complexity decision making for assessment and support, frequent evaluation and titration of therapies, application of advanced monitoring technologies and extensive interpretation of multiple databases.   Critical Care Time devoted to patient care services described in this note is  35  Minutes. This time reflects time of care of this signee Dr Koren Bound. This critical care time does not reflect procedure time, or teaching time or supervisory time of PA/NP/Med student/Med Resident etc but could involve care discussion time.  Alyson Reedy, M.D. [facility] Pulmonary/Critical  Care Medicine. Pager: [phone]. After hours pager: [phone].  03/23/2015, 12:38 PM

## 2015-03-23 NOTE — Progress Notes (Signed)
eLink Physician-Brief Progress Note Patient Name: Juan RipperRobert Cameron DOB: 1975/10/08 MRN: [id number]   Date of Service  03/23/2015  HPI/Events of Note  Tachy HR 140 . BIS unchanged at 31. Paralyzed Looks calm Tem p 100F. Negative 5L [place]  eICU Interventions  ekg and reassess     Intervention Category Intermediate Interventions: Other:  [name] 03/23/2015, 11:28 PM

## 2015-03-23 NOTE — Progress Notes (Addendum)
INFECTIOUS DISEASE PROGRESS NOTE  ID: Juan Cameron is a 40 y.o. male with  Principal Problem:   Acute respiratory failure with hypoxia (HCC) Active Problems:   Autism   Acute lung injury   HCAP (healthcare-associated pneumonia)   Leukocytosis   Acute hyperglycemia   Left ventricular diastolic dysfunction, NYHA class 2   Thrombocytopenia (HCC)   ARDS (adult respiratory distress syndrome) (HCC)   Fever   Acute renal insufficiency   Obesity  Subjective: Hypertensive, agitated overnight.  Tmax 101 (1315) Fentanyl, propofol, numbex drips  Abtx:  Anti-infectives    Start     Dose/Rate Route Frequency Ordered Stop   03/20/15 1800  vancomycin (VANCOCIN) IVPB 750 mg/150 ml premix  Status:  Discontinued     750 mg 150 mL/hr over 60 Minutes Intravenous Every 24 hours 03/19/15 0225 03/21/15 1437   03/20/15 1400  ceFEPIme (MAXIPIME) 2 g in dextrose 5 % 50 mL IVPB  Status:  Discontinued     2 g 100 mL/hr over 30 Minutes Intravenous Every 12 hours 03/20/15 1311 03/21/15 1437   03/16/15 1400  vancomycin (VANCOCIN) IVPB 1000 mg/200 mL premix  Status:  Discontinued     1,000 mg 200 mL/hr over 60 Minutes Intravenous Every 12 hours 03/16/15 1251 03/19/15 0225   03/13/15 0000  vancomycin (VANCOCIN) IVPB 750 mg/150 ml premix     750 mg 150 mL/hr over 60 Minutes Intravenous Every 12 hours 03/12/15 1910 03/15/15 1349   03/11/15 1800  vancomycin (VANCOCIN) IVPB 1000 mg/200 mL premix  Status:  Discontinued     1,000 mg 200 mL/hr over 60 Minutes Intravenous Every 8 hours 03/11/15 0911 03/12/15 1910   03/11/15 0930  vancomycin (VANCOCIN) 2,000 mg in sodium chloride 0.9 % 500 mL IVPB     2,000 mg 250 mL/hr over 120 Minutes Intravenous  Once 03/11/15 0908 03/11/15 1138   03/11/15 0915  ceFEPIme (MAXIPIME) 1 g in dextrose 5 % 50 mL IVPB  Status:  Discontinued     1 g 100 mL/hr over 30 Minutes Intravenous 3 times per day 03/11/15 0843 03/11/15 0907   03/11/15 0915  ceFEPIme (MAXIPIME) 2 g in  dextrose 5 % 50 mL IVPB     2 g 100 mL/hr over 30 Minutes Intravenous 3 times per day 03/11/15 0907 03/18/15 2202      Medications:  Scheduled: . antiseptic oral rinse  7 mL Mouth Rinse QID  . artificial tears  1 application Both Eyes 3 times per day  . chlorhexidine gluconate  15 mL Mouth Rinse BID  . feeding supplement (PRO-STAT SUGAR FREE 64)  30 mL Per Tube BID  . feeding supplement (VITAL HIGH PROTEIN)  1,000 mL Per Tube Q24H  . free water  250 mL Per Tube Q6H  . heparin subcutaneous  5,000 Units Subcutaneous 3 times per day  . insulin aspart  2-6 Units Subcutaneous 6 times per day  . ipratropium-albuterol  3 mL Nebulization Q4H  . pantoprazole sodium  40 mg Per Tube Q1200  . QUEtiapine  200 mg Oral BID  . sodium chloride flush  3 mL Intravenous Q12H    Objective: Vital signs in last 24 hours: Temp:  [97 F (36.1 C)-100.6 F (38.1 C)] 97 F (36.1 C) (03/12 1300) Pulse Rate:  [102-132] 111 (03/12 1300) Resp:  [20-36] 23 (03/12 1300) BP: (77-245)/(43-93) 95/43 mmHg (03/12 1300) SpO2:  [9 %-100 %] 100 % (03/12 1300) Arterial Line BP: (71-181)/(45-138) 106/101 mmHg (03/12 1200) FiO2 (%):  [100 %]  100 % (03/12 1218)   General appearance: no distress and sedated on vent Neck: L neck IJ Resp: coarse breath sounds B Cardio: tachycardia GI: abnormal findings:  distended and hypoactive bowel sounds  Lab Results  Recent Labs  03/22/15 0400 03/23/15 0418  WBC 16.0* 19.5*  HGB 9.5* 9.1*  HCT 30.3* 28.4*  NA 145 144  K 3.7 3.5  CL 107 99*  CO2 26 28  BUN 62* 74*  CREATININE 3.05* 3.51*   Liver Panel No results for input(s): PROT, ALBUMIN, AST, ALT, ALKPHOS, BILITOT, BILIDIR, IBILI in the last 72 hours. Sedimentation Rate No results for input(s): ESRSEDRATE in the last 72 hours. C-Reactive Protein No results for input(s): CRP in the last 72 hours.  Microbiology: Recent Results (from the past 240 hour(s))  Culture, blood (routine x 2)     Status: None    Collection Time: 03/15/15  2:45 PM  Result Value Ref Range Status   Specimen Description BLOOD RIGHT ANTECUBITAL  Final   Special Requests BOTTLES DRAWN AEROBIC AND ANAEROBIC 10CC  Final   Culture NO GROWTH 5 DAYS  Final   Report Status 03/20/2015 FINAL  Final  Culture, blood (routine x 2)     Status: None   Collection Time: 03/15/15  3:00 PM  Result Value Ref Range Status   Specimen Description BLOOD BLOOD RIGHT HAND  Final   Special Requests BOTTLES DRAWN AEROBIC AND ANAEROBIC 10CC  Final   Culture NO GROWTH 5 DAYS  Final   Report Status 03/20/2015 FINAL  Final  Culture, respiratory (NON-Expectorated)     Status: None   Collection Time: 03/15/15  3:10 PM  Result Value Ref Range Status   Specimen Description SPUTUM  Final   Special Requests NONE  Final   Gram Stain   Final    FEW WBC PRESENT,BOTH PMN AND MONONUCLEAR NO SQUAMOUS EPITHELIAL CELLS SEEN RARE YEAST THIS SPECIMEN IS ACCEPTABLE FOR SPUTUM CULTURE Performed at [facility]    Culture   Final    MODERATE YEAST Performed at [facility]    Report Status 03/18/2015 FINAL  Final  Gram stain     Status: None   Collection Time: 03/16/15 11:28 AM  Result Value Ref Range Status   Specimen Description TRACHEAL SITE  Final   Special Requests NONE  Final   Gram Stain   Final    FEW WBC PRESENT,BOTH PMN AND MONONUCLEAR FEW YEAST    Report Status 03/16/2015 FINAL  Final  Culture, respiratory (NON-Expectorated)     Status: None   Collection Time: 03/16/15 11:28 AM  Result Value Ref Range Status   Specimen Description TRACHEAL ASPIRATE  Final   Special Requests Normal  Final   Gram Stain   Final    FEW WBC PRESENT,BOTH PMN AND MONONUCLEAR FEW YEAST Performed at [name] Performed at [facility]    Culture   Final    MODERATE YEAST Performed at [facility]    Report Status 03/18/2015 FINAL  Final  Culture, respiratory (NON-Expectorated)     Status: None (Preliminary  result)   Collection Time: 03/20/15  3:08 PM  Result Value Ref Range Status   Specimen Description BRONCHIAL ALVEOLAR LAVAGE  Final   Special Requests NONE  Final   Gram Stain   Final    MODERATE WBC PRESENT, PREDOMINANTLY PMN NO SQUAMOUS EPITHELIAL CELLS SEEN NO ORGANISMS SEEN Performed at [facility]   Final  FEW YEAST CONSISTENT WITH CANDIDA SPECIES Performed at [facility]    Report Status PENDING  Incomplete  Culture, Urine     Status: None   Collection Time: 03/21/15  3:19 PM  Result Value Ref Range Status   Specimen Description URINE, RANDOM  Final   Special Requests NONE  Final   Culture NO GROWTH 1 DAY  Final   Report Status 03/22/2015 FINAL  Final  Culture, blood (routine x 2)     Status: None (Preliminary result)   Collection Time: 03/21/15  6:19 PM  Result Value Ref Range Status   Specimen Description BLOOD RIGHT ANTECUBITAL  Final   Special Requests BOTTLES DRAWN AEROBIC AND ANAEROBIC 10CC  Final   Culture NO GROWTH 2 DAYS  Final   Report Status PENDING  Incomplete  Culture, blood (routine x 2)     Status: None (Preliminary result)   Collection Time: 03/21/15  6:33 PM  Result Value Ref Range Status   Specimen Description BLOOD RIGHT HAND  Final   Special Requests BOTTLES DRAWN AEROBIC ONLY 10CC  Final   Culture NO GROWTH 2 DAYS  Final   Report Status PENDING  Incomplete    Studies/Results: Dg Chest Port 1 View  03/23/2015  CLINICAL DATA:  Respiratory failure EXAM: PORTABLE CHEST 1 VIEW COMPARISON:  03/22/2015 FINDINGS: Pulmonary vascular congestion with mild interstitial edema. Mild patchy bilateral lower lobe opacities, likely atelectasis, pneumonia not excluded. Possible small left pleural effusion. No pneumothorax. Tracheostomy in satisfactory position. The heart is normal in size. Left IJ venous catheter in the left brachiocephalic vein. Enteric tube terminates in the stomach. IMPRESSION: Pulmonary vascular congestion with mild  interstitial edema and possible small left pleural effusion, grossly unchanged. Mild patchy bilateral lower lobe opacities, likely atelectasis, pneumonia not excluded. Electronically Signed   By: Charline Bills M.D.   On: 03/23/2015 07:32   Dg Chest Port 1 View  03/22/2015  CLINICAL DATA:  Hypoxia.  Acute respiratory failure. EXAM: PORTABLE CHEST 1 VIEW COMPARISON:  03/21/2015 FINDINGS: 1610 hours. Tracheostomy tube again noted. The NG tube passes into the stomach although the distal tip position is not included on the film. Lordotic rotated film. Cardiopericardial silhouette is at upper limits of normal for size. Vascular congestion and basilar airspace disease persists. Telemetry leads overlie the chest. IMPRESSION: Low volume film with features suggesting pulmonary edema. No substantial interval change. Electronically Signed   By: Kennith Center M.D.   On: 03/22/2015 16:56   Dg Chest Port 1 View  03/22/2015  CLINICAL DATA:  ETT evaluation EXAM: PORTABLE CHEST 1 VIEW COMPARISON:  March 21, 2015 FINDINGS: The tracheostomy tube is in good position. An NG tube terminates below today's film. The left IJ is stable. No pneumothorax. Mild pulmonary opacities are stable. IMPRESSION: Appropriate placement of support apparatus. Stable pulmonary opacities favored to represent edema. Electronically Signed   By: Gerome Sam III M.D   On: 03/22/2015 08:11     Assessment/Plan: FUO Pneumonia (BAL 3-1 (-)) Respiratory Virus Panel 2-28 (-) Fluid Overload VDRF, ARDS ARF Autism  Total days of antibiotics: vanco/cefepime (11 days, on hold last 48h)  Cr worse, WBC higher CXR suggestive of pulm edema.  Will continue to watch off anbx Suspect his current low BP are from his sedation.  Repeat BCx are ngtd (3-10) and UCx (-) Consider abd film for ileus, amylase lipase (-).  No BM since the 9th Watch his central line.          Tinnie Gens  Jahmere Bramel Infectious Diseases (pager)  [phone] [email or web address] 03/23/2015, 2:43 PM  LOS: 12 days

## 2015-03-24 ENCOUNTER — Inpatient Hospital Stay (HOSPITAL_COMMUNITY): Payer: Medicare Other

## 2015-03-24 DIAGNOSIS — R7309 Other abnormal glucose: Secondary | ICD-10-CM

## 2015-03-24 DIAGNOSIS — I82403 Acute embolism and thrombosis of unspecified deep veins of lower extremity, bilateral: Secondary | ICD-10-CM

## 2015-03-24 DIAGNOSIS — J9600 Acute respiratory failure, unspecified whether with hypoxia or hypercapnia: Secondary | ICD-10-CM | POA: Insufficient documentation

## 2015-03-24 DIAGNOSIS — S27309A Unspecified injury of lung, unspecified, initial encounter: Secondary | ICD-10-CM

## 2015-03-24 LAB — BASIC METABOLIC PANEL
ANION GAP: 17 — AB (ref 5–15)
BUN: 90 mg/dL — AB (ref 6–20)
CHLORIDE: 98 mmol/L — AB (ref 101–111)
CO2: 29 mmol/L (ref 22–32)
Calcium: 8.9 mg/dL (ref 8.9–10.3)
Creatinine, Ser: 4.49 mg/dL — ABNORMAL HIGH (ref 0.61–1.24)
GFR calc Af Amer: 18 mL/min — ABNORMAL LOW (ref >60)
GFR, EST NON AFRICAN AMERICAN: 15 mL/min — AB (ref >60)
Glucose, Bld: 120 mg/dL — ABNORMAL HIGH (ref 65–99)
POTASSIUM: 4 mmol/L (ref 3.5–5.1)
SODIUM: 144 mmol/L (ref 135–145)

## 2015-03-24 LAB — GLUCOSE, CAPILLARY
GLUCOSE-CAPILLARY: 102 mg/dL — AB (ref 65–99)
GLUCOSE-CAPILLARY: 124 mg/dL — AB (ref 65–99)
GLUCOSE-CAPILLARY: 123 mg/dL — AB (ref 65–99)
Glucose-Capillary: 113 mg/dL — ABNORMAL HIGH (ref 65–99)
Glucose-Capillary: 118 mg/dL — ABNORMAL HIGH (ref 65–99)
Glucose-Capillary: 143 mg/dL — ABNORMAL HIGH (ref 65–99)

## 2015-03-24 LAB — CBC
HCT: 31.2 % — ABNORMAL LOW (ref 39.0–52.0)
HEMOGLOBIN: 9.8 g/dL — AB (ref 13.0–17.0)
MCH: 25.6 pg — AB (ref 26.0–34.0)
MCHC: 31.4 g/dL (ref 30.0–36.0)
MCV: 81.5 fL (ref 78.0–100.0)
PLATELETS: 105 10*3/uL — AB (ref 150–400)
RBC: 3.83 MIL/uL — AB (ref 4.22–5.81)
RDW: 15.5 % (ref 11.5–15.5)
WBC: 10.3 10*3/uL (ref 4.0–10.5)

## 2015-03-24 LAB — POCT I-STAT 3, ART BLOOD GAS (G3+)
Acid-Base Excess: 5 mmol/L — ABNORMAL HIGH (ref 0.0–2.0)
BICARBONATE: 34.5 meq/L — AB (ref 20.0–24.0)
O2 Saturation: 96 %
PCO2 ART: 73.5 mmHg — AB (ref 35.0–45.0)
TCO2: 37 mmol/L (ref 0–100)
pH, Arterial: 7.281 — ABNORMAL LOW (ref 7.350–7.450)
pO2, Arterial: 94 mmHg (ref 80.0–100.0)

## 2015-03-24 LAB — MAGNESIUM: Magnesium: 2.5 mg/dL — ABNORMAL HIGH (ref 1.7–2.4)

## 2015-03-24 LAB — PHOSPHORUS: Phosphorus: 7.1 mg/dL — ABNORMAL HIGH (ref 2.5–4.6)

## 2015-03-24 MED ORDER — POLYETHYLENE GLYCOL 3350 17 G PO PACK
17.0000 g | PACK | Freq: Every day | ORAL | Status: DC
  Administered 2015-03-24 – 2015-03-26 (×3): 17 g via ORAL
  Filled 2015-03-24 (×15): qty 1

## 2015-03-24 MED ORDER — SODIUM CHLORIDE 0.9 % IV SOLN
INTRAVENOUS | Status: DC
  Administered 2015-03-24 – 2015-03-27 (×3): via INTRAVENOUS

## 2015-03-24 MED ORDER — DOCUSATE SODIUM 100 MG PO CAPS
100.0000 mg | ORAL_CAPSULE | Freq: Every day | ORAL | Status: DC
  Administered 2015-03-24: 100 mg via ORAL
  Filled 2015-03-24 (×3): qty 1

## 2015-03-24 NOTE — Progress Notes (Signed)
Patient ID: Juan RipperRobert Cameron, male   DOB: 06-29-1975, 40 y.o.   MRN: [id number]         [facility]    Date of Admission:  03/11/2015     Principal Problem:   Acute respiratory failure with hypoxia (HCC) Active Problems:   HCAP (healthcare-associated pneumonia)   ARDS (adult respiratory distress syndrome) (HCC)   Fever   Acute renal insufficiency   Autism   Acute lung injury   Leukocytosis   Acute hyperglycemia   Left ventricular diastolic dysfunction, NYHA class 2   Thrombocytopenia (HCC)   Obesity   . antiseptic oral rinse  7 mL Mouth Rinse QID  . artificial tears  1 application Both Eyes 3 times per day  . chlorhexidine gluconate  15 mL Mouth Rinse BID  . feeding supplement (PRO-STAT SUGAR FREE 64)  30 mL Per Tube BID  . feeding supplement (VITAL HIGH PROTEIN)  1,000 mL Per Tube Q24H  . free water  250 mL Per Tube Q6H  . heparin subcutaneous  5,000 Units Subcutaneous 3 times per day  . insulin aspart  2-6 Units Subcutaneous 6 times per day  . ipratropium-albuterol  3 mL Nebulization Q4H  . pantoprazole sodium  40 mg Per Tube Q1200  . QUEtiapine  200 mg Oral BID  . sodium chloride flush  3 mL Intravenous Q12H   Review of Systems: Review of Systems  Unable to perform ROS: intubated    Past Medical History  Diagnosis Date  . Autism   . Sickle cell trait (HCC)   . Pneumonia 02/2015  . Autism     Social History  Substance Use Topics  . Smoking status: Never Smoker   . Smokeless tobacco: Never Used  . Alcohol Use: No    Family History  Problem Relation Age of Onset  . Heart attack Mother    Allergies  Allergen Reactions  . Peanuts [Peanut Oil] Anaphylaxis    OBJECTIVE: Filed Vitals:   03/24/15 0800 03/24/15 0900 03/24/15 1000 03/24/15 1100  BP: 122/71 102/57 126/73 99/55  Pulse: 129 121 127 115  Temp:      TempSrc:      Resp: 23 23 24 23   Height:      Weight:      SpO2: 96% 95% 96% 96%   Body mass index is 35.28  kg/(m^2).  Physical Exam  Constitutional:  He remains sedated on the ventilator.  Cardiovascular: Regular rhythm.   No murmur heard. Tachycardic.  Pulmonary/Chest:  Clear anteriorly. He is not requiring much suctioning of his trach.  Abdominal: Soft.  No diarrhea.  Skin: No rash noted.  Left neck and left arm IV sites okay.    Lab Results Lab Results  Component Value Date   WBC 10.3 03/24/2015   HGB 9.8* 03/24/2015   HCT 31.2* 03/24/2015   MCV 81.5 03/24/2015   PLT 105* 03/24/2015    Lab Results  Component Value Date   CREATININE 4.49* 03/24/2015   BUN 90* 03/24/2015   NA 144 03/24/2015   K 4.0 03/24/2015   CL 98* 03/24/2015   CO2 29 03/24/2015    Lab Results  Component Value Date   ALT 25 03/12/2015   AST 51* 03/12/2015   ALKPHOS 44 03/12/2015   BILITOT 0.6 03/12/2015     Microbiology: Recent Results (from the past 240 hour(s))  Culture, blood (routine x 2)     Status: None   Collection Time: 03/15/15  2:45 PM  Result Value Ref Range Status   Specimen Description BLOOD RIGHT ANTECUBITAL  Final   Special Requests BOTTLES DRAWN AEROBIC AND ANAEROBIC 10CC  Final   Culture NO GROWTH 5 DAYS  Final   Report Status 03/20/2015 FINAL  Final  Culture, blood (routine x 2)     Status: None   Collection Time: 03/15/15  3:00 PM  Result Value Ref Range Status   Specimen Description BLOOD BLOOD RIGHT HAND  Final   Special Requests BOTTLES DRAWN AEROBIC AND ANAEROBIC 10CC  Final   Culture NO GROWTH 5 DAYS  Final   Report Status 03/20/2015 FINAL  Final  Culture, respiratory (NON-Expectorated)     Status: None   Collection Time: 03/15/15  3:10 PM  Result Value Ref Range Status   Specimen Description SPUTUM  Final   Special Requests NONE  Final   Gram Stain   Final    FEW WBC PRESENT,BOTH PMN AND MONONUCLEAR NO SQUAMOUS EPITHELIAL CELLS SEEN RARE YEAST THIS SPECIMEN IS ACCEPTABLE FOR SPUTUM CULTURE Performed at [facility]    Culture   Final    MODERATE  YEAST Performed at [facility]    Report Status 03/18/2015 FINAL  Final  Gram stain     Status: None   Collection Time: 03/16/15 11:28 AM  Result Value Ref Range Status   Specimen Description TRACHEAL SITE  Final   Special Requests NONE  Final   Gram Stain   Final    FEW WBC PRESENT,BOTH PMN AND MONONUCLEAR FEW YEAST    Report Status 03/16/2015 FINAL  Final  Culture, respiratory (NON-Expectorated)     Status: None   Collection Time: 03/16/15 11:28 AM  Result Value Ref Range Status   Specimen Description TRACHEAL ASPIRATE  Final   Special Requests Normal  Final   Gram Stain   Final    FEW WBC PRESENT,BOTH PMN AND MONONUCLEAR FEW YEAST Performed at [facility] Performed at [facility]    Culture   Final    MODERATE YEAST Performed at [facility]    Report Status 03/18/2015 FINAL  Final  Culture, respiratory (NON-Expectorated)     Status: None   Collection Time: 03/20/15  3:08 PM  Result Value Ref Range Status   Specimen Description BRONCHIAL ALVEOLAR LAVAGE  Final   Special Requests NONE  Final   Gram Stain   Final    MODERATE WBC PRESENT, PREDOMINANTLY PMN NO SQUAMOUS EPITHELIAL CELLS SEEN NO ORGANISMS SEEN Performed at [facility]    Culture   Final    FEW YEAST CONSISTENT WITH CANDIDA SPECIES Performed at [facility]    Report Status 03/23/2015 FINAL  Final  Culture, Urine     Status: None   Collection Time: 03/21/15  3:19 PM  Result Value Ref Range Status   Specimen Description URINE, RANDOM  Final   Special Requests NONE  Final   Culture NO GROWTH 1 DAY  Final   Report Status 03/22/2015 FINAL  Final  Culture, blood (routine x 2)     Status: None (Preliminary result)   Collection Time: 03/21/15  6:19 PM  Result Value Ref Range Status   Specimen Description BLOOD RIGHT ANTECUBITAL  Final   Special Requests BOTTLES DRAWN AEROBIC AND ANAEROBIC 10CC  Final   Culture NO GROWTH 3 DAYS  Final   Report Status  PENDING  Incomplete  Culture, blood (routine x 2)     Status: None (Preliminary result)   Collection  Time: 03/21/15  6:33 PM  Result Value Ref Range Status   Specimen Description BLOOD RIGHT HAND  Final   Special Requests BOTTLES DRAWN AEROBIC ONLY 10CC  Final   Culture NO GROWTH 3 DAYS  Final   Report Status PENDING  Incomplete     ASSESSMENT: The cause of his respiratory failure remains unclear. It is also unclear why he has been having intermittent fevers recently. Repeat urine and blood cultures remain negative. Sputum has only grown yeast which is an insignificant colonizer. I favor continued observation off of antibiotics for now. I believe that his chest x-ray actually shows some improvement in his vascular congestion compared with last week.  PLAN: 1. Observe off of antibiotics  Cliffton Asters, MD [facility] [phone] pager   [phone] cell 03/24/2015, 12:15 PM

## 2015-03-24 NOTE — Progress Notes (Signed)
eLink Physician-Brief Progress Note Patient Name: Juan RipperRobert Lajara DOB: 09/05/1975 MRN: [id number]   Date of Service  03/24/2015  HPI/Events of Note  A-line not working  eICU Interventions  Dc aline     Intervention Category Evaluation Type: Other  [name] 03/24/2015, 3:29 AM

## 2015-03-24 NOTE — Progress Notes (Signed)
UR Completed. Juancarlos Crescenzo, RN, BSN.  [phone] 

## 2015-03-24 NOTE — Clinical Social Work Note (Signed)
Clinical Social Work Assessment  Patient Details  Name: Juan Cameron MRN: [id number] Date of Birth: [birth year]  Date of referral:  03/24/15               Reason for consult:  Discharge Planning, Facility Placement                Permission sought to share information with:  Case Manager, Family Supports, Magazine features editoracility Contact Representative Permission granted to share information::  Yes, Verbal Permission Granted  Name::     Juan Cameron (sister) [phone]  Agency::     Relationship::     Contact Information:     Housing/Transportation Living arrangements for the past 2 months:  Single Family Home Source of Information:  Other (Comment Required) (Sibling- Juan Pihourtney Locey) Patient Interpreter Needed:  None Criminal Activity/Legal Involvement Pertinent to Current Situation/Hospitalization:  No - Comment as needed Significant Relationships:  Siblings, Other Family Members Lives with:  Siblings Do you feel safe going back to the place where you live?  No Need for family participation in patient care:  Yes (Comment)  Care giving concerns:  Patient's sister reports that she is Patient's primary caregiver. She reports that she will be unable to provide the care that he will need if he remains on the vent/trach.    Social Worker assessment / plan:  Patient is a 40 YO Autistic African American male who was admitted due to respiratory failure. CSW engaged with Patient's sister and primary caregiver at Patient's bedside to introduce self, role of CSW, and begin discussion of discharge planning. CSW provided family with list of Vent SNF's including [place] in [place] and [place]. CSW explained that Vent SNF's in [place] typically have a waitlist and it is likely that Patient will have to be placed out of state.  Patient's sister reports that she would like for her [facility] prior to SNF Placement. CSW has completed FL2 and obtained Level 2  Passar for SNF Placment if family choses to go with SNF placement. CSW will continue to follow for disposition.   Employment status:  Disabled (Comment on whether or not currently receiving Disability) Insurance information:  Medicare PT Recommendations:  Not assessed at this time Information / Referral to community resources:   Psychologist, clinical([facility] Skilled Nursing Facilities)  Patient/Family's Response to care: Patient's sister appreciative of care at this time.   Patient/Family's Understanding of and Emotional Response to Diagnosis, Current Treatment, and Prognosis:  Patient's sister verbalizes a strong understanding of Patient's diagnosis, current treatment, and prognosis. Patient's sister reports that she is open to SNF placement but would prefer [facility] as first option.   Emotional Assessment Appearance:  Appears stated age Attitude/Demeanor/Rapport:  Intubated (Following Commands or Not Following Commands) Affect (typically observed):  Unable to Assess Orientation:   (Unable to Assess) Alcohol / Substance use:  Not Applicable Psych involvement (Current and /or in the community):  No (Comment)  Discharge Needs  Concerns to be addressed:  Discharge Planning Concerns Readmission within the last 30 days:  Yes Current discharge risk:  None Barriers to Discharge:  Continued Medical Work up   [place] Airl[place]ley N Gardner, LCSW 03/24/2015, 10:14 AM

## 2015-03-24 NOTE — Progress Notes (Signed)
eLink Physician-Brief Progress Note Patient Name: Juan RipperRobert Witherell DOB: 1975-05-07 MRN: [id number]   Date of Service  03/24/2015  HPI/Events of Note  Sister who is the medical POA now wants Full Code Status.   eICU Interventions  Will change to Full Code Status.     Intervention Category Minor Interventions: Communication with other healthcare providers and/or family  Lenell AntuSommer,Steven Eugene 03/24/2015, 5:41 PM

## 2015-03-24 NOTE — Progress Notes (Signed)
   03/24/15 1622  Vitals  Temp (!) 96.7 F (35.9 C) (Notified RN; MD/Sommers notified)  Temp Source Rectal

## 2015-03-24 NOTE — Progress Notes (Addendum)
PULMONARY / CRITICAL CARE MEDICINE   Name: Juan Cameron MRN: [id number] DOB: Jun 02, 1975    ADMISSION DATE:  03/11/2015  CONSULTATION DATE: 2/28  REFERRING MD: [facility]  CHIEF COMPLAINT: resp failure  HISTORY OF PRESENT ILLNESS:  40 autistic male recently in hospital for multilobar pneumonia and dc'd on 6 l [place] and returned in 24 hours with sats in 50%. He was discharged on 03/10/15 after treatment for CAP, NOI, and was on 6 l [place]. Sister [facility]) checked sats and he was 50% and therefore was transported back to [facility] and required 80% fio2 on NIVMS with RR 45. CXR consistent early ARDS, 2d echo with nl EF but grade 2 dysfunction. PCCM called to bedside on 3/1 with anticipation of requiring to be intubated.  SUBJECTIVE:  Remains paralyzed, peep 16  VITAL SIGNS: BP 111/57 mmHg  Pulse 125  Temp(Src) 98.9 F (37.2 C) (Oral)  Resp 23  Ht  (1.702 m)  Wt 102.2 kg (225 lb 5 oz)  BMI 35.28 kg/m2  SpO2 95%  HEMODYNAMICS:    VENTILATOR SETTINGS: Vent Mode:  [-] PRVC FiO2 (%):  [80 %-90 %] 80 % Set Rate:  [23 bmp] 23 bmp Vt Set:  [400 mL] 400 mL PEEP:  [16 cmH20] 16 cmH20 Plateau Pressure:  [28 cmH20-40 cmH20] 34 cmH20  INTAKE / OUTPUT: I/O last 3 completed shifts: In: 3210.9 [I.V.:2260.9; NG/GT:850; IV Piggyback:100] Out: 4875 [Urine:4725; Emesis/NG output:150]  PHYSICAL EXAMINATION: General: Sedated and paralyzed. Neuro: Sedated and paralyzed. HEENT: [place]/AT and MMM. Cardiovascular: RRR s1 s2  Lungs: ronchi Abdomen: Obese , protrubent, hypoactive bowel sounds. Musculoskeletal: intact Skin: Warm, moist  LABS:  BMET  Recent Labs Lab 03/22/15 0400 03/23/15 0418 03/24/15 0345  NA 145 144 144  K 3.7 3.5 4.0  CL 107 99* 98*  CO2 BUN 62* 74* 90*  CREATININE 3.05* 3.51* 4.49*  GLUCOSE 185* 149* 120*   Electrolytes  Recent Labs Lab 03/22/15 0400 03/23/15 0418 03/24/15 0345  CALCIUM 8.6* 8.7* 8.9  MG 2.4 2.4 2.5*  PHOS 3.3 8.3* 7.1*    CBC  Recent Labs Lab 03/22/15 0400 03/23/15 0418 03/24/15 0345  WBC 16.0* 19.5* 10.3  HGB 9.5* 9.1* 9.8*  HCT 30.3* 28.4* 31.2*  PLT 104* 119* 105*   Coag's  Recent Labs Lab 03/20/15 0354  APTT 33  INR 1.37   Sepsis Markers No results for input(s): LATICACIDVEN, PROCALCITON, O2SATVEN in the last 168 hours. ABG  Recent Labs Lab 03/21/15 0331 03/22/15 0420 03/22/15 1726  PHART 7.292* 7.416 7.356  PCO2ART 54.6* 40.4 53.9*  PO2ART 80.3 71.5* 85.0   Liver Enzymes No results for input(s): AST, ALT, ALKPHOS, BILITOT, ALBUMIN in the last 168 hours.  Cardiac Enzymes  Recent Labs Lab 03/22/15 1725 03/22/15 2304 03/23/15 0418  TROPONINI 0.08* 0.11* 0.07*   Glucose  Recent Labs Lab 03/23/15 1605 03/23/15 1938 03/24/15 0034 03/24/15 0332 03/24/15 0809 03/24/15 1132  GLUCAP 108* 109* 118* 102* 113* 124*   Imaging Dg Chest Port 1 View  03/24/2015  CLINICAL DATA:  40 year old male with sickle cell trait. Respiratory failure. Subsequent encounter. EXAM: PORTABLE CHEST 1 VIEW COMPARISON:  03/23/2015. FINDINGS: Tracheostomy tube tip midline. Left central line tip projects at the level of the left upper lung unchanged from prior exam. Exact position indeterminate by plain film exam. This may be within the left internal jugular vein/ subclavian vein junction. Correlation with blood return recommended. Cardiomegaly. Pulmonary vascular congestion/ mild pulmonary edema similar to prior exam. Patchy  consolidation lung bases greater on left unchanged. This may represent atelectasis although infiltrate not excluded. Recommend followup until clearance. Mild sclerotic appearance of bone marrow may be related to patient's history of sickle cell disease. No gross pneumothorax. IMPRESSION: Left central line tip projects at the level of the left upper lung unchanged from prior exam. Exact position indeterminate by plain film exam. This may be within the left internal jugular vein/  subclavian vein junction. Correlation with blood return recommended. Cardiomegaly. Pulmonary vascular congestion/ mild pulmonary edema similar to prior exam. Patchy consolidation lung bases greater on left unchanged. This may represent atelectasis although infiltrate not excluded. Electronically Signed   By: Lacy DuverneySteven  Olson M.D.   On: 03/24/2015 07:44   CULTURES: 2/28 bc x 2 negative 2/28 uc negative 2/28 RVP negative Sputum culture 3/4, 3/5, 3/9 >>yeast Blood culture 3/4 >>  ANTIBIOTICS: 2/28 vanc>>3/10 2/28 cefepime>>3/10  SIGNIFICANT EVENTS: 2/28 readmit to hospital  03/12/15 Intubated 3/1 Bronchoscopy   LINES/TUBES: Central line L IJ TLC>3/1 ETT out Trach 3/9>>>   ASSESSMENT / PLAN:  PULMONARY A: Acute hypoxic resp failure. Suspected recurrent pna/HCAP/ asp pna.  Early ARDS Suspected undiagnosed OSA P:  Peep to remain 16, goal to 70% if able, if able then to peep 14 Diureses as renal tolerates, was neg 1.5 liters pcxr in am  Keep plat less 30  Rate at 23, repeat abg today Clinical declines, add steroids empiric  CARDIOVASCULAR A:  Septic Shock -- resolved. P:  tele  RENAL Lab Results  Component Value Date   CREATININE 4.49* 03/24/2015   CREATININE 3.51* 03/23/2015   CREATININE 3.05* 03/22/2015  A:  Acute Kidney Injury Elevated but stable Na. P:  Dc lasix, zarox Re assess cvp  bmet in am  Re add saline at 50   GASTROINTESTINAL A:  Last BM 11th P:  Cont TF NGT. Last BM assessment, needed Add mir, colace  HEMATOLOGIC A:  Hx of sickle cell trait VTE  P:  Cbc in am  Continue SQ heparin.  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 Cultures (-) so far.  P:  Follow CXR. Cultures negative but moderate yeast in sputum with lack of progress, ID consider treatment? Is higher risk aspiration   ENDOCRINE A:  hyperglycemia  P:  ISS ordered moderate.  NEUROLOGIC A:   Mentally impaired with limited vocabulary of 500 words.  Autism  Decreased LOC from metabolic disarray. P:  RASS goal: -5 Propofol, fentanyl and nimbex.  Sister updated by me Ccm time 35 min   Mcarthur RossettiDaniel J. Tyson AliasFeinstein, MD, FACP Pgr: [phone] [facility]    I have had extensive discussions with family med poa sister. We discussed patients current circumstances and organ failures. We also discussed patient's prior wishes under circumstances such as this. Family has decided to NOT perform resuscitation if arrest but to continue current medical support for now.

## 2015-03-24 NOTE — Progress Notes (Signed)
VASCULAR LAB PRELIMINARY  PRELIMINARY  PRELIMINARY  PRELIMINARY  Bilateral lower extremity venous duplex completed.    Bilateral:  No evidence of DVT, superficial thrombosis, or Baker's Cyst.  Juan Cameron, RVT, RDMS 03/24/2015, 8:16 PM

## 2015-03-25 ENCOUNTER — Inpatient Hospital Stay (HOSPITAL_COMMUNITY): Payer: Medicare Other

## 2015-03-25 DIAGNOSIS — R0602 Shortness of breath: Secondary | ICD-10-CM

## 2015-03-25 LAB — CBC WITH DIFFERENTIAL/PLATELET
Basophils Absolute: 0 10*3/uL (ref 0.0–0.1)
Basophils Relative: 0 %
Eosinophils Absolute: 1 10*3/uL — ABNORMAL HIGH (ref 0.0–0.7)
Eosinophils Relative: 7 %
HCT: 27.6 % — ABNORMAL LOW (ref 39.0–52.0)
HEMOGLOBIN: 8.9 g/dL — AB (ref 13.0–17.0)
LYMPHS ABS: 1.5 10*3/uL (ref 0.7–4.0)
LYMPHS PCT: 12 %
MCH: 26.1 pg (ref 26.0–34.0)
MCHC: 32.2 g/dL (ref 30.0–36.0)
MCV: 80.9 fL (ref 78.0–100.0)
MONOS PCT: 6 %
Monocytes Absolute: 0.8 10*3/uL (ref 0.1–1.0)
NEUTROS ABS: 9.9 10*3/uL — AB (ref 1.7–7.7)
NEUTROS PCT: 75 %
Platelets: 158 10*3/uL (ref 150–400)
RBC: 3.41 MIL/uL — ABNORMAL LOW (ref 4.22–5.81)
RDW: 15.1 % (ref 11.5–15.5)
WBC: 13.1 10*3/uL — ABNORMAL HIGH (ref 4.0–10.5)

## 2015-03-25 LAB — COMPREHENSIVE METABOLIC PANEL
ALT: 35 U/L (ref 17–63)
ANION GAP: 19 — AB (ref 5–15)
AST: 36 U/L (ref 15–41)
Albumin: 2.4 g/dL — ABNORMAL LOW (ref 3.5–5.0)
Alkaline Phosphatase: 86 U/L (ref 38–126)
BUN: 120 mg/dL — ABNORMAL HIGH (ref 6–20)
CHLORIDE: 97 mmol/L — AB (ref 101–111)
CO2: 29 mmol/L (ref 22–32)
CREATININE: 5.46 mg/dL — AB (ref 0.61–1.24)
Calcium: 9 mg/dL (ref 8.9–10.3)
GFR calc non Af Amer: 12 mL/min — ABNORMAL LOW (ref >60)
GFR, EST AFRICAN AMERICAN: 14 mL/min — AB (ref >60)
Glucose, Bld: 131 mg/dL — ABNORMAL HIGH (ref 65–99)
POTASSIUM: 3.6 mmol/L (ref 3.5–5.1)
SODIUM: 145 mmol/L (ref 135–145)
Total Bilirubin: 1.2 mg/dL (ref 0.3–1.2)
Total Protein: 6.5 g/dL (ref 6.5–8.1)

## 2015-03-25 LAB — GLUCOSE, CAPILLARY
GLUCOSE-CAPILLARY: 168 mg/dL — AB (ref 65–99)
Glucose-Capillary: 105 mg/dL — ABNORMAL HIGH (ref 65–99)
Glucose-Capillary: 115 mg/dL — ABNORMAL HIGH (ref 65–99)
Glucose-Capillary: 148 mg/dL — ABNORMAL HIGH (ref 65–99)
Glucose-Capillary: 153 mg/dL — ABNORMAL HIGH (ref 65–99)

## 2015-03-25 LAB — POCT I-STAT 3, ART BLOOD GAS (G3+)
Acid-Base Excess: 5 mmol/L — ABNORMAL HIGH (ref 0.0–2.0)
Acid-Base Excess: 3 mmol/L — ABNORMAL HIGH (ref 0.0–2.0)
BICARBONATE: 32.9 meq/L — AB (ref 20.0–24.0)
BICARBONATE: 32.2 meq/L — AB (ref 20.0–24.0)
O2 Saturation: 90 %
O2 Saturation: 86 %
PCO2 ART: 66.9 mmHg — AB (ref 35.0–45.0)
PO2 ART: 67 mmHg — AB (ref 80.0–100.0)
PO2 ART: 66 mmHg — AB (ref 80.0–100.0)
Patient temperature: 98.3
TCO2: 35 mmol/L (ref 0–100)
TCO2: 35 mmol/L (ref 0–100)
pCO2 arterial: 80 mmHg (ref 35.0–45.0)
pH, Arterial: 7.299 — ABNORMAL LOW (ref 7.350–7.450)
pH, Arterial: 7.214 — ABNORMAL LOW (ref 7.350–7.450)

## 2015-03-25 LAB — TRIGLYCERIDES: TRIGLYCERIDES: 557 mg/dL — AB (ref <150)

## 2015-03-25 MED ORDER — BISACODYL 10 MG RE SUPP
10.0000 mg | Freq: Once | RECTAL | Status: AC
  Administered 2015-03-25: 10 mg via RECTAL
  Filled 2015-03-25: qty 1

## 2015-03-25 MED ORDER — SODIUM CHLORIDE 0.9 % IV SOLN
25.0000 ug/h | INTRAVENOUS | Status: DC
  Administered 2015-03-25: 175 ug/h via INTRAVENOUS
  Administered 2015-03-26: 300 ug/h via INTRAVENOUS
  Administered 2015-03-26: 200 ug/h via INTRAVENOUS
  Administered 2015-03-27 – 2015-03-28 (×5): 300 ug/h via INTRAVENOUS
  Administered 2015-03-29: 200 ug/h via INTRAVENOUS
  Administered 2015-03-29 – 2015-03-30 (×2): 300 ug/h via INTRAVENOUS
  Administered 2015-03-30 – 2015-04-01 (×5): 250 ug/h via INTRAVENOUS
  Administered 2015-04-02: 25 ug/h via INTRAVENOUS
  Administered 2015-04-02 – 2015-04-03 (×4): 250 ug/h via INTRAVENOUS
  Administered 2015-04-04: 150 ug/h via INTRAVENOUS
  Administered 2015-04-05: 75 ug/h via INTRAVENOUS
  Filled 2015-03-25 (×25): qty 50

## 2015-03-25 MED ORDER — ARTIFICIAL TEARS OP OINT
1.0000 "application " | TOPICAL_OINTMENT | Freq: Three times a day (TID) | OPHTHALMIC | Status: DC
  Administered 2015-03-25 – 2015-03-28 (×9): 1 via OPHTHALMIC
  Filled 2015-03-25 (×2): qty 3.5

## 2015-03-25 MED ORDER — SODIUM CHLORIDE 0.9 % IV SOLN
200.0000 mg | Freq: Once | INTRAVENOUS | Status: AC
  Administered 2015-03-25: 200 mg via INTRAVENOUS
  Filled 2015-03-25: qty 200

## 2015-03-25 MED ORDER — MIDAZOLAM BOLUS VIA INFUSION
2.0000 mg | INTRAVENOUS | Status: DC | PRN
  Administered 2015-03-28: 2 mg via INTRAVENOUS
  Filled 2015-03-25 (×2): qty 2

## 2015-03-25 MED ORDER — SODIUM CHLORIDE 0.9 % IV SOLN
100.0000 mg | INTRAVENOUS | Status: DC
  Administered 2015-03-26 – 2015-03-27 (×2): 100 mg via INTRAVENOUS
  Filled 2015-03-25 (×3): qty 100

## 2015-03-25 MED ORDER — VANCOMYCIN HCL IN DEXTROSE 750-5 MG/150ML-% IV SOLN: 750.0000 mg | INTRAVENOUS | Status: DC

## 2015-03-25 MED ORDER — SODIUM CHLORIDE 0.9 % IV SOLN
1.0000 mg/h | INTRAVENOUS | Status: DC
  Administered 2015-03-25: 6 mg/h via INTRAVENOUS
  Administered 2015-03-25: 2 mg/h via INTRAVENOUS
  Administered 2015-03-26: 6 mg/h via INTRAVENOUS
  Administered 2015-03-26: 8 mg/h via INTRAVENOUS
  Administered 2015-03-26: 6 mg/h via INTRAVENOUS
  Administered 2015-03-27 (×3): 8 mg/h via INTRAVENOUS
  Administered 2015-03-28 (×4): 9 mg/h via INTRAVENOUS
  Administered 2015-03-29 (×3): 8 mg/h via INTRAVENOUS
  Administered 2015-03-30: 7 mg/h via INTRAVENOUS
  Administered 2015-03-30 – 2015-03-31 (×2): 5 mg/h via INTRAVENOUS
  Administered 2015-03-31 – 2015-04-02 (×5): 4 mg/h via INTRAVENOUS
  Administered 2015-04-03 – 2015-04-04 (×2): 2 mg/h via INTRAVENOUS
  Administered 2015-04-06: 1 mg/h via INTRAVENOUS
  Filled 2015-03-25 (×27): qty 10

## 2015-03-25 MED ORDER — MIDAZOLAM HCL 2 MG/2ML IJ SOLN
2.0000 mg | Freq: Once | INTRAMUSCULAR | Status: AC | PRN
  Administered 2015-03-27: 2 mg via INTRAVENOUS

## 2015-03-25 MED ORDER — VANCOMYCIN HCL 10 G IV SOLR
2000.0000 mg | Freq: Once | INTRAVENOUS | Status: AC
  Administered 2015-03-25: 2000 mg via INTRAVENOUS
  Filled 2015-03-25: qty 2000

## 2015-03-25 MED ORDER — PROPOFOL 1000 MG/100ML IV EMUL
5.0000 ug/kg/min | INTRAVENOUS | Status: AC
  Administered 2015-03-25: 30 ug/kg/min via INTRAVENOUS

## 2015-03-25 MED ORDER — MIDAZOLAM HCL 2 MG/2ML IJ SOLN
2.0000 mg | Freq: Once | INTRAMUSCULAR | Status: AC
  Administered 2015-03-25: 2 mg via INTRAVENOUS

## 2015-03-25 MED ORDER — DEXTROSE 5 % IV SOLN
2.0000 g | INTRAVENOUS | Status: DC
  Administered 2015-03-25 – 2015-03-26 (×2): 2 g via INTRAVENOUS
  Filled 2015-03-25 (×3): qty 2

## 2015-03-25 MED ORDER — SODIUM CHLORIDE 0.9 % IV SOLN
3.0000 ug/kg/min | INTRAVENOUS | Status: DC
  Administered 2015-03-25 – 2015-03-26 (×3): 3 ug/kg/min via INTRAVENOUS
  Administered 2015-03-27 (×2): 3.5 ug/kg/min via INTRAVENOUS
  Administered 2015-03-28: 4 ug/kg/min via INTRAVENOUS
  Filled 2015-03-25 (×7): qty 20

## 2015-03-25 MED ORDER — FENTANYL BOLUS VIA INFUSION
50.0000 ug | INTRAVENOUS | Status: DC | PRN
  Administered 2015-03-28: 50 ug via INTRAVENOUS
  Filled 2015-03-25: qty 50

## 2015-03-25 MED ORDER — DOCUSATE SODIUM 50 MG/5ML PO LIQD
100.0000 mg | Freq: Every day | ORAL | Status: DC
  Administered 2015-03-25 – 2015-04-07 (×11): 100 mg
  Filled 2015-03-25 (×27): qty 10

## 2015-03-25 MED ORDER — FENTANYL CITRATE (PF) 100 MCG/2ML IJ SOLN
100.0000 ug | Freq: Once | INTRAMUSCULAR | Status: AC | PRN
  Administered 2015-03-27: 100 ug via INTRAVENOUS

## 2015-03-25 MED ORDER — METHYLPREDNISOLONE SODIUM SUCC 125 MG IJ SOLR
80.0000 mg | Freq: Three times a day (TID) | INTRAMUSCULAR | Status: DC
  Administered 2015-03-25 – 2015-03-31 (×18): 80 mg via INTRAVENOUS
  Filled 2015-03-25 (×12): qty 1.28
  Filled 2015-03-25: qty 2
  Filled 2015-03-25 (×6): qty 1.28

## 2015-03-25 NOTE — Care Management Important Message (Signed)
Important Message  Patient Details  Name: Juan Cameron MRN: [id number] Date of Birth: 1975/07/19   Medicare Important Message Given:  Other (see comment)    [name] 03/25/2015, 11:35 AM

## 2015-03-25 NOTE — Progress Notes (Signed)
VASCULAR LAB PRELIMINARY  PRELIMINARY  PRELIMINARY  PRELIMINARY  Bilateral upper extremity venous duplex completed.    Preliminary report:  Bilateral:  No evidence of DVT or superficial thrombosis.    Kemaria Dedic, RVS 03/25/2015, 1:46 PM

## 2015-03-25 NOTE — Progress Notes (Signed)
Patients peak pressure alarm is set at 55. RT does not feel comfortable going past 55. Patients peak pressure alarm continues to go off. Dr. Tyson AliasFeinstein is aware of the situation.

## 2015-03-25 NOTE — Progress Notes (Signed)
Patients FIO2 was increased to 70% due to spo2 dropping into the 80's. A recruitment maneuver was performed to help with spo2.

## 2015-03-25 NOTE — Progress Notes (Addendum)
PULMONARY / CRITICAL CARE MEDICINE   Name: Juan Cameron MRN: [id number] DOB: [birth year]    ADMISSION DATE:  03/11/2015  CONSULTATION DATE: 2/28  REFERRING MD: Triad  CHIEF COMPLAINT: resp failure  HISTORY OF PRESENT ILLNESS:  36 autistic male recently in hospital for multilobar pneumonia and dc'd on 6 l [place] and returned in 24 hours with sats in 50%. He was discharged on 03/10/15 after treatment for CAP, NOI, and was on 6 l [place]. [facility]) checked sats and he was 50% and therefore was transported back to [facility] and required 80% fio2 on NIVMS with RR 45. CXR consistent early ARDS, 2d echo with nl EF but grade 2 dysfunction. PCCM called to bedside on 3/1 with anticipation of requiring to be intubated.  SUBJECTIVE:  Worsening vent needs Code status reversal  VITAL SIGNS: BP 128/54 mmHg  Pulse 143  Temp(Src) 98.9 F (37.2 C) (Oral)  Resp 35  Ht 5' 9"  (1.753 m)  Wt 99 kg (218 lb 4.1 oz)  BMI 32.22 kg/m2  SpO2 87%  HEMODYNAMICS: CVP:  [10 mmHg-24 mmHg] 11 mmHg  VENTILATOR SETTINGS: Vent Mode:  [-] PRVC FiO2 (%):  [50 %-100 %] 100 % Set Rate:  [23 bmp-35 bmp] 35 bmp Vt Set:  [400 mL-490 mL] 490 mL PEEP:  [14 cmH20-20 cmH20] 20 cmH20 Plateau Pressure:  [30 cmH20-41 cmH20] 41 cmH20  INTAKE / OUTPUT: I/O last 3 completed shifts: In: 3662.7 [I.V.:2862.7; NG/GT:800] Out: 5125 [QAESL:7530]  PHYSICAL EXAMINATION: General: Sedated and paralyzed Neuro: Sedated and paralyzed, perr HEENT: [place]/AT, trach clean Cardiovascular: RRT s1 s2  Lungs: ronchi diffuse Abdomen: Obese , protrubent, hypoactive bowel sounds. Musculoskeletal: intact Skin: Warm, moist  LABS:  BMET  Recent Labs Lab 03/23/15 0418 03/24/15 0345 03/25/15 0400  NA 144 144 145  K 3.5 4.0 3.6  CL 99* 98* 97*  CO2 28 29 29   BUN 74* 90* 120*  CREATININE 3.51* 4.49* 5.46*  GLUCOSE 149* 120* 131*   Electrolytes  Recent Labs Lab 03/22/15 0400 03/23/15 0418 03/24/15 0345 03/25/15 0400  CALCIUM  8.6* 8.7* 8.9 9.0  MG 2.4 2.4 2.5*  --   PHOS 3.3 8.3* 7.1*  --    CBC  Recent Labs Lab 03/23/15 0418 03/24/15 0345 03/25/15 0400  WBC 19.5* 10.3 13.1*  HGB 9.1* 9.8* 8.9*  HCT 28.4* 31.2* 27.6*  PLT 119* 105* 158   Coag's  Recent Labs Lab 03/20/15 0354  APTT 33  INR 1.37   Sepsis Markers No results for input(s): LATICACIDVEN, PROCALCITON, O2SATVEN in the last 168 hours. ABG  Recent Labs Lab 03/24/15 1542 03/25/15 0525 03/25/15 0856  PHART 7.281* 7.299* 7.214*  PCO2ART 73.5* 66.9* 80.0*  PO2ART 94.0 67.0* 66.0*   Liver Enzymes  Recent Labs Lab 03/25/15 0400  AST 36  ALT 35  ALKPHOS 86  BILITOT 1.2  ALBUMIN 2.4*    Cardiac Enzymes  Recent Labs Lab 03/22/15 1725 03/22/15 2304 03/23/15 0418  TROPONINI 0.08* 0.11* 0.07*   Glucose  Recent Labs Lab 03/24/15 1132 03/24/15 1619 03/24/15 2045 03/24/15 2335 03/25/15 0348 03/25/15 0824  GLUCAP 124* 143* 123* 105* 115* 148*   Imaging Dg Chest Port 1 View  03/25/2015  CLINICAL DATA:  Chronic ventilator dependent respiratory failure. Followup pneumonia. EXAM: PORTABLE CHEST 1 VIEW COMPARISON:  03/24/2015 and earlier. FINDINGS: Tracheostomy tube tip in satisfactory position below the thoracic inlet. Nasogastric tube courses below the diaphragm into the stomach. Left jugular central venous catheter tip projects likely at the junction of the  left subclavian vein and left innominate vein, unchanged. Since yesterday, marked progression of airspace opacities throughout both lungs, most confluent in the right upper lobe and in the left lower lobe. Possible small bilateral pleural effusions. Cardiac silhouette mildly enlarged, unchanged. IMPRESSION: 1. Support apparatus as before. 2. Marked worsening of pneumonia throughout both lungs since yesterday, most confluent in the right upper lobe and left lower lobe. Electronically Signed   By: Evangeline Dakin M.D.   On: 03/25/2015 08:08   CULTURES: 2/28 bc x 2  negative 2/28 uc negative 2/28 RVP negative Sputum culture 3/4, 3/5, 3/9 >>yeast Blood culture 3/4 >>  ANTIBIOTICS: 2/28 vanc>>3/10 2/28 cefepime>>3/10  SIGNIFICANT EVENTS: 2/28 readmit to hospital  03/12/15 Intubated 3/1 Bronchoscopy   LINES/TUBES: Central line L IJ TLC>3/1 ETT out Trach 3/9>>>   ASSESSMENT / PLAN:  PULMONARY A: Acute hypoxic resp failure. Suspected recurrent pna/HCAP/ asp pna.  Worsening ards Suspected undiagnosed OSA P:  Much worse, pcxr and status on vent, attempted PC V failed Back to prvc, peep to 20, keeping high rate 7 cc/kg for now to avoid arrest, then re reduce in afternoon to 6 cc/kg Plat goal met overall less 30 Attempted but failed inverse ratio Poor prognosis, will again re discuss code status pcxr in am  Start empiric steroids Neg balance was obtain but he worsened despite Permissive hypercapnia oKay  CARDIOVASCULAR A:  Septic Shock -- resolved. P:  tele  RENAL Lab Results  Component Value Date   CREATININE 5.46* 03/25/2015   CREATININE 4.49* 03/24/2015   CREATININE 3.51* 03/23/2015  A:  Acute Kidney Injury P:  cvp 11, cosnider maintain pos balance Saline to 100 bmet in am   GASTROINTESTINAL A:  Await BM P:  Cont TF NGT. mir, colace dulc supp  HEMATOLOGIC A:  Hx of sickle cell trait VTE  P:  Cbc in am  Continue SQ heparin.  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 Cultures (-) so far.  At risk VAP now P:  Follow CXR. Add anidulofungin Much worse, add empiric vap coverage  ENDOCRINE A:  hyperglycemia  P:  ISS ordered moderate.controlled  NEUROLOGIC A:  Mentally impaired with limited vocabulary of 500 words.  Autism  Decreased LOC from metabolic disarray. P:  RASS goal: -5 fentanyl and nimbex. Change verse to prop, see TG  Sister updated by me Ccm time 35 min   Lavon Paganini. Titus Mould, MD, FACP Pgr:  [facility]    I have had extensive discussions with family sister again. We discussed patients current circumstances and organ failures. We also discussed patient's prior wishes under circumstances such as this. Family has decided to NOT perform resuscitation if arrest but to continue current medical support for now.

## 2015-03-25 NOTE — Progress Notes (Signed)
RT manually ventilated patient with a peep of 20 in order to lavage him to clear secretions. RT only obtained saline back and a small amount of pink tinged secretions.

## 2015-03-25 NOTE — Progress Notes (Signed)
Patient ID: Alan RipperRobert Wind, male   DOB: Nov 22, 1975, 40 y.o.   MRN: [id number]         [facility]    Date of Admission:  03/11/2015     Principal Problem:   Acute respiratory failure with hypoxia (HCC) Active Problems:   HCAP (healthcare-associated pneumonia)   ARDS (adult respiratory distress syndrome) (HCC)   Fever   Acute renal insufficiency   Autism   Acute lung injury   Leukocytosis   Acute hyperglycemia   Left ventricular diastolic dysfunction, NYHA class 2   Thrombocytopenia (HCC)   Obesity   Acute respiratory failure (HCC)   . [START ON 03/26/2015] anidulafungin  100 mg Intravenous Q24H  . anidulafungin  200 mg Intravenous Once  . antiseptic oral rinse  7 mL Mouth Rinse QID  . artificial tears  1 application Both Eyes 3 times per day  . cefTAZidime (FORTAZ)  IV  2 g Intravenous Q24H  . chlorhexidine gluconate  15 mL Mouth Rinse BID  . docusate  100 mg Per Tube Daily  . feeding supplement (PRO-STAT SUGAR FREE 64)  30 mL Per Tube BID  . feeding supplement (VITAL HIGH PROTEIN)  1,000 mL Per Tube Q24H  . free water  250 mL Per Tube Q6H  . heparin subcutaneous  5,000 Units Subcutaneous 3 times per day  . insulin aspart  2-6 Units Subcutaneous 6 times per day  . ipratropium-albuterol  3 mL Nebulization Q4H  . methylPREDNISolone (SOLU-MEDROL) injection  80 mg Intravenous 3 times per day  . pantoprazole sodium  40 mg Per Tube Q1200  . polyethylene glycol  17 g Oral Daily  . sodium chloride flush  3 mL Intravenous Q12H  . vancomycin  2,000 mg Intravenous Once   Review of Systems: Review of Systems  Unable to perform ROS: intubated    Past Medical History  Diagnosis Date  . Autism   . Sickle cell trait (HCC)   . Pneumonia 02/2015  . Autism     Social History  Substance Use Topics  . Smoking status: Never Smoker   . Smokeless tobacco: Never Used  . Alcohol Use: No    Family History  Problem Relation Age of Onset  . Heart attack  Mother    Allergies  Allergen Reactions  . Peanuts [Peanut Oil] Anaphylaxis    OBJECTIVE: Filed Vitals:   03/25/15 1100 03/25/15 1200 03/25/15 1207 03/25/15 1249  BP: 128/54 116/61  111/65  Pulse: 143 144  146  Temp:   101.4 F (38.6 C)   TempSrc:   Axillary   Resp: 35 35  35  Height:      Weight:      SpO2: 87% 89%  90%   Body mass index is 32.22 kg/(m^2).  Physical Exam  Constitutional:  He remains sedated on the ventilator.  Eyes: Conjunctivae are normal.  Cardiovascular: Normal rate and regular rhythm.   No murmur heard. Tachycardic.  Pulmonary/Chest:  Coarse breath sounds  Abdominal: Soft. He exhibits distension.  No diarrhea.  Skin: No rash noted.  Left neck and left arm IV sites okay.    Lab Results Lab Results  Component Value Date   WBC 13.1* 03/25/2015   HGB 8.9* 03/25/2015   HCT 27.6* 03/25/2015   MCV 80.9 03/25/2015   PLT 158 03/25/2015    Lab Results  Component Value Date   CREATININE 5.46* 03/25/2015   BUN 120* 03/25/2015   NA 145 03/25/2015   K 3.6  03/25/2015   CL 97* 03/25/2015   CO2 29 03/25/2015    Lab Results  Component Value Date   ALT 35 03/25/2015   AST 36 03/25/2015   ALKPHOS 86 03/25/2015   BILITOT 1.2 03/25/2015     Microbiology: Recent Results (from the past 240 hour(s))  Culture, blood (routine x 2)     Status: None   Collection Time: 03/15/15  2:45 PM  Result Value Ref Range Status   Specimen Description BLOOD RIGHT ANTECUBITAL  Final   Special Requests BOTTLES DRAWN AEROBIC AND ANAEROBIC 10CC  Final   Culture NO GROWTH 5 DAYS  Final   Report Status 03/20/2015 FINAL  Final  Culture, blood (routine x 2)     Status: None   Collection Time: 03/15/15  3:00 PM  Result Value Ref Range Status   Specimen Description BLOOD BLOOD RIGHT HAND  Final   Special Requests BOTTLES DRAWN AEROBIC AND ANAEROBIC 10CC  Final   Culture NO GROWTH 5 DAYS  Final   Report Status 03/20/2015 FINAL  Final  Culture, respiratory  (NON-Expectorated)     Status: None   Collection Time: 03/15/15  3:10 PM  Result Value Ref Range Status   Specimen Description SPUTUM  Final   Special Requests NONE  Final   Gram Stain   Final    FEW WBC PRESENT,BOTH PMN AND MONONUCLEAR NO SQUAMOUS EPITHELIAL CELLS SEEN RARE YEAST THIS SPECIMEN IS ACCEPTABLE FOR SPUTUM CULTURE Performed at [facility]    Culture   Final    MODERATE YEAST Performed at [facility]    Report Status 03/18/2015 FINAL  Final  Gram stain     Status: None   Collection Time: 03/16/15 11:28 AM  Result Value Ref Range Status   Specimen Description TRACHEAL SITE  Final   Special Requests NONE  Final   Gram Stain   Final    FEW WBC PRESENT,BOTH PMN AND MONONUCLEAR FEW YEAST    Report Status 03/16/2015 FINAL  Final  Culture, respiratory (NON-Expectorated)     Status: None   Collection Time: 03/16/15 11:28 AM  Result Value Ref Range Status   Specimen Description TRACHEAL ASPIRATE  Final   Special Requests Normal  Final   Gram Stain   Final    FEW WBC PRESENT,BOTH PMN AND MONONUCLEAR FEW YEAST Performed at [facility] Performed at [facility]    Culture   Final    MODERATE YEAST Performed at [facility]    Report Status 03/18/2015 FINAL  Final  Culture, respiratory (NON-Expectorated)     Status: None   Collection Time: 03/20/15  3:08 PM  Result Value Ref Range Status   Specimen Description BRONCHIAL ALVEOLAR LAVAGE  Final   Special Requests NONE  Final   Gram Stain   Final    MODERATE WBC PRESENT, PREDOMINANTLY PMN NO SQUAMOUS EPITHELIAL CELLS SEEN NO ORGANISMS SEEN Performed at [facility]    Culture   Final    FEW YEAST CONSISTENT WITH CANDIDA SPECIES Performed at [facility]    Report Status 03/23/2015 FINAL  Final  Culture, Urine     Status: None   Collection Time: 03/21/15  3:19 PM  Result Value Ref Range Status   Specimen Description URINE, RANDOM  Final   Special  Requests NONE  Final   Culture NO GROWTH 1 DAY  Final   Report Status 03/22/2015 FINAL  Final  Culture, blood (routine x 2)  Status: None (Preliminary result)   Collection Time: 03/21/15  6:19 PM  Result Value Ref Range Status   Specimen Description BLOOD RIGHT ANTECUBITAL  Final   Special Requests BOTTLES DRAWN AEROBIC AND ANAEROBIC 10CC  Final   Culture NO GROWTH 4 DAYS  Final   Report Status PENDING  Incomplete  Culture, blood (routine x 2)     Status: None (Preliminary result)   Collection Time: 03/21/15  6:33 PM  Result Value Ref Range Status   Specimen Description BLOOD RIGHT HAND  Final   Special Requests BOTTLES DRAWN AEROBIC ONLY 10CC  Final   Culture NO GROWTH 4 DAYS  Final   Report Status PENDING  Incomplete     ASSESSMENT: His bilateral pulmonary infiltrates and hypoxemic respiratory failure continued to worsen. He has grown yeast from 3 recent sputum cultures. This probably represents asymptomatic colonization but he is desperately ill. After discussion with Dr. Tyson Alias house in total agreement with restarting broad empiric antibiotic therapy for possible recurrent HCAP.  PLAN: 1. Agree with starting vancomycin, ceftazidime and anidulafungin  Cliffton Asters, MD [facility] [phone] pager   [phone] cell 03/25/2015, 1:50 PM

## 2015-03-25 NOTE — Progress Notes (Signed)
Pharmacy Antibiotic Note  Juan RipperRobert Cameron is a 40 y.o. male admitted on 03/11/2015 with pneumonia/HCAP.  Pharmacy has been consulted for Vancomycin/ceftazidime/anidulafungin dosing. Pt acutely decompensating and restarting abx. S/p vanc/cefepime x 11 days, stopped on 3/11. ID discontinued abx to observe patient off abx.  Plan: Vancomycin 2000 IV once, followed by 1500 q 48 hr.  Goal trough 15-20 mcg/mL. Ceftazidime 2 grams q 24 hours Anidulafungin 200 mg x 1, followed by 100 mg daily Monitor clinical progress, c/s, renal function, abx plan/LOT VT@SS  as indicated  Height: 5\' 9"  (175.3 cm) Weight: 218 lb 4.1 oz (99 kg) IBW/kg (Calculated) : 70.7  Temp (24hrs), Avg:98 F (36.7 C), Min:96.7 F (35.9 C), Max:98.9 F (37.2 C)   Recent Labs Lab 03/19/15 0120  03/21/15 0515 03/22/15 0400 03/23/15 0418 03/24/15 0345 03/25/15 0400  WBC  --   < > 17.2* 16.0* 19.5* 10.3 13.1*  CREATININE  --   < > 2.58* 3.05* 3.51* 4.49* 5.46*  VANCOTROUGH 32*  --   --   --   --   --   --   < > = values in this interval not displayed.  Estimated Creatinine Clearance: 21.5 mL/min (by C-G formula based on Cr of 5.46).    Allergies  Allergen Reactions  . Peanuts [Peanut Oil] Anaphylaxis    Antimicrobials this admission: Cefepime 2/28 >> 3/7,  3/9 >> 3/11 Vanc 2/28 >> 3/4, resumed 3/5 >> 3/11, resuming 3/14 Anidulifungin 3/14>> Ceftazadime 3/14>>   Dose adjustments this admission: none  Microbiology results: 2/28 blood cx, urine cx, RVP, Flu PCR, strep UA, Legionella, MRSA PCR - negative 3/4 BCx x2 - negF 3/4 resp cx: mod yeast 3/5 resp cx: mod yeast 3/9 resp cx: few yeast   Thank you for allowing us to participate in this patients care. Signe Coltonya C Maeryn Mcgath, PharmD Pager: [phone] 03/25/2015 11:32 AM

## 2015-03-25 NOTE — Progress Notes (Signed)
Rt performed ABG due to desaturations and no gas pulled since changes on the former day.  Gas reported to RN, and to Dr. Kendrick FriesMcQuaid that informed RT to keep settings the same on vent.  Rt will monitor.

## 2015-03-26 ENCOUNTER — Inpatient Hospital Stay (HOSPITAL_COMMUNITY): Payer: Medicare Other

## 2015-03-26 LAB — BLOOD GAS, ARTERIAL
Acid-base deficit: 1.7 mmol/L (ref 0.0–2.0)
Bicarbonate: 26 meq/L — ABNORMAL HIGH (ref 20.0–24.0)
Drawn by: 398661
FIO2: 1
MECHVT: 400 mL
O2 Saturation: 97.1 %
PEEP: 22 cmH2O
Patient temperature: 98.6
RATE: 35 {breaths}/min
TCO2: 28.3 mmol/L (ref 0–100)
pCO2 arterial: 75.6 mmHg (ref 35.0–45.0)
pH, Arterial: 7.163 — CL (ref 7.350–7.450)
pO2, Arterial: 118 mmHg — ABNORMAL HIGH (ref 80.0–100.0)

## 2015-03-26 LAB — GLUCOSE, CAPILLARY
GLUCOSE-CAPILLARY: 155 mg/dL — AB (ref 65–99)
GLUCOSE-CAPILLARY: 171 mg/dL — AB (ref 65–99)
Glucose-Capillary: 144 mg/dL — ABNORMAL HIGH (ref 65–99)
Glucose-Capillary: 151 mg/dL — ABNORMAL HIGH (ref 65–99)
Glucose-Capillary: 160 mg/dL — ABNORMAL HIGH (ref 65–99)
Glucose-Capillary: 220 mg/dL — ABNORMAL HIGH (ref 65–99)

## 2015-03-26 LAB — CBC WITH DIFFERENTIAL/PLATELET
Basophils Absolute: 0 K/uL (ref 0.0–0.1)
Basophils Relative: 0 %
Eosinophils Absolute: 0 K/uL (ref 0.0–0.7)
Eosinophils Relative: 0 %
HCT: 26.4 % — ABNORMAL LOW (ref 39.0–52.0)
Hemoglobin: 8.8 g/dL — ABNORMAL LOW (ref 13.0–17.0)
Lymphocytes Relative: 4 %
Lymphs Abs: 0.9 K/uL (ref 0.7–4.0)
MCH: 27.2 pg (ref 26.0–34.0)
MCHC: 33.3 g/dL (ref 30.0–36.0)
MCV: 81.7 fL (ref 78.0–100.0)
Monocytes Absolute: 1.3 K/uL — ABNORMAL HIGH (ref 0.1–1.0)
Monocytes Relative: 7 %
Neutro Abs: 17.4 K/uL — ABNORMAL HIGH (ref 1.7–7.7)
Neutrophils Relative %: 89 %
Platelets: 188 K/uL (ref 150–400)
RBC: 3.23 MIL/uL — ABNORMAL LOW (ref 4.22–5.81)
RDW: 15.5 % (ref 11.5–15.5)
WBC: 19.6 K/uL — ABNORMAL HIGH (ref 4.0–10.5)

## 2015-03-26 LAB — COMPREHENSIVE METABOLIC PANEL WITH GFR
ALT: 35 U/L (ref 17–63)
AST: 54 U/L — ABNORMAL HIGH (ref 15–41)
Albumin: 2.2 g/dL — ABNORMAL LOW (ref 3.5–5.0)
Alkaline Phosphatase: 110 U/L (ref 38–126)
Anion gap: 22 — ABNORMAL HIGH (ref 5–15)
BUN: 153 mg/dL — ABNORMAL HIGH (ref 6–20)
CO2: 24 mmol/L (ref 22–32)
Calcium: 8 mg/dL — ABNORMAL LOW (ref 8.9–10.3)
Chloride: 98 mmol/L — ABNORMAL LOW (ref 101–111)
Creatinine, Ser: 7.51 mg/dL — ABNORMAL HIGH (ref 0.61–1.24)
GFR calc Af Amer: 10 mL/min — ABNORMAL LOW
GFR calc non Af Amer: 8 mL/min — ABNORMAL LOW
Glucose, Bld: 169 mg/dL — ABNORMAL HIGH (ref 65–99)
Potassium: 5.4 mmol/L — ABNORMAL HIGH (ref 3.5–5.1)
Sodium: 144 mmol/L (ref 135–145)
Total Bilirubin: 2.1 mg/dL — ABNORMAL HIGH (ref 0.3–1.2)
Total Protein: 6.4 g/dL — ABNORMAL LOW (ref 6.5–8.1)

## 2015-03-26 LAB — CULTURE, BLOOD (ROUTINE X 2)
CULTURE: NO GROWTH
Culture: NO GROWTH

## 2015-03-26 MED ORDER — FUROSEMIDE 10 MG/ML IJ SOLN
160.0000 mg | Freq: Three times a day (TID) | INTRAVENOUS | Status: DC
  Administered 2015-03-26 – 2015-03-27 (×3): 160 mg via INTRAVENOUS
  Filled 2015-03-26 (×5): qty 16

## 2015-03-26 MED ORDER — SODIUM POLYSTYRENE SULFONATE 15 GM/60ML PO SUSP
45.0000 g | Freq: Once | ORAL | Status: AC
  Administered 2015-03-26: 45 g
  Filled 2015-03-26: qty 180

## 2015-03-26 MED ORDER — ACETYLCYSTEINE 20 % IN SOLN
4.0000 mL | Freq: Two times a day (BID) | RESPIRATORY_TRACT | Status: DC
  Administered 2015-03-26 – 2015-03-28 (×4): 4 mL via RESPIRATORY_TRACT
  Filled 2015-03-26 (×7): qty 4

## 2015-03-26 MED ORDER — LACTULOSE 10 GM/15ML PO SOLN
30.0000 g | Freq: Two times a day (BID) | ORAL | Status: DC
  Administered 2015-03-26: 30 g
  Filled 2015-03-26 (×3): qty 45

## 2015-03-26 MED ORDER — SODIUM BICARBONATE 8.4 % IV SOLN
INTRAVENOUS | Status: AC
  Filled 2015-03-26: qty 50

## 2015-03-26 MED ORDER — SODIUM BICARBONATE 8.4 % IV SOLN
50.0000 meq | Freq: Once | INTRAVENOUS | Status: AC
  Administered 2015-03-26: 50 meq via INTRAVENOUS

## 2015-03-26 NOTE — Progress Notes (Signed)
PULMONARY / CRITICAL CARE MEDICINE   Name: Juan Cameron MRN: [id number] DOB: [birth year]    ADMISSION DATE:  03/11/2015  CONSULTATION DATE: 2/28  REFERRING MD: Triad  CHIEF COMPLAINT: resp failure  HISTORY OF PRESENT ILLNESS:  72 autistic male recently in hospital for multilobar pneumonia and dc'd on 6 l [place] and returned in 24 hours with sats in 50%. He was discharged on 03/10/15 after treatment for CAP, NOI, and was on 6 l [place]. [facility]) checked sats and he was 50% and therefore was transported back to [facility] and required 80% fio2 on NIVMS with RR 45. CXR consistent early ARDS, 2d echo with nl EF but grade 2 dysfunction. PCCM called to bedside on 3/1 with anticipation of requiring to be intubated.  SUBJECTIVE:  Worsening vent needs, DNR again, acidosis Actively dying   VITAL SIGNS: BP 116/59 mmHg  Pulse 135  Temp(Src) 101.9 F (38.8 C) (Oral)  Resp 35  Ht  (1.753 m)  Wt 101.4 kg (223 lb 8.7 oz)  BMI 33.00 kg/m2  SpO2 98%  HEMODYNAMICS: CVP:  [10 mmHg-17 mmHg] 16 mmHg  VENTILATOR SETTINGS: Vent Mode:  [-] PRVC FiO2 (%):  [90 %-100 %] 90 % Set Rate:  [28 bmp-35 bmp] 35 bmp Vt Set:  [400 mL-490 mL] 400 mL PEEP:  [18 cmH20-22 cmH20] 22 cmH20 Plateau Pressure:  [30 cmH20-49 cmH20] 39 cmH20  INTAKE / OUTPUT: I/O last 3 completed shifts: In: 8152.3 [I.V.:4512.3; NG/GT:2830; IV Piggyback:810] Out: 3105 [Urine:3105]  PHYSICAL EXAMINATION: General: Sedated and paralyzed Neuro: Sedated and paralyzed, perr HEENT: [place]/AT, trach wnl Cardiovascular: RRT s1 s2  Lungs: ronchi diffuse no changes Abdomen: Obese, NT, ND, hypoactive bowel sounds. Musculoskeletal: intact Skin: Warm, moist  LABS:  BMET  Recent Labs Lab 03/24/15 0345 03/25/15 0400 03/26/15 0715  NA 144 145 144  K 4.0 3.6 5.4*  CL 98* 97* 98*  CO2 BUN 90* 120* 153*  CREATININE 4.49* 5.46* 7.51*  GLUCOSE 120* 131* 169*   Electrolytes  Recent Labs Lab 03/22/15 0400  03/23/15 0418 03/24/15 0345 03/25/15 0400 03/26/15 0715  CALCIUM 8.6* 8.7* 8.9 9.0 8.0*  MG 2.4 2.4 2.5*  --   --   PHOS 3.3 8.3* 7.1*  --   --    CBC  Recent Labs Lab 03/24/15 0345 03/25/15 0400 03/26/15 0715  WBC 10.3 13.1* 19.6*  HGB 9.8* 8.9* 8.8*  HCT 31.2* 27.6* 26.4*  PLT 105* 158 188   Coag's  Recent Labs Lab 03/20/15 0354  APTT 33  INR 1.37   Sepsis Markers No results for input(s): LATICACIDVEN, PROCALCITON, O2SATVEN in the last 168 hours. ABG  Recent Labs Lab 03/25/15 0525 03/25/15 0856 03/26/15 0330  PHART 7.299* 7.214* 7.163*  PCO2ART 66.9* 80.0* 75.6*  PO2ART 67.0* 66.0* 118*   Liver Enzymes  Recent Labs Lab 03/25/15 0400 03/26/15 0715  AST 36 54*  ALT 35 35  ALKPHOS 86 110  BILITOT 1.2 2.1*  ALBUMIN 2.4* 2.2*    Cardiac Enzymes  Recent Labs Lab 03/22/15 1725 03/22/15 2304 03/23/15 0418  TROPONINI 0.08* 0.11* 0.07*   Glucose  Recent Labs Lab 03/25/15 0348 03/25/15 0824 03/25/15 1205 03/25/15 1952 03/25/15 2316 03/26/15 0356  GLUCAP 115* 148* 153* 168* 144* 151*   Imaging Dg Chest Port 1 View  03/26/2015  CLINICAL DATA:  ARDS EXAM: PORTABLE CHEST 1 VIEW COMPARISON:  March 25, 2015 FINDINGS: Tracheostomy catheter tip is 5.2 cm above the carina. Left jugular catheter tip is in  the left jugular vein near the junction with the left innominate vein, unchanged. Nasogastric tube tip and side port are below the diaphragm. No pneumothorax. Interstitial edema persists without appreciable change. Airspace consolidation in the right upper lobe persists. There appears to be a degree of partial clearing from the lung bases bilaterally. Localized consolidation medial portion of the right base persists without change. No new opacity is evident on either side. Heart is borderline enlarged with the pulmonary vascularity indicating a degree of pulmonary venous hypertension. No adenopathy evident. IMPRESSION: Partial clearing of airspace  consolidation from the lung bases. Persistent airspace consolidation right upper lobe and medial right base. Underlying interstitial edema. No new opacity evident on either side. No change in cardiac silhouette. No pneumothorax. Electronically Signed   By: Bretta BangWilliam  Woodruff III M.D.   On: 03/26/2015 07:41   CULTURES: 2/28 bc x 2 negative 2/28 uc negative 2/28 RVP negative Sputum culture 3/4, 3/5, 3/9 >>yeast Blood culture 3/4 >>  ANTIBIOTICS: 2/28 vanc>>3/10 2/28 cefepime>>3/10 3/14 ceftaz>>> 3/14 vanc>>> 3/14 anidulo>>>  SIGNIFICANT EVENTS: 2/28 readmit to hospital  03/12/15 Intubated 3/1 Bronchoscopy  3/14- unable to ventilate or oxygenate< DNR  LINES/TUBES: Central line L IJ TLC>3/1 ETT out Trach 3/9>>>   ASSESSMENT / PLAN:  PULMONARY A: Acute hypoxic resp failure. Suspected recurrent pna/HCAP/ asp pna.  Worsening ards, refractory R/o VAP R/o fungal component Suspected undiagnosed OSA pulm edema component rul ATX P:  tv at 6 cc/kg, rate max 35, 90%, peep 22, abg reviewed Failed PCV No role aprv Attempting to keep pressures less 30 pcxr follow up in am Chest pt to rul Add mucomysts Bicarb will not change outcome and will increase co2 - no role pao2 118, reduce to goal 70% if able then to peep 20 Neg balance as able  CARDIOVASCULAR A:  Septic Shock -- resolved. P:  tele  RENAL Lab Results  Component Value Date   CREATININE 7.51* 03/26/2015   CREATININE 5.46* 03/25/2015   CREATININE 4.49* 03/24/2015  A:  Acute Kidney Injury, ARF P:  Lasix high dose to neg balance NOT a candidate for HD, will not change outcome , would be painful and futile Chem, mg, phos in am  kayx  X 1 Dnr, no heroics  GASTROINTESTINAL A:  Await BM P:  Cont TF NGT. mir, colace dulc supp Enema if able Lactulose add  HEMATOLOGIC A:  Hx of sickle cell trait VTE  P:  Continue SQ heparin.  INFECTIOUS A:  Recurrent PNA with suspected  ARDS Leukocytosis Fever this am. Cultures (-) so far.  At risk VAP now P:  Continued current coverage  ENDOCRINE A:  hyperglycemia  P:  ISS ordered moderate.controlled  NEUROLOGIC A:  Mentally impaired with limited vocabulary of 500 words.  Autism  Decreased LOC from metabolic disarray. P:  RASS goal: -5 Versed, fentanyl and nimbex unable to stop  wil again talk about comfort care Ccm time 35 min   Mcarthur Rossettianiel J. Tyson AliasFeinstein, MD, FACP Pgr: [phone] [place] [facility]

## 2015-03-26 NOTE — Progress Notes (Signed)
Pharmacy Antibiotic Note  Alan RipperRobert Boeckman is a 40 y.o. male admitted on 03/11/2015 with pneumonia/HCAP.  Pharmacy has been consulted for Vancomycin/ceftazidime/anidulafungin dosing. Tmax is 101.9 and WBC is elevated at 19.6. SCr continues to increase rapidly.   Plan: - Hold vanc - check a random level in the morning to assess need for further doses - Continue ceftaz 2gm IV Q24H - may need to decrease this tomorrow - Continue eraxis 100mg  IV daily - F/u renal fxn, C&S, clinical status and vanc levels PRN - F/u care plan  Height: 5\' 9"  (175.3 cm) Weight: 223 lb 8.7 oz (101.4 kg) IBW/kg (Calculated) : 70.7  Temp (24hrs), Avg:100.6 F (38.1 C), Min:100 F (37.8 C), Max:101.9 F (38.8 C)   Recent Labs Lab 03/22/15 0400 03/23/15 0418 03/24/15 0345 03/25/15 0400 03/26/15 0715  WBC 16.0* 19.5* 10.3 13.1* 19.6*  CREATININE 3.05* 3.51* 4.49* 5.46* 7.51*    Estimated Creatinine Clearance: 15.8 mL/min (by C-G formula based on Cr of 7.51).    Allergies  Allergen Reactions  . Peanuts [Peanut Oil] Anaphylaxis    Antimicrobials this admission: Cefepime 2/28 >> 3/7,  3/9 >> 3/11 Vanc 2/28 >> 3/4; 3/5 >> 3/11; 3/14>> Anidulifungin 3/14>> Ceftazadime 3/14>>  Microbiology results: 2/28 blood cx, urine cx, RVP, Flu PCR, strep UA, Legionella, MRSA PCR - negative 3/4 BCx x2 - NEG 3/4 resp cx: mod yeast 3/5 resp cx: mod yeast 3/9 resp cx: few yeast 3/10 Blood - NGTD 3/10 Urine - NEG  Lysle Pearlachel Jenna Routzahn, PharmD, BCPS Pager # [phone] 03/26/2015 1:47 PM

## 2015-03-26 NOTE — Progress Notes (Signed)
eLink Physician-Brief Progress Note Patient Name: Juan RipperRobert Cameron DOB: [birth year] MRN: [id number]   Date of Service  03/26/2015  HPI/Events of Note  Pt with worsening abg (7.16/80), on peep of 22, fio2 100%  eICU Interventions  1amp bicarb Try to keep net negative balance      Intervention Category Major Interventions: Acid-Base disturbance - evaluation and management  Baylon Santelli 03/26/2015, 3:43 AM

## 2015-03-26 NOTE — Progress Notes (Signed)
CSW continues to follow for disposition. Patient remains intubated and trached. MD is planning to discuss comfort care measures with the family.  Noe GensAshley Gardner, LCSW [facility] Clinical Social Worker [phone]

## 2015-03-26 NOTE — Progress Notes (Signed)
Patient ID: Juan RipperRobert Dimitrov, male   DOB: Oct 07, 1975, 40 y.o.   MRN: [id number]         [facility]     Date of Admission:  03/11/2015           Day 2 vancomycin, cefepime and anidulafungin  Mr. [name] continues to do extremely poorly. He has multiorgan failure. He is now on his third course of broad empiric antibiotic therapy over the past month. No definitive cause for his respiratory failure has been found and he is becoming progressively acidotic with worsening renal failure. It is very unlikely that he will survive. There are no new culture data to help guide antibiotic management. I will sign off now but please call if we can be of any further assistance.       Cliffton AstersJohn Nakhi Choi, MD [facility] [phone] pager   [phone] cell 01/14/2015, 1:32 PM

## 2015-03-26 NOTE — Progress Notes (Signed)
Nutrition Follow-up  DOCUMENTATION CODES:   Obesity unspecified  INTERVENTION:    Continue Vital High Protein via NGT at 50 ml/h (1200 ml/day) with Prostat 30 ml BID to provide 1400 kcals, 135 gm protein, 1003 ml free water daily  NUTRITION DIAGNOSIS:   Inadequate oral intake related to inability to eat as evidenced by NPO status.  Ongoing  GOAL:   Provide needs based on ASPEN/SCCM guidelines  Met  MONITOR:   Vent status, Labs, Weight trends, Skin, I & O's  ASSESSMENT:   40 yo autistic with limited vocabulary, recently admitted 2/17-2/27 for severe CAP.  CT scan negative for pulmonary embolism and showing bilateral lower lobe consolidation with patchy upper lobe infiltrates, rapid flu test was negative as well as urine strep antigen.  Echo showed diastolic dysfunction.  Pt had difficulty getting oxygen upon return home, his sister- POA, noted increasing hypoxia and brought him back to hospital.  Patient is currently intubated on ventilator support Temp (24hrs), Avg:100.6 F (38.1 C), Min:100 F (37.8 C), Max:101.9 F (38.8 C)   Patient is currently receiving Vital High Protein via NGT at 50 ml/h (1200 ml/day) with Prostat 30 ml BID to provide 1400 kcals, 135 gm protein, 1003 ml free water daily. Tolerating well to meet nutrition goal.  Diet Order:  Diet NPO time specified  Skin:  Reviewed, no issues  Last BM:  3/11  Height:   Ht Readings from Last 1 Encounters:  03/25/15 5' 9"  (1.753 m)    Weight:   Wt Readings from Last 1 Encounters:  03/26/15 223 lb 8.7 oz (101.4 kg)    Ideal Body Weight:  67.3 kg  BMI:  Body mass index is 33 kg/(m^2).  Estimated Nutritional Needs:   Kcal:  6754-4920  Protein:  >/= 135 grams  Fluid:  1.5 L  EDUCATION NEEDS:   No education needs identified at this time  Molli Barrows, [place], [place], [place] Pager [phone] After Hours Pager [phone]

## 2015-03-27 ENCOUNTER — Inpatient Hospital Stay (HOSPITAL_COMMUNITY): Payer: Medicare Other

## 2015-03-27 LAB — BASIC METABOLIC PANEL
Anion gap: 26 — ABNORMAL HIGH (ref 5–15)
BUN: 210 mg/dL — AB (ref 6–20)
CHLORIDE: 98 mmol/L — AB (ref 101–111)
CO2: 24 mmol/L (ref 22–32)
Calcium: 8 mg/dL — ABNORMAL LOW (ref 8.9–10.3)
Creatinine, Ser: 8.88 mg/dL — ABNORMAL HIGH (ref 0.61–1.24)
GFR calc Af Amer: 8 mL/min — ABNORMAL LOW (ref >60)
GFR calc non Af Amer: 7 mL/min — ABNORMAL LOW (ref >60)
GLUCOSE: 174 mg/dL — AB (ref 65–99)
POTASSIUM: 5.2 mmol/L — AB (ref 3.5–5.1)
SODIUM: 148 mmol/L — AB (ref 135–145)

## 2015-03-27 LAB — URINALYSIS, ROUTINE W REFLEX MICROSCOPIC
BILIRUBIN URINE: NEGATIVE
Glucose, UA: NEGATIVE mg/dL
KETONES UR: NEGATIVE mg/dL
LEUKOCYTES UA: NEGATIVE
NITRITE: NEGATIVE
PROTEIN: 30 mg/dL — AB
Specific Gravity, Urine: 1.013 (ref 1.005–1.030)
pH: 5 (ref 5.0–8.0)

## 2015-03-27 LAB — URINE MICROSCOPIC-ADD ON

## 2015-03-27 LAB — POCT I-STAT 3, ART BLOOD GAS (G3+)
Acid-base deficit: 3 mmol/L — ABNORMAL HIGH (ref 0.0–2.0)
Bicarbonate: 26.2 mEq/L — ABNORMAL HIGH (ref 20.0–24.0)
O2 Saturation: 95 %
PCO2 ART: 77.5 mmHg — AB (ref 35.0–45.0)
TCO2: 29 mmol/L (ref 0–100)
pH, Arterial: 7.139 — CL (ref 7.350–7.450)
pO2, Arterial: 102 mmHg — ABNORMAL HIGH (ref 80.0–100.0)

## 2015-03-27 LAB — GLUCOSE, CAPILLARY
GLUCOSE-CAPILLARY: 155 mg/dL — AB (ref 65–99)
GLUCOSE-CAPILLARY: 160 mg/dL — AB (ref 65–99)
GLUCOSE-CAPILLARY: 187 mg/dL — AB (ref 65–99)
Glucose-Capillary: 147 mg/dL — ABNORMAL HIGH (ref 65–99)
Glucose-Capillary: 151 mg/dL — ABNORMAL HIGH (ref 65–99)
Glucose-Capillary: 174 mg/dL — ABNORMAL HIGH (ref 65–99)
Glucose-Capillary: 165 mg/dL — ABNORMAL HIGH (ref 65–99)

## 2015-03-27 LAB — VANCOMYCIN, RANDOM: Vancomycin Rm: 39 ug/mL

## 2015-03-27 LAB — MAGNESIUM: Magnesium: 3.5 mg/dL — ABNORMAL HIGH (ref 1.7–2.4)

## 2015-03-27 LAB — PHOSPHORUS: PHOSPHORUS: 11.6 mg/dL — AB (ref 2.5–4.6)

## 2015-03-27 MED ORDER — DEXTROSE 5 % IV SOLN
INTRAVENOUS | Status: DC
  Administered 2015-03-27: 09:00:00 via INTRAVENOUS

## 2015-03-27 MED ORDER — FUROSEMIDE 10 MG/ML IJ SOLN
160.0000 mg | Freq: Four times a day (QID) | INTRAVENOUS | Status: DC
  Administered 2015-03-27 (×2): 160 mg via INTRAVENOUS
  Filled 2015-03-27 (×4): qty 16

## 2015-03-27 MED ORDER — METOLAZONE 10 MG PO TABS
10.0000 mg | ORAL_TABLET | Freq: Two times a day (BID) | ORAL | Status: DC
Start: 2015-03-27 — End: 2015-03-27
  Administered 2015-03-27: 10 mg via ORAL
  Filled 2015-03-27: qty 1

## 2015-03-27 MED ORDER — SODIUM POLYSTYRENE SULFONATE 15 GM/60ML PO SUSP
45.0000 g | Freq: Once | ORAL | Status: AC
  Administered 2015-03-27: 45 g
  Filled 2015-03-27: qty 180

## 2015-03-27 MED ORDER — DEXTROSE 5 % IV SOLN
1.0000 g | INTRAVENOUS | Status: AC
  Administered 2015-03-27 – 2015-03-31 (×5): 1 g via INTRAVENOUS
  Filled 2015-03-27 (×6): qty 1

## 2015-03-27 MED ORDER — SODIUM BICARBONATE 8.4 % IV SOLN
INTRAVENOUS | Status: DC
  Administered 2015-03-27 – 2015-03-28 (×2): via INTRAVENOUS
  Filled 2015-03-27 (×4): qty 150

## 2015-03-27 NOTE — Procedures (Signed)
Central Venous Catheter Insertion Procedure Note - temp hd cath Juan RipperRobert Cameron [id number] 31-May-1975  Procedure: Insertion of Central Venous Catheter Indications: hd  Procedure Details Consent: Risks of procedure as well as the alternatives and risks of each were explained to the (patient/caregiver).  Consent for procedure obtained. Time Out: Verified patient identification, verified procedure, site/side was marked, verified correct patient position, special equipment/implants available, medications/allergies/relevent history reviewed, required imaging and test results available.  Performed  Maximum sterile technique was used including antiseptics, cap, gloves, gown, hand hygiene, mask and sheet. Skin prep: Chlorhexidine; local anesthetic administered A antimicrobial bonded/coated triple lumen catheter was placed in the right internal jugular vein using the Seldinger technique.  Evaluation Blood flow good Complications: No apparent complications Patient did tolerate procedure well. Chest X-ray ordered to verify placement.  CXR: pending.  Nelda BucksFEINSTEIN,Ihsan Nomura J. 03/27/2015, 2:39 PM  US  Mcarthur Rossettianiel J. Tyson AliasFeinstein, MD, FACP Pgr: [phone] [facility]

## 2015-03-27 NOTE — Progress Notes (Signed)
Pharmacy Antibiotic Note  Juan RipperRobert Cameron is a 40 y.o. male admitted on 03/11/2015 with pneumonia/HCAP. SCr continues to increase rapidly. Not a HD candidate. Vanc random level is 39 this AM  Plan: - Hold vanc - consider repeat random Sat 3/18 - Decrease ceftazidime to 1 gm IV Q24H due to worsening renal fx - Continue eraxis 100mg  IV daily  Height: 5\' 9"  (175.3 cm) Weight: 231 lb 7.7 oz (105 kg) IBW/kg (Calculated) : 70.7  Temp (24hrs), Avg:99.1 F (37.3 C), Min:98.4 F (36.9 C), Max:100 F (37.8 C)   Recent Labs Lab 03/22/15 0400 03/23/15 0418 03/24/15 0345 03/25/15 0400 03/26/15 0715 03/27/15 0501  WBC 16.0* 19.5* 10.3 13.1* 19.6*  --   CREATININE 3.05* 3.51* 4.49* 5.46* 7.51* 8.88*  VANCORANDOM  --   --   --   --   --  39    Estimated Creatinine Clearance: 13.6 mL/min (by C-G formula based on Cr of 8.88).    Allergies  Allergen Reactions  . Peanuts [Peanut Oil] Anaphylaxis    Antimicrobials this admission: Cefepime 2/28 >> 3/7,  3/9 >> 3/11 Vanc 2/28 >> 3/4; 3/5 >> 3/11; 3/14>> Anidulifungin 3/14>> Ceftazadime 3/14>>  Microbiology results: 2/28 blood cx, urine cx, RVP, Flu PCR, strep UA, Legionella, MRSA PCR - negative 3/4 BCx x2 - NEG 3/4 resp cx: mod yeast 3/5 resp cx: mod yeast 3/9 resp cx: few yeast 3/10 Blood - NGTD 3/10 Urine - NEG  Isaac BlissMichael Lilliah Priego, PharmD, BCPS, [facility]es[facility]CCCP Clinical Pharmacist Pager [phone] 03/27/2015 8:27 AM

## 2015-03-27 NOTE — Consult Note (Signed)
[facility] Renal Consultation Note  Requesting MD: Titus Mould Indication for Consultation: AKI  HPI:  Juan Cameron is a 40 y.o. male with "low functioning autism" although was participating in a day program M-F and was interactive with family PTA,  and sickle cell trait.  He has had a terrible course which started out as bilateral PNA but now has progressed to ARDS and is on significant ventilator support - 70% FIO2 and high PEEP requiring paralysis.  He has good baseline renal function with a creatinine of 1- and did stay good throughout most of his previous hosp- then around 3/1 started to develop AKI- there was some low BP and had received vancomycin with a level on 39 recorded yesterday.  U/A on 2/28 was neg for protein and blood- He has been nonoliguric all along but with him being catabolic has had an ominous rise in BUN and creatinine at a level of 210 and 8.8 today.  We are asked to assess patient and determine if dialysis has a role in his care.     CREATININE, SER  Date/Time Value Ref Range Status  03/27/2015 05:01 AM 8.88* 0.61 - 1.24 mg/dL Final  03/26/2015 07:15 AM 7.51* 0.61 - 1.24 mg/dL Final  03/25/2015 04:00 AM 5.46* 0.61 - 1.24 mg/dL Final  03/24/2015 03:45 AM 4.49* 0.61 - 1.24 mg/dL Final  03/23/2015 04:18 AM 3.51* 0.61 - 1.24 mg/dL Final  03/22/2015 04:00 AM 3.05* 0.61 - 1.24 mg/dL Final  03/21/2015 05:15 AM 2.58* 0.61 - 1.24 mg/dL Final  03/20/2015 03:54 AM 2.76* 0.61 - 1.24 mg/dL Final  03/19/2015 04:20 AM 2.38* 0.61 - 1.24 mg/dL Final  03/18/2015 04:00 AM 1.83* 0.61 - 1.24 mg/dL Final  03/17/2015 04:21 AM 1.60* 0.61 - 1.24 mg/dL Final  03/16/2015 12:08 PM 1.28* 0.61 - 1.24 mg/dL Final  03/15/2015 04:55 AM 1.64* 0.61 - 1.24 mg/dL Final  03/14/2015 04:20 AM 1.36* 0.61 - 1.24 mg/dL Final  03/13/2015 04:17 AM 1.72* 0.61 - 1.24 mg/dL Final  03/12/2015 10:52 AM 1.27* 0.61 - 1.24 mg/dL Final  03/12/2015 04:34 AM 1.23 0.61 - 1.24 mg/dL Final  03/11/2015 06:30  AM 1.01 0.61 - 1.24 mg/dL Final  03/07/2015 07:38 AM 1.11 0.61 - 1.24 mg/dL Final  03/01/2015 03:28 AM 1.01 0.61 - 1.24 mg/dL Final  02/28/2015 07:08 PM 1.00 0.61 - 1.24 mg/dL Final  02/28/2015 11:00 AM 1.02 0.61 - 1.24 mg/dL Final     PMHx:   Past Medical History  Diagnosis Date  . Autism   . Sickle cell trait ([place])   . Pneumonia 02/2015  . Autism     Past Surgical History  Procedure Laterality Date  . No past surgeries      Family Hx:  Family History  Problem Relation Age of Onset  . Heart attack Mother     Social History:  reports that he has never smoked. He has never used smokeless tobacco. He reports that he does not drink alcohol or use illicit drugs.  Allergies:  Allergies  Allergen Reactions  . Peanuts [Peanut Oil] Anaphylaxis    Medications: Prior to Admission medications   Medication Sig Start Date End Date Taking? Authorizing Provider  albuterol (PROAIR HFA) 108 (90 Base) MCG/ACT inhaler Inhale 2 puffs into the lungs every 6 (six) hours as needed for wheezing or shortness of breath.  02/27/15  Yes Historical Provider, MD  albuterol (PROVENTIL) (2.5 MG/3ML) 0.083% nebulizer solution Take 2.5 mg by nebulization every 6 (six) hours as needed for wheezing or  shortness of breath.   Yes Historical Provider, MD  guaiFENesin (MUCINEX) 600 MG 12 hr tablet Take 600 mg by mouth 2 (two) times daily as needed for cough or to loosen phlegm.   Yes Historical Provider, MD  predniSONE (DELTASONE) 20 MG tablet Take 2 tablets (40 mg total) by mouth daily with breakfast. 40 mg for 3 days then 20 mg for 4 days then 10 mg for 2 days 03/10/15  Yes Costin Karlyne Greenspan, MD    I have reviewed the patient's current medications.  Labs:  Results for orders placed or performed during the hospital encounter of 03/11/15 (from the past 48 hour(s))  Glucose, capillary     Status: Abnormal   Collection Time: 03/25/15  7:52 PM  Result Value Ref Range   Glucose-Capillary 168 (H) 65 - 99 mg/dL   Glucose, capillary     Status: Abnormal   Collection Time: 03/25/15 11:16 PM  Result Value Ref Range   Glucose-Capillary 144 (H) 65 - 99 mg/dL  Blood gas, arterial     Status: Abnormal   Collection Time: 03/26/15  3:30 AM  Result Value Ref Range   FIO2 1.00    Delivery systems VENTILATOR    Mode PRESSURE REGULATED VOLUME CONTROL    VT 400 mL   LHR 35 resp/min   Peep/cpap 22.0 cm H20   pH, Arterial 7.163 (LL) 7.350 - 7.450    Comment: CRITICAL RESULT CALLED TO, READ BACK BY AND VERIFIED WITH:  MUNGUL, MD AT 1478 BY A BROOKER, RRT ON 03/26/2015    pCO2 arterial 75.6 (HH) 35.0 - 45.0 mmHg    Comment: CRITICAL RESULT CALLED TO, READ BACK BY AND VERIFIED WITH:  MUNGUL, MD AT 2956 BY A BROOKER, RRT ON 03/26/2015    pO2, Arterial 118 (H) 80.0 - 100.0 mmHg   Bicarbonate 26.0 (H) 20.0 - 24.0 mEq/L   TCO2 28.3 0 - 100 mmol/L   Acid-base deficit 1.7 0.0 - 2.0 mmol/L   O2 Saturation 97.1 %   Patient temperature 98.6    Collection site LEFT RADIAL    Drawn by [id number]    Sample type ARTERIAL DRAW    Allens test (pass/fail) PASS PASS  Glucose, capillary     Status: Abnormal   Collection Time: 03/26/15  3:56 AM  Result Value Ref Range   Glucose-Capillary 151 (H) 65 - 99 mg/dL  Comprehensive metabolic panel     Status: Abnormal   Collection Time: 03/26/15  7:15 AM  Result Value Ref Range   Sodium 144 135 - 145 mmol/L   Potassium 5.4 (H) 3.5 - 5.1 mmol/L   Chloride 98 (L) 101 - 111 mmol/L   CO2 24 22 - 32 mmol/L   Glucose, Bld 169 (H) 65 - 99 mg/dL   BUN 153 (H) 6 - 20 mg/dL   Creatinine, Ser 7.51 (H) 0.61 - 1.24 mg/dL   Calcium 8.0 (L) 8.9 - 10.3 mg/dL   Total Protein 6.4 (L) 6.5 - 8.1 g/dL   Albumin 2.2 (L) 3.5 - 5.0 g/dL   AST 54 (H) 15 - 41 U/L   ALT 35 17 - 63 U/L   Alkaline Phosphatase 110 38 - 126 U/L   Total Bilirubin 2.1 (H) 0.3 - 1.2 mg/dL   GFR calc non Af Amer 8 (L) >60 mL/min   GFR calc Af Amer 10 (L) >60 mL/min    Comment: (NOTE) The eGFR has been calculated using  the CKD EPI equation. This calculation has not been  validated in all clinical situations. eGFR's persistently <60 mL/min signify possible Chronic Kidney Disease.    Anion gap 22 (H) 5 - 15  CBC with Differential/Platelet     Status: Abnormal   Collection Time: 03/26/15  7:15 AM  Result Value Ref Range   WBC 19.6 (H) 4.0 - 10.5 K/uL   RBC 3.23 (L) 4.22 - 5.81 MIL/uL   Hemoglobin 8.8 (L) 13.0 - 17.0 g/dL   HCT 26.4 (L) 39.0 - 52.0 %   MCV 81.7 78.0 - 100.0 fL   MCH 27.2 26.0 - 34.0 pg   MCHC 33.3 30.0 - 36.0 g/dL   RDW 15.5 11.5 - 15.5 %   Platelets 188 150 - 400 K/uL   Neutrophils Relative % 89 %   Neutro Abs 17.4 (H) 1.7 - 7.7 K/uL   Lymphocytes Relative 4 %   Lymphs Abs 0.9 0.7 - 4.0 K/uL   Monocytes Relative 7 %   Monocytes Absolute 1.3 (H) 0.1 - 1.0 K/uL   Eosinophils Relative 0 %   Eosinophils Absolute 0.0 0.0 - 0.7 K/uL   Basophils Relative 0 %   Basophils Absolute 0.0 0.0 - 0.1 K/uL  Glucose, capillary     Status: Abnormal   Collection Time: 03/26/15  9:00 AM  Result Value Ref Range   Glucose-Capillary 155 (H) 65 - 99 mg/dL  Glucose, capillary     Status: Abnormal   Collection Time: 03/26/15 12:06 PM  Result Value Ref Range   Glucose-Capillary 171 (H) 65 - 99 mg/dL  Glucose, capillary     Status: Abnormal   Collection Time: 03/26/15  3:56 PM  Result Value Ref Range   Glucose-Capillary 160 (H) 65 - 99 mg/dL  Glucose, capillary     Status: Abnormal   Collection Time: 03/26/15  7:39 PM  Result Value Ref Range   Glucose-Capillary 220 (H) 65 - 99 mg/dL  Glucose, capillary     Status: Abnormal   Collection Time: 03/26/15 11:24 PM  Result Value Ref Range   Glucose-Capillary 155 (H) 65 - 99 mg/dL  Glucose, capillary     Status: Abnormal   Collection Time: 03/27/15  3:52 AM  Result Value Ref Range   Glucose-Capillary 160 (H) 65 - 99 mg/dL  Basic metabolic panel     Status: Abnormal   Collection Time: 03/27/15  5:01 AM  Result Value Ref Range   Sodium 148 (H) 135 -  145 mmol/L   Potassium 5.2 (H) 3.5 - 5.1 mmol/L   Chloride 98 (L) 101 - 111 mmol/L   CO2 24 22 - 32 mmol/L   Glucose, Bld 174 (H) 65 - 99 mg/dL   BUN 210 (H) 6 - 20 mg/dL   Creatinine, Ser 8.88 (H) 0.61 - 1.24 mg/dL   Calcium 8.0 (L) 8.9 - 10.3 mg/dL   GFR calc non Af Amer 7 (L) >60 mL/min   GFR calc Af Amer 8 (L) >60 mL/min    Comment: (NOTE) The eGFR has been calculated using the CKD EPI equation. This calculation has not been validated in all clinical situations. eGFR's persistently <60 mL/min signify possible Chronic Kidney Disease.    Anion gap 26 (H) 5 - 15  Phosphorus     Status: Abnormal   Collection Time: 03/27/15  5:01 AM  Result Value Ref Range   Phosphorus 11.6 (H) 2.5 - 4.6 mg/dL  Magnesium     Status: Abnormal   Collection Time: 03/27/15  5:01 AM  Result Value Ref Range  Magnesium 3.5 (H) 1.7 - 2.4 mg/dL  Vancomycin, random     Status: None   Collection Time: 03/27/15  5:01 AM  Result Value Ref Range   Vancomycin Rm 39 ug/mL    Comment:        Random Vancomycin therapeutic range is dependent on dosage and time of specimen collection. A peak range is 20.0-40.0 ug/mL A trough range is 5.0-15.0 ug/mL          Glucose, capillary     Status: Abnormal   Collection Time: 03/27/15  8:33 AM  Result Value Ref Range   Glucose-Capillary 151 (H) 65 - 99 mg/dL  I-STAT 3, arterial blood gas (G3+)     Status: Abnormal   Collection Time: 03/27/15  9:24 AM  Result Value Ref Range   pH, Arterial 7.139 (LL) 7.350 - 7.450   pCO2 arterial 77.5 (HH) 35.0 - 45.0 mmHg   pO2, Arterial 102.0 (H) 80.0 - 100.0 mmHg   Bicarbonate 26.2 (H) 20.0 - 24.0 mEq/L   TCO2 29 0 - 100 mmol/L   O2 Saturation 95.0 %   Acid-base deficit 3.0 (H) 0.0 - 2.0 mmol/L   Patient temperature 99.2 F    Collection site RADIAL, ALLEN'S TEST ACCEPTABLE    Drawn by Operator    Sample type ARTERIAL    Comment NOTIFIED PHYSICIAN   Glucose, capillary     Status: Abnormal   Collection Time: 03/27/15 12:16  PM  Result Value Ref Range   Glucose-Capillary 174 (H) 65 - 99 mg/dL     ROS:  Review of systems not obtained due to patient factors.  Physical Exam: Filed Vitals:   03/27/15 1200 03/27/15 1221  BP: 120/74   Pulse: 120   Temp:  98.8 F (37.1 C)  Resp: 35      General:sedated, paralyzed BM- on vent HEENT: PERRLA Neck: difficult to tell JVD due to body habitus Heart: tachy Lungs: CBS bilat Abdomen: obese, soft, non tender Extremities: only trace peripheral edema Skin: warm and dry Neuro:paralyzed   Assessment/Plan:40 year old BM with autism lately with a prolonged hosp for PNA now ARDS requiring prolonged vent support- now has developed AKI 1.Renal- AKI in the setting of ICU stay, hypotension/ARDS as well as supratherapeutic vanc level.  Will check U/A.  The good news is that he is nonoligurc and had baseline normal kidney function.  That along with him being so young makes me feel that he has a reasonable chance for renal recovery.  With that in mind I think it would be reasonable to do short term dialysis for clearance in an effort to give him time to recover renal function.  I do not feel that patient is an appropriate candidate for chronic or long term dialysis- would take his quality of life down too far in my opinion - have discussed that with the family and they are in agreement.  We have talked about a 10 day trial of dialysis support and see where we are at that time. As he is hemodynamically stable/nonoliguric and volume is not a significant issue - I will see if he will tolerate IHD first and only if he fails proceed to CRRT 2. Hypertension/volume  - is nonoliguric and volume seems reasonable- does not need volume removal- OK to  continue diuretics 3. Pulm  - obviously the bigger issue- per CCM 4. Anemia- situational- supportive care    Waylyn Tenbrink A 03/27/2015, 1:28 PM

## 2015-03-27 NOTE — Progress Notes (Signed)
Patient trach sutures removed, with assistance of RN, without complications.

## 2015-03-27 NOTE — Progress Notes (Addendum)
PULMONARY / CRITICAL CARE MEDICINE   Name: Juan Cameron MRN: [id number] DOB: 21-Aug-1975    ADMISSION DATE:  03/11/2015  CONSULTATION DATE: 2/28  REFERRING MD: [facility]  CHIEF COMPLAINT: resp failure  HISTORY OF PRESENT ILLNESS:  5 autistic male recently in hospital for multilobar pneumonia and dc'd on 6 l [place] and returned in 24 hours with sats in 50%. He was discharged on 03/10/15 after treatment for CAP, NOI, and was on 6 l [place]. [name]) checked sats and he was 50% and therefore was transported [facility] to [facility] and required 80% fio2 on NIVMS with RR 45. CXR consistent early ARDS, 2d echo with nl EF but grade 2 dysfunction. PCCM called to bedside on 3/1 with anticipation of requiring to be intubated.  SUBJECTIVE:  POS 6.8 liters   VITAL SIGNS: BP 120/69 mmHg  Pulse 120  Temp(Src) 98.7 F (37.1 C) (Oral)  Resp 35  Ht  (1.753 m)  Wt 105 kg (231 lb 7.7 oz)  BMI 34.17 kg/m2  SpO2 98%  HEMODYNAMICS:    VENTILATOR SETTINGS: Vent Mode:  [-] PRVC FiO2 (%):  [70 %] 70 % Set Rate:  [35 bmp] 35 bmp Vt Set:  [400 mL] 400 mL PEEP:  [18 cmH20-22 cmH20] 18 cmH20 Plateau Pressure:  [39 cmH20-43 cmH20] 40 cmH20  INTAKE / OUTPUT: I/O last 3 completed shifts: In: 9965.3 [I.V.:6767.3; Other:280; NG/GT:2670; IV Piggyback:248] Out: 1770 [Urine:1720; Stool:50]  PHYSICAL EXAMINATION: General: Sedated and paralyzed Neuro: Sedated and paralyzed, perr HEENT: [place]/AT, trach wnl Cardiovascular: RRT s1 s2  Lungs: ronchi diffuse no changes Abdomen: NT, ND, hypoactive bowel sounds. Musculoskeletal: edema increasing Skin: Warm, moist  LABS:  BMET  Recent Labs Lab 03/25/15 0400 03/26/15 0715 03/27/15 0501  NA 145 144 148*  K 3.6 5.4* 5.2*  CL 97* 98* 98*  CO2 BUN 120* 153* 210*  CREATININE 5.46* 7.51* 8.88*  GLUCOSE 131* 169* 174*   Electrolytes  Recent Labs Lab 03/23/15 0418 03/24/15 0345 03/25/15 0400 03/26/15 0715 03/27/15 0501  CALCIUM 8.7* 8.9  9.0 8.0* 8.0*  MG 2.4 2.5*  --   --  3.5*  PHOS 8.3* 7.1*  --   --  11.6*   CBC  Recent Labs Lab 03/24/15 0345 03/25/15 0400 03/26/15 0715  WBC 10.3 13.1* 19.6*  HGB 9.8* 8.9* 8.8*  HCT 31.2* 27.6* 26.4*  PLT 105* 158 188   Coag's No results for input(s): APTT, INR in the last 168 hours. Sepsis Markers No results for input(s): LATICACIDVEN, PROCALCITON, O2SATVEN in the last 168 hours. ABG  Recent Labs Lab 03/25/15 0525 03/25/15 0856 03/26/15 0330  PHART 7.299* 7.214* 7.163*  PCO2ART 66.9* 80.0* 75.6*  PO2ART 67.0* 66.0* 118*   Liver Enzymes  Recent Labs Lab 03/25/15 0400 03/26/15 0715  AST 36 54*  ALT 35 35  ALKPHOS 86 110  BILITOT 1.2 2.1*  ALBUMIN 2.4* 2.2*    Cardiac Enzymes  Recent Labs Lab 03/22/15 1725 03/22/15 2304 03/23/15 0418  TROPONINI 0.08* 0.11* 0.07*   Glucose  Recent Labs Lab 03/26/15 0900 03/26/15 1206 03/26/15 1556 03/26/15 1939 03/26/15 2324 03/27/15 0352  GLUCAP 155* 171* 160* 220* 155* 160*   Imaging Dg Chest Port 1 View  03/27/2015  CLINICAL DATA:  Adult respiratory distress syndrome. EXAM: PORTABLE CHEST 1 VIEW COMPARISON:  March 26, 2015. FINDINGS: Stable cardiomediastinal silhouette. Stable position of tracheostomy and nasogastric tubes. No pneumothorax is noted. Stable persistent density seen in right lung apex concerning for inflammation. Stable  bibasilar opacities are noted concerning for atelectasis or possibly edema. Bony thorax is unremarkable. IMPRESSION: Stable support apparatus. Stable right apical density concerning for infiltrate. Stable bibasilar interstitial densities concerning for atelectasis or edema. Electronically Signed   By: Lupita RaiderJames  Green Jr, M.D.   On: 03/27/2015 07:52   CULTURES: 2/28 bc x 2 negative 2/28 uc negative 2/28 RVP negative Sputum culture 3/4, 3/5, 3/9 >>yeast Blood culture 3/4 >>  ANTIBIOTICS: 2/28 vanc>>3/10 2/28 cefepime>>3/10 3/14 ceftaz>>> 3/14 vanc>>> 3/14  anidulo>>>  SIGNIFICANT EVENTS: 2/28 readmit to hospital  03/12/15 Intubated 3/1 Bronchoscopy  3/14- unable to ventilate or oxygenate< DNR 3/15- pos 6.8 liters  LINES/TUBES: Central line L IJ TLC>3/1 ETT out Trach 3/9>>>   ASSESSMENT / PLAN:  PULMONARY A: Acute hypoxic resp failure. Suspected recurrent pna/HCAP/ asp pna.  Worsening ards, refractory R/o VAP R/o fungal component Suspected undiagnosed OSA pulm edema component rul ATX P:  6 cc/kg, lungs remain stiff, peep 18, 60% maxed MV, obtain ABg this am , if able would reduce to TV to 5 cc/kg Goal to peep 15 as to 60% pcxr in am  Neg balance as able, add xaraxolin and lasix to q6h Chest pt rul  CARDIOVASCULAR A:  Septic Shock -- resolved. P:  Tele Pressors would be futile  RENAL Lab Results  Component Value Date   CREATININE 8.88* 03/27/2015   CREATININE 7.51* 03/26/2015   CREATININE 5.46* 03/25/2015  A:  Acute Kidney Injury, ARF P:  Lasix high dose to neg balance, add xara, increase lasix to q6h Add d5w add to 60 cc/hr  Consider confirm with renal candidacy given some improved vent, although I feel strongly he will not survive regardeless Chem, mg, phos in am  kayx  Repeat   GASTROINTESTINAL A:  Obtained BM P:  Cont TF NGT. mir, colace Lactulose  Dc   HEMATOLOGIC A:  Hx of sickle cell trait VTE  P:  Continue SQ heparin.  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 Cultures (-) so far.  At risk VAP now P:  Continued current coverage Some improved vent, keep antifungal coverage Consider dc vanc (renal failure, empiric, culture neg)  ENDOCRINE A:  hyperglycemia  P:  ISS ordered moderate.controlled  NEUROLOGIC A:  Mentally impaired with limited vocabulary of 500 words.  Autism  Decreased LOC from metabolic disarray. P:  RASS goal: -5 Versed, fentanyl and nimbex unable to stop severe debiliation  Ccm time  35 min  Sister updated   Juan RossettiDaniel J. Tyson AliasFeinstein, MD, FACP Pgr: [phone] [facility]

## 2015-03-28 ENCOUNTER — Inpatient Hospital Stay (HOSPITAL_COMMUNITY): Payer: Medicare Other

## 2015-03-28 DIAGNOSIS — Z992 Dependence on renal dialysis: Secondary | ICD-10-CM

## 2015-03-28 DIAGNOSIS — N186 End stage renal disease: Secondary | ICD-10-CM

## 2015-03-28 DIAGNOSIS — Z9911 Dependence on respirator [ventilator] status: Secondary | ICD-10-CM

## 2015-03-28 DIAGNOSIS — B379 Candidiasis, unspecified: Secondary | ICD-10-CM

## 2015-03-28 LAB — COMPREHENSIVE METABOLIC PANEL
ALT: 38 U/L (ref 17–63)
ANION GAP: 20 — AB (ref 5–15)
AST: 45 U/L — AB (ref 15–41)
Albumin: 2.3 g/dL — ABNORMAL LOW (ref 3.5–5.0)
Alkaline Phosphatase: 104 U/L (ref 38–126)
BILIRUBIN TOTAL: 0.7 mg/dL (ref 0.3–1.2)
BUN: 165 mg/dL — AB (ref 6–20)
CHLORIDE: 98 mmol/L — AB (ref 101–111)
CO2: 25 mmol/L (ref 22–32)
Calcium: 8 mg/dL — ABNORMAL LOW (ref 8.9–10.3)
Creatinine, Ser: 7.11 mg/dL — ABNORMAL HIGH (ref 0.61–1.24)
GFR calc Af Amer: 10 mL/min — ABNORMAL LOW (ref >60)
GFR calc non Af Amer: 9 mL/min — ABNORMAL LOW (ref >60)
GLUCOSE: 200 mg/dL — AB (ref 65–99)
POTASSIUM: 4.6 mmol/L (ref 3.5–5.1)
Sodium: 143 mmol/L (ref 135–145)
TOTAL PROTEIN: 6.5 g/dL (ref 6.5–8.1)

## 2015-03-28 LAB — CBC WITH DIFFERENTIAL/PLATELET
BASOS ABS: 0 10*3/uL (ref 0.0–0.1)
Basophils Relative: 0 %
EOS PCT: 0 %
Eosinophils Absolute: 0 10*3/uL (ref 0.0–0.7)
HEMATOCRIT: 22.1 % — AB (ref 39.0–52.0)
Hemoglobin: 7.2 g/dL — ABNORMAL LOW (ref 13.0–17.0)
LYMPHS ABS: 1.9 10*3/uL (ref 0.7–4.0)
LYMPHS PCT: 11 %
MCH: 26.2 pg (ref 26.0–34.0)
MCHC: 32.6 g/dL (ref 30.0–36.0)
MCV: 80.4 fL (ref 78.0–100.0)
MONO ABS: 0.6 10*3/uL (ref 0.1–1.0)
MONOS PCT: 3 %
NEUTROS ABS: 15.7 10*3/uL — AB (ref 1.7–7.7)
Neutrophils Relative %: 86 %
PLATELETS: 153 10*3/uL (ref 150–400)
RBC: 2.75 MIL/uL — ABNORMAL LOW (ref 4.22–5.81)
RDW: 15.4 % (ref 11.5–15.5)
WBC: 18.2 10*3/uL — ABNORMAL HIGH (ref 4.0–10.5)

## 2015-03-28 LAB — GLUCOSE, CAPILLARY
GLUCOSE-CAPILLARY: 114 mg/dL — AB (ref 65–99)
GLUCOSE-CAPILLARY: 147 mg/dL — AB (ref 65–99)
GLUCOSE-CAPILLARY: 149 mg/dL — AB (ref 65–99)
GLUCOSE-CAPILLARY: 177 mg/dL — AB (ref 65–99)
Glucose-Capillary: 118 mg/dL — ABNORMAL HIGH (ref 65–99)
Glucose-Capillary: 154 mg/dL — ABNORMAL HIGH (ref 65–99)

## 2015-03-28 LAB — POCT I-STAT 3, ART BLOOD GAS (G3+)
Bicarbonate: 27.3 mEq/L — ABNORMAL HIGH (ref 20.0–24.0)
O2 SAT: 94 %
PCO2 ART: 64.9 mmHg — AB (ref 35.0–45.0)
PH ART: 7.236 — AB (ref 7.350–7.450)
Patient temperature: 99.8
TCO2: 29 mmol/L (ref 0–100)
pO2, Arterial: 89 mmHg (ref 80.0–100.0)

## 2015-03-28 LAB — TRIGLYCERIDES: Triglycerides: 418 mg/dL — ABNORMAL HIGH (ref <150)

## 2015-03-28 MED ORDER — HEPARIN SODIUM (PORCINE) 1000 UNIT/ML DIALYSIS
20.0000 [IU]/kg | INTRAMUSCULAR | Status: DC | PRN
  Filled 2015-03-28: qty 3

## 2015-03-28 MED ORDER — LIDOCAINE-PRILOCAINE 2.5-2.5 % EX CREA
1.0000 "application " | TOPICAL_CREAM | CUTANEOUS | Status: DC | PRN
  Filled 2015-03-28: qty 5

## 2015-03-28 MED ORDER — LIDOCAINE HCL (PF) 1 % IJ SOLN: 5.0000 mL | INTRAMUSCULAR | Status: DC | PRN

## 2015-03-28 MED ORDER — HEPARIN SODIUM (PORCINE) 1000 UNIT/ML DIALYSIS: 1000.0000 [IU] | INTRAMUSCULAR | Status: DC | PRN

## 2015-03-28 MED ORDER — VECURONIUM BROMIDE 10 MG IV SOLR: 10.0000 mg | INTRAVENOUS | Status: DC | PRN

## 2015-03-28 MED ORDER — SODIUM CHLORIDE 0.9 % IV SOLN: 100.0000 mL | INTRAVENOUS | Status: DC | PRN

## 2015-03-28 MED ORDER — ALTEPLASE 2 MG IJ SOLR: 2.0000 mg | Freq: Once | INTRAMUSCULAR | Status: DC | PRN

## 2015-03-28 MED ORDER — PENTAFLUOROPROP-TETRAFLUOROETH EX AERO: 1.0000 "application " | INHALATION_SPRAY | CUTANEOUS | Status: DC | PRN

## 2015-03-28 MED ORDER — HEPARIN SODIUM (PORCINE) 1000 UNIT/ML DIALYSIS
1000.0000 [IU] | INTRAMUSCULAR | Status: DC | PRN
  Administered 2015-03-28: 1000 [IU] via INTRAVENOUS_CENTRAL

## 2015-03-28 MED ORDER — LIDOCAINE-PRILOCAINE 2.5-2.5 % EX CREA: 1.0000 "application " | TOPICAL_CREAM | CUTANEOUS | Status: DC | PRN

## 2015-03-28 MED ORDER — ALTEPLASE 2 MG IJ SOLR
2.0000 mg | Freq: Once | INTRAMUSCULAR | Status: DC | PRN
  Filled 2015-03-28: qty 2

## 2015-03-28 NOTE — Progress Notes (Signed)
    [facility]   Reason for visit: Follow up on VDRF, septic shock  Interval History: now on paralytics, on ceftazidime and anidalufungin for Candida in trach.     Physical Exam: Constitutional:  Filed Vitals:   03/28/15 0941 03/28/15 1000  BP:  124/67  Pulse:  121  Temp: 100.1 F (37.8 C)   Resp:  35   intubated, sedated Respiratory: CTA B, anterior exam Cardiovascular: Tachy RR  Review of Systems: Unable to be assessed due to mental status  Lab Results  Component Value Date   WBC 18.2* 03/28/2015   HGB 7.2* 03/28/2015   HCT 22.1* 03/28/2015   MCV 80.4 03/28/2015   PLT 153 03/28/2015    Lab Results  Component Value Date   CREATININE 7.11* 03/28/2015   BUN 165* 03/28/2015   NA 143 03/28/2015   K 4.6 03/28/2015   CL 98* 03/28/2015   CO2 25 03/28/2015    Lab Results  Component Value Date   ALT 38 03/28/2015   AST 45* 03/28/2015   ALKPHOS 104 03/28/2015     Microbiology: Recent Results (from the past 240 hour(s))  Culture, respiratory (NON-Expectorated)     Status: None   Collection Time: 03/20/15  3:08 PM  Result Value Ref Range Status   Specimen Description BRONCHIAL ALVEOLAR LAVAGE  Final   Special Requests NONE  Final   Gram Stain   Final    MODERATE WBC PRESENT, PREDOMINANTLY PMN NO SQUAMOUS EPITHELIAL CELLS SEEN NO ORGANISMS SEEN Performed at [facility]    Culture   Final    FEW YEAST CONSISTENT WITH CANDIDA SPECIES Performed at [facility]    Report Status 03/23/2015 FINAL  Final  Culture, Urine     Status: None   Collection Time: 03/21/15  3:19 PM  Result Value Ref Range Status   Specimen Description URINE, RANDOM  Final   Special Requests NONE  Final   Culture NO GROWTH 1 DAY  Final   Report Status 03/22/2015 FINAL  Final  Culture, blood (routine x 2)     Status: None   Collection Time: 03/21/15  6:19 PM  Result Value Ref Range Status   Specimen Description BLOOD RIGHT ANTECUBITAL  Final   Special Requests BOTTLES DRAWN AEROBIC AND ANAEROBIC 10CC  Final   Culture NO GROWTH 5 DAYS  Final   Report Status 03/26/2015 FINAL  Final  Culture, blood (routine x 2)     Status: None   Collection Time: 03/21/15  6:33 PM  Result Value Ref Range Status   Specimen Description BLOOD RIGHT HAND  Final   Special Requests BOTTLES DRAWN AEROBIC ONLY 10CC  Final   Culture NO GROWTH 5 DAYS  Final   Report Status 03/26/2015 FINAL  Final    Impression/Plan:  1. Respiratory failure - on ceftazidime for 7 days.  I don;t think Candida is significant so will stop the anidulafungin.   2.  Renal failure - on HD.   Poor prognosis, will sign off, please call with any changes. thanks

## 2015-03-28 NOTE — Progress Notes (Signed)
PULMONARY / CRITICAL CARE MEDICINE   Name: Juan Cameron MRN: [id number] DOB: 12/03/1975    ADMISSION DATE:  03/11/2015  CONSULTATION DATE: 2/28  REFERRING MD: Triad  CHIEF COMPLAINT: resp failure  HISTORY OF PRESENT ILLNESS:  89 autistic male recently in hospital for multilobar pneumonia and dc'd on 6 l [place] and returned in 24 hours with sats in 50%. He was discharged on 03/10/15 after treatment for CAP, NOI, and was on 6 l [place]. Sister [facility]) checked sats and he was 50% and therefore was transported back to [facility] and required 80% fio2 on NIVMS with RR 45. CXR consistent early ARDS, 2d echo with nl EF but grade 2 dysfunction. PCCM called to bedside on 3/1 with anticipation of requiring to be intubated.  SUBJECTIVE:  HD started 3/16 O2 needs better overall  VITAL SIGNS: BP 115/64 mmHg  Pulse 121  Temp(Src) 99.3 F (37.4 C) (Oral)  Resp 35  Ht  (1.753 m)  Wt 109 kg (240 lb 4.8 oz)  BMI 35.47 kg/m2  SpO2 94%  HEMODYNAMICS:    VENTILATOR SETTINGS: Vent Mode:  [-] PRVC FiO2 (%):  [55 %] 55 % Set Rate:  [35 bmp] 35 bmp Vt Set:  [400 mL] 400 mL PEEP:  [15 cmH20] 15 cmH20 Plateau Pressure:  [34 cmH20-41 cmH20] 37 cmH20  INTAKE / OUTPUT: I/O last 3 completed shifts: In: 7672.1 [I.V.:4804.1; Other:100; NG/GT:2260; IV Piggyback:508] Out: 1310 [Urine:1310]  PHYSICAL EXAMINATION: General: Sedated and paralyzed Neuro: Sedated and paralyzed, perrl HEENT: trach clean Cardiovascular: RRT s1 s2  Lungs: coarse Abdomen: NT, ND, hypoactive bowel sounds. Musculoskeletal: edema increasing Skin: Warm, moist  LABS:  BMET  Recent Labs Lab 03/26/15 0715 03/27/15 0501 03/28/15 0400  NA 144 148* 143  K 5.4* 5.2* 4.6  CL 98* 98* 98*  CO2 BUN 153* 210* 165*  CREATININE 7.51* 8.88* 7.11*  GLUCOSE 169* 174* 200*   Electrolytes  Recent Labs Lab 03/23/15 0418 03/24/15 0345  03/26/15 0715 03/27/15 0501 03/28/15 0400  CALCIUM 8.7* 8.9  < > 8.0* 8.0*  8.0*  MG 2.4 2.5*  --   --  3.5*  --   PHOS 8.3* 7.1*  --   --  11.6*  --   < > = values in this interval not displayed. CBC  Recent Labs Lab 03/25/15 0400 03/26/15 0715 03/28/15 0400  WBC 13.1* 19.6* 18.2*  HGB 8.9* 8.8* 7.2*  HCT 27.6* 26.4* 22.1*  PLT 158 188 153   Coag's No results for input(s): APTT, INR in the last 168 hours. Sepsis Markers No results for input(s): LATICACIDVEN, PROCALCITON, O2SATVEN in the last 168 hours. ABG  Recent Labs Lab 03/26/15 0330 03/27/15 0924 03/28/15 0326  PHART 7.163* 7.139* 7.236*  PCO2ART 75.6* 77.5* 64.9*  PO2ART 118* 102.0* 89.0   Liver Enzymes  Recent Labs Lab 03/25/15 0400 03/26/15 0715 03/28/15 0400  AST 36 54* 45*  ALT 35 35 38  ALKPHOS 86 110 104  BILITOT 1.2 2.1* 0.7  ALBUMIN 2.4* 2.2* 2.3*    Cardiac Enzymes  Recent Labs Lab 03/22/15 1725 03/22/15 2304 03/23/15 0418  TROPONINI 0.08* 0.11* 0.07*   Glucose  Recent Labs Lab 03/27/15 0833 03/27/15 1216 03/27/15 1556 03/27/15 1954 03/28/15 0006 03/28/15 0332  GLUCAP 151* 174* 187* 165* 147* 177*   Imaging Dg Chest Port 1 View  03/28/2015  CLINICAL DATA:  Pneumonia. EXAM: PORTABLE CHEST 1 VIEW COMPARISON:  03/27/2015 FINDINGS: Tracheostomy tube overlies the airway. Right jugular central venous  catheter terminates over the mid to lower SVC. Left jugular approach catheter terminates in the region of the lower jugular vein, unchanged. Enteric tube courses into the left upper abdomen with tip not imaged. Cardiomediastinal silhouette is unchanged allowing for leftward patient rotation on the current examination. Bilateral airspace opacities are greatest in the lung bases and have not significantly changed. No sizable pleural effusion or pneumothorax is identified. IMPRESSION: 1. Unchanged support devices as above. 2. Unchanged airspace opacities which may reflect pulmonary edema or pneumonia. Electronically Signed   By: Sebastian AcheAllen  Grady M.D.   On: 03/28/2015 08:06    Dg Chest Port 1 View  03/27/2015  CLINICAL DATA:  Central line placement EXAM: PORTABLE CHEST 1 VIEW COMPARISON:  03/27/2015 FINDINGS: Tracheostomy remains in good position. Left jugular central venous catheter unchanged in the left jugular vein above the innominate vein Interval placement of right jugular dual-lumen catheter with the tip in the lower SVC. No pneumothorax Progression of bilateral airspace disease which may represent pulmonary edema versus pneumonia. Bibasilar atelectasis. No significant effusion IMPRESSION: Right jugular dual-lumen catheter in the SVC without pneumothorax. Progression of bilateral airspace disease suggesting pulmonary edema. Electronically Signed   By: Marlan Palauharles  Clark M.D.   On: 03/27/2015 14:58   CULTURES: 2/28 bc x 2 negative 2/28 uc negative 2/28 RVP negative Sputum culture 3/4, 3/5, 3/9 >>yeast Blood culture 3/4 >>  ANTIBIOTICS: 2/28 vanc>>3/10 2/28 cefepime>>3/10 3/14 ceftaz>>>plan stop 3/21 3/14 vanc>>>3/16 3/14 anidulo>>>plan stop 21  SIGNIFICANT EVENTS: 2/28 readmit to hospital  03/12/15 Intubated 3/1 Bronchoscopy  3/14- unable to ventilate or oxygenate< DNR 3/15- pos 6.8 liters 3/16- HD started  LINES/TUBES: Central line L IJ TLC>3/1 ETT out Trach 3/9>>> Rt ij hd 3/16>>>  ASSESSMENT / PLAN:  PULMONARY A: Acute hypoxic resp failure. Suspected recurrent pna/HCAP/ asp pna.  Worsening ards, refractory R/o VAP R/o fungal component Suspected undiagnosed OSA pulm edema component rul ATX P:  6 cc/kg, lungs remain stiff Goal plat less 30 With pressures, would reduce peep 12 and if needed to 60% okay abg reviewed, permissive hypercapnia Neg balance needed on HD, consider daily HD? abg in am  Dc paralysis Dc mucomysts Dc chest pt  CARDIOVASCULAR A:  Septic Shock -- resolved. P:  Tele Pressors would be futile  RENAL Lab Results  Component Value Date   CREATININE 7.11* 03/28/2015   CREATININE 8.88* 03/27/2015    CREATININE 7.51* 03/26/2015  A:  Acute Kidney Injury, ARF P:  HD , consider daily  Dc free water  GASTROINTESTINAL A:  Obtained BM P:  Cont TF mir, colace  HEMATOLOGIC A:  Hx of sickle cell trait VTE  P:  Continue SQ heparin.  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 Cultures (-) so far.  At risk VAP now P:  Continued current coverage- ad stop date Some improved vent, keep antifungal coverage- add stop date 7 days  ENDOCRINE A:  hyperglycemia  P:  ISS ordered moderate.controlled  NEUROLOGIC A:  Mentally impaired with limited vocabulary of 500 words.  Autism  Decreased LOC from metabolic disarray. P:  RASS goal: -3 Versed, fentanyl Dc nimbex, if needed ass int paralysis vec q2h prn severe debiliation  Ccm time 35 min  Sister updated daily  Mcarthur RossettiDaniel J. Tyson AliasFeinstein, MD, FACP Pgr: [phone]  Pulmonary & Critical Care

## 2015-03-28 NOTE — Progress Notes (Signed)
Admit: 03/11/2015 LOS: 17  10M AKI (nl BL SCr), ARDS and prolonged VDRF from PNA; Septic Shock  Subjective:  HD #1 yesterday, R IJ Temp Cath Tolerated well No UF  Family / Sister at bedside, updated, confirmed plan for trial of short term HD (10d)  03/16 0701 - 03/17 0700 In: 4957.7 [I.V.:2945.7; NG/GT:1570; IV Piggyback:442] Out: 795 [Urine:795]  Filed Weights   03/27/15 0500 03/27/15 2248 03/28/15 0125  Weight: 105 kg (231 lb 7.7 oz) 109 kg (240 lb 4.8 oz) 109 kg (240 lb 4.8 oz)    Scheduled Meds: . anidulafungin  100 mg Intravenous Q24H  . antiseptic oral rinse  7 mL Mouth Rinse QID  . artificial tears  1 application Both Eyes 3 times per day  . cefTAZidime (FORTAZ)  IV  1 g Intravenous Q24H  . chlorhexidine gluconate  15 mL Mouth Rinse BID  . docusate  100 mg Per Tube Daily  . feeding supplement (PRO-STAT SUGAR FREE 64)  30 mL Per Tube BID  . feeding supplement (VITAL HIGH PROTEIN)  1,000 mL Per Tube Q24H  . heparin subcutaneous  5,000 Units Subcutaneous 3 times per day  . insulin aspart  2-6 Units Subcutaneous 6 times per day  . ipratropium-albuterol  3 mL Nebulization Q4H  . methylPREDNISolone (SOLU-MEDROL) injection  80 mg Intravenous 3 times per day  . pantoprazole sodium  40 mg Per Tube Q1200  . polyethylene glycol  17 g Oral Daily  . sodium chloride flush  3 mL Intravenous Q12H   Continuous Infusions: . fentaNYL infusion INTRAVENOUS 300 mcg/hr (03/28/15 0242)  . midazolam (VERSED) infusion 9 mg/hr (03/28/15 0242)  . norepinephrine (LEVOPHED) Adult infusion Stopped (03/23/15 0500)   PRN Meds:.acetaminophen (TYLENOL) oral liquid 160 mg/5 mL **OR** acetaminophen, fentaNYL, midazolam, ondansetron **OR** ondansetron (ZOFRAN) IV, vecuronium, white petrolatum  Current Labs: reviewed   Physical Exam:  Blood pressure 115/64, pulse 121, temperature 99.3 F (37.4 C), temperature source Oral, resp. rate 35, height 5\' 9"  (1.753 m), weight 109 kg (240 lb 4.8 oz), SpO2 94  %. Intubated, paralyzed, sedated ETT in place Tachy, regular Coarse bs b/l R IJ HD cath in place  A 1. Nonoliguric AKI with marked azotemia 1. Normal baseline SCr 2. Started HD 3/16 with R IJ temp cath as short term approach 2. ARDS / Trach / Prolonged VDRF 2/2 PNA 3. Resolved septic shock 4. Autism  P 1. Cont daily HD, attempt some UF today 2. Family and I confirmed and agreed upon short term attempt, optimism for renal recovery but that long term HD would not be desired / indicated  Juan Heckyan Pantera Winterrowd MD 03/28/2015, 8:45 AM   Recent Labs Lab 03/23/15 0418 03/24/15 0345  03/26/15 0715 03/27/15 0501 03/28/15 0400  NA 144 144  < > 144 148* 143  K 3.5 4.0  < > 5.4* 5.2* 4.6  CL 99* 98*  < > 98* 98* 98*  CO2 28 29  < > 24 24 25   GLUCOSE 149* 120*  < > 169* 174* 200*  BUN 74* 90*  < > 153* 210* 165*  CREATININE 3.51* 4.49*  < > 7.51* 8.88* 7.11*  CALCIUM 8.7* 8.9  < > 8.0* 8.0* 8.0*  PHOS 8.3* 7.1*  --   --  11.6*  --   < > = values in this interval not displayed.  Recent Labs Lab 03/25/15 0400 03/26/15 0715 03/28/15 0400  WBC 13.1* 19.6* 18.2*  NEUTROABS 9.9* 17.4* 15.7*  HGB 8.9* 8.8* 7.2*  HCT 27.6* 26.4* 22.1*  MCV 80.9 81.7 80.4  PLT 158 188 153

## 2015-03-28 NOTE — Progress Notes (Addendum)
[place] fever of 101.6. Sister at bedside and notified as well.

## 2015-03-29 ENCOUNTER — Inpatient Hospital Stay (HOSPITAL_COMMUNITY): Payer: Medicare Other

## 2015-03-29 LAB — HEPATITIS B SURFACE ANTIBODY,QUALITATIVE: Hep B S Ab: NONREACTIVE

## 2015-03-29 LAB — CBC WITH DIFFERENTIAL/PLATELET
BASOS ABS: 0 10*3/uL (ref 0.0–0.1)
Basophils Relative: 0 %
Eosinophils Absolute: 0 10*3/uL (ref 0.0–0.7)
Eosinophils Relative: 0 %
HCT: 22 % — ABNORMAL LOW (ref 39.0–52.0)
HEMOGLOBIN: 7.4 g/dL — AB (ref 13.0–17.0)
LYMPHS PCT: 8 %
Lymphs Abs: 1.6 10*3/uL (ref 0.7–4.0)
MCH: 27 pg (ref 26.0–34.0)
MCHC: 33.6 g/dL (ref 30.0–36.0)
MCV: 80.3 fL (ref 78.0–100.0)
MONOS PCT: 8 %
Monocytes Absolute: 1.6 10*3/uL — ABNORMAL HIGH (ref 0.1–1.0)
Neutro Abs: 16.2 10*3/uL — ABNORMAL HIGH (ref 1.7–7.7)
Neutrophils Relative %: 84 %
Platelets: 177 10*3/uL (ref 150–400)
RBC: 2.74 MIL/uL — AB (ref 4.22–5.81)
RDW: 15.3 % (ref 11.5–15.5)
WBC: 19.4 10*3/uL — AB (ref 4.0–10.5)

## 2015-03-29 LAB — COMPREHENSIVE METABOLIC PANEL
ALT: 53 U/L (ref 17–63)
ANION GAP: 19 — AB (ref 5–15)
AST: 193 U/L — ABNORMAL HIGH (ref 15–41)
Albumin: 2.3 g/dL — ABNORMAL LOW (ref 3.5–5.0)
Alkaline Phosphatase: 89 U/L (ref 38–126)
BUN: 167 mg/dL — ABNORMAL HIGH (ref 6–20)
CHLORIDE: 96 mmol/L — AB (ref 101–111)
CO2: 25 mmol/L (ref 22–32)
CREATININE: 7.02 mg/dL — AB (ref 0.61–1.24)
Calcium: 8.1 mg/dL — ABNORMAL LOW (ref 8.9–10.3)
GFR, EST AFRICAN AMERICAN: 10 mL/min — AB (ref >60)
GFR, EST NON AFRICAN AMERICAN: 9 mL/min — AB (ref >60)
Glucose, Bld: 180 mg/dL — ABNORMAL HIGH (ref 65–99)
POTASSIUM: 5.4 mmol/L — AB (ref 3.5–5.1)
Sodium: 140 mmol/L (ref 135–145)
Total Bilirubin: 0.8 mg/dL (ref 0.3–1.2)
Total Protein: 6.1 g/dL — ABNORMAL LOW (ref 6.5–8.1)

## 2015-03-29 LAB — BLOOD GAS, ARTERIAL
ACID-BASE DEFICIT: 2.7 mmol/L — AB (ref 0.0–2.0)
BICARBONATE: 24.2 meq/L — AB (ref 20.0–24.0)
Drawn by: 441381
FIO2: 0.55
O2 SAT: 92.9 %
PEEP/CPAP: 12 cmH2O
PH ART: 7.208 — AB (ref 7.350–7.450)
Patient temperature: 98.6
RATE: 35 resp/min
TCO2: 26.2 mmol/L (ref 0–100)
VT: 400 mL
pCO2 arterial: 63.3 mmHg (ref 35.0–45.0)
pO2, Arterial: 80 mmHg (ref 80.0–100.0)

## 2015-03-29 LAB — HEPATITIS B CORE ANTIBODY, TOTAL: Hep B Core Total Ab: NEGATIVE

## 2015-03-29 LAB — GLUCOSE, CAPILLARY
GLUCOSE-CAPILLARY: 156 mg/dL — AB (ref 65–99)
GLUCOSE-CAPILLARY: 141 mg/dL — AB (ref 65–99)
GLUCOSE-CAPILLARY: 174 mg/dL — AB (ref 65–99)
Glucose-Capillary: 171 mg/dL — ABNORMAL HIGH (ref 65–99)
Glucose-Capillary: 172 mg/dL — ABNORMAL HIGH (ref 65–99)
Glucose-Capillary: 172 mg/dL — ABNORMAL HIGH (ref 65–99)

## 2015-03-29 LAB — HEPATITIS B SURFACE ANTIGEN: Hepatitis B Surface Ag: NEGATIVE

## 2015-03-29 MED ORDER — HEPARIN SODIUM (PORCINE) 1000 UNIT/ML DIALYSIS: 20.0000 [IU]/kg | INTRAMUSCULAR | Status: DC | PRN

## 2015-03-29 NOTE — Progress Notes (Signed)
Notified Dr. Isaiah SergeMannam [place] of pt's temp of 103.5 and HR of 140 ST.  Cooling blanket ordered and applied.

## 2015-03-29 NOTE — Progress Notes (Addendum)
PULMONARY / CRITICAL CARE MEDICINE   Name: Juan RipperRobert Cameron MRN: [id number] DOB: Jun 27, 1975    ADMISSION DATE:  03/11/2015  CONSULTATION DATE: 2/28  REFERRING MD: Triad  CHIEF COMPLAINT: resp failure  HISTORY OF PRESENT ILLNESS:  [age] autistic male recently in hospital for multilobar pneumonia and dc'd on 6 l [place] and returned in 24 hours with sats in 50%. He was discharged on 03/10/15 after treatment for CAP, NOI, and was on 6 l [place]. Sister [facility]) checked sats and he was 50% and [facility] and required 80% fio2 on NIVMS with RR 45. CXR consistent early ARDS, 2d echo with nl EF but grade 2 dysfunction. PCCM called to bedside on 3/1 with anticipation of requiring to be intubated.  SUBJECTIVE:  HD started 3/16 O2 needs better overall  VITAL SIGNS: BP 130/69 mmHg  Pulse 99  Temp(Src) 98.1 F (36.7 C) (Oral)  Resp 35  Ht 5\' 9"  (1.753 m)  Wt 239 lb 3.2 oz (108.5 kg)  BMI 35.31 kg/m2  SpO2 97%  HEMODYNAMICS:    VENTILATOR SETTINGS: Vent Mode:  [-] PRVC FiO2 (%):  [50 %-55 %] 50 % Set Rate:  [35 bmp] 35 bmp Vt Set:  [400 mL] 400 mL PEEP:  [12 cmH20] 12 cmH20 Plateau Pressure:  [25 cmH20-38 cmH20] 38 cmH20  INTAKE / OUTPUT: I/O last 3 completed shifts: In: 4409 [I.V.:2489; NG/GT:1870; IV Piggyback:50] Out: 2705 [Urine:655; Other:2000; Stool:50]  PHYSICAL EXAMINATION: General: Sedated on vent Neuro: Sedated , perrl HEENT: trach clean Cardiovascular: RRT s1 s2  Lungs: coarse Abdomen: NT, ND, hypoactive bowel sounds. Musculoskeletal: edema noted Skin: Warm, moist  LABS:  BMET  Recent Labs Lab 03/27/15 0501 03/28/15 0400 03/29/15 0510  NA 148* 143 140  K 5.2* 4.6 5.4*  CL 98* 98* 96*  CO2 24 25 25   BUN 210* 165* 167*  CREATININE 8.88* 7.11* 7.02*  GLUCOSE 174* 200* 180*   Electrolytes  Recent Labs Lab 03/23/15 0418 03/24/15 0345  03/27/15 0501 03/28/15 0400 03/29/15 0510  CALCIUM 8.7* 8.9  < > 8.0* 8.0* 8.1*  MG 2.4  2.5*  --  3.5*  --   --   PHOS 8.3* 7.1*  --  11.6*  --   --   < > = values in this interval not displayed. CBC  Recent Labs Lab 03/26/15 0715 03/28/15 0400 03/29/15 0510  WBC 19.6* 18.2* 19.4*  HGB 8.8* 7.2* 7.4*  HCT 26.4* 22.1* 22.0*  PLT 188 153 177   Coag's No results for input(s): APTT, INR in the last 168 hours. Sepsis Markers No results for input(s): LATICACIDVEN, PROCALCITON, O2SATVEN in the last 168 hours. ABG  Recent Labs Lab 03/27/15 0924 03/28/15 0326 03/29/15 0403  PHART 7.139* 7.236* 7.208*  PCO2ART 77.5* 64.9* 63.3*  PO2ART 102.0* 89.0 80.0   Liver Enzymes  Recent Labs Lab 03/26/15 0715 03/28/15 0400 03/29/15 0510  AST 54* 45* 193*  ALT 35 38 53  ALKPHOS 110 104 89  BILITOT 2.1* 0.7 0.8  ALBUMIN 2.2* 2.3* 2.3*    Cardiac Enzymes  Recent Labs Lab 03/22/15 1725 03/22/15 2304 03/23/15 0418  TROPONINI 0.08* 0.11* 0.07*   Glucose  Recent Labs Lab 03/28/15 1211 03/28/15 1602 03/28/15 1857 03/29/15 0004 03/29/15 0308 03/29/15 0858  GLUCAP 118* 114* 154* 172* 156* 172*   Imaging Dg Chest Port 1 View  03/29/2015  CLINICAL DATA:  ARDS EXAM: PORTABLE CHEST 1 VIEW COMPARISON:  Chest radiograph from one day prior. FINDINGS: Tracheostomy tube tip  overlies the tracheal air column at the thoracic inlet. Enteric tube enters the stomach with the tip not seen on this image. Right internal jugular central venous catheter terminates in the middle third of the superior vena cava. Stable cardiomediastinal silhouette with top-normal heart size. No pneumothorax. No pleural effusion. Hazy opacities throughout both lungs are not appreciably changed. IMPRESSION: 1. Well-positioned support structures as described. No pneumothorax. 2. Stable hazy opacities throughout both lungs, consistent with noncardiogenic pulmonary edema/ARDS. Electronically Signed   By: Delbert Phenix M.D.   On: 03/29/2015 10:06   CULTURES: 2/28 bc x 2 negative 2/28 uc negative 2/28 RVP  negative Sputum culture 3/4, 3/5, 3/9 >>yeast Blood culture 3/4 >>  ANTIBIOTICS: 2/28 vanc>>3/10 2/28 cefepime>>3/10 3/14 ceftaz>>>plan stop 3/21 3/14 vanc>>>3/16 3/14 anidulo>>>plan stop 21  SIGNIFICANT EVENTS: 2/28 readmit to hospital  03/12/15 Intubated 3/1 Bronchoscopy  3/14- unable to ventilate or oxygenate< DNR 3/15- pos 6.8 liters 3/16- HD started  LINES/TUBES: Central line L IJ TLC>3/1 ETT out Trach 3/9>>> Rt ij hd 3/16>>>  ASSESSMENT / PLAN:  PULMONARY A: Acute hypoxic resp failure. Suspected recurrent pna/HCAP/ asp pna.  Worsening ards, refractory R/o VAP R/o fungal component Suspected undiagnosed OSA pulm edema component rul ATX P:  6 cc/kg, lungs remain stiff Goal plat less 30 With pressures, would reduce peep 12 and if needed to 60% okay abg reviewed, permissive hypercapnia Neg balance needed on HD, consider daily HD? abg in am  Dc paralysis Dc mucomysts Dc chest pt  CARDIOVASCULAR A:  Septic Shock -- resolved. P:  Tele Pressors would be futile  RENAL Lab Results  Component Value Date   CREATININE 7.02* 03/29/2015   CREATININE 7.11* 03/28/2015   CREATININE 8.88* 03/27/2015  A:  Acute Kidney Injury, ARF P:  HD , consider daily  Dc free water  GASTROINTESTINAL A:  Obtained BM P:  Cont TF mir, colace  HEMATOLOGIC A:  Hx of sickle cell trait VTE  P:  Continue SQ heparin.  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 Cultures (-) so far.  At risk VAP now P:  Continued current coverage- ad stop date Some improved vent, keep antifungal coverage- add stop date 7 days  ENDOCRINE CBG (last 3)   Recent Labs  03/29/15 0004 03/29/15 0308 03/29/15 0858  GLUCAP 172* 156* 172*     A:  hyperglycemia  P:  ISS ordered moderate.controlled  NEUROLOGIC A:  Mentally impaired with limited vocabulary of 500 words.  Autism  Decreased LOC from metabolic  disarray. P:  RASS goal: -3 Versed, fentanyl Dc nimbex, if needed use int paralysis vec q2h prn severe debiliation  Brett Canales Minor ACNP Adolph Pollack PCCM Pager [phone] till 3 pm If no answer page [phone] 03/29/2015, 10:15 AM   Attending Note:  I have examined patient, reviewed labs, studies and notes. I have discussed the case with S Minor, and I agree with the data and plans as amended above. HCAP + ARDS with VDRF and ARF requiring HD. Plan to continue current supportive care.   Levy Pupa, MD, PhD 03/29/2015, 7:04 PM [facility] [phone] or if no answer [phone]

## 2015-03-29 NOTE — Progress Notes (Signed)
eLink Physician-Brief Progress Note Patient Name: Juan RipperRobert Cameron DOB: May 01, 1975 MRN: [id number]   Date of Service  03/29/2015  HPI/Events of Note  mild tachy and vent dysnch on cam care     eICU Interventions  monitor     Intervention Category Intermediate Interventions: Other:  Brycelynn Stampley 03/29/2015, 11:45 PM

## 2015-03-29 NOTE — Progress Notes (Signed)
Admit: 03/11/2015 LOS: 18  47M AKI (nl BL SCr), ARDS and prolonged VDRF from PNA; Septic Shock  Subjective:  HD yesterday #2, Qb 250 T 3h, 2L UF; tolerated BUN remains elevated on high protein TF High fevers overnight  03/17 0701 - 03/18 0700 In: 2225.9 [I.V.:1025.9; NG/GT:1150; IV Piggyback:50] Out: 2295 [Urine:245; Stool:50]  Filed Weights   03/28/15 1300 03/28/15 1630 03/29/15 0500  Weight: 108.2 kg (238 lb 8.6 oz) 106.2 kg (234 lb 2.1 oz) 108.5 kg (239 lb 3.2 oz)    Scheduled Meds: . antiseptic oral rinse  7 mL Mouth Rinse QID  . cefTAZidime (FORTAZ)  IV  1 g Intravenous Q24H  . chlorhexidine gluconate  15 mL Mouth Rinse BID  . docusate  100 mg Per Tube Daily  . feeding supplement (PRO-STAT SUGAR FREE 64)  30 mL Per Tube BID  . feeding supplement (VITAL HIGH PROTEIN)  1,000 mL Per Tube Q24H  . heparin subcutaneous  5,000 Units Subcutaneous 3 times per day  . insulin aspart  2-6 Units Subcutaneous 6 times per day  . ipratropium-albuterol  3 mL Nebulization Q4H  . methylPREDNISolone (SOLU-MEDROL) injection  80 mg Intravenous 3 times per day  . pantoprazole sodium  40 mg Per Tube Q1200  . polyethylene glycol  17 g Oral Daily  . sodium chloride flush  3 mL Intravenous Q12H   Continuous Infusions: . fentaNYL infusion INTRAVENOUS 200 mcg/hr (03/29/15 0535)  . midazolam (VERSED) infusion 6 mg/hr (03/29/15 0535)  . norepinephrine (LEVOPHED) Adult infusion Stopped (03/23/15 0500)   PRN Meds:.sodium chloride, sodium chloride, acetaminophen (TYLENOL) oral liquid 160 mg/5 mL **OR** acetaminophen, alteplase, fentaNYL, heparin, heparin, lidocaine (PF), lidocaine-prilocaine, midazolam, ondansetron **OR** ondansetron (ZOFRAN) IV, pentafluoroprop-tetrafluoroeth, vecuronium, white petrolatum  Current Labs: reviewed   Physical Exam:  Blood pressure 136/68, pulse 100, temperature 100.1 F (37.8 C), temperature source Rectal, resp. rate 35, height  (1.753 m), weight 108.5 kg (239 lb  3.2 oz), SpO2 98 %. Intubated, paralyzed, sedated ETT in place Tachy, regular Coarse bs b/l R IJ HD cath in place  A 1. Nonoliguric AKI with marked azotemia 1. Normal baseline SCr 2. Started HD 3/16 with R IJ temp cath as short term approach 2. ARDS / Trach / Prolonged VDRF 2/2 PNA 3. Resolved septic shock 4. Autism 5. ? Fungal PNA on anidalufungin  P 1. HD again today, cont UF, gentle cleaning given azotemia 2. Family and I re-confirmed 3/17 and agreed upon short term attempt, optimism for renal recovery but that long term HD would not be desired / indicated  Sabra Heck MD 03/29/2015, 7:26 AM   Recent Labs Lab 03/23/15 0418 03/24/15 0345  03/27/15 0501 03/28/15 0400 03/29/15 0510  NA 144 144  < > 148* 143 140  K 3.5 4.0  < > 5.2* 4.6 5.4*  CL 99* 98*  < > 98* 98* 96*  CO2 28 29  < > GLUCOSE 149* 120*  < > 174* 200* 180*  BUN 74* 90*  < > 210* 165* 167*  CREATININE 3.51* 4.49*  < > 8.88* 7.11* 7.02*  CALCIUM 8.7* 8.9  < > 8.0* 8.0* 8.1*  PHOS 8.3* 7.1*  --  11.6*  --   --   < > = values in this interval not displayed.  Recent Labs Lab 03/26/15 0715 03/28/15 0400 03/29/15 0510  WBC 19.6* 18.2* 19.4*  NEUTROABS 17.4* 15.7* 16.2*  HGB 8.8* 7.2* 7.4*  HCT 26.4* 22.1* 22.0*  MCV 81.7  80.4 80.3  PLT 188 153 177

## 2015-03-29 NOTE — Progress Notes (Signed)
Spoke with sister twice today  When she call for update. Cousins and other families also came to see patient.

## 2015-03-30 ENCOUNTER — Inpatient Hospital Stay (HOSPITAL_COMMUNITY): Payer: Medicare Other

## 2015-03-30 DIAGNOSIS — S27309D Unspecified injury of lung, unspecified, subsequent encounter: Secondary | ICD-10-CM

## 2015-03-30 LAB — BASIC METABOLIC PANEL
ANION GAP: 20 — AB (ref 5–15)
BUN: 140 mg/dL — ABNORMAL HIGH (ref 6–20)
CO2: 25 mmol/L (ref 22–32)
Calcium: 8.3 mg/dL — ABNORMAL LOW (ref 8.9–10.3)
Chloride: 93 mmol/L — ABNORMAL LOW (ref 101–111)
Creatinine, Ser: 5.22 mg/dL — ABNORMAL HIGH (ref 0.61–1.24)
GFR, EST AFRICAN AMERICAN: 15 mL/min — AB (ref >60)
GFR, EST NON AFRICAN AMERICAN: 13 mL/min — AB (ref >60)
GLUCOSE: 167 mg/dL — AB (ref 65–99)
POTASSIUM: 5.9 mmol/L — AB (ref 3.5–5.1)
Sodium: 138 mmol/L (ref 135–145)

## 2015-03-30 LAB — GLUCOSE, CAPILLARY
GLUCOSE-CAPILLARY: 142 mg/dL — AB (ref 65–99)
GLUCOSE-CAPILLARY: 171 mg/dL — AB (ref 65–99)
GLUCOSE-CAPILLARY: 179 mg/dL — AB (ref 65–99)
Glucose-Capillary: 150 mg/dL — ABNORMAL HIGH (ref 65–99)
Glucose-Capillary: 167 mg/dL — ABNORMAL HIGH (ref 65–99)
Glucose-Capillary: 173 mg/dL — ABNORMAL HIGH (ref 65–99)

## 2015-03-30 LAB — RENAL FUNCTION PANEL
ANION GAP: 10 (ref 5–15)
Albumin: 1.7 g/dL — ABNORMAL LOW (ref 3.5–5.0)
BUN: 43 mg/dL — ABNORMAL HIGH (ref 6–20)
CALCIUM: 9.1 mg/dL (ref 8.9–10.3)
CHLORIDE: 100 mmol/L — AB (ref 101–111)
CO2: 26 mmol/L (ref 22–32)
CREATININE: 1.33 mg/dL — AB (ref 0.61–1.24)
Glucose, Bld: 298 mg/dL — ABNORMAL HIGH (ref 65–99)
Phosphorus: 5.4 mg/dL — ABNORMAL HIGH (ref 2.5–4.6)
Potassium: 5 mmol/L (ref 3.5–5.1)
SODIUM: 136 mmol/L (ref 135–145)

## 2015-03-30 LAB — MAGNESIUM: MAGNESIUM: 2.7 mg/dL — AB (ref 1.7–2.4)

## 2015-03-30 LAB — CBC
HCT: 24.1 % — ABNORMAL LOW (ref 39.0–52.0)
Hemoglobin: 8 g/dL — ABNORMAL LOW (ref 13.0–17.0)
MCH: 26.3 pg (ref 26.0–34.0)
MCHC: 33.2 g/dL (ref 30.0–36.0)
MCV: 79.3 fL (ref 78.0–100.0)
PLATELETS: 219 10*3/uL (ref 150–400)
RBC: 3.04 MIL/uL — AB (ref 4.22–5.81)
RDW: 15.2 % (ref 11.5–15.5)
WBC: 33.9 10*3/uL — AB (ref 4.0–10.5)

## 2015-03-30 LAB — PHOSPHORUS: PHOSPHORUS: 9.9 mg/dL — AB (ref 2.5–4.6)

## 2015-03-30 MED ORDER — CHLORHEXIDINE GLUCONATE 0.12 % MT SOLN
OROMUCOSAL | Status: AC
  Filled 2015-03-30: qty 15

## 2015-03-30 MED ORDER — HEPARIN SODIUM (PORCINE) 1000 UNIT/ML DIALYSIS
20.0000 [IU]/kg | INTRAMUSCULAR | Status: DC | PRN
Start: 2015-03-30 — End: 2015-03-31

## 2015-03-30 MED ORDER — SODIUM POLYSTYRENE SULFONATE 15 GM/60ML PO SUSP
30.0000 g | Freq: Once | ORAL | Status: AC
  Administered 2015-03-30: 30 g
  Filled 2015-03-30: qty 120

## 2015-03-30 NOTE — Progress Notes (Signed)
eLink Physician-Brief Progress Note Patient Name: Juan RipperRobert Cameron DOB: Nov 25, 1975 MRN: [id number]   Date of Service  03/30/2015  HPI/Events of Note   Recent Labs Lab 03/26/15 0715 03/27/15 0501 03/28/15 0400 03/29/15 0510 03/30/15 0425  K 5.4* 5.2* 4.6 5.4* 5.9*    Recent Labs Lab 03/26/15 0715 03/27/15 0501 03/28/15 0400 03/29/15 0510 03/30/15 0425  CREATININE 7.51* 8.88* 7.11* 7.02* 5.22*      eICU Interventions  Hyperkalemia - HD pending  Plan kayexalae 30gm     Intervention Category Minor Interventions: Electrolytes abnormality - evaluation and management  Juan Cameron 03/30/2015, 5:08 AM

## 2015-03-30 NOTE — Progress Notes (Signed)
PULMONARY / CRITICAL CARE MEDICINE   Name: Juan Cameron MRN: [id number] DOB: [birth year]    ADMISSION DATE:  03/11/2015  CONSULTATION DATE: 2/28  REFERRING MD: Triad  CHIEF COMPLAINT: resp failure  HISTORY OF PRESENT ILLNESS:  [age] autistic male recently in hospital for multilobar pneumonia and dc'd on 6 l [place] and returned in 24 hours with sats in 50%. He was discharged on 03/10/15 after treatment for CAP, NOI, and was on 6 l [place]. [facility](HCPOA) checked sats and he was 50% and therefore was transported back to [facility] and required 80% fio2 on NIVMS with RR 45. CXR consistent early ARDS, 2d echo with nl EF but grade 2 dysfunction. PCCM called to bedside on 3/1 with anticipation of requiring to be intubated.  SUBJECTIVE:  HD started 3/16 O2 needs better overall  VITAL SIGNS: BP 153/69 mmHg  Pulse 128  Temp(Src) 102.7 F (39.3 C) (Oral)  Resp 39  Ht 5\' 9"  (1.753 m)  Wt 233 lb 0.4 oz (105.7 kg)  BMI 34.40 kg/m2  SpO2 97%  HEMODYNAMICS:    VENTILATOR SETTINGS: Vent Mode:  [-] PRVC FiO2 (%):  [50 %] 50 % Set Rate:  [35 bmp] 35 bmp Vt Set:  [400 mL] 400 mL PEEP:  [10 cmH20-12 cmH20] 10 cmH20 Plateau Pressure:  [21 cmH20-36 cmH20] 25 cmH20  INTAKE / OUTPUT: I/O last 3 completed shifts: In: 2789.1 [I.V.:1279.1; Other:60; NG/GT:1450] Out: 3435 [Urine:255; Other:3000; Stool:180]  PHYSICAL EXAMINATION: General: Sedated on vent Neuro: Sedated , perrl HEENT: trach clean Cardiovascular: RRT s1 s2  Lungs: coarse Abdomen: NT, ND, hypoactive bowel sounds. Musculoskeletal: edema noted Skin: Warm, moist  LABS:  BMET  Recent Labs Lab 03/28/15 0400 03/29/15 0510 03/30/15 0425  NA 143 140 138  K 4.6 5.4* 5.9*  CL 98* 96* 93*  CO2 25 25 25   BUN 165* 167* 140*  CREATININE 7.11* 7.02* 5.22*  GLUCOSE 200* 180* 167*   Electrolytes  Recent Labs Lab 03/24/15 0345  03/27/15 0501 03/28/15 0400 03/29/15 0510 03/30/15 0425  CALCIUM 8.9  < > 8.0* 8.0* 8.1* 8.3*  MG  2.5*  --  3.5*  --   --  2.7*  PHOS 7.1*  --  11.6*  --   --  9.9*  < > = values in this interval not displayed. CBC  Recent Labs Lab 03/28/15 0400 03/29/15 0510 03/30/15 0425  WBC 18.2* 19.4* 33.9*  HGB 7.2* 7.4* 8.0*  HCT 22.1* 22.0* 24.1*  PLT 153 177 219   Coag's No results for input(s): APTT, INR in the last 168 hours. Sepsis Markers No results for input(s): LATICACIDVEN, PROCALCITON, O2SATVEN in the last 168 hours. ABG  Recent Labs Lab 03/27/15 0924 03/28/15 0326 03/29/15 0403  PHART 7.139* 7.236* 7.208*  PCO2ART 77.5* 64.9* 63.3*  PO2ART 102.0* 89.0 80.0   Liver Enzymes  Recent Labs Lab 03/26/15 0715 03/28/15 0400 03/29/15 0510  AST 54* 45* 193*  ALT 35 38 53  ALKPHOS 110 104 89  BILITOT 2.1* 0.7 0.8  ALBUMIN 2.2* 2.3* 2.3*    Cardiac Enzymes No results for input(s): TROPONINI, PROBNP in the last 168 hours. Glucose  Recent Labs Lab 03/29/15 1237 03/29/15 1657 03/29/15 2039 03/29/15 2349 03/30/15 0512 03/30/15 0900  GLUCAP 171* 141* 174* 167* 142* 150*   Imaging Dg Chest Port 1 View  03/30/2015  CLINICAL DATA:  Respiratory failure EXAM: PORTABLE CHEST 1 VIEW COMPARISON:  Chest radiograph from one day prior. FINDINGS: Tracheostomy tube tip overlies the tracheal air column at  the thoracic inlet. Right internal jugular central venous catheter terminates in the middle third of the superior vena cava. Stable cardiomediastinal silhouette with normal heart size. No pneumothorax. No pleural effusion. Mild hazy opacities throughout both lungs, slightly improved. IMPRESSION: 1. Well-positioned support hardware as described. 2. Mild hazy opacities throughout both lungs, slightly improved, consistent with improving noncardiogenic pulmonary edema/ARDS. Electronically Signed   By: Delbert Phenix M.D.   On: 03/30/2015 08:46   CULTURES: 2/28 bc x 2 negative 2/28 uc negative 2/28 RVP negative Sputum culture 3/4, 3/5, 3/9 >>yeast Blood culture 3/4  >>  ANTIBIOTICS: 2/28 vanc>>3/10 2/28 cefepime>>3/10 3/14 ceftaz>>>plan stop 3/21 3/14 vanc>>>3/16 3/14 anidulo>>>plan stop 21  SIGNIFICANT EVENTS: 2/28 readmit to hospital  03/12/15 Intubated 3/1 Bronchoscopy  3/14- unable to ventilate or oxygenate< DNR 3/15- pos 6.8 liters 3/16- HD started  LINES/TUBES: Central line L IJ TLC>3/1 ETT out Trach 3/9>>> Rt ij hd 3/16>>>  ASSESSMENT / PLAN:  PULMONARY A: Acute hypoxic resp failure. Suspected recurrent pna/HCAP/ asp pna.  Worsening ards, refractory R/o VAP R/o fungal component Suspected undiagnosed OSA pulm edema component rul ATX P:  6 cc/kg, Goal plat less 30 Wean PEEP and FiO2 as able Neg balance needed on HD, consider daily HD?   CARDIOVASCULAR A:  Septic Shock -- resolved. P:  Tele  RENAL  Recent Labs Lab 03/28/15 0400 03/29/15 0510 03/30/15 0425  K 4.6 5.4* 5.9*     Lab Results  Component Value Date   CREATININE 5.22* 03/30/2015   CREATININE 7.02* 03/29/2015   CREATININE 7.11* 03/28/2015  A:  Acute Kidney Injury, ARF Hyperkalmia P:  HD , consider daily  Dc free water  GASTROINTESTINAL A:  Obtained BM P:  Cont TF Bowel regimen  HEMATOLOGIC A:  Hx of sickle cell trait VTE  P:  Continue SQ heparin.  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 Cultures (-) so far.  At risk VAP now P:  Continued current coverage- add stop date Some improved vent, keep antifungal coverage- add stop date 7 days  ENDOCRINE CBG (last 3)   Recent Labs  03/29/15 2349 03/30/15 0512 03/30/15 0900  GLUCAP 167* 142* 150*     A:  hyperglycemia  P:  ISS ordered moderate.controlled  NEUROLOGIC A:  Mentally impaired with limited vocabulary of 500 words.  Autism  Decreased LOC from metabolic disarray. P:  RASS goal: -3 Versed, fentanyl Dc nimbex 3/18, if needed use int paralysis vec q2h prn severe  debiliation  Brett Canales Minor ACNP Ad[facility] Pollack PCCM Pager [phone] till 3 pm If no answer page [phone] 03/30/2015, 11:39 AM  Attending Note:  I have examined patient, reviewed labs, studies and notes. I have discussed the case with S Minor, and I agree with the data and plans as amended above. HCAP and ARDS, c/b by encephalopathy, acute renal failure. Slow improvement through the weekend. HD per renal's plans. Independent critical care time is 31 minutes.   Levy Pupa, MD, PhD 03/30/2015, 9:25 PM [facility] [phone] or if no answer [phone]

## 2015-03-30 NOTE — Progress Notes (Signed)
Admit: 03/11/2015 LOS: 19  24M AKI (nl BL SCr), ARDS and prolonged VDRF from PNA; Septic Shock  Subjective:  HD#3 yesterday, 3L UF, tolerated; Qb 300, Qd 600 Remains intermittently febrile; WBC up PEEP 12, FIO2 50% K up this AM  03/18 0701 - 03/19 0700 In: 1738 [I.V.:828; NG/GT:850] Out: 3340 [Urine:160; Stool:180]  Filed Weights   03/29/15 1430 03/29/15 1800 03/30/15 0421  Weight: 109.3 kg (240 lb 15.4 oz) 106 kg (233 lb 11 oz) 105.7 kg (233 lb 0.4 oz)    Scheduled Meds: . antiseptic oral rinse  7 mL Mouth Rinse QID  . cefTAZidime (FORTAZ)  IV  1 g Intravenous Q24H  . chlorhexidine gluconate  15 mL Mouth Rinse BID  . docusate  100 mg Per Tube Daily  . feeding supplement (PRO-STAT SUGAR FREE 64)  30 mL Per Tube BID  . feeding supplement (VITAL HIGH PROTEIN)  1,000 mL Per Tube Q24H  . heparin subcutaneous  5,000 Units Subcutaneous 3 times per day  . insulin aspart  2-6 Units Subcutaneous 6 times per day  . ipratropium-albuterol  3 mL Nebulization Q4H  . methylPREDNISolone (SOLU-MEDROL) injection  80 mg Intravenous 3 times per day  . pantoprazole sodium  40 mg Per Tube Q1200  . polyethylene glycol  17 g Oral Daily  . sodium chloride flush  3 mL Intravenous Q12H   Continuous Infusions: . fentaNYL infusion INTRAVENOUS 300 mcg/hr (03/30/15 0556)  . midazolam (VERSED) infusion 7 mg/hr (03/30/15 0524)  . norepinephrine (LEVOPHED) Adult infusion Stopped (03/23/15 0500)   PRN Meds:.sodium chloride, sodium chloride, acetaminophen (TYLENOL) oral liquid 160 mg/5 mL **OR** acetaminophen, alteplase, fentaNYL, heparin, heparin, heparin, lidocaine (PF), lidocaine-prilocaine, midazolam, ondansetron **OR** ondansetron (ZOFRAN) IV, pentafluoroprop-tetrafluoroeth, vecuronium, white petrolatum  Current Labs: reviewed   Physical Exam:  Blood pressure 160/72, pulse 122, temperature 101.8 F (38.8 C), temperature source Oral, resp. rate 30, height  (1.753 m), weight 105.7 kg (233 lb 0.4 oz),  SpO2 98 %. Intubated, paralyzed, sedated ETT in place Tachy, regular Coarse bs b/l R IJ HD cath in place  A 1. Nonoliguric AKI with marked azotemia 1. Normal baseline SCr 2. Started HD 3/16 with R IJ temp cath as short term approach 3. Family and I re-confirmed 3/17 and agreed upon short term attempt, optimism for renal recovery but that long term HD would not be desired / indicated 2. ARDS / Trach / Prolonged VDRF 2/2 PNA 3. Resolved septic shock 4. Autism 5. ? Fungal PNA on anidalufungin 6. Hyperkalemia  P 1. HD again today, cont UF, gentle cleaning given azotemia  Sabra Heck MD 03/30/2015, 7:15 AM   Recent Labs Lab 03/24/15 0345  03/27/15 0501 03/28/15 0400 03/29/15 0510 03/30/15 0425  NA 144  < > 148* 143 140 138  K 4.0  < > 5.2* 4.6 5.4* 5.9*  CL 98*  < > 98* 98* 96* 93*  CO2 29  < > GLUCOSE 120*  < > 174* 200* 180* 167*  BUN 90*  < > 210* 165* 167* 140*  CREATININE 4.49*  < > 8.88* 7.11* 7.02* 5.22*  CALCIUM 8.9  < > 8.0* 8.0* 8.1* 8.3*  PHOS 7.1*  --  11.6*  --   --  9.9*  < > = values in this interval not displayed.  Recent Labs Lab 03/26/15 0715 03/28/15 0400 03/29/15 0510 03/30/15 0425  WBC 19.6* 18.2* 19.4* 33.9*  NEUTROABS 17.4* 15.7* 16.2*  --   HGB 8.8* 7.2*  7.4* 8.0*  HCT 26.4* 22.1* 22.0* 24.1*  MCV 81.7 80.4 80.3 79.3  PLT 188 153 177 219

## 2015-03-31 ENCOUNTER — Inpatient Hospital Stay (HOSPITAL_COMMUNITY): Payer: Medicare Other

## 2015-03-31 DIAGNOSIS — G9340 Encephalopathy, unspecified: Secondary | ICD-10-CM

## 2015-03-31 LAB — GLUCOSE, CAPILLARY
GLUCOSE-CAPILLARY: 142 mg/dL — AB (ref 65–99)
GLUCOSE-CAPILLARY: 147 mg/dL — AB (ref 65–99)
GLUCOSE-CAPILLARY: 165 mg/dL — AB (ref 65–99)
GLUCOSE-CAPILLARY: 174 mg/dL — AB (ref 65–99)
Glucose-Capillary: 185 mg/dL — ABNORMAL HIGH (ref 65–99)
Glucose-Capillary: 149 mg/dL — ABNORMAL HIGH (ref 65–99)

## 2015-03-31 LAB — POCT I-STAT 3, ART BLOOD GAS (G3+)
ACID-BASE EXCESS: 4 mmol/L — AB (ref 0.0–2.0)
Acid-Base Excess: 8 mmol/L — ABNORMAL HIGH (ref 0.0–2.0)
BICARBONATE: 30.7 meq/L — AB (ref 20.0–24.0)
Bicarbonate: 32.5 mEq/L — ABNORMAL HIGH (ref 20.0–24.0)
O2 SAT: 98 %
O2 Saturation: 99 %
PCO2 ART: 43.3 mmHg (ref 35.0–45.0)
PH ART: 7.346 — AB (ref 7.350–7.450)
PH ART: 7.483 — AB (ref 7.350–7.450)
TCO2: 32 mmol/L (ref 0–100)
TCO2: 34 mmol/L (ref 0–100)
pCO2 arterial: 56.1 mmHg — ABNORMAL HIGH (ref 35.0–45.0)
pO2, Arterial: 114 mmHg — ABNORMAL HIGH (ref 80.0–100.0)
pO2, Arterial: 110 mmHg — ABNORMAL HIGH (ref 80.0–100.0)

## 2015-03-31 LAB — CBC
HCT: 24.1 % — ABNORMAL LOW (ref 39.0–52.0)
HEMOGLOBIN: 8 g/dL — AB (ref 13.0–17.0)
MCH: 26.6 pg (ref 26.0–34.0)
MCHC: 33.2 g/dL (ref 30.0–36.0)
MCV: 80.1 fL (ref 78.0–100.0)
PLATELETS: 214 10*3/uL (ref 150–400)
RBC: 3.01 MIL/uL — AB (ref 4.22–5.81)
RDW: 15 % (ref 11.5–15.5)
WBC: 41.9 10*3/uL — AB (ref 4.0–10.5)

## 2015-03-31 LAB — BASIC METABOLIC PANEL
Anion gap: 16 — ABNORMAL HIGH (ref 5–15)
BUN: 124 mg/dL — ABNORMAL HIGH (ref 6–20)
CHLORIDE: 96 mmol/L — AB (ref 101–111)
CO2: 25 mmol/L (ref 22–32)
CREATININE: 4.06 mg/dL — AB (ref 0.61–1.24)
Calcium: 8.1 mg/dL — ABNORMAL LOW (ref 8.9–10.3)
GFR, EST AFRICAN AMERICAN: 20 mL/min — AB (ref >60)
GFR, EST NON AFRICAN AMERICAN: 17 mL/min — AB (ref >60)
Glucose, Bld: 197 mg/dL — ABNORMAL HIGH (ref 65–99)
POTASSIUM: 5 mmol/L (ref 3.5–5.1)
SODIUM: 137 mmol/L (ref 135–145)

## 2015-03-31 LAB — TRIGLYCERIDES: TRIGLYCERIDES: 378 mg/dL — AB (ref <150)

## 2015-03-31 MED ORDER — METHYLPREDNISOLONE SODIUM SUCC 40 MG IJ SOLR
40.0000 mg | Freq: Two times a day (BID) | INTRAMUSCULAR | Status: AC
  Administered 2015-03-31 – 2015-04-01 (×3): 40 mg via INTRAVENOUS
  Filled 2015-03-31 (×3): qty 1

## 2015-03-31 MED ORDER — POLYETHYLENE GLYCOL 3350 17 G PO PACK
17.0000 g | PACK | Freq: Every day | ORAL | Status: DC | PRN
  Administered 2015-04-01: 17 g via ORAL
  Filled 2015-03-31 (×2): qty 1

## 2015-03-31 NOTE — Progress Notes (Signed)
PULMONARY / CRITICAL CARE MEDICINE   Name: Juan RipperRobert Dibiasio MRN: [id number] DOB: [birth year]    ADMISSION DATE:  03/11/2015  CONSULTATION DATE: 2/28  REFERRING MD: Triad  CHIEF COMPLAINT: Acute resp failure  BRIEF:  [age] autistic male recently in hospital for multilobar pneumonia and dc'd on 6 l [place] and returned in 24 hours with sats in 50%. He was discharged on 03/10/15 after treatment for CAP, NOI, and was on 6 l [place]. [facility](HCPOA) checked sats and he was 50% and therefore was [facility] and required 80% fio2 on NIVMS with RR 45. He developed ARDS adn was intubated  SUBJECTIVE:  Oxygenation improving Vent dyssynchrony  VITAL SIGNS: BP 128/75 mmHg  Pulse 102  Temp(Src) 98.1 F (36.7 C) (Core (Comment))  Resp 17  Ht 5\' 9"  (1.753 m)  Wt 109.2 kg (240 lb 11.9 oz)  BMI 35.54 kg/m2  SpO2 99%  HEMODYNAMICS:    VENTILATOR SETTINGS: Vent Mode:  [-] PRVC FiO2 (%):  [50 %] 50 % Set Rate:  [35 bmp] 35 bmp Vt Set:  [400 mL] 400 mL PEEP:  [10 cmH20] 10 cmH20 Plateau Pressure:  [21 cmH20-37 cmH20] 21 cmH20  INTAKE / OUTPUT: I/O last 3 completed shifts: In: 3137.7 [I.V.:1207.7; NG/GT:1830; IV Piggyback:100] Out: 3063 [Urine:63; Other:3000]  PHYSICAL EXAMINATION: General: sedated heavily on vent HEENT: trach site c/d/i, NG in plac PULM: few crackles, vent supported breaths CV: RRR, no mgr GI: Belly soft, nontender MSK: normal bulk and tone Derm: no rash or skin breakdown Neuro: sedated heavily on vent  LABS:  BMET  Recent Labs Lab 03/30/15 0425 03/30/15 1740 03/31/15 0423  NA 138 136 137  K 5.9* 5.0 5.0  CL 93* 100* 96*  CO2 25 26 25   BUN 140* 43* 124*  CREATININE 5.22* 1.33* 4.06*  GLUCOSE 167* 298* 197*   Electrolytes  Recent Labs Lab 03/27/15 0501  03/30/15 0425 03/30/15 1740 03/31/15 0423  CALCIUM 8.0*  < > 8.3* 9.1 8.1*  MG 3.5*  --  2.7*  --   --   PHOS 11.6*  --  9.9* 5.4*  --   < > = values in this interval not  displayed. CBC  Recent Labs Lab 03/29/15 0510 03/30/15 0425 03/31/15 0423  WBC 19.4* 33.9* 41.9*  HGB 7.4* 8.0* 8.0*  HCT 22.0* 24.1* 24.1*  PLT 177 219 214   Coag's No results for input(s): APTT, INR in the last 168 hours. Sepsis Markers No results for input(s): LATICACIDVEN, PROCALCITON, O2SATVEN in the last 168 hours. ABG  Recent Labs Lab 03/28/15 0326 03/29/15 0403 03/31/15 0431  PHART 7.236* 7.208* 7.346*  PCO2ART 64.9* 63.3* 56.1*  PO2ART 89.0 80.0 114.0*   Liver Enzymes  Recent Labs Lab 03/26/15 0715 03/28/15 0400 03/29/15 0510 03/30/15 1740  AST 54* 45* 193*  --   ALT 35 38 53  --   ALKPHOS 110 104 89  --   BILITOT 2.1* 0.7 0.8  --   ALBUMIN 2.2* 2.3* 2.3* 1.7*    Cardiac Enzymes No results for input(s): TROPONINI, PROBNP in the last 168 hours. Glucose  Recent Labs Lab 03/30/15 1124 03/30/15 1646 03/30/15 2055 03/30/15 2337 03/31/15 0355 03/31/15 0909  GLUCAP 173* 171* 179* 174* 185* 165*   Imaging  3/20 CXR images personally reviewed> persistent bilateral airspace disease improved compared to last week's studies  CULTURES: 2/28 bc x 2 negative 2/28 uc negative 2/28 RVP negative Sputum culture 3/4, 3/5, 3/9 >>yeast Blood culture 3/4 >>  Blood  3/10 >  Resp culture 3/9 >  Urine 3/10 >   ANTIBIOTICS: 2/28 vanc>>3/10 2/28 cefepime>>3/10 3/14 ceftaz>>>plan stop 3/21 3/14 vanc>>>3/16 3/14 anidulo>>>plan stop 21  SIGNIFICANT EVENTS: 2/28 readmit to hospital  03/12/15 Intubated 3/1 Bronchoscopy  3/14- unable to ventilate or oxygenate< DNR 3/15- pos 6.8 liters 3/16- HD started  LINES/TUBES: Central line L IJ TLC>3/1 Trach 3/9>>> Rt ij hd 3/16>>>  ASSESSMENT / PLAN:  PULMONARY A: ARDS from severe CAP vs HCAP; use of steroids in late phase associated with increased mortality Suspected undiagnosed OSA Pulm edema > improving rul ATX P:  Decrease FiO2, PEEP for goal PaO2 55-65 Decrease RR for comfort Hopefully pressure  support this week Continue volume removal with HD VAP prevention Wean off steroids  CARDIOVASCULAR A:  Septic Shock -- resolved P:  Tele  RENAL A:  Acute Kidney Injury, ARF Hyperkalmia P:  HD pre renal  GASTROINTESTINAL A:  Constipation on high dose narcotics, improved P:  Cont TF Bowel regimen  HEMATOLOGIC A:  Hx of sickle cell trait VTE  P:  Continue SQ heparin  INFECTIOUS A:  Recurrent PNA with ARDS Leukocytosis/fever consistent with late phase ARDS and steroid effect Fever this am. Cultures (-) so far.  At risk VAP now P:  Stop Ceftaz after today Remove foley Wean off steroids Agree to monitor of of anti-fungal agents   ENDOCRINE  A:  Hyperglycemia  P:  Sliding scale insulin  NEUROLOGIC A:  Mentally impaired with limited vocabulary of 500 words Autism  Acute encephalopathy from metabolic disarray P:  RASS goal: -3 Versed, fentanyl Wean solumedrol this week severe debiliation  Cousin updated bedside on 3/20  My cc time 41 minutes  Heber , MD [place] P[facility]M Pager: [phone] Cell: [phone] After 3pm or if no response, call [phone]  03/31/2015, 11:01 AM

## 2015-03-31 NOTE — Procedures (Signed)
Patient seen on Hemodialysis. QB 400, UF goal 1.5L Treatment adjusted as needed.  Zetta BillsJay Owenn Rothermel MD [facility]. Office # [phone] Pager # [phone] 10:37 AM

## 2015-03-31 NOTE — Progress Notes (Signed)
UR Completed. Ona Rathert, RN, BSN.  [phone] 

## 2015-03-31 NOTE — Progress Notes (Signed)
Arrived to patient room 89M-15 at 0900.  Reviewed treatment plan and this RN agrees with plan.  Report received from bedside RN, Trinna PostAlex.  Consent verified.  Patient Sedated.   Lung sounds diminished to ausculation in all fields. Generalized edema. Cardiac:  ST.  Removed caps and cleansed RIJ catheter with chlorhedxidine.  Aspirated ports of heparin and flushed them with saline per protocol.  Connected and secured lines, initiated treatment at 0916.  UF Goal of 1500mL and net fluid removal 1L.  Will continue to monitor.

## 2015-03-31 NOTE — Progress Notes (Signed)
Patient ID: Juan Cameron, male   DOB: 08-Mar-1975, 40 y.o.   MRN: [id number]  [facility] Progress Note    Assessment/ Plan:   1. AKI-oliguric: Marginal urine output noted earlier today at 38 mL. Hemodialysis (4th treatment) done yesterday (on duration agreed trial of hemodialysis therapy since 3/16 to try and see if there is any meaningful change in his mentation/overall clinical status as chronic hemodialysis is not recommended or desired). Last hemodialysis treatment was done yesterday and I will order for another treatment today and then maintain a Monday/Wednesday/Friday schedule until the end of this week unless acutely needed. 2. Acute hypoxic respiratory failure/ARDS/HCAP/aspiration pneumonia: Remains ventilator dependent via tracheostomy-On systemic corticosteroids and narrowed antibiotic spectrum now to Fortaz monotherapy. Previously on antifungal coverage as well. 3. Septic shock: Resolved and now a narrowed antibiotic coverage therapy. 4. Altered mental status: Suspected multifactorial including from uremia for which dialysis is being provided. With baseline of autism.  Subjective:   No acute events overnight-hemodialysis done uneventfully yesterday.    Objective:   BP 131/73 mmHg  Pulse 106  Temp(Src) 98 F (36.7 C) (Core (Comment))  Resp 23  Ht 5\' 9"  (1.753 m)  Wt 107.5 kg (236 lb 15.9 oz)  BMI 34.98 kg/m2  SpO2 98%  Intake/Output Summary (Last 24 hours) at 03/31/15 0809 Last data filed at 03/31/15 0700  Gross per 24 hour  Intake 1996.25 ml  Output   3038 ml  Net -1041.75 ml   Weight change: 0.7 kg (1 lb 8.7 oz)  Physical Exam: Gen: On ventilator via tracheostomy, sedated CVS: Pulse regular tachycardia, S1 and S2 normal Resp: Coarse bilaterally-no distinct rales or rhonchi Abd: Soft, obese, nontender Ext: 1-2+ lower extremity edema  Imaging: Dg Chest Port 1 View  03/31/2015  CLINICAL DATA:  Respiratory failure.  Short of breath. EXAM: PORTABLE  CHEST 1 VIEW COMPARISON:  03/30/2015 FINDINGS: Tracheostomy remains in good position. Right jugular central venous catheter tip in the SVC unchanged. NG tube enters the stomach. Progression of bibasilar airspace disease. This may represent pneumonia or edema. No significant effusion. IMPRESSION: Progression of bibasilar infiltrate. Electronically Signed   By: Marlan Palauharles  Clark M.D.   On: 03/31/2015 07:20   Dg Chest Port 1 View  03/30/2015  CLINICAL DATA:  Respiratory failure EXAM: PORTABLE CHEST 1 VIEW COMPARISON:  Chest radiograph from one day prior. FINDINGS: Tracheostomy tube tip overlies the tracheal air column at the thoracic inlet. Right internal jugular central venous catheter terminates in the middle third of the superior vena cava. Stable cardiomediastinal silhouette with normal heart size. No pneumothorax. No pleural effusion. Mild hazy opacities throughout both lungs, slightly improved. IMPRESSION: 1. Well-positioned support hardware as described. 2. Mild hazy opacities throughout both lungs, slightly improved, consistent with improving noncardiogenic pulmonary edema/ARDS. Electronically Signed   By: Delbert PhenixJason A Poff M.D.   On: 03/30/2015 08:46    Labs: BMET  Recent Labs Lab 03/26/15 0715 03/27/15 0501 03/28/15 0400 03/29/15 0510 03/30/15 0425 03/30/15 1740 03/31/15 0423  NA 144 148* 143 140 138 136 137  K 5.4* 5.2* 4.6 5.4* 5.9* 5.0 5.0  CL 98* 98* 98* 96* 93* 100* 96*  CO2 24 24 25 25 25 26 25   GLUCOSE 169* 174* 200* 180* 167* 298* 197*  BUN 153* 210* 165* 167* 140* 43* 124*  CREATININE 7.51* 8.88* 7.11* 7.02* 5.22* 1.33* 4.06*  CALCIUM 8.0* 8.0* 8.0* 8.1* 8.3* 9.1 8.1*  PHOS  --  11.6*  --   --  9.9* 5.4*  --  CBC  Recent Labs Lab 03/25/15 0400 03/26/15 0715 03/28/15 0400 03/29/15 0510 03/30/15 0425 03/31/15 0423  WBC 13.1* 19.6* 18.2* 19.4* 33.9* 41.9*  NEUTROABS 9.9* 17.4* 15.7* 16.2*  --   --   HGB 8.9* 8.8* 7.2* 7.4* 8.0* 8.0*  HCT 27.6* 26.4* 22.1* 22.0* 24.1*  24.1*  MCV 80.9 81.7 80.4 80.3 79.3 80.1  PLT 158 188 153 177 219 214    Medications:    . antiseptic oral rinse  7 mL Mouth Rinse QID  . cefTAZidime (FORTAZ)  IV  1 g Intravenous Q24H  . chlorhexidine gluconate  15 mL Mouth Rinse BID  . docusate  100 mg Per Tube Daily  . feeding supplement (PRO-STAT SUGAR FREE 64)  30 mL Per Tube BID  . feeding supplement (VITAL HIGH PROTEIN)  1,000 mL Per Tube Q24H  . heparin subcutaneous  5,000 Units Subcutaneous 3 times per day  . insulin aspart  2-6 Units Subcutaneous 6 times per day  . ipratropium-albuterol  3 mL Nebulization Q4H  . methylPREDNISolone (SOLU-MEDROL) injection  80 mg Intravenous 3 times per day  . pantoprazole sodium  40 mg Per Tube Q1200  . polyethylene glycol  17 g Oral Daily  . sodium chloride flush  3 mL Intravenous Q12H   Zetta Bills, MD 03/31/2015, 8:09 AM

## 2015-03-31 NOTE — Progress Notes (Signed)
Dialysis treatment completed.  1500 mL ultrafiltrated.  1000 mL net fluid removal.  Patient status unchanged. Lung sounds diminished to ausculation in all fields. Generalized edema. Cardiac: Tachycardic.  Cleansed RIJ catheter with chlorhexidine.  Disconnected lines and flushed ports with saline per protocol.  Ports locked with heparin and capped per protocol.    Report given to bedside, RN Trinna PostAlex.

## 2015-04-01 ENCOUNTER — Inpatient Hospital Stay (HOSPITAL_COMMUNITY): Payer: Medicare Other

## 2015-04-01 LAB — CBC WITH DIFFERENTIAL/PLATELET
BASOS PCT: 1 %
Basophils Absolute: 0.5 10*3/uL — ABNORMAL HIGH (ref 0.0–0.1)
Eosinophils Absolute: 0 10*3/uL (ref 0.0–0.7)
Eosinophils Relative: 0 %
HCT: 25 % — ABNORMAL LOW (ref 39.0–52.0)
HEMOGLOBIN: 8.3 g/dL — AB (ref 13.0–17.0)
LYMPHS PCT: 6 %
Lymphs Abs: 3 10*3/uL (ref 0.7–4.0)
MCH: 26.3 pg (ref 26.0–34.0)
MCHC: 33.2 g/dL (ref 30.0–36.0)
MCV: 79.1 fL (ref 78.0–100.0)
MONOS PCT: 3 %
Monocytes Absolute: 1.5 10*3/uL — ABNORMAL HIGH (ref 0.1–1.0)
NEUTROS ABS: 45.5 10*3/uL — AB (ref 1.7–7.7)
NEUTROS PCT: 90 %
PLATELETS: 236 10*3/uL (ref 150–400)
RBC: 3.16 MIL/uL — ABNORMAL LOW (ref 4.22–5.81)
RDW: 14.9 % (ref 11.5–15.5)
WBC: 50.5 10*3/uL (ref 4.0–10.5)

## 2015-04-01 LAB — BASIC METABOLIC PANEL
ANION GAP: 15 (ref 5–15)
BUN: 115 mg/dL — ABNORMAL HIGH (ref 6–20)
CHLORIDE: 97 mmol/L — AB (ref 101–111)
CO2: 25 mmol/L (ref 22–32)
Calcium: 8.1 mg/dL — ABNORMAL LOW (ref 8.9–10.3)
Creatinine, Ser: 3.32 mg/dL — ABNORMAL HIGH (ref 0.61–1.24)
GFR calc non Af Amer: 22 mL/min — ABNORMAL LOW (ref >60)
GFR, EST AFRICAN AMERICAN: 26 mL/min — AB (ref >60)
GLUCOSE: 121 mg/dL — AB (ref 65–99)
POTASSIUM: 4.7 mmol/L (ref 3.5–5.1)
Sodium: 137 mmol/L (ref 135–145)

## 2015-04-01 LAB — GLUCOSE, CAPILLARY
GLUCOSE-CAPILLARY: 138 mg/dL — AB (ref 65–99)
GLUCOSE-CAPILLARY: 151 mg/dL — AB (ref 65–99)
GLUCOSE-CAPILLARY: 152 mg/dL — AB (ref 65–99)
GLUCOSE-CAPILLARY: 161 mg/dL — AB (ref 65–99)
Glucose-Capillary: 124 mg/dL — ABNORMAL HIGH (ref 65–99)
Glucose-Capillary: 116 mg/dL — ABNORMAL HIGH (ref 65–99)

## 2015-04-01 MED ORDER — MIDAZOLAM BOLUS VIA INFUSION
1.0000 mg | INTRAVENOUS | Status: DC | PRN
  Filled 2015-04-01: qty 2

## 2015-04-01 MED ORDER — HEPARIN 1000 UNIT/ML FOR PERITONEAL DIALYSIS
2500.0000 [IU] | INTRAMUSCULAR | Status: DC | PRN
  Administered 2015-04-01: 2500 [IU] via INTRAPERITONEAL
  Filled 2015-04-01 (×2): qty 2.5

## 2015-04-01 MED ORDER — HEPARIN SODIUM (PORCINE) 1000 UNIT/ML DIALYSIS
5000.0000 [IU] | INTRAMUSCULAR | Status: DC | PRN
  Filled 2015-04-01: qty 5

## 2015-04-01 MED ORDER — VANCOMYCIN HCL 10 G IV SOLR
2000.0000 mg | Freq: Once | INTRAVENOUS | Status: AC
  Administered 2015-04-01: 2000 mg via INTRAVENOUS
  Filled 2015-04-01: qty 2000

## 2015-04-01 MED ORDER — VANCOMYCIN HCL IN DEXTROSE 1-5 GM/200ML-% IV SOLN
1000.0000 mg | INTRAVENOUS | Status: DC
  Administered 2015-04-02 – 2015-04-03 (×3): 1000 mg via INTRAVENOUS
  Filled 2015-04-01 (×4): qty 200

## 2015-04-01 MED ORDER — VANCOMYCIN HCL IN DEXTROSE 1-5 GM/200ML-% IV SOLN
1000.0000 mg | INTRAVENOUS | Status: DC
  Filled 2015-04-01: qty 200

## 2015-04-01 MED ORDER — SODIUM CHLORIDE 0.9 % IV BOLUS (SEPSIS)
500.0000 mL | Freq: Once | INTRAVENOUS | Status: AC
  Administered 2015-04-01: 500 mL via INTRAVENOUS

## 2015-04-01 MED ORDER — SODIUM CHLORIDE 0.9 % IV SOLN
500.0000 mg | INTRAVENOUS | Status: AC
  Administered 2015-04-01 – 2015-04-07 (×7): 500 mg via INTRAVENOUS
  Filled 2015-04-01 (×7): qty 0.5

## 2015-04-01 NOTE — Progress Notes (Addendum)
Patient ID: Juan RipperRobert Urquilla, male   DOB: May 07, 1975, 40 y.o.   MRN: [id number]  [facility] Progress Note    Assessment/ Plan:   1. AKI- Now anuric: Status post hemodialysis yesterday with some improvement of BUN/creatinine- given his hypercatabolic state, the plan is to do daily dialysis until the end of the week and reassess him. Unfortunately, without any evidence of renal recovery and with indications of chronic dialysis needs (that is undesired in his goals of care per prior discussions with family). I will attempt to meet with his sister [facility](HCPOA) later today to update her. 2. Acute hypoxic respiratory failure/ARDS/HCAP/aspiration pneumonia: Remains ventilator dependent via tracheostomy-On systemic corticosteroids and narrowed antibiotic spectrum now to Fortaz monotherapy. Previously on antifungal coverage as well. 3. Septic shock: Resolved and now on a narrowed antibiotic coverage therapy. Rising leukocyte count noted (? demargination effect of Solu-Medrol rather than infection) 4. Altered mental status: Suspected multifactorial including from uremia for which dialysis is being provided. With baseline of autism.  Subjective:   No acute events overnight-hemodialysis done uneventfully yesterday. No acute changes overnight. Rising leukocyte count without focal signs of infection.    Objective:   BP 139/66 mmHg  Pulse 120  Temp(Src) 98.5 F (36.9 C) (Oral)  Resp 25  Ht 5\' 9"  (1.753 m)  Wt 109.2 kg (240 lb 11.9 oz)  BMI 35.54 kg/m2  SpO2 97%  Intake/Output Summary (Last 24 hours) at 04/01/15 0810 Last data filed at 04/01/15 0700  Gross per 24 hour  Intake   1945 ml  Output   1005 ml  Net    940 ml   Weight change: -0.8 kg (-1 lb 12.2 oz)  Physical Exam: Gen: On ventilator via tracheostomy, sedated CVS: Pulse regular tachycardia, S1 and S2 normal Resp: Coarse bilaterally-no distinct rales or rhonchi Abd: Soft, obese, nontender Ext: 2+ lower extremity  edema  Imaging: Dg Chest Port 1 View  04/01/2015  CLINICAL DATA:  Respiratory failure. EXAM: PORTABLE CHEST 1 VIEW COMPARISON:  03/31/2015. FINDINGS: Tracheostomy tube, NG tube, right IJ line stable position. Heart size stable. Progressive left lower lobe infiltrate noted most consistent pneumonia. Mild right base subsegmental atelectasis and or infiltrate noted. No pleural effusion or pneumothorax. IMPRESSION: 1. Lines and tubes in stable position. 2. Progressive left lower lobe infiltrate suggesting pneumonia. Mild right base subsegmental atelectasis and or infiltrate also noted . Electronically Signed   By: Maisie Fushomas  Register   On: 04/01/2015 07:39   Dg Chest Port 1 View  03/31/2015  CLINICAL DATA:  Respiratory failure.  Short of breath. EXAM: PORTABLE CHEST 1 VIEW COMPARISON:  03/30/2015 FINDINGS: Tracheostomy remains in good position. Right jugular central venous catheter tip in the SVC unchanged. NG tube enters the stomach. Progression of bibasilar airspace disease. This may represent pneumonia or edema. No significant effusion. IMPRESSION: Progression of bibasilar infiltrate. Electronically Signed   By: Marlan Palauharles  Clark M.D.   On: 03/31/2015 07:20    Labs: BMET  Recent Labs Lab 03/27/15 0501 03/28/15 0400 03/29/15 0510 03/30/15 0425 03/30/15 1740 03/31/15 0423 04/01/15 0551  NA 148* 143 140 138 136 137 137  K 5.2* 4.6 5.4* 5.9* 5.0 5.0 4.7  CL 98* 98* 96* 93* 100* 96* 97*  CO2 24 25 25 25 26 25 25   GLUCOSE 174* 200* 180* 167* 298* 197* 121*  BUN 210* 165* 167* 140* 43* 124* 115*  CREATININE 8.88* 7.11* 7.02* 5.22* 1.33* 4.06* 3.32*  CALCIUM 8.0* 8.0* 8.1* 8.3* 9.1 8.1* 8.1*  PHOS 11.6*  --   --  9.9* 5.4*  --   --    CBC  Recent Labs Lab 03/26/15 0715 03/28/15 0400 03/29/15 0510 03/30/15 0425 03/31/15 0423 04/01/15 0551  WBC 19.6* 18.2* 19.4* 33.9* 41.9* 50.5*  NEUTROABS 17.4* 15.7* 16.2*  --   --  45.5*  HGB 8.8* 7.2* 7.4* 8.0* 8.0* 8.3*  HCT 26.4* 22.1* 22.0* 24.1*  24.1* 25.0*  MCV 81.7 80.4 80.3 79.3 80.1 79.1  PLT 188 153 177 219 214 236    Medications:    . antiseptic oral rinse  7 mL Mouth Rinse QID  . chlorhexidine gluconate  15 mL Mouth Rinse BID  . docusate  100 mg Per Tube Daily  . feeding supplement (PRO-STAT SUGAR FREE 64)  30 mL Per Tube BID  . feeding supplement (VITAL HIGH PROTEIN)  1,000 mL Per Tube Q24H  . heparin subcutaneous  5,000 Units Subcutaneous 3 times per day  . insulin aspart  2-6 Units Subcutaneous 6 times per day  . ipratropium-albuterol  3 mL Nebulization Q4H  . methylPREDNISolone (SOLU-MEDROL) injection  40 mg Intravenous Q12H  . pantoprazole sodium  40 mg Per Tube Q1200  . sodium chloride flush  3 mL Intravenous Q12H   Zetta Bills, MD 04/01/2015, 8:10 AM

## 2015-04-01 NOTE — Progress Notes (Signed)
Pharmacy Antibiotic Note  Juan Cameron is a 40 y.o. male admitted on 03/11/2015.  Pharmacy has been consulted for meropenem and vancomycin dosing for HCAP coverage. Yesterday 03/31/15 he completed 7 days ceftaz for PNA.   To start merrem/vanc for HCAP.   T max 102.7  WBC 41.9 >>50.5  (on solumedrol- to end today at 1730).  Getting daily HD - today is day #6/10 of HD trial. I called hemo and his HD for today will start within the hour. CXR: worse LLL ifiltrate.  Wt 109.2 kg  Plan: Meropenem 500 mg IV q24 - give after HD on HD days Vancomycin 2 gm loading dose after HD today, then vancomycin 1 gm after each HD. F/u HD plans  Height: 5\' 9"  (175.3 cm) Weight: 240 lb 11.9 oz (109.2 kg) IBW/kg (Calculated) : 70.7  Temp (24hrs), Avg:99.6 F (37.6 C), Min:98.2 F (36.8 C), Max:102.7 F (39.3 C)   Recent Labs Lab 03/27/15 0501 03/28/15 0400 03/29/15 0510 03/30/15 0425 03/30/15 1740 03/31/15 0423 04/01/15 0551  WBC  --  18.2* 19.4* 33.9*  --  41.9* 50.5*  CREATININE 8.88* 7.11* 7.02* 5.22* 1.33* 4.06* 3.32*  VANCORANDOM 39  --   --   --   --   --   --     Estimated Creatinine Clearance: 37.1 mL/min (by C-G formula based on Cr of 3.32).    Allergies  Allergen Reactions  . Peanuts [Peanut Oil] Anaphylaxis    Antimicrobials this admission: Cefepime 2/28 >> 3/7,  3/9 >> 3/11 Vanc 2/28 >> 3/4; 3/5 >> 3/11; 3/14>>3/16  3/21>> Anidulifungin 3/14>>3/16 DCd by Dr Luciana Axeomer Ceftazadime 3/14>>3/20 Meropenem 3/21>>  Dose adjustments this admission: 3/1 VT = 27 on 1g q8  3/8 VT 32 on Vanc 1 gm Q 12 hours - reduce to Vanc 750 mg IV Q 24h  3/16 VR = 39 (Last dose 3/14)   Microbiology results: 2/28 blood cx, urine cx, RVP, Flu PCR, strep UA, Legionella, MRSA PCR - negative 3/4 BCx x2 - NGF 3/4 resp cx: mod yeast 3/5 resp cx: mod yeast 3/9 resp cx: few yeast 3/10 Blood - NGF 3/10 Urine - NGF 3/21 TA>>   Thank you for allowing pharmacy to be a part of this patient's care.  Herby AbrahamMichelle T.  Siddhi Dornbush, Pharm.D. [phone] 04/01/2015 1:14 PM

## 2015-04-01 NOTE — Progress Notes (Signed)
[facility] P[facility]M  Worsening tachycardia, hypoxemia Back to full support Bolus saline Start HCAP coverage> vanc, meropenem  Heber CarolinaBrent McQuaid, MD [place] [facility] Pager: [phone] Cell: [phone] After 3pm or if no response, call [phone]

## 2015-04-01 NOTE — Progress Notes (Signed)
Dialysis treatment completed.  2500 mL ultrafiltrated.  1500 mL net fluid removal.  Patient status unchanged. Lung sounds diminished to ausculation in all fields. Generalized edema. Cardiac: regular R&R.  Cleansed RIj catheter with chlorhexidine.  Disconnected lines and flushed ports with saline per protocol.  Ports locked with heparin and capped per protocol.    Report given to bedside, RN Gordy CouncilmanAlexandra.

## 2015-04-01 NOTE — Progress Notes (Signed)
Nutrition Follow-up  DOCUMENTATION CODES:   Obesity unspecified  INTERVENTION:  -ContinueVital High Protein via NGT at 50 ml/h (1200 ml/day) with Prostat 30 ml BID to provide 1400 kcals, 135 gm protein, 1003 ml free water daily   NUTRITION DIAGNOSIS:   Inadequate oral intake related to inability to eat as evidenced by NPO status.  -Ongoing  GOAL:   Provide needs based on ASPEN/SCCM guidelines  -Continue  MONITOR:   Vent status, Labs, Weight trends, Skin, I & O's  ASSESSMENT:   40 yo autistic with limited vocabulary, recently admitted 2/17-2/27 for severe CAP.  CT scan negative for pulmonary embolism and showing bilateral lower lobe consolidation with patchy upper lobe infiltrates, rapid flu test was negative as well as urine strep antigen.  Echo showed diastolic dysfunction.  Pt had difficulty getting oxygen upon return home, his sister- POA, noted increasing hypoxia and brought him back to hospital.  3/1-Intubated 3/1-NG tube placed  3/16-HD started   MV: 14.4 L/min Temp: 37.8 C Propofol: None  Spoke with RN, she reports pt is tolerating well  Pt currently receiving Vital High Protein via NGT at 50 ml/h, will continue regimen.  Medications reviewed. Labs reviewed.  Diet Order:  Diet NPO time specified  Skin:  Wound (see comment) (Stage II Pressure Injury on neck )  Last BM:  03/31/2015  Height:   Ht Readings from Last 1 Encounters:  03/25/15 5\' 9"  (1.753 m)    Weight:   Wt Readings from Last 1 Encounters:  03/31/15 240 lb 11.9 oz (109.2 kg)    Ideal Body Weight:  67.3 kg  BMI:  Body mass index is 35.54 kg/(m^2).  Estimated Nutritional Needs:   Kcal:  [id number]  Protein:  >/= 135 grams  Fluid:  1.5 L  EDUCATION NEEDS:   No education needs identified at this time  Brynda RimBrittany Nico Syme, Dietetic Intern Pager: [phone]

## 2015-04-01 NOTE — Progress Notes (Signed)
PULMONARY / CRITICAL CARE MEDICINE   Name: Juan Cameron MRN: [id number] DOB: 1975/12/21    ADMISSION DATE:  03/11/2015  CONSULTATION DATE: 2/28  REFERRING MD: [facility]  CHIEF COMPLAINT: Acute resp failure  BRIEF:  75 autistic male recently in hospital for multilobar pneumonia and dc'd on 6 l [place] and returned in 24 hours with sats in 50%. He was discharged on 03/10/15 after treatment for CAP, NOI, and was on 6 l [place]. Sister [facility]) checked sats and he was 50% and therefore was transported back to [facility] and required 80% fio2 on NIVMS with RR 45. He developed ARDS adn was intubated  SUBJECTIVE:  Oxygenation still improving Plan daily hemodialysis   VITAL SIGNS: BP 142/76 mmHg  Pulse 122  Temp(Src) 100.1 F (37.8 C) (Axillary)  Resp 28  Ht  (1.753 m)  Wt 109.2 kg (240 lb 11.9 oz)  BMI 35.54 kg/m2  SpO2 96%  HEMODYNAMICS:    VENTILATOR SETTINGS: Vent Mode:  [-] PRVC FiO2 (%):  [40 %-50 %] 40 % Set Rate:  [24 bmp] 24 bmp Vt Set:  [40 mL-400 mL] 400 mL PEEP:  [5 cmH20-8 cmH20] 5 cmH20 Plateau Pressure:  [6 cmH20-23 cmH20] 22 cmH20  INTAKE / OUTPUT: I/O last 3 completed shifts: In: 3045 [I.V.:1105; NG/GT:1940] Out: 1043 [Urine:43; Other:1000]  PHYSICAL EXAMINATION: General: sedated heavily on vent HEENT: trach site c/d/i, NG in place PULM: few crackles, vent supported breaths CV: RRR, no mgr GI: Belly soft, nontender MSK: normal bulk and tone Derm: no rash or skin breakdown Neuro: sedated heavily on vent  LABS:  BMET  Recent Labs Lab 03/30/15 1740 03/31/15 0423 04/01/15 0551  NA 136 137 137  K 5.0 5.0 4.7  CL 100* 96* 97*  CO2 BUN 43* 124* 115*  CREATININE 1.33* 4.06* 3.32*  GLUCOSE 298* 197* 121*   Electrolytes  Recent Labs Lab 03/27/15 0501  03/30/15 0425 03/30/15 1740 03/31/15 0423 04/01/15 0551  CALCIUM 8.0*  < > 8.3* 9.1 8.1* 8.1*  MG 3.5*  --  2.7*  --   --   --   PHOS 11.6*  --  9.9* 5.4*  --   --   < > = values in  this interval not displayed. CBC  Recent Labs Lab 03/30/15 0425 03/31/15 0423 04/01/15 0551  WBC 33.9* 41.9* 50.5*  HGB 8.0* 8.0* 8.3*  HCT 24.1* 24.1* 25.0*  PLT 219 214 236   Coag's No results for input(s): APTT, INR in the last 168 hours. Sepsis Markers No results for input(s): LATICACIDVEN, PROCALCITON, O2SATVEN in the last 168 hours. ABG  Recent Labs Lab 03/29/15 0403 03/31/15 0431 03/31/15 1447  PHART 7.208* 7.346* 7.483*  PCO2ART 63.3* 56.1* 43.3  PO2ART 80.0 114.0* 110.0*   Liver Enzymes  Recent Labs Lab 03/26/15 0715 03/28/15 0400 03/29/15 0510 03/30/15 1740  AST 54* 45* 193*  --   ALT 35 38 53  --   ALKPHOS 110 104 89  --   BILITOT 2.1* 0.7 0.8  --   ALBUMIN 2.2* 2.3* 2.3* 1.7*    Cardiac Enzymes No results for input(s): TROPONINI, PROBNP in the last 168 hours. Glucose  Recent Labs Lab 03/31/15 1254 03/31/15 1553 03/31/15 2044 03/31/15 2356 04/01/15 0359 04/01/15 0852  GLUCAP 142* 147* 149* 161* 124* 116*   Imaging  3/21 CXR images personally reviewed> persistent bilateral airspace disease improved compared to last week's studies  CULTURES: 2/28 bc x 2 negative 2/28 uc negative 2/28 RVP negative  Sputum culture 3/4, 3/5, 3/9 >>yeast Blood culture 3/4 >>  Blood 3/10 >  Resp culture 3/9 >  Urine 3/10 >   Resp culture 3/21 >   ANTIBIOTICS: 2/28 vanc>>3/10 2/28 cefepime>>3/10 3/14 ceftaz>>>plan stop 3/21 3/14 vanc>>>3/16 3/14 anidulo>>>plan stop 21  SIGNIFICANT EVENTS: 2/28 readmit to hospital  03/12/15 Intubated 3/1 Bronchoscopy  3/14- unable to ventilate or oxygenate> DNR 3/15- pos 6.8 liters 3/16- HD started  LINES/TUBES: Central line L IJ TLC>3/1 Trach 3/9>>> Rt ij hd 3/16>>>  ASSESSMENT / PLAN:  PULMONARY A: ARDS from severe CAP vs HCAP;  ?Infiltrate left lung, ?HCAP but improving respiratory condition overall argues against this Suspected undiagnosed OSA Pulm edema > improving rul ATX P:  Decrease  FiO2, PEEP for goal PaO2 55-65 Decrease RR to 14 Pressure support today Continue volume removal with HD VAP prevention Wean off steroids, last dose today  CARDIOVASCULAR A:  Septic Shock -- resolved P:  Tele  RENAL A:  Acute Kidney Injury, ARF Hyperkalmia P:  HD pre renal  GASTROINTESTINAL A:  Constipation on high dose narcotics, improved P:  Cont TF Bowel regimen  HEMATOLOGIC A:  Hx of sickle cell trait VTE  P:  Continue SQ heparin  INFECTIOUS A:  Recurrent PNA with ARDS Leukocytosis/fever consistent with late phase ARDS and steroid effect Fever this am. Cultures (-) so far.  HCAP? > doubt given clinical improvment P:  Stop ceftaz, monitor clinically Culture mucus given left lung infiltrate Wean off steroids    ENDOCRINE  A:  Hyperglycemia  P:  Sliding scale insulin  NEUROLOGIC A:  Mentally impaired with limited vocabulary of 500 words Autism  Acute encephalopathy from metabolic disarray P:  RASS goal: -1 Versed, fentanyl Wean solumedrol this week severe debiliation  Sister updated bedside 3/20; god-mother updated 3/21 bedside  I expressed my concern for his overall prognosis to his godmother who agrees.  At this point we will plan 10 days of dialysis total.  He is not a good candidate for LTAC or a facility where he could receive ventilator and hemodialysis care.  His godmother Radiographer, therapeutic(nurse practioner) says there is "no way" he could participate in rehab.  If he has renal and respiratory recovery by day 10 dialysis (5 days from now) then we could consider local rehab facilities, but I will not sent him to an LTAC.  My cc time 41 minutes  Heber CarolinaBrent McQuaid, MD [place] [facility]M Pager: [phone] Cell: [phone] After 3pm or if no response, call [phone]  04/01/2015, 9:56 AM

## 2015-04-01 NOTE — Progress Notes (Signed)
CRITICAL VALUE ALERT  Critical value received:  White blood cells 50.5  Date of notification:  04/01/15  Time of notification:  7:15  Critical value read back:Yes.    Nurse who received alert:  Arnoldo LenisAlex Issis Lindseth RN  MD notified (1st page):  Dr. Kendrick FriesMcquaid  Time of first page:  7:28  Responding MD:  Dr. Kendrick FriesMcquaid  Time MD responded:  7:28

## 2015-04-01 NOTE — Progress Notes (Signed)
RT note-post turning, increased HR, and sp02 low, suctioned and increased fio2 60%.

## 2015-04-01 NOTE — Progress Notes (Signed)
Arrived to patient room 71M-15 at 1349.  Reviewed treatment plan and this RN agrees with plan.  Report received from bedside RN, Gordy CouncilmanAlexandra.  Consent verified.  Patient non responsive, trached and vent.   Lung sounds diminished to ausculation in all fields. Generalized edema. Cardiac:  Tachycardic, sustaining in 130's.  Removed caps and cleansed RIJ catheter with chlorhedxidine.  Aspirated ports of heparin and flushed them with saline per protocol.  Connected and secured lines, initiated treatment at 1400.  UF Goal of 3000mL and net fluid removal 2.5L.  Pt temperature upon start of treatment is 103.5.  Primary care aware.    Will continue to monitor.

## 2015-04-01 NOTE — Progress Notes (Signed)
Per Nephrology, "the plan is to do daily dialysis until the end of the week and reassess him. Unfortunately, without any evidence of renal recovery and with indications of chronic dialysis needs (that is undesired in his goals of care per prior discussions with family)." Dr. Allena KatzPatel will attempt to meet with his sister (HCPOA) later today to update her. Patient remains ventilator dependent via tracheostomy. CSW continues to follow for disposition.   Juan GensAshley Gardner, MSW, LCSW [facility] Clinical Social Worker [phone]

## 2015-04-01 NOTE — Progress Notes (Signed)
Large venous chamber clot presented, requiring HD machine to be relined.  Physician notified and telephone order received to administer heparin.  Will continue to monitor.

## 2015-04-02 ENCOUNTER — Inpatient Hospital Stay (HOSPITAL_COMMUNITY): Payer: Medicare Other

## 2015-04-02 LAB — CBC WITH DIFFERENTIAL/PLATELET
BASOS PCT: 0 %
BLASTS: 0 %
Band Neutrophils: 4 %
Basophils Absolute: 0 10*3/uL (ref 0.0–0.1)
Eosinophils Absolute: 0 10*3/uL (ref 0.0–0.7)
Eosinophils Relative: 0 %
HEMATOCRIT: 24.5 % — AB (ref 39.0–52.0)
Hemoglobin: 8.2 g/dL — ABNORMAL LOW (ref 13.0–17.0)
LYMPHS PCT: 8 %
Lymphs Abs: 4 10*3/uL (ref 0.7–4.0)
MCH: 26.9 pg (ref 26.0–34.0)
MCHC: 33.5 g/dL (ref 30.0–36.0)
MCV: 80.3 fL (ref 78.0–100.0)
MONOS PCT: 4 %
Metamyelocytes Relative: 5 %
Monocytes Absolute: 2 10*3/uL — ABNORMAL HIGH (ref 0.1–1.0)
Myelocytes: 4 %
NEUTROS ABS: 44.3 10*3/uL — AB (ref 1.7–7.7)
NEUTROS PCT: 72 %
NRBC: 0 /100{WBCs}
OTHER: 0 %
PROMYELOCYTES ABS: 3 %
Platelets: 224 10*3/uL (ref 150–400)
RBC: 3.05 MIL/uL — ABNORMAL LOW (ref 4.22–5.81)
RDW: 15.3 % (ref 11.5–15.5)
WBC: 50.3 10*3/uL (ref 4.0–10.5)

## 2015-04-02 LAB — GLUCOSE, CAPILLARY
GLUCOSE-CAPILLARY: 123 mg/dL — AB (ref 65–99)
GLUCOSE-CAPILLARY: 127 mg/dL — AB (ref 65–99)
GLUCOSE-CAPILLARY: 114 mg/dL — AB (ref 65–99)
Glucose-Capillary: 160 mg/dL — ABNORMAL HIGH (ref 65–99)
Glucose-Capillary: 122 mg/dL — ABNORMAL HIGH (ref 65–99)
Glucose-Capillary: 115 mg/dL — ABNORMAL HIGH (ref 65–99)

## 2015-04-02 LAB — POCT I-STAT 3, ART BLOOD GAS (G3+)
Acid-Base Excess: 5 mmol/L — ABNORMAL HIGH (ref 0.0–2.0)
BICARBONATE: 33.8 meq/L — AB (ref 20.0–24.0)
O2 Saturation: 98 %
PCO2 ART: 78.2 mmHg — AB (ref 35.0–45.0)
PH ART: 7.247 — AB (ref 7.350–7.450)
PO2 ART: 133 mmHg — AB (ref 80.0–100.0)
Patient temperature: 99.7
TCO2: 36 mmol/L (ref 0–100)

## 2015-04-02 LAB — BASIC METABOLIC PANEL
ANION GAP: 11 (ref 5–15)
BUN: 77 mg/dL — ABNORMAL HIGH (ref 6–20)
CO2: 26 mmol/L (ref 22–32)
Calcium: 7.9 mg/dL — ABNORMAL LOW (ref 8.9–10.3)
Chloride: 95 mmol/L — ABNORMAL LOW (ref 101–111)
Creatinine, Ser: 2.34 mg/dL — ABNORMAL HIGH (ref 0.61–1.24)
GFR, EST AFRICAN AMERICAN: 39 mL/min — AB (ref >60)
GFR, EST NON AFRICAN AMERICAN: 34 mL/min — AB (ref >60)
GLUCOSE: 126 mg/dL — AB (ref 65–99)
POTASSIUM: 4.7 mmol/L (ref 3.5–5.1)
Sodium: 132 mmol/L — ABNORMAL LOW (ref 135–145)

## 2015-04-02 MED ORDER — HEPARIN SODIUM (PORCINE) 1000 UNIT/ML IJ SOLN
5000.0000 [IU] | Freq: Once | INTRAMUSCULAR | Status: AC
  Administered 2015-04-02: 5000 [IU] via INTRAVENOUS
  Filled 2015-04-02: qty 5

## 2015-04-02 NOTE — Progress Notes (Signed)
Patient ID: Juan Cameron, male   DOB: 1975/12/03, 40 y.o.   MRN: [id number]  [facility] Progress Note    Assessment/ Plan:   1. AKI- Now anuric: Suspected ATN from sepsis, remains anuric with the plan for daily dialysis to try and confer him some clearance to see if it makes a positive impact on mental status/overall prognosis. Per previous discussions (which I plan to discuss again with sister when she is available), will continue dialysis until the end of this week and then reassess goals of care. Ultrafiltration appears to be limited by hypotension. 2. Acute hypoxic respiratory failure/ARDS/HCAP/aspiration pneumonia: Remains ventilator dependent via tracheostomy. Remains on broad-spectrum antibiotic therapy with vancomycin/meropenem and corticosteroids. 3. Septic shock: Antimicrobial coverage expanded yesterday after noticing increasing leukocytosis-afebrile but tachycardic 4. Altered mental status: Suspected multifactorial including from uremia for which dialysis is being provided. With baseline of autism.  Subjective:   No acute events noted overnight-some problems with filter clotting during dialysis after holding heparin. Notes from interactions with patient's sister noted from last night.    Objective:   BP 122/51 mmHg  Pulse 117  Temp(Src) 99.4 F (37.4 C) (Oral)  Resp 20  Ht  (1.753 m)  Wt 105.5 kg (232 lb 9.4 oz)  BMI 34.33 kg/m2  SpO2 96%  Intake/Output Summary (Last 24 hours) at 04/02/15 4098 Last data filed at 04/02/15 0600  Gross per 24 hour  Intake 3015.07 ml  Output   1500 ml  Net 1515.07 ml   Weight change: -0.5 kg (-1 lb 1.6 oz)  Physical Exam: Gen: On ventilator via tracheostomy, sedated, getting hemodialysis CVS: Pulse regular tachycardia, S1 and S2 normal Resp: Coarse bilaterally-no distinct rales or rhonchi Abd: Soft, obese, nontender Ext: 2+ lower extremity edema  Imaging: Dg Chest Port 1 View  04/02/2015  CLINICAL DATA:   Respiratory failure. EXAM: PORTABLE CHEST 1 VIEW COMPARISON:  04/01/2015. FINDINGS: Tracheostomy tube, NG tube, right IJ line stable position. Mediastinum hilar structures are unremarkable. Heart size normal. Low lung volumes with bibasilar atelectasis and/or infiltrates, progressed from prior exam. No pleural effusion or pneumothorax. IMPRESSION: 1. Lines and tubes in stable position. 2. Low lung volumes with bibasilar atelectasis and/or infiltrates, progressed from prior exam. Electronically Signed   By: Maisie Fus  Register   On: 04/02/2015 07:20   Dg Chest Port 1 View  04/01/2015  CLINICAL DATA:  Respiratory failure. EXAM: PORTABLE CHEST 1 VIEW COMPARISON:  03/31/2015. FINDINGS: Tracheostomy tube, NG tube, right IJ line stable position. Heart size stable. Progressive left lower lobe infiltrate noted most consistent pneumonia. Mild right base subsegmental atelectasis and or infiltrate noted. No pleural effusion or pneumothorax. IMPRESSION: 1. Lines and tubes in stable position. 2. Progressive left lower lobe infiltrate suggesting pneumonia. Mild right base subsegmental atelectasis and or infiltrate also noted . Electronically Signed   ByMaisie Fus  Register   On: 04/01/2015 07:39    Labs: BMET  Recent Labs Lab 03/27/15 0501 03/28/15 0400 03/29/15 0510 03/30/15 0425 03/30/15 1740 03/31/15 0423 04/01/15 0551 04/02/15 0415  NA 148* 143 140 138 136 137 137 132*  K 5.2* 4.6 5.4* 5.9* 5.0 5.0 4.7 4.7  CL 98* 98* 96* 93* 100* 96* 97* 95*  CO2 GLUCOSE 174* 200* 180* 167* 298* 197* 121* 126*  BUN 210* 165* 167* 140* 43* 124* 115* 77*  CREATININE 8.88* 7.11* 7.02* 5.22* 1.33* 4.06* 3.32* 2.34*  CALCIUM 8.0* 8.0* 8.1* 8.3* 9.1 8.1* 8.1* 7.9*  PHOS 11.6*  --   --  9.9* 5.4*  --   --   --    CBC  Recent Labs Lab 03/28/15 0400 03/29/15 0510 03/30/15 0425 03/31/15 0423 04/01/15 0551 04/02/15 0415  WBC 18.2* 19.4* 33.9* 41.9* 50.5* PENDING  NEUTROABS 15.7* 16.2*  --   --   45.5* PENDING  HGB 7.2* 7.4* 8.0* 8.0* 8.3* 8.2*  HCT 22.1* 22.0* 24.1* 24.1* 25.0* 24.5*  MCV 80.4 80.3 79.3 80.1 79.1 80.3  PLT 153 177 219 214 236 PENDING    Medications:    . antiseptic oral rinse  7 mL Mouth Rinse QID  . chlorhexidine gluconate  15 mL Mouth Rinse BID  . docusate  100 mg Per Tube Daily  . feeding supplement (PRO-STAT SUGAR FREE 64)  30 mL Per Tube BID  . feeding supplement (VITAL HIGH PROTEIN)  1,000 mL Per Tube Q24H  . heparin subcutaneous  5,000 Units Subcutaneous 3 times per day  . insulin aspart  2-6 Units Subcutaneous 6 times per day  . ipratropium-albuterol  3 mL Nebulization Q4H  . meropenem (MERREM) IV  500 mg Intravenous Q24H  . pantoprazole sodium  40 mg Per Tube Q1200  . sodium chloride flush  3 mL Intravenous Q12H  . vancomycin  1,000 mg Intravenous Q24H   Zetta BillsJay Aashvi Rezabek, MD 04/02/2015, 8:06 AM

## 2015-04-02 NOTE — Progress Notes (Signed)
4:40 AM 04/02/2015  Patients sister expressed concerns of the use of IV fentanyl after investigating "fentanyl" using the internet, more so  the reviews of the drug by 'patients'. She states that she has these concerns since he has been on fentanyl for a while with no improvement in his respiratory status. Patient's sister informed that fentanyl is selected for its use in weaning since it is most likely to be cleared faster by the body. She also states that she has to have these concerns since "you all are the reason why he is here and in this situation in the first place".    Patient's sister at bedside requesting that patient not be bathed tonight in fear of his heart rate elevating even higher. Patient is afebrile and appears to be stable in no distress. Patient on 250 mcg of Fentanyl and 4 mg of Versed. Patient is synchronous with the ventilator and tolerates turning and position changes well. I reinforced that there would be assistance with bathing. Patient's sister still declined.   Will continue to provide comfort and update as needed.  Carlyon ProwsShakiera Brittnye Josephs RN

## 2015-04-02 NOTE — Progress Notes (Signed)
PULMONARY / CRITICAL CARE MEDICINE   Name: Juan Cameron MRN: [id number] DOB: [birth year]    ADMISSION DATE:  03/11/2015  CONSULTATION DATE: 2/28  REFERRING MD: Triad  CHIEF COMPLAINT: Acute resp failure  BRIEF:  66 autistic male recently in hospital for multilobar pneumonia and dc'd on 6 l [place] and returned in 24 hours with sats in 50%. He was discharged on 03/10/15 after treatment for CAP, NOI, and was on 6 l [place]. Sister [facility]) checked sats and he was 50% and therefore was transported back to [facility] and required 80% fio2 on NIVMS with RR 45. He developed ARDS adn was intubated  SUBJECTIVE:  Oxygenation worse Started on Vanc/meropenem yesterday   VITAL SIGNS: BP 116/51 mmHg  Pulse 113  Temp(Src) 99.4 F (37.4 C) (Rectal)  Resp 19  Ht  (1.753 m)  Wt 105.5 kg (232 lb 9.4 oz)  BMI 34.33 kg/m2  SpO2 96%  HEMODYNAMICS:    VENTILATOR SETTINGS: Vent Mode:  [-] PRVC FiO2 (%):  [40 %-60 %] 60 % Set Rate:  [14 bmp] 14 bmp Vt Set:  [400 mL] 400 mL PEEP:  [5 cmH20] 5 cmH20 Pressure Support:  [8 cmH20] 8 cmH20 Plateau Pressure:  [20 cmH20-24 cmH20] 20 cmH20  INTAKE / OUTPUT: I/O last 3 completed shifts: In: 4193.1 [I.V.:1173.1; Other:500; NG/GT:1970; IV Piggyback:550] Out: 1500 [Other:1500]  PHYSICAL EXAMINATION: General: sedated heavily on vent HEENT: trach site c/d/i, NG in place PULM: coarse breath sounds bilaterally, vent supported breaths CV: RRR, no mgr GI: Belly soft, nontender MSK: normal bulk and tone Derm: no rash or skin breakdown Neuro: sedated heavily on vent  LABS:  BMET  Recent Labs Lab 03/31/15 0423 04/01/15 0551 04/02/15 0415  NA 137 137 132*  K 5.0 4.7 4.7  CL 96* 97* 95*  CO2 BUN 124* 115* 77*  CREATININE 4.06* 3.32* 2.34*  GLUCOSE 197* 121* 126*   Electrolytes  Recent Labs Lab 03/27/15 0501  03/30/15 0425 03/30/15 1740 03/31/15 0423 04/01/15 0551 04/02/15 0415  CALCIUM 8.0*  < > 8.3* 9.1 8.1* 8.1* 7.9*  MG  3.5*  --  2.7*  --   --   --   --   PHOS 11.6*  --  9.9* 5.4*  --   --   --   < > = values in this interval not displayed. CBC  Recent Labs Lab 03/31/15 0423 04/01/15 0551 04/02/15 0415  WBC 41.9* 50.5* 50.3*  HGB 8.0* 8.3* 8.2*  HCT 24.1* 25.0* 24.5*  PLT 214 236 224   Coag's No results for input(s): APTT, INR in the last 168 hours. Sepsis Markers No results for input(s): LATICACIDVEN, PROCALCITON, O2SATVEN in the last 168 hours. ABG  Recent Labs Lab 03/29/15 0403 03/31/15 0431 03/31/15 1447  PHART 7.208* 7.346* 7.483*  PCO2ART 63.3* 56.1* 43.3  PO2ART 80.0 114.0* 110.0*   Liver Enzymes  Recent Labs Lab 03/28/15 0400 03/29/15 0510 03/30/15 1740  AST 45* 193*  --   ALT 38 53  --   ALKPHOS 104 89  --   BILITOT 0.7 0.8  --   ALBUMIN 2.3* 2.3* 1.7*    Cardiac Enzymes No results for input(s): TROPONINI, PROBNP in the last 168 hours. Glucose  Recent Labs Lab 04/01/15 1225 04/01/15 1601 04/01/15 1949 04/01/15 2317 04/02/15 0332 04/02/15 0841  GLUCAP 152* 151* 138* 160* 123* 122*   Imaging  3/22 CXR images personally reviewed> worsening airspace disease bilateral bases  CULTURES: 2/28 bc x 2 negative  2/28 uc negative 2/28 RVP negative Sputum culture 3/4, 3/5, 3/9 >>yeast Blood culture 3/4 >>  Blood 3/10 >  Resp culture 3/9 >  Urine 3/10 >   Resp culture 3/21 >   ANTIBIOTICS: 2/28 vanc>>3/10 2/28 cefepime>>3/10 3/14 ceftaz>>>plan stop 3/21 3/14 vanc>>>3/16 3/14 anidulo>>>plan stop 21  3/21 vanc >> 3/21 meropenem >>   SIGNIFICANT EVENTS: 2/28 readmit to hospital  03/12/15 Intubated 3/1 Bronchoscopy  3/14- unable to ventilate or oxygenate> DNR 3/15- pos 6.8 liters 3/16- HD started  LINES/TUBES: Central line L IJ TLC>3/1 Trach 3/9>>> Rt ij hd 3/16>>>  ASSESSMENT / PLAN:  PULMONARY A: ARDS from severe CAP vs HCAP New HCAP 3/21 Suspected undiagnosed OSA Pulm edema > improving P:  Adjust FiO2, PEEP for goal PaO2  55-65 Maintain RR 14 Continue volume removal with HD VAP prevention See ID  CARDIOVASCULAR A:  Septic Shock -- resolved P:  Tele  RENAL A:  Acute Kidney Injury, ARF Hyperkalmia P:  HD pre renal  GASTROINTESTINAL A:  Constipation on high dose narcotics, improved P:  Cont TF Bowel regimen  HEMATOLOGIC A:  Hx of sickle cell trait VTE  P:  Continue SQ heparin  INFECTIOUS A:  Recurrent PNA with ARDS HCAP 3/21 P:  Vanc/meropenem to continue until resp cultures back Follow resp culture    ENDOCRINE  A:  Hyperglycemia  P:  Sliding scale insulin  NEUROLOGIC A:  Mentally impaired with limited vocabulary of 500 words Autism  Acute encephalopathy from metabolic disarray P:  RASS goal: -1 Versed, fentanyl severe debiliation  God-mother and sister updated 3/21 in ICU; none bedside; plan family conversation 3/23  My cc time 41 minutes  Heber CarolinaBrent McQuaid, MD [place] [facility] Pager: [phone] Cell: [phone] After 3pm or if no response, call [phone]  04/02/2015, 9:39 AM

## 2015-04-02 NOTE — Procedures (Signed)
Patient seen on Hemodialysis. QB 400, UF goal 2.5L Treatment adjusted as needed.  Ayelen Sciortino MD [facility]. Office # [phone] Pager # [phone] 8:15 AM  

## 2015-04-02 NOTE — Progress Notes (Signed)
PCCM INTERVAL PROGRESS NOTE  Long discussion with sister HCPOA who wants to change patient back to full code. I described to her that extensively that if her brother were to suffer a cardiac arrest with the multiple organ failures he is currently experiencing he would almost certainly not survive, and if he did survive, this would be a setback he would be unable to recover from. She insisted that these are opinions and not facts. Would not discuss with me why she felt this change was necessary, just insisted that I made the change. Insisted he is a young healthy male.   She would also like me to note in the medical record that her brother has never been hospitalized before and this current infection is the first he's ever had.   Will reverse code status to Full Code  Family meeting scheduled for 3/23 AM with Dr. Lorrin GoodellMcQauid  Additional CC time 35 mins  Joneen RoachPaul Hoffman, AGACNP-BC [facility] Pager [phone] or [phone]  04/02/2015 6:51 PM

## 2015-04-03 LAB — RENAL FUNCTION PANEL
ALBUMIN: 1.7 g/dL — AB (ref 3.5–5.0)
ANION GAP: 15 (ref 5–15)
BUN: 92 mg/dL — AB (ref 6–20)
CO2: 25 mmol/L (ref 22–32)
CREATININE: 2.73 mg/dL — AB (ref 0.61–1.24)
Calcium: 8.3 mg/dL — ABNORMAL LOW (ref 8.9–10.3)
Chloride: 95 mmol/L — ABNORMAL LOW (ref 101–111)
GFR calc Af Amer: 33 mL/min — ABNORMAL LOW (ref >60)
GFR, EST NON AFRICAN AMERICAN: 28 mL/min — AB (ref >60)
Glucose, Bld: 125 mg/dL — ABNORMAL HIGH (ref 65–99)
PHOSPHORUS: 11 mg/dL — AB (ref 2.5–4.6)
POTASSIUM: 5.2 mmol/L — AB (ref 3.5–5.1)
Sodium: 135 mmol/L (ref 135–145)

## 2015-04-03 LAB — GLUCOSE, CAPILLARY
GLUCOSE-CAPILLARY: 113 mg/dL — AB (ref 65–99)
GLUCOSE-CAPILLARY: 107 mg/dL — AB (ref 65–99)
GLUCOSE-CAPILLARY: 99 mg/dL (ref 65–99)
Glucose-Capillary: 110 mg/dL — ABNORMAL HIGH (ref 65–99)
Glucose-Capillary: 121 mg/dL — ABNORMAL HIGH (ref 65–99)
Glucose-Capillary: 99 mg/dL (ref 65–99)

## 2015-04-03 LAB — CULTURE, RESPIRATORY W GRAM STAIN: Special Requests: NORMAL

## 2015-04-03 LAB — CULTURE, RESPIRATORY

## 2015-04-03 LAB — CBC
HEMATOCRIT: 22.4 % — AB (ref 39.0–52.0)
HEMOGLOBIN: 7.5 g/dL — AB (ref 13.0–17.0)
MCH: 27.7 pg (ref 26.0–34.0)
MCHC: 33.5 g/dL (ref 30.0–36.0)
MCV: 82.7 fL (ref 78.0–100.0)
Platelets: 188 10*3/uL (ref 150–400)
RBC: 2.71 MIL/uL — ABNORMAL LOW (ref 4.22–5.81)
RDW: 15.8 % — AB (ref 11.5–15.5)
WBC: 38.9 10*3/uL — ABNORMAL HIGH (ref 4.0–10.5)

## 2015-04-03 LAB — PATHOLOGIST SMEAR REVIEW

## 2015-04-03 MED ORDER — HEPARIN SODIUM (PORCINE) 1000 UNIT/ML DIALYSIS
5000.0000 [IU] | INTRAMUSCULAR | Status: DC | PRN
  Administered 2015-04-03 – 2015-04-05 (×2): 5000 [IU] via INTRAVENOUS_CENTRAL

## 2015-04-03 NOTE — Progress Notes (Signed)
Patient ID: Juan Cameron, male   DOB: 1975-09-06, 40 y.o.   MRN: [id number]  [facility] Progress Note    Assessment/ Plan:   1. AKI- Now anuric: Suspected ATN from sepsis, remains anuric with the plan for daily dialysis to try and confer him some clearance to see if it makes a positive impact on mental status/overall prognosis. The proposed plan is to continue dialysis until the end of this week and then reassess goals of care-chronic hemodialysis is likely to negatively impact his quality of life (and if he remains ventilator dependent-will likely need to be in LTAC). Volume excess noted on physical exam with attempted ultrafiltration with dialysis. 2. Acute hypoxic respiratory failure/ARDS/HCAP/aspiration pneumonia: Remains ventilator dependent via tracheostomy. Remains on broad-spectrum antibiotic therapy with vancomycin/meropenem and corticosteroids. 3. Septic shock: Leukocyte count improving on vancomycin/meropenem-overall, with improved markers of sepsis 4. Altered mental status: Suspected multifactorial including from uremia for which dialysis is being provided. With baseline of autism.  Subjective:   No acute events overnight-informed that sister has now made him "full code". Anticipated to have family meeting this morning however, did not happen at 8 AM as planned.    Objective:   BP 121/58 mmHg  Pulse 106  Temp(Src) 97.2 F (36.2 C) (Rectal)  Resp 16  Ht  (1.753 m)  Wt 103.5 kg (228 lb 2.8 oz)  BMI 33.68 kg/m2  SpO2 99%  Intake/Output Summary (Last 24 hours) at 04/03/15 8469 Last data filed at 04/03/15 0800  Gross per 24 hour  Intake   2236 ml  Output   2000 ml  Net    236 ml   Weight change: -3.2 kg (-7 lb 0.9 oz)  Physical Exam: Gen: On ventilator via tracheostomy, sedated CVS: Pulse regular tachycardia, S1 and S2 normal Resp: Coarse bilaterally-no distinct rales or rhonchi Abd: Soft, obese, nontender Ext: 2+ lower extremity edema  Imaging: Dg  Chest Port 1 View  04/02/2015  CLINICAL DATA:  Respiratory failure. EXAM: PORTABLE CHEST 1 VIEW COMPARISON:  04/01/2015. FINDINGS: Tracheostomy tube, NG tube, right IJ line stable position. Mediastinum hilar structures are unremarkable. Heart size normal. Low lung volumes with bibasilar atelectasis and/or infiltrates, progressed from prior exam. No pleural effusion or pneumothorax. IMPRESSION: 1. Lines and tubes in stable position. 2. Low lung volumes with bibasilar atelectasis and/or infiltrates, progressed from prior exam. Electronically Signed   ByMaisie Fus  Register   On: 04/02/2015 07:20    Labs: BMET  Recent Labs Lab 03/29/15 0510 03/30/15 0425 03/30/15 1740 03/31/15 0423 04/01/15 0551 04/02/15 0415 04/03/15 0700  NA 140 138 136 137 137 132* 135  K 5.4* 5.9* 5.0 5.0 4.7 4.7 5.2*  CL 96* 93* 100* 96* 97* 95* 95*  CO2 GLUCOSE 180* 167* 298* 197* 121* 126* 125*  BUN 167* 140* 43* 124* 115* 77* 92*  CREATININE 7.02* 5.22* 1.33* 4.06* 3.32* 2.34* 2.73*  CALCIUM 8.1* 8.3* 9.1 8.1* 8.1* 7.9* 8.3*  PHOS  --  9.9* 5.4*  --   --   --  11.0*   CBC  Recent Labs Lab 03/28/15 0400 03/29/15 0510  03/31/15 0423 04/01/15 0551 04/02/15 0415 04/03/15 0700  WBC 18.2* 19.4*  < > 41.9* 50.5* 50.3* 38.9*  NEUTROABS 15.7* 16.2*  --   --  45.5* 44.3*  --   HGB 7.2* 7.4*  < > 8.0* 8.3* 8.2* 7.5*  HCT 22.1* 22.0*  < > 24.1* 25.0* 24.5* 22.4*  MCV 80.4  80.3  < > 80.1 79.1 80.3 82.7  PLT 153 177  < > 214 236 224 188  < > = values in this interval not displayed.  Medications:    . antiseptic oral rinse  7 mL Mouth Rinse QID  . chlorhexidine gluconate  15 mL Mouth Rinse BID  . docusate  100 mg Per Tube Daily  . feeding supplement (PRO-STAT SUGAR FREE 64)  30 mL Per Tube BID  . feeding supplement (VITAL HIGH PROTEIN)  1,000 mL Per Tube Q24H  . heparin subcutaneous  5,000 Units Subcutaneous 3 times per day  . insulin aspart  2-6 Units Subcutaneous 6 times per day  .  ipratropium-albuterol  3 mL Nebulization Q4H  . meropenem (MERREM) IV  500 mg Intravenous Q24H  . pantoprazole sodium  40 mg Per Tube Q1200  . sodium chloride flush  3 mL Intravenous Q12H  . vancomycin  1,000 mg Intravenous Q24H   Zetta BillsJay Nitya Cauthon, MD 04/03/2015, 8:28 AM

## 2015-04-03 NOTE — Progress Notes (Signed)
PULMONARY / CRITICAL CARE MEDICINE   Name: Juan Cameron MRN: [id number] DOB: 1975/04/01    ADMISSION DATE:  03/11/2015  CONSULTATION DATE: 2/28  REFERRING MD: Triad  CHIEF COMPLAINT: Acute resp failure  BRIEF:  5 autistic male recently in hospital for multilobar pneumonia and dc'd on 6 l [place] and returned in 24 hours with sats in 50%. He was discharged on 03/10/15 after treatment for CAP, NOI, and was on 6 l [place]. Sister [facility]) checked sats and he was 50% and therefore was transported back to [facility] and required 80% fio2 on NIVMS with RR 45. He developed ARDS adn was intubated  SUBJECTIVE:  Oxygenation improved WBC down Family changed code status to Full code yesterday Not here for family conversation we had planned today Back on pressure support this morning  VITAL SIGNS: BP 106/60 mmHg  Pulse 92  Temp(Src) 97.2 F (36.2 C) (Rectal)  Resp 19  Ht  (1.753 m)  Wt 103.5 kg (228 lb 2.8 oz)  BMI 33.68 kg/m2  SpO2 100%  HEMODYNAMICS:    VENTILATOR SETTINGS: Vent Mode:  [-] PRVC FiO2 (%):  [40 %-60 %] 40 % Set Rate:  [22 bmp] 22 bmp Vt Set:  [400 mL] 400 mL PEEP:  [5 cmH20] 5 cmH20 Plateau Pressure:  [15 cmH20-22 cmH20] 15 cmH20  INTAKE / OUTPUT: I/O last 3 completed shifts: In: 3274 [I.V.:1044; NG/GT:1980; IV Piggyback:250] Out: 2000 [Other:2000]  PHYSICAL EXAMINATION: General: more awake on vent today HEENT: trach site c/d/i, NG in place PULM: few wheezes today bilaterally, vent supported breaths but initiating breaths on his owen CV: RRR, no mgr GI: Belly soft, nontender MSK: normal bulk and tone Derm: no rash or skin breakdown Neuro: opens eyes to voice for me today  LABS:  BMET  Recent Labs Lab 04/01/15 0551 04/02/15 0415 04/03/15 0700  NA 137 132* 135  K 4.7 4.7 5.2*  CL 97* 95* 95*  CO2 BUN 115* 77* 92*  CREATININE 3.32* 2.34* 2.73*  GLUCOSE 121* 126* 125*   Electrolytes  Recent Labs Lab 03/30/15 0425 03/30/15 1740   04/01/15 0551 04/02/15 0415 04/03/15 0700  CALCIUM 8.3* 9.1  < > 8.1* 7.9* 8.3*  MG 2.7*  --   --   --   --   --   PHOS 9.9* 5.4*  --   --   --  11.0*  < > = values in this interval not displayed. CBC  Recent Labs Lab 04/01/15 0551 04/02/15 0415 04/03/15 0700  WBC 50.5* 50.3* 38.9*  HGB 8.3* 8.2* 7.5*  HCT 25.0* 24.5* 22.4*  PLT 236 224 188   Coag's No results for input(s): APTT, INR in the last 168 hours. Sepsis Markers No results for input(s): LATICACIDVEN, PROCALCITON, O2SATVEN in the last 168 hours. ABG  Recent Labs Lab 03/31/15 0431 03/31/15 1447 04/02/15 1004  PHART 7.346* 7.483* 7.247*  PCO2ART 56.1* 43.3 78.2*  PO2ART 114.0* 110.0* 133.0*   Liver Enzymes  Recent Labs Lab 03/28/15 0400 03/29/15 0510 03/30/15 1740 04/03/15 0700  AST 45* 193*  --   --   ALT 38 53  --   --   ALKPHOS 104 89  --   --   BILITOT 0.7 0.8  --   --   ALBUMIN 2.3* 2.3* 1.7* 1.7*    Cardiac Enzymes No results for input(s): TROPONINI, PROBNP in the last 168 hours. Glucose  Recent Labs Lab 04/02/15 0841 04/02/15 1122 04/02/15 1550 04/02/15 1954 04/03/15 0021 04/03/15  0309  GLUCAP 122* 127* 114* 115* 110* 113*   Imaging  3/22 CXR images personally reviewed> worsening airspace disease bilateral bases  CULTURES: 2/28 bc x 2 negative 2/28 uc negative 2/28 RVP negative Sputum culture 3/4, 3/5, 3/9 >>yeast Blood culture 3/4 >>  Blood 3/10 >  Resp culture 3/9 >  Urine 3/10 >   Resp culture 3/21 >   ANTIBIOTICS: 2/28 vanc>>3/10 2/28 cefepime>>3/10 3/14 ceftaz>>>plan stop 3/21 3/14 vanc>>>3/16 3/14 anidulo>>>plan stop 21  3/21 vanc >> 3/21 meropenem >>   SIGNIFICANT EVENTS: 2/28 readmit to hospital  03/12/15 Intubated 3/1 Bronchoscopy  3/14- unable to ventilate or oxygenate> DNR 3/15- pos 6.8 liters 3/16- HD started  LINES/TUBES: Central line L IJ TLC>3/1 Trach 3/9>>> Rt ij hd 3/16>>>  ASSESSMENT / PLAN:  PULMONARY A: ARDS from severe CAP vs  HCAP; High pCO2 yesterday with relatively high minute volume: significant dead space from ARDS New HCAP 3/21 Suspected undiagnosed OSA Pulm edema > improving P:  Adjust FiO2, PEEP for goal PaO2 55-65 Pressure support as able Maintain RR 14 Continue volume removal with HD VAP prevention See ID  CARDIOVASCULAR A:  Septic Shock -- resolved P:  Tele  RENAL A:  Acute Kidney Injury, ARF P:  HD pre renal  GASTROINTESTINAL A:  Constipation on high dose narcotics, improved P:  Cont TF Bowel regimen  HEMATOLOGIC A:  Hx of sickle cell trait VTE  Anemia without bleeding P:  Continue SQ heparin  INFECTIOUS A:  Recurrent PNA with ARDS HCAP 3/21, fever/WBC better 3/23 P:  Vanc/meropenem to continue until resp cultures back Follow resp culture    ENDOCRINE  A:  Hyperglycemia  P:  Sliding scale insulin  NEUROLOGIC A:  Mentally impaired with limited vocabulary of 500 words Autism  Acute encephalopathy from metabolic disarray P:  RASS goal: -1 Versed, fentanyl severe debiliation  Family: they are supposed to be here now for our planned family conversation. Will await their arrival.  [name] discussed situation with our nurse practitioner yesterday and made him a Full code.  I will need to meet with her today to discuss this more.  Given Juan Cameron's severe acute illness with multi-organ failure his overall prognosis is poor.  The likelihood of a complication as he recovers is exceedingly high and his "God-mother" who is a Freight forwardernurse practioner says that it is not reasonable to expect he will be able to participate in a rehab process that would be necessary for his recovery.  Further, his disposition options are severely limited with respiratory failure requiring ventilation and hemodialysis.  I agree with renal that we should give him a set time to have renal recovery (10 days) and not prolong hemodialysis beyond that.  My cc time 33  minutes  Heber CarolinaBrent McQuaid, MD [place] [facility] Pager: [phone] Cell: [phone] After 3pm or if no response, call [phone]  04/03/2015, 7:58 AM

## 2015-04-04 ENCOUNTER — Inpatient Hospital Stay (HOSPITAL_COMMUNITY): Payer: Medicare Other

## 2015-04-04 LAB — GLUCOSE, CAPILLARY
GLUCOSE-CAPILLARY: 100 mg/dL — AB (ref 65–99)
GLUCOSE-CAPILLARY: 105 mg/dL — AB (ref 65–99)
Glucose-Capillary: 96 mg/dL (ref 65–99)
Glucose-Capillary: 118 mg/dL — ABNORMAL HIGH (ref 65–99)
Glucose-Capillary: 109 mg/dL — ABNORMAL HIGH (ref 65–99)
Glucose-Capillary: 113 mg/dL — ABNORMAL HIGH (ref 65–99)

## 2015-04-04 LAB — CBC WITH DIFFERENTIAL/PLATELET
BASOS ABS: 0.4 10*3/uL — AB (ref 0.0–0.1)
Basophils Relative: 1 %
EOS ABS: 0.4 10*3/uL (ref 0.0–0.7)
Eosinophils Relative: 1 %
HEMATOCRIT: 23.9 % — AB (ref 39.0–52.0)
Hemoglobin: 7.9 g/dL — ABNORMAL LOW (ref 13.0–17.0)
LYMPHS ABS: 1.3 10*3/uL (ref 0.7–4.0)
Lymphocytes Relative: 3 %
MCH: 27.3 pg (ref 26.0–34.0)
MCHC: 33.1 g/dL (ref 30.0–36.0)
MCV: 82.7 fL (ref 78.0–100.0)
MONO ABS: 2.1 10*3/uL — AB (ref 0.1–1.0)
Monocytes Relative: 5 %
Neutro Abs: 37.6 10*3/uL — ABNORMAL HIGH (ref 1.7–7.7)
Neutrophils Relative %: 90 %
PLATELETS: 205 10*3/uL (ref 150–400)
RBC: 2.89 MIL/uL — AB (ref 4.22–5.81)
RDW: 16.2 % — AB (ref 11.5–15.5)
WBC: 41.8 10*3/uL — AB (ref 4.0–10.5)

## 2015-04-04 LAB — BASIC METABOLIC PANEL
Anion gap: 12 (ref 5–15)
BUN: 80 mg/dL — AB (ref 6–20)
CALCIUM: 8.6 mg/dL — AB (ref 8.9–10.3)
CO2: 26 mmol/L (ref 22–32)
CREATININE: 2.43 mg/dL — AB (ref 0.61–1.24)
Chloride: 97 mmol/L — ABNORMAL LOW (ref 101–111)
GFR calc Af Amer: 37 mL/min — ABNORMAL LOW (ref >60)
GFR, EST NON AFRICAN AMERICAN: 32 mL/min — AB (ref >60)
Glucose, Bld: 121 mg/dL — ABNORMAL HIGH (ref 65–99)
Potassium: 4.6 mmol/L (ref 3.5–5.1)
SODIUM: 135 mmol/L (ref 135–145)

## 2015-04-04 MED ORDER — HEPARIN SODIUM (PORCINE) 1000 UNIT/ML DIALYSIS
5000.0000 [IU] | INTRAMUSCULAR | Status: DC | PRN
  Filled 2015-04-04: qty 5

## 2015-04-04 NOTE — Progress Notes (Signed)
Patient ID: Juan Cameron, male   DOB: Aug 01, 1975, 40 y.o.   MRN: [id number]  [facility] Progress Note    Assessment/ Plan:   1. AKI- Now anuric: Suspected ATN from sepsis, remains anuric with the plan for daily dialysis until tomorrow to try and confer him clearance --so far, it appears that his mentation has improved. Volume excess noted on physical exam with attempted ultrafiltration with dialysis. His sister Education officer, community) is encouraged by the improvement of his mental status and is unwilling to believe that long-term dialysis would be negatively impacting on his quality of life. The initial plan was to do a time limited trial of dialysis for 10 days however, she now wants to do this longer (even if it means to him being admitted to [facility]). 2. Acute hypoxic respiratory failure/ARDS/HCAP/aspiration pneumonia: Remains ventilator dependent via tracheostomy. Remains on broad-spectrum antibiotic therapy with vancomycin/meropenem and corticosteroids. 3. Septic shock: Leukocyte count improving on vancomycin/meropenem-overall, with improved markers of sepsis 4. Altered mental status: Suspected multifactorial including from hypoxic respiratory failure/sepsis/uremia for which dialysis is being provided. With baseline of autism.  Subjective:   Noted to be more responsive this morning- discussed with sister and caretaker who were at his bedside this morning.    Objective:   BP 112/47 mmHg  Pulse 101  Temp(Src) 98 F (36.7 C) (Rectal)  Resp 24  Ht 5' 9"  (1.753 m)  Wt 107 kg (235 lb 14.3 oz)  BMI 34.82 kg/m2  SpO2 99%  Intake/Output Summary (Last 24 hours) at 04/04/15 0932 Last data filed at 04/04/15 0800  Gross per 24 hour  Intake 2441.46 ml  Output   2000 ml  Net 441.46 ml   Weight change: 1.1 kg (2 lb 6.8 oz)  Physical Exam: Gen: On ventilator via tracheostomy, will open his eyes on calling his name CVS: Pulse regular tachycardia, S1 and S2 normal Resp: Coarse bilaterally-no  distinct rales or rhonchi Abd: Soft, obese, nontender Ext: 2+-3+ lower extremity edema  Imaging: Dg Chest Port 1 View  04/04/2015  CLINICAL DATA:  Acute respiratory failure EXAM: PORTABLE CHEST 1 VIEW COMPARISON:  04/02/2015 FINDINGS: Cardiac shadow is stable. Right jugular line, tracheostomy tube and nasogastric catheter are stable in appearance. There is improved aeration in the bases bilaterally with resolution of right-sided atelectatic changes. Mild residual left basilar atelectasis is noted. No bony abnormality is seen. IMPRESSION: Mild left basilar atelectasis. Electronically Signed   By: Inez Catalina M.D.   On: 04/04/2015 07:27    Labs: BMET  Recent Labs Lab 03/30/15 0425 03/30/15 1740 03/31/15 0423 04/01/15 0551 04/02/15 0415 04/03/15 0700 04/04/15 0454  NA 138 136 137 137 132* 135 135  K 5.9* 5.0 5.0 4.7 4.7 5.2* 4.6  CL 93* 100* 96* 97* 95* 95* 97*  CO2 25 26 25 25 26 25 26   GLUCOSE 167* 298* 197* 121* 126* 125* 121*  BUN 140* 43* 124* 115* 77* 92* 80*  CREATININE 5.22* 1.33* 4.06* 3.32* 2.34* 2.73* 2.43*  CALCIUM 8.3* 9.1 8.1* 8.1* 7.9* 8.3* 8.6*  PHOS 9.9* 5.4*  --   --   --  11.0*  --    CBC  Recent Labs Lab 03/29/15 0510  04/01/15 0551 04/02/15 0415 04/03/15 0700 04/04/15 0454  WBC 19.4*  < > 50.5* 50.3* 38.9* 41.8*  NEUTROABS 16.2*  --  45.5* 44.3*  --  PENDING  HGB 7.4*  < > 8.3* 8.2* 7.5* 7.9*  HCT 22.0*  < > 25.0* 24.5* 22.4* 23.9*  MCV 80.3  < >  79.1 80.3 82.7 82.7  PLT 177  < > 236 224 188 205  < > = values in this interval not displayed.  Medications:    . antiseptic oral rinse  7 mL Mouth Rinse QID  . chlorhexidine gluconate (SAGE KIT)  15 mL Mouth Rinse BID  . docusate  100 mg Per Tube Daily  . feeding supplement (PRO-STAT SUGAR FREE 64)  30 mL Per Tube BID  . feeding supplement (VITAL HIGH PROTEIN)  1,000 mL Per Tube Q24H  . heparin subcutaneous  5,000 Units Subcutaneous 3 times per day  . insulin aspart  2-6 Units Subcutaneous 6 times  per day  . ipratropium-albuterol  3 mL Nebulization Q4H  . meropenem (MERREM) IV  500 mg Intravenous Q24H  . pantoprazole sodium  40 mg Per Tube Q1200  . sodium chloride flush  3 mL Intravenous Q12H  . vancomycin  1,000 mg Intravenous Q24H   Elmarie Shiley, MD 04/04/2015, 8:38 AM

## 2015-04-04 NOTE — Progress Notes (Signed)
CSW attempted Patient's sister via T/C re: SNF questions. Unable to leave voicemail as it was full. CSW will attempt sister again on Monday.   Noe GensAshley Gardner, MSW, LCSW [facility] Clinical Social Worker [phone]

## 2015-04-04 NOTE — Progress Notes (Signed)
Nutrition Follow-up  DOCUMENTATION CODES:   Obesity unspecified  INTERVENTION:  -Continue Vital High Protein via NGT at 50 ml/h (1200 ml/day) with 30 ml Prostat BID to provide 1400 kcals, 135 gm protein, 1003 ml free water daily   NUTRITION DIAGNOSIS:   Inadequate oral intake related to inability to eat as evidenced by NPO status.  -Ongoing  GOAL:   Provide needs based on ASPEN/SCCM guidelines  -Met   MONITOR:   Vent status, Labs, Weight trends, Skin, I & O's  ASSESSMENT:   40 yo autistic with limited vocabulary, recently admitted 2/17-2/27 for severe CAP.  CT scan negative for pulmonary embolism and showing bilateral lower lobe consolidation with patchy upper lobe infiltrates, rapid flu test was negative as well as urine strep antigen.  Echo showed diastolic dysfunction.  Pt had difficulty getting oxygen upon return home, his sister- POA, noted increasing hypoxia and brought him back to hospital.  3/1-Intubated 3/1-NG tube placed  3/16-HD started   MV: 9 L/min Temp (24hrs), Avg:99 F (37.2 C), Min:98 F (36.7 C), Max:100.5 F (38.1 C) Propofol: None  Medications reviewed. Labs reviewed.   Diet Order:  Diet NPO time specified  Skin:  Wound (see comment) (Stage II Pressure Injury on neck )  Last BM:  03/31/2015  Height:   Ht Readings from Last 1 Encounters:  03/25/15 5' 9"  (1.753 m)    Weight:   Wt Readings from Last 1 Encounters:  04/04/15 235 lb 14.3 oz (107 kg)    Ideal Body Weight:  67.3 kg  BMI:  Body mass index is 34.82 kg/(m^2).  Estimated Nutritional Needs:   Kcal:  1025-8527  Protein:  >/= 135 grams  Fluid:  1.5 L  EDUCATION NEEDS:   No education needs identified at this time  Raford Pitcher, Dietetic Intern Pager: [phone]

## 2015-04-04 NOTE — Care Management Note (Signed)
Case Management Note  Patient Details  Name: Alan RipperRobert Baade MRN: [id number] Date of Birth: 1975/02/04  Subjective/Objective:  Pt admitted from home with Respiratory Distress. Pt lives with sister - baseline lower functioning autistic.                  Action/Plan: 04/04/2015  Attending spoke with sister [name] regarding prognosis and goals of care.  Sister is still interested in HD.  Attending will reassess/readdress perm HD catheter placement on Monday 04/07/15.  CM spoke with sister about both LTACH choices, [facility]  [facility] and [facility]W following for disposition.  03/21/15 Pt spoke in depth with sister regarding possible need for SNF  post discharge, sister is interested and CSW actively working placement.    03/20/15 Pt trached.  CM spoke with physician advisor; pt not appropriate for LTACH at this time.  CSW following pt for SNF placement.  CM will continue to monitor for disposition needs   03/14/15 Pt in on ventilator  Pt is recent admission that was d/c home with 02 via [facility] on 4L via [place]. [facility] once stable for d/c. Plan for transfer to unit. CM to continue to monitor for additional needs.    Expected Discharge Date:                  Expected Discharge Plan:  Home w Home Health Services  In-House Referral:  NA  Discharge planning Services  CM Consult  Post Acute Care Choice:  Home Health Choice offered to:     DME Arranged:    DME Agency:  [facility]  HH [facility]    [facility]:     Status of Service:  In process, will continue to follow  Medicare Important Message Given:  Other (see comment) Date Medicare IM Given:    Medicare IM give by:    Date Additional Medicare IM Given:    Additional Medicare Important Message give by:     If discussed at Long Length of Stay Meetings, dates discussed:    Additional Comments:  Cherylann ParrClaxton, Chareese Sergent S, RN 04/04/2015, 2:53 PM

## 2015-04-04 NOTE — Progress Notes (Signed)
PULMONARY / CRITICAL CARE MEDICINE   Name: Juan Cameron MRN: [id number] DOB: 1975/03/22    ADMISSION DATE:  03/11/2015  CONSULTATION DATE: 2/28  REFERRING MD: Triad  CHIEF COMPLAINT: Acute resp failure  BRIEF:  25 autistic male recently in hospital for multilobar pneumonia and dc'd on 6 l [place] and returned in 24 hours with sats in 50%. He was discharged on 03/10/15 after treatment for CAP, NOI, and was on 6 l [place]. Sister [facility]) checked sats and he was 50% and therefore was transported back to [facility] and required 80% fio2 on NIVMS with RR 45. He developed ARDS adn was intubated  SUBJECTIVE:  Weaned on pressure support yesterday More awake today, opening his eyes Weaning sedation  VITAL SIGNS: BP 112/47 mmHg  Pulse 101  Temp(Src) 98 F (36.7 C) (Rectal)  Resp 24  Ht  (1.753 m)  Wt 107 kg (235 lb 14.3 oz)  BMI 34.82 kg/m2  SpO2 99%  HEMODYNAMICS:    VENTILATOR SETTINGS: Vent Mode:  [-] PSV FiO2 (%):  [40 %] 40 % Set Rate:  [22 bmp] 22 bmp Vt Set:  [400 mL] 400 mL PEEP:  [5 cmH20] 5 cmH20 Pressure Support:  [12 cmH20] 12 cmH20 Plateau Pressure:  [13 cmH20-16 cmH20] 15 cmH20  INTAKE / OUTPUT: I/O last 3 completed shifts: In: 3651.8 [I.V.:961.8; NG/GT:2040; IV Piggyback:650] Out: 2000 [Other:2000]  PHYSICAL EXAMINATION: General: more awake on vent today HEENT: trach site c/d/i, NG in place PULM: few wheezes today bilaterally, vent supported breaths but initiating breaths on his own CV: RRR, no mgr GI: Belly soft, nontender MSK: normal bulk and tone Derm: no rash or skin breakdown Neuro: eyes open on my arrival, looking around room  LABS:  BMET  Recent Labs Lab 04/02/15 0415 04/03/15 0700 04/04/15 0454  NA 132* 135 135  K 4.7 5.2* 4.6  CL 95* 95* 97*  CO2 BUN 77* 92* 80*  CREATININE 2.34* 2.73* 2.43*  GLUCOSE 126* 125* 121*   Electrolytes  Recent Labs Lab 03/30/15 0425 03/30/15 1740  04/02/15 0415 04/03/15 0700 04/04/15 0454   CALCIUM 8.3* 9.1  < > 7.9* 8.3* 8.6*  MG 2.7*  --   --   --   --   --   PHOS 9.9* 5.4*  --   --  11.0*  --   < > = values in this interval not displayed. CBC  Recent Labs Lab 04/02/15 0415 04/03/15 0700 04/04/15 0454  WBC 50.3* 38.9* 41.8*  HGB 8.2* 7.5* 7.9*  HCT 24.5* 22.4* 23.9*  PLT 224 188 205   Coag's No results for input(s): APTT, INR in the last 168 hours. Sepsis Markers No results for input(s): LATICACIDVEN, PROCALCITON, O2SATVEN in the last 168 hours. ABG  Recent Labs Lab 03/31/15 0431 03/31/15 1447 04/02/15 1004  PHART 7.346* 7.483* 7.247*  PCO2ART 56.1* 43.3 78.2*  PO2ART 114.0* 110.0* 133.0*   Liver Enzymes  Recent Labs Lab 03/29/15 0510 03/30/15 1740 04/03/15 0700  AST 193*  --   --   ALT 53  --   --   ALKPHOS 89  --   --   BILITOT 0.8  --   --   ALBUMIN 2.3* 1.7* 1.7*    Cardiac Enzymes No results for input(s): TROPONINI, PROBNP in the last 168 hours. Glucose  Recent Labs Lab 04/03/15 0747 04/03/15 1130 04/03/15 1557 04/03/15 2043 04/03/15 2332 04/04/15 0322  GLUCAP 107* 99 99 121* 100* 105*   Imaging  3/24  CXR images personally reviewed> persistent left lung pneumonia, ETT in place, improved aeration right lung  CULTURES: 2/28 bc x 2 negative 2/28 uc negative 2/28 RVP negative Sputum culture 3/4, 3/5, 3/9 >>yeast Blood culture 3/4 >>  Blood 3/10 >  Resp culture 3/9 >  Urine 3/10 >   Resp culture 3/21 >   ANTIBIOTICS: 2/28 vanc>>3/10 2/28 cefepime>>3/10 3/14 ceftaz>>>plan stop 3/21 3/14 vanc>>>3/16 3/14 anidulo>>>plan stop 21  3/21 vanc >> 3/24 3/21 meropenem >> 7 days  SIGNIFICANT EVENTS: 2/28 readmit to hospital  03/12/15 Intubated 3/1 Bronchoscopy  3/14- unable to ventilate or oxygenate> DNR 3/15- pos 6.8 liters 3/16- HD started  LINES/TUBES: Central line L IJ TLC>3/1 Trach 3/9>>> Rt ij hd 3/16>>>  ASSESSMENT / PLAN:  PULMONARY A: ARDS from severe CAP vs HCAP; High pCO2 yesterday with relatively  high minute volume: significant dead space from ARDS New HCAP 3/21 Suspected undiagnosed OSA Pulm edema > improving P:  Adjust FiO2, PEEP for goal PaO2 55-65 Pressure support as able Continue volume removal with HD VAP prevention See ID  CARDIOVASCULAR A:  Septic Shock -- resolved P:  Tele  RENAL A:  Acute Kidney Injury, ARF P:  HD pre renal  GASTROINTESTINAL A:  Constipation on high dose narcotics, improved P:  Cont TF Bowel regimen  HEMATOLOGIC A:  Hx of sickle cell trait VTE  Anemia without bleeding P:  Continue SQ heparin  INFECTIOUS A:  Recurrent PNA with ARDS HCAP 3/21, fever/WBC better 3/23 Persistent leukocytosis P:  Stop vanc Continue meropenem Follow resp culture Remove HD cath 3/25 after HD    ENDOCRINE  A:  Hyperglycemia  P:  Sliding scale insulin  NEUROLOGIC A:  Mentally impaired with limited vocabulary of 500 words Autism  Acute encephalopathy from metabolic disarray P:  RASS goal: -1 Versed, fentanyl drips, wean severe debiliation  Family: Updated sister [name] today.  She wants him to receive ongoing dialysis, LTAC, and receive chest compressions. In a 30 minute conversation I expressed my concern for his inability to participate in PT at an LTAC, his high risk of complication, and the high likelihood of death in the rehab process.  This would also involve uncecessary pain and suffering.  She has an unrealistic expectation for Rob's ability to participate in PT.  Remains full code.  My cc time 60 minutes  Heber CarolinaBrent Harlie Buening, MD [place] [facility] Pager: [phone] Cell: [phone] After 3pm or if no response, call [phone]  04/04/2015, 9:01 AM

## 2015-04-05 ENCOUNTER — Inpatient Hospital Stay (HOSPITAL_COMMUNITY): Payer: Medicare Other

## 2015-04-05 DIAGNOSIS — L8990 Pressure ulcer of unspecified site, unspecified stage: Secondary | ICD-10-CM | POA: Insufficient documentation

## 2015-04-05 LAB — CBC WITH DIFFERENTIAL/PLATELET
BASOS ABS: 0.4 10*3/uL — AB (ref 0.0–0.1)
Basophils Relative: 1 %
EOS PCT: 1 %
Eosinophils Absolute: 0.4 10*3/uL (ref 0.0–0.7)
HEMATOCRIT: 23.9 % — AB (ref 39.0–52.0)
Hemoglobin: 7.8 g/dL — ABNORMAL LOW (ref 13.0–17.0)
LYMPHS ABS: 1.4 10*3/uL (ref 0.7–4.0)
Lymphocytes Relative: 4 %
MCH: 26.8 pg (ref 26.0–34.0)
MCHC: 32.6 g/dL (ref 30.0–36.0)
MCV: 82.1 fL (ref 78.0–100.0)
MONO ABS: 1.8 10*3/uL — AB (ref 0.1–1.0)
Monocytes Relative: 5 %
Neutro Abs: 31 10*3/uL — ABNORMAL HIGH (ref 1.7–7.7)
Neutrophils Relative %: 89 %
PLATELETS: 191 10*3/uL (ref 150–400)
RBC: 2.91 MIL/uL — AB (ref 4.22–5.81)
RDW: 16.9 % — ABNORMAL HIGH (ref 11.5–15.5)
WBC: 35 10*3/uL — AB (ref 4.0–10.5)

## 2015-04-05 LAB — BASIC METABOLIC PANEL
Anion gap: 12 (ref 5–15)
BUN: 60 mg/dL — AB (ref 6–20)
CO2: 27 mmol/L (ref 22–32)
CREATININE: 2.04 mg/dL — AB (ref 0.61–1.24)
Calcium: 8.6 mg/dL — ABNORMAL LOW (ref 8.9–10.3)
Chloride: 96 mmol/L — ABNORMAL LOW (ref 101–111)
GFR calc Af Amer: 46 mL/min — ABNORMAL LOW (ref >60)
GFR, EST NON AFRICAN AMERICAN: 40 mL/min — AB (ref >60)
Glucose, Bld: 124 mg/dL — ABNORMAL HIGH (ref 65–99)
Potassium: 3.9 mmol/L (ref 3.5–5.1)
SODIUM: 135 mmol/L (ref 135–145)

## 2015-04-05 LAB — GLUCOSE, CAPILLARY
GLUCOSE-CAPILLARY: 101 mg/dL — AB (ref 65–99)
GLUCOSE-CAPILLARY: 101 mg/dL — AB (ref 65–99)
Glucose-Capillary: 108 mg/dL — ABNORMAL HIGH (ref 65–99)
Glucose-Capillary: 108 mg/dL — ABNORMAL HIGH (ref 65–99)
Glucose-Capillary: 113 mg/dL — ABNORMAL HIGH (ref 65–99)
Glucose-Capillary: 120 mg/dL — ABNORMAL HIGH (ref 65–99)

## 2015-04-05 MED ORDER — HEPARIN 1000 UNIT/ML FOR PERITONEAL DIALYSIS
2500.0000 [IU] | INTRAMUSCULAR | Status: DC | PRN
  Filled 2015-04-05: qty 2.5

## 2015-04-05 NOTE — Procedures (Signed)
Patient seen on Hemodialysis. QB 400, UF goal 3L Treatment adjusted as needed.  Zetta BillsJay Cassidy Tabet MD [facility]. Office # [phone] Pager # [phone] 10:00 AM

## 2015-04-05 NOTE — Progress Notes (Signed)
Patient ID: Juan Cameron, male   DOB: Jun 09, 1975, 40 y.o.   MRN: [id number]  [facility] Progress Note    Assessment/ Plan:   1. AKI- anuric: Suspected ATN from sepsis, remains anuric and on daily hemodialysis (because of his large body size/hypercatabolic state and our efforts to try and see if dialysis would make a positive impact on his altered mentation). Yesterday, I discussed with his [name]) who was encouraged by the improvement of his mental status and was unwilling to believe that long-term dialysis would be negatively impact his QOL. The initial plan was to do a time limited trial of dialysis for 10 days however, she now wants to do this longer (even if it means to him being admitted to [facility]). Will give him a break from dialysis tomorrow and try diuretic challenge. It would be in his best interest not to be on chronic hemodialysis from a clinical standpoint. 2. Acute hypoxic respiratory failure/ARDS/HCAP/aspiration pneumonia: Remains ventilator dependent via tracheostomy. Now he is on meropenem monotherapy and corticosteroids. Continue efforts at weaning per CCM. 3. Septic shock: Leukocyte count improving with improved markers of sepsis-continue meropenem. 4. Altered mental status: Suspected multifactorial including from hypoxic respiratory failure/sepsis/uremia for which dialysis is being provided-he has improved but not back to baseline. (He has autism and baseline mental status is at about a 63-year-old with vocabulary of 500 words)  Subjective:   No acute events overnight-I had discussions with his sister yesterday regarding goals of care and recommendations from a clinical standpoint. She wants to pursue chronic hemodialysis.    Objective:   BP 110/53 mmHg  Pulse 95  Temp(Src) 99.5 F (37.5 C) (Rectal)  Resp 14  Ht _0  (1.753 m)  Wt 103.6 kg (228 lb 6.3 oz)  BMI 33.71 kg/m2  SpO2 100%  Intake/Output Summary (Last 24 hours) at 04/05/15 5638 Last data  filed at 04/05/15 0600  Gross per 24 hour  Intake   1502 ml  Output   2500 ml  Net   -998 ml   Weight change: 0.8 kg (1 lb 12.2 oz)  Physical Exam: Gen: On ventilator via tracheostomy, will open his eyes on calling his name CVS: Pulse regular tachycardia, S1 and S2 normal Resp: Coarse bilaterally-no distinct rales or rhonchi Abd: Soft, obese, nontender Ext: 2+-3+ lower extremity edema  Imaging: Dg Chest Port 1 View  04/05/2015  CLINICAL DATA:  40 year old male with a history of acute respiratory failure EXAM: PORTABLE CHEST 1 VIEW COMPARISON:  04/04/2015 FINDINGS: Cardiomediastinal silhouette unchanged in size and contour. Unchanged position of right IJ central venous catheter. Unchanged tracheostomy tube. Unchanged gastric tube. Low lung volumes persist with bilateral mixed interstitial and airspace opacities. No large consolidation. No pneumothorax is visualized. IMPRESSION: Low lung volumes remain, with the mixed opacities potentially atelectasis, mild edema, and/ or consolidation. Unchanged support apparatus. Signed, Dulcy Fanny. Earleen Newport, DO [facility] Electronically Signed   By: Corrie Mckusick D.O.   On: 04/05/2015 08:14   Dg Chest Port 1 View  04/04/2015  CLINICAL DATA:  Acute respiratory failure EXAM: PORTABLE CHEST 1 VIEW COMPARISON:  04/02/2015 FINDINGS: Cardiac shadow is stable. Right jugular line, tracheostomy tube and nasogastric catheter are stable in appearance. There is improved aeration in the bases bilaterally with resolution of right-sided atelectatic changes. Mild residual left basilar atelectasis is noted. No bony abnormality is seen. IMPRESSION: Mild left basilar atelectasis. Electronically Signed   By: Inez Catalina M.D.   On: 04/04/2015 07:27  Labs: BMET  Recent Labs Lab 03/30/15 0425 03/30/15 1740 03/31/15 0423 04/01/15 0551 04/02/15 0415 04/03/15 0700 04/04/15 0454 04/05/15 0400  NA 138 136 137 137  132* 135 135 135  K 5.9* 5.0 5.0 4.7 4.7 5.2* 4.6 3.9  CL 93* 100* 96* 97* 95* 95* 97* 96*  CO2 _0 GLUCOSE 167* 298* 197* 121* 126* 125* 121* 124*  BUN 140* 43* 124* 115* 77* 92* 80* 60*  CREATININE 5.22* 1.33* 4.06* 3.32* 2.34* 2.73* 2.43* 2.04*  CALCIUM 8.3* 9.1 8.1* 8.1* 7.9* 8.3* 8.6* 8.6*  PHOS 9.9* 5.4*  --   --   --  11.0*  --   --    CBC  Recent Labs Lab 04/01/15 0551 04/02/15 0415 04/03/15 0700 04/04/15 0454 04/05/15 0400  WBC 50.5* 50.3* 38.9* 41.8* 35.0*  NEUTROABS 45.5* 44.3*  --  37.6* 31.0*  HGB 8.3* 8.2* 7.5* 7.9* 7.8*  HCT 25.0* 24.5* 22.4* 23.9* 23.9*  MCV 79.1 80.3 82.7 82.7 82.1  PLT 236 224 188 205 191    Medications:    . antiseptic oral rinse  7 mL Mouth Rinse QID  . chlorhexidine gluconate (SAGE KIT)  15 mL Mouth Rinse BID  . docusate  100 mg Per Tube Daily  . feeding supplement (PRO-STAT SUGAR FREE 64)  30 mL Per Tube BID  . feeding supplement (VITAL HIGH PROTEIN)  1,000 mL Per Tube Q24H  . heparin subcutaneous  5,000 Units Subcutaneous 3 times per day  . insulin aspart  2-6 Units Subcutaneous 6 times per day  . ipratropium-albuterol  3 mL Nebulization Q4H  . meropenem (MERREM) IV  500 mg Intravenous Q24H  . pantoprazole sodium  40 mg Per Tube Q1200  . sodium chloride flush  3 mL Intravenous Q12H   Elmarie Shiley, MD 04/05/2015, 8:21 AM

## 2015-04-05 NOTE — Progress Notes (Signed)
Dialysis treatment completed.  3000 mL ultrafiltrated.  2500 mL net fluid removal.  Patient status unchanged. Lung sounds diminished to ausculation in all fields. BLE 2+ edema. Cardiac: Tachycardic.  Cleansed RIJ catheter with chlorhexidine.  Disconnected lines and flushed ports with saline per protocol.  Ports locked with heparin and capped per protocol.    Report given to bedside, RN Juliette AlcideMelinda.

## 2015-04-05 NOTE — Progress Notes (Signed)
Pharmacy Antibiotic Note  Alan RipperRobert Ayub is a 40 y.o. male admitted on 03/11/2015.  He is currently completing a 7 day course of Meropenem for HCAP. He remains HD-dependent.  His meropenem regimen is appropriately adjusted.  Plan: Continue Meropenem 500mg  IV q24h - last dose to be given on 3/27 Follow for HD plans, HD tolerance, renal recovery  Height: 5\' 9"  (175.3 cm) Weight: 237 lb 10.5 oz (107.8 kg) IBW/kg (Calculated) : 70.7  Temp (24hrs), Avg:99.8 F (37.7 C), Min:99.2 F (37.3 C), Max:100.5 F (38.1 C)   Recent Labs Lab 04/01/15 0551 04/02/15 0415 04/03/15 0700 04/04/15 0454 04/05/15 0400  WBC 50.5* 50.3* 38.9* 41.8* 35.0*  CREATININE 3.32* 2.34* 2.73* 2.43* 2.04*    Estimated Creatinine Clearance: 60 mL/min (by C-G formula based on Cr of 2.04).    Allergies  Allergen Reactions  . Peanuts [Peanut Oil] Anaphylaxis     Thank you for allowing pharmacy to be a part of this patient's care.  Sallee Provencalurner, Diogo Anne S 04/05/2015 1:00 PM

## 2015-04-05 NOTE — Progress Notes (Signed)
Patient weaned on aerosol trach collar since 0745am, noted to have shallow respirations and increased blood tinged secretions. Placed back on vent at this time on psv 10/5, no distress noted, tolerating well at this time.

## 2015-04-05 NOTE — Progress Notes (Signed)
Arrived to patient room 30M-15 at [phone].  Reviewed treatment plan and this RN agrees with plan.  Report received from bedside RN, Juliette AlcideMelinda.  Consent verified.  Patient Opens eyes to voice, trached.   Lung sounds diminished to ausculation in all fields. BLE 2+ edema. Cardiac:  Tachycardic.  Removed caps and cleansed RIJ catheter with chlorhedxidine.  Aspirated ports of heparin and flushed them with saline per protocol.  Connected and secured lines, initiated treatment at 0845.  UF Goal of 3L and net fluid removal 2.5L.  Verbal orders received at bedside prior to start of treament: 3K bath, 5000 units of heparin (line maintenance).  Will continue to monitor.

## 2015-04-05 NOTE — Progress Notes (Signed)
PULMONARY / CRITICAL CARE MEDICINE   Name: Juan Cameron MRN: [id number] DOB: Jul 10, 1975    ADMISSION DATE:  03/11/2015  CONSULTATION DATE: 2/28  REFERRING MD: Triad  CHIEF COMPLAINT: Acute resp failure  BRIEF:  [age] autistic male recently in hospital for multilobar pneumonia and dc'd on 6 l [place] and returned in 24 hours with sats in 50%. He was discharged on 03/10/15 after treatment for CAP, NOI, and was on 6 l [place]. [name]CPOA) checked sats and he was 50% and therefore [facility] and required 80% fio2 on NIVMS with RR 45. He developed ARDS adn was intubated  SUBJECTIVE:  Trach collar today Sedation down WBC down  VITAL SIGNS: BP 124/70 mmHg  Pulse 118  Temp(Src) 99.4 F (37.4 C) (Oral)  Resp 24  Ht 5\' 9"  (1.753 m)  Wt 105.3 kg (232 lb 2.3 oz)  BMI 34.27 kg/m2  SpO2 99%  HEMODYNAMICS:    VENTILATOR SETTINGS: Vent Mode:  [-] PSV FiO2 (%):  [40 %] 40 % Set Rate:  [22 bmp] 22 bmp Vt Set:  [400 mL] 400 mL PEEP:  [5 cmH20] 5 cmH20 Pressure Support:  [10 cmH20-12 cmH20] 10 cmH20 Plateau Pressure:  [16 cmH20] 16 cmH20  INTAKE / OUTPUT: I/O last 3 completed shifts: In: 2882.7 [I.V.:662.7; NG/GT:1970; IV Piggyback:250] Out: 2500 [Other:2500]  PHYSICAL EXAMINATION: General: awake on vent today HEENT: trach site c/d/i, NG in place PULM: coarse breath sounds but no wheezing, vent supported breaths CV: RRR, no mgr GI: Belly soft, nontender MSK: normal bulk and tone Derm: no rash or skin breakdown Neuro: eyes open on my arrival, looking around room  LABS:  BMET  Recent Labs Lab 04/03/15 0700 04/04/15 0454 04/05/15 0400  NA 135 135 135  K 5.2* 4.6 3.9  CL 95* 97* 96*  CO2 25 26 27   BUN 92* 80* 60*  CREATININE 2.73* 2.43* 2.04*  GLUCOSE 125* 121* 124*   Electrolytes  Recent Labs Lab 03/30/15 0425 03/30/15 1740  04/03/15 0700 04/04/15 0454 04/05/15 0400  CALCIUM 8.3* 9.1  < > 8.3* 8.6* 8.6*  MG 2.7*  --   --   --   --   --    PHOS 9.9* 5.4*  --  11.0*  --   --   < > = values in this interval not displayed. CBC  Recent Labs Lab 04/03/15 0700 04/04/15 0454 04/05/15 0400  WBC 38.9* 41.8* 35.0*  HGB 7.5* 7.9* 7.8*  HCT 22.4* 23.9* 23.9*  PLT 188 205 191   Coag's No results for input(s): APTT, INR in the last 168 hours. Sepsis Markers No results for input(s): LATICACIDVEN, PROCALCITON, O2SATVEN in the last 168 hours. ABG  Recent Labs Lab 03/31/15 0431 03/31/15 1447 04/02/15 1004  PHART 7.346* 7.483* 7.247*  PCO2ART 56.1* 43.3 78.2*  PO2ART 114.0* 110.0* 133.0*   Liver Enzymes  Recent Labs Lab 03/30/15 1740 04/03/15 0700  ALBUMIN 1.7* 1.7*    Cardiac Enzymes No results for input(s): TROPONINI, PROBNP in the last 168 hours. Glucose  Recent Labs Lab 04/04/15 1508 04/04/15 2035 04/04/15 2356 04/05/15 0357 04/05/15 0904 04/05/15 1259  GLUCAP 109* 113* 101* 101* 108* 108*   Imaging  3/25 CXR images personally reviewed>bilateral airspace disease unchanged, trach in place  CULTURES: 2/28 bc x 2 negative 2/28 uc negative 2/28 RVP negative Sputum culture 3/4, 3/5, 3/9 >>yeast Blood culture 3/4 >>  Blood 3/10 >  Resp culture 3/9 >  Urine 3/10 >   Resp culture  04/23/22 >   ANTIBIOTICS: 2/28 vanc>>3/10 2/28 cefepime>>3/10 3/14 ceftaz>>>plan stop 04/23/22 3/14 vanc>>>3/16 3/14 anidulo>>>plan stop 21  2022-04-23 vanc >> 3/24 2022-04-23 meropenem >> 7 days  SIGNIFICANT EVENTS: 2/28 readmit to hospital  03/12/15 Intubated 3/1 Bronchoscopy  3/14- unable to ventilate or oxygenate> DNR 3/15- pos 6.8 liters 3/16- HD started  LINES/TUBES: Central line L IJ TLC>3/1 Trach 3/9>>> Rt ij hd 3/16>>> 3/25  ASSESSMENT / PLAN:  PULMONARY A: ARDS from severe CAP vs HCAP; High pCO2 yesterday with relatively high minute volume: significant dead space from ARDS New HCAP April 23, 2022 Suspected undiagnosed OSA Pulm edema > improving P:  Trach collar as able Full support at night Continue volume  removal with HD VAP prevention See ID  CARDIOVASCULAR A:  Septic Shock -- resolved P:  Tele  RENAL A:  Acute Kidney Injury, ARF P:  HD pre renal> last session today Remove HD cath  GASTROINTESTINAL A:  Constipation on high dose narcotics, improved P:  Cont TF Bowel regimen  HEMATOLOGIC A:  Hx of sickle cell trait VTE  Anemia without bleeding P:  Continue SQ heparin  INFECTIOUS A:  Recurrent PNA with ARDS HCAP Apr 23, 2022, fever/WBC better 3/25 P:  Continue meropenem 7 days Follow resp culture Remove HD cath 3/25     ENDOCRINE  A:  Hyperglycemia  P:  Sliding scale insulin  NEUROLOGIC A:  Mentally impaired with limited vocabulary of 500 words Autism  Acute encephalopathy from metabolic disarray P:  RASS goal: 0 Versed, fentanyl drips, wean severe debiliation  Family: see note from 3/24  My cc time 31 minutes  Juan Sartell, MD [place] [facility] Pager: [phone] Cell: [phone] After 3pm or if no response, call [phone]  04/05/2015, 2:46 PM

## 2015-04-06 ENCOUNTER — Inpatient Hospital Stay (HOSPITAL_COMMUNITY): Payer: Medicare Other

## 2015-04-06 DIAGNOSIS — R5381 Other malaise: Secondary | ICD-10-CM

## 2015-04-06 LAB — CBC WITH DIFFERENTIAL/PLATELET
BASOS PCT: 0 %
Band Neutrophils: 10 %
Basophils Absolute: 0 10*3/uL (ref 0.0–0.1)
Blasts: 0 %
EOS PCT: 2 %
Eosinophils Absolute: 0.6 10*3/uL (ref 0.0–0.7)
HCT: 24.5 % — ABNORMAL LOW (ref 39.0–52.0)
Hemoglobin: 8.1 g/dL — ABNORMAL LOW (ref 13.0–17.0)
LYMPHS PCT: 9 %
Lymphs Abs: 2.8 10*3/uL (ref 0.7–4.0)
MCH: 28 pg (ref 26.0–34.0)
MCHC: 33.1 g/dL (ref 30.0–36.0)
MCV: 84.8 fL (ref 78.0–100.0)
MONO ABS: 0.9 10*3/uL (ref 0.1–1.0)
Metamyelocytes Relative: 3 %
Monocytes Relative: 3 %
Myelocytes: 1 %
NEUTROS PCT: 72 %
NRBC: 0 /100{WBCs}
Neutro Abs: 26.8 10*3/uL — ABNORMAL HIGH (ref 1.7–7.7)
OTHER: 0 %
PLATELETS: 169 10*3/uL (ref 150–400)
PROMYELOCYTES ABS: 0 %
RBC: 2.89 MIL/uL — ABNORMAL LOW (ref 4.22–5.81)
RDW: 17.5 % — ABNORMAL HIGH (ref 11.5–15.5)
WBC: 31.1 10*3/uL — ABNORMAL HIGH (ref 4.0–10.5)

## 2015-04-06 LAB — GLUCOSE, CAPILLARY
GLUCOSE-CAPILLARY: 111 mg/dL — AB (ref 65–99)
GLUCOSE-CAPILLARY: 122 mg/dL — AB (ref 65–99)
GLUCOSE-CAPILLARY: 110 mg/dL — AB (ref 65–99)
GLUCOSE-CAPILLARY: 124 mg/dL — AB (ref 65–99)
Glucose-Capillary: 130 mg/dL — ABNORMAL HIGH (ref 65–99)
Glucose-Capillary: 86 mg/dL (ref 65–99)

## 2015-04-06 LAB — BASIC METABOLIC PANEL
Anion gap: 11 (ref 5–15)
BUN: 51 mg/dL — AB (ref 6–20)
CALCIUM: 8.8 mg/dL — AB (ref 8.9–10.3)
CO2: 28 mmol/L (ref 22–32)
CREATININE: 2.09 mg/dL — AB (ref 0.61–1.24)
Chloride: 99 mmol/L — ABNORMAL LOW (ref 101–111)
GFR calc Af Amer: 45 mL/min — ABNORMAL LOW (ref >60)
GFR, EST NON AFRICAN AMERICAN: 39 mL/min — AB (ref >60)
GLUCOSE: 119 mg/dL — AB (ref 65–99)
Potassium: 3.8 mmol/L (ref 3.5–5.1)
Sodium: 138 mmol/L (ref 135–145)

## 2015-04-06 MED ORDER — CLONAZEPAM 0.5 MG PO TABS
1.0000 mg | ORAL_TABLET | Freq: Two times a day (BID) | ORAL | Status: DC
  Administered 2015-04-06 – 2015-04-07 (×3): 1 mg via ORAL
  Filled 2015-04-06 (×3): qty 2

## 2015-04-06 MED ORDER — FUROSEMIDE 10 MG/ML IJ SOLN: 100.0000 mg | Freq: Four times a day (QID) | INTRAVENOUS | Status: DC

## 2015-04-06 MED ORDER — FENTANYL CITRATE (PF) 100 MCG/2ML IJ SOLN
25.0000 ug | INTRAMUSCULAR | Status: DC | PRN
  Administered 2015-04-06 – 2015-04-07 (×4): 50 ug via INTRAVENOUS
  Filled 2015-04-06 (×4): qty 2

## 2015-04-06 MED ORDER — FUROSEMIDE 10 MG/ML IJ SOLN
120.0000 mg | Freq: Four times a day (QID) | INTRAMUSCULAR | Status: AC
Start: 2015-04-06 — End: 2015-04-06
  Administered 2015-04-06 (×2): 120 mg via INTRAVENOUS
  Filled 2015-04-06 (×2): qty 12

## 2015-04-06 MED ORDER — IPRATROPIUM-ALBUTEROL 0.5-2.5 (3) MG/3ML IN SOLN
3.0000 mL | Freq: Three times a day (TID) | RESPIRATORY_TRACT | Status: DC
  Administered 2015-04-07 (×3): 3 mL via RESPIRATORY_TRACT
  Filled 2015-04-06 (×3): qty 3

## 2015-04-06 NOTE — Progress Notes (Signed)
Patient ID: Juan Cameron, male   DOB: April 20, 1975, 40 y.o.   MRN: [id number]  [facility] Progress Note   Assessment/ Plan:   1. AKI- anuric: Suspected ATN from sepsis, remains anuric and on daily hemodialysis (because of his large body size/hypercatabolic state). Friday, I discussed with his sister [facility]) who was encouraged by the improvement of his mental status and was unwilling to believe that long-term dialysis would be negatively impact his QOL. The initial plan was to do a time limited trial of dialysis for 10 days however, she now wants to do this longer (even if it means to him being admitted to [facility]). Attempt diuretic challenge today-will need replacement of dialysis catheter tomorrow (being removed today as part of infection source control-febrile overnight). 2. Acute hypoxic respiratory failure/ARDS/HCAP/aspiration pneumonia: Remains ventilator dependent via tracheostomy. Now he is on meropenem monotherapy and corticosteroids. Continue efforts at weaning per CCM. 3. Septic shock: Leukocyte count improving with improved markers of sepsis-continue meropenem. 4. Altered mental status: Suspected multifactorial including from hypoxic respiratory failure/sepsis/uremia for which dialysis is being provided-he has improved but not back to baseline. (He has autism and baseline mental status is at about a 29-year-old with vocabulary of 500 words)  Subjective:   Febrile last night-apparently rash noted on right forearm.    Objective:   BP 98/54 mmHg  Pulse 107  Temp(Src) 98.4 F (36.9 C) (Oral)  Resp 17  Ht 5' 9"  (1.753 m)  Wt 104.1 kg (229 lb 8 oz)  BMI 33.88 kg/m2  SpO2 100%  Intake/Output Summary (Last 24 hours) at 04/06/15 0846 Last data filed at 04/06/15 0800  Gross per 24 hour  Intake   1501 ml  Output   2500 ml  Net   -999 ml   Weight change: 0.4 kg (14.1 oz)  Physical Exam: Gen: On ventilator via tracheostomy, does not respond to calling out his name CVS: Pulse  regular tachycardia, S1 and S2 normal Resp: Coarse bilaterally-no distinct rales or rhonchi Abd: Soft, obese, nontender Ext: 2+ lower extremity edema  Imaging: Dg Chest Port 1 View  04/05/2015  CLINICAL DATA:  40 year old male with a history of acute respiratory failure EXAM: PORTABLE CHEST 1 VIEW COMPARISON:  04/04/2015 FINDINGS: Cardiomediastinal silhouette unchanged in size and contour. Unchanged position of right IJ central venous catheter. Unchanged tracheostomy tube. Unchanged gastric tube. Low lung volumes persist with bilateral mixed interstitial and airspace opacities. No large consolidation. No pneumothorax is visualized. IMPRESSION: Low lung volumes remain, with the mixed opacities potentially atelectasis, mild edema, and/ or consolidation. Unchanged support apparatus. Signed, Dulcy Fanny. Earleen Newport, DO [facility] Rad[facility] Electronically Signed   By: Corrie Mckusick D.O.   On: 04/05/2015 08:14    Labs: BMET  Recent Labs Lab 03/30/15 1740 03/31/15 0423 04/01/15 0551 04/02/15 0415 04/03/15 0700 04/04/15 0454 04/05/15 0400 04/06/15 0405  NA 136 137 137 132* 135 135 135 138  K 5.0 5.0 4.7 4.7 5.2* 4.6 3.9 3.8  CL 100* 96* 97* 95* 95* 97* 96* 99*  CO2 26 25 25 26 25 26 27 28   GLUCOSE 298* 197* 121* 126* 125* 121* 124* 119*  BUN 43* 124* 115* 77* 92* 80* 60* 51*  CREATININE 1.33* 4.06* 3.32* 2.34* 2.73* 2.43* 2.04* 2.09*  CALCIUM 9.1 8.1* 8.1* 7.9* 8.3* 8.6* 8.6* 8.8*  PHOS 5.4*  --   --   --  11.0*  --   --   --    CBC  Recent Labs Lab  04/02/15 0415 04/03/15 0700 04/04/15 0454 04/05/15 0400 04/06/15 0405  WBC 50.3* 38.9* 41.8* 35.0* 31.1*  NEUTROABS 44.3*  --  37.6* 31.0* 26.8*  HGB 8.2* 7.5* 7.9* 7.8* 8.1*  HCT 24.5* 22.4* 23.9* 23.9* 24.5*  MCV 80.3 82.7 82.7 82.1 84.8  PLT 224 188 205 191 169    Medications:    . antiseptic oral rinse  7 mL Mouth Rinse QID  . chlorhexidine gluconate (SAGE KIT)  15 mL Mouth Rinse  BID  . clonazePAM  1 mg Oral BID  . docusate  100 mg Per Tube Daily  . feeding supplement (PRO-STAT SUGAR FREE 64)  30 mL Per Tube BID  . feeding supplement (VITAL HIGH PROTEIN)  1,000 mL Per Tube Q24H  . heparin subcutaneous  5,000 Units Subcutaneous 3 times per day  . insulin aspart  2-6 Units Subcutaneous 6 times per day  . ipratropium-albuterol  3 mL Nebulization Q4H  . meropenem (MERREM) IV  500 mg Intravenous Q24H  . pantoprazole sodium  40 mg Per Tube Q1200  . sodium chloride flush  3 mL Intravenous Q12H   Elmarie Shiley, MD 04/06/2015, 8:46 AM

## 2015-04-06 NOTE — Progress Notes (Signed)
eLink Physician-Brief Progress Note Patient Name: Juan RipperRobert Sangiovanni DOB: 07/09/1975 MRN: [id number]   Date of Service  04/06/2015  HPI/Events of Note  Rash - R forearm. Occlusive dermatitis vs drug eruption. Hard to determine etiology. No rash at any other site.   eICU Interventions  Will order: 1. Watch for progression.  2. Elevate R forearm on pillow.      Intervention Category Intermediate Interventions: Other:  [name] 04/06/2015, 3:16 AM

## 2015-04-06 NOTE — Progress Notes (Signed)
PULMONARY / CRITICAL CARE MEDICINE   Name: Juan Cameron MRN: [id number] DOB: 1975/04/16    ADMISSION DATE:  03/11/2015  CONSULTATION DATE: 2/28  REFERRING MD: [facility]  CHIEF COMPLAINT: Acute resp failure  BRIEF:  72 autistic male recently in hospital for multilobar pneumonia and dc'd on 6 l [place] and returned in 24 hours with sats in 50%. He was discharged on 03/10/15 after treatment for CAP, NOI, and was on 6 l . [facility]) checked sats and he was 50% and therefore was transported back to [facility] and required 80% fio2 on NIVMS with RR 45. He developed ARDS adn was intubated  SUBJECTIVE:  Trach collar yesterday Fever yesterday Rash overnight   VITAL SIGNS: BP 98/54 mmHg  Pulse 99  Temp(Src) 98.4 F (36.9 C) (Oral)  Resp 19  Ht  (1.753 m)  Wt 104.1 kg (229 lb 8 oz)  BMI 33.88 kg/m2  SpO2 100%  HEMODYNAMICS:    VENTILATOR SETTINGS: Vent Mode:  [-] PRVC FiO2 (%):  [40 %] 40 % Set Rate:  [22 bmp] 22 bmp Vt Set:  [400 mL] 400 mL PEEP:  [5 cmH20] 5 cmH20 Pressure Support:  [10 cmH20] 10 cmH20 Plateau Pressure:  [15 cmH20-19 cmH20] 15 cmH20  INTAKE / OUTPUT: I/O last 3 completed shifts: In: 2301.5 [I.V.:391.5; NG/GT:1910] Out: 2500 [Other:2500]  PHYSICAL EXAMINATION: General: awake on vent today HEENT: trach site c/d/i, NG in place PULM: CTA B today, vent supported breaths CV: RRR, no mgr GI: Belly soft, nontender MSK: normal bulk and tone Derm: mild purpuric rash R forearm Neuro: eyes open, staring ahead, no movement arms, legs  LABS:  BMET  Recent Labs Lab 04/04/15 0454 04/05/15 0400 04/06/15 0405  NA 135 135 138  K 4.6 3.9 3.8  CL 97* 96* 99*  CO2 BUN 80* 60* 51*  CREATININE 2.43* 2.04* 2.09*  GLUCOSE 121* 124* 119*   Electrolytes  Recent Labs Lab 03/30/15 1740  04/03/15 0700 04/04/15 0454 04/05/15 0400 04/06/15 0405  CALCIUM 9.1  < > 8.3* 8.6* 8.6* 8.8*  PHOS 5.4*  --  11.0*  --   --   --   < > = values in this  interval not displayed. CBC  Recent Labs Lab 04/04/15 0454 04/05/15 0400 04/06/15 0405  WBC 41.8* 35.0* 31.1*  HGB 7.9* 7.8* 8.1*  HCT 23.9* 23.9* 24.5*  PLT 205 191 169   Coag's No results for input(s): APTT, INR in the last 168 hours. Sepsis Markers No results for input(s): LATICACIDVEN, PROCALCITON, O2SATVEN in the last 168 hours. ABG  Recent Labs Lab 03/31/15 0431 03/31/15 1447 04/02/15 1004  PHART 7.346* 7.483* 7.247*  PCO2ART 56.1* 43.3 78.2*  PO2ART 114.0* 110.0* 133.0*   Liver Enzymes  Recent Labs Lab 03/30/15 1740 04/03/15 0700  ALBUMIN 1.7* 1.7*    Cardiac Enzymes No results for input(s): TROPONINI, PROBNP in the last 168 hours. Glucose  Recent Labs Lab 04/05/15 0904 04/05/15 1259 04/05/15 1639 04/05/15 2017 04/05/15 2337 04/06/15 0340  GLUCAP 108* 108* 113* 120* 130* 86   Imaging  3/25 CXR images personally reviewed>bilateral airspace disease unchanged, trach in place  CULTURES: 2/28 bc x 2 negative 2/28 uc negative 2/28 RVP negative Sputum culture 3/4, 3/5, 3/9 >>yeast Blood culture 3/4 >>  Blood 3/10 >  Resp culture 3/9 >  Urine 3/10 >   Resp culture 3/21 > yeast  ANTIBIOTICS: 2/28 vanc>>3/10 2/28 cefepime>>3/10 3/14 ceftaz>>>plan stop 3/21 3/14 vanc>>>3/16 3/14 anidulo>>>plan stop 21  3/21 vanc >> 3/24 3/21 meropenem >> 7 days  SIGNIFICANT EVENTS: 2/28 readmit to hospital  03/12/15 Intubated 3/1 Bronchoscopy  3/14- unable to ventilate or oxygenate> DNR 3/15- pos 6.8 liters 3/16- HD started  LINES/TUBES: Central line L IJ TLC>3/1 Trach 3/9>>> Rt ij hd 3/16>>> 3/26  ASSESSMENT / PLAN:  PULMONARY A: ARDS from severe CAP vs HCAP; Improving but still with significant lung injury New HCAP 3/21 Suspected undiagnosed OSA Pulm edema > improving P:  Trach collar as able Full support at night Continue volume removal with HD VAP prevention See ID  CARDIOVASCULAR A:  Septic Shock -- resolved P:   Tele  RENAL A:  Acute Kidney Injury, ARF P:  HD pre renal> last session 3/25  Remove HD cath  GASTROINTESTINAL A:  Constipation on high dose narcotics, improved P:  Cont TF Bowel regimen  HEMATOLOGIC A:  Hx of sickle cell trait VTE  Anemia without bleeding P:  Continue SQ heparin  INFECTIOUS A:  Recurrent PNA with ARDS HCAP 3/21,  WBC improving 3/26 but fever  P:  Continue meropenem 7 days Follow resp culture Remove HD cath 3/26 and monitor cultures Repeat blood cultures if febrile again  ENDOCRINE A:  Hyperglycemia  P:  Sliding scale insulin  NEUROLOGIC A:  Mentally impaired with limited vocabulary of 500 words Autism  Acute encephalopathy from metabolic disarray Neuromuscular weakness, profound P:  RASS goal: 0 Stop fentanyl and versed drips Start scheduled clonazepam, and fentanyl PT consult severe debiliation  Family: see note from 3/24  Consider transfer to SDU if stable  Heber CarolinaBrent McQuaid, MD [address] PCCM Pager: [phone] Cell: [phone] After 3pm or if no response, call [phone]  04/06/2015, 8:13 AM

## 2015-04-07 ENCOUNTER — Inpatient Hospital Stay (HOSPITAL_COMMUNITY): Payer: Medicare Other

## 2015-04-07 ENCOUNTER — Inpatient Hospital Stay
Admission: AD | Admit: 2015-04-07 | Discharge: 2015-05-06 | Disposition: A | Discharge status: Rehabilitation Facility | Payer: Medicare Other | Source: Other Acute Inpatient Hospital | Attending: Internal Medicine | Admitting: Internal Medicine

## 2015-04-07 DIAGNOSIS — T8249XA Other complication of vascular dialysis catheter, initial encounter: Secondary | ICD-10-CM

## 2015-04-07 DIAGNOSIS — Z9911 Dependence on respirator [ventilator] status: Secondary | ICD-10-CM

## 2015-04-07 DIAGNOSIS — F840 Autistic disorder: Secondary | ICD-10-CM | POA: Diagnosis present

## 2015-04-07 DIAGNOSIS — Z4659 Encounter for fitting and adjustment of other gastrointestinal appliance and device: Secondary | ICD-10-CM

## 2015-04-07 DIAGNOSIS — E669 Obesity, unspecified: Secondary | ICD-10-CM | POA: Diagnosis present

## 2015-04-07 DIAGNOSIS — N289 Disorder of kidney and ureter, unspecified: Secondary | ICD-10-CM

## 2015-04-07 DIAGNOSIS — Z930 Tracheostomy status: Secondary | ICD-10-CM

## 2015-04-07 DIAGNOSIS — Z95828 Presence of other vascular implants and grafts: Secondary | ICD-10-CM

## 2015-04-07 DIAGNOSIS — J9601 Acute respiratory failure with hypoxia: Secondary | ICD-10-CM | POA: Diagnosis present

## 2015-04-07 DIAGNOSIS — Z992 Dependence on renal dialysis: Secondary | ICD-10-CM

## 2015-04-07 DIAGNOSIS — J80 Acute respiratory distress syndrome: Secondary | ICD-10-CM | POA: Diagnosis present

## 2015-04-07 DIAGNOSIS — L8990 Pressure ulcer of unspecified site, unspecified stage: Secondary | ICD-10-CM | POA: Diagnosis present

## 2015-04-07 DIAGNOSIS — J9690 Respiratory failure, unspecified, unspecified whether with hypoxia or hypercapnia: Secondary | ICD-10-CM

## 2015-04-07 DIAGNOSIS — I5189 Other ill-defined heart diseases: Secondary | ICD-10-CM | POA: Diagnosis present

## 2015-04-07 DIAGNOSIS — S27309A Unspecified injury of lung, unspecified, initial encounter: Secondary | ICD-10-CM | POA: Diagnosis present

## 2015-04-07 DIAGNOSIS — J189 Pneumonia, unspecified organism: Secondary | ICD-10-CM | POA: Diagnosis present

## 2015-04-07 DIAGNOSIS — I519 Heart disease, unspecified: Secondary | ICD-10-CM | POA: Diagnosis present

## 2015-04-07 LAB — CBC WITH DIFFERENTIAL/PLATELET
Band Neutrophils: 3 %
Basophils Absolute: 0 10*3/uL (ref 0.0–0.1)
Basophils Relative: 0 %
Blasts: 0 %
EOS PCT: 4 %
Eosinophils Absolute: 1.3 10*3/uL — ABNORMAL HIGH (ref 0.0–0.7)
HCT: 24.9 % — ABNORMAL LOW (ref 39.0–52.0)
HEMOGLOBIN: 8.3 g/dL — AB (ref 13.0–17.0)
LYMPHS ABS: 0.7 10*3/uL (ref 0.7–4.0)
LYMPHS PCT: 2 %
MCH: 27.2 pg (ref 26.0–34.0)
MCHC: 33.3 g/dL (ref 30.0–36.0)
MCV: 81.6 fL (ref 78.0–100.0)
MONOS PCT: 2 %
MYELOCYTES: 2 %
Metamyelocytes Relative: 4 %
Monocytes Absolute: 0.7 10*3/uL (ref 0.1–1.0)
NEUTROS PCT: 83 %
NRBC: 0 /100{WBCs}
Neutro Abs: 29.8 10*3/uL — ABNORMAL HIGH (ref 1.7–7.7)
OTHER: 0 %
PLATELETS: 192 10*3/uL (ref 150–400)
Promyelocytes Absolute: 0 %
RBC: 3.05 MIL/uL — AB (ref 4.22–5.81)
RDW: 17.4 % — ABNORMAL HIGH (ref 11.5–15.5)
WBC: 32.5 10*3/uL — AB (ref 4.0–10.5)

## 2015-04-07 LAB — GLUCOSE, CAPILLARY
GLUCOSE-CAPILLARY: 120 mg/dL — AB (ref 65–99)
GLUCOSE-CAPILLARY: 105 mg/dL — AB (ref 65–99)
Glucose-Capillary: 132 mg/dL — ABNORMAL HIGH (ref 65–99)
Glucose-Capillary: 121 mg/dL — ABNORMAL HIGH (ref 65–99)
Glucose-Capillary: 97 mg/dL (ref 65–99)

## 2015-04-07 LAB — BASIC METABOLIC PANEL
ANION GAP: 15 (ref 5–15)
BUN: 93 mg/dL — ABNORMAL HIGH (ref 6–20)
CHLORIDE: 98 mmol/L — AB (ref 101–111)
CO2: 23 mmol/L (ref 22–32)
CREATININE: 3.12 mg/dL — AB (ref 0.61–1.24)
Calcium: 9 mg/dL (ref 8.9–10.3)
GFR calc non Af Amer: 24 mL/min — ABNORMAL LOW (ref >60)
GFR, EST AFRICAN AMERICAN: 28 mL/min — AB (ref >60)
Glucose, Bld: 130 mg/dL — ABNORMAL HIGH (ref 65–99)
POTASSIUM: 4.3 mmol/L (ref 3.5–5.1)
Sodium: 136 mmol/L (ref 135–145)

## 2015-04-07 MED ORDER — HEPARIN SODIUM (PORCINE) 5000 UNIT/ML IJ SOLN: 5000.0000 [IU] | Freq: Three times a day (TID) | INTRAMUSCULAR | Status: DC

## 2015-04-07 MED ORDER — PANTOPRAZOLE SODIUM 40 MG PO PACK: 40.0000 mg | PACK | Freq: Every day | ORAL | Status: DC

## 2015-04-07 MED ORDER — IPRATROPIUM-ALBUTEROL 0.5-2.5 (3) MG/3ML IN SOLN: 3.0000 mL | Freq: Three times a day (TID) | RESPIRATORY_TRACT | Status: DC

## 2015-04-07 MED ORDER — POLYETHYLENE GLYCOL 3350 17 G PO PACK: 17.0000 g | PACK | Freq: Every day | ORAL | Status: DC | PRN

## 2015-04-07 MED ORDER — FENTANYL CITRATE (PF) 100 MCG/2ML IJ SOLN: 25.0000 ug | INTRAMUSCULAR | Status: DC | PRN

## 2015-04-07 MED ORDER — INSULIN ASPART 100 UNIT/ML ~~LOC~~ SOLN: 2.0000 [IU] | SUBCUTANEOUS | Status: DC

## 2015-04-07 MED ORDER — CLONAZEPAM 1 MG PO TABS: 1.0000 mg | ORAL_TABLET | Freq: Two times a day (BID) | ORAL | Status: DC

## 2015-04-07 MED ORDER — ACETAMINOPHEN 160 MG/5ML PO SOLN: 650.0000 mg | ORAL | Status: DC | PRN

## 2015-04-07 MED ORDER — PRO-STAT SUGAR FREE PO LIQD: 30.0000 mL | Freq: Two times a day (BID) | ORAL | Status: DC

## 2015-04-07 MED ORDER — SODIUM CHLORIDE 0.9 % IV SOLN: 500.0000 mg | INTRAVENOUS | Status: DC

## 2015-04-07 MED ORDER — CHLORHEXIDINE GLUCONATE 0.12% ORAL RINSE (MEDLINE KIT): 15.0000 mL | Freq: Two times a day (BID) | OROMUCOSAL | Status: DC

## 2015-04-07 MED ORDER — DOCUSATE SODIUM 50 MG/5ML PO LIQD: 100.0000 mg | Freq: Every day | ORAL | Status: DC

## 2015-04-07 MED ORDER — VITAL HIGH PROTEIN PO LIQD: 1000.0000 mL | ORAL | Status: DC

## 2015-04-07 NOTE — Progress Notes (Signed)
Attempted report x1   Will call back in 5 minutes

## 2015-04-07 NOTE — Progress Notes (Signed)
Nutrition Follow-up  DOCUMENTATION CODES:   Obesity unspecified  INTERVENTION:  -Continue Vital High Protein via NGT @ 50 ml/h (1200 ml/d) with 30 ml Prostat BID, provides 1400 kcals, 135 grams of protein, and 1003 ml free water daily  -Recommend checking phosphorus lab in order to determine appropriate TF formula, last checked 3/23 phosphorus 11 mg/dL.   NUTRITION DIAGNOSIS:   Inadequate oral intake related to inability to eat as evidenced by NPO status.  -Ongoing  GOAL:   Provide needs based on ASPEN/SCCM guidelines  -Met  MONITOR:   Vent status, Labs, Weight trends, Skin, I & O's  ASSESSMENT:   40 yo autistic with limited vocabulary, recently admitted 2/17-2/27 for severe CAP.  CT scan negative for pulmonary embolism and showing bilateral lower lobe consolidation with patchy upper lobe infiltrates, rapid flu test was negative as well as urine strep antigen.  Echo showed diastolic dysfunction.  Pt had difficulty getting oxygen upon return home, his sister- POA, noted increasing hypoxia and brought him back to hospital.  3/1-Intubated 3/1-NG tube placed  3/16-HD started  3/25- Last HD, remove HD cath  MV: 14 mL  Temp (24hrs), Avg:99.8 F (37.7 C), Min:98.7 F (37.1 C), Max:101.4 F (38.6 C) Propofol: none  Medications reviewed.  Labs reviewed; phosphorus 11 (last checked 3/23)    Diet Order:  Diet NPO time specified  Skin:  Wound (see comment) (Stage II Pressure Injury on neck )  Last BM:  03/31/2015  Height:   Ht Readings from Last 1 Encounters:  03/25/15 _0  (1.753 m)    Weight:   Wt Readings from Last 1 Encounters:  04/07/15 231 lb 0.7 oz (104.8 kg)    Ideal Body Weight:  67.3 kg  BMI:  Body mass index is 34.1 kg/(m^2).  Estimated Nutritional Needs:   Kcal:  8366-2947  Protein:  >/= 135 grams  Fluid:  1.5 L  EDUCATION NEEDS:   No education needs identified at this time  Raford Pitcher, Dietetic Intern Pager: [phone]

## 2015-04-07 NOTE — Discharge Summary (Signed)
Physician Discharge Summary  Patient ID: Juan Cameron MRN: [id number] DOB/AGE: Jan 06, 1976 40 y.o.  Admit date: 03/11/2015 Discharge date: 04/07/2015  BRIEF:  47 autistic male recently in hospital for multilobar pneumonia and dc'd on 6 l [place] and returned in 24 hours with sats in 50%. He was discharged on 03/10/15 after treatment for CAP, NOI, and was on 6 l [place]. [facility]) checked sats and he was 50% and therefore was transported back to [facility] and required 80% fio2 on NIVMS with RR 45. He developed ARDS and  was intubated. Patient was started on broad spectrum antibiotics.  Patient was worsening over the course of treatment and developed Acute kidney injury requiring dialysis. Patient was  Recommended to have short term dialysis for renal recovery for ten days that is from 3/16 -3/26.  Patient was trached on 3/9,was treated with antifungals in the trach.  Patient continued to be febrile and  showing very little sign of improvement requiring long term antibiotics and vent management, therefore spoke to the sister and will be transferred to [facility].  Goals of care were discussed with the family, family is also well aware of poor prognosis, full code as well as al other treatment options are considered for now therefore the patient requires LTACH management.  Discharge Diagnoses:  1. Acute Kidney Injury,Hypoperfusion 2.Respiratory failure 3.  Multilobar PNA 4. Altered Mental Status chronic and acute 5. Obesity                                                                    DISCHARGE PLAN BY DIAGNOSIS       ARDS from severe CAP vs HCAP; Improving but still with significant lung injury New HCAP 3/21 Suspected undiagnosed OSA Pulm edema > improving -Trach collar as able -Full support at night -Continue volume removal with HD once catheter can be placed (plan for 3/27) -VAP prevention  Acute Kidney injury Hypoperfusion No recovery evident -Baseline Cr 1.2 -No acute interventions needed  today.Hold off on the line(HD) until tomorrow due to fever and leukocytosis. Will place -HD catheter tomorrow. -Dialysis short term only   Constipation on high dose narcotics, improved -Cont TF -Bowel regimen  Hx of sickle cell trait VTE  Anemia without bleeding -Continue SQ heparin  Recurrent PNA with ARDS HCAP 3/21,  WBC improving 3/26 but fever -Continue meropenem 7 days (3/28 stop) -Follow resp culture -Remove HD cath 3/26 and monitor cultures -Repeat blood cultures if febrile again  Hyperglycemia  -Sliding scale insulin  Mentally impaired with limited vocabulary of 500 words Autism  Acute encephalopathy from metabolic disarray Neuromuscular weakness, profound -RASS goal: 0 -hold sedation -scheduled clonazepam, and fentanyl -PT as tolerated   SIGNIFICANT DIAGNOSTIC STUDIES 3/14 duplex of Lower extremities>Negative  SIGNIFICANT EVENTS: 2/28 readmit to hospital  03/12/15 Intubated 3/1 Bronchoscopy  3/14- unable to ventilate or oxygenate> DNR 3/15- pos 6.8 liters 3/16- HD started  MICRO DATA  2/28 bc x 2 negative 2/28 uc negative 2/28 RVP negative Sputum culture 3/4, 3/5, 3/9 >>yeast Blood culture 3/4 >>  Blood 3/10 >  Resp culture 3/9 >  Urine 3/10 >   Resp culture 3/21 > yeast   ANTIBIOTICS 2/28 vanc>>3/10 2/28 cefepime>>3/10 3/14 ceftaz>>>plan stop 3/21 3/14 vanc>>>3/16 3/14 anidulo>>>plan stop 21  3/21  vanc >> 3/24 3/21 meropenem >> 7 days  CONSULTS  3/16-Nephrology 3/10 - ID 3/1 - Critical care  TUBES / LINES Central line L IJ TLC>3/1 Trach 3/9>>> Rt ij hd 3/16>>> 3/26    Discharge Exam: General: sickly appearing quad Neuro: Obtunded HEENT: [place]/AT conjugate pupils, no discharge  Cardiovascular:  S1S2,RRR, no M/R/G.  Lungs: Coarse bilaterally,no rhonhi, wheeze, crackles noted Abdomen:firm, obese, rounded, positive bowel sounds. Musculoskeletal:, no deformity noted, noresponse to painful stimuli Skin: Pressure ulcer  on sacrum, rashes on the arms,as well as chest   Filed Vitals:   04/07/15 1100 04/07/15 1127 04/07/15 1208 04/07/15 1448  BP: 119/76 119/76  125/63  Pulse: 103 103  97  Temp:   98.2 F (36.8 C)   TempSrc:   Rectal   Resp: _0 Height:      Weight:      SpO2: 100% 100%  100%     Discharge Labs  BMET  Recent Labs Lab 04/03/15 0700 04/04/15 0454 04/05/15 0400 04/06/15 0405 04/07/15 0445  NA 135 135 135 138 136  K 5.2* 4.6 3.9 3.8 4.3  CL 95* 97* 96* 99* 98*  CO2 _1 GLUCOSE 125* 121* 124* 119* 130*  BUN 92* 80* 60* 51* 93*  CREATININE 2.73* 2.43* 2.04* 2.09* 3.12*  CALCIUM 8.3* 8.6* 8.6* 8.8* 9.0  PHOS 11.0*  --   --   --   --     CBC  Recent Labs Lab 04/05/15 0400 04/06/15 0405 04/07/15 0445  HGB 7.8* 8.1* 8.3*  HCT 23.9* 24.5* 24.9*  WBC 35.0* 31.1* 32.5*  PLT 191 169 192    Anti-Coagulation No results for input(s): INR in the last 168 hours.      Medication List    STOP taking these medications        guaiFENesin 600 MG 12 hr tablet  Commonly known as:  MUCINEX     predniSONE 20 MG tablet  Commonly known as:  DELTASONE      TAKE these medications        acetaminophen 160 MG/5ML solution  Commonly known as:  TYLENOL  Place 20.3 mLs (650 mg total) into feeding tube every 4 (four) hours as needed for mild pain, headache or fever.     albuterol (2.5 MG/3ML) 0.083% nebulizer solution  Commonly known as:  PROVENTIL  Take 2.5 mg by nebulization every 6 (six) hours as needed for wheezing or shortness of breath.     chlorhexidine gluconate (SAGE KIT) 0.12 % solution  Commonly known as:  PERIDEX  15 mLs by Mouth Rinse route 2 (two) times daily.     clonazePAM 1 MG tablet  Commonly known as:  KLONOPIN  Take 1 tablet (1 mg total) by mouth 2 (two) times daily.     docusate 50 MG/5ML liquid  Commonly known as:  COLACE  Place 10 mLs (100 mg total) into feeding tube daily.     feeding supplement (PRO-STAT SUGAR FREE 64) Liqd   Place 30 mLs into feeding tube 2 (two) times daily.     feeding supplement (VITAL HIGH PROTEIN) Liqd liquid  Place 1,000 mLs into feeding tube daily.     fentaNYL 100 MCG/2ML injection  Commonly known as:  SUBLIMAZE  Inject 0.5-1 mLs (25-50 mcg total) into the vein every 2 (two) hours as needed for severe pain.     heparin 5000 UNIT/ML injection  Inject 1 mL (5,000 Units total) into the skin  every 8 (eight) hours.     insulin aspart 100 UNIT/ML injection  Commonly known as:  novoLOG  Inject 2-6 Units into the skin every 4 (four) hours.     ipratropium-albuterol 0.5-2.5 (3) MG/3ML Soln  Commonly known as:  DUONEB  Take 3 mLs by nebulization 3 (three) times daily.     meropenem 500 mg in sodium chloride 0.9 % 50 mL  Inject 500 mg into the vein daily.     pantoprazole sodium 40 mg/20 mL Pack  Commonly known as:  PROTONIX  Place 20 mLs (40 mg total) into feeding tube daily at 12 noon.     polyethylene glycol packet  Commonly known as:  MIRALAX / GLYCOLAX  Take 17 g by mouth daily as needed for moderate constipation.         Disposition:[facility]  Discharged Condition: Juan Cameron is intubated on vent , requires longer course of antibiotics due to multilobar PNA  He also would require dialysis due to AKI so the patient will be discharged to [facility]. Prognosis poor, little chance of meaningful neurologic recovery.   Time spent on disposition:  Greater than 45 minutes.   Bincy Varughese,AG-ACNP Pulmonary & Critical Care   STAFF NOTE: I, Merrie Roof, MD FACP have personally reviewed patient's available data, including medical history, events of note, physical examination and test results as part of my evaluation. I have discussed with resident/NP and other care providers such as pharmacist, RN and RRT. In addition, I personally evaluated patient and elicited key findings of:  Not following commands, trach collar without distress, lungs coarse, fevers noted, wbc noted, line  removal did not change fever curve, ID following, LIne will delay till am for HD catheter, may need CT chest / abdo/pelvis, consider MRSa covarage / enteroccus, to [facility]  Lavon Paganini. Titus Mould, MD, [place] Pgr: [place] [facility] 04/07/2015 4:59 PM

## 2015-04-07 NOTE — Progress Notes (Signed)
Subjective: Interval History: ? Made a little urine yest.  Objective: Vital signs in last 24 hours: Temp:  [98.7 F (37.1 C)-101.4 F (38.6 C)] 100.8 F (38.2 C) (03/27 0605) Pulse Rate:  [106-133] 117 (03/27 0836) Resp:  [15-37] 31 (03/27 0836) BP: (117-180)/(66-93) 145/72 mmHg (03/27 0836) SpO2:  [97 %-100 %] 100 % (03/27 0836) FiO2 (%):  [40 %] 40 % (03/27 0836) Weight:  [104.8 kg (231 lb 0.7 oz)] 104.8 kg (231 lb 0.7 oz) (03/27 0500) Weight change: -3 kg (-6 lb 9.8 oz)  Intake/Output from previous day: 03/26 0701 - 03/27 0700 In: 1497 [I.V.:235; NG/GT:1200; IV Piggyback:62] Out: 1 [Stool:1] Intake/Output this shift:    General appearance: moderately obese, uncooperative and just lies in bed, trach off vent Neck: trach Resp: diminished breath sounds bilat, rales and rhonchi in bases Cardio: S1, S2 normal GI: obese, pos bs, liverdown 6 cm Extremities: edema 2+  Lab Results:  Recent Labs  04/06/15 0405 04/07/15 0445  WBC 31.1* 32.5*  HGB 8.1* 8.3*  HCT 24.5* 24.9*  PLT 169 192   BMET:  Recent Labs  04/06/15 0405 04/07/15 0445  NA 138 136  K 3.8 4.3  CL 99* 98*  CO2 28 23  GLUCOSE 119* 130*  BUN 51* 93*  CREATININE 2.09* 3.12*  CALCIUM 8.8* 9.0   No results for input(s): PTH in the last 72 hours. Iron Studies: No results for input(s): IRON, TIBC, TRANSFERRIN, FERRITIN in the last 72 hours.  Studies/Results: Dg Chest Port 1 View  04/07/2015  CLINICAL DATA:  Acute respiratory failure with hypoxemia EXAM: PORTABLE CHEST 1 VIEW COMPARISON:  Portable chest x-ray of April 06, 2015 FINDINGS: The tracheostomy appliance hips lies at the level of the inferior margin of the clavicular heads. The lungs are reasonably well inflated. The interstitial markings remain increased in the left mid and lower lung and at the right lung base but have improved overall. The cardiac silhouette is top-normal in size. The pulmonary vascularity is not engorged. The nasogastric tube  tip projects below the inferior margin of the image. The right internal jugular Cordis sheath has been removed. IMPRESSION: Mild interval improvement in bibasilar atelectasis. The support tubes are in stable position. Electronically Signed   By: David  SwazilandJordan M.D.   On: 04/07/2015 07:22   Dg Chest Port 1 View  04/06/2015  CLINICAL DATA:  Acute respiratory failure, hypoxemia EXAM: PORTABLE CHEST 1 VIEW COMPARISON:  04/05/2015 FINDINGS: Cardiomediastinal silhouette is stable. Stable right IJ central line with tip in SVC. Tracheostomy tube is unchanged in position. No convincing pulmonary edema. No segmental infiltrate. Mild basilar atelectasis. NG tube is unchanged in position. IMPRESSION: Stable tracheostomy and NG tube position. No convincing pulmonary edema. No segmental infiltrate. Mild basilar atelectasis. Electronically Signed   By: Natasha MeadLiviu  Pop M.D.   On: 04/06/2015 09:30    I have reviewed the patient's current medications.  Assessment/Plan: 1 AKI hypo perfusion and Vanc . No recovery evident. Baseline over a mon ago 1.2.  No acute need for intervention today and with fever hold off line until tomorrow.  Social/chronic situation strong consideration 2 Resp failure 3 Pneu 4 obesity 5 AMS chronic and acute. 6 Nutrition TF P follow chem, bladder scan, access tomorrow if not better.    LOS: 27 days   Glenford Garis L 04/07/2015,9:00 AM

## 2015-04-07 NOTE — Progress Notes (Signed)
Pt transported to [facility] with 2 nurses, RT, and sister [name] without incident. VS WNL.

## 2015-04-07 NOTE — Care Management Note (Addendum)
Case Management Note  Patient Details  Name: Juan RipperRobert Cameron MRN: [id number] Date of Birth: [birth year]  Subjective/Objective:  Pt admitted from home with Respiratory Distress. Pt lives with sister - baseline lower functioning autistic.                  Action/Plan: 04/07/2015 [name], per attending pt is ready for discharge today.  [facility] actively working on admitting pt today.  04/04/15 Attending spoke with sister [name] regarding prognosis and goals of care.  Sister is still interested in HD.  Attending will reassess/readdress perm HD catheter placement on Monday 04/07/15.  CM spoke with sister about both LTACH choices, [facility].  [facility] and CSW following for disposition.  03/21/15 Pt spoke in depth with sister regarding possible need for SNF  post discharge, sister is interested and CSW actively working placement.    03/20/15 Pt trached.  CM spoke with physician advisor; pt not appropriate for LTACH at this time.  CSW following pt for SNF placement.  CM will continue to monitor for disposition needs   03/14/15 Pt in on ventilator  Pt is recent admission that was d/c home with 02 via [facility] on 4L via [place]. [facility] once stable for [facility]/c. Plan for transfer to unit. CM to continue to monitor for additional needs.    Expected Discharge Date:                  Expected Discharge Plan:  Home w Home Health Services  In-House Referral:  NA  Discharge planning Services  CM Consult  Post Acute Care Choice:    Choice offered to:     DME Arranged:    DME Agency:     HH Arranged:    HH Agency:     Status of Service:  Completed, signed off  Medicare Important Message Given:  Yes Date Medicare IM Given:    Medicare IM give by:    Date Additional Medicare IM Given:    Additional Medicare Important Message give by:     If discussed at Long Length of Stay Meetings, dates discussed:    Additional  Comments: [facility]  Cherylann ParrClaxton, Miciah Covelli S, RN 04/07/2015, 7:34 PM

## 2015-04-07 NOTE — Progress Notes (Signed)
This RN noted in patients chart documented Stage II pressure ulcer under patients rectum. This RN was taking care of the patient on Tuesday of last week when Nurse Tech and I removed a rectal pouch which caused a small tear under the patients rectum. This wound is not over a bony prominence. Will make correction as needed to chart. Director notified.

## 2015-04-07 NOTE — Progress Notes (Signed)
Level 2 PASRR obtained on 03/20/2015 will expire on 04/19/2015  PASRR # [id number]  CSW will continue to follow for disposition.    Noe GensAshley Gardner, MSW, LCSW [facility] Clinical Social Worker [phone]

## 2015-04-07 NOTE — Care Management Important Message (Signed)
Important Message  Patient Details  Name: Juan Cameron MRN: [id number] Date of Birth: May 18, 1975   Medicare Important Message Given:  Yes    Bernadette HoitShoffner, Ellean Firman Coleman 04/07/2015, 12:38 PM

## 2015-04-07 NOTE — Progress Notes (Signed)
Patient placed on 40% Venturi for transport to [facility].  Transported with RN x2 without incident.

## 2015-04-07 NOTE — Evaluation (Signed)
Physical Therapy Evaluation Patient Details Name: Juan Cameron MRN: [id number] DOB: 13-Dec-1975 Today's Date: 04/07/2015   History of Present Illness  Patient is a 40 y/o male with hx of Autism, Vardaman trait who presents with Acute respiratory failure likely from CHF decompensation and HCAP. CXR consistent early ARDS. Intubated 3/1, and now with trach collar 3/10.  Clinical Impression  Patient only able to respond to generalized verbal stimuli when cousin states pt's name. Able to gaze to the right but not able to gaze left today, only able to get to midline. Not able to localize verbal or tactile stimuli or respond to noxious stimulus in any extremity. Pt does not have any volitional muscle activation in any extremity. Pt appropriate for LTACH or SNF level of care at this time. Education to sister in room re: PROM all extremities to prevent contractures and importance of positioning to prevent sores. Will sign off for now until pt more appropriate for therapy.     Follow Up Recommendations Supervision/Assistance - 24 hour;LTACH    Equipment Recommendations  Other (comment) (TBD)    Recommendations for Other Services       Precautions / Restrictions Precautions Precaution Comments: trach collar Restrictions Weight Bearing Restrictions: No      Mobility  Bed Mobility               General bed mobility comments: Total A for all PROM througbout all extremities. No bed mobility performed.   Transfers                    Ambulation/Gait                Stairs            Wheelchair Mobility    Modified Rankin (Stroke Patients Only)       Balance Overall balance assessment:  (Not formally assessed.)                                           Pertinent Vitals/Pain Pain Assessment: Faces Faces Pain Scale: No hurt    Home Living Family/patient expects to be discharged to:: Private residence Living Arrangements: Other relatives (with  sister)   Type of Home: House         Home Equipment: None      Prior Function Level of Independence: Independent         Comments: very active. Does bowling, swimming, activities with group home.     Hand Dominance        Extremity/Trunk Assessment   Upper Extremity Assessment: RUE deficits/detail;LUE deficits/detail RUE Deficits / Details: PROM WFL throughout, no active ROM noted.         Lower Extremity Assessment: RLE deficits/detail;LLE deficits/detail RLE Deficits / Details: PROM WFL throughout, no active ROM/muscle activation noted. LLE Deficits / Details: PROM WFL throughout, no active ROM/muscle activation noted.     Communication   Communication: Tracheostomy  Cognition Arousal/Alertness: Lethargic Behavior During Therapy: Flat affect Overall Cognitive Status: Difficult to assess                      General Comments      Exercises Low Level/ICU Exercises Ankle Circles/Pumps: PROM;Both;10 reps;Supine Hip ABduction/ADduction: Both;10 reps;Supine;PROM Heel Slides: PROM;Both;10 reps;Supine Shoulder Flexion: PROM;Both;10 reps Elbow Flexion: PROM;Both;10 reps;Supine      Assessment/Plan    PT Assessment  All further PT needs can be met in the next venue of care  PT Diagnosis Generalized weakness   PT Problem List Decreased range of motion;Decreased strength;Cardiopulmonary status limiting activity;Decreased cognition;Decreased mobility  PT Treatment Interventions     PT Goals (Current goals can be found in the Care Plan section) Acute Rehab PT Goals Patient Stated Goal: none stated as pt not able too PT Goal Formulation: Patient unable to participate in goal setting Time For Goal Achievement: 04/21/15 Potential to Achieve Goals: Poor    Frequency     Barriers to discharge        Co-evaluation               End of Session Equipment Utilized During Treatment: Other (comment) (trach) Activity Tolerance: Patient tolerated  treatment well Patient left: in bed;with call bell/phone within reach;with family/visitor present Nurse Communication: Mobility status;Need for lift equipment         Time: 1148-1200 PT Time Calculation (min) (ACUTE ONLY): 12 min   Charges:   PT Evaluation $PT Eval High Complexity: 1 Procedure     PT G Codes:        Aubriee Szeto A Katleen Carraway 04/07/2015, 1:37 PM Wray Kearns, [place], DPT [phone]

## 2015-04-08 DIAGNOSIS — Z9911 Dependence on respirator [ventilator] status: Secondary | ICD-10-CM

## 2015-04-08 DIAGNOSIS — Z930 Tracheostomy status: Secondary | ICD-10-CM

## 2015-04-08 DIAGNOSIS — J9601 Acute respiratory failure with hypoxia: Secondary | ICD-10-CM

## 2015-04-08 NOTE — Consult Note (Signed)
Name: Juan Cameron MRN: [id number] DOB: 1975/01/20    ADMISSION DATE:  04/07/2015 CONSULTATION DATE:  3/28  REFERRING MD :[facility]  CHIEF COMPLAINT:Vent magagement  BRIEF PATIENT DESCRIPTION: 40 yo aa m  SIGNIFICANT EVENTS   SIGNIFICANT EVENTS: 2/28 readmit to hospital  03/12/15 Intubated 3/1 Bronchoscopy  3/14- unable to ventilate or oxygenate> DNR 3/15- pos 6.8 liters 3/16- HD started  LINES/TUBES: Central line L IJ TLC>3/1 Trach 3/9>>> Rt ij hd 3/16>>> 3/26   CULTURES: 2/28 bc x 2 negative 2/28 uc negative 2/28 RVP negative Sputum culture 3/4, 3/5, 3/9 >>yeast Blood culture 3/4 >>  Blood 3/10 > neg Resp culture 3/9 > yeast Urine 3/10 > ng  Resp culture 3/21 > yeast  ANTIBIOTICS: 2/28 vanc>>3/10 2/28 cefepime>>3/10 3/14 ceftaz>>>plan stop 3/21 3/14 vanc>>>3/16 3/14 anidulo>>>plan stop 21  3/21 vanc >> 3/24 3/21 meropenem >> 7 days  SIGNIFICANT EVENTS: 2/28 readmit to hospital  03/12/15 Intubated 3/1 Bronchoscopy  3/14- unable to ventilate or oxygenate> DNR 3/15- pos 6.8 liters 3/16- HD started     HISTORY OF PRESENT ILLNESS:   40 yo autistic male who was admitted to [facility] hospital 03/11/15 with resp distress secondary to pneumonia. He proved refractory to abx and conservative treatment and was moved to ICU 3/01 with worsening pulmonary mechanics and required intubation 3/01, tracheostomy 3/9, HD cath 3/16 and agressive antibiotics, HD support and renal interventions. Despite aggressive treatment his chances of any meaningful recovery are almost non existent. He was transferred to [facility] 3/27 and PCCM asked to manage the ventilator.  PAST MEDICAL HISTORY :   has a past medical history of Autism; Sickle cell trait ([place]); Pneumonia (02/2015); and Autism.  has past surgical history that includes No past surgeries. Prior to Admission medications   Medication Sig Start Date End Date Taking? Authorizing Provider  acetaminophen (TYLENOL) 160 MG/5ML solution  Place 20.3 mLs (650 mg total) into feeding tube every 4 (four) hours as needed for mild pain, headache or fever. 04/07/15   Bincy S Varughese, NP  albuterol (PROVENTIL) (2.5 MG/3ML) 0.083% nebulizer solution Take 2.5 mg by nebulization every 6 (six) hours as needed for wheezing or shortness of breath.    Historical Provider, MD  Amino Acids-Protein Hydrolys (FEEDING SUPPLEMENT, PRO-STAT SUGAR FREE 64,) LIQD Place 30 mLs into feeding tube 2 (two) times daily. 04/07/15   Bincy S Varughese, NP  chlorhexidine gluconate, SAGE KIT, (PERIDEX) 0.12 % solution 15 mLs by Mouth Rinse route 2 (two) times daily. 04/07/15   Bincy S Varughese, NP  clonazePAM (KLONOPIN) 1 MG tablet Take 1 tablet (1 mg total) by mouth 2 (two) times daily. 04/07/15   Bincy S Varughese, NP  docusate (COLACE) 50 MG/5ML liquid Place 10 mLs (100 mg total) into feeding tube daily. 04/07/15   Bincy S Varughese, NP  fentaNYL (SUBLIMAZE) 100 MCG/2ML injection Inject 0.5-1 mLs (25-50 mcg total) into the vein every 2 (two) hours as needed for severe pain. 04/07/15   Bincy S Varughese, NP  heparin 5000 UNIT/ML injection Inject 1 mL (5,000 Units total) into the skin every 8 (eight) hours. 04/07/15   Bincy S Varughese, NP  insulin aspart (NOVOLOG) 100 UNIT/ML injection Inject 2-6 Units into the skin every 4 (four) hours. 04/07/15   Bincy S Varughese, NP  ipratropium-albuterol (DUONEB) 0.5-2.5 (3) MG/3ML SOLN Take 3 mLs by nebulization 3 (three) times daily. 04/07/15   Bincy S Varughese, NP  meropenem 500 mg in sodium chloride 0.9 % 50 mL Inject 500 mg into the vein  daily. 04/07/15   Holley Raring, NP  Nutritional Supplements (FEEDING SUPPLEMENT, VITAL HIGH PROTEIN,) LIQD liquid Place 1,000 mLs into feeding tube daily. 04/07/15   Bincy S Varughese, NP  pantoprazole sodium (PROTONIX) 40 mg/20 mL PACK Place 20 mLs (40 mg total) into feeding tube daily at 12 noon. 04/07/15   Bincy S Varughese, NP  polyethylene glycol (MIRALAX / GLYCOLAX) packet Take 17 g by  mouth daily as needed for moderate constipation. 04/07/15   Holley Raring, NP   Allergies  Allergen Reactions  . Peanuts [Peanut Oil] Anaphylaxis    FAMILY HISTORY:  family history includes Heart attack in his mother. SOCIAL HISTORY:  reports that he has never smoked. He has never used smokeless tobacco. He reports that he does not drink alcohol or use illicit drugs.  REVIEW OF SYSTEMS:   Sedated on vent SUBJECTIVE:  Sedated on vent VITAL SIGNS: 97.9 125/74 103 99% 27 PHYSICAL EXAMINATION: General: Obese AAM on vent Neuro:  No follows commands at this time HEENT:  Trach-> vent Cardiovascular:  HSR RRR Lungs:  Mild rhonchi Abdomen:  Soft  Musculoskeletal:  intact Skin:  Warm and dry   Recent Labs Lab 04/05/15 0400 04/06/15 0405 04/07/15 0445  NA 135 138 136  K 3.9 3.8 4.3  CL 96* 99* 98*  CO2 _0 BUN 60* 51* 93*  CREATININE 2.04* 2.09* 3.12*  GLUCOSE 124* 119* 130*    Recent Labs Lab 04/05/15 0400 04/06/15 0405 04/07/15 0445  HGB 7.8* 8.1* 8.3*  HCT 23.9* 24.5* 24.9*  WBC 35.0* 31.1* 32.5*  PLT 191 169 192   Dg Chest Port 1 View  04/07/2015  CLINICAL DATA:  Acute respiratory failure with hypoxemia EXAM: PORTABLE CHEST 1 VIEW COMPARISON:  Portable chest x-ray of April 06, 2015 FINDINGS: The tracheostomy appliance hips lies at the level of the inferior margin of the clavicular heads. The lungs are reasonably well inflated. The interstitial markings remain increased in the left mid and lower lung and at the right lung base but have improved overall. The cardiac silhouette is top-normal in size. The pulmonary vascularity is not engorged. The nasogastric tube tip projects below the inferior margin of the image. The right internal jugular Cordis sheath has been removed. IMPRESSION: Mild interval improvement in bibasilar atelectasis. The support tubes are in stable position. Electronically Signed   By: David  Martinique M.D.   On: 04/07/2015 07:22     ASSESSMENT:     Acute respiratory failure with hypoxia (HCC)   CAP (community acquired pneumonia)   Autism   Acute lung injury   Left ventricular diastolic dysfunction, NYHA class 2   ARDS (adult respiratory distress syndrome) (HCC)   Acute renal insufficiency   Obesity   Pressure ulcer   Tracheostomy dependent (HCC)   Ventilator dependence [facility])  Discussion:  40 yo autistic male who was admitted to [facility] hospital 03/11/15 with resp distress secondary to pneumonia. He proved refractory to abx and conservative treatment and was moved to ICU 3/01 with worsening pulmonary mechanics and required intubation 3/01, tracheostomy 3/9, HD cath 3/16 and agressive antibiotics, HD support and renal interventions. Despite aggressive treatment his chances of any meaningful recovery are almost non existent. He was transferred to [facility] 3/27 and PCCM asked to manage the ventilator.   PLAN:   PULMONARY A: ARDS from severe CAP vs HCAP; Improving but still with significant lung injury New HCAP 3/21 Suspected undiagnosed OSA Pulm edema > improving P:  Trach collar as  able Full support at night Continue volume removal with HD VAP prevention See ID  CARDIOVASCULAR A:  Septic Shock -- resolved P:  Tele  RENAL A:  Acute Kidney Injury, ARF P:  Per renal GASTROINTESTINAL A:  Constipation on high dose narcotics, improved P:  Per ssh  HEMATOLOGIC A:  Hx of sickle cell trait VTE  Anemia without bleeding P:  Continue SQ heparin  INFECTIOUS A:  Recurrent PNA with ARDS HCAP 3/21,  WBC improving 3/26 but fever  P:  Abx per ssh  ENDOCRINE A:  Hyperglycemia  P:  Sliding scale insulin  NEUROLOGIC A:  Mentally impaired with limited vocabulary of 500 words Autism  Acute encephalopathy from metabolic disarray Neuromuscular weakness, profound P:  Sedation per ssh  Richardson Landry Minor ACNP Maryanna Shape PCCM Pager [phone] till 3 pm If no answer page  [phone] 04/08/2015, 9:27 AM

## 2015-04-09 ENCOUNTER — Other Ambulatory Visit (HOSPITAL_COMMUNITY): Payer: Self-pay

## 2015-04-09 LAB — COMPREHENSIVE METABOLIC PANEL
ALBUMIN: 1.8 g/dL — AB (ref 3.5–5.0)
ALT: 148 U/L — ABNORMAL HIGH (ref 17–63)
ANION GAP: 17 — AB (ref 5–15)
AST: 80 U/L — ABNORMAL HIGH (ref 15–41)
Alkaline Phosphatase: 77 U/L (ref 38–126)
BILIRUBIN TOTAL: 0.7 mg/dL (ref 0.3–1.2)
BUN: 150 mg/dL — ABNORMAL HIGH (ref 6–20)
CO2: 22 mmol/L (ref 22–32)
Calcium: 8.6 mg/dL — ABNORMAL LOW (ref 8.9–10.3)
Chloride: 97 mmol/L — ABNORMAL LOW (ref 101–111)
Creatinine, Ser: 4.67 mg/dL — ABNORMAL HIGH (ref 0.61–1.24)
GFR, EST AFRICAN AMERICAN: 17 mL/min — AB (ref >60)
GFR, EST NON AFRICAN AMERICAN: 15 mL/min — AB (ref >60)
GLUCOSE: 105 mg/dL — AB (ref 65–99)
POTASSIUM: 4.3 mmol/L (ref 3.5–5.1)
Sodium: 136 mmol/L (ref 135–145)
TOTAL PROTEIN: 4.9 g/dL — AB (ref 6.5–8.1)

## 2015-04-09 LAB — CBC
HEMATOCRIT: 23.4 % — AB (ref 39.0–52.0)
Hemoglobin: 7.6 g/dL — ABNORMAL LOW (ref 13.0–17.0)
MCH: 26.1 pg (ref 26.0–34.0)
MCHC: 32.5 g/dL (ref 30.0–36.0)
MCV: 80.4 fL (ref 78.0–100.0)
Platelets: 167 10*3/uL (ref 150–400)
RBC: 2.91 MIL/uL — ABNORMAL LOW (ref 4.22–5.81)
RDW: 16.5 % — AB (ref 11.5–15.5)
WBC: 19.1 10*3/uL — ABNORMAL HIGH (ref 4.0–10.5)

## 2015-04-09 LAB — TSH: TSH: 2.274 u[IU]/mL (ref 0.350–4.500)

## 2015-04-09 NOTE — Consult Note (Signed)
Date: 04/09/2015                  Patient Name:  Juan Cameron  MRN: [id number]  DOB: Feb 24, 1975  Age / Sex: 40 y.o., male         PCP: ASRES,ALEHEGN, MD                 Service Requesting Consult: Internal medicine and select Hospital                 Reason for Consult: Acute inferior            History of Present Illness: Patient is a 40 y.o. African American male with medical problems of  autism and sickle cell trait. Admitted to [facility] for acute respiratory failure and multifocal community acquired pneumonia. Originally admitted on February 17 , then readmitted on February 28 for similar problems. At baseline, patient is able to interact with family. Per H&P and notes, the patient has 500 word vocabulary and mind of a 70-year-old. He was originally started on a short-term trial of dialysis dialysis for acute renal failure. Prior to admission, dialysis catheter was removed. His BUN/creatinine has worsened. BUN today is 150 and creatinine is 4.67. Potassium normal at 4.3. Patient is incontinent. 4 voids are recorded in the last 24 hours Nephrology team has been requested for evaluation for dialysis. Other pertinent labs include a phosphorus level of 11.0 Prior notes indicate the patient's sister who is his power of attorney wants to pursue chronic dialysis.    Medications: Outpatient medications: Prescriptions prior to admission  Medication Sig Dispense Refill Last Dose  . acetaminophen (TYLENOL) 160 MG/5ML solution Place 20.3 mLs (650 mg total) into feeding tube every 4 (four) hours as needed for mild pain, headache or fever. 120 mL 0   . albuterol (PROVENTIL) (2.5 MG/3ML) 0.083% nebulizer solution Take 2.5 mg by nebulization every 6 (six) hours as needed for wheezing or shortness of breath.   03/11/2015 at Unknown time  . Amino Acids-Protein Hydrolys (FEEDING SUPPLEMENT, PRO-STAT SUGAR FREE 64,) LIQD Place 30 mLs into feeding tube 2 (two) times daily. 900 mL 0   .  chlorhexidine gluconate, SAGE KIT, (PERIDEX) 0.12 % solution 15 mLs by Mouth Rinse route 2 (two) times daily. 120 mL 0   . clonazePAM (KLONOPIN) 1 MG tablet Take 1 tablet (1 mg total) by mouth 2 (two) times daily. 30 tablet 0   . docusate (COLACE) 50 MG/5ML liquid Place 10 mLs (100 mg total) into feeding tube daily. 100 mL 0   . fentaNYL (SUBLIMAZE) 100 MCG/2ML injection Inject 0.5-1 mLs (25-50 mcg total) into the vein every 2 (two) hours as needed for severe pain. 2 mL 0   . heparin 5000 UNIT/ML injection Inject 1 mL (5,000 Units total) into the skin every 8 (eight) hours. 1 mL    . insulin aspart (NOVOLOG) 100 UNIT/ML injection Inject 2-6 Units into the skin every 4 (four) hours. 10 mL 11   . ipratropium-albuterol (DUONEB) 0.5-2.5 (3) MG/3ML SOLN Take 3 mLs by nebulization 3 (three) times daily. 360 mL 3   . meropenem 500 mg in sodium chloride 0.9 % 50 mL Inject 500 mg into the vein daily.     . Nutritional Supplements (FEEDING SUPPLEMENT, VITAL HIGH PROTEIN,) LIQD liquid Place 1,000 mLs into feeding tube daily.     . pantoprazole sodium (PROTONIX) 40 mg/20 mL PACK Place 20 mLs (40 mg total) into feeding tube daily at 12 noon.  30 each    . polyethylene glycol (MIRALAX / GLYCOLAX) packet Take 17 g by mouth daily as needed for moderate constipation. 14 each 0     Current medications: No current facility-administered medications for this encounter.      Allergies: Allergies  Allergen Reactions  . Peanuts [Peanut Oil] Anaphylaxis      Past Medical History: Past Medical History  Diagnosis Date  . Autism   . Sickle cell trait ([place])   . Pneumonia 02/2015  . Autism      Past Surgical History: Past Surgical History  Procedure Laterality Date  . No past surgeries       Family History: Family History  Problem Relation Age of Onset  . Heart attack Mother      Social History: Social History   Social History  . Marital Status: Single    Spouse Name: N/A  . Number of  Children: N/A  . Years of Education: N/A   Occupational History  . Not on file.   Social History Main Topics  . Smoking status: Never Smoker   . Smokeless tobacco: Never Used  . Alcohol Use: No  . Drug Use: No  . Sexual Activity: Yes   Other Topics Concern  . Not on file   Social History Narrative     Review of Systems: n/A Gen:  HEENT:  CV:  Resp:  GI: GU :  MS:  Derm:   Psych: Heme:  Neuro:  Endocrine  Vital Signs: There were no vitals taken for this visit.  No intake or output data in the 24 hours ending 04/09/15 1606  Weight trends: There were no vitals filed for this visit.  Physical Exam: General:  critically ill-appearing  HEENT Conjunctiva, NG tube in place  Neck:  tracheostomy in place  Lungs: Coarse breath sounds, ventilator dependent  Heart:: Regular, no rub  Abdomen: Soft, mildly distended, nontender  Extremities:  some dependent edema  Neurologic: Eyes open, did not follow any commands  Skin: No acute rashes  Access: Dialysis catheter to be placed  Foley: none       Lab results: Basic Metabolic Panel:  Recent Labs Lab 04/03/15 0700  04/06/15 0405 04/07/15 0445 04/09/15 0630  NA 135  < > 138 136 136  K 5.2*  < > 3.8 4.3 4.3  CL 95*  < > 99* 98* 97*  CO2 25  < > 28 23 22   GLUCOSE 125*  < > 119* 130* 105*  BUN 92*  < > 51* 93* 150*  CREATININE 2.73*  < > 2.09* 3.12* 4.67*  CALCIUM 8.3*  < > 8.8* 9.0 8.6*  PHOS 11.0*  --   --   --   --   < > = values in this interval not displayed.  Liver Function Tests:  Recent Labs Lab 04/09/15 0630  AST 80*  ALT 148*  ALKPHOS 77  BILITOT 0.7  PROT 4.9*  ALBUMIN 1.8*   No results for input(s): LIPASE, AMYLASE in the last 168 hours. No results for input(s): AMMONIA in the last 168 hours.  CBC:  Recent Labs Lab 04/06/15 0405 04/07/15 0445 04/09/15 0630  WBC 31.1* 32.5* 19.1*  NEUTROABS 26.8* 29.8*  --   HGB 8.1* 8.3* 7.6*  HCT 24.5* 24.9* 23.4*  MCV 84.8 81.6 80.4  PLT  169 192 167    Cardiac Enzymes: No results for input(s): CKTOTAL, TROPONINI in the last 168 hours.  BNP: Invalid input(s): POCBNP  CBG:  Recent Labs  Lab 04/07/15 0003 04/07/15 0437 04/07/15 0919 04/07/15 1208 04/07/15 1509  GLUCAP 132* 121* 120* 105* 97    Microbiology: Recent Results (from the past 720 hour(s))  Respiratory virus panel     Status: None   Collection Time: 03/11/15  8:22 AM  Result Value Ref Range Status   Source - RVPAN NASOPHARYNGEAL  Corrected   Respiratory Syncytial Virus A Negative Negative Final   Respiratory Syncytial Virus B Negative Negative Final   Influenza A Negative Negative Final   Influenza B Negative Negative Final   Parainfluenza 1 Negative Negative Final   Parainfluenza 2 Negative Negative Final   Parainfluenza 3 Negative Negative Final   Metapneumovirus Negative Negative Final   Rhinovirus Negative Negative Final   Adenovirus Negative Negative Final    Comment: (NOTE) Performed At: [facility] [address] [place], [place] [id number] Lindon Romp MD CL:[id number]   Culture, blood (routine x 2) Call MD if unable to obtain prior to antibiotics being given     Status: None   Collection Time: 03/11/15  9:00 AM  Result Value Ref Range Status   Specimen Description BLOOD LEFT ANTECUBITAL  Final   Special Requests   Final    BOTTLES DRAWN AEROBIC AND ANAEROBIC 10CC AER 5CC ANA   Culture NO GROWTH 5 DAYS  Final   Report Status 03/16/2015 FINAL  Final  Culture, blood (routine x 2) Call MD if unable to obtain prior to antibiotics being given     Status: None   Collection Time: 03/11/15  9:10 AM  Result Value Ref Range Status   Specimen Description BLOOD LEFT HAND  Final   Special Requests   Final    BOTTLES DRAWN AEROBIC AND ANAEROBIC 10CC AER 5CC ANA   Culture NO GROWTH 5 DAYS  Final   Report Status 03/16/2015 FINAL  Final  MRSA PCR Screening     Status: None   Collection Time: 03/11/15  3:31 PM  Result Value Ref  Range Status   MRSA by PCR NEGATIVE NEGATIVE Final    Comment:        The GeneXpert MRSA Assay (FDA approved for NASAL specimens only), is one component of a comprehensive MRSA colonization surveillance program. It is not intended to diagnose MRSA infection nor to guide or monitor treatment for MRSA infections.   Urine culture     Status: None   Collection Time: 03/11/15  7:00 PM  Result Value Ref Range Status   Specimen Description URINE, RANDOM  Final   Special Requests NONE  Final   Culture NO GROWTH 2 DAYS  Final   Report Status 03/13/2015 FINAL  Final  Culture, respiratory (NON-Expectorated)     Status: None   Collection Time: 03/12/15 12:29 PM  Result Value Ref Range Status   Specimen Description BRONCHIAL ALVEOLAR LAVAGE  Final   Special Requests LLL  Final   Gram Stain   Final    NO WBC SEEN NO SQUAMOUS EPITHELIAL CELLS SEEN NO ORGANISMS SEEN Performed at [facility]    Culture   Final    NO GROWTH 2 DAYS Performed at [facility]    Report Status 03/15/2015 FINAL  Final  Culture, blood (routine x 2)     Status: None   Collection Time: 03/15/15  2:45 PM  Result Value Ref Range Status   Specimen Description BLOOD RIGHT ANTECUBITAL  Final   Special Requests BOTTLES DRAWN AEROBIC AND ANAEROBIC 10CC  Final   Culture NO GROWTH  5 DAYS  Final   Report Status 03/20/2015 FINAL  Final  Culture, blood (routine x 2)     Status: None   Collection Time: 03/15/15  3:00 PM  Result Value Ref Range Status   Specimen Description BLOOD BLOOD RIGHT HAND  Final   Special Requests BOTTLES DRAWN AEROBIC AND ANAEROBIC 10CC  Final   Culture NO GROWTH 5 DAYS  Final   Report Status 03/20/2015 FINAL  Final  Culture, respiratory (NON-Expectorated)     Status: None   Collection Time: 03/15/15  3:10 PM  Result Value Ref Range Status   Specimen Description SPUTUM  Final   Special Requests NONE  Final   Gram Stain   Final    FEW WBC PRESENT,BOTH PMN AND MONONUCLEAR NO  SQUAMOUS EPITHELIAL CELLS SEEN RARE YEAST THIS SPECIMEN IS ACCEPTABLE FOR SPUTUM CULTURE Performed at [facility]    Culture   Final    MODERATE YEAST Performed at [facility]    Report Status 03/18/2015 FINAL  Final  Gram stain     Status: None   Collection Time: 03/16/15 11:28 AM  Result Value Ref Range Status   Specimen Description TRACHEAL SITE  Final   Special Requests NONE  Final   Gram Stain   Final    FEW WBC PRESENT,BOTH PMN AND MONONUCLEAR FEW YEAST    Report Status 03/16/2015 FINAL  Final  Culture, respiratory (NON-Expectorated)     Status: None   Collection Time: 03/16/15 11:28 AM  Result Value Ref Range Status   Specimen Description TRACHEAL ASPIRATE  Final   Special Requests Normal  Final   Gram Stain   Final    FEW WBC PRESENT,BOTH PMN AND MONONUCLEAR FEW YEAST Performed at [facility] Performed at [place] Performed at [facility]    Report Status 03/18/2015 FINAL  Final  Culture, respiratory (NON-Expectorated)     Status: None   Collection Time: 03/20/15  3:08 PM  Result Value Ref Range Status   Specimen Description BRONCHIAL ALVEOLAR LAVAGE  Final   Special Requests NONE  Final   Gram Stain   Final    MODERATE WBC PRESENT, PREDOMINANTLY PMN NO SQUAMOUS EPITHELIAL CELLS SEEN NO ORGANISMS SEEN Performed at [facility]    Culture   Final    FEW YEAST CONSISTENT WITH CANDIDA SPECIES Performed at [facility]    Report Status 03/23/2015 FINAL  Final  Culture, Urine     Status: None   Collection Time: 03/21/15  3:19 PM  Result Value Ref Range Status   Specimen Description URINE, RANDOM  Final   Special Requests NONE  Final   Culture NO GROWTH 1 DAY  Final   Report Status 03/22/2015 FINAL  Final  Culture, blood (routine x 2)     Status: None   Collection Time: 03/21/15  6:19 PM  Result Value Ref Range Status   Specimen Description BLOOD RIGHT  ANTECUBITAL  Final   Special Requests BOTTLES DRAWN AEROBIC AND ANAEROBIC 10CC  Final   Culture NO GROWTH 5 DAYS  Final   Report Status 03/26/2015 FINAL  Final  Culture, blood (routine x 2)     Status: None   Collection Time: 03/21/15  6:33 PM  Result Value Ref Range Status   Specimen Description BLOOD RIGHT HAND  Final   Special Requests BOTTLES DRAWN AEROBIC ONLY 10CC  Final   Culture  NO GROWTH 5 DAYS  Final   Report Status 03/26/2015 FINAL  Final  Culture, respiratory (NON-Expectorated)     Status: None   Collection Time: 04/01/15 10:46 AM  Result Value Ref Range Status   Specimen Description TRACHEAL ASPIRATE  Final   Special Requests Normal  Final   Gram Stain   Final    ABUNDANT WBC PRESENT,BOTH PMN AND MONONUCLEAR RARE SQUAMOUS EPITHELIAL CELLS PRESENT RARE YEAST Performed at [facility]    Culture   Final    FEW YEAST CONSISTENT WITH CANDIDA SPECIES Performed at [facility]    Report Status 04/03/2015 FINAL  Final     Coagulation Studies: No results for input(s): LABPROT, INR in the last 72 hours.  Urinalysis: No results for input(s): COLORURINE, LABSPEC, PHURINE, GLUCOSEU, HGBUR, BILIRUBINUR, KETONESUR, PROTEINUR, UROBILINOGEN, NITRITE, LEUKOCYTESUR in the last 72 hours.  Invalid input(s): APPERANCEUR      Imaging: Dg Chest Port 1 View  04/09/2015  CLINICAL DATA:  Respiratory failure. EXAM: PORTABLE CHEST 1 VIEW COMPARISON:  04/07/2015. FINDINGS: Tracheostomy tube and NG tube in stable position. Heart size normal. Bilateral basilar subsegmental atelectasis and or infiltrate. No pleural effusion or pneumothorax. IMPRESSION: 1. Tracheostomy tube and NG tube in stable position. 2. Mild bibasilar subsegmental atelectasis and/or infiltrates. Similar findings noted on prior exam. Electronically Signed   By: Martin   On: 04/09/2015 07:31   Dg Abd Portable 1v  04/09/2015  CLINICAL DATA:  Verify orogastric tube placement. EXAM: PORTABLE ABDOMEN  - 1 VIEW COMPARISON:  Supine portable abdominal film of March 12, 2015. FINDINGS: The orogastric tube tip lies in the pre-pyloric region. There is a moderate amount of gas within the stomach. The proximal port of the tube is well below the expected location of the GE junction. There is a moderate amount of gas within fairly normal caliber small bowel loops. There is colonic gas is well. No free extraluminal gas collections are observed. IMPRESSION: The orogastric tube tip lies in the pre-pyloric region. Positioning is adequate for gastric suction. Electronically Signed   By: David  Martinique M.D.   On: 04/09/2015 07:33      Assessment & Plan: Pt is a 40 y.o. yo male with a PMHX of autism and sickle cell trait, was admitted on 04/07/2015 with acute respiratory failure and acute renal failure.   1. 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 We had a discussion today. At this time, it is unknown whether patient will have renal recovery. For now, patient's family wants to pursue dialysis. Patient's sister states that if need be, she will accompany him to a chronic dialysis unit. Outpatient discharge placement will depend on whether he is able to come off of ventilator and requirement for tracheostomy.  2. Hyperphosphatemia - Discussed with the dietitian. Change tube feeds to Nepro  3. Acute respiratory failure - Tracheostomy, ventilator dependent at present  Risks, benefits, alternatives of dialysis were discussed with the patient's sister. She has consented for dialysis. We will place the orders for dialysis for tomorrow as well as Friday

## 2015-04-09 NOTE — Progress Notes (Signed)
Chief Complaint: Acute renal failure Need for dialysis access  Referring Physician(s): [place]  Supervising Physician: Daryll Brod  History of Present Illness: Juan Cameron is a 40 y.o. male  autistic man with a hospital admission for multilobar pneumonia and discharged on 4 L oxygen and returned within 24 hours on 2/28.   He then had a prolonged course complicated by ARDS, septic shock and AK I requiring dialysis  [facility] was [facility] after tracheostomy.   HD catheter was removed as a part of infection control.   His sister who is POA wishes to continue dialysis  On exam-poorly responsive, tracheostomy, on ventilator, soft stool, soft nontender abdomen, decreased breath sounds bilateral  Past Medical History  Diagnosis Date  . Autism   . Sickle cell trait ([place])   . Pneumonia 02/2015  . Autism     Past Surgical History  Procedure Laterality Date  . No past surgeries      Allergies: Peanuts  Medications: Prior to Admission medications   Medication Sig Start Date End Date Taking? Authorizing Provider  acetaminophen (TYLENOL) 160 MG/5ML solution Place 20.3 mLs (650 mg total) into feeding tube every 4 (four) hours as needed for mild pain, headache or fever. 04/07/15   Bincy S Varughese, NP  albuterol (PROVENTIL) (2.5 MG/3ML) 0.083% nebulizer solution Take 2.5 mg by nebulization every 6 (six) hours as needed for wheezing or shortness of breath.    Historical Provider, MD  Amino Acids-Protein Hydrolys (FEEDING SUPPLEMENT, PRO-STAT SUGAR FREE 64,) LIQD Place 30 mLs into feeding tube 2 (two) times daily. 04/07/15   Bincy S Varughese, NP  chlorhexidine gluconate, SAGE KIT, (PERIDEX) 0.12 % solution 15 mLs by Mouth Rinse route 2 (two) times daily. 04/07/15   Bincy S Varughese, NP  clonazePAM (KLONOPIN) 1 MG tablet Take 1 tablet (1 mg total) by mouth 2 (two) times daily. 04/07/15   Bincy S Varughese, NP  docusate (COLACE) 50 MG/5ML liquid Place 10 mLs (100 mg  total) into feeding tube daily. 04/07/15   Bincy S Varughese, NP  fentaNYL (SUBLIMAZE) 100 MCG/2ML injection Inject 0.5-1 mLs (25-50 mcg total) into the vein every 2 (two) hours as needed for severe pain. 04/07/15   Bincy S Varughese, NP  heparin 5000 UNIT/ML injection Inject 1 mL (5,000 Units total) into the skin every 8 (eight) hours. 04/07/15   Bincy S Varughese, NP  insulin aspart (NOVOLOG) 100 UNIT/ML injection Inject 2-6 Units into the skin every 4 (four) hours. 04/07/15   Bincy S Varughese, NP  ipratropium-albuterol (DUONEB) 0.5-2.5 (3) MG/3ML SOLN Take 3 mLs by nebulization 3 (three) times daily. 04/07/15   Bincy S Varughese, NP  meropenem 500 mg in sodium chloride 0.9 % 50 mL Inject 500 mg into the vein daily. 04/07/15   Bincy S Varughese, NP  Nutritional Supplements (FEEDING SUPPLEMENT, VITAL HIGH PROTEIN,) LIQD liquid Place 1,000 mLs into feeding tube daily. 04/07/15   Bincy S Varughese, NP  pantoprazole sodium (PROTONIX) 40 mg/20 mL PACK Place 20 mLs (40 mg total) into feeding tube daily at 12 noon. 04/07/15   Bincy S Varughese, NP  polyethylene glycol (MIRALAX / GLYCOLAX) packet Take 17 g by mouth daily as needed for moderate constipation. 04/07/15   Holley Raring, NP     Family History  Problem Relation Age of Onset  . Heart attack Mother     Social History   Social History  . Marital Status: Single    Spouse Name: N/A  .  Number of Children: N/A  . Years of Education: N/A   Social History Main Topics  . Smoking status: Never Smoker   . Smokeless tobacco: Never Used  . Alcohol Use: No  . Drug Use: No  . Sexual Activity: Yes   Other Topics Concern  . Not on file   Social History Narrative    Review of Systems  Unable to perform ROS: Other  Patient with autism, tracheostomy, and minimally responsive  Vital Signs: There were no vitals taken for this visit.  Physical Exam  Constitutional: He appears well-developed and well-nourished.  HENT:  Head: Atraumatic.    Cardiovascular: Normal rate and regular rhythm.   Pulmonary/Chest:  Tracheostomy/Ventilator Bilateral coarse rhochi  Abdominal: Soft. Bowel sounds are normal.  Neurological:  Minimally responive    Mallampati Score:  MD Evaluation Airway: Other (comments) Airway comments: Tracheostomy/Ventilator Heart: WNL Abdomen: WNL Chest/ Lungs: Other (comments) Chest/ lungs comments: Scattered rhonchi ASA  Classification: 4 Mallampati/Airway Score:  (Trach/vent)  Imaging: Dg Chest Port 1 View  04/09/2015  CLINICAL DATA:  Respiratory failure. EXAM: PORTABLE CHEST 1 VIEW COMPARISON:  04/07/2015. FINDINGS: Tracheostomy tube and NG tube in stable position. Heart size normal. Bilateral basilar subsegmental atelectasis and or infiltrate. No pleural effusion or pneumothorax. IMPRESSION: 1. Tracheostomy tube and NG tube in stable position. 2. Mild bibasilar subsegmental atelectasis and/or infiltrates. Similar findings noted on prior exam. Electronically Signed   By: [place]   On: 04/09/2015 07:31   Dg Chest Port 1 View  04/07/2015  CLINICAL DATA:  Acute respiratory failure with hypoxemia EXAM: PORTABLE CHEST 1 VIEW COMPARISON:  Portable chest x-ray of April 06, 2015 FINDINGS: The tracheostomy appliance hips lies at the level of the inferior margin of the clavicular heads. The lungs are reasonably well inflated. The interstitial markings remain increased in the left mid and lower lung and at the right lung base but have improved overall. The cardiac silhouette is top-normal in size. The pulmonary vascularity is not engorged. The nasogastric tube tip projects below the inferior margin of the image. The right internal jugular Cordis sheath has been removed. IMPRESSION: Mild interval improvement in bibasilar atelectasis. The support tubes are in stable position. Electronically Signed   By: David  Martinique M.D.   On: 04/07/2015 07:22   Dg Chest Port 1 View  04/06/2015  CLINICAL DATA:  Acute respiratory  failure, hypoxemia EXAM: PORTABLE CHEST 1 VIEW COMPARISON:  04/05/2015 FINDINGS: Cardiomediastinal silhouette is stable. Stable right IJ central line with tip in SVC. Tracheostomy tube is unchanged in position. No convincing pulmonary edema. No segmental infiltrate. Mild basilar atelectasis. NG tube is unchanged in position. IMPRESSION: Stable tracheostomy and NG tube position. No convincing pulmonary edema. No segmental infiltrate. Mild basilar atelectasis. Electronically Signed   By: Lahoma Crocker M.D.   On: 04/06/2015 09:30   Dg Chest Port 1 View  04/05/2015  CLINICAL DATA:  40 year old male with a history of acute respiratory failure EXAM: PORTABLE CHEST 1 VIEW COMPARISON:  04/04/2015 FINDINGS: Cardiomediastinal silhouette unchanged in size and contour. Unchanged position of right IJ central venous catheter. Unchanged tracheostomy tube. Unchanged gastric tube. Low lung volumes persist with bilateral mixed interstitial and airspace opacities. No large consolidation. No pneumothorax is visualized. IMPRESSION: Low lung volumes remain, with the mixed opacities potentially atelectasis, mild edema, and/ or consolidation. Unchanged support apparatus. Signed, Dulcy Fanny. Earleen Newport, DO [facility] Electronically Signed   By: Corrie Mckusick D.O.   On: 04/05/2015 08:14  Dg Chest Port 1 View  04/04/2015  CLINICAL DATA:  Acute respiratory failure EXAM: PORTABLE CHEST 1 VIEW COMPARISON:  04/02/2015 FINDINGS: Cardiac shadow is stable. Right jugular line, tracheostomy tube and nasogastric catheter are stable in appearance. There is improved aeration in the bases bilaterally with resolution of right-sided atelectatic changes. Mild residual left basilar atelectasis is noted. No bony abnormality is seen. IMPRESSION: Mild left basilar atelectasis. Electronically Signed   By: Inez Catalina M.D.   On: 04/04/2015 07:27   Dg Chest Port 1 View  04/02/2015  CLINICAL DATA:   Respiratory failure. EXAM: PORTABLE CHEST 1 VIEW COMPARISON:  04/01/2015. FINDINGS: Tracheostomy tube, NG tube, right IJ line stable position. Mediastinum hilar structures are unremarkable. Heart size normal. Low lung volumes with bibasilar atelectasis and/or infiltrates, progressed from prior exam. No pleural effusion or pneumothorax. IMPRESSION: 1. Lines and tubes in stable position. 2. Low lung volumes with bibasilar atelectasis and/or infiltrates, progressed from prior exam. Electronically Signed   By: [place]   On: 04/02/2015 07:20   Dg Chest Port 1 View  04/01/2015  CLINICAL DATA:  Respiratory failure. EXAM: PORTABLE CHEST 1 VIEW COMPARISON:  03/31/2015. FINDINGS: Tracheostomy tube, NG tube, right IJ line stable position. Heart size stable. Progressive left lower lobe infiltrate noted most consistent pneumonia. Mild right base subsegmental atelectasis and or infiltrate noted. No pleural effusion or pneumothorax. IMPRESSION: 1. Lines and tubes in stable position. 2. Progressive left lower lobe infiltrate suggesting pneumonia. Mild right base subsegmental atelectasis and or infiltrate also noted . Electronically Signed   By: [place]   On: 04/01/2015 07:39   Dg Chest Port 1 View  03/31/2015  CLINICAL DATA:  Respiratory failure.  Short of breath. EXAM: PORTABLE CHEST 1 VIEW COMPARISON:  03/30/2015 FINDINGS: Tracheostomy remains in good position. Right jugular central venous catheter tip in the SVC unchanged. NG tube enters the stomach. Progression of bibasilar airspace disease. This may represent pneumonia or edema. No significant effusion. IMPRESSION: Progression of bibasilar infiltrate. Electronically Signed   By: Franchot Gallo M.D.   On: 03/31/2015 07:20   Dg Chest Port 1 View  03/30/2015  CLINICAL DATA:  Respiratory failure EXAM: PORTABLE CHEST 1 VIEW COMPARISON:  Chest radiograph from one day prior. FINDINGS: Tracheostomy tube tip overlies the tracheal air column at the thoracic  inlet. Right internal jugular central venous catheter terminates in the middle third of the superior vena cava. Stable cardiomediastinal silhouette with normal heart size. No pneumothorax. No pleural effusion. Mild hazy opacities throughout both lungs, slightly improved. IMPRESSION: 1. Well-positioned support hardware as described. 2. Mild hazy opacities throughout both lungs, slightly improved, consistent with improving noncardiogenic pulmonary edema/ARDS. Electronically Signed   By: Ilona Sorrel M.D.   On: 03/30/2015 08:46   Dg Chest Port 1 View  03/29/2015  CLINICAL DATA:  ARDS EXAM: PORTABLE CHEST 1 VIEW COMPARISON:  Chest radiograph from one day prior. FINDINGS: Tracheostomy tube tip overlies the tracheal air column at the thoracic inlet. Enteric tube enters the stomach with the tip not seen on this image. Right internal jugular central venous catheter terminates in the middle third of the superior vena cava. Stable cardiomediastinal silhouette with top-normal heart size. No pneumothorax. No pleural effusion. Hazy opacities throughout both lungs are not appreciably changed. IMPRESSION: 1. Well-positioned support structures as described. No pneumothorax. 2. Stable hazy opacities throughout both lungs, consistent with noncardiogenic pulmonary edema/ARDS. Electronically Signed   By: Ilona Sorrel M.D.   On: 03/29/2015 10:06   Dg Chest  Port 1 View  03/28/2015  CLINICAL DATA:  Pneumonia. EXAM: PORTABLE CHEST 1 VIEW COMPARISON:  03/27/2015 FINDINGS: Tracheostomy tube overlies the airway. Right jugular central venous catheter terminates over the mid to lower SVC. Left jugular approach catheter terminates in the region of the lower jugular vein, unchanged. Enteric tube courses into the left upper abdomen with tip not imaged. Cardiomediastinal silhouette is unchanged allowing for leftward patient rotation on the current examination. Bilateral airspace opacities are greatest in the lung bases and have not  significantly changed. No sizable pleural effusion or pneumothorax is identified. IMPRESSION: 1. Unchanged support devices as above. 2. Unchanged airspace opacities which may reflect pulmonary edema or pneumonia. Electronically Signed   By: Logan Bores M.D.   On: 03/28/2015 08:06   Dg Chest Port 1 View  03/27/2015  CLINICAL DATA:  Central line placement EXAM: PORTABLE CHEST 1 VIEW COMPARISON:  03/27/2015 FINDINGS: Tracheostomy remains in good position. Left jugular central venous catheter unchanged in the left jugular vein above the innominate vein Interval placement of right jugular dual-lumen catheter with the tip in the lower SVC. No pneumothorax Progression of bilateral airspace disease which may represent pulmonary edema versus pneumonia. Bibasilar atelectasis. No significant effusion IMPRESSION: Right jugular dual-lumen catheter in the SVC without pneumothorax. Progression of bilateral airspace disease suggesting pulmonary edema. Electronically Signed   By: Franchot Gallo M.D.   On: 03/27/2015 14:58   Dg Chest Port 1 View  03/27/2015  CLINICAL DATA:  Adult respiratory distress syndrome. EXAM: PORTABLE CHEST 1 VIEW COMPARISON:  March 26, 2015. FINDINGS: Stable cardiomediastinal silhouette. Stable position of tracheostomy and nasogastric tubes. No pneumothorax is noted. Stable persistent density seen in right lung apex concerning for inflammation. Stable bibasilar opacities are noted concerning for atelectasis or possibly edema. Bony thorax is unremarkable. IMPRESSION: Stable support apparatus. Stable right apical density concerning for infiltrate. Stable bibasilar interstitial densities concerning for atelectasis or edema. Electronically Signed   By: Marijo Conception, M.D.   On: 03/27/2015 07:52   Dg Chest Port 1 View  03/26/2015  CLINICAL DATA:  ARDS EXAM: PORTABLE CHEST 1 VIEW COMPARISON:  March 25, 2015 FINDINGS: Tracheostomy catheter tip is 5.2 cm above the carina. Left jugular catheter tip is in  the left jugular vein near the junction with the left innominate vein, unchanged. Nasogastric tube tip and side port are below the diaphragm. No pneumothorax. Interstitial edema persists without appreciable change. Airspace consolidation in the right upper lobe persists. There appears to be a degree of partial clearing from the lung bases bilaterally. Localized consolidation medial portion of the right base persists without change. No new opacity is evident on either side. Heart is borderline enlarged with the pulmonary vascularity indicating a degree of pulmonary venous hypertension. No adenopathy evident. IMPRESSION: Partial clearing of airspace consolidation from the lung bases. Persistent airspace consolidation right upper lobe and medial right base. Underlying interstitial edema. No new opacity evident on either side. No change in cardiac silhouette. No pneumothorax. Electronically Signed   By: Lowella Grip III M.D.   On: 03/26/2015 07:41   Dg Chest Port 1 View  03/25/2015  CLINICAL DATA:  Chronic ventilator dependent respiratory failure. Followup pneumonia. EXAM: PORTABLE CHEST 1 VIEW COMPARISON:  03/24/2015 and earlier. FINDINGS: Tracheostomy tube tip in satisfactory position below the thoracic inlet. Nasogastric tube courses below the diaphragm into the stomach. Left jugular central venous catheter tip projects likely at the junction of the left subclavian vein and left innominate vein, unchanged. Since yesterday, marked  progression of airspace opacities throughout both lungs, most confluent in the right upper lobe and in the left lower lobe. Possible small bilateral pleural effusions. Cardiac silhouette mildly enlarged, unchanged. IMPRESSION: 1. Support apparatus as before. 2. Marked worsening of pneumonia throughout both lungs since yesterday, most confluent in the right upper lobe and left lower lobe. Electronically Signed   By: Evangeline Dakin M.D.   On: 03/25/2015 08:08   Dg Chest Port 1  View  03/24/2015  CLINICAL DATA:  40 year old male with sickle cell trait. Respiratory failure. Subsequent encounter. EXAM: PORTABLE CHEST 1 VIEW COMPARISON:  03/23/2015. FINDINGS: Tracheostomy tube tip midline. Left central line tip projects at the level of the left upper lung unchanged from prior exam. Exact position indeterminate by plain film exam. This may be within the left internal jugular vein/ subclavian vein junction. Correlation with blood return recommended. Cardiomegaly. Pulmonary vascular congestion/ mild pulmonary edema similar to prior exam. Patchy consolidation lung bases greater on left unchanged. This may represent atelectasis although infiltrate not excluded. Recommend followup until clearance. Mild sclerotic appearance of bone marrow may be related to patient's history of sickle cell disease. No gross pneumothorax. IMPRESSION: Left central line tip projects at the level of the left upper lung unchanged from prior exam. Exact position indeterminate by plain film exam. This may be within the left internal jugular vein/ subclavian vein junction. Correlation with blood return recommended. Cardiomegaly. Pulmonary vascular congestion/ mild pulmonary edema similar to prior exam. Patchy consolidation lung bases greater on left unchanged. This may represent atelectasis although infiltrate not excluded. Electronically Signed   By: Genia Del M.D.   On: 03/24/2015 07:44   Dg Chest Port 1 View  03/23/2015  CLINICAL DATA:  Respiratory failure EXAM: PORTABLE CHEST 1 VIEW COMPARISON:  03/22/2015 FINDINGS: Pulmonary vascular congestion with mild interstitial edema. Mild patchy bilateral lower lobe opacities, likely atelectasis, pneumonia not excluded. Possible small left pleural effusion. No pneumothorax. Tracheostomy in satisfactory position. The heart is normal in size. Left IJ venous catheter in the left brachiocephalic vein. Enteric tube terminates in the stomach. IMPRESSION: Pulmonary vascular  congestion with mild interstitial edema and possible small left pleural effusion, grossly unchanged. Mild patchy bilateral lower lobe opacities, likely atelectasis, pneumonia not excluded. Electronically Signed   By: Julian Hy M.D.   On: 03/23/2015 07:32   Dg Chest Port 1 View  03/22/2015  CLINICAL DATA:  Hypoxia.  Acute respiratory failure. EXAM: PORTABLE CHEST 1 VIEW COMPARISON:  03/21/2015 FINDINGS: 1610 hours. Tracheostomy tube again noted. The NG tube passes into the stomach although the distal tip position is not included on the film. Lordotic rotated film. Cardiopericardial silhouette is at upper limits of normal for size. Vascular congestion and basilar airspace disease persists. Telemetry leads overlie the chest. IMPRESSION: Low volume film with features suggesting pulmonary edema. No substantial interval change. Electronically Signed   By: Misty Stanley M.D.   On: 03/22/2015 16:56   Dg Chest Port 1 View  03/22/2015  CLINICAL DATA:  ETT evaluation EXAM: PORTABLE CHEST 1 VIEW COMPARISON:  March 21, 2015 FINDINGS: The tracheostomy tube is in good position. An NG tube terminates below today's film. The left IJ is stable. No pneumothorax. Mild pulmonary opacities are stable. IMPRESSION: Appropriate placement of support apparatus. Stable pulmonary opacities favored to represent edema. Electronically Signed   By: Dorise Bullion III M.D   On: 03/22/2015 08:11   Dg Chest Port 1 View  03/21/2015  CLINICAL DATA:  Acute respiratory failure, pneumonia, ARDS, left  ventricular diastolic dysfunction. EXAM: PORTABLE CHEST 1 VIEW COMPARISON:  Portable chest x-ray dated March 20, 2015 FINDINGS: The lungs are mildly hypoinflated. The interstitial markings remain coarse bilaterally. The right hemidiaphragm is better demonstrated today. There remains partial obscuration of the left hemidiaphragm. The cardiac silhouette is top-normal in size. The pulmonary vascularity is indistinct. The tracheostomy appliance  tip projects between the clavicular heads. The esophagogastric tube tip projects below the inferior margin of the image. The left internal jugular venous catheter tip projects at the junction of the left subclavian vein with the left internal jugular vein. IMPRESSION: Slight interval improvement in bilateral interstitial and alveolar airspace disease. CHF is favor though coexisting pneumonia is not excluded. Stable support tube positioning. Electronically Signed   By: David  Martinique M.D.   On: 03/21/2015 07:30   Dg Chest Port 1 View  03/20/2015  CLINICAL DATA:  Tracheostomy care. EXAM: PORTABLE CHEST 1 VIEW COMPARISON:  Chest x-ray from earlier same day and chest x-ray dated 03/19/2015. FINDINGS: Interval placement of a tracheostomy tube with tip well positioned approximately 3 cm above the carina. Left IJ central line stable in position with tip likely in the left brachiocephalic vein. Enteric tube passes below the diaphragm. Cardiomediastinal silhouette is stable in size and configuration. Patchy bilateral airspace opacities appear slightly worsened and is most suggestive of pulmonary edema. Probable small bilateral pleural effusions are stable. No new lung findings. No pneumothorax seen. IMPRESSION: 1. Interval tracheostomy placement with tip well positioned approximately 3 cm above the carina. No evidence of procedural complicating feature. 2. Patchy bilateral airspace opacities, perhaps slightly worsened compared to the previous exams, most suggestive of pulmonary edema. 3. Probable small bilateral pleural effusions. Electronically Signed   By: Franki Cabot M.D.   On: 03/20/2015 14:57   Dg Chest Port 1 View  03/20/2015  CLINICAL DATA:  Acute respiratory failure, intubated patient, Health Care associated pneumonia, ARDS, CHF. EXAM: PORTABLE CHEST 1 VIEW COMPARISON:  Portable chest x-ray of March 19, 2015 FINDINGS: The lungs remain mildly hypoinflated. Persistent coarse lung markings are present on the left in  the mid and lower lung and at the right lung base with obscuration of the hemidiaphragms. The cardiac silhouette remains enlarged. The pulmonary vascularity remains engorged and indistinct. There is no pneumothorax nor large pleural effusion. The endotracheal tube tip lies 2.8 cm above the carina. The esophagogastric tube tip projects below the inferior margin of the image. The left internal jugular venous catheter tip projects over the junction of the left internal jugular vein with the right subclavian vein. IMPRESSION: Fairly stable appearance of the chest with persistent bibasilar atelectasis or pneumonia. No pneumothorax observed. CHF with mild pulmonary interstitial edema is also unchanged. The support tubes are in reasonable position. Electronically Signed   By: David  Martinique M.D.   On: 03/20/2015 07:13   Dg Chest Port 1 View  03/19/2015  CLINICAL DATA:  Shortness of breath. EXAM: PORTABLE CHEST 1 VIEW COMPARISON:  03/18/2015. FINDINGS: Endotracheal tube, NG tube, left IJ line in stable position. Stable cardiomegaly. Low lung volumes with bibasilar atelectasis and/or infiltrates. Persistent low lung volumes with bibasilar atelectasis and infiltrates. Small left pleural effusion cannot be excluded. Lung markings in the upper most aspect of the pulmonary apices are not identified no definite pneumothorax noted. Continued surveillance suggested. IMPRESSION: 1. Lines and tubes in stable position. 2. Low lung volumes with persistent bibasilar atelectasis and infiltrates. Small left pleural effusion cannot be excluded. 3. Lung markings in the upper most aspect  the pulmonary apices are not identified, no definite pneumothorax noted. Continued surveillance suggested . Electronically Signed   By: Marcello Moores  Register   On: 03/19/2015 07:29   Dg Chest Port 1 View  03/18/2015  CLINICAL DATA:  Acute respiratory failure, healthcare associated pneumonia, CHF, ARDS, intubated patient. EXAM: PORTABLE CHEST 1 VIEW COMPARISON:   Portable chest x-ray of March 17, 2015 FINDINGS: The lung volumes remain low. The pulmonary interstitial markings have improved somewhat. There remains partial obscuration of the hemidiaphragms due to bibasilar atelectasis and a small left-sided pleural effusion. The cardiac silhouette is top-normal in size. The pulmonary vascularity is less engorged. The endotracheal tube tip lies approximately 2.2 cm above the carina. The esophagogastric tube tip projects below the inferior margin of the image. The left internal jugular venous catheter tip projects at the level of the junction of the right and left brachiocephalic veins as they form the SVC. IMPRESSION: Slight interval improvement in the pulmonary interstitium suggests decreasing interstitial edema. Persistent bibasilar atelectasis or pneumonia. The support tubes are in reasonable position. Electronically Signed   By: David  Martinique M.D.   On: 03/18/2015 07:22   Dg Chest Port 1 View  03/17/2015  CLINICAL DATA:  The evaluate ET tube placement EXAM: PORTABLE CHEST 1 VIEW COMPARISON:  03/16/2015 FINDINGS: ET tube tip is situated just above the carina. There is a left IJ catheter with tip in the projection of the SVC. The nasogastric tube tip is in the stomach. Decreased lung volumes. There is mild diffuse pulmonary edema. Airspace opacities within the lower lobes are again noted left greater than right. IMPRESSION: No change in aeration to the lungs compared with previous exam. Electronically Signed   By: Kerby Moors M.D.   On: 03/17/2015 13:08   Dg Chest Port 1 View  03/16/2015  CLINICAL DATA:  Re-evaluate pulmonary infiltrates EXAM: PORTABLE CHEST 1 VIEW COMPARISON:  03/15/2015 FINDINGS: Endotracheal tube tip about 1 cm above the carina. Tip of left internal jugular central line projects over the left distal brachiocephalic vein. Persistent cardiac silhouette enlargement. Moderate bibasilar infiltrates again identified similar to prior study. IMPRESSION:  Bibasilar infiltrates without significant change Electronically Signed   By: Skipper Cliche M.D.   On: 03/16/2015 08:42   Dg Chest Port 1 View  03/15/2015  CLINICAL DATA:  Acute respiratory failure with hypoxemia EXAM: PORTABLE CHEST 1 VIEW COMPARISON:  Portable exam 0515 hours compared to 03/13/2015 FINDINGS: Tip of endotracheal tube projects 1.8 cm above carina. Nasogastric tube extends into stomach. LEFT jugular central venous catheter tip projects over distal LEFT brachiocephalic vein near SVC confluence. Enlargement of cardiac silhouette. BILATERAL pulmonary infiltrates new since prior exam. Decreased lung volumes with bibasilar atelectasis. No definite pneumothorax is identified. Persistent soft tissue gas RIGHT cervical region. IMPRESSION: Decrease in lung volumes since previous exam with bibasilar atelectasis and scattered pulmonary infiltrates. Electronically Signed   By: Lavonia Dana M.D.   On: 03/15/2015 09:23   Portable Chest Xray  03/13/2015  CLINICAL DATA:  Acute respiratory failure with hypoxia, healthcare associated pneumonia, ARDS. EXAM: PORTABLE CHEST 1 VIEW COMPARISON:  Portable chest x-ray of March 12, 2015 FINDINGS: The lungs are well-expanded. Subtle interstitial density at both lung bases is less conspicuous today. There is no pleural effusion or pneumothorax. The cardiac silhouette is at the upper limits of normal for size but stable. The pulmonary vascularity is not engorged. The endotracheal tube tip lies 5.5 cm above the carina. The left internal jugular venous catheter tip projects over the  proximal SVC. The esophagogastric tube tip projects below the inferior margin of the image. IMPRESSION: Further slight interval improvement in the appearance of the bibasilar interstitial infiltrates. There is no significant pleural effusion nor pulmonary edema. The support tubes are in stable position. Electronically Signed   By: David  Martinique M.D.   On: 03/13/2015 07:19   Dg Chest Port 1  View  03/12/2015  CLINICAL DATA:  Acute respiratory failure EXAM: PORTABLE CHEST 1 VIEW COMPARISON:  Chest radiograph from earlier today. FINDINGS: Endotracheal tube tip is 2.4 cm above the carina. Left internal jugular central venous catheter terminates in the middle third of the superior vena cava. Stable cardiomediastinal silhouette with normal heart size. No pneumothorax. No pleural effusion. Patchy consolidation at the left greater than right lung bases, not appreciably changed. Overall improved lung volumes. No pulmonary edema. IMPRESSION: 1. Well-positioned support structures.  Improved lung volumes. 2. Persistent patchy consolidation at the left greater than right lung bases, suspicious for multifocal pneumonia versus aspiration. Electronically Signed   By: Ilona Sorrel M.D.   On: 03/12/2015 14:54   Dg Chest Port 1 View  03/12/2015  CLINICAL DATA:  Post intubation, respiratory failure. Central line placement EXAM: PORTABLE CHEST 1 VIEW COMPARISON:  03/11/2015 FINDINGS: Endotracheal tube is 3 cm above the carina. Left central line tip is in the SVC. No pneumothorax. There is cardiomegaly with vascular congestion and bilateral airspace opacities. Low lung volumes. No visible effusions. No acute bony abnormality. IMPRESSION: Endotracheal tube and left central line as above.  No pneumothorax. Continued bilateral airspace opacities, likely edema. Electronically Signed   By: Rolm Baptise M.D.   On: 03/12/2015 12:29   Dg Chest Portable 1 View  03/11/2015  CLINICAL DATA:  Acute onset of shortness of breath. Decreased O2 saturation. Initial encounter. EXAM: PORTABLE CHEST 1 VIEW COMPARISON:  Chest radiograph performed 03/10/2015 FINDINGS: The lungs are hypoexpanded. Bibasilar airspace opacification may reflect pneumonia or possibly pulmonary edema. A small left pleural effusion is suspected. No pneumothorax is seen. The cardiomediastinal silhouette is borderline normal in size. No acute osseous abnormalities are  identified. IMPRESSION: Lungs hypoexpanded. Bibasilar airspace opacities may reflect pneumonia or possibly pulmonary edema, similar in appearance to the prior study. Small left pleural effusion suspected. Electronically Signed   By: Garald Balding M.D.   On: 03/11/2015 05:54   Dg Abd Portable 1v  04/09/2015  CLINICAL DATA:  Verify orogastric tube placement. EXAM: PORTABLE ABDOMEN - 1 VIEW COMPARISON:  Supine portable abdominal film of March 12, 2015. FINDINGS: The orogastric tube tip lies in the pre-pyloric region. There is a moderate amount of gas within the stomach. The proximal port of the tube is well below the expected location of the GE junction. There is a moderate amount of gas within fairly normal caliber small bowel loops. There is colonic gas is well. No free extraluminal gas collections are observed. IMPRESSION: The orogastric tube tip lies in the pre-pyloric region. Positioning is adequate for gastric suction. Electronically Signed   By: David  Martinique M.D.   On: 04/09/2015 07:33   Dg Abd Portable 1v  03/12/2015  CLINICAL DATA:  NG tube placement EXAM: PORTABLE ABDOMEN - 1 VIEW COMPARISON:  03/12/2015 FINDINGS: NG tube with tip in the gastric antrum. Side port within the stomach. Gas-filled loops of colon. LEFT basilar atelectasis. IMPRESSION: Advancement of the NG tube with tip in the gastric antrum. Electronically Signed   By: Suzy Bouchard M.D.   On: 03/12/2015 16:33   Dg Abd Portable  1v  03/12/2015  CLINICAL DATA:  Orogastric tube placement EXAM: PORTABLE ABDOMEN - 1 VIEW COMPARISON:  None. FINDINGS: The orogastric tube tip is in the proximal stomach. The side port of the tube is not seen and may well be above the diaphragm. The overall bowel gas pattern is unremarkable without obstruction or free air evident. There is moderate stool in the colon. IMPRESSION: Orogastric tube tip is in the proximal stomach. The side port of the tube is not seen. Suspect side-port in distal esophagus. Advise  advancing the tube 5 to 7 cm to insure that the tube tip and side port are both well within the stomach. Overall bowel gas pattern unremarkable. Electronically Signed   By: Lowella Grip III M.D.   On: 03/12/2015 13:16    Labs:  CBC:  Recent Labs  04/05/15 0400 04/06/15 0405 04/07/15 0445 04/09/15 0630  WBC 35.0* 31.1* 32.5* 19.1*  HGB 7.8* 8.1* 8.3* 7.6*  HCT 23.9* 24.5* 24.9* 23.4*  PLT 191 169 192 167    COAGS:  Recent Labs  02/28/15 1908 03/20/15 0354  INR 1.01 1.37  APTT 34 33    BMP:  Recent Labs  04/05/15 0400 04/06/15 0405 04/07/15 0445 04/09/15 0630  NA 135 138 136 136  K 3.9 3.8 4.3 4.3  CL 96* 99* 98* 97*  CO2 27 28 23 22   GLUCOSE 124* 119* 130* 105*  BUN 60* 51* 93* 150*  CALCIUM 8.6* 8.8* 9.0 8.6*  CREATININE 2.04* 2.09* 3.12* 4.67*  GFRNONAA 40* 39* 24* 15*  GFRAA 46* 45* 28* 17*    LIVER FUNCTION TESTS:  Recent Labs  03/26/15 0715 03/28/15 0400 03/29/15 0510 03/30/15 1740 04/03/15 0700 04/09/15 0630  BILITOT 2.1* 0.7 0.8  --   --  0.7  AST 54* 45* 193*  --   --  80*  ALT 35 38 53  --   --  148*  ALKPHOS 110 104 89  --   --  77  PROT 6.4* 6.5 6.1*  --   --  4.9*  ALBUMIN 2.2* 2.3* 2.3* 1.7* 1.7* 1.8*    TUMOR MARKERS: No results for input(s): AFPTM, CEA, CA199, CHROMGRNA in the last 8760 hours.  Assessment and Plan:  Acute renal failure with need for dialysis access.  Will proceed with tunneled cath tomorrow  Will hold tube feeds and heparin  Select to obtain consent from sister.  Thank you for this interesting consult.  I greatly enjoyed meeting Hollister Wessler and look forward to participating in their care.  A copy of this report was sent to the requesting provider on this date.  Electronically Signed: Murrell Redden PA-C 04/09/2015, 1:31 PM   I spent a total of 20 Minutes  in face to face in clinical consultation, greater than 50% of which was counseling/coordinating care for HD cath placement

## 2015-04-10 DIAGNOSIS — S27309A Unspecified injury of lung, unspecified, initial encounter: Secondary | ICD-10-CM

## 2015-04-10 DIAGNOSIS — Z9911 Dependence on respirator [ventilator] status: Secondary | ICD-10-CM

## 2015-04-10 DIAGNOSIS — J80 Acute respiratory distress syndrome: Secondary | ICD-10-CM

## 2015-04-10 LAB — RENAL FUNCTION PANEL
Albumin: 1.8 g/dL — ABNORMAL LOW (ref 3.5–5.0)
Anion gap: 18 — ABNORMAL HIGH (ref 5–15)
BUN: 155 mg/dL — ABNORMAL HIGH (ref 6–20)
CHLORIDE: 97 mmol/L — AB (ref 101–111)
CO2: 21 mmol/L — AB (ref 22–32)
Calcium: 8.7 mg/dL — ABNORMAL LOW (ref 8.9–10.3)
Creatinine, Ser: 5.12 mg/dL — ABNORMAL HIGH (ref 0.61–1.24)
GFR, EST AFRICAN AMERICAN: 15 mL/min — AB (ref >60)
GFR, EST NON AFRICAN AMERICAN: 13 mL/min — AB (ref >60)
Glucose, Bld: 106 mg/dL — ABNORMAL HIGH (ref 65–99)
POTASSIUM: 4.3 mmol/L (ref 3.5–5.1)
Phosphorus: 10 mg/dL — ABNORMAL HIGH (ref 2.5–4.6)
Sodium: 136 mmol/L (ref 135–145)

## 2015-04-10 LAB — CBC
HEMATOCRIT: 22.7 % — AB (ref 39.0–52.0)
HEMOGLOBIN: 7.7 g/dL — AB (ref 13.0–17.0)
MCH: 26.9 pg (ref 26.0–34.0)
MCHC: 33.9 g/dL (ref 30.0–36.0)
MCV: 79.4 fL (ref 78.0–100.0)
Platelets: 180 10*3/uL (ref 150–400)
RBC: 2.86 MIL/uL — AB (ref 4.22–5.81)
RDW: 16.4 % — ABNORMAL HIGH (ref 11.5–15.5)
WBC: 15.1 10*3/uL — AB (ref 4.0–10.5)

## 2015-04-10 MED ORDER — CEFAZOLIN SODIUM-DEXTROSE 2-4 GM/100ML-% IV SOLN: 2.0000 g | Freq: Once | INTRAVENOUS | Status: DC

## 2015-04-10 NOTE — Progress Notes (Signed)
Name: Juan RipperRobert Cameron MRN: [id number] DOB: 13-Nov-1975    ADMISSION DATE:  04/07/2015 CONSULTATION DATE:  3/28  [facility]  CHIEF COMPLAINT:Vent magagement  BRIEF PATIENT DESCRIPTION: 40 yo aa m  SIGNIFICANT EVENTS   SIGNIFICANT EVENTS: 2/28 readmit to hospital  03/12/15 Intubated 3/1 Bronchoscopy  3/14- unable to ventilate or oxygenate> DNR 3/15- pos 6.8 liters 3/16- HD started  LINES/TUBES: Central line L IJ TLC>3/1 Trach 3/9>>> Rt ij hd 3/16>>> 3/26   CULTURES: 2/28 bc x 2 negative 2/28 uc negative 2/28 RVP negative Sputum culture 3/4, 3/5, 3/9 >>yeast Blood culture 3/4 >>  Blood 3/10 > neg Resp culture 3/9 > yeast Urine 3/10 > ng  Resp culture 3/21 > yeast  ANTIBIOTICS: 2/28 vanc>>3/10 2/28 cefepime>>3/10 3/14 ceftaz>>>plan stop 3/21 3/14 vanc>>>3/16 3/14 anidulo>>>plan stop 21  3/21 vanc >> 3/24 3/21 meropenem >> 7 days  SIGNIFICANT EVENTS: 2/28 readmit to hospital  03/12/15 Intubated 3/1 Bronchoscopy  3/14- unable to ventilate or oxygenate> DNR 3/15- pos 6.8 liters 3/16- HD started     HISTORY OF PRESENT ILLNESS:   40 yo autistic male who [facility] with resp distress secondary to pneumonia. He proved refractory to abx and conservative treatment and was moved to ICU 3/01 with worsening pulmonary mechanics and required intubation 3/01, tracheostomy 3/9, HD cath 3/16 and agressive antibiotics, HD support and renal interventions. Despite aggressive treatment his chances of any meaningful recovery are almost non existent. [facility] and PCCM asked to manage the ventilator.   SUBJECTIVE:  Sedated on vent VITAL SIGNS: 97.9 125/74 103 99% 27 PHYSICAL EXAMINATION: General: Obese AAM on vent Neuro:  No follows commands at this time, appears interactive with family HEENT:  Trach-> vent Cardiovascular:  HSR RRR Lungs:  Mild rhonchi Abdomen:  Soft  Musculoskeletal:  intact Skin:  Warm  and dry   Recent Labs Lab 04/07/15 0445 04/09/15 0630 04/10/15 0706  NA 136 136 136  K 4.3 4.3 4.3  CL 98* 97* 97*  CO2 23 22 21*  BUN 93* 150* 155*  CREATININE 3.12* 4.67* 5.12*  GLUCOSE 130* 105* 106*    Recent Labs Lab 04/07/15 0445 04/09/15 0630 04/10/15 0706  HGB 8.3* 7.6* 7.7*  HCT 24.9* 23.4* 22.7*  WBC 32.5* 19.1* 15.1*  PLT 192 167 180   Dg Chest Port 1 View  04/09/2015  CLINICAL DATA:  Respiratory failure. EXAM: PORTABLE CHEST 1 VIEW COMPARISON:  04/07/2015. FINDINGS: Tracheostomy tube and NG tube in stable position. Heart size normal. Bilateral basilar subsegmental atelectasis and or infiltrate. No pleural effusion or pneumothorax. IMPRESSION: 1. Tracheostomy tube and NG tube in stable position. 2. Mild bibasilar subsegmental atelectasis and/or infiltrates. Similar findings noted on prior exam. Electronically Signed   By: Maisie Fushomas  Register   On: 04/09/2015 07:31   Dg Abd Portable 1v  04/09/2015  CLINICAL DATA:  Verify orogastric tube placement. EXAM: PORTABLE ABDOMEN - 1 VIEW COMPARISON:  Supine portable abdominal film of March 12, 2015. FINDINGS: The orogastric tube tip lies in the pre-pyloric region. There is a moderate amount of gas within the stomach. The proximal port of the tube is well below the expected location of the GE junction. There is a moderate amount of gas within fairly normal caliber small bowel loops. There is colonic gas is well. No free extraluminal gas collections are observed. IMPRESSION: The orogastric tube tip lies in the pre-pyloric region. Positioning is adequate for gastric suction. Electronically Signed   By:  David  [place] M.D.   On: 04/09/2015 07:33    ASSESSMENT:     Acute respiratory failure with hypoxia (HCC)   CAP (community acquired pneumonia)   Autism   Acute lung injury   Left ventricular diastolic dysfunction, NYHA class 2   ARDS (adult respiratory distress syndrome) (HCC)   Acute renal insufficiency   Obesity   Pressure  ulcer   Tracheostomy dependent (HCC)   Ventilator dependence [facility])  Discussion:  40 yo autistic male who was admitted to [facility] hospital 03/11/15 with resp distress secondary to pneumonia. He proved refractory to abx and conservative treatment and was moved to ICU 3/01 with worsening pulmonary mechanics and required intubation 3/01, tracheostomy 3/9, HD cath 3/16 and agressive antibiotics, HD support and renal interventions. Despite aggressive treatment his chances of any meaningful recovery are almost non existent. He was transferred to [facility] 3/27 and PCCM asked to manage the ventilator.   PLAN:   PULMONARY A: ARDS from severe CAP vs HCAP; Improving but still with significant lung injury New HCAP 3/21 Suspected undiagnosed OSA Pulm edema > improving P:  Trach collar as able Full support at night Continue volume removal with HD VAP prevention See ID  CARDIOVASCULAR A:  Septic Shock -- resolved P:  Tele  RENAL A:  Acute Kidney Injury, ARF P:  Per renal  GASTROINTESTINAL A:  Constipation on high dose narcotics, improved P:  Per ssh  HEMATOLOGIC A:  Hx of sickle cell trait VTE  Anemia without bleeding P:  Continue SQ heparin  INFECTIOUS A:  Recurrent PNA with ARDS HCAP 3/21,  WBC improving 3/26 but fever  P:  Abx per ssh  ENDOCRINE A:  Hyperglycemia  P:  Sliding scale insulin  NEUROLOGIC A:  Mentally impaired with limited vocabulary of 500 words Autism  Acute encephalopathy from metabolic disarray Neuromuscular weakness, profound P:  Sedation per ssh  Brett Canales Minor ACNP Adolph Pollack PCCM Pager [phone] till 3 pm If no answer page [phone] 04/10/2015, 9:09 AM   Attending Note:  40 year old male with autism and mental delay who suffered a PNA and come CHF and was intubated and subsequently trached.  On exam, he is arousable and coarse BS.  I reviewed CXR myself, trach in place.  Discussed with SSH-MD and RT.  CAP:  -  Abx as ordered.  - F/U on cultures.  Acute respiratory failure:  - TC for 12 hours today.  - Vent at night.  - TC 24/7 starting in AM.  ARDS:  - At night maintain on vent with lung protective ventilation.  - CXR to PRN.  Trach status:  - Maintain current size and type of trach.  - No decannulation or downsize.  Patient seen and examined, agree with above note.  I dictated the care and orders written for this patient under my direction.  Alyson Reedy, MD [phone]

## 2015-04-11 ENCOUNTER — Other Ambulatory Visit (HOSPITAL_COMMUNITY): Payer: Self-pay

## 2015-04-11 LAB — BASIC METABOLIC PANEL
Anion gap: 19 — ABNORMAL HIGH (ref 5–15)
BUN: 158 mg/dL — AB (ref 6–20)
CHLORIDE: 99 mmol/L — AB (ref 101–111)
CO2: 20 mmol/L — AB (ref 22–32)
Calcium: 8.9 mg/dL (ref 8.9–10.3)
Creatinine, Ser: 5.44 mg/dL — ABNORMAL HIGH (ref 0.61–1.24)
GFR calc non Af Amer: 12 mL/min — ABNORMAL LOW (ref >60)
GFR, EST AFRICAN AMERICAN: 14 mL/min — AB (ref >60)
Glucose, Bld: 113 mg/dL — ABNORMAL HIGH (ref 65–99)
POTASSIUM: 4 mmol/L (ref 3.5–5.1)
SODIUM: 138 mmol/L (ref 135–145)

## 2015-04-11 LAB — PHOSPHORUS: PHOSPHORUS: 10.8 mg/dL — AB (ref 2.5–4.6)

## 2015-04-11 MED ORDER — HEPARIN SODIUM (PORCINE) 1000 UNIT/ML IJ SOLN
INTRAMUSCULAR | Status: AC
  Filled 2015-04-11: qty 1

## 2015-04-11 MED ORDER — LIDOCAINE-EPINEPHRINE (PF) 1 %-1:200000 IJ SOLN
INTRAMUSCULAR | Status: AC
  Filled 2015-04-11: qty 30

## 2015-04-11 MED ORDER — FENTANYL CITRATE (PF) 100 MCG/2ML IJ SOLN
INTRAMUSCULAR | Status: AC | PRN
  Administered 2015-04-11: 50 ug via INTRAVENOUS
  Administered 2015-04-11: 25 ug via INTRAVENOUS

## 2015-04-11 MED ORDER — GELATIN ABSORBABLE 12-7 MM EX MISC
CUTANEOUS | Status: AC
  Filled 2015-04-11: qty 1

## 2015-04-11 MED ORDER — FENTANYL CITRATE (PF) 100 MCG/2ML IJ SOLN
INTRAMUSCULAR | Status: AC
  Filled 2015-04-11: qty 2

## 2015-04-11 MED ORDER — SODIUM CHLORIDE 0.9 % IV SOLN
INTRAVENOUS | Status: AC | PRN
  Administered 2015-04-11: 10 mL/h via INTRAVENOUS

## 2015-04-11 MED ORDER — MIDAZOLAM HCL 2 MG/2ML IJ SOLN
INTRAMUSCULAR | Status: AC | PRN
  Administered 2015-04-11: 0.5 mg via INTRAVENOUS
  Administered 2015-04-11: 1 mg via INTRAVENOUS

## 2015-04-11 MED ORDER — CEFAZOLIN SODIUM-DEXTROSE 2-3 GM-% IV SOLR
INTRAVENOUS | Status: AC
  Administered 2015-04-11: 2000 mg
  Filled 2015-04-11: qty 50

## 2015-04-11 MED ORDER — MIDAZOLAM HCL 2 MG/2ML IJ SOLN
INTRAMUSCULAR | Status: AC
  Filled 2015-04-11: qty 2

## 2015-04-11 NOTE — Sedation Documentation (Addendum)
Patient is resting comfortably. 

## 2015-04-11 NOTE — Sedation Documentation (Signed)
Patient is resting comfortably. 

## 2015-04-11 NOTE — Sedation Documentation (Signed)
Patient denies pain and is resting comfortably.  

## 2015-04-11 NOTE — Procedures (Signed)
Successful RT IJ HD CATH INSERTION NO COMP STABLE TIP SVC/RA READY FOR USE

## 2015-04-11 NOTE — Progress Notes (Signed)
Subjective:  PermCath was placed this morning Patient seen during first dialysis treatment. Tolerating well. blood flow rate 250 Eyes are open, did not answer any questions. However, per dialysis nurse, patient responded to visitor and who he was familiar with   Objective:  Vital signs in last 24 hours:  Pulse Rate:  [99-106] 100 (03/31 0950) Resp:  [17-24] 18 (03/31 0950) BP: (143-146)/(87-94) 143/87 mmHg (03/31 0930) SpO2:  [96 %-100 %] 100 % (03/31 0950) FiO2 (%):  [28 %] 28 % (03/31 0925)  Weight change:  There were no vitals filed for this visit.  Intake/Output:   No intake or output data in the 24 hours ending 04/11/15 1556   Physical Exam: General: Laying in  the bed, no acute distress  HEENT anicteric  Neck Tracheostomy in place, trach collar at present  Pulm/lungs Coarse breath sounds bilaterally  CVS/Heart Regular, no rub or gallop  Abdomen:  Soft, nontender  Extremities: Trace peripheral edema  Neurologic: Eyes open, did not follow commands  Skin: No acute rashes  Access: Right IJ PermCath. March 31       Basic Metabolic Panel:   Recent Labs Lab 04/06/15 0405 04/07/15 0445 04/09/15 0630 04/10/15 0706 04/11/15 0617  NA 138 136 136 136 138  K 3.8 4.3 4.3 4.3 4.0  CL 99* 98* 97* 97* 99*  CO2 28 23 22  21* 20*  GLUCOSE 119* 130* 105* 106* 113*  BUN 51* 93* 150* 155* 158*  CREATININE 2.09* 3.12* 4.67* 5.12* 5.44*  CALCIUM 8.8* 9.0 8.6* 8.7* 8.9  PHOS  --   --   --  10.0* 10.8*     CBC:  Recent Labs Lab 04/05/15 0400 04/06/15 0405 04/07/15 0445 04/09/15 0630 04/10/15 0706  WBC 35.0* 31.1* 32.5* 19.1* 15.1*  NEUTROABS 31.0* 26.8* 29.8*  --   --   HGB 7.8* 8.1* 8.3* 7.6* 7.7*  HCT 23.9* 24.5* 24.9* 23.4* 22.7*  MCV 82.1 84.8 81.6 80.4 79.4  PLT 191 169 192 167 180      Microbiology:  Recent Results (from the past 720 hour(s))  Culture, blood (routine x 2)     Status: None   Collection Time: 03/15/15  2:45 PM  Result Value Ref Range  Status   Specimen Description BLOOD RIGHT ANTECUBITAL  Final   Special Requests BOTTLES DRAWN AEROBIC AND ANAEROBIC 10CC  Final   Culture NO GROWTH 5 DAYS  Final   Report Status 03/20/2015 FINAL  Final  Culture, blood (routine x 2)     Status: None   Collection Time: 03/15/15  3:00 PM  Result Value Ref Range Status   Specimen Description BLOOD BLOOD RIGHT HAND  Final   Special Requests BOTTLES DRAWN AEROBIC AND ANAEROBIC 10CC  Final   Culture NO GROWTH 5 DAYS  Final   Report Status 03/20/2015 FINAL  Final  Culture, respiratory (NON-Expectorated)     Status: None   Collection Time: 03/15/15  3:10 PM  Result Value Ref Range Status   Specimen Description SPUTUM  Final   Special Requests NONE  Final   Gram Stain   Final    FEW WBC PRESENT,BOTH PMN AND MONONUCLEAR NO SQUAMOUS EPITHELIAL CELLS SEEN RARE YEAST THIS SPECIMEN IS ACCEPTABLE FOR SPUTUM CULTURE Performed at [facility]    Culture   Final    MODERATE YEAST Performed at [facility]    Report Status 03/18/2015 FINAL  Final  Gram stain     Status: None   Collection Time: 03/16/15  11:28 AM  Result Value Ref Range Status   Specimen Description TRACHEAL SITE  Final   Special Requests NONE  Final   Gram Stain   Final    FEW WBC PRESENT,BOTH PMN AND MONONUCLEAR FEW YEAST    Report Status 03/16/2015 FINAL  Final  Culture, respiratory (NON-Expectorated)     Status: None   Collection Time: 03/16/15 11:28 AM  Result Value Ref Range Status   Specimen Description TRACHEAL ASPIRATE  Final   Special Requests Normal  Final   Gram Stain   Final    FEW WBC PRESENT,BOTH PMN AND MONONUCLEAR FEW YEAST Performed at [facility] Performed at [facility]    Culture   Final    MODERATE YEAST Performed at [facility]    Report Status 03/18/2015 FINAL  Final  Culture, respiratory (NON-Expectorated)     Status: None   Collection Time: 03/20/15  3:08 PM  Result Value Ref Range Status    Specimen Description BRONCHIAL ALVEOLAR LAVAGE  Final   Special Requests NONE  Final   Gram Stain   Final    MODERATE WBC PRESENT, PREDOMINANTLY PMN NO SQUAMOUS EPITHELIAL CELLS SEEN NO ORGANISMS SEEN Performed at [facility]    Culture   Final    FEW YEAST CONSISTENT WITH CANDIDA SPECIES Performed at [facility]    Report Status 03/23/2015 FINAL  Final  Culture, Urine     Status: None   Collection Time: 03/21/15  3:19 PM  Result Value Ref Range Status   Specimen Description URINE, RANDOM  Final   Special Requests NONE  Final   Culture NO GROWTH 1 DAY  Final   Report Status 03/22/2015 FINAL  Final  Culture, blood (routine x 2)     Status: None   Collection Time: 03/21/15  6:19 PM  Result Value Ref Range Status   Specimen Description BLOOD RIGHT ANTECUBITAL  Final   Special Requests BOTTLES DRAWN AEROBIC AND ANAEROBIC 10CC  Final   Culture NO GROWTH 5 DAYS  Final   Report Status 03/26/2015 FINAL  Final  Culture, blood (routine x 2)     Status: None   Collection Time: 03/21/15  6:33 PM  Result Value Ref Range Status   Specimen Description BLOOD RIGHT HAND  Final   Special Requests BOTTLES DRAWN AEROBIC ONLY 10CC  Final   Culture NO GROWTH 5 DAYS  Final   Report Status 03/26/2015 FINAL  Final  Culture, respiratory (NON-Expectorated)     Status: None   Collection Time: 04/01/15 10:46 AM  Result Value Ref Range Status   Specimen Description TRACHEAL ASPIRATE  Final   Special Requests Normal  Final   Gram Stain   Final    ABUNDANT WBC PRESENT,BOTH PMN AND MONONUCLEAR RARE SQUAMOUS EPITHELIAL CELLS PRESENT RARE YEAST Performed at [facility]    Culture   Final    FEW YEAST CONSISTENT WITH CANDIDA SPECIES Performed at [facility]    Report Status 04/03/2015 FINAL  Final    Coagulation Studies: No results for input(s): LABPROT, INR in the last 72 hours.  Urinalysis: No results for input(s): COLORURINE, LABSPEC, PHURINE, GLUCOSEU,  HGBUR, BILIRUBINUR, KETONESUR, PROTEINUR, UROBILINOGEN, NITRITE, LEUKOCYTESUR in the last 72 hours.  Invalid input(s): APPERANCEUR    Imaging: Ir Fluoro Guide Cv Line Right  04/11/2015  INDICATION: End-stage renal disease, sickle cell trait, no current access EXAM: ULTRASOUND GUIDANCE FOR VASCULAR ACCESS RIGHT INTERNAL JUGULAR PERMANENT HEMODIALYSIS CATHETER Date:  3/31/20173/31/2017  9:34 am Radiologist:  M. Ruel Favors, MD Guidance:  Ultrasound and fluoroscopic FLUOROSCOPY TIME:  Fluoroscopy Time:  36 seconds (4 mGy). MEDICATIONS: 1% lidocaine locally, 2 g Ancef administered within 1 hour of the procedure. ANESTHESIA/SEDATION: Versed 1.5 mg IV; Fentanyl 75 mcg IV; Moderate Sedation Time:  15 minutes The patient was continuously monitored during the procedure by the interventional radiology nurse under my direct supervision. CONTRAST:  None. COMPLICATIONS: None immediate PROCEDURE: Informed consent was obtained from the patient following explanation of the procedure, risks, benefits and alternatives. The patient understands, agrees and consents for the procedure. All questions were addressed. A time out was performed. Maximal barrier sterile technique utilized including caps, mask, sterile gowns, sterile gloves, large sterile drape, hand hygiene, and 2% chlorhexidine scrub. Under sterile conditions and local anesthesia, right internal jugular micropuncture venous access was performed with ultrasound. Images were obtained for documentation. A guide wire was inserted followed by a transitional dilator. Next, a 0.035 guidewire was advanced into the IVC with a 5-French catheter. Measurements were obtained from the right venotomy site to the proximal right atrium. In the right infraclavicular chest, a subcutaneous tunnel was created under sterile conditions and local anesthesia. 1% lidocaine with epinephrine was utilized for this. The 19 cm tip to cuff palindrome catheter was tunneled subcutaneously to the  venotomy site and inserted into the SVC/RA junction through a valved peel-away sheath. Position was confirmed with fluoroscopy. Images were obtained for documentation. Blood was aspirated from the catheter followed by saline and heparin flushes. The appropriate volume and strength of heparin was instilled in each lumen. Caps were applied. The catheter was secured at the tunnel site with Gelfoam and a pursestring suture. The venotomy site was closed with subcuticular Vicryl suture. Dermabond was applied to the small right neck incision. A dry sterile dressing was applied. The catheter is ready for use. No immediate complications. IMPRESSION: Ultrasound and fluoroscopically guided right internal jugular tunneled hemodialysis catheter (19 cm tip to cuff palindrome catheter). Electronically Signed   By: Judie Petit.  Shick M.D.   On: 04/11/2015 09:53   Ir US Guide Vasc Access Right  04/11/2015  INDICATION: End-stage renal disease, sickle cell trait, no current access EXAM: ULTRASOUND GUIDANCE FOR VASCULAR ACCESS RIGHT INTERNAL JUGULAR PERMANENT HEMODIALYSIS CATHETER Date:  3/31/20173/31/2017 9:34 am Radiologist:  M. Ruel Favors, MD Guidance:  Ultrasound and fluoroscopic FLUOROSCOPY TIME:  Fluoroscopy Time:  36 seconds (4 mGy). MEDICATIONS: 1% lidocaine locally, 2 g Ancef administered within 1 hour of the procedure. ANESTHESIA/SEDATION: Versed 1.5 mg IV; Fentanyl 75 mcg IV; Moderate Sedation Time:  15 minutes The patient was continuously monitored during the procedure by the interventional radiology nurse under my direct supervision. CONTRAST:  None. COMPLICATIONS: None immediate PROCEDURE: Informed consent was obtained from the patient following explanation of the procedure, risks, benefits and alternatives. The patient understands, agrees and consents for the procedure. All questions were addressed. A time out was performed. Maximal barrier sterile technique utilized including caps, mask, sterile gowns, sterile gloves, large  sterile drape, hand hygiene, and 2% chlorhexidine scrub. Under sterile conditions and local anesthesia, right internal jugular micropuncture venous access was performed with ultrasound. Images were obtained for documentation. A guide wire was inserted followed by a transitional dilator. Next, a 0.035 guidewire was advanced into the IVC with a 5-French catheter. Measurements were obtained from the right venotomy site to the proximal right atrium. In the right infraclavicular chest, a subcutaneous tunnel was created under sterile conditions and local anesthesia. 1% lidocaine with epinephrine  was utilized for this. The 19 cm tip to cuff palindrome catheter was tunneled subcutaneously to the venotomy site and inserted into the SVC/RA junction through a valved peel-away sheath. Position was confirmed with fluoroscopy. Images were obtained for documentation. Blood was aspirated from the catheter followed by saline and heparin flushes. The appropriate volume and strength of heparin was instilled in each lumen. Caps were applied. The catheter was secured at the tunnel site with Gelfoam and a pursestring suture. The venotomy site was closed with subcuticular Vicryl suture. Dermabond was applied to the small right neck incision. A dry sterile dressing was applied. The catheter is ready for use. No immediate complications. IMPRESSION: Ultrasound and fluoroscopically guided right internal jugular tunneled hemodialysis catheter (19 cm tip to cuff palindrome catheter). Electronically Signed   By: Judie Petit.  Shick M.D.   On: 04/11/2015 09:53     Medications:     .  ceFAZolin (ANCEF) IV  2 g Intravenous Once  . fentaNYL      . gelatin adsorbable      . heparin      . lidocaine-EPINEPHrine      . midazolam         Assessment/ Plan:  40 y.o. male with a PMHX of autism and sickle cell trait, was admitted on 04/07/2015 with acute respiratory failure and acute renal failure.   1. Acute renal failure - Likely secondary to ATN  from vancomycin toxicity and sepsis - Dialysis started. Initially short term when patient was in [place], but may need it long term if no renal recovery - HD today, tomorrow then  Continue MWF schedule  2. Hyperphosphatemia - Discussed with the dietitian. Change tube feeds to Nepro. - Expected to improve some with HD  3. Acute respiratory failure - Tracheostomy, Trach collar at present    LOS:  Mahala Rommel 3/31/20173:56 PM

## 2015-04-12 LAB — BASIC METABOLIC PANEL
ANION GAP: 15 (ref 5–15)
BUN: 108 mg/dL — AB (ref 6–20)
CHLORIDE: 99 mmol/L — AB (ref 101–111)
CO2: 24 mmol/L (ref 22–32)
Calcium: 8.8 mg/dL — ABNORMAL LOW (ref 8.9–10.3)
Creatinine, Ser: 4.11 mg/dL — ABNORMAL HIGH (ref 0.61–1.24)
GFR calc Af Amer: 20 mL/min — ABNORMAL LOW (ref >60)
GFR, EST NON AFRICAN AMERICAN: 17 mL/min — AB (ref >60)
GLUCOSE: 127 mg/dL — AB (ref 65–99)
POTASSIUM: 3.5 mmol/L (ref 3.5–5.1)
Sodium: 138 mmol/L (ref 135–145)

## 2015-04-12 LAB — PHOSPHORUS: Phosphorus: 8 mg/dL — ABNORMAL HIGH (ref 2.5–4.6)

## 2015-04-14 LAB — BLOOD GAS, ARTERIAL
Acid-Base Excess: 3.6 mmol/L — ABNORMAL HIGH (ref 0.0–2.0)
BICARBONATE: 26 meq/L — AB (ref 20.0–24.0)
FIO2: 0.28
O2 Saturation: 99.8 %
PCO2 ART: 29 mmHg — AB (ref 35.0–45.0)
PH ART: 7.561 — AB (ref 7.350–7.450)
PO2 ART: 179 mmHg — AB (ref 80.0–100.0)
Patient temperature: 98.6
TCO2: 26.9 mmol/L (ref 0–100)

## 2015-04-14 NOTE — Progress Notes (Signed)
Name: Juan RipperRobert Cameron MRN: [id number] DOB: Mar 15, 1975    ADMISSION DATE:  04/07/2015 CONSULTATION DATE:  3/28  [facility]  CHIEF COMPLAINT:Vent magagement  BRIEF PATIENT DESCRIPTION: 40 yo aa m  SIGNIFICANT EVENTS   SIGNIFICANT EVENTS: 2/28 readmit to hospital  03/12/15 Intubated 3/1 Bronchoscopy  3/14- unable to ventilate or oxygenate> DNR 3/15- pos 6.8 liters 3/16- HD started  LINES/TUBES: Central line L IJ TLC>3/1 Trach 3/9>>> Rt ij hd 3/16>>> 3/26   CULTURES: 2/28 bc x 2 negative 2/28 uc negative 2/28 RVP negative Sputum culture 3/4, 3/5, 3/9 >>yeast Blood culture 3/4 >>  Blood 3/10 > neg Resp culture 3/9 > yeast Urine 3/10 > ng  Resp culture 3/21 > yeast  ANTIBIOTICS: 2/28 vanc>>3/10 2/28 cefepime>>3/10 3/14 ceftaz>>>plan stop 3/21 3/14 vanc>>>3/16 3/14 anidulo>>>plan stop 21  3/21 vanc >> 3/24 3/21 meropenem >> 7 days  SIGNIFICANT EVENTS: 2/28 readmit to hospital  03/12/15 Intubated 3/1 Bronchoscopy  3/14- unable to ventilate or oxygenate> DNR 3/15- pos 6.8 liters 3/16- HD started     HISTORY OF PRESENT ILLNESS:   40 yo autistic male who [facility] with resp distress secondary to pneumonia. He proved refractory to abx and conservative treatment and was moved to ICU 3/01 with worsening pulmonary mechanics and required intubation 3/01, tracheostomy 3/9, HD cath 3/16 and agressive antibiotics, HD support and renal interventions. Despite aggressive treatment his chances of any meaningful recovery are almost non existent. [facility] and PCCM asked to manage the ventilator.   SUBJECTIVE:  Sedate but arousable on TC  VITAL SIGNS: 98.9 135/83 115 99% 22 PHYSICAL EXAMINATION: General: Obese AAM on TC Neuro:  No follows commands at this time, appears interactive with family HEENT:  Trach-> vent Cardiovascular:  HSR RRR Lungs:  Mild rhonchi Abdomen:  Soft  Musculoskeletal:   intact Skin:  Warm and dry   Recent Labs Lab 04/10/15 0706 04/11/15 0617 04/12/15 0642  NA 136 138 138  K 4.3 4.0 3.5  CL 97* 99* 99*  CO2 21* 20* 24  BUN 155* 158* 108*  CREATININE 5.12* 5.44* 4.11*  GLUCOSE 106* 113* 127*    Recent Labs Lab 04/09/15 0630 04/10/15 0706  HGB 7.6* 7.7*  HCT 23.4* 22.7*  WBC 19.1* 15.1*  PLT 167 180   No results found.  ASSESSMENT:     Acute respiratory failure with hypoxia (HCC)   CAP (community acquired pneumonia)   Autism   Acute lung injury   Left ventricular diastolic dysfunction, NYHA class 2   ARDS (adult respiratory distress syndrome) (HCC)   Acute renal insufficiency   Obesity   Pressure ulcer   Tracheostomy dependent (HCC)   Ventilator dependence [facility](HCC)  Discussion:  40 yo autistic male who [facility] with resp distress secondary to pneumonia. He proved refractory to abx and conservative treatment and was moved to ICU 3/01 with worsening pulmonary mechanics and required intubation 3/01, tracheostomy 3/9, HD cath 3/16 and agressive antibiotics, HD support and renal interventions. Despite aggressive treatment his chances of any meaningful recovery are almost non existent. [facility] and PCCM asked to manage the ventilator.   PLAN:   PULMONARY A: ARDS from severe CAP vs HCAP; Improving but still with significant lung injury New HCAP 3/21 Suspected undiagnosed OSA Pulm edema > improving P:  Trach collar as tolerated, 24 hours so far, at 48 remove vent from room then at 72 can begin  downsizing and working towards decannulation. Continue volume removal with HD VAP prevention See ID  CARDIOVASCULAR A:  Septic Shock -- resolved P:  Tele  RENAL A:  Acute Kidney Injury, ARF P:  Per renal  GASTROINTESTINAL A:  Constipation on high dose narcotics, improved P:  Per ssh  HEMATOLOGIC A:  Hx of sickle cell trait VTE  Anemia without bleeding P:   Continue SQ heparin  INFECTIOUS A:  Recurrent PNA with ARDS HCAP 3/21,  WBC improving 3/26 but fever  P:  Abx per ssh  ENDOCRINE A:  Hyperglycemia  P:  Sliding scale insulin  NEUROLOGIC A:  Mentally impaired with limited vocabulary of 500 words Autism  Acute encephalopathy from metabolic disarray Neuromuscular weakness, profound P:  Minimize  Alyson Reedy, M.D. [facility] Pulmonary/Critical Care Medicine. Pager: [phone]. After hours pager: [phone].

## 2015-04-14 NOTE — Progress Notes (Signed)
Subjective:  Patient seen and evaluated during hemodialysis treatment. Patient noted to be slightly tachycardic at the moment. Tracheostomy in place.   Objective:  Vital signs in last 24 hours:   temperature 99.5 pulse 106 respirations 23 blood pressure 153/83   Physical Exam: General: Laying in  the bed, no acute distress  HEENT anicteric  Neck Tracheostomy in place, trach collar at present  Pulm/lungs Coarse breath sounds bilaterally  CVS/Heart Regular, no rub or gallop  Abdomen:  Soft, nontender  Extremities: Trace peripheral edema  Neurologic: Eyes open, did not follow commands  Skin: No acute rashes  Access: Right IJ PermCath. March 31       Basic Metabolic Panel:   Recent Labs Lab 04/09/15 0630 04/10/15 0706 04/11/15 0617 04/12/15 0642  NA 136 136 138 138  K 4.3 4.3 4.0 3.5  CL 97* 97* 99* 99*  CO2 22 21* 20* 24  GLUCOSE 105* 106* 113* 127*  BUN 150* 155* 158* 108*  CREATININE 4.67* 5.12* 5.44* 4.11*  CALCIUM 8.6* 8.7* 8.9 8.8*  PHOS  --  10.0* 10.8* 8.0*     CBC:  Recent Labs Lab 04/09/15 0630 04/10/15 0706  WBC 19.1* 15.1*  HGB 7.6* 7.7*  HCT 23.4* 22.7*  MCV 80.4 79.4  PLT 167 180      Microbiology:  Recent Results (from the past 720 hour(s))  Gram stain     Status: None   Collection Time: 03/16/15 11:28 AM  Result Value Ref Range Status   Specimen Description TRACHEAL SITE  Final   Special Requests NONE  Final   Gram Stain   Final    FEW WBC PRESENT,BOTH PMN AND MONONUCLEAR FEW YEAST    Report Status 03/16/2015 FINAL  Final  Culture, respiratory (NON-Expectorated)     Status: None   Collection Time: 03/16/15 11:28 AM  Result Value Ref Range Status   Specimen Description TRACHEAL ASPIRATE  Final   Special Requests Normal  Final   Gram Stain   Final    FEW WBC PRESENT,BOTH PMN AND MONONUCLEAR FEW YEAST Performed at [facility] Performed at [facility]    Culture   Final    MODERATE YEAST Performed at  [facility]    Report Status 03/18/2015 FINAL  Final  Culture, respiratory (NON-Expectorated)     Status: None   Collection Time: 03/20/15  3:08 PM  Result Value Ref Range Status   Specimen Description BRONCHIAL ALVEOLAR LAVAGE  Final   Special Requests NONE  Final   Gram Stain   Final    MODERATE WBC PRESENT, PREDOMINANTLY PMN NO SQUAMOUS EPITHELIAL CELLS SEEN NO ORGANISMS SEEN Performed at [facility]    Culture   Final    FEW YEAST CONSISTENT WITH CANDIDA SPECIES Performed at [facility]    Report Status 03/23/2015 FINAL  Final  Culture, Urine     Status: None   Collection Time: 03/21/15  3:19 PM  Result Value Ref Range Status   Specimen Description URINE, RANDOM  Final   Special Requests NONE  Final   Culture NO GROWTH 1 DAY  Final   Report Status 03/22/2015 FINAL  Final  Culture, blood (routine x 2)     Status: None   Collection Time: 03/21/15  6:19 PM  Result Value Ref Range Status   Specimen Description BLOOD RIGHT ANTECUBITAL  Final   Special Requests BOTTLES DRAWN AEROBIC AND ANAEROBIC 10CC  Final   Culture NO GROWTH 5 DAYS  Final   Report Status 03/26/2015 FINAL  Final  Culture, blood (routine x 2)     Status: None   Collection Time: 03/21/15  6:33 PM  Result Value Ref Range Status   Specimen Description BLOOD RIGHT HAND  Final   Special Requests BOTTLES DRAWN AEROBIC ONLY 10CC  Final   Culture NO GROWTH 5 DAYS  Final   Report Status 03/26/2015 FINAL  Final  Culture, respiratory (NON-Expectorated)     Status: None   Collection Time: 04/01/15 10:46 AM  Result Value Ref Range Status   Specimen Description TRACHEAL ASPIRATE  Final   Special Requests Normal  Final   Gram Stain   Final    ABUNDANT WBC PRESENT,BOTH PMN AND MONONUCLEAR RARE SQUAMOUS EPITHELIAL CELLS PRESENT RARE YEAST Performed at [facility]    Culture   Final    FEW YEAST CONSISTENT WITH CANDIDA SPECIES Performed at [facility]    Report Status  04/03/2015 FINAL  Final    Coagulation Studies: No results for input(s): LABPROT, INR in the last 72 hours.  Urinalysis: No results for input(s): COLORURINE, LABSPEC, PHURINE, GLUCOSEU, HGBUR, BILIRUBINUR, KETONESUR, PROTEINUR, UROBILINOGEN, NITRITE, LEUKOCYTESUR in the last 72 hours.  Invalid input(s): APPERANCEUR    Imaging: No results found.   Medications:     .  ceFAZolin (ANCEF) IV  2 g Intravenous Once     Assessment/ Plan:  40 y.o. male with a PMHX of autism and sickle cell trait, was admitted on 04/07/2015 with acute respiratory failure and acute renal failure.   1. Acute renal failure - Likely secondary to ATN from vancomycin toxicity and sepsis - Dialysis started. Initially short term when patient was in [place], but may need it long term if no renal recovery - patient seen and evaluated during hemodialysis. Appears to be tolerating well. Will limit ultrafiltration if blood pressure drops or heart rate increases.  2. Hyperphosphatemia - Discussed with the dietitian. Change tube feeds to Nepro. - phosphorus down to 8.0. Continue to monitor.  3. Acute respiratory failure - doing well with the tracheostomy and speaking valve. Continue to monitor O2 saturation.    LOS:  Aleem Elza 4/3/20174:26 PM

## 2015-04-15 ENCOUNTER — Other Ambulatory Visit (HOSPITAL_COMMUNITY): Payer: Self-pay

## 2015-04-15 LAB — CBC
HEMATOCRIT: 23.3 % — AB (ref 39.0–52.0)
Hemoglobin: 8 g/dL — ABNORMAL LOW (ref 13.0–17.0)
MCH: 28.4 pg (ref 26.0–34.0)
MCHC: 34.3 g/dL (ref 30.0–36.0)
MCV: 82.6 fL (ref 78.0–100.0)
PLATELETS: 101 10*3/uL — AB (ref 150–400)
RBC: 2.82 MIL/uL — ABNORMAL LOW (ref 4.22–5.81)
RDW: 16.4 % — ABNORMAL HIGH (ref 11.5–15.5)
WBC: 13.8 10*3/uL — ABNORMAL HIGH (ref 4.0–10.5)

## 2015-04-15 LAB — BASIC METABOLIC PANEL
ANION GAP: 13 (ref 5–15)
BUN: 64 mg/dL — AB (ref 6–20)
CHLORIDE: 100 mmol/L — AB (ref 101–111)
CO2: 28 mmol/L (ref 22–32)
Calcium: 8.9 mg/dL (ref 8.9–10.3)
Creatinine, Ser: 2.82 mg/dL — ABNORMAL HIGH (ref 0.61–1.24)
GFR calc Af Amer: 31 mL/min — ABNORMAL LOW (ref >60)
GFR, EST NON AFRICAN AMERICAN: 27 mL/min — AB (ref >60)
Glucose, Bld: 114 mg/dL — ABNORMAL HIGH (ref 65–99)
POTASSIUM: 2.8 mmol/L — AB (ref 3.5–5.1)
SODIUM: 141 mmol/L (ref 135–145)

## 2015-04-16 LAB — RENAL FUNCTION PANEL
ANION GAP: 15 (ref 5–15)
Albumin: 2.2 g/dL — ABNORMAL LOW (ref 3.5–5.0)
BUN: 73 mg/dL — ABNORMAL HIGH (ref 6–20)
CALCIUM: 9.1 mg/dL (ref 8.9–10.3)
CHLORIDE: 99 mmol/L — AB (ref 101–111)
CO2: 28 mmol/L (ref 22–32)
Creatinine, Ser: 2.94 mg/dL — ABNORMAL HIGH (ref 0.61–1.24)
GFR calc non Af Amer: 26 mL/min — ABNORMAL LOW (ref >60)
GFR, EST AFRICAN AMERICAN: 30 mL/min — AB (ref >60)
GLUCOSE: 107 mg/dL — AB (ref 65–99)
POTASSIUM: 2.9 mmol/L — AB (ref 3.5–5.1)
Phosphorus: 4.8 mg/dL — ABNORMAL HIGH (ref 2.5–4.6)
SODIUM: 142 mmol/L (ref 135–145)

## 2015-04-16 LAB — CBC
HEMATOCRIT: 22.9 % — AB (ref 39.0–52.0)
HEMOGLOBIN: 7.8 g/dL — AB (ref 13.0–17.0)
MCH: 28 pg (ref 26.0–34.0)
MCHC: 34.1 g/dL (ref 30.0–36.0)
MCV: 82.1 fL (ref 78.0–100.0)
Platelets: 140 10*3/uL — ABNORMAL LOW (ref 150–400)
RBC: 2.79 MIL/uL — AB (ref 4.22–5.81)
RDW: 16.3 % — ABNORMAL HIGH (ref 11.5–15.5)
WBC: 12.4 10*3/uL — ABNORMAL HIGH (ref 4.0–10.5)

## 2015-04-16 LAB — POTASSIUM: Potassium: 3.5 mmol/L (ref 3.5–5.1)

## 2015-04-16 NOTE — Progress Notes (Signed)
Name: Juan RipperRobert Cameron MRN: [id number] DOB: 08-18-1975    ADMISSION DATE:  04/07/2015 CONSULTATION DATE:  3/28  [facility]  CHIEF COMPLAINT:Vent magagement  BRIEF PATIENT DESCRIPTION: 40 yo aa m  SIGNIFICANT EVENTS   SIGNIFICANT EVENTS: 2/28 readmit to hospital  03/12/15 Intubated 3/1 Bronchoscopy  3/14- unable to ventilate or oxygenate> DNR 3/15- pos 6.8 liters 3/16- HD started  LINES/TUBES: Central line L IJ TLC>3/1 Trach 3/9>>> Rt ij hd 3/16>>> 3/26     3/21 vanc >> 3/24 3/21 meropenem >> 7 days  SIGNIFICANT EVENTS: 2/28 readmit to hospital  03/12/15 Intubated 3/1 Bronchoscopy  3/14- unable to ventilate or oxygenate> DNR 3/15- pos 6.8 liters 3/16- HD started 4/5 on t collar 24/7     HISTORY OF PRESENT ILLNESS:   40 yo autistic male who [facility]11[facility]15 with resp distress secondary to pneumonia. He proved refractory to abx and conservative treatment and was moved to ICU 3/01 with worsening pulmonary mechanics and required intubation 3/01, tracheostomy 3/9, HD cath 3/16 and agressive antibiotics, HD support and renal interventions. Despite aggressive treatment his chances of any meaningful recovery are almost non existent. [facility] and PCCM asked to manage the ventilator.   SUBJECTIVE:  Sedate but arousable on TC  VITAL SIGNS: 98.9 135/83 112 98% 34 PHYSICAL EXAMINATION: General: Obese AAM on TC Neuro:  No follows commands at this time HEENT:  Trach-> t collar Cardiovascular:  HSR RRR Lungs:  Mild rhonchi Abdomen:  Soft  Musculoskeletal:  intact Skin:  Warm and dry   Recent Labs Lab 04/12/15 0642 04/15/15 0820 04/16/15 0534  NA 138 141 142  K 3.5 2.8* 2.9*  CL 99* 100* 99*  CO2 24 28 28   BUN 108* 64* 73*  CREATININE 4.11* 2.82* 2.94*  GLUCOSE 127* 114* 107*    Recent Labs Lab 04/10/15 0706 04/15/15 0820 04/16/15 0534  HGB 7.7* 8.0* 7.8*  HCT 22.7* 23.3* 22.9*  WBC 15.1* 13.8*  12.4*  PLT 180 101* 140*   Dg Chest Port 1 View  04/15/2015  CLINICAL DATA:  Respiratory failure. EXAM: PORTABLE CHEST 1 VIEW COMPARISON:  04/09/2015. FINDINGS: Interim placement dual-lumen right IJ catheter, its tip is projected over the right atrium. Tracheostomy tube and NG tube in stable position. Its mild cardiomegaly. No pulmonary venous congestion. Low lung volumes with mild bibasilar atelectasis and/or infiltrates, unchanged. No pleural effusion or pneumothorax . IMPRESSION: 1. Interim placement of right IJ dual-lumen catheter. Tip is projected over the right atrium. Tracheostomy tube and NG tube in stable position. 2. Low lung volumes with mild basilar atelectasis and/or infiltrates, no change from prior exam. Electronically Signed   By: Maisie Fushomas  Register   On: 04/15/2015 07:58    ASSESSMENT:     Acute respiratory failure with hypoxia (HCC)   CAP (community acquired pneumonia)   Autism   Acute lung injury   Left ventricular diastolic dysfunction, NYHA class 2   ARDS (adult respiratory distress syndrome) (HCC)   Acute renal insufficiency   Obesity   Pressure ulcer   Tracheostomy dependent (HCC)   Ventilator dependence [facility](HCC)  Discussion:  40 yo autistic male who was [facility]15 with resp distress secondary to pneumonia. He proved refractory to abx and conservative treatment and was moved to ICU 3/01 with worsening pulmonary mechanics and required intubation 3/01, tracheostomy 3/9, HD cath 3/16 and agressive antibiotics, HD support and renal interventions. Despite aggressive treatment his chances of any meaningful  recovery are almost non existent. He was transferred to [facility] 3/27 and PCCM asked to manage the ventilator.   PLAN:   PULMONARY A: ARDS from severe CAP vs HCAP; Improving but still with significant lung injury New HCAP 3/21 Suspected undiagnosed OSA Pulm edema > improving P:  Trach collar as tolerated, 24 /7 work  towards decannulation. Continue  volume removal with HD VAP prevention See ID  CARDIOVASCULAR A:  Septic Shock -- resolved P:  Tele  RENAL A:  Acute Kidney Injury, ARF P:  Per renal  GASTROINTESTINAL A:  Constipation on high dose narcotics, improved P:  Per ssh  HEMATOLOGIC A:  Hx of sickle cell trait VTE  Anemia without bleeding P:  Continue SQ heparin  INFECTIOUS A:  Recurrent PNA with ARDS HCAP 3/21,  WBC improving 3/26 but fever  P:  Abx per ssh  ENDOCRINE A:  Hyperglycemia  P:  Sliding scale insulin  NEUROLOGIC A:  Mentally impaired with limited vocabulary of 500 words Autism  Acute encephalopathy from metabolic disarray Neuromuscular weakness, profound P:  Minimize  PCCM will see once a week on Mondays  Discussed with SSH-MD and PCCM-NP.  Do not decannulate, may proceed with capping but would like to see the patient capped until next week prior to decannulation, he was a very difficulty airway and not a simple tracheostomy.  Alyson Reedy, M.D. [facility] Pulmonary/Critical Care Medicine. Pager: [phone]. After hours pager: [phone].  04/16/2015, 4:17 PM

## 2015-04-16 NOTE — Progress Notes (Signed)
Name: Juan Cameron MRN: [id number] DOB: 08-18-1975    ADMISSION DATE:  04/07/2015 CONSULTATION DATE:  3/28  [facility]  CHIEF COMPLAINT:Vent magagement  BRIEF PATIENT DESCRIPTION: 40 yo aa m  SIGNIFICANT EVENTS   SIGNIFICANT EVENTS: 2/28 readmit to hospital  03/12/15 Intubated 3/1 Bronchoscopy  3/14- unable to ventilate or oxygenate> DNR 3/15- pos 6.8 liters 3/16- HD started  LINES/TUBES: Central line L IJ TLC>3/1 Trach 3/9>>> Rt ij hd 3/16>>> 3/26     3/21 vanc >> 3/24 3/21 meropenem >> 7 days  SIGNIFICANT EVENTS: 2/28 readmit to hospital  03/12/15 Intubated 3/1 Bronchoscopy  3/14- unable to ventilate or oxygenate> DNR 3/15- pos 6.8 liters 3/16- HD started 4/5 on t collar 24/7     HISTORY OF PRESENT ILLNESS:   40 yo autistic male who was [facility] 03/11/15 with resp distress secondary to pneumonia. He proved refractory to abx and conservative treatment and was moved to ICU 3/01 with worsening pulmonary mechanics and required intubation 3/01, tracheostomy 3/9, HD cath 3/16 and agressive antibiotics, HD support and renal interventions. Despite aggressive treatment his chances of any meaningful recovery are almost non existent. He was [facility] and PCCM asked to manage the ventilator.   SUBJECTIVE:  Sedate but arousable on TC  VITAL SIGNS: 98.9 135/83 112 98% 34 PHYSICAL EXAMINATION: General: Obese AAM on TC Neuro:  No follows commands at this time HEENT:  Trach-> t collar Cardiovascular:  HSR RRR Lungs:  Mild rhonchi Abdomen:  Soft  Musculoskeletal:  intact Skin:  Warm and dry   Recent Labs Lab 04/12/15 0642 04/15/15 0820 04/16/15 0534  NA 138 141 142  K 3.5 2.8* 2.9*  CL 99* 100* 99*  CO2 24 28 28   BUN 108* 64* 73*  CREATININE 4.11* 2.82* 2.94*  GLUCOSE 127* 114* 107*    Recent Labs Lab 04/10/15 0706 04/15/15 0820 04/16/15 0534  HGB 7.7* 8.0* 7.8*  HCT 22.7* 23.3* 22.9*  WBC 15.1* 13.8*  12.4*  PLT 180 101* 140*   Dg Chest Port 1 View  04/15/2015  CLINICAL DATA:  Respiratory failure. EXAM: PORTABLE CHEST 1 VIEW COMPARISON:  04/09/2015. FINDINGS: Interim placement dual-lumen right IJ catheter, its tip is projected over the right atrium. Tracheostomy tube and NG tube in stable position. Its mild cardiomegaly. No pulmonary venous congestion. Low lung volumes with mild bibasilar atelectasis and/or infiltrates, unchanged. No pleural effusion or pneumothorax . IMPRESSION: 1. Interim placement of right IJ dual-lumen catheter. Tip is projected over the right atrium. Tracheostomy tube and NG tube in stable position. 2. Low lung volumes with mild basilar atelectasis and/or infiltrates, no change from prior exam. Electronically Signed   By: Maisie Fushomas  Register   On: 04/15/2015 07:58    ASSESSMENT:     Acute respiratory failure with hypoxia (HCC)   CAP (community acquired pneumonia)   Autism   Acute lung injury   Left ventricular diastolic dysfunction, NYHA class 2   ARDS (adult respiratory distress syndrome) (HCC)   Acute renal insufficiency   Obesity   Pressure ulcer   Tracheostomy dependent (HCC)   Ventilator dependence [facility](HCC)  Discussion:  40 yo autistic male who was [facility] to [facility] 03/11/15 with resp distress secondary to pneumonia. He proved refractory to abx and conservative treatment and was moved to ICU 3/01 with worsening pulmonary mechanics and required intubation 3/01, tracheostomy 3/9, HD cath 3/16 and agressive antibiotics, HD support and renal interventions. Despite aggressive treatment his chances of any meaningful  recovery are almost non existent. [facility] and PCCM asked to manage the ventilator.   PLAN:   PULMONARY A: ARDS from severe CAP vs HCAP; Improving but still with significant lung injury New HCAP 3/21 Suspected undiagnosed OSA Pulm edema > improving P:  Trach collar as tolerated, 24 /7 work  towards decannulation. Continue  volume removal with HD VAP prevention See ID  CARDIOVASCULAR A:  Septic Shock -- resolved P:  Tele  RENAL A:  Acute Kidney Injury, ARF P:  Per renal  GASTROINTESTINAL A:  Constipation on high dose narcotics, improved P:  Per ssh  HEMATOLOGIC A:  Hx of sickle cell trait VTE  Anemia without bleeding P:  Continue SQ heparin  INFECTIOUS A:  Recurrent PNA with ARDS HCAP 3/21,  WBC improving 3/26 but fever  P:  Abx per ssh  ENDOCRINE A:  Hyperglycemia  P:  Sliding scale insulin  NEUROLOGIC A:  Mentally impaired with limited vocabulary of 500 words Autism  Acute encephalopathy from metabolic disarray Neuromuscular weakness, profound P:  Minimize  PCCM will see once a week on Mondays  [facility] N[facility] Rodrigues ACNP [facility] PCCM Pager [phone] till 3 pm If no answer page [phone] 04/16/2015, 10:35 AM

## 2015-04-16 NOTE — Progress Notes (Signed)
Subjective:  Patient completed hemodialysis today. Ultrafiltration achieved was 1.5 kg.   Objective:  Vital signs in last 24 hours:  Denture 98.9 pulse 106 respirations 20 blood pressure 120/70   Physical Exam: General: Laying in  the bed, no acute distress  HEENT anicteric  Neck Tracheostomy in place, trach collar at present  Pulm/lungs Coarse breath sounds bilaterally  CVS/Heart Regular, no rub or gallop  Abdomen:  Soft, nontender  Extremities: Trace peripheral edema  Neurologic: Awake, not following commands consistently  Skin: No acute rashes  Access: Right IJ PermCath. March 31       Basic Metabolic Panel:   Recent Labs Lab 04/10/15 0706 04/11/15 0617 04/12/15 0642 04/15/15 0820 04/16/15 0534  NA 136 138 138 141 142  K 4.3 4.0 3.5 2.8* 2.9*  CL 97* 99* 99* 100* 99*  CO2 21* 20* 24 28 28   GLUCOSE 106* 113* 127* 114* 107*  BUN 155* 158* 108* 64* 73*  CREATININE 5.12* 5.44* 4.11* 2.82* 2.94*  CALCIUM 8.7* 8.9 8.8* 8.9 9.1  PHOS 10.0* 10.8* 8.0*  --  4.8*     CBC:  Recent Labs Lab 04/10/15 0706 04/15/15 0820 04/16/15 0534  WBC 15.1* 13.8* 12.4*  HGB 7.7* 8.0* 7.8*  HCT 22.7* 23.3* 22.9*  MCV 79.4 82.6 82.1  PLT 180 101* 140*      Microbiology:  Recent Results (from the past 720 hour(s))  Culture, respiratory (NON-Expectorated)     Status: None   Collection Time: 03/20/15  3:08 PM  Result Value Ref Range Status   Specimen Description BRONCHIAL ALVEOLAR LAVAGE  Final   Special Requests NONE  Final   Gram Stain   Final    MODERATE WBC PRESENT, PREDOMINANTLY PMN NO SQUAMOUS EPITHELIAL CELLS SEEN NO ORGANISMS SEEN Performed at [facility]    Culture   Final    FEW YEAST CONSISTENT WITH CANDIDA SPECIES Performed at [facility]    Report Status 03/23/2015 FINAL  Final  Culture, Urine     Status: None   Collection Time: 03/21/15  3:19 PM  Result Value Ref Range Status   Specimen Description URINE, RANDOM  Final    Special Requests NONE  Final   Culture NO GROWTH 1 DAY  Final   Report Status 03/22/2015 FINAL  Final  Culture, blood (routine x 2)     Status: None   Collection Time: 03/21/15  6:19 PM  Result Value Ref Range Status   Specimen Description BLOOD RIGHT ANTECUBITAL  Final   Special Requests BOTTLES DRAWN AEROBIC AND ANAEROBIC 10CC  Final   Culture NO GROWTH 5 DAYS  Final   Report Status 03/26/2015 FINAL  Final  Culture, blood (routine x 2)     Status: None   Collection Time: 03/21/15  6:33 PM  Result Value Ref Range Status   Specimen Description BLOOD RIGHT HAND  Final   Special Requests BOTTLES DRAWN AEROBIC ONLY 10CC  Final   Culture NO GROWTH 5 DAYS  Final   Report Status 03/26/2015 FINAL  Final  Culture, respiratory (NON-Expectorated)     Status: None   Collection Time: 04/01/15 10:46 AM  Result Value Ref Range Status   Specimen Description TRACHEAL ASPIRATE  Final   Special Requests Normal  Final   Gram Stain   Final    ABUNDANT WBC PRESENT,BOTH PMN AND MONONUCLEAR RARE SQUAMOUS EPITHELIAL CELLS PRESENT RARE YEAST Performed at [facility]    Culture   Final    FEW YEAST  CONSISTENT WITH CANDIDA SPECIES Performed at [facility]    Report Status 04/03/2015 FINAL  Final  Culture, blood (routine x 2)     Status: None (Preliminary result)   Collection Time: 04/14/15  3:05 PM  Result Value Ref Range Status   Specimen Description BLOOD HEMODIALYSIS CATHETER  Final   Special Requests BOTTLES DRAWN AEROBIC AND ANAEROBIC 10CC  Final   Culture NO GROWTH < 24 HOURS  Final   Report Status PENDING  Incomplete  Culture, blood (routine x 2)     Status: None (Preliminary result)   Collection Time: 04/14/15  3:10 PM  Result Value Ref Range Status   Specimen Description BLOOD HEMODIALYSIS CATHETER  Final   Special Requests BOTTLES DRAWN AEROBIC AND ANAEROBIC 10CC  Final   Culture NO GROWTH < 24 HOURS  Final   Report Status PENDING  Incomplete    Coagulation  Studies: No results for input(s): LABPROT, INR in the last 72 hours.  Urinalysis: No results for input(s): COLORURINE, LABSPEC, PHURINE, GLUCOSEU, HGBUR, BILIRUBINUR, KETONESUR, PROTEINUR, UROBILINOGEN, NITRITE, LEUKOCYTESUR in the last 72 hours.  Invalid input(s): APPERANCEUR    Imaging: Dg Chest Port 1 View  04/15/2015  CLINICAL DATA:  Respiratory failure. EXAM: PORTABLE CHEST 1 VIEW COMPARISON:  04/09/2015. FINDINGS: Interim placement dual-lumen right IJ catheter, its tip is projected over the right atrium. Tracheostomy tube and NG tube in stable position. Its mild cardiomegaly. No pulmonary venous congestion. Low lung volumes with mild bibasilar atelectasis and/or infiltrates, unchanged. No pleural effusion or pneumothorax . IMPRESSION: 1. Interim placement of right IJ dual-lumen catheter. Tip is projected over the right atrium. Tracheostomy tube and NG tube in stable position. 2. Low lung volumes with mild basilar atelectasis and/or infiltrates, no change from prior exam. Electronically Signed   By: Maisie Fus  Register   On: 04/15/2015 07:58     Medications:     .  ceFAZolin (ANCEF) IV  2 g Intravenous Once     Assessment/ Plan:  40 y.o. male with a PMHX of autism and sickle cell trait, was admitted on 04/07/2015 with acute respiratory failure and acute renal failure.   1. Acute renal failure - Likely secondary to ATN from vancomycin toxicity and sepsis - Dialysis started. Initially short term when patient was in [place], but may need it long term if no renal recovery - patient seen after dialysis today. He tolerated well. Ultrafiltration achieved was 1.5 kg. Next dialysis scheduled for Friday.  2. Hyperphosphatemia/SHPTH - Discussed with the dietitian. Change tube feeds to Nepro. - phosphorus now down to 4.8 an acceptable.  3. Acute respiratory failure - patient has speaking valve in place and is communicating minimally at times. Continue to monitor status.  4.  Hypokalemia.  Serum potassium low at 2.9. Will give potassium chloride 30 mEq IV x1.    LOS:  Joas Motton 4/5/201711:14 AM

## 2015-04-18 LAB — URINALYSIS, ROUTINE W REFLEX MICROSCOPIC
BILIRUBIN URINE: NEGATIVE
GLUCOSE, UA: NEGATIVE mg/dL
KETONES UR: NEGATIVE mg/dL
Nitrite: NEGATIVE
PH: 5 (ref 5.0–8.0)
PROTEIN: NEGATIVE mg/dL
Specific Gravity, Urine: 1.012 (ref 1.005–1.030)

## 2015-04-18 LAB — URINE MICROSCOPIC-ADD ON

## 2015-04-18 LAB — CBC
HCT: 23.1 % — ABNORMAL LOW (ref 39.0–52.0)
HEMOGLOBIN: 7.7 g/dL — AB (ref 13.0–17.0)
MCH: 27.5 pg (ref 26.0–34.0)
MCHC: 33.3 g/dL (ref 30.0–36.0)
MCV: 82.5 fL (ref 78.0–100.0)
PLATELETS: 201 10*3/uL (ref 150–400)
RBC: 2.8 MIL/uL — AB (ref 4.22–5.81)
RDW: 16.3 % — ABNORMAL HIGH (ref 11.5–15.5)
WBC: 11.8 10*3/uL — ABNORMAL HIGH (ref 4.0–10.5)

## 2015-04-18 LAB — RENAL FUNCTION PANEL
ALBUMIN: 2.4 g/dL — AB (ref 3.5–5.0)
Anion gap: 15 (ref 5–15)
BUN: 55 mg/dL — ABNORMAL HIGH (ref 6–20)
CALCIUM: 9.2 mg/dL (ref 8.9–10.3)
CO2: 28 mmol/L (ref 22–32)
CREATININE: 2.42 mg/dL — AB (ref 0.61–1.24)
Chloride: 96 mmol/L — ABNORMAL LOW (ref 101–111)
GFR calc non Af Amer: 32 mL/min — ABNORMAL LOW (ref >60)
GFR, EST AFRICAN AMERICAN: 38 mL/min — AB (ref >60)
GLUCOSE: 100 mg/dL — AB (ref 65–99)
PHOSPHORUS: 4.4 mg/dL (ref 2.5–4.6)
Potassium: 3.1 mmol/L — ABNORMAL LOW (ref 3.5–5.1)
SODIUM: 139 mmol/L (ref 135–145)

## 2015-04-18 NOTE — Progress Notes (Signed)
Subjective:  Patient seen and evaluated during hemodialysis treatment this a.m. Appears to be tolerating well at the moment. BUN and creatinine do appear to be trending down however.   Objective:  Vital signs in last 24 hours:  Temperature 98.3 pulse 11 respirations 20 blood pressure 137/86   Physical Exam: General: Laying in  the bed, no acute distress  HEENT anicteric  Neck Tracheostomy in place, trach collar at present  Pulm/lungs Coarse breath sounds bilaterally  CVS/Heart Regular, no rub or gallop  Abdomen:  Soft, nontender  Extremities: Trace peripheral edema  Neurologic: Awake, not following commands consistently  Skin: No acute rashes  Access: Right IJ PermCath. March 31       Basic Metabolic Panel:   Recent Labs Lab 04/12/15 [id number] 04/15/15 0820 04/16/15 0534 04/16/15 1813 04/18/15 0528  NA 138 141 142  --  139  K 3.5 2.8* 2.9* 3.5 3.1*  CL 99* 100* 99*  --  96*  CO2 24 28 28   --  28  GLUCOSE 127* 114* 107*  --  100*  BUN 108* 64* 73*  --  55*  CREATININE 4.11* 2.82* 2.94*  --  2.42*  CALCIUM 8.8* 8.9 9.1  --  9.2  PHOS 8.0*  --  4.8*  --  4.4     CBC:  Recent Labs Lab 04/15/15 0820 04/16/15 0534 04/18/15 0528  WBC 13.8* 12.4* 11.8*  HGB 8.0* 7.8* 7.7*  HCT 23.3* 22.9* 23.1*  MCV 82.6 82.1 82.5  PLT 101* 140* 201      Microbiology:  Recent Results (from the past 720 hour(s))  Culture, respiratory (NON-Expectorated)     Status: None   Collection Time: 03/20/15  3:08 PM  Result Value Ref Range Status   Specimen Description BRONCHIAL ALVEOLAR LAVAGE  Final   Special Requests NONE  Final   Gram Stain   Final    MODERATE WBC PRESENT, PREDOMINANTLY PMN NO SQUAMOUS EPITHELIAL CELLS SEEN NO ORGANISMS SEEN Performed at [facility]    Culture   Final    FEW YEAST CONSISTENT WITH CANDIDA SPECIES Performed at [facility]    Report Status 03/23/2015 FINAL  Final  Culture, Urine     Status: None   Collection Time:  03/21/15  3:19 PM  Result Value Ref Range Status   Specimen Description URINE, RANDOM  Final   Special Requests NONE  Final   Culture NO GROWTH 1 DAY  Final   Report Status 03/22/2015 FINAL  Final  Culture, blood (routine x 2)     Status: None   Collection Time: 03/21/15  6:19 PM  Result Value Ref Range Status   Specimen Description BLOOD RIGHT ANTECUBITAL  Final   Special Requests BOTTLES DRAWN AEROBIC AND ANAEROBIC 10CC  Final   Culture NO GROWTH 5 DAYS  Final   Report Status 03/26/2015 FINAL  Final  Culture, blood (routine x 2)     Status: None   Collection Time: 03/21/15  6:33 PM  Result Value Ref Range Status   Specimen Description BLOOD RIGHT HAND  Final   Special Requests BOTTLES DRAWN AEROBIC ONLY 10CC  Final   Culture NO GROWTH 5 DAYS  Final   Report Status 03/26/2015 FINAL  Final  Culture, respiratory (NON-Expectorated)     Status: None   Collection Time: 04/01/15 10:46 AM  Result Value Ref Range Status   Specimen Description TRACHEAL ASPIRATE  Final   Special Requests Normal  Final   Gram Stain  Final    ABUNDANT WBC PRESENT,BOTH PMN AND MONONUCLEAR RARE SQUAMOUS EPITHELIAL CELLS PRESENT RARE YEAST Performed at [facility]    Culture   Final    FEW YEAST CONSISTENT WITH CANDIDA SPECIES Performed at [facility]    Report Status 04/03/2015 FINAL  Final  Culture, blood (routine x 2)     Status: None (Preliminary result)   Collection Time: 04/14/15  3:05 PM  Result Value Ref Range Status   Specimen Description BLOOD HEMODIALYSIS CATHETER  Final   Special Requests BOTTLES DRAWN AEROBIC AND ANAEROBIC 10CC  Final   Culture NO GROWTH 3 DAYS  Final   Report Status PENDING  Incomplete  Culture, blood (routine x 2)     Status: None (Preliminary result)   Collection Time: 04/14/15  3:10 PM  Result Value Ref Range Status   Specimen Description BLOOD HEMODIALYSIS CATHETER  Final   Special Requests BOTTLES DRAWN AEROBIC AND ANAEROBIC 10CC  Final    Culture NO GROWTH 3 DAYS  Final   Report Status PENDING  Incomplete    Coagulation Studies: No results for input(s): LABPROT, INR in the last 72 hours.  Urinalysis:  Recent Labs  04/18/15 0713  COLORURINE YELLOW  LABSPEC 1.012  PHURINE 5.0  GLUCOSEU NEGATIVE  HGBUR TRACE*  BILIRUBINUR NEGATIVE  KETONESUR NEGATIVE  PROTEINUR NEGATIVE  NITRITE NEGATIVE  LEUKOCYTESUR TRACE*      Imaging: No results found.   Medications:        Assessment/ Plan:  40 y.o. male with a PMHX of autism and sickle cell trait, was admitted on 04/07/2015 with acute respiratory failure and acute renal failure.   1. Acute renal failure - Likely secondary to ATN from vancomycin toxicity and sepsis - Dialysis started. Initially short term when patient was in [place], but may need it long term if no renal recovery - patient seen and evaluated during hemodialysis today. Tolerating well. BUN and creatinine appear to be trending down. Urine output was 1.6 L over the past 24 hours. We will reevaluate for dialysis on Monday as it is unclear as to whether he will require any further dialysis.  2. Hyperphosphatemia/SHPTH - Discussed with the dietitian. Change tube feeds to Nepro. - phosphorus 4.4 today. Continue to periodically monitor.  3. Acute respiratory failure - patient remains on the ventilator at the moment. Weaning efforts per pulmonary critical care.  4.  Hypokalemia. Potassium currently 3.1. Patient is on a 4 potassium bath today which should help to replete serum potassium.   LOS:  Venola Castello 4/7/20178:44 AM

## 2015-04-19 LAB — CULTURE, BLOOD (ROUTINE X 2)
Culture: NO GROWTH
Culture: NO GROWTH

## 2015-04-21 ENCOUNTER — Other Ambulatory Visit (HOSPITAL_COMMUNITY): Payer: Self-pay

## 2015-04-21 DIAGNOSIS — N289 Disorder of kidney and ureter, unspecified: Secondary | ICD-10-CM

## 2015-04-21 LAB — CBC
HCT: 25.7 % — ABNORMAL LOW (ref 39.0–52.0)
HEMOGLOBIN: 8.1 g/dL — AB (ref 13.0–17.0)
MCH: 26.3 pg (ref 26.0–34.0)
MCHC: 31.5 g/dL (ref 30.0–36.0)
MCV: 83.4 fL (ref 78.0–100.0)
PLATELETS: 407 10*3/uL — AB (ref 150–400)
RBC: 3.08 MIL/uL — ABNORMAL LOW (ref 4.22–5.81)
RDW: 16.5 % — AB (ref 11.5–15.5)
WBC: 8.9 10*3/uL (ref 4.0–10.5)

## 2015-04-21 LAB — URINE CULTURE: Culture: 20000 — AB

## 2015-04-21 LAB — RENAL FUNCTION PANEL
ALBUMIN: 2.6 g/dL — AB (ref 3.5–5.0)
ANION GAP: 14 (ref 5–15)
BUN: 45 mg/dL — AB (ref 6–20)
CALCIUM: 9.7 mg/dL (ref 8.9–10.3)
CO2: 29 mmol/L (ref 22–32)
CREATININE: 1.94 mg/dL — AB (ref 0.61–1.24)
Chloride: 98 mmol/L — ABNORMAL LOW (ref 101–111)
GFR calc Af Amer: 49 mL/min — ABNORMAL LOW (ref >60)
GFR calc non Af Amer: 42 mL/min — ABNORMAL LOW (ref >60)
GLUCOSE: 113 mg/dL — AB (ref 65–99)
PHOSPHORUS: 4 mg/dL (ref 2.5–4.6)
Potassium: 3.2 mmol/L — ABNORMAL LOW (ref 3.5–5.1)
SODIUM: 141 mmol/L (ref 135–145)

## 2015-04-21 NOTE — Progress Notes (Signed)
Subjective:   Hemodialysis earlier today. Tolerated treatment well.   Sister at bedside   Objective:  Vital signs in last 24 hours:  Temperature 98.8 pulse 106 respirations 20 blood pressure 121/77   Physical Exam: General: Laying in  the bed, no acute distress  HEENT anicteric  Neck Tracheostomy in place, trach collar at present  Pulm/lungs Coarse breath sounds bilaterally  CVS/Heart Regular, no rub or gallop  Abdomen:  Soft, nontender  Extremities: Trace peripheral edema  Neurologic: Awake, not following commands consistently  Skin: No acute rashes  Access: Right IJ PermCath. March 31       Basic Metabolic Panel:   Recent Labs Lab 04/15/15 0820 04/16/15 0534 04/16/15 1813 04/18/15 0528 04/21/15 0530  NA 141 142  --  139 141  K 2.8* 2.9* 3.5 3.1* 3.2*  CL 100* 99*  --  96* 98*  CO2 28 28  --  28 29  GLUCOSE 114* 107*  --  100* 113*  BUN 64* 73*  --  55* 45*  CREATININE 2.82* 2.94*  --  2.42* 1.94*  CALCIUM 8.9 9.1  --  9.2 9.7  PHOS  --  4.8*  --  4.4 4.0     CBC:  Recent Labs Lab 04/15/15 0820 04/16/15 0534 04/18/15 0528 04/21/15 0530  WBC 13.8* 12.4* 11.8* 8.9  HGB 8.0* 7.8* 7.7* 8.1*  HCT 23.3* 22.9* 23.1* 25.7*  MCV 82.6 82.1 82.5 83.4  PLT 101* 140* 201 407*      Microbiology:  Recent Results (from the past 720 hour(s))  Culture, respiratory (NON-Expectorated)     Status: None   Collection Time: 04/01/15 10:46 AM  Result Value Ref Range Status   Specimen Description TRACHEAL ASPIRATE  Final   Special Requests Normal  Final   Gram Stain   Final    ABUNDANT WBC PRESENT,BOTH PMN AND MONONUCLEAR RARE SQUAMOUS EPITHELIAL CELLS PRESENT RARE YEAST Performed at [facility]    Culture   Final    FEW YEAST CONSISTENT WITH CANDIDA SPECIES Performed at [facility]    Report Status 04/03/2015 FINAL  Final  Culture, blood (routine x 2)     Status: None   Collection Time: 04/14/15  3:05 PM  Result Value Ref Range Status    Specimen Description BLOOD HEMODIALYSIS CATHETER  Final   Special Requests BOTTLES DRAWN AEROBIC AND ANAEROBIC 10CC  Final   Culture NO GROWTH 5 DAYS  Final   Report Status 04/19/2015 FINAL  Final  Culture, blood (routine x 2)     Status: None   Collection Time: 04/14/15  3:10 PM  Result Value Ref Range Status   Specimen Description BLOOD HEMODIALYSIS CATHETER  Final   Special Requests BOTTLES DRAWN AEROBIC AND ANAEROBIC 10CC  Final   Culture NO GROWTH 5 DAYS  Final   Report Status 04/19/2015 FINAL  Final  Culture, Urine     Status: Abnormal   Collection Time: 04/18/15  7:14 AM  Result Value Ref Range Status   Specimen Description URINE, RANDOM  Final   Special Requests NONE  Final   Culture (A)  Final    20,000 COLONIES/mL VANCOMYCIN RESISTANT ENTEROCOCCUS 10,000 COLONIES/mL YEAST    Report Status 04/21/2015 FINAL  Final   Organism ID, Bacteria VANCOMYCIN RESISTANT ENTEROCOCCUS (A)  Final      Susceptibility   Vancomycin resistant enterococcus - MIC*    AMPICILLIN >=32 RESISTANT Resistant     LEVOFLOXACIN >=8 RESISTANT Resistant     NITROFURANTOIN  128 RESISTANT Resistant     VANCOMYCIN >=32 RESISTANT Resistant     LINEZOLID 2 SENSITIVE Sensitive     * 20,000 COLONIES/mL VANCOMYCIN RESISTANT ENTEROCOCCUS    Coagulation Studies: No results for input(s): LABPROT, INR in the last 72 hours.  Urinalysis: No results for input(s): COLORURINE, LABSPEC, PHURINE, GLUCOSEU, HGBUR, BILIRUBINUR, KETONESUR, PROTEINUR, UROBILINOGEN, NITRITE, LEUKOCYTESUR in the last 72 hours.  Invalid input(s): APPERANCEUR    Imaging: No results found.   Medications:        Assessment/ Plan:  40 y.o.black male with a PMHX of autism and sickle cell trait, was admitted on 04/07/2015 with acute respiratory failure and acute renal failure.   1. Acute renal failure: Dialysis started. Initially short term when patient was in [place], but may need it long term if no renal recovery. Likely  secondary to ATN from vancomycin toxicity and sepsis. Baseline creatinine of 1.02 from 02/28/15 - hold dialysis and monitor renal function.   2. Hyperphosphatemia/SHPTH Continue Nepro.  3. Anemia with kidney failure: hemoglobin 8.1 with sickle cell trait - not currently on aranesp   LOS:  Kiyara Bouffard 4/10/20175:46 PM

## 2015-04-21 NOTE — Progress Notes (Signed)
Name: Juan RipperRobert Cameron MRN: [id number] DOB: 1975/11/27    ADMISSION DATE:  04/07/2015 CONSULTATION DATE:  3/28  [facility] :[facility]  CHIEF COMPLAINT:Vent magagement  BRIEF PATIENT DESCRIPTION: 40 yo aa m  SIGNIFICANT EVENTS   SIGNIFICANT EVENTS: 2/28 readmit to hospital  03/12/15 Intubated 3/1 Bronchoscopy  3/14- unable to ventilate or oxygenate> DNR 3/15- pos 6.8 liters 3/16- HD started  LINES/TUBES: Central line L IJ TLC>3/1 Trach 3/9>>> Rt ij hd 3/16>>> 3/26  3/21 vanc >> 3/24 3/21 meropenem >> 7 days  SIGNIFICANT EVENTS: 2/28 readmit to hospital  03/12/15 Intubated 3/1 Bronchoscopy  3/14- unable to ventilate or oxygenate> DNR 3/15- pos 6.8 liters 3/16- HD started 4/5 on t collar 24/7 4/10- remains on TCm, PMV good    HISTORY OF PRESENT ILLNESS:   40 yo autistic male who was [facility]15 with resp distress secondary to pneumonia. He proved refractory to abx and conservative treatment and was moved to ICU 3/01 with worsening pulmonary mechanics and required intubation 3/01, tracheostomy 3/9, HD cath 3/16 and agressive antibiotics, HD support and renal interventions. Despite aggressive treatment his chances of any meaningful recovery are almost non existent. [facility] and PCCM asked to manage the ventilator.   SUBJECTIVE:  More alert  VITAL SIGNS: No fever 140/50 95 98% 22 PHYSICAL EXAMINATION: General: Obese AAM on TC Neuro:  Follows commands well HEENT:  Trach-> t collar Cardiovascular:  HSR RRR Lungs: CTA Abdomen:  Soft, BS wnl, no r/g  Musculoskeletal:  intact Skin:  Warm and dry   Recent Labs Lab 04/16/15 0534 04/16/15 1813 04/18/15 0528 04/21/15 0530  NA 142  --  139 141  K 2.9* 3.5 3.1* 3.2*  CL 99*  --  96* 98*  CO2 28  --  28 29  BUN 73*  --  55* 45*  CREATININE 2.94*  --  2.42* 1.94*  GLUCOSE 107*  --  100* 113*    Recent Labs Lab 04/16/15 0534 04/18/15 0528 04/21/15 0530  HGB 7.8*  7.7* 8.1*  HCT 22.9* 23.1* 25.7*  WBC 12.4* 11.8* 8.9  PLT 140* 201 407*   No results found.  ASSESSMENT:     Acute respiratory failure with hypoxia (HCC)   CAP (community acquired pneumonia)   Autism   Acute lung injury   Left ventricular diastolic dysfunction, NYHA class 2   ARDS (adult respiratory distress syndrome) (HCC)   Acute renal insufficiency   Obesity   Pressure ulcer   Tracheostomy dependent (HCC)   Ventilator dependence [facility](HCC)  Discussion:  40 yo autistic male who [facility] with resp distress secondary to pneumonia. He proved refractory to abx and conservative treatment and was moved to ICU 3/01 with worsening pulmonary mechanics and required intubation 3/01, tracheostomy 3/9, HD cath 3/16 and agressive antibiotics, HD support and renal interventions. Despite aggressive treatment his chances of any meaningful recovery are almost non existent. [facility] and PCCM asked to manage the ventilator.   PULMONARY A: ARDS from severe CAP vs HCAP; Improving but still with significant lung injury New HCAP 3/21 Suspected undiagnosed OSA Pulm edema > improving P:  Remain on trach colar Push PMV all day long He voice is weak still Is reasonable to push HD lnger term given neuro and pulmonary improvement Diet Cuff don all day Push further mobility Once his strength is improved would conisder downzie and decanulation likely will occur  Juan Rossettianiel J. Tyson AliasFeinstein,  MD, FACP Pgr: [facility]

## 2015-04-22 LAB — RENAL FUNCTION PANEL
ALBUMIN: 2.9 g/dL — AB (ref 3.5–5.0)
Anion gap: 13 (ref 5–15)
BUN: 27 mg/dL — AB (ref 6–20)
CHLORIDE: 100 mmol/L — AB (ref 101–111)
CO2: 25 mmol/L (ref 22–32)
CREATININE: 1.86 mg/dL — AB (ref 0.61–1.24)
Calcium: 9.5 mg/dL (ref 8.9–10.3)
GFR calc Af Amer: 52 mL/min — ABNORMAL LOW (ref >60)
GFR, EST NON AFRICAN AMERICAN: 45 mL/min — AB (ref >60)
GLUCOSE: 98 mg/dL (ref 65–99)
Phosphorus: 3.2 mg/dL (ref 2.5–4.6)
Potassium: 4.1 mmol/L (ref 3.5–5.1)
SODIUM: 138 mmol/L (ref 135–145)

## 2015-04-23 LAB — RENAL FUNCTION PANEL
ALBUMIN: 3 g/dL — AB (ref 3.5–5.0)
Anion gap: 13 (ref 5–15)
BUN: 29 mg/dL — AB (ref 6–20)
CALCIUM: 10 mg/dL (ref 8.9–10.3)
CO2: 27 mmol/L (ref 22–32)
Chloride: 100 mmol/L — ABNORMAL LOW (ref 101–111)
Creatinine, Ser: 2.01 mg/dL — ABNORMAL HIGH (ref 0.61–1.24)
GFR calc Af Amer: 47 mL/min — ABNORMAL LOW (ref >60)
GFR calc non Af Amer: 41 mL/min — ABNORMAL LOW (ref >60)
GLUCOSE: 100 mg/dL — AB (ref 65–99)
PHOSPHORUS: 3.3 mg/dL (ref 2.5–4.6)
Potassium: 3.6 mmol/L (ref 3.5–5.1)
SODIUM: 140 mmol/L (ref 135–145)

## 2015-04-23 LAB — CBC
HCT: 29.2 % — ABNORMAL LOW (ref 39.0–52.0)
Hemoglobin: 9.5 g/dL — ABNORMAL LOW (ref 13.0–17.0)
MCH: 27.3 pg (ref 26.0–34.0)
MCHC: 32.5 g/dL (ref 30.0–36.0)
MCV: 83.9 fL (ref 78.0–100.0)
PLATELETS: 480 10*3/uL — AB (ref 150–400)
RBC: 3.48 MIL/uL — ABNORMAL LOW (ref 4.22–5.81)
RDW: 16.8 % — ABNORMAL HIGH (ref 11.5–15.5)
WBC: 9.6 10*3/uL (ref 4.0–10.5)

## 2015-04-23 NOTE — Progress Notes (Signed)
Subjective:   Hemodialysis held today  Sister at bedside   Objective:  Vital signs in last 24 hours:  Temperature 99.5 pulse 111 respirations 20 blood pressure 127/84 SpO2 99%   Physical Exam: General: Laying in  the bed, no acute distress  HEENT anicteric  Neck Tracheostomy with trach collar  Pulm/lungs Coarse breath sounds bilaterally  CVS/Heart tachycardia  Abdomen:  Soft, nontender  Extremities: Trace peripheral edema  Neurologic: Awake, not following commands consistently  Skin: No acute rashes  Access: Right IJ PermCath. March 31       Basic Metabolic Panel:   Recent Labs Lab 04/16/15 1813  04/18/15 0528 04/21/15 0530 04/22/15 0704 04/23/15 0817  NA  --   --  139 141 138 140  K 3.5  --  3.1* 3.2* 4.1 3.6  CL  --   --  96* 98* 100* 100*  CO2  --   --  GLUCOSE  --   --  100* 113* 98 100*  BUN  --   --  55* 45* 27* 29*  CREATININE  --   --  2.42* 1.94* 1.86* 2.01*  CALCIUM  --   < > 9.2 9.7 9.5 10.0  PHOS  --   --  4.4 4.0 3.2 3.3  < > = values in this interval not displayed.   CBC:  Recent Labs Lab 04/18/15 0528 04/21/15 0530 04/23/15 0817  WBC 11.8* 8.9 9.6  HGB 7.7* 8.1* 9.5*  HCT 23.1* 25.7* 29.2*  MCV 82.5 83.4 83.9  PLT 201 407* 480*      Microbiology:  Recent Results (from the past 720 hour(s))  Culture, respiratory (NON-Expectorated)     Status: None   Collection Time: 04/01/15 10:46 AM  Result Value Ref Range Status   Specimen Description TRACHEAL ASPIRATE  Final   Special Requests Normal  Final   Gram Stain   Final    ABUNDANT WBC PRESENT,BOTH PMN AND MONONUCLEAR RARE SQUAMOUS EPITHELIAL CELLS PRESENT RARE YEAST Performed at [facility]    Culture   Final    FEW YEAST CONSISTENT WITH CANDIDA SPECIES Performed at [facility]    Report Status 04/03/2015 FINAL  Final  Culture, blood (routine x 2)     Status: None   Collection Time: 04/14/15  3:05 PM  Result Value Ref Range Status   Specimen Description BLOOD HEMODIALYSIS CATHETER  Final   Special Requests BOTTLES DRAWN AEROBIC AND ANAEROBIC 10CC  Final   Culture NO GROWTH 5 DAYS  Final   Report Status 04/19/2015 FINAL  Final  Culture, blood (routine x 2)     Status: None   Collection Time: 04/14/15  3:10 PM  Result Value Ref Range Status   Specimen Description BLOOD HEMODIALYSIS CATHETER  Final   Special Requests BOTTLES DRAWN AEROBIC AND ANAEROBIC 10CC  Final   Culture NO GROWTH 5 DAYS  Final   Report Status 04/19/2015 FINAL  Final  Culture, Urine     Status: Abnormal   Collection Time: 04/18/15  7:14 AM  Result Value Ref Range Status   Specimen Description URINE, RANDOM  Final   Special Requests NONE  Final   Culture (A)  Final    20,000 COLONIES/mL VANCOMYCIN RESISTANT ENTEROCOCCUS 10,000 COLONIES/mL YEAST    Report Status 04/21/2015 FINAL  Final   Organism ID, Bacteria VANCOMYCIN RESISTANT ENTEROCOCCUS (A)  Final      Susceptibility   Vancomycin resistant enterococcus - MIC*  AMPICILLIN >=32 RESISTANT Resistant     LEVOFLOXACIN >=8 RESISTANT Resistant     NITROFURANTOIN 128 RESISTANT Resistant     VANCOMYCIN >=32 RESISTANT Resistant     LINEZOLID 2 SENSITIVE Sensitive     * 20,000 COLONIES/mL VANCOMYCIN RESISTANT ENTEROCOCCUS    Coagulation Studies: No results for input(s): LABPROT, INR in the last 72 hours.  Urinalysis: No results for input(s): COLORURINE, LABSPEC, PHURINE, GLUCOSEU, HGBUR, BILIRUBINUR, KETONESUR, PROTEINUR, UROBILINOGEN, NITRITE, LEUKOCYTESUR in the last 72 hours.  Invalid input(s): APPERANCEUR    Imaging: No results found.   Medications:        Assessment/ Plan:  40 y.o.black male with a PMHX of autism and sickle cell trait, was admitted on 04/07/2015 with acute respiratory failure and acute renal failure.   1. Acute renal failure: Dialysis started. Initially short term when patient was in [place], but may need it long term if no renal recovery. Likely secondary  to ATN from vancomycin toxicity and sepsis. Baseline creatinine of 1.02 from 02/28/15 - hold dialysis and monitor renal function. Last dialysis was 4/10.   2. Secondary Hyperparathyroidism with Hyperphosphatemia: No PTH available. Phosphorus at goal. Corrected calcium upper level of normal.  Continue Nepro.  3. Anemia with kidney failure: hemoglobin 9.5 with sickle cell trait. With thrombocytosis. - not currently on aranesp   LOS:  Juan Cameron 4/12/20175:40 PM

## 2015-04-24 LAB — RENAL FUNCTION PANEL
ALBUMIN: 2.9 g/dL — AB (ref 3.5–5.0)
ANION GAP: 14 (ref 5–15)
BUN: 26 mg/dL — ABNORMAL HIGH (ref 6–20)
CALCIUM: 9.5 mg/dL (ref 8.9–10.3)
CHLORIDE: 99 mmol/L — AB (ref 101–111)
CO2: 26 mmol/L (ref 22–32)
Creatinine, Ser: 1.96 mg/dL — ABNORMAL HIGH (ref 0.61–1.24)
GFR calc Af Amer: 49 mL/min — ABNORMAL LOW (ref >60)
GFR, EST NON AFRICAN AMERICAN: 42 mL/min — AB (ref >60)
Glucose, Bld: 141 mg/dL — ABNORMAL HIGH (ref 65–99)
POTASSIUM: 3.7 mmol/L (ref 3.5–5.1)
Phosphorus: 4.1 mg/dL (ref 2.5–4.6)
Sodium: 139 mmol/L (ref 135–145)

## 2015-04-25 LAB — CBC
HEMATOCRIT: 27.9 % — AB (ref 39.0–52.0)
Hemoglobin: 9.4 g/dL — ABNORMAL LOW (ref 13.0–17.0)
MCH: 27.8 pg (ref 26.0–34.0)
MCHC: 33.7 g/dL (ref 30.0–36.0)
MCV: 82.5 fL (ref 78.0–100.0)
PLATELETS: 505 10*3/uL — AB (ref 150–400)
RBC: 3.38 MIL/uL — AB (ref 4.22–5.81)
RDW: 16.3 % — ABNORMAL HIGH (ref 11.5–15.5)
WBC: 9.8 10*3/uL (ref 4.0–10.5)

## 2015-04-25 LAB — RENAL FUNCTION PANEL
Albumin: 3 g/dL — ABNORMAL LOW (ref 3.5–5.0)
Anion gap: 14 (ref 5–15)
BUN: 24 mg/dL — ABNORMAL HIGH (ref 6–20)
CHLORIDE: 98 mmol/L — AB (ref 101–111)
CO2: 25 mmol/L (ref 22–32)
Calcium: 9.4 mg/dL (ref 8.9–10.3)
Creatinine, Ser: 1.61 mg/dL — ABNORMAL HIGH (ref 0.61–1.24)
GFR, EST NON AFRICAN AMERICAN: 53 mL/min — AB (ref >60)
Glucose, Bld: 94 mg/dL (ref 65–99)
PHOSPHORUS: 4.9 mg/dL — AB (ref 2.5–4.6)
POTASSIUM: 3.8 mmol/L (ref 3.5–5.1)
Sodium: 137 mmol/L (ref 135–145)

## 2015-04-25 NOTE — Progress Notes (Signed)
Subjective:   Patient is doing much better overall. He is sitting up in a chair apple Breathing is good. Janina Mayo is capped. Urine output recorded at 900 cc  Objective:  Vital signs in last 24 hours:  Temperature 97.6 pulse 105 respirations 21 blood pressure 117/65   Physical Exam: General: Sitting up in the chair, no acute distress  HEENT anicteric  Neck Tracheostomy with speech valve  Pulm/lungs Coarse breath sounds bilaterally  CVS/Heart tachycardia  Abdomen:  Soft, nontender  Extremities: Trace peripheral edema  Neurologic: Awake, able to follow simple commands  Skin: No acute rashes  Access: Right IJ PermCath. March 31       Basic Metabolic Panel:   Recent Labs Lab 04/21/15 0530 04/22/15 0704 04/23/15 0817 04/24/15 0704 04/25/15 0749  NA 141 138 140 139 137  K 3.2* 4.1 3.6 3.7 3.8  CL 98* 100* 100* 99* 98*  CO2 GLUCOSE 113* 98 100* 141* 94  BUN 45* 27* 29* 26* 24*  CREATININE 1.94* 1.86* 2.01* 1.96* 1.61*  CALCIUM 9.7 9.5 10.0 9.5 9.4  PHOS 4.0 3.2 3.3 4.1 4.9*     CBC:  Recent Labs Lab 04/21/15 0530 04/23/15 0817 04/25/15 0749  WBC 8.9 9.6 9.8  HGB 8.1* 9.5* 9.4*  HCT 25.7* 29.2* 27.9*  MCV 83.4 83.9 82.5  PLT 407* 480* 505*      Microbiology:  Recent Results (from the past 720 hour(s))  Culture, respiratory (NON-Expectorated)     Status: None   Collection Time: 04/01/15 10:46 AM  Result Value Ref Range Status   Specimen Description TRACHEAL ASPIRATE  Final   Special Requests Normal  Final   Gram Stain   Final    ABUNDANT WBC PRESENT,BOTH PMN AND MONONUCLEAR RARE SQUAMOUS EPITHELIAL CELLS PRESENT RARE YEAST Performed at [facility]    Culture   Final    FEW YEAST CONSISTENT WITH CANDIDA SPECIES Performed at [facility]    Report Status 04/03/2015 FINAL  Final  Culture, blood (routine x 2)     Status: None   Collection Time: 04/14/15  3:05 PM  Result Value Ref Range Status   Specimen Description  BLOOD HEMODIALYSIS CATHETER  Final   Special Requests BOTTLES DRAWN AEROBIC AND ANAEROBIC 10CC  Final   Culture NO GROWTH 5 DAYS  Final   Report Status 04/19/2015 FINAL  Final  Culture, blood (routine x 2)     Status: None   Collection Time: 04/14/15  3:10 PM  Result Value Ref Range Status   Specimen Description BLOOD HEMODIALYSIS CATHETER  Final   Special Requests BOTTLES DRAWN AEROBIC AND ANAEROBIC 10CC  Final   Culture NO GROWTH 5 DAYS  Final   Report Status 04/19/2015 FINAL  Final  Culture, Urine     Status: Abnormal   Collection Time: 04/18/15  7:14 AM  Result Value Ref Range Status   Specimen Description URINE, RANDOM  Final   Special Requests NONE  Final   Culture (A)  Final    20,000 COLONIES/mL VANCOMYCIN RESISTANT ENTEROCOCCUS 10,000 COLONIES/mL YEAST    Report Status 04/21/2015 FINAL  Final   Organism ID, Bacteria VANCOMYCIN RESISTANT ENTEROCOCCUS (A)  Final      Susceptibility   Vancomycin resistant enterococcus - MIC*    AMPICILLIN >=32 RESISTANT Resistant     LEVOFLOXACIN >=8 RESISTANT Resistant     NITROFURANTOIN 128 RESISTANT Resistant     VANCOMYCIN >=32 RESISTANT Resistant     LINEZOLID 2  SENSITIVE Sensitive     * 20,000 COLONIES/mL VANCOMYCIN RESISTANT ENTEROCOCCUS    Coagulation Studies: No results for input(s): LABPROT, INR in the last 72 hours.  Urinalysis: No results for input(s): COLORURINE, LABSPEC, PHURINE, GLUCOSEU, HGBUR, BILIRUBINUR, KETONESUR, PROTEINUR, UROBILINOGEN, NITRITE, LEUKOCYTESUR in the last 72 hours.  Invalid input(s): APPERANCEUR    Imaging: No results found.   Medications:        Assessment/ Plan:  40 y.o.black male with a PMHX of autism and sickle cell trait, was admitted on 04/07/2015 with acute respiratory failure and acute renal failure.   1. Acute renal failure: Dialysis started. Initially short term when patient was in [place], but may need it long term if no renal recovery. Likely secondary to ATN from  vancomycin toxicity and sepsis. Baseline creatinine of 1.02 from 02/28/15 - hold dialysis and monitor renal function. Last dialysis was 4/10.  - serum creatinine is down to 1.6. - If labs remain in acceptable range, we will get dialysis catheter removed early next week.  2. Anemia with sickle cell trait. With thrombocytosis. - not currently on aranesp Hemoglobin 9.4   LOS:  Juan Cameron 4/14/20173:50 PM

## 2015-04-28 LAB — BASIC METABOLIC PANEL
Anion gap: 13 (ref 5–15)
BUN: 18 mg/dL (ref 6–20)
CHLORIDE: 105 mmol/L (ref 101–111)
CO2: 24 mmol/L (ref 22–32)
CREATININE: 1.31 mg/dL — AB (ref 0.61–1.24)
Calcium: 9.2 mg/dL (ref 8.9–10.3)
GFR calc non Af Amer: >60 mL/min (ref >60)
GLUCOSE: 92 mg/dL (ref 65–99)
Potassium: 3.6 mmol/L (ref 3.5–5.1)
Sodium: 142 mmol/L (ref 135–145)

## 2015-04-28 LAB — TSH: TSH: 1.494 u[IU]/mL (ref 0.350–4.500)

## 2015-04-28 NOTE — Progress Notes (Signed)
D/w Dr Sharyon MedicusHijazi - pccm to sign off  Dr. Kalman ShanMurali Kortney Schoenfelder, M.D., [facility]P Pulmonary and Critical Care Medicine Staff Physician [facility] [place] [facility] Pager: [phone], If no answer or between  15:00h - 7:00h: call [phone]  04/28/2015 11:27 AM

## 2015-04-28 NOTE — Progress Notes (Signed)
Subjective:   Patient is doing much better overall.   Breathing is good. Janina Mayorach has been removed Urine output recorded at 800 cc  Objective:  Vital signs in last 24 hours:   temperature 98.8, pulse 102, respirations 15, blood pressure 125/70   Physical Exam: General: Laying in the bed,  no acute distress  HEENT anicteric  Neck Tracheostomy site is covered  Pulm/lungs Normal effort, clear anteriorly and laterally  CVS/Heart tachycardia  Abdomen:  Soft, nontender  Extremities: No peripheral edema  Neurologic: Awake, able to follow simple commands  Skin: No acute rashes  Access: Right IJ PermCath. March 31       Basic Metabolic Panel:   Recent Labs Lab 04/22/15 0704 04/23/15 0817 04/24/15 0704 04/25/15 0749 04/28/15 0647  NA 138 140 139 137 142  K 4.1 3.6 3.7 3.8 3.6  CL 100* 100* 99* 98* 105  CO2 25 27 26 25 24   GLUCOSE 98 100* 141* 94 92  BUN 27* 29* 26* 24* 18  CREATININE 1.86* 2.01* 1.96* 1.61* 1.31*  CALCIUM 9.5 10.0 9.5 9.4 9.2  PHOS 3.2 3.3 4.1 4.9*  --      CBC:  Recent Labs Lab 04/23/15 0817 04/25/15 0749  WBC 9.6 9.8  HGB 9.5* 9.4*  HCT 29.2* 27.9*  MCV 83.9 82.5  PLT 480* 505*      Microbiology:  Recent Results (from the past 720 hour(s))  Culture, respiratory (NON-Expectorated)     Status: None   Collection Time: 04/01/15 10:46 AM  Result Value Ref Range Status   Specimen Description TRACHEAL ASPIRATE  Final   Special Requests Normal  Final   Gram Stain   Final    ABUNDANT WBC PRESENT,BOTH PMN AND MONONUCLEAR RARE SQUAMOUS EPITHELIAL CELLS PRESENT RARE YEAST [facility]formed at [facility]    Culture   Final    FEW YEAST CONSISTENT WITH CANDIDA SPECIES Performed at [facility]    Report Status 04/03/2015 FINAL  Final  Culture, blood (routine x 2)     Status: None   Collection Time: 04/14/15  3:05 PM  Result Value Ref Range Status   Specimen Description BLOOD HEMODIALYSIS CATHETER  Final   Special Requests  BOTTLES DRAWN AEROBIC AND ANAEROBIC 10CC  Final   Culture NO GROWTH 5 DAYS  Final   Report Status 04/19/2015 FINAL  Final  Culture, blood (routine x 2)     Status: None   Collection Time: 04/14/15  3:10 PM  Result Value Ref Range Status   Specimen Description BLOOD HEMODIALYSIS CATHETER  Final   Special Requests BOTTLES DRAWN AEROBIC AND ANAEROBIC 10CC  Final   Culture NO GROWTH 5 DAYS  Final   Report Status 04/19/2015 FINAL  Final  Culture, Urine     Status: Abnormal   Collection Time: 04/18/15  7:14 AM  Result Value Ref Range Status   Specimen Description URINE, RANDOM  Final   Special Requests NONE  Final   Culture (A)  Final    20,000 COLONIES/mL VANCOMYCIN RESISTANT ENTEROCOCCUS 10,000 COLONIES/mL YEAST    Report Status 04/21/2015 FINAL  Final   Organism ID, Bacteria VANCOMYCIN RESISTANT ENTEROCOCCUS (A)  Final      Susceptibility   Vancomycin resistant enterococcus - MIC*    AMPICILLIN >=32 RESISTANT Resistant     LEVOFLOXACIN >=8 RESISTANT Resistant     NITROFURANTOIN 128 RESISTANT Resistant     VANCOMYCIN >=32 RESISTANT Resistant     LINEZOLID 2 SENSITIVE Sensitive     *  20,000 COLONIES/mL VANCOMYCIN RESISTANT ENTEROCOCCUS    Coagulation Studies: No results for input(s): LABPROT, INR in the last 72 hours.  Urinalysis: No results for input(s): COLORURINE, LABSPEC, PHURINE, GLUCOSEU, HGBUR, BILIRUBINUR, KETONESUR, PROTEINUR, UROBILINOGEN, NITRITE, LEUKOCYTESUR in the last 72 hours.  Invalid input(s): APPERANCEUR    Imaging: No results found.   Medications:        Assessment/ Plan:  40 y.o.black male with a PMHX of autism and sickle cell trait, was admitted on 04/07/2015 with acute respiratory failure and acute renal failure.   1. Acute renal failure: Dialysis started. Initially short term when patient was in , but may need it long term if no renal recovery. Likely secondary to ATN from vancomycin toxicity and sepsis. Baseline creatinine of 1.02  from 02/28/15 -  Last dialysis was 4/10.  - serum creatinine has improved to 1.31 - Consult vascular radiology to remove PermCath  2. Anemia with sickle cell trait. With thrombocytosis. - not currently on aranesp Hemoglobin 9.4  Will sign off Please call if any further assistance is necessary   LOS:  Brit Carbonell 4/17/20173:42 PM

## 2015-04-29 LAB — T4, FREE: FREE T4: 0.94 ng/dL (ref 0.61–1.12)

## 2015-04-30 ENCOUNTER — Other Ambulatory Visit (HOSPITAL_COMMUNITY): Payer: Self-pay

## 2015-04-30 LAB — T3 UPTAKE: T3 UPTAKE RATIO: 30 % (ref 24–39)

## 2015-04-30 MED ORDER — CHLORHEXIDINE GLUCONATE 4 % EX LIQD
CUTANEOUS | Status: AC
  Filled 2015-04-30: qty 15

## 2015-04-30 MED ORDER — LIDOCAINE HCL 1 % IJ SOLN
INTRAMUSCULAR | Status: AC
  Filled 2015-04-30: qty 20

## 2015-04-30 NOTE — Procedures (Signed)
Removal R IJ HD catheter No complication No blood loss. [facility].

## 2015-05-01 LAB — BASIC METABOLIC PANEL
ANION GAP: 13 (ref 5–15)
BUN: 9 mg/dL (ref 6–20)
CO2: 24 mmol/L (ref 22–32)
Calcium: 9.3 mg/dL (ref 8.9–10.3)
Chloride: 103 mmol/L (ref 101–111)
Creatinine, Ser: 1.15 mg/dL (ref 0.61–1.24)
GFR calc Af Amer: >60 mL/min (ref >60)
GLUCOSE: 95 mg/dL (ref 65–99)
POTASSIUM: 4 mmol/L (ref 3.5–5.1)
Sodium: 140 mmol/L (ref 135–145)

## 2015-05-01 LAB — CBC
HEMATOCRIT: 28.5 % — AB (ref 39.0–52.0)
Hemoglobin: 9.2 g/dL — ABNORMAL LOW (ref 13.0–17.0)
MCH: 26.5 pg (ref 26.0–34.0)
MCHC: 32.3 g/dL (ref 30.0–36.0)
MCV: 82.1 fL (ref 78.0–100.0)
PLATELETS: 447 10*3/uL — AB (ref 150–400)
RBC: 3.47 MIL/uL — AB (ref 4.22–5.81)
RDW: 16 % — ABNORMAL HIGH (ref 11.5–15.5)
WBC: 12 10*3/uL — AB (ref 4.0–10.5)

## 2015-05-04 LAB — BASIC METABOLIC PANEL
ANION GAP: 11 (ref 5–15)
BUN: 8 mg/dL (ref 6–20)
CALCIUM: 9.3 mg/dL (ref 8.9–10.3)
CO2: 26 mmol/L (ref 22–32)
CREATININE: 1.11 mg/dL (ref 0.61–1.24)
Chloride: 110 mmol/L (ref 101–111)
Glucose, Bld: 112 mg/dL — ABNORMAL HIGH (ref 65–99)
Potassium: 4.4 mmol/L (ref 3.5–5.1)
SODIUM: 147 mmol/L — AB (ref 135–145)

## 2015-05-06 ENCOUNTER — Encounter: Payer: Self-pay | Admitting: *Deleted

## 2015-05-06 ENCOUNTER — Inpatient Hospital Stay (HOSPITAL_COMMUNITY)
Admission: RE | Admit: 2015-05-06 | Discharge: 2015-05-16 | DRG: 947 | Disposition: A | Discharge status: Other Acute Inpatient Hospital | Payer: Medicare Other | Source: Other Acute Inpatient Hospital | Attending: Physical Medicine & Rehabilitation | Admitting: Physical Medicine & Rehabilitation

## 2015-05-06 DIAGNOSIS — A419 Sepsis, unspecified organism: Secondary | ICD-10-CM | POA: Insufficient documentation

## 2015-05-06 DIAGNOSIS — R4189 Other symptoms and signs involving cognitive functions and awareness: Secondary | ICD-10-CM | POA: Insufficient documentation

## 2015-05-06 DIAGNOSIS — N289 Disorder of kidney and ureter, unspecified: Secondary | ICD-10-CM | POA: Diagnosis not present

## 2015-05-06 DIAGNOSIS — F840 Autistic disorder: Secondary | ICD-10-CM

## 2015-05-06 DIAGNOSIS — R0902 Hypoxemia: Secondary | ICD-10-CM | POA: Insufficient documentation

## 2015-05-06 DIAGNOSIS — J9811 Atelectasis: Secondary | ICD-10-CM | POA: Diagnosis not present

## 2015-05-06 DIAGNOSIS — M25561 Pain in right knee: Secondary | ICD-10-CM | POA: Insufficient documentation

## 2015-05-06 DIAGNOSIS — R918 Other nonspecific abnormal finding of lung field: Secondary | ICD-10-CM | POA: Diagnosis not present

## 2015-05-06 DIAGNOSIS — S27309A Unspecified injury of lung, unspecified, initial encounter: Secondary | ICD-10-CM | POA: Diagnosis not present

## 2015-05-06 DIAGNOSIS — R000 Tachycardia, unspecified: Secondary | ICD-10-CM

## 2015-05-06 DIAGNOSIS — R1310 Dysphagia, unspecified: Secondary | ICD-10-CM | POA: Diagnosis not present

## 2015-05-06 DIAGNOSIS — J15212 Pneumonia due to Methicillin resistant Staphylococcus aureus: Secondary | ICD-10-CM | POA: Diagnosis not present

## 2015-05-06 DIAGNOSIS — J810 Acute pulmonary edema: Secondary | ICD-10-CM | POA: Diagnosis not present

## 2015-05-06 DIAGNOSIS — D72829 Elevated white blood cell count, unspecified: Secondary | ICD-10-CM

## 2015-05-06 DIAGNOSIS — E870 Hyperosmolality and hypernatremia: Secondary | ICD-10-CM | POA: Diagnosis not present

## 2015-05-06 DIAGNOSIS — Z79899 Other long term (current) drug therapy: Secondary | ICD-10-CM | POA: Diagnosis not present

## 2015-05-06 DIAGNOSIS — J189 Pneumonia, unspecified organism: Secondary | ICD-10-CM | POA: Diagnosis not present

## 2015-05-06 DIAGNOSIS — F909 Attention-deficit hyperactivity disorder, unspecified type: Secondary | ICD-10-CM

## 2015-05-06 DIAGNOSIS — R609 Edema, unspecified: Secondary | ICD-10-CM

## 2015-05-06 DIAGNOSIS — D573 Sickle-cell trait: Secondary | ICD-10-CM

## 2015-05-06 DIAGNOSIS — R5381 Other malaise: Principal | ICD-10-CM

## 2015-05-06 DIAGNOSIS — G9340 Encephalopathy, unspecified: Secondary | ICD-10-CM | POA: Diagnosis present

## 2015-05-06 DIAGNOSIS — J9600 Acute respiratory failure, unspecified whether with hypoxia or hypercapnia: Secondary | ICD-10-CM | POA: Diagnosis not present

## 2015-05-06 DIAGNOSIS — N179 Acute kidney failure, unspecified: Secondary | ICD-10-CM | POA: Diagnosis not present

## 2015-05-06 DIAGNOSIS — R05 Cough: Secondary | ICD-10-CM

## 2015-05-06 DIAGNOSIS — Z794 Long term (current) use of insulin: Secondary | ICD-10-CM

## 2015-05-06 DIAGNOSIS — I4891 Unspecified atrial fibrillation: Secondary | ICD-10-CM | POA: Diagnosis not present

## 2015-05-06 DIAGNOSIS — J9621 Acute and chronic respiratory failure with hypoxia: Secondary | ICD-10-CM | POA: Diagnosis not present

## 2015-05-06 DIAGNOSIS — E876 Hypokalemia: Secondary | ICD-10-CM

## 2015-05-06 DIAGNOSIS — R0602 Shortness of breath: Secondary | ICD-10-CM

## 2015-05-06 DIAGNOSIS — R059 Cough, unspecified: Secondary | ICD-10-CM | POA: Insufficient documentation

## 2015-05-06 DIAGNOSIS — I10 Essential (primary) hypertension: Secondary | ICD-10-CM | POA: Insufficient documentation

## 2015-05-06 DIAGNOSIS — I509 Heart failure, unspecified: Secondary | ICD-10-CM

## 2015-05-06 DIAGNOSIS — J9601 Acute respiratory failure with hypoxia: Secondary | ICD-10-CM | POA: Diagnosis not present

## 2015-05-06 MED ORDER — ACETAMINOPHEN 325 MG PO TABS
325.0000 mg | ORAL_TABLET | ORAL | Status: DC | PRN
  Administered 2015-05-10 – 2015-05-14 (×7): 650 mg via ORAL
  Filled 2015-05-06 (×7): qty 2

## 2015-05-06 MED ORDER — METOPROLOL TARTRATE 25 MG PO TABS
25.0000 mg | ORAL_TABLET | Freq: Two times a day (BID) | ORAL | Status: DC
  Administered 2015-05-06 – 2015-05-08 (×4): 25 mg via ORAL
  Filled 2015-05-06 (×4): qty 1

## 2015-05-06 MED ORDER — DILTIAZEM HCL 60 MG PO TABS
60.0000 mg | ORAL_TABLET | Freq: Three times a day (TID) | ORAL | Status: DC
  Administered 2015-05-06 – 2015-05-16 (×27): 60 mg via ORAL
  Filled 2015-05-06 (×28): qty 1

## 2015-05-06 MED ORDER — FAMOTIDINE 20 MG PO TABS
20.0000 mg | ORAL_TABLET | Freq: Two times a day (BID) | ORAL | Status: DC
  Administered 2015-05-06 – 2015-05-16 (×20): 20 mg via ORAL
  Filled 2015-05-06 (×20): qty 1

## 2015-05-06 MED ORDER — ONDANSETRON HCL 4 MG/2ML IJ SOLN: 4.0000 mg | Freq: Four times a day (QID) | INTRAMUSCULAR | Status: DC | PRN

## 2015-05-06 MED ORDER — MODAFINIL 100 MG PO TABS
100.0000 mg | ORAL_TABLET | Freq: Every day | ORAL | Status: DC
Start: 2015-05-06 — End: 2015-05-13
  Administered 2015-05-07 – 2015-05-13 (×7): 100 mg via ORAL
  Filled 2015-05-06 (×7): qty 1

## 2015-05-06 MED ORDER — VITAMIN D 1000 UNITS PO TABS
1000.0000 [IU] | ORAL_TABLET | Freq: Every day | ORAL | Status: DC
  Administered 2015-05-07 – 2015-05-16 (×10): 1000 [IU] via ORAL
  Filled 2015-05-06 (×10): qty 1

## 2015-05-06 MED ORDER — SORBITOL 70 % SOLN: 30.0000 mL | Freq: Every day | Status: DC | PRN

## 2015-05-06 MED ORDER — ONDANSETRON HCL 4 MG PO TABS: 4.0000 mg | ORAL_TABLET | Freq: Four times a day (QID) | ORAL | Status: DC | PRN

## 2015-05-06 MED ORDER — CLONAZEPAM 0.5 MG PO TABS
0.5000 mg | ORAL_TABLET | Freq: Every day | ORAL | Status: DC
  Administered 2015-05-06 – 2015-05-15 (×10): 0.5 mg via ORAL
  Filled 2015-05-06 (×10): qty 1

## 2015-05-06 NOTE — Progress Notes (Signed)
Cristina Gong, RN Rehab Admission Coordinator Signed Physical Medicine and Rehabilitation PMR Pre-admission 05/06/2015 12:30 PM  Related encounter: Documentation from 05/06/2015 in [place] [facility]     Secondary Market PMR Admission Coordinator Pre-Admission Assessment  Patient: Juan Cameron is an 40 y.o., male MRN: [id number] DOB: February 06, 1975 Height:   Weight:    Insurance Information HMO: PPO: PCP: IPA: 80/20: yes OTHER: no HMO PRIMARY: Medicare a and b Policy#: [id number] Subscriber: pt Benefits: Phone #: passport one Name: 05/06/2015 Eff. Date: 04/11/97 Deduct: $1316 Out of Pocket Max: none Life Max: none CIR: 100% SNF: 20 full days Outpatient: 80% Co-Pay: 20% Home Health: 100% Co-Pay: none DME: 80% Co-Pay: 20% Providers: pt choice  SECONDARY: Medicaid [place] Access Policy#: [id number] Subscriber: pt  Medicaid Application Date: Case Manager:  Disability Application Date: Case Worker:   Emergency Contact Information Contact Information    Name Relation Home Work Mobile   Hodes,[name] Sister   [phone]   [name]   [phone]   [name] Relative   [phone]      Current Medical History  Patient Admitting Diagnosis: Debilitation secondary to respiratory failure/ARDS/PNA?encephalopathy  History of Present Illness: 40 year old right handed African-American male with past medical history significant for autism with element of attention deficit hyperactivity disorder as well as sickle cell trait. Patient lives with his sister and attends adult daycare workshop daily. Independent prior to admission. Patient originally admitted to [facility] for multilobar pneumonia and discharged to home on 03/10/2015 and still using 4 L of oxygen. He was admitted back to the hospital 04/07/2015  with respiratory failure and ARDS. Patient did require intubation placed on broad-spectrum antibiotics. His hospital stay was complicated by acute kidney injury with dialysis initiated that he did receive a short time and creatinine has now returned back to normal levels at 1.11. Required tracheostomy 03/20/2015 and after failed extubation [facility]. Placed on Provigil for mood stabilization suspect encephalopathy with noted history of autism with good results. He was ultimately decannulated 04/25/2015. His diet was slowly advanced to a regular consistency bouts of tachycardia atrial fibrillation converted back to normal sinus rhythm maintained on Cardizem as well as Lopressor. Vancomycin-resistant enterococci urinary tract infection treated and remains on contact precautions. Patient still with generalized weakness therapies initiated. Patient was admitted for a comprehensive rehabilitation program.  Patient's medical record from [facility] has been reviewed by the rehabilitation admission coordinator and physician. I met with pt, his sister and Aunt at bedside for assessment.  NIH Stroke scale: Glascow Coma Scale:  Past Medical History  Past Medical History  Diagnosis Date  . Autism   . Sickle cell trait ([place])   . Pneumonia 02/2015  . Autism     Family History  family history includes Heart attack in his mother.  Prior Rehab/Hospitalizations Has the patient had major surgery during 100 days prior to admission? Yes   Current Medications Klonopin Diltiazem Lopressor Pepcid Docusate Fish Oil Provigil Pro-stat Vit D  Patients Current Diet: Renal Diet with thin Liquids  Precautions / Restrictions Fall Precautions  Has the patient had 2 or more falls or a fall with injury in the past year?No  Prior Activity Level Community (5-7x/wk): pt Independent without AD pta.  goes to a workshop MOnday thru Friday. Loves to [employer]. video games ,etc  Prior Functional Level Self Care: Did the patient need help bathing, dressing, using the toilet  or eating? Independent  Indoor Mobility: Did the patient need assistance with walking from room to room (with or without device)? Independent  Stairs: Did the patient need assistance with internal or external stairs (with or without device)? Independent  Functional Cognition: Did the patient need help planning regular tasks such as shopping or remembering to take medications? Needed some help  Home Assistive Devices / Equipment    Prior Device Use: Indicate devices/aids used by the patient prior to current illness, exacerbation or injury? None of the above   Prior Functional Level Current Functional Level  Bed Mobility  Independent  Independent   Transfers  Independent  Mod assist   Mobility - Walk/Wheelchair  Independent   (no ambulation yet/ Mod assist in parallel bars last week but)   Upper Body Dressing  Independent  Min assist   Lower Body Dressing  Independent  Max assist   Grooming  Independent  Independent   Eating/Drinking  Independent  Independent   Toilet Transfer  Independent  Mod assist   Bladder Continence   continent  uses urinal   Bowel Management  continent  continent   Stair Climbing      (not attempted)   Communication  two or three word sentences at baseline, Soft voice since trach decannulation  uses two to three word sentences at baseline and points   Memory  same as baseline  at baseline per family   Cooking/Meal Prep  dependent on others to provide     Housework  dependent on others    Money Management  depndent on others/[name]/sister/guardian    Driving         Special needs/care consideration Skin dry skin noted Bowel mgmt: continent Bladder mgmt: continent   Previous Home  Environment Living Arrangements: Other (Comment) (Lives with his sister/[name] who is his guardian) Lives With: (sister, Stage manager) Available Help at Discharge: Family, Available 24 hours/day Type of Home: House Home Layout: One level (one level condo) Home Access: Stairs to enter Entrance Stairs-Rails: None Entrance Stairs-Number of Steps: 2-3 steps Bathroom Shower/Tub: Tub/shower unit, Architectural technologist: Standard Bathroom Accessibility: Yes How Accessible: Accessible via walker Home Care Services: No Pt lives with his sister, [name]. At 630 am every Monday through Friday she drops him off at his Aunt [name]'s home where he stays until Lucianne Lei picks him up around 730 to go to a workshop until 330 . The Lucianne Lei then takes him back to Aunt [name]'s home where he stays until [place] picks him up. [name] works days at a [employer] and then goes to school daily. Pt loves to bowl, do outside activities with his workshop group, etc. Family reports pt is developmentally as a 69 or 40 year old. Pleasant and very social.  Discharge Living Setting Plans for Discharge Living Setting: Patient's home, Lives with (comment) (home with sister, [name]/guardian) Type of Home at Discharge: House Discharge Home Layout: One level (one level condo) Discharge Home Access: Stairs to enter Entrance Stairs-Rails: None Entrance Stairs-Number of Steps: 2-3 Discharge Bathroom Shower/Tub: Tub/shower unit, Curtain Discharge Bathroom Toilet: Standard Discharge Bathroom Accessibility: Yes How Accessible: Accessible via walker Does the patient have any problems obtaining your medications?: No  Social/Family/Support Systems Patient Roles: (goes to [facility] for mentally handicapped monday throu) Contact Information: [name] and Aunt [name] Anticipated Caregiver: [name] and Aunt [name] Anticipated Caregiver's Contact Information: see above Ability/Limitations of Caregiver: [name] works days and attends  school. Aunt [name] would be second caregiver Caregiver Availability: 24/7 Discharge Plan  Discussed with Primary Caregiver: Yes Is Caregiver In Agreement with Plan?: Yes Does Caregiver/Family have Issues with Lodging/Transportation while Pt is in Rehab?: No  Goals/Additional Needs Patient/Family Goal for Rehab: supervision PT, supervision to min assist OT Expected length of stay: ELOS 10-14 days Dietary Needs: Renal diet with thin liquids Additional Information: Pt autistic and mentally handicapped. Devleopment age of 60 or 86 year old per Aunt [name] Pt/Family Agrees to Admission and willing to participate: Yes Program Orientation Provided & Reviewed with Pt/Caregiver Including Roles & Responsibilities: Yes  Patient Condition: I have reviewed all medical records from [facility] and [facility], spoke with Case Manager and met with pt, HIs sister, [name] and Aunt [name] at bedside. Patient needs ongloing PT and OT and will beneift form the coordinated team approach offered in an inpatient acute rehabilitation facility. He will receive 3 hours per day of therapy a day with Rehabilitation Physcian and Nursing interventions. He is currently overall mod assist, no ambulation yet. We will admit patient to inpateitn acute rehabilitation today.  Preadmission Screen Completed By: Cleatrice Burke, 05/06/2015 1:16 PM ______________________________________________________________________  Discussed status with Dr. Naaman Plummer on 05/06/2015 at 1314 and received telephone approval for admission today.  Admission Coordinator: Cleatrice Burke, time 1314 Date 05/06/2015.   Assessment/Plan: Diagnosis: debility after respiratory failure 1. Does the need for close, 24 hr/day Medical supervision in concert with the patient's rehab needs make it unreasonable for this patient to be served in a less intensive setting? Yes 2. Co-Morbidities requiring supervision/potential complications:  down's syndrome, hx of trach, VRE, afib 3. Due to bladder management, bowel management, safety, skin/wound care, disease management, medication administration, pain management and patient education, does the patient require 24 hr/day rehab nursing? Yes 4. Does the patient require coordinated care of a physician, rehab nurse, PT (1-2 hrs/day, 5 days/week) and OT (1-2 hrs/day, 5 days/week) to address physical and functional deficits in the context of the above medical diagnosis(es)? Yes Addressing deficits in the following areas: balance, endurance, locomotion, strength, transferring, bowel/bladder control, bathing, dressing, feeding, grooming and toileting 5. Can the patient actively participate in an intensive therapy program of at least 3 hrs of therapy 5 days a week? Yes 6. The potential for patient to make measurable gains while on inpatient rehab is excellent 7. Anticipated functional outcomes upon discharge from inpatients are: supervision PT, supervision and min assist OT, n/a SLP 8. Estimated rehab length of stay to reach the above functional goals is: 10-14 days 9. Does the patient have adequate social supports to accommodate these discharge functional goals? Yes 10. Anticipated D/C setting: Home 11. Anticipated post D/C treatments: HH therapy and Outpatient therapy 12. Overall Rehab/Functional Prognosis: excellent    RECOMMENDATIONS: This patient's condition is appropriate for continued rehabilitative care in the following setting: CIR Patient has agreed to participate in recommended program. Yes Note that insurance prior authorization may be required for reimbursement for recommended care.  Comment:Admit to inpatient rehab today Meredith Staggers, MD, [place] Physical Medicine & Rehabilitation 05/06/2015   Cleatrice Burke 05/06/2015      Cosigned by: Meredith Staggers, MD at 05/06/2015 2:28 PM  Revision History     Date/Time User Provider Type Action    05/06/2015 2:28 PM Meredith Staggers, MD Physician Cosign   05/06/2015 1:30 PM Meredith Staggers, MD Physician Sign   05/06/2015 1:18 PM Cristina Gong, RN Rehab Admission Coordinator Share   View Details Report

## 2015-05-06 NOTE — H&P (Signed)
  Physical Medicine and Rehabilitation Admission H&P     HPI: 40-year-old right handed African-American male with past medical history significant for autism with element of attention deficit hyperactivity disorder as well as sickle cell trait. Patient lives with his sister and attends adult daycare workshop daily. Independent prior to admission. Patient originally admitted to [facility] for multilobar pneumonia and discharged to home on 03/10/2015 and still using 4 L of oxygen. He was admitted back to the hospital 04/07/2015 with respiratory failure and ARDS. Patient did require intubation placed on broad-spectrum antibiotics. His hospital stay was complicated by acute kidney injury with dialysis initiated that he did receive a short time and creatinine has now returned back to normal levels at 1.11. Required tracheostomy 03/20/2015 and after failed extubation [facility]. Placed on Provigil for mood stabilization suspect encephalopathy with noted history of autism with good results. He was ultimately decannulated 04/25/2015. His diet was slowly advanced to a regular consistency bouts of tachycardia atrial fibrillation converted back to normal sinus rhythm maintained on Cardizem as well as Lopressor. Vancomycin-resistant enterococci urinary tract infection treated and remains on contact precautions. Patient still with generalized weakness therapies initiated. Patient was admitted for a comprehensive rehabilitation program  Review of Systems  Constitutional: Negative for fever and chills.  HENT: Negative for hearing loss.   Eyes: Negative for blurred vision and double vision.  Respiratory: Positive for cough and shortness of breath.   Cardiovascular: Positive for leg swelling. Negative for chest pain and palpitations.  Gastrointestinal: Positive for constipation. Negative for nausea and vomiting.  Genitourinary: Negative for  dysuria and hematuria.  Skin: Negative for rash.  Neurological: Positive for weakness. Negative for seizures, loss of consciousness and headaches.  Psychiatric/Behavioral:       Severe autism with element of attention deficit hyperactivity disorder  All other systems reviewed and are negative.  Past Medical History  Diagnosis Date  . Autism   . Sickle cell trait ([facility])   . Pneumonia 02/2015  . Autism    Past Surgical History  Procedure Laterality Date  . No past surgeries     Family History  Problem Relation Age of Onset  . Heart attack Mother    Social History:  reports that he has never smoked. He has never used smokeless tobacco. He reports that he does not drink alcohol or use illicit drugs. Allergies:  Allergies  Allergen Reactions  . Peanuts [Peanut Oil] Anaphylaxis   Medications Prior to Admission  Medication Sig Dispense Refill  . acetaminophen (TYLENOL) 160 MG/5ML solution Place 20.3 mLs (650 mg total) into feeding tube every 4 (four) hours as needed for mild pain, headache or fever. 120 mL 0  . albuterol (PROVENTIL) (2.5 MG/3ML) 0.083% nebulizer solution Take 2.5 mg by nebulization every 6 (six) hours as needed for wheezing or shortness of breath.    . Amino Acids-Protein Hydrolys (FEEDING SUPPLEMENT, PRO-STAT SUGAR FREE 64,) LIQD Place 30 mLs into feeding tube 2 (two) times daily. 900 mL 0  . chlorhexidine gluconate, SAGE KIT, (PERIDEX) 0.12 % solution 15 mLs by Mouth Rinse route 2 (two) times daily. 120 mL 0  . clonazePAM (KLONOPIN) 1 MG tablet Take 1 tablet (1 mg total) by mouth 2 (two) times daily. 30 tablet 0  . docusate (COLACE) 50 MG/5ML liquid Place 10 mLs (100 mg total) into feeding tube daily. 100 mL 0  . fentaNYL (SUBLIMAZE) 100 MCG/2ML injection Inject 0.5-1 mLs (25-50 mcg   total) into the vein every 2 (two) hours as needed for severe pain. 2 mL 0  . heparin 5000 UNIT/ML injection Inject 1 mL (5,000 Units total) into the skin every 8 (eight) hours. 1 mL   .  insulin aspart (NOVOLOG) 100 UNIT/ML injection Inject 2-6 Units into the skin every 4 (four) hours. 10 mL 11  . ipratropium-albuterol (DUONEB) 0.5-2.5 (3) MG/3ML SOLN Take 3 mLs by nebulization 3 (three) times daily. 360 mL 3  . meropenem 500 mg in sodium chloride 0.9 % 50 mL Inject 500 mg into the vein daily.    . Nutritional Supplements (FEEDING SUPPLEMENT, VITAL HIGH PROTEIN,) LIQD liquid Place 1,000 mLs into feeding tube daily.    . pantoprazole sodium (PROTONIX) 40 mg/20 mL PACK Place 20 mLs (40 mg total) into feeding tube daily at 12 noon. 30 each   . polyethylene glycol (MIRALAX / GLYCOLAX) packet Take 17 g by mouth daily as needed for moderate constipation. 14 each 0    Home:  lives with sister. Independent prior to initial admission to the hospital. Attends adult workshop. One level home. His aunt will also assist as needed   Functional History:  independent prior to admission  Functional Status:  Mobility:    requires mod max assist for transfers. Needing some encouragement at times to participate.      ADL: Min mod assist    Cognition: Sister feels patient is at baseline      Physical Exam: Blood pressure 143/87, pulse 100, resp. rate 18, SpO2 100 %. Physical Exam  HENT:  Head: Normocephalic.  Eyes: Conjunctivae and EOM are normal. Pupils are equal, round, and reactive to light.  Neck: Normal range of motion. Neck supple. No tracheal deviation present. No thyromegaly present.  Cardiovascular: Normal rate and regular rhythm.   Respiratory: Effort normal. No respiratory distress.  GI: Soft. Bowel sounds are normal. He exhibits no distension.  Musculoskeletal:  Knees subjectively tender to palpation but now swelling or warmth.   Neurological: He is alert.  Patient will make limited eye contact with examiner. He will use simple 2 and 3 word phrases. Difficult to engage. He can provide his name but will not provide his age. Did tell me that he lives in [place].   Follows simple demonstrated commands. UE strength grossly 4/5 bilaterally. Hesitated to move either leg due to bilateral knee pain. Sensation appears to be intact in all 4.   Skin:  Trach site healing nicely. Scaly/dry skin on the soles of either foot.   Psychiatric:  Very quiet. Hesitant to engage.       Medical Problem List and Plan: 1.  Debilitation secondary to respiratory failure/ARDS/multilobar pneumonia /encephalopathy  -admit to inpatient rehab 2.  DVT Prophylaxis/Anticoagulation: SCDs/ambulate. Monitor for any signs of DVT 3. Pain Management: Tylenol 4. Mood/autism/mentally impaired: Klonopin 0.5 mg daily at bedtime, Provigil 100 mg daily.  5. Neuropsych: This patient is not capable of making decisions on his own behalf. 6. Skin/Wound Care: Routine skin checks 7. Fluids/Electrolytes/Nutrition: Routine eye and nose with follow-up chemistries 8. Ventilatory dependent respiratory failure/tracheostomy. Decannulation 04/25/2015 9. Acute kidney failure. Resolved. Patient did receive dialysis for a short time. Cr normalized.  Follow-up chemistries 10. Vancomycin resistant enterococci urinary tract infection. Treated. Continue contact precautions 11. Tachycardia/atrial fibrillation. Cardiac rate control. Continue Cardizem 60 mg every 8 hours, Lopressor 25 mg twice a day 12. History of sickle cell trait    Post Admission Physician Evaluation: 1. Functional deficits secondary  to debility. 2.  Patient is admitted to receive collaborative, interdisciplinary care between the physiatrist, rehab nursing staff, and therapy team. 3. Patient's level of medical complexity and substantial therapy needs in context of that medical necessity cannot be provided at a lesser intensity of care such as a SNF. 4. Patient has experienced substantial functional loss from his/her baseline which was documented above under the "Functional History" and "Functional Status" headings.  Judging by the patient's  diagnosis, physical exam, and functional history, the patient has potential for functional progress which will result in measurable gains while on inpatient rehab.  These gains will be of substantial and practical use upon discharge  in facilitating mobility and self-care at the household level. 5. Physiatrist will provide 24 hour management of medical needs as well as oversight of the therapy plan/treatment and provide guidance as appropriate regarding the interaction of the two. 6. 24 hour rehab nursing will assist with bladder management, bowel management, safety, skin/wound care, disease management, medication administration, pain management and patient education  and help integrate therapy concepts, techniques,education, etc. 7. PT will assess and treat for/with: Lower extremity strength, range of motion, stamina, balance, functional mobility, safety, adaptive techniques and equipment, NMR, education, community reintegration, behavior.   Goals are: supervision. 8. OT will assess and treat for/with: ADL's, functional mobility, safety, upper extremity strength, adaptive techniques and equipment, NMR, community reintegration, behavior, family ed.   Goals are: supervision to min assist. Therapy may proceed with showering this patient. 9. SLP will assess and treat for/with: n/a.  Goals are: n/a. 10. Case Management and Social Worker will assess and treat for psychological issues and discharge planning. 11. Team conference will be held weekly to assess progress toward goals and to determine barriers to discharge. 12. Patient will receive at least 3 hours of therapy per day at least 5 days per week. 13. ELOS: 10-14 days       14. Prognosis:  excellent     Meredith Staggers, MD, [place] Physical Medicine & Rehabilitation 05/06/2015   05/06/2015

## 2015-05-06 NOTE — PMR Pre-admission (Signed)
[facility] PMR Admission Coordinator Pre-Admission Assessment  Patient: Juan Cameron is an 40 y.o., male MRN: [id number] DOB: 05/19/1975 Height:   Weight:    Insurance Information HMO:     PPO:      PCP:      IPA:      80/20: yes     OTHER: no HMO PRIMARY: Medicare a and b      Policy#: [id number]      Subscriber: pt Benefits:  Phone #: passport one    Name: 05/06/2015 Eff. Date: 04/11/97     Deduct: $1316      Out of Pocket Max: none      Life Max: none CIR: 100%      SNF: 20 full days Outpatient: 80%     Co-Pay: 20% Home Health: 100%      Co-Pay: none DME: 80%     Co-Pay: 20% Providers: pt choice  SECONDARY: Medicaid [place] Access      Policy#: [id number]      Subscriber: pt  Medicaid Application Date:       Case Manager:  Disability Application Date:       Case Worker:   Emergency Contact Information Contact Information    Name Relation Home Work Mobile   Juan Cameron Sister   [phone]   Juan Cameron   [phone]   Juan Cameron Relative   [phone]      Current Medical History  Patient Admitting Diagnosis: Debilitation secondary to respiratory failure/ARDS/PNA?encephalopathy  History of Present Illness: 40 year old right handed African-American male with past medical history significant for autism with element of attention deficit hyperactivity disorder as well as sickle cell trait. Patient lives with his sister and attends adult daycare workshop daily. Independent prior to admission. Patient originally admitted to [facility] for multilobar pneumonia and discharged to home on 03/10/2015 and still using 4 L of oxygen. He was admitted back to the hospital 04/07/2015 with respiratory failure and ARDS. Patient did require intubation placed on broad-spectrum antibiotics. His hospital stay was complicated by acute kidney injury with dialysis initiated that he did receive a short time and creatinine has now returned back to normal levels at 1.11.  Required tracheostomy 03/20/2015 and after failed extubation [facility]. Placed on Provigil for mood stabilization suspect encephalopathy with noted history of autism with good results. He was ultimately decannulated 04/25/2015. His diet was slowly advanced to a regular consistency bouts of tachycardia atrial fibrillation converted back to normal sinus rhythm maintained on Cardizem as well as Lopressor. Vancomycin-resistant enterococci urinary tract infection treated and remains on contact precautions. Patient still with generalized weakness therapies initiated. Patient was admitted for a comprehensive rehabilitation program.  Patient[facility] medical [facility] from [facility] has been reviewed by the rehabilitation admission coordinator and physician. I met with pt, his sister and Aunt at bedside for assessment.  NIH Stroke scale: Glascow Coma Scale:  Past Medical History  Past Medical History  Diagnosis Date  . Autism   . Sickle cell trait ([place])   . Pneumonia 02/2015  . Autism     Family History   family history includes Heart attack in his mother.  Prior Rehab/Hospitalizations Has the patient had major surgery during 100 days prior to admission? Yes    Current Medications Klonopin Diltiazem Lopressor Pepcid Docusate Fish Oil Provigil Pro-stat Vit D  Patients Current Diet:  Renal Diet with thin Liquids  Precautions / Restrictions Fall Precautions  Has the  patient had 2 or more falls or a fall with injury in the past year?No  Prior Activity Level Community (5-7x/wk): pt Independent without AD pta. goes to a workshop MOnday thru Friday. Loves to [employer]. video games ,etc  Prior Functional Level Self Care: Did the patient need help bathing, dressing, using the toilet or eating?  Independent  Indoor Mobility: Did the patient need assistance with walking from room to room (with or without device)?  Independent  Stairs: Did the patient need assistance with internal or external stairs (with or without device)? Independent  Functional Cognition: Did the patient need help planning regular tasks such as shopping or remembering to take medications? Needed some help  Home Assistive Devices / Equipment    Prior Device Use: Indicate devices/aids used by the patient prior to current illness, exacerbation or injury? None of the above   Prior Functional Level Current Functional Level  Bed Mobility  Independent  Independent   Transfers  Independent  Mod assist   Mobility - Walk/Wheelchair  Independent   (no ambulation yet/ Mod assist in parallel bars last week but)   Upper Body Dressing  Independent  Min assist   Lower Body Dressing  Independent  Max assist   Grooming  Independent  Independent   Eating/Drinking  Independent  Independent   Toilet Transfer  Independent  Mod assist   Bladder Continence   continent  uses urinal   Bowel Management  continent  continent   Stair Climbing      (not attempted)   Communication  two or three word sentences at baseline, Soft voice since trach decannulation  uses two to three word sentences at baseline and points   Memory  same as baseline  at baseline per family   Cooking/Meal Prep  dependent on others to provide      Housework  dependent on others    Money Management  depndent on others/[name]/sister/guardian    Driving         Special needs/care consideration Skin dry skin noted Bowel mgmt: continent Bladder mgmt: continent   Previous Home Environment Living Arrangements: Other (Comment) (Lives with his sister/[name] who is his guardian)  Lives With:  (sister, Stage manager) Available Help at Discharge: Family, Available 24 hours/day Type of Home: House Home Layout: One level (one level condo) Home Access: Stairs to enter Entrance Stairs-Rails: None Entrance Stairs-Number of Steps: 2-3  steps Bathroom Shower/Tub: Tub/shower unit, Architectural technologist: Standard Bathroom Accessibility: Yes How Accessible: Accessible via walker Home Care Services: No Pt lives with his sister, Juan Cameron. At 630 am every Monday through Friday she drops him off at his Aunt [name]'s home where he stays until Lucianne Lei picks him up around 730 to go to a workshop until 330 . The Lucianne Lei then takes him back to Aunt [name]'s home where he stays until [place] picks him up. Juan Cameron works days at a [employer] and then goes to school daily. Pt loves to bowl, do outside activities with his workshop group, etc. Family reports pt is developmentally as a 35 or 40 year old. Pleasant and very social.  Discharge Living Setting Plans for Discharge Living Setting: Patient's home, Lives with (comment) (home with sister, [name]/guardian) Type of Home at Discharge: House Discharge Home Layout: One level (one level condo) Discharge Home Access: Stairs to enter Entrance Stairs-Rails: None Entrance Stairs-Number of Steps: 2-3 Discharge Bathroom Shower/Tub: Tub/shower unit, Curtain Discharge Bathroom Toilet: Standard Discharge Bathroom Accessibility: Yes How Accessible: Accessible via walker Does the patient  have any problems obtaining your medications?: No  Social/Family/Support Systems Patient Roles:  (goes to [facility] for mentally handicapped monday throu) Contact Information: Juan Cameron and Aunt [name] Anticipated Caregiver: Juan Cameron and Aunt [name] Anticipated Caregiver's Contact Information: see above Ability/Limitations of Caregiver: Juan Cameron works days and attends school. Aunt [name] would be second caregiver Caregiver Availability: 24/7 Discharge Plan Discussed with Primary Caregiver: Yes Is Caregiver In Agreement with Plan?: Yes Does Caregiver/Family have Issues with Lodging/Transportation while Pt is in Rehab?: No  Goals/Additional Needs Patient/Family Goal for Rehab: supervision PT, supervision to min assist  OT Expected length of stay: ELOS 10-14 days Dietary Needs: Renal diet with thin liquids Additional Information: Pt autistic and mentally handicapped. Devleopment age of 9 or 41 year old per Aunt [name] Pt/Family Agrees to Admission and willing to participate: Yes Program Orientation Provided & Reviewed with Pt/Caregiver Including Roles  & Responsibilities: Yes  Patient Condition: I have reviewed all medical records from [facility], spoke with Case Manager and met with pt, HIs sister, Juan Cameron and Aunt [name] at bedside. Patient needs ongloing PT and OT and will beneift form the coordinated team approach offered in an inpatient acute rehabilitation facility. He will receive 3 hours per day of therapy a day with Rehabilitation Physcian and Nursing interventions. He is currently overall mod assist, no ambulation yet. We will admit patient to inpateitn acute rehabilitation today.  Preadmission Screen Completed By:  Cleatrice Burke, 05/06/2015 1:16 PM ______________________________________________________________________   Discussed status with Dr. Naaman Plummer  on  05/06/2015  at  1314 and received telephone approval for admission today.  Admission Coordinator:  Cleatrice Burke, time  1314 Date  05/06/2015.   Assessment/Plan: Diagnosis: debility after respiratory failure 1. Does the need for close, 24 hr/day  Medical supervision in concert with the patient's rehab needs make it unreasonable for this patient to be served in a less intensive setting? Yes 2. Co-Morbidities requiring supervision/potential complications: down's syndrome, hx of trach, VRE, afib 3. Due to bladder management, bowel management, safety, skin/wound care, disease management, medication administration, pain management and patient education, does the patient require 24 hr/day rehab nursing? Yes 4. Does the patient require coordinated care of a physician, rehab nurse, PT (1-2 hrs/day, 5 days/week)  and OT (1-2 hrs/day, 5 days/week) to address physical and functional deficits in the context of the above medical diagnosis(es)? Yes Addressing deficits in the following areas: balance, endurance, locomotion, strength, transferring, bowel/bladder control, bathing, dressing, feeding, grooming and toileting 5. Can the patient actively participate in an intensive therapy program of at least 3 hrs of therapy 5 days a week? Yes 6. The potential for patient to make measurable gains while on inpatient rehab is excellent 7. Anticipated functional outcomes upon discharge from inpatients are: supervision PT, supervision and min assist OT, n/a SLP 8. Estimated rehab length of stay to reach the above functional goals is: 10-14 days 9. Does the patient have adequate social supports to accommodate these discharge functional goals? Yes 10. Anticipated D/C setting: Home 11. Anticipated post D/C treatments: HH therapy and Outpatient therapy 12. Overall Rehab/Functional Prognosis: excellent    RECOMMENDATIONS: This patient's condition is appropriate for continued rehabilitative care in the following setting: CIR Patient has agreed to participate in recommended program. Yes Note that insurance prior authorization may be required for reimbursement for recommended care.  Comment:Admit to inpatient rehab today Meredith Staggers, MD, [facility] 05/06/2015   Cleatrice Burke 05/06/2015

## 2015-05-06 NOTE — Interval H&P Note (Signed)
Juan RipperRobert Cameron was admitted today to Inpatient Rehabilitation with the diagnosis of debility.  The patient's history has been reviewed, patient examined, and there is no change in status.  Patient continues to be appropriate for intensive inpatient rehabilitation.  I have reviewed the patient's chart and labs.  Questions were answered to the patient's satisfaction. The PAPE has been reviewed and assessment remains appropriate.  Juan Cameron T 05/06/2015, 5:40 PM

## 2015-05-06 NOTE — H&P (View-Only) (Signed)
Physical Medicine and Rehabilitation Admission H&P     HPI: 40 year old right handed African-American male with past medical history significant for autism with element of attention deficit hyperactivity disorder as well as sickle cell trait. Patient lives with his sister and attends adult daycare workshop daily. Independent prior to admission. Patient originally admitted to [facility] for multilobar pneumonia and discharged to home on 03/10/2015 and still using 4 L of oxygen. He was admitted back to the hospital 04/07/2015 with respiratory failure and ARDS. Patient did require intubation placed on broad-spectrum antibiotics. His hospital stay was complicated by acute kidney injury with dialysis initiated that he did receive a short time and creatinine has now returned back to normal levels at 1.11. Required tracheostomy 03/20/2015 and after failed extubation [facility]. Placed on Provigil for mood stabilization suspect encephalopathy with noted history of autism with good results. He was ultimately decannulated 04/25/2015. His diet was slowly advanced to a regular consistency bouts of tachycardia atrial fibrillation converted back to normal sinus rhythm maintained on Cardizem as well as Lopressor. Vancomycin-resistant enterococci urinary tract infection treated and remains on contact precautions. Patient still with generalized weakness therapies initiated. Patient was admitted for a comprehensive rehabilitation program  Review of Systems  Constitutional: Negative for fever and chills.  HENT: Negative for hearing loss.   Eyes: Negative for blurred vision and double vision.  Respiratory: Positive for cough and shortness of breath.   Cardiovascular: Positive for leg swelling. Negative for chest pain and palpitations.  Gastrointestinal: Positive for constipation. Negative for nausea and vomiting.  Genitourinary: Negative for  dysuria and hematuria.  Skin: Negative for rash.  Neurological: Positive for weakness. Negative for seizures, loss of consciousness and headaches.  Psychiatric/Behavioral:       Severe autism with element of attention deficit hyperactivity disorder  All other systems reviewed and are negative.  Past Medical History  Diagnosis Date  . Autism   . Sickle cell trait ([place])   . Pneumonia 02/2015  . Autism    Past Surgical History  Procedure Laterality Date  . No past surgeries     Family History  Problem Relation Age of Onset  . Heart attack Mother    Social History:  reports that he has never smoked. He has never used smokeless tobacco. He reports that he does not drink alcohol or use illicit drugs. Allergies:  Allergies  Allergen Reactions  . Peanuts [Peanut Oil] Anaphylaxis   Medications Prior to Admission  Medication Sig Dispense Refill  . acetaminophen (TYLENOL) 160 MG/5ML solution Place 20.3 mLs (650 mg total) into feeding tube every 4 (four) hours as needed for mild pain, headache or fever. 120 mL 0  . albuterol (PROVENTIL) (2.5 MG/3ML) 0.083% nebulizer solution Take 2.5 mg by nebulization every 6 (six) hours as needed for wheezing or shortness of breath.    . Amino Acids-Protein Hydrolys (FEEDING SUPPLEMENT, PRO-STAT SUGAR FREE 64,) LIQD Place 30 mLs into feeding tube 2 (two) times daily. 900 mL 0  . chlorhexidine gluconate, SAGE KIT, (PERIDEX) 0.12 % solution 15 mLs by Mouth Rinse route 2 (two) times daily. 120 mL 0  . clonazePAM (KLONOPIN) 1 MG tablet Take 1 tablet (1 mg total) by mouth 2 (two) times daily. 30 tablet 0  . docusate (COLACE) 50 MG/5ML liquid Place 10 mLs (100 mg total) into feeding tube daily. 100 mL 0  . fentaNYL (SUBLIMAZE) 100 MCG/2ML injection Inject 0.5-1 mLs (25-50 mcg  total) into the vein every 2 (two) hours as needed for severe pain. 2 mL 0  . heparin 5000 UNIT/ML injection Inject 1 mL (5,000 Units total) into the skin every 8 (eight) hours. 1 mL   .  insulin aspart (NOVOLOG) 100 UNIT/ML injection Inject 2-6 Units into the skin every 4 (four) hours. 10 mL 11  . ipratropium-albuterol (DUONEB) 0.5-2.5 (3) MG/3ML SOLN Take 3 mLs by nebulization 3 (three) times daily. 360 mL 3  . meropenem 500 mg in sodium chloride 0.9 % 50 mL Inject 500 mg into the vein daily.    . Nutritional Supplements (FEEDING SUPPLEMENT, VITAL HIGH PROTEIN,) LIQD liquid Place 1,000 mLs into feeding tube daily.    . pantoprazole sodium (PROTONIX) 40 mg/20 mL PACK Place 20 mLs (40 mg total) into feeding tube daily at 12 noon. 30 each   . polyethylene glycol (MIRALAX / GLYCOLAX) packet Take 17 g by mouth daily as needed for moderate constipation. 14 each 0    Home:  lives with sister. Independent prior to initial admission to the hospital. Attends adult workshop. One level home. His aunt will also assist as needed   Functional History:  independent prior to admission  Functional Status:  Mobility:    requires mod max assist for transfers. Needing some encouragement at times to participate.      ADL: Min mod assist    Cognition: Sister feels patient is at baseline      Physical Exam: Blood pressure 143/87, pulse 100, resp. rate 18, SpO2 100 %. Physical Exam  HENT:  Head: Normocephalic.  Eyes: Conjunctivae and EOM are normal. Pupils are equal, round, and reactive to light.  Neck: Normal range of motion. Neck supple. No tracheal deviation present. No thyromegaly present.  Cardiovascular: Normal rate and regular rhythm.   Respiratory: Effort normal. No respiratory distress.  GI: Soft. Bowel sounds are normal. He exhibits no distension.  Musculoskeletal:  Knees subjectively tender to palpation but now swelling or warmth.   Neurological: He is alert.  Patient will make limited eye contact with examiner. He will use simple 2 and 3 word phrases. Difficult to engage. He can provide his name but will not provide his age. Did tell me that he lives in [place].   Follows simple demonstrated commands. UE strength grossly 4/5 bilaterally. Hesitated to move either leg due to bilateral knee pain. Sensation appears to be intact in all 4.   Skin:  Trach site healing nicely. Scaly/dry skin on the soles of either foot.   Psychiatric:  Very quiet. Hesitant to engage.       Medical Problem List and Plan: 1.  Debilitation secondary to respiratory failure/ARDS/multilobar pneumonia /encephalopathy  -admit to inpatient rehab 2.  DVT Prophylaxis/Anticoagulation: SCDs/ambulate. Monitor for any signs of DVT 3. Pain Management: Tylenol 4. Mood/autism/mentally impaired: Klonopin 0.5 mg daily at bedtime, Provigil 100 mg daily.  5. Neuropsych: This patient is not capable of making decisions on his own behalf. 6. Skin/Wound Care: Routine skin checks 7. Fluids/Electrolytes/Nutrition: Routine eye and nose with follow-up chemistries 8. Ventilatory dependent respiratory failure/tracheostomy. Decannulation 04/25/2015 9. Acute kidney failure. Resolved. Patient did receive dialysis for a short time. Cr normalized.  Follow-up chemistries 10. Vancomycin resistant enterococci urinary tract infection. Treated. Continue contact precautions 11. Tachycardia/atrial fibrillation. Cardiac rate control. Continue Cardizem 60 mg every 8 hours, Lopressor 25 mg twice a day 12. History of sickle cell trait    Post Admission Physician Evaluation: 1. Functional deficits secondary  to debility. 2.  Patient is admitted to receive collaborative, interdisciplinary care between the physiatrist, rehab nursing staff, and therapy team. 3. Patient's level of medical complexity and substantial therapy needs in context of that medical necessity cannot be provided at a lesser intensity of care such as a SNF. 4. Patient has experienced substantial functional loss from his/her baseline which was documented above under the "Functional History" and "Functional Status" headings.  Judging by the patient's  diagnosis, physical exam, and functional history, the patient has potential for functional progress which will result in measurable gains while on inpatient rehab.  These gains will be of substantial and practical use upon discharge  in facilitating mobility and self-care at the household level. 5. Physiatrist will provide 24 hour management of medical needs as well as oversight of the therapy plan/treatment and provide guidance as appropriate regarding the interaction of the two. 6. 24 hour rehab nursing will assist with bladder management, bowel management, safety, skin/wound care, disease management, medication administration, pain management and patient education  and help integrate therapy concepts, techniques,education, etc. 7. PT will assess and treat for/with: Lower extremity strength, range of motion, stamina, balance, functional mobility, safety, adaptive techniques and equipment, NMR, education, community reintegration, behavior.   Goals are: supervision. 8. OT will assess and treat for/with: ADL's, functional mobility, safety, upper extremity strength, adaptive techniques and equipment, NMR, community reintegration, behavior, family ed.   Goals are: supervision to min assist. Therapy may proceed with showering this patient. 9. SLP will assess and treat for/with: n/a.  Goals are: n/a. 10. Case Management and Social Worker will assess and treat for psychological issues and discharge planning. 11. Team conference will be held weekly to assess progress toward goals and to determine barriers to discharge. 12. Patient will receive at least 3 hours of therapy per day at least 5 days per week. 13. ELOS: 10-14 days       14. Prognosis:  excellent     Meredith Staggers, MD, [place] [facility] 05/06/2015   05/06/2015

## 2015-05-06 NOTE — Progress Notes (Signed)
Patient ID: Juan Cameron, male   DOB: 1975-05-22, 40 y.o.   MRN: [id number] Patient admitted to [phone] via wheelchair, escorted by nursing staff and sister.  Patients sister verbalized understanding of rehab process since patients cognitive function limited (preadmission baseline).  Appears to be in no immediate distress at this time.  Will continue to monitor.  Dani Gobbleeardon, Natividad Schlosser J, RN

## 2015-05-07 ENCOUNTER — Inpatient Hospital Stay (HOSPITAL_COMMUNITY): Payer: Medicare Other | Admitting: Physical Therapy

## 2015-05-07 ENCOUNTER — Inpatient Hospital Stay (HOSPITAL_COMMUNITY): Payer: Medicare Other | Admitting: Occupational Therapy

## 2015-05-07 DIAGNOSIS — D72829 Elevated white blood cell count, unspecified: Secondary | ICD-10-CM

## 2015-05-07 DIAGNOSIS — R000 Tachycardia, unspecified: Secondary | ICD-10-CM | POA: Insufficient documentation

## 2015-05-07 NOTE — Care Management Note (Signed)
Inpatient Rehabilitation Center Individual Statement of Services  Patient Name:  Juan RipperRobert Devinney  Date:  05/07/2015  Welcome to the Inpatient Rehabilitation Center.  Our goal is to provide you with an individualized program based on your diagnosis and situation, designed to meet your specific needs.  With this comprehensive rehabilitation program, you will be expected to participate in at least 3 hours of rehabilitation therapies Monday-Friday, with modified therapy programming on the weekends.  Your rehabilitation program will include the following services:  Physical Therapy (PT), Occupational Therapy (OT), 24 hour per day rehabilitation nursing, Therapeutic Recreaction (TR), Case Management (Social Worker), Rehabilitation Medicine, Nutrition Services and Pharmacy Services  Weekly team conferences will be held on Wednesday to discuss your progress.  Your Social Worker will talk with you frequently to get your input and to update you on team discussions.  Team conferences with you and your family in attendance may also be held.  Expected length of stay: 10-14 days Overall anticipated outcome: supervision with set up/cueing  Depending on your progress and recovery, your program may change. Your Social Worker will coordinate services and will keep you informed of any changes. Your Social Worker's name and contact numbers are listed  below.  The following services may also be recommended but are not provided by the Inpatient Rehabilitation Center:    Home Health Rehabiltiation Services  Outpatient Rehabilitation Services    Arrangements will be made to provide these services after discharge if needed.  Arrangements include referral to agencies that provide these services.  Your insurance has been verified to be:  Medicare & Medicaid Your primary doctor is:  Alehegh Asres  Pertinent information will be shared with your doctor and your insurance company.  Social Worker:  Dossie DerBecky Orlando Thalmann, SW  [phone] or ([phone]) [phone]  Information discussed with and copy given to patient by: Lucy Chrisupree, Sennie Borden G, 05/07/2015, 1:17 PM

## 2015-05-07 NOTE — Progress Notes (Signed)
[place] PHYSICAL MEDICINE & REHABILITATION     PROGRESS NOTE  Subjective/Complaints:  Patient seen sitting up in bed this morning. He gives brief responses. But notes that he slept well last night.  ROS: Denies CP, SOB, nausea, vomiting, diarrhea.  Objective: Vital Signs: Blood pressure 116/63, pulse 102, temperature 98.7 F (37.1 C), temperature source Oral, resp. rate 18, height 5\' 9"  (1.753 m), weight 89.812 kg (198 lb), SpO2 100 %. No results found. No results for input(s): WBC, HGB, HCT, PLT in the last 72 hours. No results for input(s): NA, K, CL, GLUCOSE, BUN, CREATININE, CALCIUM in the last 72 hours.  Invalid input(s): CO CBG (last 3)  No results for input(s): GLUCAP in the last 72 hours.  Wt Readings from Last 3 Encounters:  05/06/15 89.812 kg (198 lb)  04/07/15 104.8 kg (231 lb 0.7 oz)  03/07/15 102.694 kg (226 lb 6.4 oz)    Physical Exam:  BP 116/63 mmHg  Pulse 102  Temp(Src) 98.7 F (37.1 C) (Oral)  Resp 18  Ht 5\' 9"  (1.753 m)  Wt 89.812 kg (198 lb)  BMI 29.23 kg/m2  SpO2 100% Constitutional: NAD. Vital signs reviewed. Well-developed, well-nourished HENT: Normocephalic. Atraumatic Eyes: Conjunctivae and EOM are normal.   Cardiovascular: Normal rate and regular rhythm.  Respiratory: Effort normal. No respiratory distress.  GI: Soft. Bowel sounds are normal. He exhibits no distension.  Musculoskeletal: Left knee TTP. No edema, erythema, warmth.  Neurological: He is alert and? Oriented 2.  Patient will make limited eye contact with examiner.  Difficult to engage.  Follows simple commands.  Motor: Right upper extremity 5/5 proximal distal Left upper extremity: 4+/5 proximal to distal Right lower extremity: Hip flexion, knee extension 4/5, ankle dorsi/plantarflexion 5/5 Left lower extremity: Unwilling to flex hip or extending knee, ankle dorsi/plantarflexion 5/5 Skin:  Trach site healed.  Psychiatric:  Very quiet. Hesitant to engage.    Assessment/Plan: 1. Functional deficits secondary to debility which require 3+ hours per day of interdisciplinary therapy in a comprehensive inpatient rehab setting. Physiatrist is providing close team supervision and 24 hour management of active medical problems listed below. Physiatrist and rehab team continue to assess barriers to discharge/monitor patient progress toward functional and medical goals.  Function:  Bathing Bathing position      Bathing parts      Bathing assist        Upper Body Dressing/Undressing Upper body dressing                    Upper body assist        Lower Body Dressing/Undressing Lower body dressing                                  Lower body assist        Toileting Toileting          Toileting assist     Transfers Chair/bed transfer             Locomotion Ambulation           Wheelchair          Cognition Comprehension Comprehension assist level: Follows basic conversation/direction with extra time/assistive device  Expression Expression assist level: Expresses basic needs/ideas: With extra time/assistive device  Social Interaction Social Interaction assist level: Interacts appropriately 75 - 89% of the time - Needs redirection for appropriate language or to initiate interaction.  Problem Solving Problem  solving assist level: Solves basic 75 - 89% of the time/requires cueing 10 - 24% of the time  Memory Memory assist level: Requires cues to use assistive device    Medical Problem List and Plan: 1. Debilitation secondary to respiratory failure/ARDS/multilobar pneumonia /encephalopathy -Begin CIR 2. DVT Prophylaxis/Anticoagulation: SCDs/ambulate. Monitor for any signs of DVT 3. Pain Management: Tylenol 4. Mood/autism/mentally impaired: Klonopin 0.5 mg daily at bedtime, Provigil 100 mg daily.  5. Neuropsych: This patient is not capable of making decisions on his own behalf. 6.  Skin/Wound Care: Routine skin checks 7. Fluids/Electrolytes/Nutrition: Routine I/Os  8. Ventilatory dependent respiratory failure/tracheostomy. Decannulation 04/25/2015 9. Acute kidney failure. Resolved. Patient did receive dialysis for a short time. Cr normalized. 10. Vancomycin resistant enterococci urinary tract infection. Treated. Continue contact precautions 11. Tachycardia/atrial fibrillation. Cardiac rate control. Continue Cardizem 60 mg every 8 hours, Lopressor 25 mg twice a day  Will monitor with increased physical activity and adjust meds as necessary 12. History of sickle cell trait: Continue to monitor 13. Leukocytosis  WBCs 12.0 on 4/20  Will cont to monitor   LOS (Days) 1 A FACE TO FACE EVALUATION WAS PERFORMED  Marisabel Macpherson Karis Juba 05/07/2015 9:55 AM

## 2015-05-07 NOTE — Progress Notes (Signed)
Social Work  Social Work Assessment and Plan  Patient Details  Name: Juan Cameron MRN: [id number] Date of Birth: December 24, 1975  Today's Date: 05/07/2015  Problem List:  Patient Active Problem List   Diagnosis Date Noted  . Tachycardia   . Debility 05/06/2015  . Encephalopathy 05/06/2015  . 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 Ventilator dependence (HCC) 04/08/2015  . Pressure ulcer 04/05/2015  . Acute respiratory failure (HCC)   . Fever 03/21/2015  . Acute renal insufficiency 03/21/2015  . Obesity 03/21/2015  . ARDS (adult respiratory distress syndrome) (HCC)   . HCAP (healthcare-associated pneumonia) 03/11/2015  . Leukocytosis 03/11/2015  . Acute hyperglycemia 03/11/2015  . Left ventricular diastolic dysfunction, NYHA class 2 03/11/2015  . Thrombocytopenia (HCC) 03/11/2015  . Acute lung injury 03/10/2015  . Acute respiratory failure with hypoxia (HCC) 02/28/2015  . CAP (community acquired pneumonia) 02/28/2015  . Hypokalemia 02/28/2015  . Autism 02/28/2015   Past Medical History:  Past Medical History  Diagnosis Date  . Autism   . Sickle cell trait (HCC)   . Pneumonia 02/2015  . Autism    Past Surgical History:  Past Surgical History  Procedure Laterality Date  . No past surgeries     Social History:  reports that he has never smoked. He has never used smokeless tobacco. He reports that he does not drink alcohol or use illicit drugs.  Family / Support Systems Marital Status: Single Patient Roles: Other (Comment) (brother) Other Supports: [name]-sister  [phone]  Aunt [phone] Anticipated Caregiver: sister and Aunt [name] Ability/Limitations of Caregiver: [name] works days and goes to school. Aunt [name] care for prior to daycare and after daycare M-F Caregiver Availability: 24/7 Family Dynamics: Pt has a small family with his sister and aunt but has many cousins who come by and check on him. He likes to have company due to he gets lonely here. Will  ask TR to see and get him some activities to do when not in therapies.  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    Abuse/Neglect Physical Abuse: Denies Verbal Abuse: Denies Sexual Abuse: Denies Exploitation of patient/patient's resources: Denies Self-Neglect: Denies  Emotional Status Pt's affect, behavior adn adjustment status: Pt is tired from am therapies, he is watching videos on his phone which he likes to do. He likes watching funny videos on it. He wants to do well and go home, it has been a long time here in the hospital. His cousin is here visiting and tyring to entertain him. He is willing to participate and try in therapies. Recent Psychosocial Issues: other health issues-ADHD & sickle cell trait Pyschiatric History: No history seems to be coping appropriately and adjusting to the rehab unit. Will ask TR to get involved which is something pt would like and allow him to get to know [place] since we are all strangers to him. Provide support while here. Substance Abuse History: No issues  Patient / Family Perceptions, Expectations & Goals Pt/Family understanding of illness & functional limitations: Sister can explain her brother's hospitalization and has talked with MD and feels her questions have been answered. She comes each day after work so pt see's a Engineer, building services and has other fmaily members visit during the day.  Pt knows he is in a hospital and knows he was sick but is better now. Premorbid pt/family roles/activities: brother, nephew, cousin, etc Anticipated changes in roles/activities/participation: resume Pt/family expectations/goals: Sister  states: " I hope he does well and gets stronger while there."  Pt states: " I want to go home soon."  [employer] Community Agencies: Other (Comment) (Adult Daycare-M-F) Premorbid Home Care/DME Agencies: None Transportation available at discharge: family  Discharge Planning Living Arrangements: Other relatives Support Systems: Other relatives, Friends/neighbors Type of Residence: Private residence Insurance Resources: [employer]icare, [employer] Energy[employer]icaid (specify county) Surveyor, quantityinancial Resources: SSI Financial Screen Referred: No Living Expenses: Lives with family Money Management: Family Does the patient have any problems obtaining your medications?: No Home Management: sister Patient/Family Preliminary Plans: Return with sister and have Aunt assist also. Sister would like for him to stay at home with a caregiver until fully recovered and able to return to daycare. Sister is looking into caregiver for brother while she is working. Social Work Anticipated Follow Up Needs: HH/OP  Clinical Impression Pleasant young man who has been through a lot while he has been hospitalized. He is willing to participate and get well while here. His sister and Celine Ahrunt are involved and provide care to pt. Sister hopes to keep him home Until he is fully recovered and able to return to daycare. Will allow him to adjust to us and get TR involved for activities for him when not in therapies. Will keep sister updated regarding pt's LOS and goals.  Lucy Chrisupree, Marcelino Campos G 05/07/2015, 1:36 PM

## 2015-05-07 NOTE — Progress Notes (Signed)
Patient information reviewed and entered into eRehab system by Tora DuckMarie Claris Pech, RN, CRRN, PPS Coordinator.  Information including medical coding and functional independence measure will be reviewed and updated through discharge.     Per nursing patient was given "Data Collection Information Summary for Patients in Inpatient Rehabilitation Facilities with attached "Privacy Act Statement-Health Care Records" upon admission.  Reviewed with daughter due to patient's decreased cognitive status.

## 2015-05-07 NOTE — Progress Notes (Signed)
Physical Therapy Session Note  Patient Details  Name: Juan RipperRobert Faught MRN: [id number] Date of Birth: 09-Feb-1975  Today's Date: 05/07/2015 PT Individual Time: [id number] PT Individual Time Calculation (min): 59 min        Short Term Goals: Week 1:  PT Short Term Goal 1 (Week 1): Pt will perform squat/stand pivot transfers with steady A. PT Short Term Goal 2 (Week 1): Pt will ambulate 50 ft with steady A & LRAD. PT Short Term Goal 3 (Week 1): Pt will transfer sit<>stand with steady A.  Skilled Therapeutic Interventions/Progress Updates:    Patient received sitting EOB and agreeable to PT. Patient performed WC mobility in hall to and from room for 12750ft x2 with BLE propulsion with supervision A from PT with multiple rest breaks due to muscle soreness. Gait training for 6920ft x 2 with Mod A from PT and RW with cues for improved step width, step height, and proper use of AD with turns and transfer following gait. Throughout treatment patient performed sit<>stand and stand pivot transfer with mod A from PT and RW with verbal and visual instruction for improved AD management and UE positioning.  Supine therex:  Clam shells with level 2 tband 12x 2 SLR 8 x 2  SAQ 12x 2 Marches with level 2 tband 12x 2 Ankle PF with level 2 tabnd 12 x 2 PT provided constant verbal and tactle cueing for improved ROM and decreased speed of eccentric movement to improve strengthening aspect of movement.   Patient returned to room and left supine in bed with bed alarm set and call bell within reach.      Therapy Documentation Precautions:  Precautions Precautions: Fall Restrictions Weight Bearing Restrictions: No General:   Vital Signs: Therapy Vitals Temp: 98 F (36.7 C) Temp Source: Oral Pulse Rate: (!) 103 Resp: 18 BP: 124/73 mmHg Patient Position (if appropriate): Lying Oxygen Therapy SpO2: 96 % O2 Device: Not Delivered Pain: 0/10 : none   See Function Navigator for Current Functional  Status.   Therapy/Group: Individual Therapy  Golden Popustin E Aldean Suddeth 05/08/2015, 6:13 AM

## 2015-05-07 NOTE — IPOC Note (Signed)
Overall Plan of Care [facility](IPOC) Patient Details Name: Juan RipperRobert Saban MRN: [id number] DOB: Jul 12, 1975  Admitting Diagnosis: Debility  Hospital Problems: Principal Problem:   Debility Active Problems:   Encephalopathy   Tachycardia     Functional Problem List: Nursing Bladder, Bowel, Edema, Endurance, Medication Management, Motor, Safety, Skin Integrity  PT Balance, Endurance, Pain, Safety  OT Balance, Endurance, Safety  SLP    TR         Basic ADL's: OT Bathing, Dressing, Toileting     Advanced  ADL's: OT       Transfers: PT Bed Mobility, Bed to Chair, Furniture, Customer service managerCar  OT Toilet, Research scientist (life sciences)Tub/Shower     Locomotion: PT Ambulation, Psychologist, prison and probation servicesWheelchair Mobility, Stairs     Additional Impairments: OT    SLP        TR      Anticipated Outcomes Item Anticipated Outcome  Self Feeding Mod I  Swallowing      Basic self-care  Marketing executiveupervision  Toileting  Supervision   Bathroom Transfers Supervision  Bowel/Bladder  min assist  Transfers  supervision  Locomotion  supervision  Communication     Cognition     Pain  n/a  Safety/Judgment  min assist   Therapy Plan: PT Intensity: Minimum of 1-2 x/day ,45 to 90 minutes PT Frequency: 5 out of 7 days PT Duration Estimated Length of Stay: 10-14 days OT Intensity: Minimum of 1-2 x/day, 45 to 90 minutes OT Frequency: 5 out of 7 days OT Duration/Estimated Length of Stay: 7-10 days         Team Interventions: Nursing Interventions Patient/Family Education, Bladder Management, Bowel Management, Disease Management/Prevention, Medication Management, Discharge Planning  PT interventions Ambulation/gait training, Discharge planning, Functional mobility training, Therapeutic Activities, Psychosocial support, Wheelchair propulsion/positioning, Therapeutic Exercise, Neuromuscular re-education, Skin care/wound management, Balance/vestibular training, Cognitive remediation/compensation, DME/adaptive equipment instruction, Pain management, UE/LE  Strength taining/ROM, UE/LE Coordination activities, Stair training, Patient/family education, Community reintegration  OT Interventions Warden/rangerBalance/vestibular training, Discharge planning, Community reintegration, Fish farm managerDME/adaptive equipment instruction, Functional mobility training, Patient/family education, Self Care/advanced ADL retraining, Therapeutic Activities, Therapeutic Exercise, UE/LE Strength taining/ROM, UE/LE Coordination activities  SLP Interventions    TR Interventions    SW/CM Interventions Discharge Planning, Psychosocial Support, Patient/Family Education    Team Discharge Planning: Destination: PT-Home ,OT- Home , SLP-  Projected Follow-up: PT-Home health PT, 24 hour supervision/assistance, OT-  Home health OT, SLP-  Projected Equipment Needs: PT-Rolling walker with 5" wheels, OT- To be determined, SLP-  Equipment Details: PT- , OT-  Patient/family involved in discharge planning: PT- Patient,  OT- , SLP-   MD ELOS: 9-12 days Medical Rehab Prognosis:  Excellent Assessment: 40 year old right handed African-American male with past medical history significant for autism with element of attention deficit hyperactivity disorder as well as sickle cell trait. [facility] and discharged to home on 03/10/2015 and still using 4 L of oxygen. He was admitted back to the hospital 04/07/2015 with respiratory failure and ARDS. Patient did require intubation placed on broad-spectrum antibiotics. His hospital stay was complicated by acute kidney injury with dialysis initiated that he did receive for a short time and creatinine has now returned back to normal levels at 1.11. Required tracheostomy 03/20/2015 and after failed [facility]. Placed on Provigil for mood stabilization suspect encephalopathy with noted history of autism with good results. He was ultimately  decannulated 04/25/2015. His diet was slowly advanced to a regular consistency bouts of  tachycardia atrial fibrillation converted back to normal sinus rhythm maintained on Cardizem as well as Lopressor. Vancomycin-resistant enterococci urinary tract infection treated and remains on contact precautions. Patient still with generalized weakness and limited willingness to engage. Focus will be on improving mobility and safety. Goals are supervision with PT and supervision with OT.   See Team Conference Notes for weekly updates to the plan of care

## 2015-05-07 NOTE — Evaluation (Signed)
Occupational Therapy Assessment and Plan  Patient Details  Name: Juan Cameron MRN: [id number] Date of Birth: Jun 09, 1975  OT Diagnosis: abnormal posture and muscle weakness (generalized) Rehab Potential: Rehab Potential (ACUTE ONLY): Good ELOS: 7-10 days   Today's Date: 05/07/2015 OT Individual Time: 1300-1400 OT Individual Time Calculation (min): 60 min     Problem List:  Patient Active Problem List   Diagnosis Date Noted  . Tachycardia   . Debility 05/06/2015  . Encephalopathy 05/06/2015  . 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 Ventilator dependence ([place]) 04/08/2015  . Pressure ulcer 04/05/2015  . Acute respiratory failure ([place])   . Fever 03/21/2015  . Acute renal insufficiency 03/21/2015  . Obesity 03/21/2015  . ARDS (adult respiratory distress syndrome) ([place])   . HCAP (healthcare-associated pneumonia) 03/11/2015  . Leukocytosis 03/11/2015  . Acute hyperglycemia 03/11/2015  . Left ventricular diastolic dysfunction, NYHA class 2 03/11/2015  . Thrombocytopenia ([place]) 03/11/2015  . Acute lung injury 03/10/2015  . Acute respiratory failure with hypoxia ([place]) 02/28/2015  . CAP (community acquired pneumonia) 02/28/2015  . Hypokalemia 02/28/2015  . Autism 02/28/2015    Past Medical History:  Past Medical History  Diagnosis Date  . Autism   . Sickle cell trait ([place])   . Pneumonia 02/2015  . Autism    Past Surgical History:  Past Surgical History  Procedure Laterality Date  . No past surgeries      Assessment & Plan Clinical Impression: 40 year old right handed African-American male with past medical history significant for autism with element of attention deficit hyperactivity disorder as well as sickle cell trait. Patient lives with his sister and attends adult daycare workshop daily. Independent prior to admission. Patient originally admitted to [facility] for multilobar pneumonia and discharged to home on 03/10/2015 and still using 4 L of oxygen. He was  admitted back to the hospital 04/07/2015 with respiratory failure and ARDS. Patient did require intubation placed on broad-spectrum antibiotics. His hospital stay was complicated by acute kidney injury with dialysis initiated that he did receive a short time and creatinine has now returned back to normal levels at 1.11. Required tracheostomy 03/20/2015 and after failed extubation [facility]. Placed on Provigil for mood stabilization suspect encephalopathy with noted history of autism with good results. He was ultimately decannulated 04/25/2015. His diet was slowly advanced to a regular consistency bouts of tachycardia atrial fibrillation converted back to normal sinus rhythm maintained on Cardizem as well as Lopressor. Vancomycin-resistant enterococci urinary tract infection treated and remains on contact precautions. Patient still with generalized weakness therapies initiated. Patient was admitted for a comprehensive rehabilitation program Patient transferred to CIR on 05/06/2015 .    Patient currently requires min-mod with basic self-care skills secondary to muscle weakness, decreased cardiorespiratoy endurance and decreased standing balance and decreased postural control.  Prior to hospitalization, patient could complete ADLs with supervision- independence  Patient will benefit from skilled intervention to decrease level of assist with basic self-care skills and increase independence with basic self-care skills prior to discharge home with care partner.  Anticipate patient will require 24 hour supervision and follow up home health vs no further OT follow up depending on pt progress  OT - End of Session Activity Tolerance: Tolerates 10 - 20 min activity with multiple rests Endurance Deficit: Yes Endurance Deficit Description: Required multiple rest breaks during seated bathing/dressing session OT Assessment Rehab Potential (ACUTE ONLY):  Good OT Patient demonstrates impairments in the following area(s): Balance;Endurance;Safety  OT Basic ADL's Functional Problem(s): Bathing;Dressing;Toileting OT Transfers Functional Problem(s): Toilet;Tub/Shower OT Plan OT Intensity: Minimum of 1-2 x/day, 45 to 90 minutes OT Frequency: 5 out of 7 days OT Duration/Estimated Length of Stay: 7-10 days OT Treatment/Interventions: Balance/vestibular training;Discharge planning;Community reintegration;DME/adaptive equipment instruction;Functional mobility training;Patient/family education;Self Care/advanced ADL retraining;Therapeutic Activities;Therapeutic Exercise;UE/LE Strength taining/ROM;UE/LE Coordination activities OT Self Feeding Anticipated Outcome(s): Mod I OT Basic Self-Care Anticipated Outcome(s): Supervision OT Toileting Anticipated Outcome(s): Supervision OT Bathroom Transfers Anticipated Outcome(s): Supervision OT Recommendation Patient destination: Home Follow Up Recommendations: Home health OT Equipment Recommended: To be determined   Skilled Therapeutic Intervention Pt seen for OT eval and ADL bathing/dressing session. Pt in recliner upon arrival, excited to get bathed and dressed. He communicated throughout session with 1-3 word answers or via pointing. He pointed that he wanted to complete bathing from w/c level at sink.  He ambulated with RW to w/c with min-mod A and verbal/ tactile cuing for use of RW. He completed bathing from w/c level. He required B UE support standing at sink while therapist completed buttock hygiene. Verbal and tactile cuing provided for positioning of LEs for wider BOS.  He completed stand pivot transfer to toilet using grab bars. Assist required for positioning of hips as pt trying to sit before all the way at the toilet.  He self propelled w/c throughout unit using B LEs and oriented to unit. Rest breaks required and therapist assisted with returning to room. He requested to return to bed at end of  session, left sitting on EOB as pt declining returning to supine. Bed alarm set and RN and NT made aware of pt's position.  Educated regarding use of call bell and role of OT.   OT Evaluation Precautions/Restrictions  Precautions Precautions: Fall Restrictions Weight Bearing Restrictions: No General Chart Reviewed: Yes Additional Pertinent History: Dx of Autism with cognitive level of 57-50 year old Pain   Not formally assessed, however, pt did not appear to be in any distress Home Living/Prior [place] expects to be discharged to:: Private residence Living Arrangements: Other relatives Available Help at Discharge: Family, Available 24 hours/day Type of Home: House Home Access: Stairs to enter [employer] of Steps: 2-3 steps Entrance Stairs-Rails: None Home Layout: One level Bathroom Shower/Tub: Tub/shower unit, Architectural technologist: Standard Additional Comments: Obtained info via chart as no family present and pt unable to provide information due to baseline cognition  Lives With: Other (Comment) (Sister, Stage manager) IADL History 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 Prior Function Level of Independence: Independent with basic ADLs, Independent with homemaking with ambulation, Independent with gait, Independent with transfers, Needs assistance with homemaking  Able to Take Stairs?: Yes Leisure: Hobbies-yes (Comment) Comments: Loves bowling and video games Vision/Perception  Vision- History Baseline Vision/History: No visual deficits Vision- Assessment Vision Assessment?: No apparent visual deficits Additional Comments: Unable to formally assess due to baseline cognition but appears to be [facility]  Cognition Overall Cognitive Status: History of cognitive impairments - at baseline (Pt with dx of autism and baseline cogniton of 4-5 year old. Per chart,  family reports he is at baseline cognitively) Arousal/Alertness:  Awake/alert Year: Other (Comment) (Unable to state) Day of Week: Other (Comment) (Unable to state) Immediate Memory Recall:  (Not assessed as pt not at cognitive level to participate) Awareness: Impaired Problem Solving: Impaired Safety/Judgment: Impaired Comments: Pt is autisitc with baseline cognition of 49-89 year old. Family reports he is at baseline for cognition Sensation Sensation Additional Comments: Unable to formally assess  due to cognition, however, appears to be [facility] Motor  Motor Motor: Within Functional Limits Motor - Skilled Clinical Observations: Generalized weakness Trunk/Postural Assessment  Cervical Assessment Cervical Assessment: Exceptions to [facility] (forward tilt) Thoracic Assessment Thoracic Assessment: Exceptions to [facility] (Flexed posture) Lumbar Assessment Lumbar Assessment: Exceptions to [facility] (Posterior pelvic tilt) Postural Control Postural Control: Deficits on evaluation (Generalized weakness)  Balance Balance Balance Assessed: Yes Static Sitting Balance Static Sitting - Balance Support: Feet supported Static Sitting - Level of Assistance: 5: Stand by assistance Dynamic Sitting Balance Dynamic Sitting - Balance Support: During functional activity Dynamic Sitting - Level of Assistance: 5: Stand by assistance;4: Min assist Static Standing Balance Static Standing - Balance Support: During functional activity;Right upper extremity supported;Left upper extremity supported Static Standing - Level of Assistance: 4: Min assist;3: Mod assist Dynamic Standing Balance Dynamic Standing - Balance Support: During functional activity;Right upper extremity supported;Left upper extremity supported Dynamic Standing - Level of Assistance: 3: Mod assist;2: Max assist Dynamic Standing - Comments: Stnading to complete LB dressing task Extremity/Trunk Assessment RUE Assessment RUE Assessment:  (Unable to formally assess due to cognition, however, appears WFL) LUE Assessment LUE  Assessment: Within Functional Limits (Unable to formally assess due to cognition, however, appears [facility])   See Function Navigator for Current Functional Status.   Refer to Care Plan for Long Term Goals  Recommendations for other services: None  Discharge Criteria: Patient will be discharged from OT if patient refuses treatment 3 consecutive times without medical reason, if treatment goals not met, if there is a change in medical status, if patient makes no progress towards goals or if patient is discharged from hospital.  The above assessment, treatment plan, treatment alternatives and goals were discussed and mutually agreed upon: by patient  Ernestina Patches 05/07/2015, 3:33 PM

## 2015-05-07 NOTE — Evaluation (Signed)
Physical Therapy Assessment and Plan  Patient Details  Name: Keontay Vora MRN: [id number] Date of Birth: 04/28/1975  PT Diagnosis: Abnormality of gait, Cognitive deficits, Difficulty walking and Muscle weakness Rehab Potential: Good ELOS: 10-14 days   Today's Date: 05/07/2015 PT Individual Time: 800-901 and 1131-1158 PT Individual Time Calculation (min): 27 min  And 61 minutes  Problem List:  Patient Active Problem List   Diagnosis Date Noted  . Tachycardia   . Debility 05/06/2015  . Encephalopathy 05/06/2015  . 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 Ventilator dependence ([place]) 04/08/2015  . Pressure ulcer 04/05/2015  . Acute respiratory failure ([place])   . Fever 03/21/2015  . Acute renal insufficiency 03/21/2015  . Obesity 03/21/2015  . ARDS (adult respiratory distress syndrome) ([place])   . HCAP (healthcare-associated pneumonia) 03/11/2015  . Leukocytosis 03/11/2015  . Acute hyperglycemia 03/11/2015  . Left ventricular diastolic dysfunction, NYHA class 2 03/11/2015  . Thrombocytopenia ([place]) 03/11/2015  . Acute lung injury 03/10/2015  . Acute respiratory failure with hypoxia ([place]) 02/28/2015  . CAP (community acquired pneumonia) 02/28/2015  . Hypokalemia 02/28/2015  . Autism 02/28/2015    Past Medical History:  Past Medical History  Diagnosis Date  . Autism   . Sickle cell trait ([place])   . Pneumonia 02/2015  . Autism    Past Surgical History:  Past Surgical History  Procedure Laterality Date  . No past surgeries      Assessment & Plan Clinical Impression: Patient is a 40 y.o. year old male with past medical history significant for autism with element of attention deficit hyperactivity disorder as well as sickle cell trait. Patient lives with his sister and attends adult daycare workshop daily. Independent prior to admission. Patient originally admitted to [facility] for multilobar pneumonia and discharged to home on 03/10/2015 and still using 4 L of oxygen.  He was admitted back to the hospital 04/07/2015 with respiratory failure and ARDS. Patient did require intubation placed on broad-spectrum antibiotics. His hospital stay was complicated by acute kidney injury with dialysis initiated that he did receive a short time and creatinine has now returned back to normal levels at 1.11. Required tracheostomy 03/20/2015 and after failed extubation [facility]. Placed on Provigil for mood stabilization suspect encephalopathy with noted history of autism with good results. He was ultimately decannulated 04/25/2015. His diet was slowly advanced to a regular consistency bouts of tachycardia atrial fibrillation converted back to normal sinus rhythm maintained on Cardizem as well as Lopressor. Vancomycin-resistant enterococci urinary tract infection treated and remains on contact precautions. Patient still with generalized weakness therapies initiated. Patient was admitted for a comprehensive rehabilitation program.  Patient transferred to CIR on 05/06/2015 .   Patient currently requires mod with mobility secondary to muscle weakness, decreased cardiorespiratoy endurance, decreased attention, decreased awareness, decreased problem solving and decreased safety awareness and decreased sitting balance, decreased standing balance and decreased balance strategies.  Prior to hospitalization, patient was independent  with mobility and lived with  (sister) in a House home.  Home access is 3Stairs to enter.  Patient will benefit from skilled PT intervention to maximize safe functional mobility, minimize fall risk and decrease caregiver burden for planned discharge home with 24 hour supervision.  Anticipate patient will benefit from follow up [place] at discharge.  PT - End of Session Activity Tolerance: Tolerates 30+ min activity with multiple rests Endurance Deficit: Yes Endurance Deficit Description: 2/2 fatigue & muscle  weakness PT Assessment  Rehab Potential (ACUTE/IP ONLY): Good PT Patient demonstrates impairments in the following area(s): Balance;Endurance;Pain;Safety PT Transfers Functional Problem(s): Bed Mobility;Bed to Chair;Furniture;Car PT Locomotion Functional Problem(s): Ambulation;Wheelchair Mobility;Stairs PT Plan PT Intensity: Minimum of 1-2 x/day ,45 to 90 minutes PT Frequency: 5 out of 7 days PT Duration Estimated Length of Stay: 10-14 days PT Treatment/Interventions: Ambulation/gait training;Discharge planning;Functional mobility training;Therapeutic Activities;Psychosocial support;Wheelchair propulsion/positioning;Therapeutic Exercise;Neuromuscular re-education;Skin care/wound management;Balance/vestibular training;Cognitive remediation/compensation;DME/adaptive equipment instruction;Pain management;UE/LE Strength taining/ROM;UE/LE Coordination activities;Stair training;Patient/family education;Community reintegration PT Transfers Anticipated Outcome(s): supervision PT Locomotion Anticipated Outcome(s): supervision PT Recommendation Follow Up Recommendations: Home health PT;24 hour supervision/assistance Patient destination: Home Equipment Recommended: Rolling walker with 5" wheels  Skilled Therapeutic Intervention Pt received in bed & agreeable to PT, denying c/o pain. Throughout evaluation PT provided short, one step commands. Pt able to transfer to sitting EOB with supervision & bed rails. Manual testing completed, please see below. Pt able to perform squat pivot bed>w/c with Mod A and car transfer w/c<>car with Min A. Pt self propelled w/c with BUE x 40 ft for cardiovascular endurance before reporting fatigue. Encouraged pt to attempt stair negotiation and with heavy encouragement from PT pt willing. Pt able to ascend bottom step x 2 trials with B rails & Mod A. Pt required seated rest break, reporting "I'm weak". Gait training over even surface and attempted without AD but pt abruptly  transferred to sitting on mat table. Gait training with RW & pt able to ambulate 10 ft with Mod A 2/2 multiple losses of balance. Pt presents with decreased step length & step width BLE. At end of session pt returned to room & completed squat pivot w/c>recliner. PT reviewed & pt able to successfully demonstrate use of call bell. Pt left with BLE elevated, QRB in place & set up with meal tray.  Treatment 2: Pt received in recliner & agreeable to PT. Pt transferred recliner>w/c via squat pivot with Min A. Pt requires cuing to complete full pivot before turning to sit in chair. Gait training over uneven surface with RW x 10 ft with Mod A as pt required assistance to navigate RW over surface. Pt required cuing to push walker instead of picking it up & advancing it, and pt with unsteadiness on feet. Pt able to retreive object from floor with Mod A & 1UE support. Pt returned to room & transferred w/c>recliner after PT placed pillow in recliner for cushioning to buttocks. Pt required Min A to complete squat pivot & pt left with BLE elevated, QRB in place & all needs within reach.   PT Evaluation Precautions/Restrictions Precautions Precautions: Fall Restrictions Weight Bearing Restrictions: No General Chart Reviewed: Yes Additional Pertinent History: Hx of Autism Response to Previous Treatment: Not applicable Family/Caregiver Present: No  Vital Signs Therapy Vitals Pulse Rate: 95 (after ambulating) BP: 114/67 mmHg (after ambulating) Patient Position (if appropriate): Sitting Oxygen Therapy SpO2: 95 % (room air) Pulse Oximetry Type: Intermittent Pain Pain Assessment Pain Assessment: No/denies pain Home Living/Prior Functioning Home Living Available Help at Discharge: Family Type of Home: House Home Access: Stairs to enter Technical brewer of Steps: 3 Entrance Stairs-Rails: None Home Layout: One level Bathroom Shower/Tub: Product/process development scientist: Standard Additional  Comments: Obtained info via chart as no family present and pt unable to provide information due to baseline cognition  Lives With:  (sister) Prior Function Level of Independence: Independent with gait;Independent with transfers  Able to Take Stairs?: Yes Driving: No 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 Family reports he is at baseline for cognition Sensation Sensation Light Touch: Appears Intact (BLE) Motor  Motor Motor: Within Functional Limits Motor - Skilled Clinical Observations: Generalized weakness  Mobility Bed Mobility Bed Mobility: Rolling Right;Rolling Left;Supine to Sit;Sit to Supine Rolling Right: 5: Supervision Rolling Left: 5: Supervision Supine to Sit: 5: Supervision (with bed rails) Sit to Supine: 5: Supervision (long sit to supine) Transfers Transfers: Yes Sit to Stand: 4: Min assist Sit to Stand Details: Verbal cues for technique Squat Pivot Transfers: 3: Mod assist Squat Pivot Transfer Details: Verbal cues for precautions/safety;Verbal cues for technique;Verbal cues for sequencing;Visual cues/gestures for precautions/safety Locomotion  Ambulation Ambulation: Yes Ambulation/Gait Assistance: 4: Min assist Ambulation Distance (Feet): 10 Feet Assistive device: Rolling walker Ambulation/Gait Assistance Details: Verbal cues for technique;Verbal cues for sequencing;Manual facilitation for placement Gait Gait: Yes Gait  Pattern: Decreased step length - left;Decreased step length - right;Narrow base of support Stairs / Additional Locomotion Stairs: Yes Stairs Assistance: 3: Mod assist Stair Management Technique: Two rails Number of Stairs: 2 (1 step + 1 step) Height of Stairs: 3 (inches) Wheelchair Mobility Wheelchair Mobility: Yes Wheelchair Assistance: 5: Careers information officer: Both upper extremities Wheelchair Parts Management: Needs assistance Distance: 40 ft  Trunk/Postural Assessment  Cervical Assessment Cervical Assessment: Exceptions to [facility] (forward tilt) Thoracic Assessment Thoracic Assessment: Exceptions to [facility] (Flexed posture) Lumbar Assessment Lumbar Assessment: Exceptions to [facility] (Posterior pelvic tilt) Postural Control Postural Control: Deficits on evaluation (Generalized weakness)  Balance Balance Balance Assessed: Yes Dynamic Sitting Balance Dynamic Sitting - Balance Support: No upper extremity supported;During functional activity;Feet supported (lost balance posteriorly during MMT) Dynamic Sitting - Level of Assistance: 4: Min assist Static Standing Balance Static Standing - Balance Support: Bilateral upper extremity supported Static Standing - Level of Assistance: 4: Min assist  Extremity Assessment  RLE Assessment RLE Assessment:  (AROM = WFL, grossly 3/5 MMT) LLE Assessment LLE Assessment:  (AROM = WFL, grossly 3/5 MMT)   See Function Navigator for Current Functional Status.   Refer to Care Plan for Long Term Goals  Recommendations for other services: None  Discharge Criteria: Patient will be discharged from PT if patient refuses treatment 3 consecutive times without medical reason, if treatment goals not met, if there is a change in medical status, if patient makes no progress towards goals or if patient is discharged from hospital.  The above assessment, treatment plan, treatment alternatives and goals were discussed and mutually agreed upon: by  patient  Waunita Schooner 05/07/2015, 5:37 PM

## 2015-05-07 NOTE — Plan of Care (Signed)
Problem: RH Balance Goal: LTG Patient will maintain dynamic standing balance (PT) LTG: Patient will maintain dynamic standing balance with assistance during mobility activities (PT) With LRAD  Problem: RH Bed Mobility Goal: LTG Patient will perform bed mobility with assist (PT) LTG: Patient will perform bed mobility with assistance, with/without cues (PT). Without hospital bed features  Problem: RH Bed to Chair Transfers Goal: LTG Patient will perform bed/chair transfers w/assist (PT) LTG: Patient will perform bed/chair transfers with assistance, with/without cues (PT). With LRAD  Problem: RH Car Transfers Goal: LTG Patient will perform car transfers with assist (PT) LTG: Patient will perform car transfers with assistance (PT). With LRAD  Problem: RH Furniture Transfers Goal: LTG Patient will perform furniture transfers w/assist (OT/PT LTG: Patient will perform furniture transfers with assistance (OT/PT). With LRAD  Problem: RH Ambulation Goal: LTG Patient will ambulate in controlled environment (PT) LTG: Patient will ambulate in a controlled environment, # of feet with assistance (PT). 150 ft with LRAD Goal: LTG Patient will ambulate in home environment (PT) LTG: Patient will ambulate in home environment, # of feet with assistance (PT). 50 ft with LRAD Goal: LTG Patient will ambulate in community environment (PT) LTG: Patient will ambulate in community environment, # of feet with assistance (PT). 50 ft with LRAD  Problem: RH Wheelchair Mobility Goal: LTG Patient will propel w/c in controlled environment (PT) LTG: Patient will propel wheelchair in controlled environment, # of feet with assist (PT) 150 ft for cardiovascular endurance & strength training  Problem: RH Stairs Goal: LTG Patient will ambulate up and down stairs w/assist (PT) LTG: Patient will ambulate up and down # of stairs with assistance (PT) 3 steps without rails with LRAD

## 2015-05-08 ENCOUNTER — Inpatient Hospital Stay (HOSPITAL_COMMUNITY): Payer: Medicare Other

## 2015-05-08 ENCOUNTER — Inpatient Hospital Stay (HOSPITAL_COMMUNITY): Payer: Medicare Other | Admitting: Physical Therapy

## 2015-05-08 ENCOUNTER — Inpatient Hospital Stay (HOSPITAL_COMMUNITY): Payer: Medicare Other | Admitting: Occupational Therapy

## 2015-05-08 DIAGNOSIS — R4189 Other symptoms and signs involving cognitive functions and awareness: Secondary | ICD-10-CM

## 2015-05-08 DIAGNOSIS — E876 Hypokalemia: Secondary | ICD-10-CM

## 2015-05-08 DIAGNOSIS — F840 Autistic disorder: Secondary | ICD-10-CM

## 2015-05-08 LAB — CBC WITH DIFFERENTIAL/PLATELET
BASOS PCT: 0 %
Basophils Absolute: 0.1 10*3/uL (ref 0.0–0.1)
Eosinophils Absolute: 0.5 10*3/uL (ref 0.0–0.7)
Eosinophils Relative: 4 %
HEMATOCRIT: 27.4 % — AB (ref 39.0–52.0)
HEMOGLOBIN: 9.3 g/dL — AB (ref 13.0–17.0)
LYMPHS ABS: 2.3 10*3/uL (ref 0.7–4.0)
LYMPHS PCT: 19 %
MCH: 27.8 pg (ref 26.0–34.0)
MCHC: 33.9 g/dL (ref 30.0–36.0)
MCV: 81.8 fL (ref 78.0–100.0)
MONOS PCT: 10 %
Monocytes Absolute: 1.2 10*3/uL — ABNORMAL HIGH (ref 0.1–1.0)
NEUTROS ABS: 8.1 10*3/uL — AB (ref 1.7–7.7)
NEUTROS PCT: 67 %
Platelets: 322 10*3/uL (ref 150–400)
RBC: 3.35 MIL/uL — ABNORMAL LOW (ref 4.22–5.81)
RDW: 16 % — ABNORMAL HIGH (ref 11.5–15.5)
WBC: 12 10*3/uL — ABNORMAL HIGH (ref 4.0–10.5)

## 2015-05-08 LAB — BASIC METABOLIC PANEL
ANION GAP: 11 (ref 5–15)
BUN: 7 mg/dL (ref 6–20)
CHLORIDE: 107 mmol/L (ref 101–111)
CO2: 24 mmol/L (ref 22–32)
Calcium: 9.1 mg/dL (ref 8.9–10.3)
Creatinine, Ser: 1.1 mg/dL (ref 0.61–1.24)
GFR calc non Af Amer: >60 mL/min (ref >60)
Glucose, Bld: 99 mg/dL (ref 65–99)
POTASSIUM: 3.4 mmol/L — AB (ref 3.5–5.1)
Sodium: 142 mmol/L (ref 135–145)

## 2015-05-08 MED ORDER — METOPROLOL TARTRATE 50 MG PO TABS
50.0000 mg | ORAL_TABLET | Freq: Two times a day (BID) | ORAL | Status: DC
  Administered 2015-05-08 – 2015-05-10 (×5): 50 mg via ORAL
  Filled 2015-05-08 (×5): qty 1

## 2015-05-08 NOTE — Progress Notes (Signed)
Occupational Therapy Session Note  Patient Details  Name: Alan RipperRobert Klausing MRN: [id number] Date of Birth: 1975/10/25  Today's Date: 05/08/2015 OT Individual Time: [id number] OT Individual Time Calculation (min): 66 min    Short Term Goals: Week 1:  OT Short Term Goal 1 (Week 1): STG=LTG due to LOS  Skilled Therapeutic Interventions/Progress Updates: patient participated in strengthening activities as he refused to work on toileting or other transfers except those related to transferring onto/off of exercise related equipment.   He was able to communicate to this clinician that he like to do exercises.  He completed the NuStep strengtheing arm and legs and endurance for approximately 20 minutes on levels 2-5 (varied with fatigue).   He completed upper extremity exercises with 1.5 pounds each arms in a vertical plane.     He asked to walk from the Nu Step to his chair approximately 3 feet away (with CGA and no assitve device) He also completed bowling via WII and exercises his right arm and worked on unsupported sitting balance without loss of balance  He was assisted onto his bed with close supervision to rest before nxt afternoon session.    He was left with his call bell, cell phone and room phone in place and his bed alarm engaged.   He demonstrated understanding how to use his call bell.     Therapy Documentation Precautions:  Precautions Precautions: Fall Restrictions Weight Bearing Restrictions: No   Pain: pointed to his knees although he appeared not able to rate on an audible pain scale  See Function Navigator for Current Functional Status.   Therapy/Group: Individual Therapy  Bud Faceickett, [place] [facility] 05/08/2015, 2:02 PM

## 2015-05-08 NOTE — Progress Notes (Signed)
Occupational Therapy Session Note  Patient Details  Name: Juan Cameron MRN: [id number] Date of Birth: January 15, 1975  Today's Date: 05/08/2015 OT Individual Time: 1015-1100 OT Individual Time Calculation (min): 45 min   Short Term Goals: Week 1:  OT Short Term Goal 1 (Week 1): STG=LTG due to LOS  Skilled Therapeutic Interventions/Progress Updates: ADL-retraining sittng and standing at sink from w/c with min instructional cues and intermittent mod assist to rise from sit to standing supported at sink.   Pt required mod vc to engage and progress through session d/t depravity of social/language skills however he demonstrated good thoroughness and awareness.   Pt rejected shower level ADL and repeated desire to strengthen his LE with re-ed provided on benefit of repeated sit<>stand while maintaining hygiene.      Therapy Documentation Precautions:  Precautions Precautions: Fall Restrictions Weight Bearing Restrictions: No  Pain: Pain Assessment Pain Assessment: No/denies pain  See Function Navigator for Current Functional Status.   Therapy/Group: Individual Therapy  Winford Hehn 05/08/2015, 12:44 PM

## 2015-05-08 NOTE — Progress Notes (Signed)
Physical Therapy Session Note  Patient Details  Name: Juan Cameron MRN: [id number] Date of Birth: [birth year]  Today's Date: 05/08/2015 PT Individual Time: 0900-1000 AND 2773-7505 PT Individual Time Calculation (min): 60 min AND 30 minutes   Short Term Goals: Week 1:  PT Short Term Goal 1 (Week 1): Pt will perform squat/stand pivot transfers with steady A. PT Short Term Goal 2 (Week 1): Pt will ambulate 50 ft with steady A & LRAD. PT Short Term Goal 3 (Week 1): Pt will transfer sit<>stand with steady A.  Skilled Therapeutic Interventions/Progress Updates:    Patient received supine in bed and performed bed mobility with supervision A from PT with min cues for improved UE positioning. WC mob with BLE for 170f x2 with one rest break each time with moderate verbal cues from PT for decreased speed with LE propulsion to prevent fatigue and obstacle negotiation in hall. Gait training 216fx 3 with RW and min A from PT verbal and tactile cues for improved weight shift, trunk activation for improved stability with swing through, and to increase step width for greater BOS. Sit stand x 20 throughout treatment session with min progressing to mod A and cues for weight shift and UE placement. Standing balance to play wii bowling for up to 1 minunte with 1 UE supported and min A-mod A from PT to prevent posterior LOB.  [facility] with call bell within reach and all needs met.   Session 2    Patient received supine in bed with all bed rails in raised position and pt's sister present. Patient was and agreeable to PT. Upon sit to stand, patient was found to have incontinent bladded episode, causing clothes and sheet to be wet. Patient reported that he had called RN, but noone had come to assist. Treatment session focused on functional mobility for transfers. Pt's sister helped pt get torso cleaned. Sit to stand with mod A and RW x 5 to don/doff pants, socks and shoes with  mod-max A from PT for time management. Patient performed ambulation in room for [id number] RW x 4 for transfers to various sitting surfaces for continued clean up of soiled surfaces. Patient  [facility] with sister present in room and call bell within reach. NT notified of incontinent episode.     Therapy Documentation Precautions:  Precautions Precautions: Fall Restrictions Weight Bearing Restrictions: No General:   Vital Signs: Therapy Vitals Pulse Rate: (!) 115 BP: 136/80 mmHg  See Function Navigator for Current Functional Status.   Therapy/Group: Individual Therapy  AusLorie Phenix27/2017, 9:59 AM

## 2015-05-08 NOTE — Progress Notes (Signed)
[place] PHYSICAL MEDICINE & REHABILITATION     PROGRESS NOTE  Subjective/Complaints:  Patient sitting up in bed this morning laying on his phone. He is not willing to engage much and suggests that I am bothering him from playing on his phone.  ROS: Denies CP, SOB, nausea, vomiting, diarrhea.  Objective: Vital Signs: Blood pressure 136/80, pulse 115, temperature 98 F (36.7 C), temperature source Oral, resp. rate 18, height  (1.753 m), weight 88.905 kg (196 lb), SpO2 96 %. No results found.  Recent Labs  05/08/15 0550  WBC 12.0*  HGB 9.3*  HCT 27.4*  PLT 322    Recent Labs  05/08/15 0550  NA 142  K 3.4*  CL 107  GLUCOSE 99  BUN 7  CREATININE 1.10  CALCIUM 9.1   CBG (last 3)  No results for input(s): GLUCAP in the last 72 hours.  Wt Readings from Last 3 Encounters:  05/08/15 88.905 kg (196 lb)  04/07/15 104.8 kg (231 lb 0.7 oz)  03/07/15 102.694 kg (226 lb 6.4 oz)    Physical Exam:  BP 136/80 mmHg  Pulse 115  Temp(Src) 98 F (36.7 C) (Oral)  Resp 18  Ht  (1.753 m)  Wt 88.905 kg (196 lb)  BMI 28.93 kg/m2  SpO2 96% Constitutional: NAD. Vital signs reviewed. Well-developed, well-nourished. He requires significant encouragement to participate HENT: Normocephalic. Atraumatic Eyes: Conjunctivae and EOM are normal.   Cardiovascular: + Tachycardic. Regular rhythm Respiratory: Effort normal. No respiratory distress.  GI: Soft. Bowel sounds are normal. He exhibits no distension.  Musculoskeletal: Left knee TTP. No edema, erythema, warmth.  Neurological: He is alert and? Oriented 2.  Patient will make limited eye contact with examiner.  Difficult to engage.  Follows simple commands.  Motor: Right upper extremity 5/5 proximal distal Left upper extremity: 4+/5 proximal to distal Right lower extremity: Hip flexion, knee extension 4/5, ankle dorsi/plantarflexion 5/5 Left lower extremity: Hip flexion, knee extension? 4/5, ankle dorsi/plantarflexion  5/5 Skin:  Trach site healed.  Psychiatric:  Very quiet. Hesitant to engage.   Assessment/Plan: 1. Functional deficits secondary to debility which require 3+ hours per day of interdisciplinary therapy in a comprehensive inpatient rehab setting. Physiatrist is providing close team supervision and 24 hour management of active medical problems listed below. Physiatrist and rehab team continue to assess barriers to discharge/monitor patient progress toward functional and medical goals.  Function:  Bathing Bathing position   Position: Wheelchair/chair at sink  Bathing parts Body parts bathed by patient: Right arm, Left arm, Chest, Abdomen, Front perineal area, Right upper leg, Left upper leg, Right lower leg, Left lower leg Body parts bathed by helper: Buttocks, Back  Bathing assist Assist Level: Touching or steadying assistance(Pt > 75%)      Upper Body Dressing/Undressing Upper body dressing   What is the patient wearing?: Pull over shirt/dress     Pull over shirt/dress - Perfomed by patient: Thread/unthread right sleeve, Thread/unthread left sleeve, Put head through opening, Pull shirt over trunk          Upper body assist Assist Level: Set up, Supervision or verbal cues   Set up : To obtain clothing/put away  Lower Body Dressing/Undressing Lower body dressing   What is the patient wearing?: Underwear, Pants, Socks, Shoes Underwear - Performed by patient: Thread/unthread right underwear leg, Thread/unthread left underwear leg Underwear - Performed by helper: Pull underwear up/down Pants- Performed by patient: Thread/unthread right pants leg, Thread/unthread left pants leg Pants- Performed by helper: Pull  pants up/down     Socks - Performed by patient: Don/doff right sock, Don/doff left sock   Shoes - Performed by patient: Don/doff right shoe, Don/doff left shoe, Fasten right, Fasten left            Lower body assist Assist for lower body dressing: Touching or  steadying assistance (Pt > 75%)      Toileting Toileting   Toileting steps completed by patient: Adjust clothing prior to toileting, Performs perineal hygiene Toileting steps completed by helper: Adjust clothing prior to toileting Toileting Assistive Devices: Grab bar or rail  Toileting assist Assist level: Touching or steadying assistance (Pt.75%)   Transfers Chair/bed transfer   Chair/bed transfer method: Stand pivot Chair/bed transfer assist level: Moderate assist (Pt 50 - 74%/lift or lower) Chair/bed transfer assistive device: Patent attorneyWalker     Locomotion Ambulation     Max distance: 4720ft Assist level: Moderate assist (Pt 50 - 74%)   Wheelchair   Type: Manual Max wheelchair distance: 17350ft Assist Level: Supervision or verbal cues  Cognition Comprehension Comprehension assist level: Understands complex 90% of the time/cues 10% of the time  Expression Expression assist level: Expresses basic needs/ideas: With extra time/assistive device  Social Interaction Social Interaction assist level: Interacts appropriately 75 - 89% of the time - Needs redirection for appropriate language or to initiate interaction.  Problem Solving Problem solving assist level: Solves basic 50 - 74% of the time/requires cueing 25 - 49% of the time  Memory Memory assist level: Recognizes or recalls 50 - 74% of the time/requires cueing 25 - 49% of the time    Medical Problem List and Plan: 1. Debilitation secondary to respiratory failure/ARDS/multilobar pneumonia /encephalopathy -Continue CIR 2. DVT Prophylaxis/Anticoagulation: SCDs/ambulate. Monitor for any signs of DVT 3. Pain Management: Tylenol 4. Mood/autism/mentally impaired: Klonopin 0.5 mg daily at bedtime, Provigil 100 mg daily.  5. Neuropsych: This patient is not capable of making decisions on his own behalf. 6. Skin/Wound Care: Routine skin checks 7. Fluids/Electrolytes/Nutrition: Routine I/Os  8. Ventilatory dependent respiratory  failure/tracheostomy. Decannulation 04/25/2015 9. Acute kidney failure. Resolved. Patient did receive dialysis for a short time. Cr normalized. 10. Vancomycin resistant enterococci urinary tract infection. Treated. Continue contact precautions 11. Tachycardia/atrial fibrillation.   Continue Cardizem 60 mg every 8 hours,   Lopressor 25 mg twice a day, increased to 50 on 4/27  Will continue to monitor 12. History of sickle cell trait: Continue to monitor 13. Leukocytosis  WBCs 12.0 on 4/21  Will cont to monitor  14. Mild hypokalemia:  Potassium 3.4 on 4/27  LOS (Days) 2 A FACE TO FACE EVALUATION WAS PERFORMED  Ankit Karis Jubanil Patel 05/08/2015 9:11 AM

## 2015-05-09 ENCOUNTER — Inpatient Hospital Stay (HOSPITAL_COMMUNITY): Payer: Medicare Other | Admitting: Physical Therapy

## 2015-05-09 ENCOUNTER — Inpatient Hospital Stay (HOSPITAL_COMMUNITY): Payer: Medicare Other | Admitting: Occupational Therapy

## 2015-05-09 ENCOUNTER — Inpatient Hospital Stay (HOSPITAL_COMMUNITY): Payer: Medicare Other

## 2015-05-09 DIAGNOSIS — G9340 Encephalopathy, unspecified: Secondary | ICD-10-CM

## 2015-05-09 NOTE — Progress Notes (Signed)
[place] PHYSICAL MEDICINE & REHABILITATION     PROGRESS NOTE  Subjective/Complaints:  Patient lying in bed this morning with his eyes closed. He does not want to engage in conversation this morning.  ROS: Appears to deny CP, SOB, nausea, vomiting, diarrhea.  Objective: Vital Signs: Blood pressure 122/71, pulse 98, temperature 98.9 F (37.2 C), temperature source Oral, resp. rate 18, height 5\' 9"  (1.753 m), weight 88.905 kg (196 lb), SpO2 93 %. No results found.  Recent Labs  05/08/15 0550  WBC 12.0*  HGB 9.3*  HCT 27.4*  PLT 322    Recent Labs  05/08/15 0550  NA 142  K 3.4*  CL 107  GLUCOSE 99  BUN 7  CREATININE 1.10  CALCIUM 9.1   CBG (last 3)  No results for input(s): GLUCAP in the last 72 hours.  Wt Readings from Last 3 Encounters:  05/08/15 88.905 kg (196 lb)  04/07/15 104.8 kg (231 lb 0.7 oz)  03/07/15 102.694 kg (226 lb 6.4 oz)    Physical Exam:  BP 122/71 mmHg  Pulse 98  Temp(Src) 98.9 F (37.2 C) (Oral)  Resp 18  Ht 5\' 9"  (1.753 m)  Wt 88.905 kg (196 lb)  BMI 28.93 kg/m2  SpO2 93% Constitutional: NAD. Vital signs reviewed. Well-developed, well-nourished. He requires significant encouragement to participate HENT: Normocephalic. Atraumatic Eyes: Conjunctivae and EOM are normal.   Cardiovascular: + Tachycardic. Regular rhythm Respiratory: Effort normal. No respiratory distress.  GI: Soft. Bowel sounds are normal. He exhibits no distension.  Musculoskeletal: Left knee TTP. No edema, erythema, warmth.  Neurological: He is alert and? Oriented 2.  Patient will make limited eye contact with examiner.  Difficult to engage.  Motor: Previously:  Right upper extremity 5/5 proximal distal Left upper extremity: 4+/5 proximal to distal Right lower extremity: Hip flexion, knee extension 4/5, ankle dorsi/plantarflexion 5/5 Left lower extremity: Hip flexion, knee extension? 4/5, ankle dorsi/plantarflexion 5/5 Skin:  Trach site healed.  Psychiatric:   Very quiet. Hesitant to engage.   Assessment/Plan: 1. Functional deficits secondary to debility which require 3+ hours per day of interdisciplinary therapy in a comprehensive inpatient rehab setting. Physiatrist is providing close team supervision and 24 hour management of active medical problems listed below. Physiatrist and rehab team continue to assess barriers to discharge/monitor patient progress toward functional and medical goals.  Function:  Bathing Bathing position   Position: Wheelchair/chair at sink  Bathing parts Body parts bathed by patient: Right arm, Left arm, Chest, Abdomen, Front perineal area, Right upper leg, Left upper leg, Right lower leg, Left lower leg Body parts bathed by helper: Buttocks, Back  Bathing assist Assist Level: Touching or steadying assistance(Pt > 75%)      Upper Body Dressing/Undressing Upper body dressing   What is the patient wearing?: Pull over shirt/dress     Pull over shirt/dress - Perfomed by patient: Thread/unthread right sleeve, Thread/unthread left sleeve, Put head through opening, Pull shirt over trunk          Upper body assist Assist Level: Set up, Supervision or verbal cues   Set up : To obtain clothing/put away  Lower Body Dressing/Undressing Lower body dressing   What is the patient wearing?: Underwear, Pants, Socks, Shoes Underwear - Performed by patient: Thread/unthread right underwear leg, Thread/unthread left underwear leg Underwear - Performed by helper: Pull underwear up/down Pants- Performed by patient: Thread/unthread right pants leg, Thread/unthread left pants leg Pants- Performed by helper: Pull pants up/down     Socks - Performed by  patient: Don/doff right sock, Don/doff left sock   Shoes - Performed by patient: Don/doff right shoe, Don/doff left shoe, Fasten right, Fasten left            Lower body assist Assist for lower body dressing: Touching or steadying assistance (Pt > 75%)       Toileting Toileting   Toileting steps completed by patient: Adjust clothing prior to toileting, Performs perineal hygiene Toileting steps completed by helper: Adjust clothing prior to toileting Toileting Assistive Devices: Grab bar or rail  Toileting assist Assist level: Touching or steadying assistance (Pt.75%)   Transfers Chair/bed transfer   Chair/bed transfer method: Stand pivot Chair/bed transfer assist level: Moderate assist (Pt 50 - 74%/lift or lower) Chair/bed transfer assistive device: Armrests, Walker, Bedrails     Locomotion Ambulation     Max distance: 20 Assist level: Touching or steadying assistance (Pt > 75%)   Wheelchair   Type: Manual Max wheelchair distance: 150 Assist Level: Supervision or verbal cues  Cognition Comprehension Comprehension assist level: Understands basic 90% of the time/cues < 10% of the time  Expression Expression assist level: Expresses basic 90% of the time/requires cueing < 10% of the time.  Social Interaction Social Interaction assist level: Interacts appropriately 75 - 89% of the time - Needs redirection for appropriate language or to initiate interaction.  Problem Solving Problem solving assist level: Solves basic 50 - 74% of the time/requires cueing 25 - 49% of the time  Memory Memory assist level: Recognizes or recalls 75 - 89% of the time/requires cueing 10 - 24% of the time    Medical Problem List and Plan: 1. Debilitation secondary to respiratory failure/ARDS/multilobar pneumonia /encephalopathy -Continue CIR 2. DVT Prophylaxis/Anticoagulation: SCDs/ambulate. Monitor for any signs of DVT 3. Pain Management: Tylenol 4. Mood/autism/mentally impaired: Klonopin 0.5 mg daily at bedtime, Provigil 100 mg daily.  5. Neuropsych: This patient is not capable of making decisions on his own behalf. 6. Skin/Wound Care: Routine skin checks 7. Fluids/Electrolytes/Nutrition: Routine I/Os  8. Ventilatory dependent respiratory  failure/tracheostomy. Decannulation 04/25/2015 9. Acute kidney failure. Resolved. Patient did receive dialysis for a short time. Cr normalized. 10. Vancomycin resistant enterococci urinary tract infection. Treated. Continue contact precautions 11. Tachycardia/atrial fibrillation.   Continue Cardizem 60 mg every 8 hours,   Lopressor 25 mg twice a day, increased to 50 on 4/27  Will continue to monitor 12. History of sickle cell trait: Continue to monitor 13. Leukocytosis  WBCs 12.0 on 4/27  Will cont to monitor  14. Mild hypokalemia:  Potassium 3.4 on 4/27  Continue to monitor  LOS (Days) 3 A FACE TO FACE EVALUATION WAS PERFORMED  Haeleigh Streiff Karis Juba 05/09/2015 10:29 AM

## 2015-05-09 NOTE — Progress Notes (Signed)
Physical Therapy Session Note  Patient Details  Name: Juan Cameron MRN: [id number] Date of Birth: Nov 08, 1975  Today's Date: 05/09/2015 PT Individual Time: 1300-1400 PT Individual Time Calculation (min): 60 min   Short Term Goals: Week 1:  PT Short Term Goal 1 (Week 1): Pt will perform squat/stand pivot transfers with steady A. PT Short Term Goal 2 (Week 1): Pt will ambulate 50 ft with steady A & LRAD. PT Short Term Goal 3 (Week 1): Pt will transfer sit<>stand with steady A.  Skilled Therapeutic Interventions/Progress Updates:   patient received supine in bed and agreeable to PT. Bed mobility with supervision A for supine to sit. Squat pivot with mod A and heavy multimodal cueing for positiongin and sequencing of movement. WC mob for 150 using BLE propustion with 1 rest break and supervision A.  Seated therex: Hip flexion AROM x 10 BLE LAQ x 12 BLE  HS curl against level 2 tband x 12 BLE Hip abduction against level 2 tband x 12 BLE Hip extension against level 2 tband x 12 BLE  Kick ball sitting without UE support. 2 x 4 minutes.  2lb weighted bar ball tap 2x 3 minutes.   For all seated activity PT provided supervision A for improve safety as well as cues for improved erect posture and proper speed of movement to encourage increased endurance training with activities. Patient demonstrated moderate response to instruction from PT.   nustep level 4 x 8 minutes with 5 rest breaks. PT required to occasionally cue patient to reduce steps per minute in order to prevent increased fatigue.   Gait 74f x 2 with RW and min A from PT with cues for AD management in turns as well as increased BOS and increased step height. Only minor adjustments in gait with cues from PT. Throughout treatment patient performed sit<>stand and stand pivot transfers with Mod A and cues for sequencing and AD management.  [facility] and all other  needs met.     Therapy Documentation Precautions:  Precautions Precautions: Fall Restrictions Weight Bearing Restrictions: No Pain: Pain Assessment Pain Assessment: No/denies pain  See Function Navigator for Current Functional Status.   Therapy/Group: Individual Therapy  ALorie Phenix4/28/2017, 6:39 PM

## 2015-05-09 NOTE — Progress Notes (Signed)
Occupational Therapy Session Note  Patient Details  Name: Juan [facility] MRN: [id number] Date of Birth: 1975/11/22  Today's Date: 05/09/2015 OT Individual Time: 1433-1530 OT Individual Time Calculation (min): 57 min    Short Term Goals: Week 1:  OT Short Term Goal 1 (Week 1): STG=LTG due to LOS  Skilled Therapeutic Interventions/Progress Updates:    Pt completed wheelchair mobility down to the ortho gym with 2 rest breaks.  He used just his LEs to propel the wheelchair.  Pt reporting increased fatigue in his LEs during this task.  Once in the gym had pt engage in sit to stand transitions using BUEs for support progressing to only one UE.  He was able to stand for short intervals of 30 seconds to 1 min while engaged with Wii activity in the RUE.  Pt would immediately sit after this time frame.  Increased posterior lean in standing but pt able to complete sit to stand and standing for 12-14 intervals with only min guard assist.  Began with pt stabilizing the LUE on walker while engaged in activity.  Progressed to standing without any UE support.  Pt needing encouragement to try as he would voice being scared of falling.  Finished session with ball toss in sitting and then having pt propel himself back to the nurses station as he cannot be alone.  Pt left with safety belt in place.    Therapy Documentation Precautions:  Precautions Precautions: Fall Restrictions Weight Bearing Restrictions: No  Pain: Pain Assessment Pain Assessment: No/denies pain ADL: See Function Navigator for Current Functional Status.   Therapy/Group: Individual Therapy  Nitisha Civello OTR/L 05/09/2015, 4:26 PM

## 2015-05-09 NOTE — Progress Notes (Signed)
Occupational Therapy Session Note  Patient Details  Name: Juan [facility] MRN: [id number] Date of Birth: [birth year]  Today's Date: 05/09/2015 OT Individual Time: 1000-1100 OT Individual Time Calculation (min): 60 min    Short Term Goals: Week 1:  OT Short Term Goal 1 (Week 1): STG=LTG due to LOS  Skilled Therapeutic Interventions/Progress Updates: ADL-retraining (30 min) in w/c at sink with focus on sit<>stand, dynamic standing balance, improved endurance.   Pt received supine in bed, HOB elevated reporting fatigue and upset stomach.   RN alerted to possible skin irritation at his face; pt later stated that derm condition is chronic and he referred small bumps as "ticky-bumps," unknown etiology.   Pt agreed to bathe/dress at sink but was educated on use of tub bench in walk-in shower as beneficial to improve hygiene.   Pt observed demo of transfer but still related fear of falling as his reason to continue bathing at sink.   Pt progressed through BADL with intermittent vc to initiate and sequence.  With remaining time, pt completed BUE strengthening using TES (universal gym) with good effort although requiring moderate cueing to slow down and to perform exercises correctly.    Session concluded with 1 game of Wii bowling with pt standing [facility] of 10 frames.     Therapy Documentation Precautions:  Precautions Precautions: Fall Restrictions Weight Bearing Restrictions: No  Vital Signs: Therapy Vitals Temp: 98.9 F (37.2 C) Temp Source: Oral Pulse Rate: 98 Resp: 18 BP: 122/71 mmHg Patient Position (if appropriate): Sitting   Pain: No/denies pain    See Function Navigator for Current Functional Status.   Therapy/Group: Individual Therapy  Vallery Mcdade 05/09/2015, 12:24 PM

## 2015-05-09 NOTE — Progress Notes (Signed)
Physical Therapy Session Note  Patient Details  Name: Juan Cameron MRN: [id number] Date of Birth: [birth year]  Today's Date: 05/09/2015 PT Individual Time: 1130-1200 PT Individual Time Calculation (min): 30 min   Short Term Goals: Week 1:  PT Short Term Goal 1 (Week 1): Pt will perform squat/stand pivot transfers with steady A. PT Short Term Goal 2 (Week 1): Pt will ambulate 50 ft with steady A & LRAD. PT Short Term Goal 3 (Week 1): Pt will transfer sit<>stand with steady A.  Skilled Therapeutic Interventions/Progress Updates:    Pt received in w/c in nursing session with no c/o pain and agreeable to therapy session.  Session focus on attention and activity tolerance.  Pt propelled w/c to room to collect RW and then to [facility] gym, using BLEs and supervision with verbal cues for safe self selected speed of propulsion.  PT instructed pt in 3 trials of dynavision in standing with RW for improved endurance.  Pt performance as follows:  Trial 1: 50 lights, 2.12 sec reaction time, 106 seconds total time standing Trial 2: 28 lights, 3 sec reaction time, 84 seconds total time standing Trial 3: 88 lights, 1.52 sec reaction time, 134 seconds total standing time.   Pt returned to nurses station at end of session with QRB in place and needs met.    Therapy Documentation Precautions:  Precautions Precautions: Fall Restrictions Weight Bearing Restrictions: No   See Function Navigator for Current Functional Status.   Therapy/Group: Individual Therapy  Earnest Conroy Penven-Crew 05/09/2015, 12:22 PM

## 2015-05-10 NOTE — Progress Notes (Signed)
[facility] PHYSICAL MEDICINE & REHABILITATION     PROGRESS NOTE  Subjective/Complaints:  Pt states "I want to sleep"   ROS: Denies pain but otherwise does not cooperate with ROS  Objective: Vital Signs: Blood pressure 131/64, pulse 101, temperature 98.4 F (36.9 C), temperature source Oral, resp. rate 18, height 5\' 9"  (1.753 m), weight 88.905 kg (196 lb), SpO2 92 %. No results found.  Recent Labs  05/08/15 0550  WBC 12.0*  HGB 9.3*  HCT 27.4*  PLT 322    Recent Labs  05/08/15 0550  NA 142  K 3.4*  CL 107  GLUCOSE 99  BUN 7  CREATININE 1.10  CALCIUM 9.1   CBG (last 3)  No results for input(s): GLUCAP in the last 72 hours.  Wt Readings from Last 3 Encounters:  05/08/15 88.905 kg (196 lb)  04/07/15 104.8 kg (231 lb 0.7 oz)  03/07/15 102.694 kg (226 lb 6.4 oz)    Physical Exam:  BP 131/64 mmHg  Pulse 101  Temp(Src) 98.4 F (36.9 C) (Oral)  Resp 18  Ht 5\' 9"  (1.753 m)  Wt 88.905 kg (196 lb)  BMI 28.93 kg/m2  SpO2 92% Constitutional: NAD. Vital signs reviewed. Well-developed, well-nourished. He requires significant encouragement to participate HENT: Normocephalic. Atraumatic Eyes: Conjunctivae and EOM are normal.   Cardiovascular:. Regular rhythm noi murmur  Respiratory: Effort normal. No respiratory distress.  GI: Soft. Bowel sounds are normal. He exhibits no distension.  Musculoskeletal: refuses Neurological: He is alert and? Oriented 2.  Patient will make limited eye contact with examiner.  Difficult to engage.  Refusing MMT Skin:  Janina Mayorach site healed.  Psychiatric:  Very quiet. Hesitant to engage.   Assessment/Plan: 1. Functional deficits secondary to debility which require 3+ hours per day of interdisciplinary therapy in a comprehensive inpatient rehab setting. Physiatrist is providing close team supervision and 24 hour management of active medical problems listed below. Physiatrist and rehab team continue to assess barriers to discharge/monitor  patient progress toward functional and medical goals.  Function:  Bathing Bathing position   Position: Wheelchair/chair at sink  Bathing parts Body parts bathed by patient: Right arm, Left arm, Chest, Abdomen, Right upper leg, Left upper leg, Right lower leg, Left lower leg Body parts bathed by helper: Back  Bathing assist Assist Level: Touching or steadying assistance(Pt > 75%)      Upper Body Dressing/Undressing Upper body dressing   What is the patient wearing?: Pull over shirt/dress     Pull over shirt/dress - Perfomed by patient: Thread/unthread right sleeve, Thread/unthread left sleeve, Put head through opening, Pull shirt over trunk          Upper body assist Assist Level: Set up, Supervision or verbal cues   Set up : To obtain clothing/put away  Lower Body Dressing/Undressing Lower body dressing   What is the patient wearing?: Underwear, [facility] Gown Underwear - Performed by patient: Thread/unthread right underwear leg, Thread/unthread left underwear leg Underwear - Performed by helper: Pull underwear up/down Pants- Performed by patient: Thread/unthread right pants leg, Thread/unthread left pants leg, Pull pants up/down, Fasten/unfasten pants Pants- Performed by helper: Pull pants up/down     Socks - Performed by patient: Don/doff right sock, Don/doff left sock   Shoes - Performed by patient: Don/doff right shoe, Don/doff left shoe, Fasten right, Fasten left            Lower body assist Assist for lower body dressing: Touching or steadying assistance (Pt > 75%)  Toileting Toileting   Toileting steps completed by patient: Adjust clothing prior to toileting, Adjust clothing after toileting Toileting steps completed by helper: Adjust clothing prior to toileting Toileting Assistive Devices: Grab bar or rail  Toileting assist Assist level: Touching or steadying assistance (Pt.75%)   Transfers Chair/bed transfer   Chair/bed transfer method: Stand  pivot Chair/bed transfer assist level: Moderate assist (Pt 50 - 74%/lift or lower) Chair/bed transfer assistive device: Armrests, Patent attorney     Max distance: 25 Assist level: Touching or steadying assistance (Pt > 75%)   Wheelchair   Type: Manual Max wheelchair distance: 150 Assist Level: Supervision or verbal cues  Cognition Comprehension Comprehension assist level: Understands basic 90% of the time/cues < 10% of the time  Expression Expression assist level: Expresses basic 75 - 89% of the time/requires cueing 10 - 24% of the time. Needs helper to occlude trach/needs to repeat words., Expresses basic 90% of the time/requires cueing < 10% of the time.  Social Interaction Social Interaction assist level: Interacts appropriately 75 - 89% of the time - Needs redirection for appropriate language or to initiate interaction.  Problem Solving Problem solving assist level: Solves basic 50 - 74% of the time/requires cueing 25 - 49% of the time  Memory Memory assist level: Recognizes or recalls 75 - 89% of the time/requires cueing 10 - 24% of the time    Medical Problem List and Plan: 1. Debilitation secondary to respiratory failure/ARDS/multilobar pneumonia /encephalopathy -Continue CIR 2. DVT Prophylaxis/Anticoagulation: SCDs/ambulate. Monitor for any signs of DVT 3. Pain Management: Tylenol 4. Mood/autism/mentally impaired: Klonopin 0.5 mg daily at bedtime, Provigil 100 mg daily.  5. Neuropsych: This patient is not capable of making decisions on his own behalf. 6. Skin/Wound Care: Routine skin checks 7. Fluids/Electrolytes/Nutrition: Routine I/Os  8. Ventilatory dependent respiratory failure/tracheostomy. Decannulation 04/25/2015 9. Acute kidney failure. Resolved. Patient did receive dialysis for a short time. Cr normalized. 10. Vancomycin resistant enterococci urinary tract infection. Treated. Continue contact precautions 11. Tachycardia/atrial  fibrillation.   Continue Cardizem 60 mg every 8 hours,   Lopressor 25 mg twice a day, increased to 50 on 4/27  Will continue to monitor 12. History of sickle cell trait: Continue to monitor 13. Leukocytosis  WBCs 12.0 on 4/21  Will cont to monitor  14. Mild hypokalemia:  Potassium 3.4 on 4/27  LOS (Days) 4 A FACE TO FACE EVALUATION WAS PERFORMED  Erick Colace 05/10/2015 8:43 AM

## 2015-05-11 ENCOUNTER — Inpatient Hospital Stay (HOSPITAL_COMMUNITY): Payer: Medicare Other | Admitting: Occupational Therapy

## 2015-05-11 ENCOUNTER — Inpatient Hospital Stay (HOSPITAL_COMMUNITY): Payer: Medicare Other | Admitting: Physical Therapy

## 2015-05-11 LAB — GLUCOSE, CAPILLARY: GLUCOSE-CAPILLARY: 100 mg/dL — AB (ref 65–99)

## 2015-05-11 MED ORDER — METOPROLOL TARTRATE 25 MG PO TABS
75.0000 mg | ORAL_TABLET | Freq: Two times a day (BID) | ORAL | Status: DC
Start: 2015-05-11 — End: 2015-05-16
  Administered 2015-05-11 – 2015-05-16 (×11): 75 mg via ORAL
  Filled 2015-05-11 (×11): qty 1

## 2015-05-11 NOTE — Progress Notes (Signed)
Physical Therapy Session Note  Patient Details  Name: Juan Cameron MRN: [id number] Date of Birth: 04-May-1975  Today's Date: 05/11/2015 PT Individual Time: 1106-1206 and [id number] PT Individual Time Calculation (min): 60 min and 32 minutes  Short Term Goals: Week 1:  PT Short Term Goal 1 (Week 1): Pt will perform squat/stand pivot transfers with steady A. PT Short Term Goal 2 (Week 1): Pt will ambulate 50 ft with steady A & LRAD. PT Short Term Goal 3 (Week 1): Pt will transfer sit<>stand with steady A.  Skilled Therapeutic Interventions/Progress Updates:    Treatment 1: Pt received in w/c & agreeable to PT, pt denying c/o pain. Pt self propelled w/c 115 ft with BLE from room>gym. In gym pt's orthostatic BPs were assessed in sitting & standing (please see below). Pt able to complete sit<>stand transfers with supervision. Utilized cybex kinetron in sitting on 50 cm/sec with pt performing 1 minute bouts x 3 trials with rest breaks in between & encouragement from PT to complete each 1 minute trial. Pt noted LE's were "tired" after task & pt's behavior also changed as if something were hurting. Pt finally reported his head hurt & PT notified RN, vitals were BP = 109/61 mmHg, HR = 103 bpm, SpO2 = 95%. Gait training 70 ft + 30 ft with RW & steady A. Pt with intermittent scissoring gait & cuing to increase ambulation distances as well as to look up instead of at floor, but pt with more upright posture on this date. At end of session pt transported back to room & left in w/c with QRB in place & all needs within reach.   Treatment 2: Pt received in w/c with hand off from OT & pt agreeable to pt. Per faces scale pt reported 8/10 pain in BLE knees and PT notified RN. Pt self propelled w/c room>gym with BLE & in gym pt required maximum encouragement to attempt stair negotiation. Pt able to negotiate 8 steps (3 inches in height) with B rails & reciprocal pattern, but very narrow step width and almost scissors when  descending stairs. Pt with visible anxiety & reported being scare when descending stairs but only required steady/Min A for activity with seated rest break after. BP assessed & = 99/53 mmHg. Utilized nu-step on Level 2 x 5 minutes with encouragement to continue activity as pt tried to take multiple rest break. Pt completed stand pivot nu-step>w/c with steady A and propelled w/c back to room with BLE. At end of session pt left in room in w/c with QRB in place & all needs within reach.    Therapy Documentation Precautions:  Precautions Precautions: Fall Restrictions Weight Bearing Restrictions: No Vital Signs: Therapy Vitals Pulse Rate: 100 BP: 116/66 mmHg Patient Position (if appropriate): Orthostatic Vitals  Sitting BP = 110/68 mmHg HR = 100 bpm Standing BP = 116/66 mmHg Pain: Pain Assessment Pain Assessment: No/denies pain    See Function Navigator for Current Functional Status.   Therapy/Group: Individual Therapy  Sandi MariscalVictoria M Keita Demarco 05/11/2015, 11:34 AM

## 2015-05-11 NOTE — Progress Notes (Signed)
Occupational Therapy Session Note  Patient Details  Name: Juan Cameron MRN: [id number] Date of Birth: 07/22/1975  Today's Date: 05/11/2015     Skilled Therapeutic Interventions/Progress Updates: patient stated his knees were hurting too badly to complete afternoon therapy session.   He missed 60 minutes     Therapy Documentation Precautions:  Precautions Precautions: Fall Restrictions Weight Bearing Restrictions: No General: General OT Amount of Missed Time: 60 Minutes (60 mi)  Pain: bilateral knees but patient unable to rate.  RN was alerted of patient regquest for pain meds    See Function Navigator for Current Functional Status.   Therapy/Group: Individual Therapy  Bud Faceickett, Gregroy Dombkowski [facility] 05/11/2015, 5:23 PM

## 2015-05-11 NOTE — Progress Notes (Signed)
At end of shift, patient reported sore throat. Temps were normal during day. Reported to oncoming RN for assessment.

## 2015-05-11 NOTE — Progress Notes (Signed)
Occupational Therapy Session Note  Patient Details  Name: Juan RipperRobert Pawelski MRN: [id number] Date of Birth: 1975/11/16  Today's Date: 05/11/2015 OT Individual Time:  -  1400-1430   (30 min)      Short Term Goals: Week 1:  OT Short Term Goal 1 (Week 1): STG=LTG due to LOS     Skilled Therapeutic Interventions/Progress Updates:    OT addressed basic ADLtraining at sink level in wc.  Addressed functional transfers, standing balance, attention. Pt engaged in standing at the sink for 2-3 min shile brushing teeth and bathing ,and dressing.  He was left in wc with next therapist.   Therapy Documentation Precautions:  Precautions Precautions: Fall Restrictions Weight Bearing Restrictions: No    Vital Signs: Therapy Vitals Temp: 98.9 F (37.2 C) Temp Source: Oral Pulse Rate: 81 BP: (!) 99/53 mmHg Patient Position (if appropriate): Sitting Oxygen Therapy SpO2: 100 % O2 Device: Not Delivered Pain:   Knee pain -- no number given   :         See Function Navigator for Current Functional Status.   Therapy/Group: Individual Therapy  Humberto Sealsdwards, Tishawn Friedhoff J 05/11/2015, 4:31 PM

## 2015-05-11 NOTE — Progress Notes (Signed)
[facility] PHYSICAL MEDICINE & REHABILITATION     PROGRESS NOTE  Subjective/Complaints:  Want to sleep, doesn't respond to question sbut is awake   ROS: Denies pain but otherwise does not cooperate with ROS  Objective: Vital Signs: Blood pressure 114/65, pulse 110, temperature 98 F (36.7 C), temperature source Oral, resp. rate 18, height  (1.753 m), weight 88.905 kg (196 lb), SpO2 95 %. No results found. No results for input(s): WBC, HGB, HCT, PLT in the last 72 hours. No results for input(s): NA, K, CL, GLUCOSE, BUN, CREATININE, CALCIUM in the last 72 hours.  Invalid input(s): CO CBG (last 3)  No results for input(s): GLUCAP in the last 72 hours.  Wt Readings from Last 3 Encounters:  05/08/15 88.905 kg (196 lb)  04/07/15 104.8 kg (231 lb 0.7 oz)  03/07/15 102.694 kg (226 lb 6.4 oz)    Physical Exam:  BP 114/65 mmHg  Pulse 110  Temp(Src) 98 F (36.7 C) (Oral)  Resp 18  Ht  (1.753 m)  Wt 88.905 kg (196 lb)  BMI 28.93 kg/m2  SpO2 95% Constitutional: NAD. Vital signs reviewed. Well-developed, well-nourished. He requires significant encouragement to participate HENT: Normocephalic. Atraumatic Eyes: Conjunctivae and EOM are normal.   Cardiovascular:. Regular rhythm noi murmur  Respiratory: Effort normal. No respiratory distress.  GI: Soft. Bowel sounds are normal. He exhibits no distension.  Musculoskeletal: refuses Neurological: He is alert and? Oriented 2.  Patient will make limited eye contact with examiner.  Difficult to engage.  Refusing MMT Skin:  Janina Mayo site healed.  Psychiatric:  . Turns head to opposite side and closes eyes  Assessment/Plan: 1. Functional deficits secondary to debility which require 3+ hours per day of interdisciplinary therapy in a comprehensive inpatient rehab setting. Physiatrist is providing close team supervision and 24 hour management of active medical problems listed below. Physiatrist and rehab team continue to assess  barriers to discharge/monitor patient progress toward functional and medical goals.  Function:  Bathing Bathing position   Position: Wheelchair/chair at sink  Bathing parts Body parts bathed by patient: Right arm, Left arm, Chest, Abdomen, Right upper leg, Left upper leg, Right lower leg, Left lower leg Body parts bathed by helper: Back  Bathing assist Assist Level: Touching or steadying assistance(Pt > 75%)      Upper Body Dressing/Undressing Upper body dressing   What is the patient wearing?: Pull over shirt/dress     Pull over shirt/dress - Perfomed by patient: Thread/unthread right sleeve, Thread/unthread left sleeve, Put head through opening, Pull shirt over trunk          Upper body assist Assist Level: Set up, Supervision or verbal cues   Set up : To obtain clothing/put away  Lower Body Dressing/Undressing Lower body dressing   What is the patient wearing?: Underwear, [facility] - Performed by patient: Thread/unthread right underwear leg, Thread/unthread left underwear leg Underwear - Performed by helper: Pull underwear up/down Pants- Performed by patient: Thread/unthread right pants leg, Thread/unthread left pants leg, Pull pants up/down, Fasten/unfasten pants Pants- Performed by helper: Pull pants up/down     Socks - Performed by patient: Don/doff right sock, Don/doff left sock   Shoes - Performed by patient: Don/doff right shoe, Don/doff left shoe, Fasten right, Fasten left            Lower body assist Assist for lower body dressing: Touching or steadying assistance (Pt > 75%)      Toileting Toileting   Toileting steps completed by  patient: Adjust clothing prior to toileting, Adjust clothing after toileting Toileting steps completed by helper: Adjust clothing prior to toileting Toileting Assistive Devices: Grab bar or rail  Toileting assist Assist level: Touching or steadying assistance (Pt.75%)   Transfers Chair/bed transfer   Chair/bed  transfer method: Stand pivot Chair/bed transfer assist level: Moderate assist (Pt 50 - 74%/lift or lower) Chair/bed transfer assistive device: Armrests, Patent attorneyWalker     Locomotion Ambulation     Max distance: 25 Assist level: Touching or steadying assistance (Pt > 75%)   Wheelchair   Type: Manual Max wheelchair distance: 150 Assist Level: Supervision or verbal cues  Cognition Comprehension Comprehension assist level: Understands complex 90% of the time/cues 10% of the time  Expression Expression assist level: Expresses basic 75 - 89% of the time/requires cueing 10 - 24% of the time. Needs helper to occlude trach/needs to repeat words.  Social Interaction Social Interaction assist level: Interacts appropriately 75 - 89% of the time - Needs redirection for appropriate language or to initiate interaction.  Problem Solving Problem solving assist level: Solves basic 50 - 74% of the time/requires cueing 25 - 49% of the time  Memory Memory assist level: Recognizes or recalls 75 - 89% of the time/requires cueing 10 - 24% of the time    Medical Problem List and Plan: 1. Debilitation secondary to respiratory failure/ARDS/multilobar pneumonia /encephalopathy -Continue CIR, ? Cooperation with therapy 2. DVT Prophylaxis/Anticoagulation: SCDs/ambulate. Monitor for any signs of DVT 3. Pain Management: Tylenol 4. Mood/autism/mentally impaired: Klonopin 0.5 mg daily at bedtime, Provigil 100 mg daily.  5. Neuropsych: This patient is not capable of making decisions on his own behalf. 6. Skin/Wound Care: Routine skin checks 7. Fluids/Electrolytes/Nutrition: Routine I/Os  8. Ventilatory dependent respiratory failure/tracheostomy. Decannulation 04/25/2015 9. Acute kidney failure. Resolved. Patient did receive dialysis for a short time. Cr normalized. 10. Vancomycin resistant enterococci urinary tract infection. Treated. Continue contact precautions 11. Tachycardia/atrial fibrillation.    Continue Cardizem 60 mg every 8 hours,   Lopressor 25 mg twice a day, increased to 50 on 4/27, still tachy will increase and monitor for orthostasis   Filed Vitals:   05/10/15 2028 05/11/15 0505  BP:  114/65  Pulse: 119 110  Temp:  98 F (36.7 C)  Resp:     12. History of sickle cell trait: Continue to monitor 13. Leukocytosis  WBCs 12.0 on 4/21  Will recheck in am  14. Mild hypokalemia:  Potassium 3.4 on 4/27, recheck in am  LOS (Days) 5 A FACE TO FACE EVALUATION WAS PERFORMED  Erick ColaceKIRSTEINS,Sharlett Lienemann E 05/11/2015 8:51 AM

## 2015-05-12 ENCOUNTER — Inpatient Hospital Stay (HOSPITAL_COMMUNITY): Payer: Medicare Other

## 2015-05-12 ENCOUNTER — Inpatient Hospital Stay (HOSPITAL_COMMUNITY): Payer: Medicare Other | Admitting: Physical Therapy

## 2015-05-12 ENCOUNTER — Inpatient Hospital Stay (HOSPITAL_COMMUNITY): Payer: Medicare Other | Admitting: Occupational Therapy

## 2015-05-12 LAB — CBC WITH DIFFERENTIAL/PLATELET
BASOS PCT: 0 %
Basophils Absolute: 0 10*3/uL (ref 0.0–0.1)
EOS PCT: 6 %
Eosinophils Absolute: 0.7 10*3/uL (ref 0.0–0.7)
HEMATOCRIT: 29.8 % — AB (ref 39.0–52.0)
HEMOGLOBIN: 9.6 g/dL — AB (ref 13.0–17.0)
LYMPHS PCT: 18 %
Lymphs Abs: 2.2 10*3/uL (ref 0.7–4.0)
MCH: 26.5 pg (ref 26.0–34.0)
MCHC: 32.2 g/dL (ref 30.0–36.0)
MCV: 82.3 fL (ref 78.0–100.0)
MONO ABS: 1 10*3/uL (ref 0.1–1.0)
MONOS PCT: 8 %
NEUTROS PCT: 68 %
Neutro Abs: 8.5 10*3/uL — ABNORMAL HIGH (ref 1.7–7.7)
Platelets: 312 10*3/uL (ref 150–400)
RBC: 3.62 MIL/uL — AB (ref 4.22–5.81)
RDW: 16.2 % — AB (ref 11.5–15.5)
WBC: 12.4 10*3/uL — AB (ref 4.0–10.5)

## 2015-05-12 LAB — BASIC METABOLIC PANEL
ANION GAP: 11 (ref 5–15)
BUN: 10 mg/dL (ref 6–20)
CO2: 23 mmol/L (ref 22–32)
Calcium: 9.2 mg/dL (ref 8.9–10.3)
Chloride: 108 mmol/L (ref 101–111)
Creatinine, Ser: 1.14 mg/dL (ref 0.61–1.24)
GFR calc non Af Amer: >60 mL/min (ref >60)
Glucose, Bld: 105 mg/dL — ABNORMAL HIGH (ref 65–99)
POTASSIUM: 4.1 mmol/L (ref 3.5–5.1)
SODIUM: 142 mmol/L (ref 135–145)

## 2015-05-12 MED ORDER — GUAIFENESIN-DM 100-10 MG/5ML PO SYRP
10.0000 mL | ORAL_SOLUTION | ORAL | Status: DC | PRN
  Administered 2015-05-13 – 2015-05-16 (×6): 10 mL via ORAL
  Filled 2015-05-12 (×6): qty 10

## 2015-05-12 NOTE — Progress Notes (Signed)
Occupational Therapy Session Note  Patient Details  Name: Juan Cameron MRN: [id number] Date of Birth: 1976/01/10  Today's Date: 05/12/2015 OT Individual Time: 1300-1400 OT Individual Time Calculation (min): 60 min    Short Term Goals: Week 1:  OT Short Term Goal 1 (Week 1): STG=LTG due to LOS  Skilled Therapeutic Interventions/Progress Updates:  Upon entering the room, pt seated in wheelchair with quick release belt donned throughout session. Pt declined ambulation and transfers this session but agreeable to therapeutic exercise. Pt with no c/ pain but points to bilateral LE's and states, "weak". Pt engaged in 3 sets of 15 B elbow and shoulder exercises within all planes with use of 4 lbs dowel rod. Pt needing multiple rest breaks with verbal and tactile cues for proper technique. Call bell and all needed items within reach upon exiting the room.   Therapy Documentation Precautions:  Precautions Precautions: Fall Restrictions Weight Bearing Restrictions: No   Pain: Pain Assessment Pain Assessment: No/denies pain  See Function Navigator for Current Functional Status.   Therapy/Group: Individual Therapy  Lowella Gripittman, Nasim Habeeb L 05/12/2015, 3:12 PM

## 2015-05-12 NOTE — Progress Notes (Signed)
Pt and family complaining of a dry nagging cough with that is keeping him up at night. Pam Love PA notified and gave an order for Robitussin DM 10 ML Q4 PRN for cough. Juan Cameron, Denver Harder A, RN

## 2015-05-12 NOTE — Progress Notes (Signed)
Physical Therapy Session Note  Patient Details  Name: Juan Cameron MRN: [id number] Date of Birth: 24-Jul-1975  Today's Date: 05/12/2015 PT Individual Time: 1400-1545 PT Individual Time Calculation (min): 105 min  Pt seen for 60 minutes scheduled on this date + 45 minutes makeup time from yesterday (05/11/15)  Short Term Goals: Week 1:  PT Short Term Goal 1 (Week 1): Pt will perform squat/stand pivot transfers with steady A. PT Short Term Goal 2 (Week 1): Pt will ambulate 50 ft with steady A & LRAD. PT Short Term Goal 3 (Week 1): Pt will transfer sit<>stand with steady A.  Skilled Therapeutic Interventions/Progress Updates:    Pt received in w/c & agreeable to PT, denying c/o pain. Pt able to set up w/c by bed but would benefit from getting w/c closer to bed. Pt performed stand pivot w/c>bed with supervision. Pt able to complete all bed mobility (rolling L<>R and supine<>sit) without rails & supervision. Pt rolled L and very close to edge of bed; PT instructed pt to scoot over in bed before rolling L due to narrow space in hospital bed but pt unable to sequence this even with multiple cues. At one time pt rolled to prone when PT instructed pt to roll on back. Pt transferred back to w/c & self propelled himself room>gym with BLE and completed stand pivot to nu-step. Pt performed level 5 x 15 minutes with maximum cuing as pt wanted to take rest breaks every minute or so. PT educated pt to slow down activity instead of stopping & starting with activity focusing on endurance training, but still required continued cuing to participate. Pt negotiated stairs for BLE strengthening & pt able to negotiate 8 steps (3 inch height) with B rails, supervision & reciprocal pattern. Pt then able to negotiate 4 steps (6 inch height) with B rails and supervision but with decreased eccentric control & unsteadiness when descending stairs. Pt reported "a lot" of knee pain & PT notified RN, who later admitted pain medication  during session. Encouraged pt to try some walking but pt reported fatigue & weakness. Educated pt on importance of PT to progress strength in BLE. Pt also reported staying up at night to watch tv with therapist educating pt on need to sleep at night so pt will have enough energy during day to participate in treatment. After maximum encouragement pt attempted ambulating but only able to do so x 10 ft with RW due to pt requesting to return to w/c due to weakness. Pt willing to participate in bowling on Wii with motivation from therapist & pt able to tolerate standing x 20 minute but with seated rest breaks every 5 minutes 2/2BLE weakness. While bowling pt able to maintain standing with 1UE support or without UE support without any LOB. Pt then willing to ambulate back towards room and gait training x 30 ft completed with RW & steady A. Pt with more upright posture on this date but decreased step length BLE. Pt the self propelled w/c back to room with BLE and at end of session pt left in w/c with all needs within reach.   Throughout session pt required multiple extended rest breaks 2/2 fatigue & being "weak" per patient. Pt also required maximum encouragement to focus on and continue tasks as pt easily distracted at times.  Therapy Documentation Precautions:  Precautions Precautions: Fall Restrictions Weight Bearing Restrictions: No Pain: Pain Assessment Pain Assessment: No/denies pain Faces Pain Scale: Hurts a little bit   See Function Navigator  for Current Functional Status.   Therapy/Group: Individual Therapy  Sandi Mariscal 05/12/2015, 2:28 PM

## 2015-05-12 NOTE — Progress Notes (Signed)
Occupational Therapy Session Note  Patient Details  Name: Juan Cameron MRN: [id number] Date of Birth: 06-22-1975  Today's Date: 05/12/2015 OT Individual Time: 5621-30860930-1045 OT Individual Time Calculation (min): 75 min    Short Term Goals: Week 1:  OT Short Term Goal 1 (Week 1): STG=LTG due to LOS  Skilled Therapeutic Interventions/Progress Updates: ADL-retraining at sink side with focus on improved participation/behavior management/improved awareness, transfer (bed <>w/c),  sit<>stand, dynamic standing balance.   Pt received supine in bed watching home theatre device and enjoying his favorite show: Power Rangers.   OT noted pt's show was at end of performance therefore TV was turned off to focus on BADL. Pt became mildly agitated with entertainment extinguished and rejected cues to rise from bed bathe and dress as per schedule.   Pt rolled away and avoided all eye-contact with therapist until behavior was reprimanded as appropriate for equivalent age (approx 226-38 y/o).   Pt required extra time to process instructions due to pouting but completed bed mobility, transfer, and seated bathing/dressing at sink with mod vc to advance and for thoroughness.   Pt able to stand and pull up his own pants after finishing toileting using urinal.   Pt continues to reject use of shower and toilet with Clinical research associatewriter.  Pt completes all ADL with minimal thoroughness and request Wii game activity until end of session; pt rejects/ignores cues to stand during game play he remained seated in w/c through Wii [employer] game.   Pt escorted back to his room at end of session; QRB attached.     Therapy Documentation Precautions:  Precautions Precautions: Fall Restrictions Weight Bearing Restrictions: No General:   Vital Signs:  Pain: Pain Assessment Pain Assessment: No/denies pain  ADL:   Exercises:   Other Treatments:    See Function Navigator for Current Functional Status.   Therapy/Group: Individual  Therapy  Juan Cameron 05/12/2015, 2:15 PM

## 2015-05-12 NOTE — Progress Notes (Signed)
[facility]     PROGRESS NOTE  Subjective/Complaints:  Patient seen sitting up in bed watching WWE. He is not interested in engaging.   ROS: Denies pain but otherwise does not cooperate with ROS  Objective: Vital Signs: Blood pressure 119/63, pulse 104, temperature 98.9 F (37.2 C), temperature source Oral, resp. rate 19, height 5\' 9"  (1.753 m), weight 88.905 kg (196 lb), SpO2 96 %. No results found. No results for input(s): WBC, HGB, HCT, PLT in the last 72 hours. No results for input(s): NA, K, CL, GLUCOSE, BUN, CREATININE, CALCIUM in the last 72 hours.  Invalid input(s): CO CBG (last 3)   Recent Labs  05/11/15 1205  GLUCAP 100*    Wt Readings from Last 3 Encounters:  05/08/15 88.905 kg (196 lb)  04/07/15 104.8 kg (231 lb 0.7 oz)  03/07/15 102.694 kg (226 lb 6.4 oz)    Physical Exam:  BP 119/63 mmHg  Pulse 104  Temp(Src) 98.9 F (37.2 C) (Oral)  Resp 19  Ht 5\' 9"  (1.753 m)  Wt 88.905 kg (196 lb)  BMI 28.93 kg/m2  SpO2 96% Constitutional: NAD. Vital signs reviewed. Well-developed, well-nourished. He requires significant encouragement to participate HENT: Normocephalic. Atraumatic Eyes: Conjunctivae and EOM are normal.   Cardiovascular: Regular rate and rhythm no murmur   Respiratory: Effort normal. No respiratory distress.  GI: Soft. Bowel sounds are normal. He exhibits no distension.  Musculoskeletal: refuses Neurological: He is alert and? Oriented 2.  Patient will make limited eye contact with examiner.  Difficult to engage.  Refusing MMT Previously:  Right upper extremity 5/5 proximal distal Left upper extremity: 4+/5 proximal to distal Right lower extremity: Hip flexion, knee extension 4/5, ankle dorsi/plantarflexion 5/5 Left lower extremity: Hip flexion, knee extension? 4/5, ankle dorsi/plantarflexion 5/5 Skin:  Trach site healed.  Psychiatric:  Very quiet. Hesitant to engage.  Assessment/Plan: 1. Functional  deficits secondary to debility which require 3+ hours per day of interdisciplinary therapy in a comprehensive inpatient rehab setting. Physiatrist is providing close team supervision and 24 hour management of active medical problems listed below. Physiatrist and rehab team continue to assess barriers to discharge/monitor patient progress toward functional and medical goals.  Function:  Bathing Bathing position   Position: Wheelchair/chair at sink  Bathing parts Body parts bathed by patient: Right arm, Left arm, Chest, Abdomen, Right upper leg, Left upper leg, Right lower leg, Left lower leg, Front perineal area Body parts bathed by helper: Back, Buttocks  Bathing assist Assist Level: Touching or steadying assistance(Pt > 75%)      Upper Body Dressing/Undressing Upper body dressing   What is the patient wearing?: Pull over shirt/dress     Pull over shirt/dress - Perfomed by patient: Thread/unthread right sleeve, Thread/unthread left sleeve, Put head through opening, Pull shirt over trunk          Upper body assist Assist Level: Set up, Supervision or verbal cues   Set up : To obtain clothing/put away  Lower Body Dressing/Undressing Lower body dressing   What is the patient wearing?: Pants, Non-skid slipper socks Underwear - Performed by patient: Thread/unthread right underwear leg, Thread/unthread left underwear leg Underwear - Performed by helper: Pull underwear up/down Pants- Performed by patient: Thread/unthread right pants leg, Thread/unthread left pants leg, Pull pants up/down, Fasten/unfasten pants Pants- Performed by helper: Pull pants up/down Non-skid slipper socks- Performed by patient: Don/doff left sock, Don/doff right sock   Socks - Performed by patient: Don/doff right sock, Don/doff left sock  Shoes - Performed by patient: Don/doff right shoe, Don/doff left shoe, Fasten right, Fasten left            Lower body assist Assist for lower body dressing: Touching or  steadying assistance (Pt > 75%)      Toileting Toileting Toileting activity did not occur: Refused Toileting steps completed by patient: Adjust clothing prior to toileting, Adjust clothing after toileting Toileting steps completed by helper: Adjust clothing prior to toileting Toileting Assistive Devices: Grab bar or rail  Toileting assist Assist level: Touching or steadying assistance (Pt.75%)   Transfers Chair/bed transfer   Chair/bed transfer method: Stand pivot Chair/bed transfer assist level: Supervision or verbal cues Chair/bed transfer assistive device: Armrests     Locomotion Ambulation     Max distance: 70 Assist level: Touching or steadying assistance (Pt > 75%)   Wheelchair   Type: Manual Max wheelchair distance: 150 Assist Level: Supervision or verbal cues  Cognition Comprehension Comprehension assist level: Understands complex 90% of the time/cues 10% of the time  Expression Expression assist level: Expresses basic 75 - 89% of the time/requires cueing 10 - 24% of the time. Needs helper to occlude trach/needs to repeat words.  Social Interaction Social Interaction assist level: Interacts appropriately 75 - 89% of the time - Needs redirection for appropriate language or to initiate interaction.  Problem Solving Problem solving assist level: Solves basic 50 - 74% of the time/requires cueing 25 - 49% of the time  Memory Memory assist level: Recognizes or recalls 75 - 89% of the time/requires cueing 10 - 24% of the time    Medical Problem List and Plan: 1. Debilitation secondary to respiratory failure/ARDS/multilobar pneumonia /encephalopathy -Continue CIR 2. DVT Prophylaxis/Anticoagulation: SCDs/ambulate. Monitor for any signs of DVT 3. Pain Management: Tylenol 4. Mood/autism/mentally impaired: Klonopin 0.5 mg daily at bedtime, Provigil 100 mg daily.  5. Neuropsych: This patient is not capable of making decisions on his own behalf. 6. Skin/Wound Care:  Routine skin checks 7. Fluids/Electrolytes/Nutrition: Routine I/Os  8. Ventilatory dependent respiratory failure/tracheostomy. Decannulation 04/25/2015 9. Acute kidney failure. Resolved. Patient did receive dialysis for a short time. Cr normalized. 10. Vancomycin resistant enterococci urinary tract infection. Treated. Continue contact precautions 11. Tachycardia/atrial fibrillation.   Continue Cardizem 60 mg every 8 hours,   Lopressor 25 mg twice a day, increased to 50 on 4/27, increased to 75 on 4/30  Will continue to monitor 12. History of sickle cell trait: Continue to monitor 13. Leukocytosis  WBCs 12.0 on 4/27  Labs pending  14. Mild hypokalemia:  Potassium 3.4 on 4/27, labs pending  LOS (Days) 6 A FACE TO FACE EVALUATION WAS PERFORMED  Akansha Wyche Karis Juba 05/12/2015 9:07 AM

## 2015-05-13 ENCOUNTER — Inpatient Hospital Stay (HOSPITAL_COMMUNITY): Payer: Medicare Other | Admitting: Physical Therapy

## 2015-05-13 ENCOUNTER — Inpatient Hospital Stay (HOSPITAL_COMMUNITY): Payer: Medicare Other

## 2015-05-13 DIAGNOSIS — R05 Cough: Secondary | ICD-10-CM

## 2015-05-13 DIAGNOSIS — R059 Cough, unspecified: Secondary | ICD-10-CM | POA: Insufficient documentation

## 2015-05-13 DIAGNOSIS — M25561 Pain in right knee: Secondary | ICD-10-CM

## 2015-05-13 LAB — URINALYSIS, ROUTINE W REFLEX MICROSCOPIC
BILIRUBIN URINE: NEGATIVE
GLUCOSE, UA: NEGATIVE mg/dL
HGB URINE DIPSTICK: NEGATIVE
KETONES UR: NEGATIVE mg/dL
Leukocytes, UA: NEGATIVE
Nitrite: NEGATIVE
PROTEIN: NEGATIVE mg/dL
Specific Gravity, Urine: 1.017 (ref 1.005–1.030)
pH: 5.5 (ref 5.0–8.0)

## 2015-05-13 LAB — PATHOLOGIST SMEAR REVIEW

## 2015-05-13 MED ORDER — LEVOFLOXACIN 500 MG PO TABS
500.0000 mg | ORAL_TABLET | Freq: Every day | ORAL | Status: DC
  Administered 2015-05-13 – 2015-05-14 (×2): 500 mg via ORAL
  Filled 2015-05-13 (×2): qty 1

## 2015-05-13 MED ORDER — HYDROCORTISONE 1 % EX CREA
TOPICAL_CREAM | Freq: Two times a day (BID) | CUTANEOUS | Status: DC
  Administered 2015-05-13 – 2015-05-16 (×5): via TOPICAL
  Filled 2015-05-13: qty 28

## 2015-05-13 MED ORDER — MUSCLE RUB 10-15 % EX CREA
TOPICAL_CREAM | CUTANEOUS | Status: DC | PRN
  Administered 2015-05-13: 15:00:00 via TOPICAL
  Filled 2015-05-13: qty 85

## 2015-05-13 MED ORDER — MODAFINIL 100 MG PO TABS
50.0000 mg | ORAL_TABLET | Freq: Every day | ORAL | Status: DC
  Administered 2015-05-14 – 2015-05-16 (×3): 50 mg via ORAL
  Filled 2015-05-13 (×3): qty 1

## 2015-05-13 MED ORDER — IPRATROPIUM-ALBUTEROL 0.5-2.5 (3) MG/3ML IN SOLN
3.0000 mL | RESPIRATORY_TRACT | Status: DC | PRN
  Administered 2015-05-13 – 2015-05-16 (×2): 3 mL via RESPIRATORY_TRACT
  Filled 2015-05-13 (×2): qty 3

## 2015-05-13 NOTE — Progress Notes (Signed)
Charge RN talking to patient sister, [name] (patient caregiver, legal guardian), about her concerns.  She would like speech to work with patient for appropriate swallowing techniques, especially concerning medications.  Sister mentioned pills were found in patient's mouth later in the day, and patient is known for not fully swallowing pills or spitting them out if they are not crushed.  She would like to be notified of any changes in patient's care, whether it be medications, changes to oxygen, labs, etc., as she is the patient's caregiver and can help the hospital care team in giving the best possible care to the patient.  Charge RN answered sister's questions. Charge RN talked with primary RN for this shift and updated about the conversation. Primary RN to continue monitoring patient status.

## 2015-05-13 NOTE — Progress Notes (Addendum)
Patient complaining of mild sore throat when swallowing esp at night and in am. Therapists noting low 02 sats during activity. Notified MD, orders given for chest x-ray. Followed up with PA, orders given for additional tests and meds. Patient able to participate in therapy, in no acute distress. Will continue to monitor and handoff to oncoming RN.  Patient has low O2 sats on spot check, per family request and PA order initiated continuous monitoring. Orders for PRN O2 given to keep between 88 and 95. Reported to oncoming RN.

## 2015-05-13 NOTE — Progress Notes (Signed)
Physical Therapy Session Note  Patient Details  Name: Juan Cameron MRN: [id number] Date of Birth: [birth year]  Today's Date: 05/13/2015 PT Individual Time: 1478-2956 and 1418-1500 PT Individual Time Calculation (min): 72 min and 42 minutes   Short Term Goals: Week 1:  PT Short Term Goal 1 (Week 1): Pt will perform squat/stand pivot transfers with steady A. PT Short Term Goal 2 (Week 1): Pt will ambulate 50 ft with steady A & LRAD. PT Short Term Goal 3 (Week 1): Pt will transfer sit<>stand with steady A.  Skilled Therapeutic Interventions/Progress Updates:    Treatment 1: Pt received in bed & agreeable to PT, denying c/o pain. Pt's Godmother, Marjean Donna, present during first half of session inquiring about patient with PT educating her, while patient present, on pt's low motivation yesterday. Marjean Donna provided encouragement & education to patient on need to participate in therapy in order to get to go home & pt very receptive. Pt able to don new shirt & thread pants on BLE while sitting EOB & transferred to standing & completed donning pants with supervision A. Gait training completed x 40 ft + 80 ft + 175 ft with RW & steady A. Pt with decreased step length BLE, and pt able to self correct 50% of the time with verbal cuing from PT to increase step length. Pt did require sitting rest breaks during gait training 2/2 fatigue, as well as encouragement to participate after pt's godmother left. Pt encouraged to transfer to standing & perform BLE mini squats with BUE support, focusing on eccentric control with quads. Pt required maximum encouragement, multimodal cuing & demonstration for proper technique. Pt standing with eyes closed & decreased balance and when PT asked pt what was bothering him he stated "I don't know". Pt's BP assessed in standing = 82/48 mmHg & oxygen readings via probe on ear ranged from 73-93%. Pt returned to w/c & PT educated RN on pt's low BP with standing, decrease in alertness, as well as  inconsistent SpO2 readings. Pt returned to room and set up w/c with verbal cuing for positioning of w/c by bed. Pt able to transfer into bed with supervision A & BP in supine = 97/49 mmHg. Pt left in bed with all needs within reach, bed alarm set & x-ray tech present. During session PT also asked CSW to contact pt's sister for her to come in for family training.  Treatment 2: Pt received in w/c & agreeable to PT. Pt self propelled w/c with BLE x 120 ft to gym. At rest pt's SpO2 readings on hand = 81-83% but on L ear SpO2 = 93-100%. Pt negotiated 4 steps with B rails & supervision with intermittent reciprocal pattern. After task pt's SpO2 = 88-95% on ear on room air, BP =111/54, HR = 98 bpm. After rest break pt negotiated stairs a 2nd time but required Min A due to unsteadiness and during activity pt's eyes would close and PT had to tell pt to open eyes. Pt returned to sitting in w/c & SpO2 on ear = 88%, BP = 101/62 mmHg, HR = 104 bpm. PT notified RN of pt's behavior during session, noting pt does not seem like himself on this date. When asked what is wrong, pt reports "My nose" and "my voice is gone". Pt self propelled w/c with BUE x 120 ft back to room & required supervision/Min A, as well as maximum encouragement to push with BUE for cardiovascular endurance due to pt wanting to pull w/c with BLE.  At end of session pt left in w/c with QRB in place & all needs within reach.   Therapy Documentation Precautions:  Precautions Precautions: Fall Restrictions Weight Bearing Restrictions: No  Vital Signs: Therapy Vitals BP: (!) 82/48 mmHg Patient Position (if appropriate): Standing  Pain: Pain Assessment Pain Assessment: No/denies pain    See Function Navigator for Current Functional Status.   Therapy/Group: Individual Therapy  Sandi MariscalVictoria M Rilan Eiland 05/13/2015, 7:47 AM

## 2015-05-13 NOTE — Progress Notes (Signed)
[place] [facility]     PROGRESS NOTE  Subjective/Complaints:  Patient seen this morning lying in bed watching WWE. He is not interested in engaging in conversation.   ROS: Denies pain but otherwise does not cooperate with ROS  Objective: Vital Signs: Blood pressure 125/72, pulse 101, temperature 98.9 F (37.2 C), temperature source Oral, resp. rate 17, height 5\' 9"  (1.753 m), weight 88.905 kg (196 lb), SpO2 98 %. No results found.  Recent Labs  05/12/15 1142  WBC 12.4*  HGB 9.6*  HCT 29.8*  PLT 312    Recent Labs  05/12/15 1142  NA 142  K 4.1  CL 108  GLUCOSE 105*  BUN 10  CREATININE 1.14  CALCIUM 9.2   CBG (last 3)   Recent Labs  05/11/15 1205  GLUCAP 100*    Wt Readings from Last 3 Encounters:  05/08/15 88.905 kg (196 lb)  04/07/15 104.8 kg (231 lb 0.7 oz)  03/07/15 102.694 kg (226 lb 6.4 oz)    Physical Exam:  BP 125/72 mmHg  Pulse 101  Temp(Src) 98.9 F (37.2 C) (Oral)  Resp 17  Ht 5\' 9"  (1.753 m)  Wt 88.905 kg (196 lb)  BMI 28.93 kg/m2  SpO2 98% Constitutional: NAD. Vital signs reviewed. Well-developed, well-nourished. He requires significant encouragement to participate HENT: Normocephalic. Atraumatic Eyes: Conjunctivae and EOM are normal.   Cardiovascular: + Tachycardia. Regular rhythm no murmur   Respiratory: Effort normal. No respiratory distress.  GI: Soft. Bowel sounds are normal. He exhibits no distension.  Musculoskeletal: refuses Neurological: He is alert and? Oriented 2.  Patient will make limited eye contact with examiner.  Difficult to engage.  Refusing MMT Previously:  Right upper extremity 5/5 proximal distal Left upper extremity: 4+/5 proximal to distal Right lower extremity: Hip flexion, knee extension 4/5, ankle dorsi/plantarflexion 5/5 Left lower extremity: Hip flexion, knee extension? 4/5, ankle dorsi/plantarflexion 5/5 Skin:  Trach site healed.  Psychiatric:  Very quiet. Hesitant to  engage.  Assessment/Plan: 1. Functional deficits secondary to debility which require 3+ hours per day of interdisciplinary therapy in a comprehensive inpatient rehab setting. Physiatrist is providing close team supervision and 24 hour management of active medical problems listed below. Physiatrist and rehab team continue to assess barriers to discharge/monitor patient progress toward functional and medical goals.  Function:  Bathing Bathing position   Position: Wheelchair/chair at sink  Bathing parts Body parts bathed by patient: Right arm, Left arm, Abdomen, Chest, Front perineal area, Buttocks, Right upper leg, Left upper leg, Right lower leg, Left lower leg Body parts bathed by helper: Back, Buttocks  Bathing assist Assist Level: More than reasonable time      Upper Body Dressing/Undressing Upper body dressing   What is the patient wearing?: Pull over shirt/dress     Pull over shirt/dress - Perfomed by patient: Thread/unthread right sleeve, Thread/unthread left sleeve, Put head through opening, Pull shirt over trunk          Upper body assist Assist Level: Set up, Supervision or verbal cues   Set up : To obtain clothing/put away  Lower Body Dressing/Undressing Lower body dressing   What is the patient wearing?: Underwear, Pants, Socks, Shoes Underwear - Performed by patient: Thread/unthread right underwear leg, Thread/unthread left underwear leg, Pull underwear up/down Underwear - Performed by helper: Pull underwear up/down Pants- Performed by patient: Thread/unthread right pants leg, Thread/unthread left pants leg, Pull pants up/down, Fasten/unfasten pants Pants- Performed by helper: Pull pants up/down Non-skid slipper socks- Performed  by patient: Don/doff left sock, Don/doff right sock   Socks - Performed by patient: Don/doff right sock, Don/doff left sock   Shoes - Performed by patient: Don/doff right shoe, Don/doff left shoe, Fasten right, Fasten left             Lower body assist Assist for lower body dressing: Set up   Set up : To obtain clothing/put away  Toileting Toileting Toileting activity did not occur: Refused Toileting steps completed by patient: Adjust clothing prior to toileting, Performs perineal hygiene, Adjust clothing after toileting Toileting steps completed by helper: Adjust clothing prior to toileting Toileting Assistive Devices: Other (comment) (standing using urinal at sink)  Toileting assist Assist level: Touching or steadying assistance (Pt.75%)   Transfers Chair/bed transfer   Chair/bed transfer method: Stand pivot Chair/bed transfer assist level: Supervision or verbal cues Chair/bed transfer assistive device: Armrests     Locomotion Ambulation     Max distance: 30 ft Assist level: Touching or steadying assistance (Pt > 75%)   Wheelchair   Type: Manual Max wheelchair distance: 150 Assist Level: Supervision or verbal cues  Cognition Comprehension Comprehension assist level: Understands basic 90% of the time/cues < 10% of the time  Expression Expression assist level: Expresses basic 75 - 89% of the time/requires cueing 10 - 24% of the time. Needs helper to occlude trach/needs to repeat words.  Social Interaction Social Interaction assist level: Interacts appropriately 50 - 74% of the time - May be physically or verbally inappropriate.  Problem Solving Problem solving assist level: Solves basic 50 - 74% of the time/requires cueing 25 - 49% of the time  Memory Memory assist level: Recognizes or recalls 75 - 89% of the time/requires cueing 10 - 24% of the time    Medical Problem List and Plan: 1. Debilitation secondary to respiratory failure/ARDS/multilobar pneumonia /encephalopathy -Continue CIR  -Patient's sister would like to speak to physician. 2. DVT Prophylaxis/Anticoagulation: SCDs/ambulate. Monitor for any signs of DVT 3. Pain Management: Tylenol 4. Mood/autism/mentally impaired:   Klonopin  0.5 mg daily at bedtime  Provigil 100 mg daily, wean to 50 mg on 5/3 5. Neuropsych: This patient is not capable of making decisions on his own behalf. 6. Skin/Wound Care: Routine skin checks 7. Fluids/Electrolytes/Nutrition: Routine I/Os  8. Ventilatory dependent respiratory failure/tracheostomy. Decannulation 04/25/2015 9. Acute kidney failure. Resolved. Patient did receive dialysis for a short time. Cr normalized. 10. Vancomycin resistant enterococci urinary tract infection. Treated. Continue contact precautions 11. Tachycardia/atrial fibrillation.   Continue Cardizem 60 mg every 8 hours,   Lopressor 25 mg twice a day, increased to 50 on 4/27, increased to 75 on 4/30  Will continue to monitor, slightly improved 12. History of sickle cell trait: Continue to monitor 13. Leukocytosis  WBCs 12.4 on 5/1  CXR ordered on 5/2 due to? Cough 14. Mild hypokalemia:  Potassium 4.1 on 5/1 15. Right knee pain  Will order muscle rub cream  LOS (Days) 7 A FACE TO FACE EVALUATION WAS PERFORMED  Ankit Karis Juba 05/13/2015 8:47 AM

## 2015-05-13 NOTE — Progress Notes (Addendum)
Pt sister, [name] has several questions regarding patients care. She is requesting to have an MD or PA call her tomorrow since she is not able to be here during the day. She has concerns with no cough medication being ordered, the flu vaccination, and the bumps on his face. RN explained that it was out of flu season, but she still thinks patient needs the shot.  Pam Love, ordered cough medication tonight. [name] was given Inpatient Rehab's phone number so she could call the floor tomorrow. Rudie MeyerEurillo, Jowan Skillin A, RN

## 2015-05-13 NOTE — Progress Notes (Signed)
Occupational Therapy Session Note  Patient Details  Name: Juan RipperRobert Cameron MRN: [id number] Date of Birth: 02-18-1975  Today's Date: 05/13/2015 OT Individual Time: 1000-1130 OT Individual Time Calculation (min): 90 min    Short Term Goals: Week 1:  OT Short Term Goal 1 (Week 1): STG=LTG due to LOS  Skilled Therapeutic Interventions/Progress Updates: Therapeutic activity (60 min) with focus on improved sit<>stand, endurance, dynamic standing balance, and UE strengthening.   Pt received supine in bed watching his television.   With encouragement from male staff (RN tech, Armeniahina), pt rose to edge of bed unassisted and displayed his dance moves while seated in rhythm to music video to HydrologistN tech.  Pt challenged therapist to Wii bowling activity and vowed to win (pt had not won a Microbiologistmatch to date).   Pt was escorted to gym and completed 2 games with the 1st game seated and refusing to stand (as directed) but the second game pt stood the entire 10 frames (approx 6 minutes) with RW in front of him as HydrologistN tech was invited to observe.   Pt clearly demonstrates appreciation of male staff encouragement and his performance in all tasks greatly improves with added attention and applause, etc.   Pt was then escorted to ortho gym and performed 2 sets of 10 shoulder pull-downs and bicep curls but requested termination of therex d/t fatigue.   Pt returned to his room and completed ADL-retraining with setup and vc to progress through activity.   Pt performed lower body ADL at toilet after requesting assist to void urine.   Pt performed sit<> 5 times unassisted from w/c and ambulated 5' w/o RW with only min guard assist for safety.   Pt left in w/c at end of session with QRB applied d/t impulsivity.         Therapy Documentation Precautions:  Precautions Precautions: Fall Restrictions Weight Bearing Restrictions: No  Vital Signs: Therapy Vitals BP: (!) 82/48 mmHg Patient Position (if appropriate):  Standing  Pain: Pain Assessment Pain Assessment: No/denies pain  See Function Navigator for Current Functional Status.   Therapy/Group: Individual Therapy  Marcellous Snarski 05/13/2015, 1:18 PM

## 2015-05-13 NOTE — Progress Notes (Signed)
Pt instructed on use of Flutter valve.  Pt demonstrated with good effort and technique, RT to monitor and assess as needed.

## 2015-05-14 ENCOUNTER — Inpatient Hospital Stay (HOSPITAL_COMMUNITY): Payer: Medicare Other

## 2015-05-14 ENCOUNTER — Inpatient Hospital Stay (HOSPITAL_COMMUNITY): Payer: Medicare Other | Admitting: Physical Therapy

## 2015-05-14 ENCOUNTER — Inpatient Hospital Stay (HOSPITAL_COMMUNITY): Payer: Medicare Other | Admitting: Occupational Therapy

## 2015-05-14 DIAGNOSIS — R918 Other nonspecific abnormal finding of lung field: Secondary | ICD-10-CM

## 2015-05-14 DIAGNOSIS — A419 Sepsis, unspecified organism: Secondary | ICD-10-CM | POA: Insufficient documentation

## 2015-05-14 DIAGNOSIS — R0902 Hypoxemia: Secondary | ICD-10-CM

## 2015-05-14 DIAGNOSIS — J9811 Atelectasis: Secondary | ICD-10-CM

## 2015-05-14 DIAGNOSIS — J189 Pneumonia, unspecified organism: Secondary | ICD-10-CM

## 2015-05-14 LAB — CBC
HEMATOCRIT: 26.5 % — AB (ref 39.0–52.0)
HEMOGLOBIN: 8.6 g/dL — AB (ref 13.0–17.0)
MCH: 26.5 pg (ref 26.0–34.0)
MCHC: 32.5 g/dL (ref 30.0–36.0)
MCV: 81.5 fL (ref 78.0–100.0)
Platelets: 277 10*3/uL (ref 150–400)
RBC: 3.25 MIL/uL — ABNORMAL LOW (ref 4.22–5.81)
RDW: 15.9 % — AB (ref 11.5–15.5)
WBC: 12.2 10*3/uL — ABNORMAL HIGH (ref 4.0–10.5)

## 2015-05-14 LAB — PROCALCITONIN: Procalcitonin: <0.1 ng/mL

## 2015-05-14 MED ORDER — PIPERACILLIN-TAZOBACTAM 3.375 G IVPB
3.3750 g | Freq: Three times a day (TID) | INTRAVENOUS | Status: DC
  Filled 2015-05-14 (×4): qty 50

## 2015-05-14 MED ORDER — PIPERACILLIN-TAZOBACTAM 3.375 G IVPB 30 MIN
3.3750 g | Freq: Once | INTRAVENOUS | Status: DC
  Filled 2015-05-14: qty 50

## 2015-05-14 MED ORDER — VANCOMYCIN HCL 10 G IV SOLR
1500.0000 mg | Freq: Once | INTRAVENOUS | Status: DC
  Filled 2015-05-14: qty 1500

## 2015-05-14 MED ORDER — LEVOFLOXACIN 500 MG PO TABS: 750.0000 mg | ORAL_TABLET | Freq: Every day | ORAL | Status: DC

## 2015-05-14 MED ORDER — VANCOMYCIN HCL IN DEXTROSE 1-5 GM/200ML-% IV SOLN
1000.0000 mg | Freq: Two times a day (BID) | INTRAVENOUS | Status: DC
  Filled 2015-05-14 (×3): qty 200

## 2015-05-14 NOTE — Progress Notes (Addendum)
Patient was brought back from therapy at approximately 1030. Therapist reported low blood pressure in the 90s/50s. BP was rechecked prior to giving 2pm dose of cardizem. BP 93/48. BP med was held due to hypotension. Charge nurse made aware

## 2015-05-14 NOTE — Progress Notes (Signed)
Social Work Lucy Chrisebecca G Gagandeep Pettet, LCSW Social Worker Signed  Patient Care Conference 05/14/2015  3:45 PM    Expand All Collapse All   Inpatient RehabilitationTeam Conference and Plan of Care Update Date: 05/14/2015   Time: 2:00 PM     Patient Name: Juan RipperRobert Wickham       Medical Record Number: [id number]  Date of Birth: Oct 28, 1975 Sex: Juan Cameron         Room/Bed: 4W17C/4W17C-01 Payor Info: Payor: MEDICARE / Plan: MEDICARE PART A AND B / Product Type: *No Product type* /    Admitting Diagnosis: Debility   Admit Date/Time:  05/06/2015  5:22 PM Admission Comments: No comment available   Primary Diagnosis:  Debility Principal Problem: Debility    Patient Active Problem List     Diagnosis  Date Noted   .  Sepsis (HCC)     .  Cough     .  Right knee pain     .  Cognitive deficits     .  Tachycardia     .  Debility  05/06/2015   .  Encephalopathy  05/06/2015   .  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  Ventilator dependence (HCC)  04/08/2015   .  Pressure ulcer  04/05/2015   .  Acute respiratory failure (HCC)     .  Fever  03/21/2015   .  Acute renal insufficiency  03/21/2015   .  Obesity  03/21/2015   .  ARDS (adult respiratory distress syndrome) (HCC)     .  HCAP (healthcare-associated pneumonia)  03/11/2015   .  Leukocytosis  03/11/2015   .  Acute hyperglycemia  03/11/2015   .  Left ventricular diastolic dysfunction, NYHA class 2  03/11/2015   .  Thrombocytopenia (HCC)  03/11/2015   .  Acute lung injury  03/10/2015   .  Acute respiratory failure with hypoxia (HCC)  02/28/2015   .  CAP (community acquired pneumonia)  02/28/2015   .  Hypokalemia  02/28/2015   .  Autism  02/28/2015     Expected Discharge Date: Expected Discharge Date: 05/21/15  Team Members Present: Physician leading conference: Dr. Maryla MorrowAnkit Patel Social Worker Present: Dossie DerBecky Vennie Waymire, LCSW Nurse Present: Chana Bodeeborah Sharp, RN PT Present: Other (comment) Aleda Grana(Victoria Miller & Antonietta JewelAustin Tucker-PT) OT Present: Roney MansJennifer Smith,  OT SLP Present: Jackalyn LombardNicole Page, SLP PPS Coordinator present : Tora DuckMarie Noel, RN, CRRN        Current Status/Progress  Goal  Weekly Team Focus   Medical     Debilitation secondary to respiratory failure/ARDS/multilobar pneumonia with ?PNA  Improve cough, leukocytosis, endurance, mobility   see above   Bowel/Bladder     continent of bowel and bladder/ able to use urinal indep but spills at times/LBM 05/12/15  continent of bowel and bladder  cont. of monitor q shift   Swallow/Nutrition/ Hydration               ADL's     Supervision for BADL and transfers   Supervision for BADL, transfers, and dynamic standing balance   Improved activity tolerance, transfers, sit<>stand, dynamic standing balance, BUE therex   Mobility     supervision for bed mobility & transfers, steady A with RW for gait for maximumof 175 ft but averages 40 ft with max encouragement, stairs with Min A & B rails  supervision for standing balance, Mod I bed mobility, supervision transfers & ambulation with LRAD, 3 stairs without rails & supervision  endurance training, gait & stair  training, balance training    Communication               Safety/Cognition/ Behavioral Observations    no unsafe behavior/calls for assistance   min assist  continue to monitor q shift   Pain     no complaints of pain  pain less than or equal to 2  continue to monitor q shift   Skin     no skin breakdown this admission/rash to face-hydrocodone cream to area  min assistance  continue to monitor q shift      *See Care Plan and progress notes for long and short-term goals.    Barriers to Discharge:  Autism, MR, tachy, leukocytosis     Possible Resolutions to Barriers:   Follow labs, improve conditioning and HR, Pulm consult      Discharge Planning/Teaching Needs:   Home with sister she is trying to figure out care for pt until he can return to adult day care. Wants to keep home until fully recovered        Team Discussion:    Goals  supervision level-medical issues-pneumonia called in pulmonary and will have swallow eval. Currently on 3 l of O2 to sustain 88-90%. BP issues also. Very deconditioned. Sister wants to be kept updated regarding his medical issues since she is his guardian. Had improved in his activity tolerance before this issue   Revisions to Treatment Plan:    Medical issues    Continued Need for Acute Rehabilitation Level of Care: The patient requires daily medical management by a physician with specialized training in physical medicine and rehabilitation for the following conditions: Daily direction of a multidisciplinary physical rehabilitation program to ensure safe treatment while eliciting the highest outcome that is of practical value to the patient.: Yes Daily medical management of patient stability for increased activity during participation in an intensive rehabilitation regime.: Yes Daily analysis of laboratory values and/or radiology reports with any subsequent need for medication adjustment of medical intervention for : Other;Pulmonary problems;Mood/behavior problems  Lucy Chris 05/14/2015, 3:45 PM                  Patient ID: Juan Cameron, Juan Cameron   DOB: [birth year], 40 y.o.   MRN: [id number]

## 2015-05-14 NOTE — Progress Notes (Signed)
Occupational Therapy Session Note  Patient Details  Name: Juan Cameron MRN: [id number] Date of Birth: [birth year]  Today's Date: 05/14/2015 OT Individual Time: [id number] OT Individual Time Calculation (min): 83 min    Short Term Goals: Week 1:  OT Short Term Goal 1 (Week 1): STG=LTG due to LOS  Skilled Therapeutic Interventions/Progress Updates:  Upon entering the room, pt seated in wheelchair and playing video games. Pt propelling wheelchair with B LEs and UE's 200' towards ortho gym with supervision and 2 rest breaks secondary to fatigue. Pt remained on 3L O2 via [place] with O@ saturation being above 90% throughout session. Pt reports need for toileting with min A for transfer onto elevated commode chair. Pt having BM and needing steady assist while standing for hygiene and clothing management. Pt returning to wheelchair and returning to room in same manner as above. Pt engaged in B UE strengthening exercises with 4 lbs dowel rod for bicep curls, straight arm raises, forward rows, and backwards rows with rest breaks as needed secondary to fatigue. Pt requesting to return to bed with steady assist. Pt supine in bed with RN arriving with medications. Call bell and all needed items within reach upon exiting the room.   Therapy Documentation Precautions:  Precautions Precautions: Fall Restrictions Weight Bearing Restrictions: No Vital Signs: Therapy Vitals BP: (!) 93/48 mmHg Oxygen Therapy SpO2: 99 % O2 Device: Nasal Cannula O2 Flow Rate (L/min): 3 L/min Pain: Pain Assessment Pain Assessment: 0-10 Pain Score: 0-No pain  See Function Navigator for Current Functional Status.   Therapy/Group: Individual Therapy  Lowella Gripittman, Ronnie Doo L 05/14/2015, 2:56 PM

## 2015-05-14 NOTE — Progress Notes (Signed)
[place] PHYSICAL MEDICINE & REHABILITATION     PROGRESS NOTE  Subjective/Complaints:  Pt watching WWE this AM.  Pt does better with male staff, unwilling to engage.  ROS: Denies pain but otherwise does not cooperate with ROS  Objective: Vital Signs: Blood pressure 116/78, pulse 95, temperature 98.7 F (37.1 C), temperature source Oral, resp. rate 18, height  (1.753 m), weight 88.905 kg (196 lb), SpO2 94 %. Dg Chest 2 View  05/14/2015  CLINICAL DATA:  Shortness of breath.  Cough. EXAM: CHEST  2 VIEW COMPARISON:  05/13/2015. FINDINGS: Mediastinum and hilar structures normal. Progressive diffuse bilateral pulmonary infiltrates. Heart size normal. No pleural effusion or pneumothorax. IMPRESSION: 1. Diffuse bilateral pulmonary infiltrates, progressive from prior exam. 2.  Heart size stable . Electronically Signed   By: Maisie Fus  Register   On: 05/14/2015 08:15   Dg Chest Port 1 View  05/13/2015  CLINICAL DATA:  Cough. EXAM: PORTABLE CHEST 1 VIEW COMPARISON:  April 15, 2015. FINDINGS: Stable cardiomediastinal silhouette. Tracheostomy tube, nasogastric tube and right internal jugular catheter have been removed. No pneumothorax is noted. Hypoinflation of the lungs is noted. Increased bibasilar opacities are noted concerning for atelectasis, infiltrate or edema. No significant pleural effusion is noted. Bony thorax is unremarkable. IMPRESSION: Support apparatus have been removed. Hypoinflation of the lungs is noted. Increased bibasilar opacities are noted concerning for atelectasis, infiltrates or edema. Electronically Signed   By: Lupita Raider, M.D.   On: 05/13/2015 10:31    Recent Labs  05/12/15 1142 05/14/15 0544  WBC 12.4* 12.2*  HGB 9.6* 8.6*  HCT 29.8* 26.5*  PLT 312 277    Recent Labs  05/12/15 1142  NA 142  K 4.1  CL 108  GLUCOSE 105*  BUN 10  CREATININE 1.14  CALCIUM 9.2   CBG (last 3)   Recent Labs  05/11/15 1205  GLUCAP 100*    Wt Readings from Last 3  Encounters:  05/08/15 88.905 kg (196 lb)  04/07/15 104.8 kg (231 lb 0.7 oz)  03/07/15 102.694 kg (226 lb 6.4 oz)    Physical Exam:  BP 116/78 mmHg  Pulse 95  Temp(Src) 98.7 F (37.1 C) (Oral)  Resp 18  Ht  (1.753 m)  Wt 88.905 kg (196 lb)  BMI 28.93 kg/m2  SpO2 94% Constitutional: NAD. Vital signs reviewed. Well-developed, well-nourished. He requires significant encouragement to participate HENT: Normocephalic. Atraumatic Eyes: Conjunctivae and EOM are normal.   Cardiovascular: Regular rate and rhythm no murmur   Respiratory: Effort normal. No respiratory distress.  GI: Soft. Bowel sounds are normal. He exhibits no distension.  Musculoskeletal: refuses Neurological: He is alert and? Oriented 2.  Patient will make limited eye contact with examiner.  Difficult to engage.  Refusing MMT Previously:  Right upper extremity 5/5 proximal distal Left upper extremity: 4+/5 proximal to distal Right lower extremity: Hip flexion, knee extension 4/5, ankle dorsi/plantarflexion 5/5 Left lower extremity: Hip flexion, knee extension? 4/5, ankle dorsi/plantarflexion 5/5 Skin:  Trach site healed.  Psychiatric:  Very quiet. Hesitant to engage.  Assessment/Plan: 1. Functional deficits secondary to debility which require 3+ hours per day of interdisciplinary therapy in a comprehensive inpatient rehab setting. Physiatrist is providing close team supervision and 24 hour management of active medical problems listed below. Physiatrist and rehab team continue to assess barriers to discharge/monitor patient progress toward functional and medical goals.  Function:  Bathing Bathing position   Position: Wheelchair/chair at sink  Bathing parts Body parts bathed by patient: Right  arm, Left arm, Abdomen, Chest, Front perineal area, Buttocks, Right upper leg, Left upper leg, Right lower leg, Left lower leg Body parts bathed by helper: Back, Buttocks  Bathing assist Assist Level: More than  reasonable time      Upper Body Dressing/Undressing Upper body dressing   What is the patient wearing?: Pull over shirt/dress     Pull over shirt/dress - Perfomed by patient: Thread/unthread right sleeve, Thread/unthread left sleeve, Put head through opening, Pull shirt over trunk          Upper body assist Assist Level: Set up, Supervision or verbal cues   Set up : To obtain clothing/put away  Lower Body Dressing/Undressing Lower body dressing   What is the patient wearing?: Underwear, Pants, Socks, Shoes Underwear - Performed by patient: Thread/unthread right underwear leg, Thread/unthread left underwear leg, Pull underwear up/down Underwear - Performed by helper: Pull underwear up/down Pants- Performed by patient: Thread/unthread right pants leg, Thread/unthread left pants leg, Pull pants up/down, Fasten/unfasten pants Pants- Performed by helper: Pull pants up/down Non-skid slipper socks- Performed by patient: Don/doff left sock, Don/doff right sock   Socks - Performed by patient: Don/doff right sock, Don/doff left sock   Shoes - Performed by patient: Don/doff right shoe, Don/doff left shoe, Fasten right, Fasten left            Lower body assist Assist for lower body dressing: Set up   Set up : To obtain clothing/put away  Toileting Toileting Toileting activity did not occur: Refused Toileting steps completed by patient: Adjust clothing prior to toileting, Performs perineal hygiene, Adjust clothing after toileting Toileting steps completed by helper: Adjust clothing prior to toileting Toileting Assistive Devices: Other (comment) (standing using urinal at sink)  Toileting assist Assist level: Supervision or verbal cues   Transfers Chair/bed transfer   Chair/bed transfer method: Stand pivot Chair/bed transfer assist level: Supervision or verbal cues Chair/bed transfer assistive device: Armrests     Locomotion Ambulation     Max distance: 175 Assist level:  Touching or steadying assistance (Pt > 75%)   Wheelchair   Type: Manual Max wheelchair distance: 120 ft Assist Level: Touching or steadying assistance (Pt > 75%)  Cognition Comprehension Comprehension assist level: Understands basic 90% of the time/cues < 10% of the time  Expression Expression assist level: Expresses basic 75 - 89% of the time/requires cueing 10 - 24% of the time. Needs helper to occlude trach/needs to repeat words.  Social Interaction Social Interaction assist level: Interacts appropriately 50 - 74% of the time - May be physically or verbally inappropriate.  Problem Solving Problem solving assist level: Solves basic 50 - 74% of the time/requires cueing 25 - 49% of the time  Memory Memory assist level: Recognizes or recalls 75 - 89% of the time/requires cueing 10 - 24% of the time    Medical Problem List and Plan: 1. Debilitation secondary to respiratory failure/ARDS/multilobar pneumonia /encephalopathy -Continue CIR 2. DVT Prophylaxis/Anticoagulation: SCDs/ambulate. Monitor for any signs of DVT 3. Pain Management: Tylenol 4. Mood/autism/mentally impaired:   Klonopin 0.5 mg daily at bedtime  Provigil 100 mg daily, wean to 50 mg on 5/3 5. Neuropsych: This patient is not capable of making decisions on his own behalf. 6. Skin/Wound Care: Routine skin checks 7. Fluids/Electrolytes/Nutrition: Routine I/Os  8. Ventilatory dependent respiratory failure/tracheostomy. Decannulation 04/25/2015 9. Acute kidney failure. Resolved. Patient did receive dialysis for a short time. Cr normalized. 10. Vancomycin resistant enterococci urinary tract infection. Treated. Continue contact precautions 11. Tachycardia/atrial fibrillation.  Continue Cardizem 60 mg every 8 hours,   Lopressor 25 mg twice a day, increased to 50 on 4/27, increased to 75 on 4/30  Will continue to monitor, slightly improved 12. History of sickle cell trait: Continue to monitor 13. Leukocytosis  WBCs  12.2 on 5/3  CXR reviewed on 5/3, suggesting infiltrates  Levaquin 750mg  started on 5/3  Will consider Pulm consult 14. Mild hypokalemia: Resolved  Potassium 4.1 on 5/1 15. Right knee pain  Muscle rub cream ordered  LOS (Days) 8 A FACE TO FACE EVALUATION WAS PERFORMED  Ankit Karis Jubanil Patel 05/14/2015 8:57 AM

## 2015-05-14 NOTE — Progress Notes (Signed)
Pharmacy Antibiotic Note  Juan Cameron is a 40 y.o. male admitted on 05/06/2015 with pneumonia.  Pharmacy has been consulted for Vancomycin and Zosyn dosing.  Pt with R-sided infiltrate on CXR and recent prolonged hospitalization for multilobar pna and respiratory failure.  Levels during March admission during AKI requiring HD, which has now resolved.  [facility] in [facility] until admission to CIR, and he did not receive Vanc during that time.  Plan: Zosyn 3.375g IV q8, infuse over 4hr Vancomycin 1000mg  IV q12 Check Vanc trough at steady state Watch renal fxn and f/u any cx data  Height: 5\' 9"  (175.3 cm) Weight: 196 lb (88.905 kg) IBW/kg (Calculated) : 70.7  Temp (24hrs), Avg:98.5 F (36.9 C), Min:98.2 F (36.8 C), Max:98.7 F (37.1 C)   Recent Labs Lab 05/08/15 0550 05/12/15 1142 05/14/15 0544  WBC 12.0* 12.4* 12.2*  CREATININE 1.10 1.14  --     Estimated Creatinine Clearance: 96.9 mL/min (by C-G formula based on Cr of 1.14).    Allergies  Allergen Reactions  . Peanuts [Peanut Oil] Anaphylaxis    Antimicrobials this admission: Vanc  5/3 >>  Zosyn 5/3 >>   Microbiology results: 5/2 UCx:     Thank you for allowing pharmacy to be a part of this patient's care.  Renaee MundaHiatt, Jama Mcmiller P 05/14/2015 2:18 PM

## 2015-05-14 NOTE — Progress Notes (Signed)
Social Work Patient ID: Juan Cameron, male   DOB: 20-Nov-1975, 40 y.o.   MRN: [id number] Spoke with Juan Cameron-sister via telephone to discuss team conference goals supervision level and target discharge 5/10. She expressed she wants to be contacted regarding his medical issues since she is his guardian and does not want him discharged until he is off O2 and more medically stable. She does not want to go through what she did with the last discharge and re-admission here.  She reports he almost died. Informed her it is up to the MD whether he is medically stable or not. Discussed his need for 24 hr supervision she reports she needs a letter for in-home care. Have asked MD pt contact sister To discuss medically how pt is doing and he may go home on O2. Sister is here every night and stays the night with him and then is gone by 7:00 am, so she knows how he is doing functionally. Will work on a safe discharge Plan.

## 2015-05-14 NOTE — Progress Notes (Signed)
Physical Therapy Session Note  Patient Details  Name: Juan Cameron MRN: [id number] Date of Birth: 04-23-1975  Today's Date: 05/14/2015 PT Individual Time: 0902-1001 PT Individual Time Calculation (min): 59 min   Short Term Goals: Week 1:  PT Short Term Goal 1 (Week 1): Pt will perform squat/stand pivot transfers with steady A. PT Short Term Goal 2 (Week 1): Pt will ambulate 50 ft with steady A & LRAD. PT Short Term Goal 3 (Week 1): Pt will transfer sit<>stand with steady A.  Skilled Therapeutic Interventions/Progress Updates:    Patient received supine in bed and agreeable to PT. Patient performed bed mobility without cues from PT but required increased time. At start of PT session patient received on 3L O2/min. PT assessed patient on 2L/min and patient desat to <85 percent and unable to come above 88% with pursed lip breathing. PT increased O2 to 3L/min and patient increased to 94% with pursed lip breathing.   Patient performed sit<>stand transfer with RW x 12 throughout treatment with min A from PT for improved safety and min cues for UE placement. Gait training with RW for 68f with RW x 3 and min A from PT for improved safety and cues for increased width of BOS. Patient O2 remained >87% on 3L O2/min.  Patient performed standing tolerance with Wii bowling and able to maintain standing with 1 UE supported with Supervision A from PT for 2 minutes.  Patient noted to have increased fatigue and increased lethargy following standing tolerance. PT assessed BP at 156/127 and the re-assed in 2 minutes 98/56. RN notified of BP results.  [facility] and all other needs met.    Therapy Documentation Precautions:  Precautions Precautions: Fall Restrictions Weight Bearing Restrictions: No General:   Vital Signs: Therapy Vitals Temp: 98.6 F (37 C) Temp Source: Oral Pulse Rate: 98 Resp: 18 BP: 120/70 mmHg Patient Position (if  appropriate): Lying Oxygen Therapy SpO2: 94 % (used flutter valve) O2 Device: Nasal Cannula O2 Flow Rate (L/min): 3 L/min Pain: Pain Assessment Pain Assessment: 0-10 Pain Score: 0-No pain  See Function Navigator for Current Functional Status.   Therapy/Group: Individual Therapy  ALorie Phenix5/03/2015, 6:05 PM

## 2015-05-14 NOTE — Patient Care Conference (Signed)
Inpatient RehabilitationTeam Conference and Plan of Care Update Date: 05/14/2015   Time: 2:00 PM    Patient Name: Juan Cameron      Medical Record Number: [id number]  Date of Birth: Aug 09, 1975 Sex: Male         Room/Bed: 4W17C/4W17C-01 Payor Info: Payor: MEDICARE / Plan: MEDICARE PART A AND B / Product Type: *No Product type* /    Admitting Diagnosis: Debility  Admit Date/Time:  05/06/2015  5:22 PM Admission Comments: No comment available   Primary Diagnosis:  Debility Principal Problem: Debility  Patient Active Problem List   Diagnosis Date Noted  . Sepsis (HCC)   . Cough   . Right knee pain   . Cognitive deficits   . Tachycardia   . Debility 05/06/2015  . Encephalopathy 05/06/2015  . 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 Ventilator dependence (HCC) 04/08/2015  . Pressure ulcer 04/05/2015  . Acute respiratory failure (HCC)   . Fever 03/21/2015  . Acute renal insufficiency 03/21/2015  . Obesity 03/21/2015  . ARDS (adult respiratory distress syndrome) (HCC)   . HCAP (healthcare-associated pneumonia) 03/11/2015  . Leukocytosis 03/11/2015  . Acute hyperglycemia 03/11/2015  . Left ventricular diastolic dysfunction, NYHA class 2 03/11/2015  . Thrombocytopenia (HCC) 03/11/2015  . Acute lung injury 03/10/2015  . Acute respiratory failure with hypoxia (HCC) 02/28/2015  . CAP (community acquired pneumonia) 02/28/2015  . Hypokalemia 02/28/2015  . Autism 02/28/2015    Expected Discharge Date: Expected Discharge Date: 05/21/15  Team Members Present: Physician leading conference: Dr. Maryla Morrow Social Worker Present: Dossie Der, LCSW Nurse Present: Chana Bode, RN PT Present: Other (comment) Aleda Grana & Antonietta Jewel) OT Present: Roney Mans, OT SLP Present: Jackalyn Lombard, SLP PPS Coordinator present : Tora Duck, RN, CRRN     Current Status/Progress Goal Weekly Team Focus  Medical   Debilitation secondary to respiratory failure/ARDS/multilobar  pneumonia with ?PNA  Improve cough, leukocytosis, endurance, mobility  see above   Bowel/Bladder   continent of bowel and bladder/ able to use urinal indep but spills at times/LBM 05/12/15  continent of bowel and bladder  cont. of monitor q shift   Swallow/Nutrition/ Hydration             ADL's   Supervision for BADL and transfers  Supervision for BADL, transfers, and dynamic standing balance  Improved activity tolerance, transfers, sit<>stand, dynamic standing balance, BUE therex   Mobility   supervision for bed mobility & transfers, steady A with RW for gait for maximumof 175 ft but averages 40 ft with max encouragement, stairs with Min A & B rails  supervision for standing balance, Mod I bed mobility, supervision transfers & ambulation with LRAD, 3 stairs without rails & supervision  endurance training, gait & stair training, balance training   Communication             Safety/Cognition/ Behavioral Observations  no unsafe behavior/calls for assistance  min assist  continue to monitor q shift   Pain   no complaints of pain  pain less than or equal to 2  continue to monitor q shift   Skin   no skin breakdown this admission/rash to face-hydrocodone cream to area  min assistance  continue to monitor q shift      *See Care Plan and progress notes for long and short-term goals.  Barriers to Discharge: Autism, MR, tachy, leukocytosis    Possible Resolutions to Barriers:  Follow labs, improve conditioning and HR, Pulm consult    Discharge Planning/Teaching Needs:  Home with sister she is trying to figure out care for pt until he can return to adult day care. Wants to keep home until fully recovered      Team Discussion:  Goals supervision level-medical issues-pneumonia called in pulmonary and will have swallow eval. Currently on 3 l of O2 to sustain 88-90%. BP issues also. Very deconditioned. Sister wants to be kept updated regarding his medical issues since she is his guardian. Had  improved in his activity tolerance before this issue  Revisions to Treatment Plan:  Medical issues   Continued Need for Acute Rehabilitation Level of Care: The patient requires daily medical management by a physician with specialized training in physical medicine and rehabilitation for the following conditions: Daily direction of a multidisciplinary physical rehabilitation program to ensure safe treatment while eliciting the highest outcome that is of practical value to the patient.: Yes Daily medical management of patient stability for increased activity during participation in an intensive rehabilitation regime.: Yes Daily analysis of laboratory values and/or radiology reports with any subsequent need for medication adjustment of medical intervention for : Other;Pulmonary problems;Mood/behavior problems  Juan Cameron, Juan Cameron 05/14/2015, 3:45 PM

## 2015-05-14 NOTE — Progress Notes (Signed)
Spoke with patients caregiver, Annice PihCourtney Sherfield, regarding chest xray results and that we are starting him on levaquin.  Sister satisfied with information given.  Dani Gobbleeardon, Nashaly Dorantes J, RN

## 2015-05-14 NOTE — Consult Note (Addendum)
Name: Juan Cameron MRN: [id number] DOB: 1975/11/27    ADMISSION DATE:  05/06/2015 CONSULTATION DATE:  05/14/15  REFERRING MD :  Dr. Posey Pronto   CHIEF COMPLAINT:  Evaluate for Infiltrates    HISTORY OF PRESENT ILLNESS:  40 y/o M with PMH of sickle cell trait, PNA and autism with recent prolonged hospitalization for multilobar PNA with ARDS, AKI requiring HD (resolved, off HD), prolonged respiratory failure s/p tracheostomy.  He was discharged to [facility] on 3/27 for further ventilator weaning.  At [facility], he was He subsequently was decannulated and transferred to [facility] on 4/25.    PAST MEDICAL HISTORY :   has a past medical history of Autism; Sickle cell trait ([place]); Pneumonia (02/2015); and Autism.  has past surgical history that includes No past surgeries.   Prior to Admission medications   Medication Sig Start Date End Date Taking? Authorizing Provider  acetaminophen (TYLENOL) 160 MG/5ML solution Place 20.3 mLs (650 mg total) into feeding tube every 4 (four) hours as needed for mild pain, headache or fever. 04/07/15   Bincy S Varughese, NP  albuterol (PROVENTIL) (2.5 MG/3ML) 0.083% nebulizer solution Take 2.5 mg by nebulization every 6 (six) hours as needed for wheezing or shortness of breath.    Historical Provider, MD  Amino Acids-Protein Hydrolys (FEEDING SUPPLEMENT, PRO-STAT SUGAR FREE 64,) LIQD Place 30 mLs into feeding tube 2 (two) times daily. 04/07/15   Bincy S Varughese, NP  chlorhexidine gluconate, SAGE KIT, (PERIDEX) 0.12 % solution 15 mLs by Mouth Rinse route 2 (two) times daily. 04/07/15   Bincy S Varughese, NP  clonazePAM (KLONOPIN) 1 MG tablet Take 1 tablet (1 mg total) by mouth 2 (two) times daily. 04/07/15   Bincy S Varughese, NP  docusate (COLACE) 50 MG/5ML liquid Place 10 mLs (100 mg total) into feeding tube daily. 04/07/15   Bincy S Varughese, NP  fentaNYL (SUBLIMAZE) 100 MCG/2ML injection Inject 0.5-1 mLs (25-50 mcg total) into the vein every 2 (two) hours as needed for  severe pain. 04/07/15   Bincy S Varughese, NP  heparin 5000 UNIT/ML injection Inject 1 mL (5,000 Units total) into the skin every 8 (eight) hours. 04/07/15   Bincy S Varughese, NP  insulin aspart (NOVOLOG) 100 UNIT/ML injection Inject 2-6 Units into the skin every 4 (four) hours. 04/07/15   Bincy S Varughese, NP  ipratropium-albuterol (DUONEB) 0.5-2.5 (3) MG/3ML SOLN Take 3 mLs by nebulization 3 (three) times daily. 04/07/15   Bincy S Varughese, NP  meropenem 500 mg in sodium chloride 0.9 % 50 mL Inject 500 mg into the vein daily. 04/07/15   Bincy S Varughese, NP  Nutritional Supplements (FEEDING SUPPLEMENT, VITAL HIGH PROTEIN,) LIQD liquid Place 1,000 mLs into feeding tube daily. 04/07/15   Bincy S Varughese, NP  pantoprazole sodium (PROTONIX) 40 mg/20 mL PACK Place 20 mLs (40 mg total) into feeding tube daily at 12 noon. 04/07/15   Bincy S Varughese, NP  polyethylene glycol (MIRALAX / GLYCOLAX) packet Take 17 g by mouth daily as needed for moderate constipation. 04/07/15   Holley Raring, NP   Allergies  Allergen Reactions  . Peanuts [Peanut Oil] Anaphylaxis    FAMILY HISTORY:  family history includes Heart attack in his mother.   SOCIAL HISTORY:  reports that he has never smoked. He has never used smokeless tobacco. He reports that he does not drink alcohol or use illicit drugs.  REVIEW OF SYSTEMS:   Constitutional: Negative for fever, chills, weight loss, malaise/fatigue and diaphoresis.  HENT:  Negative for hearing loss, ear pain, nosebleeds, congestion, sore throat, neck pain, tinnitus and ear discharge.   Eyes: Negative for blurred vision, double vision, photophobia, pain, discharge and redness.  Respiratory: Negative for cough, hemoptysis, sputum production, shortness of breath, wheezing and stridor.   Cardiovascular: Negative for chest pain, palpitations, orthopnea, claudication, leg swelling and PND.  Gastrointestinal: Negative for heartburn, nausea, vomiting, abdominal pain, diarrhea,  constipation, blood in stool and melena.  Genitourinary: Negative for dysuria, urgency, frequency, hematuria and flank pain.  Musculoskeletal: Negative for myalgias, back pain, joint pain and falls.  Skin: Negative for itching and rash.  Neurological: Negative for dizziness, tingling, tremors, sensory change, speech change, focal weakness, seizures, loss of consciousness, weakness and headaches.  Endo/Heme/Allergies: Negative for environmental allergies and polydipsia. Does not bruise/bleed easily.  SUBJECTIVE: No event  VITAL SIGNS: Temp:  [98.2 F (36.8 C)-98.7 F (37.1 C)] 98.7 F (37.1 C) (05/03 0439) Pulse Rate:  [95-115] 95 (05/03 0439) Resp:  [18] 18 (05/02 1557) BP: (116-124)/(57-78) 116/78 mmHg (05/03 0439) SpO2:  [84 %-95 %] 84 % (05/03 1139)  PHYSICAL EXAMINATION: General:  Well appearing, NAD. Neuro:  Alert and interactive, moving all ext to command. HEENT:  [place]/AT, PERRL, EOM-I and MMM. Cardiovascular:  RRR, Nl S1/S2, -M/R/G. Lungs:  CTA bilaterally. Abdomen:  Soft, NT, ND and +BS. Musculoskeletal:  -edema and -tenderness. Skin:  Intact.   Recent Labs Lab 05/08/15 0550 05/12/15 1142  NA 142 142  K 3.4* 4.1  CL 107 108  CO2 24 23  BUN 7 10  CREATININE 1.10 1.14  GLUCOSE 99 105*    Recent Labs Lab 05/08/15 0550 05/12/15 1142 05/14/15 0544  HGB 9.3* 9.6* 8.6*  HCT 27.4* 29.8* 26.5*  WBC 12.0* 12.4* 12.2*  PLT 322 312 277   Dg Chest 2 View  05/14/2015  CLINICAL DATA:  Shortness of breath.  Cough. EXAM: CHEST  2 VIEW COMPARISON:  05/13/2015. FINDINGS: Mediastinum and hilar structures normal. Progressive diffuse bilateral pulmonary infiltrates. Heart size normal. No pleural effusion or pneumothorax. IMPRESSION: 1. Diffuse bilateral pulmonary infiltrates, progressive from prior exam. 2.  Heart size stable . Electronically Signed   By: Ashley   On: 05/14/2015 08:15   Dg Chest Port 1 View  05/13/2015  CLINICAL DATA:  Cough. EXAM: PORTABLE CHEST 1  VIEW COMPARISON:  April 15, 2015. FINDINGS: Stable cardiomediastinal silhouette. Tracheostomy tube, nasogastric tube and right internal jugular catheter have been removed. No pneumothorax is noted. Hypoinflation of the lungs is noted. Increased bibasilar opacities are noted concerning for atelectasis, infiltrate or edema. No significant pleural effusion is noted. Bony thorax is unremarkable. IMPRESSION: Support apparatus have been removed. Hypoinflation of the lungs is noted. Increased bibasilar opacities are noted concerning for atelectasis, infiltrates or edema. Electronically Signed   By: Marijo Conception, M.D.   On: 05/13/2015 10:31   STUDIES:  CXR with RLL infiltrate vs atelectasis.  ASSESSMENT / PLAN:  Attending Note:  40 year old autistic male who had a prolonged hospitalization requiring tracheostomy who was decannulated then sent to CIR. In CIR the patient developed right sided infiltrate and PCCM was consulted. On exam, lungs are clear but patient is on 3L [place]. Trach site clean. I reviewed CXR myself, developing RLL infiltrate. Patient is completely asymptomatic and denies any sputum production. Discussed with CIR extender.  Pulmonary infiltrate: - CXR in AM.  Atelectasis: - IS. - Ambulate. - Chest PT.  ?HCAP: - D/C levaquin. - Vanc. - Zosyn. - F/U on cultures.  Trach status: - Decannulated and doing well.  PCCM will follow.  Patient seen and examined, agree with above note. I dictated the care and orders written for this patient under my direction.  Rush Farmer, MD [phone]  05/14/2015, 1:47 PM

## 2015-05-14 NOTE — Progress Notes (Signed)
Occupational Therapy Session Note  Patient Details  Name: Juan RipperRobert Cameron MRN: [id number] Date of Birth: 1975/04/26  Today's Date: 05/14/2015 OT Individual Time: 1100-1155 OT Individual Time Calculation (min): 55 min    Short Term Goals: Week 1:  OT Short Term Goal 1 (Week 1): STG=LTG due to LOS  Skilled Therapeutic Interventions/Progress Updates:    1:1 self care retraining at sink level (pt's choice). Focus on sit to stand, activity tolerance, standing balance and endurance etc. Pt on 3 liters of O2 during session and required VC for pursed lip breathing. Pt did have one episode of desating to 84% on 3 liters. Pt able to recover with a few min of rest. Pt able to maintain sats >90% on 3 liters during the rest of the session; even with functional ambulation in the hall with RW. Pt required min A for balance and to recover from LOB with turning due to turning too quickly. Pt required multiple rest breaks throughout session but able to complete bathing and dressing tasks at supervision min A for more dynamic tasks. Pt perform UE exercise with 2 lb weighted bar for continued endurance training.   Therapy Documentation Precautions:  Precautions Precautions: Fall Restrictions Weight Bearing Restrictions: No Pain:  no c/o pain  See Function Navigator for Current Functional Status.   Therapy/Group: Individual Therapy  Roney MansSmith, Mariaisabel Bodiford [facility] 05/14/2015, 11:39 AM

## 2015-05-15 ENCOUNTER — Inpatient Hospital Stay (HOSPITAL_COMMUNITY): Payer: Medicare Other

## 2015-05-15 ENCOUNTER — Inpatient Hospital Stay (HOSPITAL_COMMUNITY): Payer: Medicare Other | Admitting: Physical Therapy

## 2015-05-15 ENCOUNTER — Inpatient Hospital Stay (HOSPITAL_COMMUNITY): Payer: Medicare Other | Admitting: Speech Pathology

## 2015-05-15 DIAGNOSIS — J189 Pneumonia, unspecified organism: Secondary | ICD-10-CM | POA: Insufficient documentation

## 2015-05-15 DIAGNOSIS — R0902 Hypoxemia: Secondary | ICD-10-CM | POA: Insufficient documentation

## 2015-05-15 DIAGNOSIS — J810 Acute pulmonary edema: Secondary | ICD-10-CM | POA: Insufficient documentation

## 2015-05-15 LAB — CBC WITH DIFFERENTIAL/PLATELET
BASOS ABS: 0 10*3/uL (ref 0.0–0.1)
Basophils Relative: 0 %
EOS PCT: 6 %
Eosinophils Absolute: 0.6 10*3/uL (ref 0.0–0.7)
HEMATOCRIT: 27.6 % — AB (ref 39.0–52.0)
Hemoglobin: 9.1 g/dL — ABNORMAL LOW (ref 13.0–17.0)
LYMPHS ABS: 2.4 10*3/uL (ref 0.7–4.0)
LYMPHS PCT: 22 %
MCH: 27.6 pg (ref 26.0–34.0)
MCHC: 33 g/dL (ref 30.0–36.0)
MCV: 83.6 fL (ref 78.0–100.0)
MONOS PCT: 11 %
Monocytes Absolute: 1.2 10*3/uL — ABNORMAL HIGH (ref 0.1–1.0)
NEUTROS PCT: 61 %
Neutro Abs: 6.6 10*3/uL (ref 1.7–7.7)
Platelets: 242 10*3/uL (ref 150–400)
RBC: 3.3 MIL/uL — AB (ref 4.22–5.81)
RDW: 16.1 % — AB (ref 11.5–15.5)
WBC: 10.8 10*3/uL — AB (ref 4.0–10.5)

## 2015-05-15 LAB — URINE CULTURE

## 2015-05-15 MED ORDER — PIPERACILLIN-TAZOBACTAM 3.375 G IVPB 30 MIN
3.3750 g | Freq: Once | INTRAVENOUS | Status: AC
  Administered 2015-05-15: 3.375 g via INTRAVENOUS
  Filled 2015-05-15: qty 50

## 2015-05-15 MED ORDER — PIPERACILLIN-TAZOBACTAM 3.375 G IVPB
3.3750 g | Freq: Three times a day (TID) | INTRAVENOUS | Status: DC
  Administered 2015-05-15 – 2015-05-16 (×2): 3.375 g via INTRAVENOUS
  Filled 2015-05-15 (×4): qty 50

## 2015-05-15 MED ORDER — SODIUM CHLORIDE 0.9% FLUSH: 10.0000 mL | INTRAVENOUS | Status: DC | PRN

## 2015-05-15 MED ORDER — VANCOMYCIN HCL 10 G IV SOLR
1500.0000 mg | Freq: Once | INTRAVENOUS | Status: AC
  Administered 2015-05-15: 1500 mg via INTRAVENOUS
  Filled 2015-05-15: qty 1500

## 2015-05-15 MED ORDER — VANCOMYCIN HCL IN DEXTROSE 1-5 GM/200ML-% IV SOLN
1000.0000 mg | Freq: Two times a day (BID) | INTRAVENOUS | Status: DC
  Administered 2015-05-16: 1000 mg via INTRAVENOUS
  Filled 2015-05-15 (×2): qty 200

## 2015-05-15 NOTE — Progress Notes (Signed)
Pharmacy Antibiotic Note  Juan Cameron is a 40 y.o. male admitted on 05/06/2015 with pneumonia.  Pharmacy was consulted for Vancomycin and Zosyn dosing on 05/14/15.Marland Kitchen.  Pt with R-sided infiltrate on CXR and recent prolonged hospitalization for multilobar pna and respiratory failure.  Levels during March admission during AKI requiring HD, which has now resolved.  [facility] admission to CIR, and he did not receive Vanc during that time.  Pharmacy informed that the patient did not have IV access until today. PICC line was just placed.  No IV antibiotics have been given yet.  Scr stabel at 1.14, CrCl ~ 98 ml/min WBC 10.8K   Plan: Vancomycin 1.5g x1 now Vancomycin 1000mg  IV q12h Zosyn 3.375g IV q8h, infuse over 4hr Check Vanc trough at steady state Watch renal fxn and f/u any cx data  Height: 5\' 9"  (175.3 cm) Weight: 202 lb (91.627 kg) IBW/kg (Calculated) : 70.7  Temp (24hrs), Avg:98.5 F (36.9 C), Min:98.5 F (36.9 C), Max:98.5 F (36.9 C)   Recent Labs Lab 05/12/15 1142 05/14/15 0544 05/15/15 0442  WBC 12.4* 12.2* 10.8*  CREATININE 1.14  --   --     Estimated Creatinine Clearance: 98.3 mL/min (by C-G formula based on Cr of 1.14).    Allergies  Allergen Reactions  . Peanuts [Peanut Oil] Anaphylaxis    Antimicrobials this admission: Vanc  5/3 no IV access til 5/4 >>  Zosyn 5/3 no IV access til 5/4 >>   Microbiology results: 5/2 UCx:  Multiple species present, suggest recollection   Thank you for allowing pharmacy to be a part of this patient's care.  Noah Delaineuth Juel Bellerose, RPh Clinical Pharmacist Pager: [phone] 05/15/2015 3:15 PM

## 2015-05-15 NOTE — Progress Notes (Signed)
Since renal issues are no longer a concern IV Team will insert PICC.   Attempted to get consent from the sister and Aunt however not getting an answer for PICC insertion.   Will attempt again later.  Primary RN made aware.

## 2015-05-15 NOTE — Progress Notes (Signed)
Peripherally Inserted Central Catheter/Midline Placement  The IV Nurse has discussed with the patient and/or persons authorized to consent for the patient, the purpose of this procedure and the potential benefits and risks involved with this procedure.  The benefits include less needle sticks, lab draws from the catheter and patient may be discharged home with the catheter.  Risks include, but not limited to, infection, bleeding, blood clot (thrombus formation), and puncture of an artery; nerve damage and irregular heat beat.  Alternatives to this procedure were also discussed.  PICC/Midline Placement Documentation     Information given to [facility] , sister.   Franne Gripewman, Keyston Ardolino Renee 05/15/2015, 1:41 PM

## 2015-05-15 NOTE — Progress Notes (Signed)
Occupational Therapy Session Note  Patient Details  Name: Juan RipperRobert Shiffer MRN: [id number] Date of Birth: 06-03-1975  Today's Date: 05/15/2015 OT Individual Time: 0930-1015 OT Individual Time Calculation (min): 45 min    Short Term Goals: Week 1:  OT Short Term Goal 1 (Week 1): STG=LTG due to LOS  Skilled Therapeutic Interventions/Progress Updates: ADL-retraining at sink with focus on improved dynamic standing balance and  sit<>stand to doff and don underwear and pants.   Pt able to perform bathing at sink with overall supervision to progress, setup for supplies, extra time to rest d/t decline in cardiorespiratory endurance (now on 3L 02).   Pt required frequent redirection during this session d/t perseveration over oxygen monitor and need for assist with managing supplemental oxygen and supplies (limited clean clothing).   Pt's performed task slower and lacked attention to task throughout session.   02 sats maintained at >91% although HR increased to 136 after dressing while standing at sink.     Therapy Documentation Precautions:  Precautions Precautions: Fall Restrictions Weight Bearing Restrictions: No  Vital Signs: Oxygen Therapy SpO2: 96 % O2 Device: Nasal Cannula O2 Flow Rate (L/min): 3 L/min   Pain: Pain Assessment Pain Assessment: No/denies pain  See Function Navigator for Current Functional Status.   Therapy/Group: Individual Therapy  Vasiliy Mccarry 05/15/2015, 12:20 PM

## 2015-05-15 NOTE — Progress Notes (Signed)
[place] PHYSICAL MEDICINE & REHABILITATION     PROGRESS NOTE  Subjective/Complaints:  Bed again watching the because of the UE. When he sees me walking in the door, stenosis side closes eyes and does not reopen throughout the remainder of the exam.  ROS: Denies pain but otherwise does not cooperate with ROS  Objective: Vital Signs: Blood pressure 101/51, pulse 104, temperature 98.5 F (36.9 C), temperature source Oral, resp. rate 18, height 5\' 9"  (1.753 m), weight 91.627 kg (202 lb), SpO2 96 %. Dg Chest 2 View  05/14/2015  CLINICAL DATA:  Shortness of breath.  Cough. EXAM: CHEST  2 VIEW COMPARISON:  05/13/2015. FINDINGS: Mediastinum and hilar structures normal. Progressive diffuse bilateral pulmonary infiltrates. Heart size normal. No pleural effusion or pneumothorax. IMPRESSION: 1. Diffuse bilateral pulmonary infiltrates, progressive from prior exam. 2.  Heart size stable . Electronically Signed   By: Maisie Fus  Register   On: 05/14/2015 08:15   Dg Chest Port 1 View  05/15/2015  CLINICAL DATA:  Acute respiratory failure, community-acquired pneumonia, ARDS. EXAM: PORTABLE CHEST 1 VIEW COMPARISON:  PA and lateral chest x-ray of May 14, 2015 FINDINGS: The lungs are slightly less well inflated today. There are persistent coarse lung markings with confluent alveolar opacities at the lung bases. Small amounts of pleural fluid blunt the costophrenic angles. The cardiac silhouette is enlarged. The pulmonary vascularity is engorged and indistinct. The observed bony thorax exhibits no acute abnormality. IMPRESSION: CHF with mild pulmonary interstitial edema. Bibasilar infiltrates compatible with pneumonia. Electronically Signed   By: David  [place] M.D.   On: 05/15/2015 07:53    Recent Labs  05/14/15 0544 05/15/15 0442  WBC 12.2* 10.8*  HGB 8.6* 9.1*  HCT 26.5* 27.6*  PLT 277 242    Recent Labs  05/12/15 1142  NA 142  K 4.1  CL 108  GLUCOSE 105*  BUN 10  CREATININE 1.14  CALCIUM 9.2   CBG  (last 3)  No results for input(s): GLUCAP in the last 72 hours.  Wt Readings from Last 3 Encounters:  05/15/15 91.627 kg (202 lb)  04/07/15 104.8 kg (231 lb 0.7 oz)  03/07/15 102.694 kg (226 lb 6.4 oz)    Physical Exam:  BP 101/51 mmHg  Pulse 104  Temp(Src) 98.5 F (36.9 C) (Oral)  Resp 18  Ht 5\' 9"  (1.753 m)  Wt 91.627 kg (202 lb)  BMI 29.82 kg/m2  SpO2 96% Constitutional: NAD. Vital signs reviewed. Well-developed, well-nourished. He requires significant encouragement to participate HENT: Normocephalic. Atraumatic Eyes: Conjunctivae and EOM are normal.   Cardiovascular: Regular rate and rhythm no murmur   Respiratory: Not allowing her lung exam today. Previously Effort normal. No respiratory distress.  GI: Soft. Bowel sounds are normal. He exhibits no distension.  Musculoskeletal: refuses Neurological: He is alert and previously ?Oriented 2.  Patient will make limited eye contact with examiner.  Difficult to engage.  Refusing MMT Previously:  Right upper extremity 5/5 proximal distal Left upper extremity: 4+/5 proximal to distal Right lower extremity: Hip flexion, knee extension 4/5, ankle dorsi/plantarflexion 5/5 Left lower extremity: Hip flexion, knee extension? 4/5, ankle dorsi/plantarflexion 5/5 Skin:  Trach site healed.  Psychiatric:  Very quiet. Reluctant to engage.  Assessment/Plan: 1. Functional deficits secondary to debility which require 3+ hours per day of interdisciplinary therapy in a comprehensive inpatient rehab setting. Physiatrist is providing close team supervision and 24 hour management of active medical problems listed below. Physiatrist and rehab team continue to assess barriers to discharge/monitor patient progress  toward functional and medical goals.  Function:  Bathing Bathing position   Position: Wheelchair/chair at sink  Bathing parts Body parts bathed by patient: Right arm, Left arm, Chest, Abdomen, Front perineal area, Buttocks, Right  upper leg, Left upper leg, Right lower leg, Left lower leg Body parts bathed by helper: Back  Bathing assist Assist Level: Set up   Set up : To obtain items  Upper Body Dressing/Undressing Upper body dressing   What is the patient wearing?: Pull over shirt/dress     Pull over shirt/dress - Perfomed by patient: Thread/unthread right sleeve, Thread/unthread left sleeve, Put head through opening, Pull shirt over trunk          Upper body assist Assist Level: Set up, Supervision or verbal cues   Set up : To obtain clothing/put away  Lower Body Dressing/Undressing Lower body dressing   What is the patient wearing?: Underwear, Pants, Socks, Shoes Underwear - Performed by patient: Thread/unthread right underwear leg, Thread/unthread left underwear leg, Pull underwear up/down Underwear - Performed by helper: Pull underwear up/down Pants- Performed by patient: Thread/unthread right pants leg, Thread/unthread left pants leg, Pull pants up/down, Fasten/unfasten pants Pants- Performed by helper: Pull pants up/down Non-skid slipper socks- Performed by patient: Don/doff left sock, Don/doff right sock   Socks - Performed by patient: Don/doff right sock, Don/doff left sock   Shoes - Performed by patient: Don/doff right shoe, Don/doff left shoe, Fasten right, Fasten left            Lower body assist Assist for lower body dressing: Set up   Set up : To obtain clothing/put away  Toileting Toileting Toileting activity did not occur: Refused Toileting steps completed by patient: Adjust clothing prior to toileting, Performs perineal hygiene, Adjust clothing after toileting Toileting steps completed by helper: Adjust clothing prior to toileting Toileting Assistive Devices: Grab bar or rail  Toileting assist Assist level: Touching or steadying assistance (Pt.75%)   Transfers Chair/bed transfer   Chair/bed transfer method: Ambulatory Chair/bed transfer assist level: Touching or steadying  assistance (Pt > 75%) Chair/bed transfer assistive device: Armrests     Locomotion Ambulation     Max distance: 60 Assist level: Touching or steadying assistance (Pt > 75%)   Wheelchair   Type: Manual Max wheelchair distance: 155ft Assist Level: Supervision or verbal cues  Cognition Comprehension Comprehension assist level: Understands basic 90% of the time/cues < 10% of the time  Expression Expression assist level: Expresses basic 75 - 89% of the time/requires cueing 10 - 24% of the time. Needs helper to occlude trach/needs to repeat words.  Social Interaction Social Interaction assist level: Interacts appropriately 50 - 74% of the time - May be physically or verbally inappropriate.  Problem Solving Problem solving assist level: Solves basic 75 - 89% of the time/requires cueing 10 - 24% of the time  Memory Memory assist level: Recognizes or recalls 75 - 89% of the time/requires cueing 10 - 24% of the time    Medical Problem List and Plan: 1. Debilitation secondary to respiratory failure/ARDS/multilobar pneumonia /encephalopathy -Continue CIR 2. DVT Prophylaxis/Anticoagulation: SCDs/ambulate. Monitor for any signs of DVT 3. Pain Management: Tylenol 4. Mood/autism/mentally impaired:   Klonopin 0.5 mg daily at bedtime  Provigil 100 mg daily, wean to 50 mg on 5/3 5. Neuropsych: This patient is not capable of making decisions on his own behalf. 6. Skin/Wound Care: Routine skin checks 7. Fluids/Electrolytes/Nutrition: Routine I/Os  8. Ventilatory dependent respiratory failure/tracheostomy. Decannulation 04/25/2015 9. Acute kidney failure. Resolved. Patient  did receive dialysis for a short time. Cr normalized. 10. Vancomycin resistant enterococci urinary tract infection. Treated. Continue contact precautions 11. Tachycardia/atrial fibrillation.   Continue Cardizem 60 mg every 8 hours,   Lopressor 25 mg twice a day, increased to 50 on 4/27, increased to 75 on 4/30  Will  continue to monitor, slightly improved 12. History of sickle cell trait: Continue to monitor 13. Leukocytosis: Improved  WBCs 10.8 on 5/4  CXR reviewed on 5/3 and again on 5/4, suggesting infiltrates  Levaquin 750mg  changed to Vanc and Zosyn on 5/3  Appreciate Pulm consult 14. Mild hypokalemia: Resolved  Potassium 4.1 on 5/1 15. Right knee pain  Muscle rub cream ordered  LOS (Days) 9 A FACE TO FACE EVALUATION WAS PERFORMED  Ankit Karis Jubanil Patel 05/15/2015 10:56 AM

## 2015-05-15 NOTE — Progress Notes (Signed)
Physical Therapy Weekly Progress Note  Patient Details  Name: Juan Cameron MRN: [id number] Date of Birth: Jun 15, 1975  Beginning of progress report period: May 07, 2015 End of progress report period: May 15, 2015  Today's Date: 05/15/2015 PT Individual Time: 8333-8329 PT Individual Time Calculation (min): 44 min   Patient has met 3 of 3 short term goals.   Patient continues to demonstrate the following deficits: Strength, balance, endurance, safety, and therefore will continue to benefit from skilled PT intervention to enhance overall performance with activity tolerance, balance, postural control, attention and awareness.  Patient progressing toward long term goals..  Continue plan of care.  PT Short Term Goals Week 1:  PT Short Term Goal 1 (Week 1): Pt will perform squat/stand pivot transfers with steady A. PT Short Term Goal 1 - Progress (Week 1): Met PT Short Term Goal 2 (Week 1): Pt will ambulate 50 ft with steady A & LRAD. PT Short Term Goal 2 - Progress (Week 1): Met PT Short Term Goal 3 (Week 1): Pt will transfer sit<>stand with steady A. PT Short Term Goal 3 - Progress (Week 1): Met Week 2:  PT Short Term Goal 1 (Week 2): LTG = STG due to discharge date.   Skilled Therapeutic Interventions/Progress Updates:    Patient received supine in bed and agreeable to PT treatment after PT explained the importance of continual movement to make progress towards goals.  Patient performed bed mobility without assistance from PT.   Sit stand transfer with min A for improved safety folloed by gait [facility] and min A from PT.  Patient performed WC mobility with BLE propulsion and  min A from PT due to reported weakness in LE. PT was required to provided moderate cues to increase participation from PT.   Patient engaged in standing tolerance/balance training [place]. Patient remained standing throughout entire treatment game  without complaints of LE weakness or pain. Patient performed sitting balance training with Wii bowling with function reach and pertubaitons x 8 minutes sitting in WC without UE support.   Patient performed WC mobility back to room for 1527fwith supervision A from PT with using BLE propulsion with min cues for improved sequencing of movements and improved erect posture.   Patient performed WC>bed transfer with supervision A from PT and was left supine in bed with call bell within reach.   Therapy Documentation Precautions:  Precautions Precautions: Fall Restrictions Weight Bearing Restrictions: No General:   Vital Signs: Therapy Vitals Temp: 98.6 F (37 C) Temp Source: Oral Pulse Rate: (!) 101 Resp: 18 BP: 114/68 mmHg Patient Position (if appropriate): Sitting Oxygen Therapy SpO2: 98 % O2 Device: Nasal Cannula O2 Flow Rate (L/min): 3 L/min Pain: Pain Assessment Pain Assessment: No/denies pain See Function Navigator for Current Functional Status.  Therapy/Group: Individual Therapy  AuLorie Phenix/04/2015, 5:30 PM

## 2015-05-15 NOTE — Progress Notes (Signed)
Social Work Patient ID: Juan Cameron, male   DOB: [birth year], 40 y.o.   MRN: [id number] Juan FlesherWent to check on pt he reports he is doing ok and nodded his head no if he wanted anything. Left him watching his tv show.

## 2015-05-15 NOTE — Evaluation (Addendum)
Speech Language Pathology Assessment and Plan  Patient Details  Name: Juan Cameron MRN: [id number] Date of Birth: 1975/11/23  SLP Diagnosis: Dysphagia  Rehab Potential: Good ELOS: anticipated d/c of 5/10    Today's Date: 05/15/2015 SLP Individual Time: 0800-0900 SLP Individual Time Calculation (min): 60 min   Problem List:  Patient Active Problem List   Diagnosis Date Noted  . Pneumonia   . Acute pulmonary edema (HCC)   . Hypoxemia   . Sepsis ([place])   . Cough   . Right knee pain   . Cognitive deficits   . Tachycardia   . Debility 05/06/2015  . Encephalopathy 05/06/2015  . 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 Ventilator dependence ([place]) 04/08/2015  . Pressure ulcer 04/05/2015  . Acute respiratory failure ([place])   . Fever 03/21/2015  . Acute renal insufficiency 03/21/2015  . Obesity 03/21/2015  . ARDS (adult respiratory distress syndrome) ([place])   . HCAP (healthcare-associated pneumonia) 03/11/2015  . Leukocytosis 03/11/2015  . Acute hyperglycemia 03/11/2015  . Left ventricular diastolic dysfunction, NYHA class 2 03/11/2015  . Thrombocytopenia ([place]) 03/11/2015  . Acute lung injury 03/10/2015  . Acute respiratory failure with hypoxia ([place]) 02/28/2015  . CAP (community acquired pneumonia) 02/28/2015  . Hypokalemia 02/28/2015  . Autism 02/28/2015   Past Medical History:  Past Medical History  Diagnosis Date  . Autism   . Sickle cell trait ([place])   . Pneumonia 02/2015  . Autism    Past Surgical History:  Past Surgical History  Procedure Laterality Date  . No past surgeries      Assessment / Plan / Recommendation Clinical Impression  40 year old right handed African-American male with past medical history significant for autism with element of attention deficit hyperactivity disorder as well as sickle cell trait. Patient lives with his sister and attends adult daycare workshop daily. Patient originally admitted to [facility] for multilobar pneumonia and  discharged to home on 03/10/2015 still using 4 L of oxygen. He was admitted back to the hospital 04/07/2015 with respiratory failure and ARDS. Patient did require intubation placed on broad-spectrum antibiotics. Required tracheostomy 03/20/2015 and after failed extubation [facility]2015.  Decannulated 04/25/2015. His diet was slowly advanced to a regular consistency.   Patient was admitted for a comprehensive rehabilitation program.  SLP evaluation ordered due to reports of difficulty swallowing pills and worsening chest x ray.  Pt is now requiring supplemental oxygen due to desaturations at rest and during activity.  SLP evaluation completed on 05/15/2015 with the following results: Pt presents with grossly intact oropharyngeal swallowing function.  No overt s/s of aspiration were evident with solids or liquids despite consumption of large consecutive boluses of thins via straw.  Pt demonstrated swift laryngeal elevation per palpation.  Mastication was effective and timely and no residual solids were left in the oral cavity post swallow.  No focal oral motor deficits evident upon exam.  Despite minimal dysphagia on evaluation, pt is at risk of airway compromise during PO intake given two factors: 1.) significant deconditioning with respiratory involvement which could lead to decreased endurance during meals given apneic period during swallowing and 2.) baseline cognitive impairment impacting pt's ability to consistently use universal swallowing precautions to compensate for weakness and maximize safety during meals (i.e. Pt was laying down eating a brownie and watching TV upon SLP's arrival).  Given the abovementioned factors, pt would benefit from skilled ST while inpatient to implement a respiratory muscle training program  and provide education regarding the importance of maintaining good swallowing safety habits.    Skilled Therapeutic Interventions           SLP completed respiratory muscle training evaluation during today's session.  Pt's peak MIP trial was 28 cm H20.  Peak MEP trial was 51 cm H20.  Both trials are indicative of meaningful weakness given pt's age.  As a result, pt would benefit from implementation of RMT program to improve endurance for swallowing function.  Will discuss with P.A. For approval prior to initiation of treatment.  Pt left in bed with call bell within reach and bed alarm set.    SLP Assessment  Patient will need skilled Speech Lanaguage Pathology Services during CIR admission    Recommendations  SLP Diet Recommendations: Thin;Age appropriate regular solids Liquid Administration via: Cup;Straw Medication Administration: Crushed with puree Supervision: Patient able to self feed Postural Changes and/or Swallow Maneuvers: Seated upright 90 degrees;Out of bed for meals Oral Care Recommendations: Oral care BID Patient destination: Home Follow up Recommendations: Other (comment) (TBD) Equipment Recommended: None recommended by SLP    SLP Frequency 1 to 3 out of 7 days   SLP Duration  SLP Intensity  SLP Treatment/Interventions anticipated d/c of 5/10  Minumum of 1-2 x/day, 30 to 90 minutes  Cueing hierarchy;Dysphagia/aspiration precaution training;Patient/family education;Internal/external aids;Therapeutic Exercise    Pain Pain Assessment Pain Assessment: No/denies pain  Prior Functioning Type of Home: House  Lives With: Family Available Help at Discharge: Family Vocation: Other (comment) (adult day care )  Function:  Eating Eating   Modified Consistency Diet: No Eating Assist Level: Supervision or verbal cues   Eating Set Up Assist For: Opening containers       Cognition Comprehension Comprehension assist level: Follows basic conversation/direction with extra time/assistive device  Expression   Expression assist level: Expresses basic 75 - 89% of the time/requires cueing 10 - 24% of the time.  Needs helper to occlude trach/needs to repeat words.  Social Interaction Social Interaction assist level: Interacts appropriately 50 - 74% of the time - May be physically or verbally inappropriate.  Problem Solving Problem solving assist level: Solves basic 50 - 74% of the time/requires cueing 25 - 49% of the time  Memory Memory assist level: Recognizes or recalls 75 - 89% of the time/requires cueing 10 - 24% of the time   Short Term Goals: Week 1: SLP Short Term Goal 1 (Week 1): Pt will consume regular textures and thin liquids with mod I use of safe swallowing strategies.   SLP Short Term Goal 2 (Week 1): Pt will verbalize 2 standard safe swallowing strategies with supervision question cues.    Refer to Care Plan for Long Term Goals  Recommendations for other services: None  Discharge Criteria: Patient will be discharged from SLP if patient refuses treatment 3 consecutive times without medical reason, if treatment goals not met, if there is a change in medical status, if patient makes no progress towards goals or if patient is discharged from hospital.  The above assessment, treatment plan, treatment alternatives and goals were discussed and mutually agreed upon: by patient  Emilio Math 05/15/2015, 4:18 PM

## 2015-05-15 NOTE — Progress Notes (Signed)
Physical Therapy Session Note  Patient Details  Name: Juan Cameron MRN: [id number] Date of Birth: 1976/01/09  Today's Date: 05/15/2015 PT Individual Time: 1115-1138 AND 1415 -1459 PT Individual Time Calculation (min): 23 min   Short Term Goals: Week 1:  PT Short Term Goal 1 (Week 1): Pt will perform squat/stand pivot transfers with steady A. PT Short Term Goal 2 (Week 1): Pt will ambulate 50 ft with steady A & LRAD. PT Short Term Goal 3 (Week 1): Pt will transfer sit<>stand with steady A.  Skilled Therapeutic Interventions/Progress Updates:    Patient received supine in bed and not agreeable to PT. PT educated patient on the importance of increased activity and pulmonary hygiene and patient agreed to ambulation. Patient noted to be on 3L of O2 at 88% resting and desat to 84% with bed mobility and putting on shoes. He was instructed in pusred lip breathing to improve activation of diaphragm and encouraged to maintain improved posture to expand lung. SpO2 improved to 96 % with diaphragmatic breathing on 3L O2/min PT instructed patient in gait training without Assistive device for 46f with min-Mod A from PT. Patient took standing rest break at nurses station then returned to room for 551fusing rail in hall for support and min A from PT. Patient performed bed mobility to supine position without cues from PT. SpO2 measured at 89% following gait training. Patient refused any more PT due to head ache and LE weakness.  Patient left in bed with all needs met.   Therapy Documentation Precautions:  Precautions Precautions: Fall Restrictions Weight Bearing Restrictions: No General:   Vital Signs: Oxygen Therapy SpO2: 96 % O2 Device: Nasal Cannula O2 Flow Rate (L/min): 3 L/min Pain: Pain Assessment Pain Assessment: No/denies pain   See Function Navigator for Current Functional Status.   Therapy/Group: Individual Therapy  AuLorie Phenix/04/2015, 1:01 PM

## 2015-05-15 NOTE — Progress Notes (Signed)
Name: Juan RipperRobert Cameron MRN: [id number] DOB: 03-01-1975    ADMISSION DATE:  05/06/2015 CONSULTATION DATE:  05/14/15  REFERRING MD :  Dr. Allena KatzPatel   CHIEF COMPLAINT:  Evaluate for Infiltrates    HISTORY OF PRESENT ILLNESS:  40 y/o M with PMH of sickle cell trait, PNA and autism with recent prolonged hospitalization for multilobar PNA with ARDS, AKI requiring HD (resolved, off HD), prolonged respiratory failure s/p tracheostomy.  [facility].  [facility]    SUBJECTIVE: No event overnight, O2 demand the same.  VITAL SIGNS: Temp:  [98.5 F (36.9 C)-98.6 F (37 C)] 98.6 F (37 C) (05/04 1550) Pulse Rate:  [98-117] 101 (05/04 1550) Resp:  [18] 18 (05/04 1550) BP: (101-123)/(51-78) 114/68 mmHg (05/04 1550) SpO2:  [77 %-98 %] 98 % (05/04 1550) Weight:  [91.627 kg (202 lb)] 91.627 kg (202 lb) (05/04 0547)  PHYSICAL EXAMINATION: General:  Well appearing, NAD. Neuro:  Alert and interactive, moving all ext to command. HEENT:  [place]/AT, PERRL, EOM-I and MMM. Cardiovascular:  RRR, Nl S1/S2, -M/R/G. Lungs:  CTA bilaterally. Abdomen:  Soft, NT, ND and +BS. Musculoskeletal:  -edema and -tenderness. Skin:  Intact.   Recent Labs Lab 05/12/15 1142  NA 142  K 4.1  CL 108  CO2 23  BUN 10  CREATININE 1.14  GLUCOSE 105*    Recent Labs Lab 05/12/15 1142 05/14/15 0544 05/15/15 0442  HGB 9.6* 8.6* 9.1*  HCT 29.8* 26.5* 27.6*  WBC 12.4* 12.2* 10.8*  PLT 312 277 242   Dg Chest 2 View  05/14/2015  CLINICAL DATA:  Shortness of breath.  Cough. EXAM: CHEST  2 VIEW COMPARISON:  05/13/2015. FINDINGS: Mediastinum and hilar structures normal. Progressive diffuse bilateral pulmonary infiltrates. Heart size normal. No pleural effusion or pneumothorax. IMPRESSION: 1. Diffuse bilateral pulmonary infiltrates, progressive from prior exam. 2.  Heart size stable . Electronically  Signed   By: Maisie Fushomas  Register   On: 05/14/2015 08:15   Dg Chest Port 1 View  05/15/2015  CLINICAL DATA:  Acute respiratory failure, community-acquired pneumonia, ARDS. EXAM: PORTABLE CHEST 1 VIEW COMPARISON:  PA and lateral chest x-ray of May 14, 2015 FINDINGS: The lungs are slightly less well inflated today. There are persistent coarse lung markings with confluent alveolar opacities at the lung bases. Small amounts of pleural fluid blunt the costophrenic angles. The cardiac silhouette is enlarged. The pulmonary vascularity is engorged and indistinct. The observed bony thorax exhibits no acute abnormality. IMPRESSION: CHF with mild pulmonary interstitial edema. Bibasilar infiltrates compatible with pneumonia. Electronically Signed   By: David  SwazilandJordan M.D.   On: 05/15/2015 07:53   STUDIES:  CXR with RLL infiltrate vs atelectasis.  ASSESSMENT / PLAN:  Attending Note:  40 year old autistic male who had a prolonged hospitalization requiring tracheostomy who was decannulated then sent to CIR. In CIR the patient developed right sided infiltrate and PCCM was consulted. On exam, lungs are clear but patient is on 3L [place]. Trach site clean. I reviewed CXR myself, developing RLL infiltrate. Patient is completely asymptomatic and denies any sputum production. Discussed with CIR extender.  Pulmonary infiltrate: - Monitor.  Atelectasis: - IS. - Ambulate. - Chest PT.  ?HCAP: not likely given procalcitonin <0.1, no fever and no WBC. - D/C levaquin. - D/C vanc. - D/C Zosyn. - F/U on cultures.  Trach status: - Decannulated and doing well.  PCCM will sign off, please call back if needed.  Alyson Reedy, M.D. [facility] Pulmonary/Critical Care Medicine. Pager: [phone]. After hours pager: [phone].   05/15/2015, 4:05 PM

## 2015-05-15 NOTE — Progress Notes (Signed)
Spoke with primary RN regarding PICC insertion.    Due to renal issues his central lines are inserted by Interventional Radiology.  She will notify MD.

## 2015-05-16 ENCOUNTER — Inpatient Hospital Stay (HOSPITAL_COMMUNITY): Payer: Medicare Other | Admitting: Physical Therapy

## 2015-05-16 ENCOUNTER — Encounter (HOSPITAL_COMMUNITY): Payer: Self-pay | Admitting: Certified Registered Nurse Anesthetist

## 2015-05-16 ENCOUNTER — Inpatient Hospital Stay (HOSPITAL_COMMUNITY)
Admission: AD | Admit: 2015-05-16 | Discharge: 2015-06-10 | DRG: 870 | Disposition: A | Discharge status: Rehabilitation Facility | Payer: Medicare Other | Source: Ambulatory Visit | Attending: Internal Medicine | Admitting: Internal Medicine

## 2015-05-16 ENCOUNTER — Inpatient Hospital Stay (HOSPITAL_COMMUNITY): Payer: Medicare Other

## 2015-05-16 ENCOUNTER — Inpatient Hospital Stay (HOSPITAL_COMMUNITY): Payer: Medicare Other | Admitting: Speech Pathology

## 2015-05-16 DIAGNOSIS — I10 Essential (primary) hypertension: Secondary | ICD-10-CM | POA: Insufficient documentation

## 2015-05-16 DIAGNOSIS — Z794 Long term (current) use of insulin: Secondary | ICD-10-CM

## 2015-05-16 DIAGNOSIS — Z91010 Allergy to peanuts: Secondary | ICD-10-CM | POA: Diagnosis not present

## 2015-05-16 DIAGNOSIS — G9340 Encephalopathy, unspecified: Secondary | ICD-10-CM | POA: Diagnosis not present

## 2015-05-16 DIAGNOSIS — D638 Anemia in other chronic diseases classified elsewhere: Secondary | ICD-10-CM | POA: Diagnosis present

## 2015-05-16 DIAGNOSIS — R6521 Severe sepsis with septic shock: Secondary | ICD-10-CM | POA: Diagnosis present

## 2015-05-16 DIAGNOSIS — D6489 Other specified anemias: Secondary | ICD-10-CM | POA: Diagnosis present

## 2015-05-16 DIAGNOSIS — N289 Disorder of kidney and ureter, unspecified: Secondary | ICD-10-CM

## 2015-05-16 DIAGNOSIS — R0902 Hypoxemia: Secondary | ICD-10-CM

## 2015-05-16 DIAGNOSIS — D573 Sickle-cell trait: Secondary | ICD-10-CM | POA: Diagnosis present

## 2015-05-16 DIAGNOSIS — R1319 Other dysphagia: Secondary | ICD-10-CM | POA: Diagnosis present

## 2015-05-16 DIAGNOSIS — I5032 Chronic diastolic (congestive) heart failure: Secondary | ICD-10-CM | POA: Diagnosis present

## 2015-05-16 DIAGNOSIS — F909 Attention-deficit hyperactivity disorder, unspecified type: Secondary | ICD-10-CM | POA: Diagnosis present

## 2015-05-16 DIAGNOSIS — G9341 Metabolic encephalopathy: Secondary | ICD-10-CM | POA: Diagnosis present

## 2015-05-16 DIAGNOSIS — S27309A Unspecified injury of lung, unspecified, initial encounter: Secondary | ICD-10-CM | POA: Diagnosis not present

## 2015-05-16 DIAGNOSIS — K567 Ileus, unspecified: Secondary | ICD-10-CM | POA: Diagnosis not present

## 2015-05-16 DIAGNOSIS — J810 Acute pulmonary edema: Secondary | ICD-10-CM

## 2015-05-16 DIAGNOSIS — T380X5A Adverse effect of glucocorticoids and synthetic analogues, initial encounter: Secondary | ICD-10-CM | POA: Diagnosis present

## 2015-05-16 DIAGNOSIS — J9601 Acute respiratory failure with hypoxia: Secondary | ICD-10-CM

## 2015-05-16 DIAGNOSIS — N179 Acute kidney failure, unspecified: Secondary | ICD-10-CM | POA: Diagnosis present

## 2015-05-16 DIAGNOSIS — A419 Sepsis, unspecified organism: Principal | ICD-10-CM | POA: Diagnosis present

## 2015-05-16 DIAGNOSIS — Y95 Nosocomial condition: Secondary | ICD-10-CM | POA: Diagnosis present

## 2015-05-16 DIAGNOSIS — D509 Iron deficiency anemia, unspecified: Secondary | ICD-10-CM | POA: Diagnosis present

## 2015-05-16 DIAGNOSIS — R05 Cough: Secondary | ICD-10-CM | POA: Diagnosis not present

## 2015-05-16 DIAGNOSIS — J189 Pneumonia, unspecified organism: Secondary | ICD-10-CM

## 2015-05-16 DIAGNOSIS — Z22322 Carrier or suspected carrier of Methicillin resistant Staphylococcus aureus: Secondary | ICD-10-CM | POA: Diagnosis not present

## 2015-05-16 DIAGNOSIS — Z79899 Other long term (current) drug therapy: Secondary | ICD-10-CM

## 2015-05-16 DIAGNOSIS — N189 Chronic kidney disease, unspecified: Secondary | ICD-10-CM | POA: Diagnosis present

## 2015-05-16 DIAGNOSIS — J15212 Pneumonia due to Methicillin resistant Staphylococcus aureus: Secondary | ICD-10-CM | POA: Diagnosis present

## 2015-05-16 DIAGNOSIS — E870 Hyperosmolality and hypernatremia: Secondary | ICD-10-CM | POA: Diagnosis present

## 2015-05-16 DIAGNOSIS — J9621 Acute and chronic respiratory failure with hypoxia: Secondary | ICD-10-CM | POA: Diagnosis present

## 2015-05-16 DIAGNOSIS — F419 Anxiety disorder, unspecified: Secondary | ICD-10-CM | POA: Diagnosis present

## 2015-05-16 DIAGNOSIS — J80 Acute respiratory distress syndrome: Secondary | ICD-10-CM | POA: Diagnosis not present

## 2015-05-16 DIAGNOSIS — R001 Bradycardia, unspecified: Secondary | ICD-10-CM | POA: Diagnosis not present

## 2015-05-16 DIAGNOSIS — F329 Major depressive disorder, single episode, unspecified: Secondary | ICD-10-CM | POA: Diagnosis present

## 2015-05-16 DIAGNOSIS — D72829 Elevated white blood cell count, unspecified: Secondary | ICD-10-CM | POA: Diagnosis present

## 2015-05-16 DIAGNOSIS — E876 Hypokalemia: Secondary | ICD-10-CM | POA: Diagnosis present

## 2015-05-16 DIAGNOSIS — J9610 Chronic respiratory failure, unspecified whether with hypoxia or hypercapnia: Secondary | ICD-10-CM | POA: Diagnosis present

## 2015-05-16 DIAGNOSIS — J9620 Acute and chronic respiratory failure, unspecified whether with hypoxia or hypercapnia: Secondary | ICD-10-CM | POA: Diagnosis not present

## 2015-05-16 DIAGNOSIS — R109 Unspecified abdominal pain: Secondary | ICD-10-CM

## 2015-05-16 DIAGNOSIS — Z4659 Encounter for fitting and adjustment of other gastrointestinal appliance and device: Secondary | ICD-10-CM

## 2015-05-16 DIAGNOSIS — F840 Autistic disorder: Secondary | ICD-10-CM | POA: Diagnosis not present

## 2015-05-16 DIAGNOSIS — R0602 Shortness of breath: Secondary | ICD-10-CM | POA: Insufficient documentation

## 2015-05-16 DIAGNOSIS — K5900 Constipation, unspecified: Secondary | ICD-10-CM | POA: Diagnosis present

## 2015-05-16 DIAGNOSIS — R700 Elevated erythrocyte sedimentation rate: Secondary | ICD-10-CM | POA: Diagnosis present

## 2015-05-16 DIAGNOSIS — D649 Anemia, unspecified: Secondary | ICD-10-CM | POA: Diagnosis present

## 2015-05-16 DIAGNOSIS — R739 Hyperglycemia, unspecified: Secondary | ICD-10-CM | POA: Diagnosis present

## 2015-05-16 DIAGNOSIS — R5381 Other malaise: Secondary | ICD-10-CM | POA: Diagnosis not present

## 2015-05-16 DIAGNOSIS — R4189 Other symptoms and signs involving cognitive functions and awareness: Secondary | ICD-10-CM | POA: Diagnosis present

## 2015-05-16 DIAGNOSIS — I4891 Unspecified atrial fibrillation: Secondary | ICD-10-CM | POA: Diagnosis present

## 2015-05-16 DIAGNOSIS — Z23 Encounter for immunization: Secondary | ICD-10-CM | POA: Diagnosis not present

## 2015-05-16 DIAGNOSIS — R000 Tachycardia, unspecified: Secondary | ICD-10-CM | POA: Diagnosis present

## 2015-05-16 DIAGNOSIS — D62 Acute posthemorrhagic anemia: Secondary | ICD-10-CM | POA: Diagnosis not present

## 2015-05-16 DIAGNOSIS — R451 Restlessness and agitation: Secondary | ICD-10-CM | POA: Diagnosis not present

## 2015-05-16 DIAGNOSIS — J9600 Acute respiratory failure, unspecified whether with hypoxia or hypercapnia: Secondary | ICD-10-CM

## 2015-05-16 DIAGNOSIS — J9690 Respiratory failure, unspecified, unspecified whether with hypoxia or hypercapnia: Secondary | ICD-10-CM

## 2015-05-16 DIAGNOSIS — R0600 Dyspnea, unspecified: Secondary | ICD-10-CM

## 2015-05-16 DIAGNOSIS — R1310 Dysphagia, unspecified: Secondary | ICD-10-CM | POA: Diagnosis not present

## 2015-05-16 DIAGNOSIS — Z9289 Personal history of other medical treatment: Secondary | ICD-10-CM

## 2015-05-16 LAB — CBC WITH DIFFERENTIAL/PLATELET
Basophils Absolute: 0 10*3/uL (ref 0.0–0.1)
Basophils Relative: 0 %
EOS PCT: 3 %
Eosinophils Absolute: 0.5 10*3/uL (ref 0.0–0.7)
HEMATOCRIT: 25.4 % — AB (ref 39.0–52.0)
Hemoglobin: 8.3 g/dL — ABNORMAL LOW (ref 13.0–17.0)
LYMPHS ABS: 2.4 10*3/uL (ref 0.7–4.0)
Lymphocytes Relative: 15 %
MCH: 26.6 pg (ref 26.0–34.0)
MCHC: 32.7 g/dL (ref 30.0–36.0)
MCV: 81.4 fL (ref 78.0–100.0)
MONO ABS: 1.3 10*3/uL — AB (ref 0.1–1.0)
MONOS PCT: 8 %
NEUTROS ABS: 12.1 10*3/uL — AB (ref 1.7–7.7)
Neutrophils Relative %: 74 %
Platelets: 263 10*3/uL (ref 150–400)
RBC: 3.12 MIL/uL — ABNORMAL LOW (ref 4.22–5.81)
RDW: 15.6 % — AB (ref 11.5–15.5)
WBC: 16.3 10*3/uL — ABNORMAL HIGH (ref 4.0–10.5)

## 2015-05-16 LAB — PROCALCITONIN
Procalcitonin: <0.1 ng/mL
Procalcitonin: 1.2 ng/mL

## 2015-05-16 MED ORDER — CETYLPYRIDINIUM CHLORIDE 0.05 % MT LIQD
7.0000 mL | Freq: Two times a day (BID) | OROMUCOSAL | Status: DC
  Administered 2015-05-17 – 2015-05-20 (×7): 7 mL via OROMUCOSAL

## 2015-05-16 MED ORDER — METOPROLOL TARTRATE 50 MG PO TABS
75.0000 mg | ORAL_TABLET | Freq: Two times a day (BID) | ORAL | Status: DC
  Filled 2015-05-16: qty 1

## 2015-05-16 MED ORDER — DILTIAZEM HCL 60 MG PO TABS
60.0000 mg | ORAL_TABLET | Freq: Three times a day (TID) | ORAL | Status: DC
  Administered 2015-05-17 – 2015-05-20 (×9): 60 mg via ORAL
  Filled 2015-05-16 (×12): qty 1

## 2015-05-16 MED ORDER — PIPERACILLIN-TAZOBACTAM 3.375 G IVPB
3.3750 g | Freq: Three times a day (TID) | INTRAVENOUS | Status: DC
  Administered 2015-05-16 – 2015-05-19 (×9): 3.375 g via INTRAVENOUS
  Filled 2015-05-16 (×11): qty 50

## 2015-05-16 MED ORDER — VANCOMYCIN HCL IN DEXTROSE 1-5 GM/200ML-% IV SOLN
1000.0000 mg | Freq: Three times a day (TID) | INTRAVENOUS | Status: DC
  Administered 2015-05-16 – 2015-05-17 (×5): 1000 mg via INTRAVENOUS
  Filled 2015-05-16 (×7): qty 200

## 2015-05-16 MED ORDER — FAMOTIDINE 20 MG PO TABS
20.0000 mg | ORAL_TABLET | Freq: Two times a day (BID) | ORAL | Status: DC
  Filled 2015-05-16: qty 1

## 2015-05-16 MED ORDER — DOCUSATE SODIUM 100 MG PO CAPS
100.0000 mg | ORAL_CAPSULE | Freq: Two times a day (BID) | ORAL | Status: DC
  Filled 2015-05-16: qty 1

## 2015-05-16 MED ORDER — CLONAZEPAM 0.5 MG PO TABS
0.5000 mg | ORAL_TABLET | Freq: Every day | ORAL | Status: DC
  Administered 2015-05-16 – 2015-05-18 (×3): 0.5 mg via ORAL
  Filled 2015-05-16 (×4): qty 1

## 2015-05-16 MED ORDER — FUROSEMIDE 10 MG/ML IJ SOLN: 40.0000 mg | Freq: Once | INTRAMUSCULAR | Status: DC

## 2015-05-16 MED ORDER — SODIUM CHLORIDE 0.9% FLUSH
3.0000 mL | Freq: Two times a day (BID) | INTRAVENOUS | Status: DC
  Administered 2015-05-16 – 2015-05-29 (×14): 3 mL via INTRAVENOUS

## 2015-05-16 MED ORDER — SODIUM CHLORIDE 0.9 % IV SOLN
INTRAVENOUS | Status: DC
  Administered 2015-05-16 – 2015-05-18 (×2): via INTRAVENOUS

## 2015-05-16 MED ORDER — IPRATROPIUM-ALBUTEROL 0.5-2.5 (3) MG/3ML IN SOLN
3.0000 mL | Freq: Four times a day (QID) | RESPIRATORY_TRACT | Status: DC
  Administered 2015-05-16 (×2): 3 mL via RESPIRATORY_TRACT
  Filled 2015-05-16: qty 3

## 2015-05-16 MED ORDER — HEPARIN SODIUM (PORCINE) 5000 UNIT/ML IJ SOLN
5000.0000 [IU] | Freq: Three times a day (TID) | INTRAMUSCULAR | Status: DC
  Administered 2015-05-16 – 2015-06-10 (×67): 5000 [IU] via SUBCUTANEOUS
  Filled 2015-05-16 (×73): qty 1

## 2015-05-16 MED ORDER — DOCUSATE SODIUM 100 MG PO CAPS
100.0000 mg | ORAL_CAPSULE | Freq: Two times a day (BID) | ORAL | Status: DC
  Administered 2015-05-18 – 2015-05-22 (×3): 100 mg via ORAL
  Filled 2015-05-16 (×8): qty 1

## 2015-05-16 MED ORDER — MODAFINIL 100 MG PO TABS: 50.0000 mg | ORAL_TABLET | Freq: Every day | ORAL | Status: DC

## 2015-05-16 MED ORDER — HEPARIN SODIUM (PORCINE) 5000 UNIT/ML IJ SOLN
5000.0000 [IU] | Freq: Three times a day (TID) | INTRAMUSCULAR | Status: DC
  Filled 2015-05-16: qty 1

## 2015-05-16 MED ORDER — CLONAZEPAM 0.5 MG PO TABS: 0.5000 mg | ORAL_TABLET | Freq: Every day | ORAL | Status: DC

## 2015-05-16 MED ORDER — METOPROLOL TARTRATE 50 MG PO TABS
75.0000 mg | ORAL_TABLET | Freq: Two times a day (BID) | ORAL | Status: DC
  Administered 2015-05-16 – 2015-05-17 (×3): 75 mg via ORAL
  Filled 2015-05-16 (×3): qty 1

## 2015-05-16 MED ORDER — IPRATROPIUM-ALBUTEROL 0.5-2.5 (3) MG/3ML IN SOLN
RESPIRATORY_TRACT | Status: AC
  Filled 2015-05-16: qty 3

## 2015-05-16 MED ORDER — FUROSEMIDE 10 MG/ML IJ SOLN
40.0000 mg | Freq: Once | INTRAMUSCULAR | Status: AC
  Administered 2015-05-16: 40 mg via INTRAVENOUS
  Filled 2015-05-16: qty 4

## 2015-05-16 MED ORDER — FAMOTIDINE 20 MG PO TABS
20.0000 mg | ORAL_TABLET | Freq: Two times a day (BID) | ORAL | Status: DC
  Administered 2015-05-16 – 2015-05-20 (×8): 20 mg via ORAL
  Filled 2015-05-16 (×8): qty 1

## 2015-05-16 MED ORDER — MODAFINIL 100 MG PO TABS
50.0000 mg | ORAL_TABLET | Freq: Every day | ORAL | Status: DC
  Administered 2015-05-17 – 2015-05-20 (×4): 50 mg via ORAL
  Filled 2015-05-16 (×4): qty 1

## 2015-05-16 MED ORDER — PNEUMOCOCCAL VAC POLYVALENT 25 MCG/0.5ML IJ INJ
0.5000 mL | INJECTION | INTRAMUSCULAR | Status: AC
  Administered 2015-05-19: 0.5 mL via INTRAMUSCULAR
  Filled 2015-05-16: qty 0.5

## 2015-05-16 MED ORDER — IPRATROPIUM-ALBUTEROL 0.5-2.5 (3) MG/3ML IN SOLN
3.0000 mL | Freq: Four times a day (QID) | RESPIRATORY_TRACT | Status: DC
  Administered 2015-05-16: 3 mL via RESPIRATORY_TRACT
  Filled 2015-05-16: qty 3

## 2015-05-16 MED ORDER — IPRATROPIUM-ALBUTEROL 0.5-2.5 (3) MG/3ML IN SOLN
RESPIRATORY_TRACT | Status: AC
Start: 2015-05-16 — End: 2015-05-17
  Filled 2015-05-16: qty 3

## 2015-05-16 MED ORDER — VITAMIN D 1000 UNITS PO TABS: 1000.0000 [IU] | ORAL_TABLET | Freq: Every day | ORAL | Status: DC

## 2015-05-16 MED ORDER — ONDANSETRON HCL 4 MG/2ML IJ SOLN: 4.0000 mg | Freq: Four times a day (QID) | INTRAMUSCULAR | Status: DC | PRN

## 2015-05-16 MED ORDER — IPRATROPIUM-ALBUTEROL 0.5-2.5 (3) MG/3ML IN SOLN
3.0000 mL | Freq: Four times a day (QID) | RESPIRATORY_TRACT | Status: DC
  Administered 2015-05-17 – 2015-05-26 (×35): 3 mL via RESPIRATORY_TRACT
  Filled 2015-05-16 (×36): qty 3

## 2015-05-16 MED ORDER — DILTIAZEM HCL 60 MG PO TABS
60.0000 mg | ORAL_TABLET | Freq: Three times a day (TID) | ORAL | Status: DC
  Administered 2015-05-16: 60 mg via ORAL
  Filled 2015-05-16 (×2): qty 1

## 2015-05-16 NOTE — Progress Notes (Signed)
[place] given [place] [facility].  Will be transferring patient shortly.  Dani Gobbleeardon, Taeko Schaffer J, RN

## 2015-05-16 NOTE — Progress Notes (Signed)
Occupational Therapy Weekly Progress Note  Patient Details  Name: Juan Cameron MRN: [id number] Date of Birth: 06-Aug-1975  Beginning of progress report period: May 07, 2015 End of progress report period: May 16, 2015  Today's Date: 05/16/2015 OT Individual Time: 0730-0750 OT Individual Time Calculation (min): 20 min   Patient has made progress toward long term goals, achieving supervision level assist in all areas of BADL when he is motivated to participate however he continues to present psychosocial deficits limiting performance.  Patient able to progress through CIR admission as anticipated although admission is currently prolonged d/t decline in physical health with onset of pneumonia.   Pt to transfer to step-down unit for more intensive medical care.  OT Short Term Goals Week 1:  OT Short Term Goal 1 (Week 1): STG=LTG due to LOS  Skilled Therapeutic Interventions/Progress Updates: ADL-retraining with focus on improved activity tolerance.   Pt received supine in bed, HOB elevated with RN present and reporting recent cough and emesis.   Pt alert and responsive to therapist but initially refusing to engage in activity stating, "I'm sick" and turning from therapist, while coughing intermittently (coughing approx 1x/min).   OT encouraged food intake while sitting up at EOB but pt refused.   OT then provided setup for bathing at w/c or bed level and pt again refused any activity while avoiding eye contact with therapist.   OT attempted to inhibit distraction of television however pt responds with increased agitation that provokes increased coughing and immediate decline in 02 sats to unsafe level (81%).   Pt recovers his composure and responds to remotivations with promise to participate in planned afternoon session.   Therapy terminated d/t poor participation with decline in physical status.     Therapy Documentation Precautions:  Precautions Precautions: Fall Restrictions Weight Bearing  Restrictions: No   General: General OT Amount of Missed Time: 40 Minutes (sick: coughing w/low 02 sats)  Vital Signs: Therapy Vitals Temp: 98.3 F (36.8 C) Temp Source: Oral Pulse Rate: (!) 110 Resp: 18 BP: (!) 97/44 mmHg Patient Position (if appropriate): Lying Oxygen Therapy SpO2: (!) 81 % O2 Device: Nasal Cannula O2 Flow Rate (L/min): 3 L/min  Pain: Pain Assessment Pain Assessment: No/denies pain  See Function Navigator for Current Functional Status.   Therapy/Group: Individual Therapy   Atwell Mcdanel 05/16/2015, 8:14 AM

## 2015-05-16 NOTE — Discharge Summary (Signed)
Discharge summary job # [id number]

## 2015-05-16 NOTE — H&P (Signed)
PULMONARY / CRITICAL CARE MEDICINE   Name: Juan Cameron MRN: [id number] DOB: [birth year]    ADMISSION DATE: 05/16/2015 CONSULTATION DATE: 05/16/2015  REFERRING MD:  Rehab MD  CHIEF COMPLAINT: Respiratory Distress/ Hypoxemia  HISTORY OF PRESENT ILLNESS:   40 y/o M with PMH of sickle cell trait, PNA and autism with recent prolonged hospitalization for multilobar PNA with ARDS, AKI requiring HD (resolved, off HD), prolonged respiratory failure s/p tracheostomy. He was discharged to [facility] on 3/27 for further ventilator weaning. At [facility], he was He subsequently was decannulated and transferred to [facility] on 4/25.Am of 5/5 2017 pt developed progressive dry cough with acute desaturation into the 70's on 4L min [place], after an episode of emesis. ( ? Desaturation) Placed on 100 % mask,  saturations are now 100%, with decreased WOB. Pt. was given 1 dose of Lasix 40 mg IV, no void as of yet.   PAST MEDICAL HISTORY :  He  has a past medical history of Autism; Sickle cell trait ([place]); Pneumonia (02/2015); and Autism.  PAST SURGICAL HISTORY: He  has past surgical history that includes No past surgeries.  Allergies  Allergen Reactions  . Peanuts [Peanut Oil] Anaphylaxis    Current Facility-Administered Medications on File Prior to Encounter  Medication  . acetaminophen (TYLENOL) tablet 325-650 mg  . cholecalciferol (VITAMIN D) tablet 1,000 Units  . clonazePAM (KLONOPIN) tablet 0.5 mg  . diltiazem (CARDIZEM) tablet 60 mg  . famotidine (PEPCID) tablet 20 mg  . furosemide (LASIX) injection 40 mg  . guaiFENesin-dextromethorphan (ROBITUSSIN DM) 100-10 MG/5ML syrup 10 mL  . hydrocortisone cream 1 %  . ipratropium-albuterol (DUONEB) 0.5-2.5 (3) MG/3ML nebulizer solution 3 mL  . metoprolol tartrate (LOPRESSOR) tablet 75 mg  . modafinil (PROVIGIL) tablet 50 mg  . MUSCLE RUB CREA  . ondansetron (ZOFRAN) tablet 4 mg   Or  . ondansetron (ZOFRAN) injection 4 mg  . piperacillin-tazobactam (ZOSYN)  IVPB 3.375 g  . piperacillin-tazobactam (ZOSYN) IVPB 3.375 g  . sodium chloride flush (NS) 0.9 % injection 10-40 mL  . sorbitol 70 % solution 30 mL  . vancomycin (VANCOCIN) IVPB 1000 mg/200 mL premix   Current Outpatient Prescriptions on File Prior to Encounter  Medication Sig  . acetaminophen (TYLENOL) 160 MG/5ML solution Place 20.3 mLs (650 mg total) into feeding tube every 4 (four) hours as needed for mild pain, headache or fever.  Marland Kitchen albuterol (PROVENTIL) (2.5 MG/3ML) 0.083% nebulizer solution Take 2.5 mg by nebulization every 6 (six) hours as needed for wheezing or shortness of breath.  . Amino Acids-Protein Hydrolys (FEEDING SUPPLEMENT, PRO-STAT SUGAR FREE 64,) LIQD Place 30 mLs into feeding tube 2 (two) times daily.  . chlorhexidine gluconate, SAGE KIT, (PERIDEX) 0.12 % solution 15 mLs by Mouth Rinse route 2 (two) times daily.  . clonazePAM (KLONOPIN) 1 MG tablet Take 1 tablet (1 mg total) by mouth 2 (two) times daily.  Marland Kitchen docusate (COLACE) 50 MG/5ML liquid Place 10 mLs (100 mg total) into feeding tube daily.  . fentaNYL (SUBLIMAZE) 100 MCG/2ML injection Inject 0.5-1 mLs (25-50 mcg total) into the vein every 2 (two) hours as needed for severe pain.  . heparin 5000 UNIT/ML injection Inject 1 mL (5,000 Units total) into the skin every 8 (eight) hours.  . insulin aspart (NOVOLOG) 100 UNIT/ML injection Inject 2-6 Units into the skin every 4 (four) hours.  Marland Kitchen ipratropium-albuterol (DUONEB) 0.5-2.5 (3) MG/3ML SOLN Take 3 mLs by nebulization 3 (three) times daily.  . meropenem 500 mg in sodium chloride  0.9 % 50 mL Inject 500 mg into the vein daily.  . Nutritional Supplements (FEEDING SUPPLEMENT, VITAL HIGH PROTEIN,) LIQD liquid Place 1,000 mLs into feeding tube daily.  . pantoprazole sodium (PROTONIX) 40 mg/20 mL PACK Place 20 mLs (40 mg total) into feeding tube daily at 12 noon.  . polyethylene glycol (MIRALAX / GLYCOLAX) packet Take 17 g by mouth daily as needed for moderate constipation.     FAMILY HISTORY:  Patient has a sister,who lives in [place] and is his legal guardian. SOCIAL HISTORY: He  reports that he has never smoked. He has never used smokeless tobacco. He reports that he does not drink alcohol or use illicit drugs.  REVIEW OF SYSTEMS:    Unable to assess due to autism/ respiratory distress   SUBJECTIVE:  Awake and answers simple questions. Flat affect. He had been tolerating PO diet well, passed a swallow eval again on 5/4 but has been having trouble with pills.  He complained of some abdominal pain. Last BM 5/3  VITAL SIGNS: BP:97/44 / Pulse 110/ Temp: 98.3 F/  RR:18 /  SpO2: 100 HEMODYNAMICS:      INTAKE / OUTPUT:    PHYSICAL EXAMINATION: Physical Exam:  General- No distress,  A&Ox2, flat affect, autistic male ENT: No sinus tenderness, TM clear, pale nasal mucosa, no oral exudate,no post nasal drip, no LAN, no stridor Cardiac: S1, S2, regular rate and rhythm, no murmur Chest: No wheeze/ + rales/ dullness; diminished BS per bases, no accessory muscle use, no nasal flaring, no sternal retractions Abd.: Soft Non-tender, BS + Ext: No clubbing cyanosis, edema or tenderness Neuro:  normal strength Skin: No rashes, warm and dry, intact Psych: normal mood and behavior  LABS:  BMET  Recent Labs Lab 05/12/15 1142  NA 142  K 4.1  CL 108  CO2 23  BUN 10  CREATININE 1.14  GLUCOSE 105*    Electrolytes  Recent Labs Lab 05/12/15 1142  CALCIUM 9.2    CBC  Recent Labs Lab 05/12/15 1142 05/14/15 0544 05/15/15 0442  WBC 12.4* 12.2* 10.8*  HGB 9.6* 8.6* 9.1*  HCT 29.8* 26.5* 27.6*  PLT 312 277 242    Coag's No results for input(s): APTT, INR in the last 168 hours.  Sepsis Markers  Recent Labs Lab 05/14/15 1501 05/16/15 0500  PROCALCITON <0.10 <0.10    ABG No results for input(s): PHART, PCO2ART, PO2ART in the last 168 hours.  Liver Enzymes No results for input(s): AST, ALT, ALKPHOS, BILITOT, ALBUMIN in the last  168 hours.  Cardiac Enzymes No results for input(s): TROPONINI, PROBNP in the last 168 hours.  Glucose  Recent Labs Lab 05/11/15 1205  GLUCAP 100*    Imaging Dg Chest Port 1 View  05/16/2015  CLINICAL DATA:  Shortness of Breath with worsening today, cough EXAM: PORTABLE CHEST 1 VIEW COMPARISON:  05/15/2015 FINDINGS: Cardiomediastinal silhouette is stable. Persistent mild interstitial prominence suspicious for mild interstitial edema. Again noted bilateral basilar atelectasis or infiltrate. There is right arm PICC line with tip in right atrium. For distal SVC position the PICC line should be retracted about 2.1 cm. No pneumothorax. IMPRESSION: Persistent mild interstitial prominence suspicious for mild interstitial edema. Again noted bilateral basilar atelectasis or infiltrate. There is right arm PICC line with tip in right atrium. For distal SVC position the PICC line should be retracted about 2.1 cm. Electronically Signed   By: Lahoma Crocker M.D.   On: 05/16/2015 10:41     STUDIES:  03/10/2015 Echo: EF:  60-65%  CULTURES: Blood x 2   5/5 >> Urine  5/5 >>  ANTIBIOTICS: Vanc 5/5> Zosyn 5/5>  SIGNIFICANT EVENTS: 5/5 > worsening cough which provoked emesis event leading to desaturations and increased WOB. CXR 5/4 with  Interstitial edema and bilateral infiltrates. 5/5 CXR shows continued mild interstitial edema,bilateral basilar atelectasis or infiltrate LINES/TUBES: Right PICC 05/04>>  DISCUSSION: 40 year old autistic male who had a prolonged hospitalization requiring tracheostomy who was decannulated then sent to CIR. In CIR the patient developed right sided infiltrate and PCCM was consulted 5/3. Started on vanco / zosyn / levaquin; the levaquin was stopped 5/4. He has bilateral basilar infiltrates vs atx, dry cough. On 5/5 he developed desaturation to 70's, placed on 1.00 mask. He is awake, continues to cough w no sputum. Procalcitonin is <0.10, but empiric antibiotics started. Acute  on chronic hypoxemia with cough, concerning for HCAP vs aspiration pneumonitis/PNA:   ASSESSMENT / PLAN:  PULMONARY A: Acute on Chronic hypoxemia with cough concerning for HCAP vs. Pneumonitis/ PNA/ Atelectasis Aspiration Risk/ Respiratory compromise P:  Transfer to SDU  Titrate oxygen for sats  >92% Daily CXR Continue Empiric abx coverage  Cultures per ID Continue Vanc and Zosyn Monitor saturations Gentle diuresis Aspiration Precautions Ambulate Chest PT  CARDIOVASCULAR A:  Hemodynamically stable No acute issues P:  Tele monitoring    RENAL A:  No acute issues  P:   Trend BMET daily Replete electrolytes as needed Strict I&O Desire negative balance  GASTROINTESTINAL A:   Emesis event earlier this am Abdomen soft and slightly tender P:   NPO for now with respiratory decompensation Ok for sips and meds Antiemetic as needed for nausea  HEMATOLOGIC A:   VTE Prophylaxis Anemia  Sickle Cell Trait P:  SCD's / Heparin CBC in am Trend CBC Transfuse for HGB <7.   INFECTIOUS A:  Pneumonitis vs. Pulmonary edema with new bilateral infiltrates.       WBC 10.8 / Procalcitonin <0.10  P:   ABX as above ( Van/ Zosyn) Follow cultures as above ( Blood Culture/ Urine Culture) PCT algorithm to limit abx exposure. CBC daily Trend WBC/ Fever curve  ENDOCRINE A:  No Acute Issues   P:   Monitor serum glucose Add SSI if hyperglycemic  NEUROLOGIC A:   No acute issues P:   Monitor for AMS Neuro check every 4 hours  FAMILY  Family not at bedside/ sister was notified by phone of transfer   - Inter-disciplinary family meet or Palliative Care meeting due by:  May 23, 2015   Magdalen Spatz, AGACNP-BC [place] Pager: [phone]  05/16/2015, 12:06 PM

## 2015-05-16 NOTE — Progress Notes (Signed)
Have not been able to given patient is medications due to unable to get them out of Pyxis.  Pharmacy is aware.  After unsuccessful trouble shooting, waiting for pharmacy to send meds.

## 2015-05-16 NOTE — Progress Notes (Signed)
Pharmacy Antibiotic Note  Juan Cameron is a 40 y.o. male admitted on 05/16/2015 with pneumonia.  Pharmacy has been consulted for Zosyn and vancomycin dosing.  Day #3 of abx for HAP. Started on 5/3 but lost IV access until new line placed. Missed multiple doses over last couple days. Transferred from rehab to 3S. Pt with R-sided infiltrate on CXR and recent prolonged hospitalization for multilobar pna and respiratory failure. Afebrile, WBC down to 10.8. SCr 1/14 (on 5/1) CrCl ~4495ml/min.  Plan: Continue Zosyn 3.375 gm IV q8h (4 hour infusion) Continue vancomycin 1g IV Q8 Monitor clinical picture, renal function, VT at Css F/U C&S, abx deescalation / LOT Check Cmet in the am     Temp (24hrs), Avg:98.5 F (36.9 C), Min:98.3 F (36.8 C), Max:98.6 F (37 C)   Recent Labs Lab 05/12/15 1142 05/14/15 0544 05/15/15 0442  WBC 12.4* 12.2* 10.8*  CREATININE 1.14  --   --     Estimated Creatinine Clearance: 98.2 mL/min (by C-G formula based on Cr of 1.14).    Allergies  Allergen Reactions  . Peanuts [Peanut Oil] Anaphylaxis    Antimicrobials this admission: 5/2 Levaquin >> 5/3 5/3 Zosyn >> 5/3 Vancomycin >>  Dose adjustments this admission: n/a  Microbiology results: 5/2 Urine cx > multiple species  Thank you for allowing pharmacy to be a part of this patient's care.  Juan Cameron,Juan Cameron 05/16/2015 1:35 PM

## 2015-05-16 NOTE — Progress Notes (Signed)
Called patients sister, [name], to inform her of patients status this morning but unable to leave voicemail.  Will continue to try contacting her.  Dani Gobbleeardon, Haru Shaff J, RN

## 2015-05-16 NOTE — Progress Notes (Signed)
Name: Juan Cameron MRN: [id number] DOB: 01/13/1975    ADMISSION DATE:  05/06/2015 CONSULTATION DATE:  05/14/15  REFERRING MD :  Dr. Allena Katz   CHIEF COMPLAINT:  Evaluate for Infiltrates    HISTORY OF PRESENT ILLNESS:  40 y/o M with PMH of sickle cell trait, PNA and autism with recent prolonged hospitalization for multilobar PNA with ARDS, AKI requiring HD (resolved, off HD), prolonged respiratory failure s/p tracheostomy.  He was discharged to [facility] on 3/27 for further ventilator weaning.  At [facility], he was He subsequently was decannulated and transferred to [facility] on 4/25.    SUBJECTIVE:  Called to reassess pt this am after he experienced progression of dry cough and then acute desat to 70's on 4L/min [name]. He apparently also had an episode of emesis. He had been tolerating PO diet well, passed a swallow eval again on 5/4 but has been having trouble with pills. Placed on 1.00 mask. His WOB and SpO2 are improved now, but he continues to have cough. CXR is pending. He c/o some abd pain. Last BM 5/3  VITAL SIGNS: Temp:  [98.3 F (36.8 C)-98.6 F (37 C)] 98.3 F (36.8 C) (05/05 0522) Pulse Rate:  [99-111] 110 (05/05 0742) Resp:  [18-19] 18 (05/05 0522) BP: (97-123)/(44-68) 97/44 mmHg (05/05 0635) SpO2:  [81 %-99 %] 81 % (05/05 0742) Weight:  [91.536 kg (201 lb 12.8 oz)] 91.536 kg (201 lb 12.8 oz) (05/05 0522)  PHYSICAL EXAMINATION: General:  Well nourished, NAD. Appears sleepy Neuro:  Wakes to voice, quickly avoids any physical contact, does not follow commands, presumed due to his autism HEENT:  [place]/AT, PERRL, EOM-I and MMM. No stridor Cardiovascular:  RRR, Nl S1/S2, -M/R/G. Lungs:  Clear anteriorly, decreased B bases, no wheeze Abdomen:  Soft, NT, ND and +BS. Musculoskeletal:  -edema and -tenderness. Skin:  Intact.   Recent Labs Lab 05/12/15 1142  NA 142  K 4.1  CL 108  CO2 23  BUN 10  CREATININE 1.14  GLUCOSE 105*    Recent Labs Lab 05/12/15 1142 05/14/15 0544  05/15/15 0442  HGB 9.6* 8.6* 9.1*  HCT 29.8* 26.5* 27.6*  WBC 12.4* 12.2* 10.8*  PLT 312 277 242   Dg Chest Port 1 View  05/15/2015  CLINICAL DATA:  Acute respiratory failure, community-acquired pneumonia, ARDS. EXAM: PORTABLE CHEST 1 VIEW COMPARISON:  PA and lateral chest x-ray of May 14, 2015 FINDINGS: The lungs are slightly less well inflated today. There are persistent coarse lung markings with confluent alveolar opacities at the lung bases. Small amounts of pleural fluid blunt the costophrenic angles. The cardiac silhouette is enlarged. The pulmonary vascularity is engorged and indistinct. The observed bony thorax exhibits no acute abnormality. IMPRESSION: CHF with mild pulmonary interstitial edema. Bibasilar infiltrates compatible with pneumonia. Electronically Signed   By: David  [place] M.D.   On: 05/15/2015 07:53   STUDIES:  CXR 5/4 >> B basilar infiltrate vs atx CXR 5/5 >> pending  ASSESSMENT / PLAN:  Attending Note:  40 year old autistic male who had a prolonged hospitalization requiring tracheostomy who was decannulated then sent to CIR. In CIR the patient developed right sided infiltrate and PCCM was consulted 5/3. Started on vanco / zosyn / levaquin; the levaquin was stopped 5/4. He has bilateral basilar infiltrates vs atx, dry cough. On 5/5 he developed desaturation to 70's, placed on 1.00 mask. He is awake, continues to cough w no sputum.   Acute on chronic hypoxemia with cough, concerning for HCAP vs  aspiration pneumonitis/PNA:   - check CXR now  - agree with continued empiric abx given his clinical change, even in light of his low Pct  - pip/tazo + Vanco, levaquin d/c'd on 5/4  - ? Whether we are getting good SpO2 data, as he is moving and dislodging probe. Want to avoid ABG if possible but may be indicated as we move forward  - may require tx to SDU or ICU depending on progressions and CXR results.   Atelectasis:   - IS. - Ambulate. -  Chest PT.    Levy Pupaobert Anahla Bevis, MD, PhD 05/16/2015, 10:19 AM [facility] [phone] or if no answer [phone]

## 2015-05-16 NOTE — Progress Notes (Signed)
Patient noted to be coughing continuously with pulse oximetry reading alarming right at shift change.  Assisted patient to better sitting position in bed and rechecked pulse ox with different meter reading 70% on 3L O2.  Patient coughed until he vomited.  Says he "doesn't feel good."  Appears to be having difficulty clearing secretions.  PRN neb treatment given with good results and oxygen returning to mid 80s in a five minute timespan.  Patient replaced back on oxygen at end of treatment and patient seems to be breathing better.  Deatra Inaan Angiulli, PA notified, changed nebs from PRN to scheduled to help with symptoms since patient is unable to recognize he needs assistance due to baseline cognition.  Will continue to monitor closely.   Dani Gobbleeardon, Talbot Monarch J, RN

## 2015-05-16 NOTE — Progress Notes (Signed)
RT called to patients room due to patient's O2 saturations in the 70's on a 4L [place]. RT placed patient on a NRB. Patient has a continuous dry cough. PA on 4W is aware. CCM has been contacted. RT will continue to monitor.

## 2015-05-16 NOTE — Discharge Summary (Signed)
Juan Cameron, Juan NO.:  [id number]  MEDICAL RECORD NO.:  [id number]  LOCATION:  [id number]                        FACILITY:  [facility]  PHYSICIAN:  Maryla Morrow, MD        DATE OF BIRTH:  1975/12/12  DATE OF ADMISSION:  05/06/2015 DATE OF DISCHARGE:  05/16/2015                              DISCHARGE SUMMARY   DISCHARGE DIAGNOSES: 1. Acute on chronic hypoxemia. 2. SCDs for DVT prophylaxis. 3. Autistic. 4. Acute kidney failure. 5. Vancomycin-resistant enterococci urinary tract infection. 6. Tachycardia.  HISTORY OF PRESENT ILLNESS:  This is a 40 year old, right-handed [place] American male, with history of significant autism with element of attention deficit hyperactivity disorder as well as sickle cell trait. He lives with his sister, attends adult day care.  Independent prior to admission.  Originally, admitted to [facility] for multilobar pneumonia.  Discharged to home on March 10, 2015.  Still using 4 L of oxygen.  Readmitted on April 07, 2015, with respiratory failure, ARDS. Required intubation, broad-spectrum antibiotics.  Hospital course complicated by acute kidney injury.  Dialysis initiated for short time. Required tracheostomy on March 20, 2015.  Failed extubation, was transferred to [facility] for ongoing care on April 07, 2015.  He was ultimately decannulated on April 25, 2015, diet slowly advanced.  Bouts of tachycardia, atrial fibrillation, converted back to normal sinus rhythm, maintained on Cardizem and Lopressor.  Vancomycin- resistant enterococci, remained on contact precautions.  The patient was admitted for comprehensive rehab program.  PAST MEDICAL HISTORY:  See discharge diagnoses.  SOCIAL HISTORY:  Reported to be independent prior to admission.  Lives with his sister.  Functional status upon admission to rehab services: Mod to max assist, transfers; min to mod assist for activities of daily living.  PHYSICAL  EXAMINATION:  VITAL SIGNS:  Blood pressure 143/87, pulse 100, respirations 18, oxygen saturations 100%. GENERAL:  This was an alert male, limited eye contact with examiner. Uses simple 2 and 3 word phrases.  Difficult to engage. LUNGS:  Decreased breath sounds.  Clear to auscultation. CARDIAC:  Regular rate and rhythm.  No murmur. ABDOMEN:  Soft, nontender.  Good bowel sounds.  Tracheostomy site healing nicely.  REHABILITATION HOSPITAL COURSE:  The patient was admitted to inpatient rehab services with therapies initiated on a 3-hour daily basis, consisting of physical therapy, occupational therapy, speech therapy, and rehabilitation nursing.  The following issues were addressed during the patient's rehabilitation stay.  Pertaining to Mr. Sublett's acute respiratory failure, ARDS, multilobar pneumonia; he had recently been decannulated, followed closely by Pulmonary Services.  He was attending therapies.  Blood pressures remained controlled and monitored.  Elevated white blood cell count of 12,200, initially placed on Levaquin.  Chest x- ray from May 3 and May 15, 2015, suggesting infiltrates.  Pulmonary Services change antibiotics to vancomycin and Zosyn.  All issues in regard to his care were discussed with family.  On the morning of May 16, 2015, decreased oxygen saturations into the 70s, on 4 L of oxygen.  He also had an episode of emesis.  He recently had a swallowing evaluation, continued to be maintained on a regular diet.  A followup chest  x-ray had been completed showing some increased mild pulmonary interstitial edema, infiltrate compatible with pneumonia.  He remained on vancomycin and Zosyn.  Due to these ongoing medical changes, he was discharged to Acute Care Services for ongoing care.  Condition guarded at the time of discharge.  While on Rehab, the patient received weekly collaborative interdisciplinary therapies weekly.  The following issues continued to be followed.  The  patient ever propels his wheelchair with supervision. Needed some cuing for sequencing.  Performed wheelchair to bed transfers with supervision assist.  He could ambulate 10 feet to his wheelchair without assistive device.  Occupational therapy, he did require multiple rest break sessions for daily hygiene and care, dressing tasks were at supervision level.  Minimal assist for dynamic tasks.  At the time of discharge, all medication changes were made as per Pulmonary Services. At the time of discharge from the rehab unit, he did receive one dose of Lasix after chest x-ray had showed pulmonary edema, question CHF.     Juan Cameron, P.A.   ______________________________ Maryla MorrowAnkit Patel, MD    DA/MEDQ  D:  05/16/2015  T:  05/16/2015  Job:  [id number]  cc:   Leslye Peerobert S. Byrum, MD

## 2015-05-16 NOTE — Progress Notes (Signed)
Spoke with patients sister [name] about progress prior to Dr. Neville RouteByrums recommendations.  I told her we would keep her informed.  Asked Dr. Delton CoombesByrum to call [name] about plans.  Will be transferring patient off unit to step down unit today.  Will continue to monitor.  Dani Gobbleeardon, Karrissa Parchment J, RN

## 2015-05-16 NOTE — Progress Notes (Signed)
Social Work Patient ID: Alan Ripperobert Hice, male   DOB: December 30, 1975, 40 y.o.   MRN: [id number] Informed by dan-PA pt to transfer to acute due to medical issues. Team aware and have also informed AC. Pt's goals were supervision and were close to meeting these until medical issues occurred. MD to contact sister to inform Of the plan.

## 2015-05-16 NOTE — Progress Notes (Deleted)
RT removed patients trach with RN at bedside. Patient tolerated well without any apparent complications.

## 2015-05-16 NOTE — Progress Notes (Signed)
[facility] PHYSICAL MEDICINE & REHABILITATION     PROGRESS NOTE  Subjective/Complaints:  Patient seen sitting up in bed this morning watching to be WE. OT is trying to work with patient as well, but he is very cooperative.  ROS: Denies pain but otherwise does not cooperate with ROS  Objective: Vital Signs: Blood pressure 97/44, pulse 110, temperature 98.3 F (36.8 C), temperature source Oral, resp. rate 18, height 5\' 9"  (1.753 m), weight 91.536 kg (201 lb 12.8 oz), SpO2 81 %. Dg Chest Port 1 View  05/15/2015  CLINICAL DATA:  Acute respiratory failure, community-acquired pneumonia, ARDS. EXAM: PORTABLE CHEST 1 VIEW COMPARISON:  PA and lateral chest x-ray of May 14, 2015 FINDINGS: The lungs are slightly less well inflated today. There are persistent coarse lung markings with confluent alveolar opacities at the lung bases. Small amounts of pleural fluid blunt the costophrenic angles. The cardiac silhouette is enlarged. The pulmonary vascularity is engorged and indistinct. The observed bony thorax exhibits no acute abnormality. IMPRESSION: CHF with mild pulmonary interstitial edema. Bibasilar infiltrates compatible with pneumonia. Electronically Signed   By: David  [place] M.D.   On: 05/15/2015 07:53    Recent Labs  05/14/15 0544 05/15/15 0442  WBC 12.2* 10.8*  HGB 8.6* 9.1*  HCT 26.5* 27.6*  PLT 277 242   No results for input(s): NA, K, CL, GLUCOSE, BUN, CREATININE, CALCIUM in the last 72 hours.  Invalid input(s): CO CBG (last 3)  No results for input(s): GLUCAP in the last 72 hours.  Wt Readings from Last 3 Encounters:  05/16/15 91.536 kg (201 lb 12.8 oz)  04/07/15 104.8 kg (231 lb 0.7 oz)  03/07/15 102.694 kg (226 lb 6.4 oz)    Physical Exam:  BP 97/44 mmHg  Pulse 110  Temp(Src) 98.3 F (36.8 C) (Oral)  Resp 18  Ht 5\' 9"  (1.753 m)  Wt 91.536 kg (201 lb 12.8 oz)  BMI 29.79 kg/m2  SpO2 81% Constitutional: NAD. Vital signs reviewed. Well-developed, well-nourished. He requires  significant encouragement to participate HENT: Normocephalic. Atraumatic Eyes: Conjunctivae and EOM are normal.   Cardiovascular: Regular rate and rhythm no murmur  After maximal encouragement Respiratory: Limited exam, but clear to auscultation. Effort normal. No respiratory distress.  GI: Soft. Bowel sounds are normal. He exhibits no distension.  Musculoskeletal: refuses Neurological: He is alert and previously ?Oriented 2.  Difficult to engage.  Refusing MMT Previously:  Right upper extremity 5/5 proximal distal Left upper extremity: 4+/5 proximal to distal Right lower extremity: Hip flexion, knee extension 4/5, ankle dorsi/plantarflexion 5/5 Left lower extremity: Hip flexion, knee extension? 4/5, ankle dorsi/plantarflexion 5/5 Skin:  Trach site healed.  Psychiatric:  Very quiet. Reluctant to engage.  Assessment/Plan: 1. Functional deficits secondary to debility which require 3+ hours per day of interdisciplinary therapy in a comprehensive inpatient rehab setting. Physiatrist is providing close team supervision and 24 hour management of active medical problems listed below. Physiatrist and rehab team continue to assess barriers to discharge/monitor patient progress toward functional and medical goals.  Function:  Bathing Bathing position   Position: Wheelchair/chair at sink  Bathing parts Body parts bathed by patient: Right arm, Left arm, Abdomen, Chest, Front perineal area, Buttocks, Right upper leg, Left upper leg, Right lower leg, Left lower leg Body parts bathed by helper: Back  Bathing assist Assist Level: More than reasonable time   Set up : To obtain items  Upper Body Dressing/Undressing Upper body dressing   What is the patient wearing?: Pull over shirt/dress  Pull over shirt/dress - Perfomed by patient: Thread/unthread right sleeve, Thread/unthread left sleeve, Put head through opening, Pull shirt over trunk          Upper body assist Assist Level: Set  up, Supervision or verbal cues   Set up : To obtain clothing/put away  Lower Body Dressing/Undressing Lower body dressing   What is the patient wearing?: Underwear, Pants, Socks, Shoes Underwear - Performed by patient: Thread/unthread right underwear leg, Thread/unthread left underwear leg, Pull underwear up/down Underwear - Performed by helper: Pull underwear up/down Pants- Performed by patient: Thread/unthread right pants leg, Thread/unthread left pants leg, Pull pants up/down, Fasten/unfasten pants Pants- Performed by helper: Pull pants up/down Non-skid slipper socks- Performed by patient: Don/doff left sock, Don/doff right sock   Socks - Performed by patient: Don/doff right sock, Don/doff left sock   Shoes - Performed by patient: Don/doff right shoe, Don/doff left shoe, Fasten right, Fasten left            Lower body assist Assist for lower body dressing: Set up, Touching or steadying assistance (Pt > 75%)   Set up : To obtain clothing/put away  Toileting Toileting Toileting activity did not occur: Refused Toileting steps completed by patient: Adjust clothing prior to toileting, Performs perineal hygiene Toileting steps completed by helper: Adjust clothing prior to toileting Toileting Assistive Devices:  (urinal)  Toileting assist Assist level: Touching or steadying assistance (Pt.75%)   Transfers Chair/bed transfer   Chair/bed transfer method: Stand pivot Chair/bed transfer assist level: Touching or steadying assistance (Pt > 75%) Chair/bed transfer assistive device: Armrests, Bedrails     Locomotion Ambulation     Max distance: 8050ft Assist level: Touching or steadying assistance (Pt > 75%)   Wheelchair   Type: Manual Max wheelchair distance: 150  Assist Level: Supervision or verbal cues  Cognition Comprehension Comprehension assist level: Understands basic 50 - 74% of the time/ requires cueing 25 - 49% of the time  Expression Expression assist level: Expresses  basic 50 - 74% of the time/requires cueing 25 - 49% of the time. Needs to repeat parts of sentences.  Social Interaction Social Interaction assist level: Interacts appropriately 50 - 74% of the time - May be physically or verbally inappropriate.  Problem Solving Problem solving assist level: Solves basic 50 - 74% of the time/requires cueing 25 - 49% of the time  Memory Memory assist level: Recognizes or recalls 50 - 74% of the time/requires cueing 25 - 49% of the time    Medical Problem List and Plan: 1. Debilitation secondary to respiratory failure/ARDS/multilobar pneumonia /encephalopathy -Continue CIR 2. DVT Prophylaxis/Anticoagulation: SCDs/ambulate. Monitor for any signs of DVT 3. Pain Management: Tylenol 4. Mood/autism/mentally impaired:   Klonopin 0.5 mg daily at bedtime  Provigil 100 mg daily, wean to 50 mg on 5/3 5. Neuropsych: This patient is not capable of making decisions on his own behalf. 6. Skin/Wound Care: Routine skin checks 7. Fluids/Electrolytes/Nutrition: Routine I/Os  8. Ventilatory dependent respiratory failure/tracheostomy. Decannulation 04/25/2015 9. Acute kidney failure. Resolved. Patient did receive dialysis for a short time. Cr normalized. 10. Vancomycin resistant enterococci urinary tract infection. Treated. Continue contact precautions 11. Tachycardia/atrial fibrillation.   Continue Cardizem 60 mg every 8 hours,   Lopressor 25 mg twice a day, increased to 50 on 4/27, increased to 75 on 4/30  Will continue to monitor, slightly improved 12. History of sickle cell trait: Continue to monitor 13. Leukocytosis: Improved  WBCs 10.8 on 5/4  CXR reviewed on 5/3 and again on  5/4, suggesting infiltrates  Levaquin  changed to Vanc and Zosyn on 5/3, And DC'd on 5/4  Appreciate Pulm consult, antibiotics DC'd by pulmonary 14. Mild hypokalemia: Resolved  Potassium 4.1 on 5/1 15. Right knee pain  Muscle rub cream ordered 15. ?CHF  Suggested on chest  x-ray on 5/4  Will order diuretic, however will need to monitor heart rate and blood pressure  Continue supplemental O2 for time being  LOS (Days) 10 A FACE TO FACE EVALUATION WAS PERFORMED  Ankit Karis Juba 05/16/2015 9:52 AM

## 2015-05-17 ENCOUNTER — Inpatient Hospital Stay (HOSPITAL_COMMUNITY): Payer: Medicare Other

## 2015-05-17 DIAGNOSIS — N289 Disorder of kidney and ureter, unspecified: Secondary | ICD-10-CM

## 2015-05-17 DIAGNOSIS — J9601 Acute respiratory failure with hypoxia: Secondary | ICD-10-CM

## 2015-05-17 DIAGNOSIS — R4189 Other symptoms and signs involving cognitive functions and awareness: Secondary | ICD-10-CM

## 2015-05-17 DIAGNOSIS — F840 Autistic disorder: Secondary | ICD-10-CM

## 2015-05-17 DIAGNOSIS — J189 Pneumonia, unspecified organism: Secondary | ICD-10-CM

## 2015-05-17 LAB — PROCALCITONIN: Procalcitonin: 1.67 ng/mL

## 2015-05-17 LAB — COMPREHENSIVE METABOLIC PANEL
ALBUMIN: 2.5 g/dL — AB (ref 3.5–5.0)
ALT: 19 U/L (ref 17–63)
AST: 21 U/L (ref 15–41)
Alkaline Phosphatase: 57 U/L (ref 38–126)
Anion gap: 12 (ref 5–15)
BUN: 16 mg/dL (ref 6–20)
CHLORIDE: 100 mmol/L — AB (ref 101–111)
CO2: 26 mmol/L (ref 22–32)
Calcium: 8.7 mg/dL — ABNORMAL LOW (ref 8.9–10.3)
Creatinine, Ser: 1.62 mg/dL — ABNORMAL HIGH (ref 0.61–1.24)
GFR calc Af Amer: >60 mL/min (ref >60)
GFR calc non Af Amer: 52 mL/min — ABNORMAL LOW (ref >60)
GLUCOSE: 90 mg/dL (ref 65–99)
POTASSIUM: 3.9 mmol/L (ref 3.5–5.1)
SODIUM: 138 mmol/L (ref 135–145)
Total Bilirubin: 0.9 mg/dL (ref 0.3–1.2)
Total Protein: 5.9 g/dL — ABNORMAL LOW (ref 6.5–8.1)

## 2015-05-17 LAB — CBC
HEMATOCRIT: 25 % — AB (ref 39.0–52.0)
HEMOGLOBIN: 8.2 g/dL — AB (ref 13.0–17.0)
MCH: 27.4 pg (ref 26.0–34.0)
MCHC: 32.8 g/dL (ref 30.0–36.0)
MCV: 83.6 fL (ref 78.0–100.0)
Platelets: 259 10*3/uL (ref 150–400)
RBC: 2.99 MIL/uL — ABNORMAL LOW (ref 4.22–5.81)
RDW: 15.7 % — AB (ref 11.5–15.5)
WBC: 11.1 10*3/uL — AB (ref 4.0–10.5)

## 2015-05-17 MED ORDER — SODIUM CHLORIDE 0.9 % IV BOLUS (SEPSIS)
500.0000 mL | Freq: Once | INTRAVENOUS | Status: AC
  Administered 2015-05-17: 500 mL via INTRAVENOUS

## 2015-05-17 MED ORDER — GUAIFENESIN-DM 100-10 MG/5ML PO SYRP
10.0000 mL | ORAL_SOLUTION | ORAL | Status: DC | PRN
  Administered 2015-05-17 – 2015-05-20 (×4): 10 mL via ORAL
  Filled 2015-05-17 (×5): qty 10

## 2015-05-17 MED ORDER — SODIUM CHLORIDE 0.9 % IV BOLUS (SEPSIS)
1000.0000 mL | Freq: Once | INTRAVENOUS | Status: AC
  Administered 2015-05-17: 1000 mL via INTRAVENOUS

## 2015-05-17 MED ORDER — IBUPROFEN 200 MG PO TABS
400.0000 mg | ORAL_TABLET | Freq: Once | ORAL | Status: AC
  Administered 2015-05-17: 400 mg via ORAL
  Filled 2015-05-17: qty 2

## 2015-05-17 NOTE — Progress Notes (Signed)
Pyxis issue resolved, pharmacy re-entered every medication order. Will continue to monitor patient.

## 2015-05-17 NOTE — Progress Notes (Signed)
eLink Physician-Brief Progress Note Patient Name: Juan RipperRobert Cameron DOB: 1975-07-11 MRN: [id number]   Date of Service  05/17/2015  HPI/Events of Note  Hypotension with BP 83/43 and HR of 86.  eICU Interventions  Plan: 500 cc NS bolus for BP support     Intervention Category Intermediate Interventions: Hypotension - evaluation and management  Edmon Magid 05/17/2015, 5:12 AM

## 2015-05-17 NOTE — Progress Notes (Signed)
eLink Physician-Brief Progress Note Patient Name: Juan RipperRobert Cameron DOB: Jan 28, 1975 MRN: [id number]   Date of Service  05/17/2015  HPI/Events of Note  Mulitple issues: 1. Sinus Tachycardia - HR = 125. 2. Patient c/o headache. 3. Patient c/o cough.   eICU Interventions  Will order: 1. 0.9 NaCl 1 liter IV over 1 hour now. 2. Motrin 400 mg PO X 1.  3. Robitussin DM 10 mL PO Q 4 hours PRN.      Intervention Category Intermediate Interventions: Arrhythmia - evaluation and management  [name],Steven Eugene 05/17/2015, 7:01 PM

## 2015-05-17 NOTE — Progress Notes (Signed)
PULMONARY / CRITICAL CARE MEDICINE   Name: Juan RipperRobert Rozman MRN: [id number] DOB: 23-Jan-1975    ADMISSION DATE: 05/16/2015 CONSULTATION DATE: 05/16/2015  REFERRING MD:  Rehab MD  CHIEF COMPLAINT: Respiratory Distress/ Hypoxemia  HISTORY OF PRESENT ILLNESS:   40 y/o M with PMH of sickle cell trait, PNA and autism with recent prolonged hospitalization for multilobar PNA with ARDS, AKI requiring HD (resolved, off HD), prolonged respiratory failure s/p tracheostomy. [facility] further [facility]ator we[facility]. At [facility], he was He subsequently was decannulated and [facility].Am of 5/5 2017 pt developed progressive dry cough with acute desaturation into the 70's on 4L min [place], after an episode of emesis. ( ? Desaturation) Placed on 100 % mask,  saturations are now 100%, with decreased WOB. Pt. was given 1 dose of Lasix 40 mg IV, no void as of yet.   SUBJECTIVE:  Sleeping, discussed with nurse. He wants to eat He had been tolerating PO diet well, passed a swallow eval again on 5/4 but has been having trouble with pills.    HEMODYNAMICS:     INTAKE / OUTPUT: I/O last 3 completed shifts: In: 1493.7 [I.V.:243.7; IV Piggyback:1250] Out: 900 [Urine:900]  PHYSICAL EXAMINATION: Physical Exam: General- No distress,  A&Ox2, , autistic male, sleeping with light snore. I decided not to wake ENT: no stridor Cardiac: S1, S2, regular rate and rhythm, no murmur Chest: 94% sat on room air and unlabored, with nasal prongs on forehead Abd.: Soft Non-tender, BS + Ext: No clubbing cyanosis, edema or tenderness Neuro:  Not assessed Skin: No rashes, warm and dry, intact Psych: asleep  LABS:  BMET  Recent Labs Lab 05/12/15 1142 05/17/15 0455  NA 142 138  K 4.1 3.9  CL 108 100*  CO2 23 26  BUN 10 16  CREATININE 1.14 1.62*  GLUCOSE 105* 90    Electrolytes  Recent Labs Lab 05/12/15 1142 05/17/15 0455  CALCIUM 9.2 8.7*    CBC  Recent  Labs Lab 05/15/15 0442 05/16/15 1832 05/17/15 0455  WBC 10.8* 16.3* 11.1*  HGB 9.1* 8.3* 8.2*  HCT 27.6* 25.4* 25.0*  PLT 242 263 259    Coag's No results for input(s): APTT, INR in the last 168 hours.  Sepsis Markers  Recent Labs Lab 05/16/15 0500 05/16/15 1832 05/17/15 0455  PROCALCITON <0.10 1.20 1.67    ABG No results for input(s): PHART, PCO2ART, PO2ART in the last 168 hours.  Liver Enzymes  Recent Labs Lab 05/17/15 0455  AST 21  ALT 19  ALKPHOS 57  BILITOT 0.9  ALBUMIN 2.5*    Cardiac Enzymes No results for input(s): TROPONINI, PROBNP in the last 168 hours.  Glucose  Recent Labs Lab 05/11/15 1205  GLUCAP 100*    Imaging Dg Chest Port 1 View  05/16/2015  CLINICAL DATA:  Shortness of Breath with worsening today, cough EXAM: PORTABLE CHEST 1 VIEW COMPARISON:  05/15/2015 FINDINGS: Cardiomediastinal silhouette is stable. Persistent mild interstitial prominence suspicious for mild interstitial edema. Again noted bilateral basilar atelectasis or infiltrate. There is right arm PICC line with tip in right atrium. For distal SVC position the PICC line should be retracted about 2.1 cm. No pneumothorax. IMPRESSION: Persistent mild interstitial prominence suspicious for mild interstitial edema. Again noted bilateral basilar atelectasis or infiltrate. There is right arm PICC line with tip in right atrium. For distal SVC position the PICC line should be retracted about 2.1 cm. Electronically Signed   By: Lang SnowLiviu  Pop M.D.   On: 05/16/2015 10:41     STUDIES:  03/10/2015 Echo: EF: 60-65%  CULTURES: Sputum- none obtained Blood x 2   5/5 >> Urine  5/5 >> Urine 4/7- Enterococcus R to Vanc, S to Linezolid  ANTIBIOTICS: Vanc 5/5> Zosyn 5/5>  SIGNIFICANT EVENTS: 5/5 > worsening cough which provoked emesis event leading to desaturations and increased WOB. CXR 5/4 with  Interstitial edema and bilateral infiltrates. 5/5 CXR shows continued mild interstitial  edema,bilateral basilar atelectasis or infiltrate LINES/TUBES: Right PICC 05/04>>  DISCUSSION: 40 year old autistic male who had a prolonged hospitalization requiring tracheostomy who was decannulated then sent to CIR. In CIR the patient developed right sided infiltrate and PCCM was consulted 5/3. Started on vanco / zosyn / levaquin; the levaquin was stopped 5/4. He has bilateral basilar infiltrates vs atx, dry cough. On 5/5 he developed desaturation to 70's, placed on 1.00 mask. He is awake, continues to cough w no sputum. Procalcitonin initially <0.10, but empiric antibiotics started. Acute on chronic hypoxemia with cough, concerning for HCAP vs aspiration pneumonitis/PNA:   ASSESSMENT / PLAN:  PULMONARY A: Acute on Chronic hypoxemia with cough concerning for HCAP vs. Pneumonitis/ PNA/ Atelectasis Aspiration Risk/ Respiratory compromise Procalcitonin 1.67, WBC 5/5 16.3> 5/6 11.1 on VancZosyn Oxygenation improving P:  Titrate oxygen for sats  >92% Daily CXR Continue Empiric abx coverage  Cultures per ID Continue Vanc and Zosyn- need pharmacy dosing Vanc for rising creat Monitor saturations Gentle diuresis Aspiration Precautions Ambulate Chest PT  CARDIOVASCULAR A:  Hemodynamically stable No acute issues P:  Tele monitoring    RENAL Creatinine rising on vanc Positive I&O A: need pharma dosing  P:   Trend BMET daily Replete electrolytes as needed Strict I&O Desire negative balance-   GASTROINTESTINAL A:   Emesis event earlier this am Abdomen soft and slightly tender C/O hungry to nurse P:   Advance diet as tolerated. Aspiration precautions remain Antiemetic as needed for nausea  HEMATOLOGIC A:   VTE Prophylaxis Anemia  Sickle Cell Trait P:  SCD's / Heparin CBC in am Trend CBC Transfuse for HGB <7.   INFECTIOUS A:  Pneumonitis or/ and. Pulmonary edema with new bilateral infiltrates. Potentially aspiration pneumonia       WBC and Procalcitonin  up  P:   ABX as above ( Van/ Zosyn) Follow cultures as above ( Blood Culture/ Urine Culture) PCT algorithm to limit abx exposure. CBC daily Trend WBC/ Fever curve  ENDOCRINE A:  No Acute Issues   P:   Monitor serum glucose Add SSI if hyperglycemic  NEUROLOGIC A:   No acute issues P:   Monitor for AMS Neuro check every 4 hours  FAMILY    - Inter-disciplinary family meet or Palliative Care meeting due by:  May 23, 2015  Discussed with nurse 5/6. Advance diet as tol starting with clear liquids and maintaining aspiration precautions.   CD Maple Hudson, MD [facility] Pulmonary/Critical Care Medicine [facility] Pager: [phone]  05/17/2015, 8:32 AM

## 2015-05-17 NOTE — Progress Notes (Signed)
Pt complaining of a headache , pt also has a strong non productive cough. I called e-link and spoke with Lennice SitesJill RN. She stated she would talk to the MD in the office and get orders. Marisue Ivanobyn Kahner Yanik RN

## 2015-05-18 LAB — URINE CULTURE: Culture: NO GROWTH

## 2015-05-18 LAB — GLUCOSE, CAPILLARY: GLUCOSE-CAPILLARY: 89 mg/dL (ref 65–99)

## 2015-05-18 LAB — PROCALCITONIN: PROCALCITONIN: 0.88 ng/mL

## 2015-05-18 LAB — MRSA PCR SCREENING: MRSA BY PCR: POSITIVE — AB

## 2015-05-18 LAB — VANCOMYCIN, TROUGH: VANCOMYCIN TR: 57 ug/mL — AB (ref 10.0–20.0)

## 2015-05-18 MED ORDER — ACETAMINOPHEN 325 MG PO TABS
650.0000 mg | ORAL_TABLET | Freq: Four times a day (QID) | ORAL | Status: DC | PRN
  Administered 2015-05-18 – 2015-05-25 (×8): 650 mg via ORAL
  Filled 2015-05-18 (×8): qty 2

## 2015-05-18 MED ORDER — WHITE PETROLATUM GEL
Status: AC
  Filled 2015-05-18: qty 1

## 2015-05-18 MED ORDER — MUPIROCIN 2 % EX OINT
1.0000 "application " | TOPICAL_OINTMENT | Freq: Two times a day (BID) | CUTANEOUS | Status: AC
  Administered 2015-05-18 – 2015-05-22 (×10): 1 via NASAL
  Filled 2015-05-18 (×2): qty 22

## 2015-05-18 MED ORDER — ONDANSETRON HCL 4 MG/2ML IJ SOLN
4.0000 mg | Freq: Four times a day (QID) | INTRAMUSCULAR | Status: DC | PRN
  Administered 2015-05-18 – 2015-06-05 (×5): 4 mg via INTRAVENOUS
  Filled 2015-05-18 (×5): qty 2

## 2015-05-18 MED ORDER — CHLORHEXIDINE GLUCONATE CLOTH 2 % EX PADS
6.0000 | MEDICATED_PAD | Freq: Every day | CUTANEOUS | Status: AC
  Administered 2015-05-18 – 2015-05-22 (×5): 6 via TOPICAL

## 2015-05-18 MED ORDER — SODIUM CHLORIDE 0.9 % IV BOLUS (SEPSIS)
1000.0000 mL | Freq: Once | INTRAVENOUS | Status: AC
  Administered 2015-05-18: 1000 mL via INTRAVENOUS

## 2015-05-18 MED ORDER — SODIUM CHLORIDE 0.9 % IV BOLUS (SEPSIS)
500.0000 mL | Freq: Once | INTRAVENOUS | Status: AC
  Administered 2015-05-18: 500 mL via INTRAVENOUS

## 2015-05-18 NOTE — Progress Notes (Signed)
PULMONARY / CRITICAL CARE MEDICINE   Name: Juan RipperRobert Pflug MRN: [id number] DOB: July 17, 1975    ADMISSION DATE: 05/16/2015 CONSULTATION DATE: 05/16/2015  REFERRING MD:  Rehab MD  CHIEF COMPLAINT: Respiratory Distress/ Hypoxemia  HISTORY OF PRESENT ILLNESS:   40 y/o M with PMH of sickle cell trait, PNA and autism with recent prolonged hospitalization for multilobar PNA with ARDS, AKI requiring HD (resolved, off HD), prolonged respiratory failure s/p tracheostomy. [facility] further ventilator we[facility]. At [facility], he was He subsequently was decannulated and [facility] on 4/25.Am of 5/5 2017 pt developed progressive dry cough with acute desaturation into the 70's on 4L min [place], after an episode of emesis. ( ? Desaturation) Placed on 100 % mask,  saturations are now 100%, with decreased WOB. Pt. was given 1 dose of Lasix 40 mg IV, no void as of yet.   SUBJECTIVE:  Required 1500 ml IVF overnight for low BP. Inarticulate about thirst He had been tolerating PO diet well, passed a swallow eval again on 5/4 but has been having trouble with pills.  Discussed w nurse   HEMODYNAMICS:  Temp:  [98.1 F (36.7 C)-99.1 F (37.3 C)] 98.9 F (37.2 C) (05/07 0747) Pulse Rate:  [70-119] 88 (05/07 0747) Resp:  [17-51] 17 (05/07 0747) BP: (75-132)/(42-64) 113/64 mmHg (05/07 0747) SpO2:  [88 %-100 %] 93 % (05/07 0856) Weight:  [92 kg (202 lb 13.2 oz)] 92 kg (202 lb 13.2 oz) (05/07 0300)     INTAKE / OUTPUT: I/O last 3 completed shifts: In: 5313.7 [P.O.:480; I.V.:783.7; IV Piggyback:4050] Out: 1375 [Urine:1375]  PHYSICAL EXAMINATION: Physical Exam: General- No distress,  A&Ox2, , autistic male, awake, speaking inarticulate on his phone ENT: no stridor Cardiac: S1, S2, regular rate and rhythm, no murmur Chest: 94% sat on room air and unlabored, with nasal prongs on forehead Abd.: Soft Non-tender, BS + Ext: No clubbing cyanosis, edema or tenderness Neuro:   Non-focal Skin: No rashes, warm and dry, intact Psych: asleep   LABS:  BMET  Recent Labs Lab 05/12/15 1142 05/17/15 0455  NA 142 138  K 4.1 3.9  CL 108 100*  CO2 23 26  BUN 10 16  CREATININE 1.14 1.62*  GLUCOSE 105* 90    Electrolytes  Recent Labs Lab 05/12/15 1142 05/17/15 0455  CALCIUM 9.2 8.7*    CBC  Recent Labs Lab 05/15/15 0442 05/16/15 1832 05/17/15 0455  WBC 10.8* 16.3* 11.1*  HGB 9.1* 8.3* 8.2*  HCT 27.6* 25.4* 25.0*  PLT 242 263 259    Coag's No results for input(s): APTT, INR in the last 168 hours.  Sepsis Markers  Recent Labs Lab 05/16/15 1832 05/17/15 0455 05/18/15 0400  PROCALCITON 1.20 1.67 0.88    ABG No results for input(s): PHART, PCO2ART, PO2ART in the last 168 hours.  Liver Enzymes  Recent Labs Lab 05/17/15 0455  AST 21  ALT 19  ALKPHOS 57  BILITOT 0.9  ALBUMIN 2.5*    Cardiac Enzymes No results for input(s): TROPONINI, PROBNP in the last 168 hours.  Glucose  Recent Labs Lab 05/11/15 1205 05/18/15 0812  GLUCAP 100* 89    Imaging No results found.   STUDIES:  03/10/2015 Echo: EF: 60-65%  CULTURES: Sputum- none obtained Blood x 2   5/5 >> Urine  5/5 >> Urine 4/7- Enterococcus R to Vanc, S to Linezolid  ANTIBIOTICS: Vanc 5/5> Zosyn 5/5>  SIGNIFICANT EVENTS: 5/5 > worsening cough which provoked emesis event leading  to desaturations and increased WOB. CXR 5/4 with  Interstitial edema and bilateral infiltrates. 5/5 CXR shows continued mild interstitial edema,bilateral basilar atelectasis or infiltrate LINES/TUBES: Right PICC 05/04>>  DISCUSSION: 40 year old autistic male who had a prolonged hospitalization requiring tracheostomy who was decannulated then sent to CIR. In CIR the patient developed right sided infiltrate and PCCM was consulted 5/3. Started on vanco / zosyn / levaquin; the levaquin was stopped 5/4. He has bilateral basilar infiltrates vs atx, dry cough. On 5/5 he developed  desaturation to 70's, placed on 1.00 mask. He is awake, continues to cough w no sputum. Procalcitonin initially <0.10, but empiric antibiotics started. Acute on chronic hypoxemia with cough, concerning for HCAP vs aspiration pneumonitis/PNA:   ASSESSMENT / PLAN:  PULMONARY A: Acute on Chronic hypoxemia with cough concerning for HCAP vs. Pneumonitis/ PNA/ Atelectasis Aspiration Risk/ Respiratory compromise Procalcitonin 1.67, WBC 5/5 16.3> 5/6 11.1 on VancZosyn Oxygenation improving P:  Titrate oxygen for sats  >92% Daily CXR Continue Empiric abx coverage  Cultures per ID Continue Vanc and Zosyn- need pharmacy dosing Vanc for rising creat Monitor saturations Gentle diuresis Aspiration Precautions Ambulate Chest PT Speech swallowing eval  CARDIOVASCULAR A:  Mild sinus tach, but low BPs addressed w fluid boluses. No acute issues P:  Tele monitoring  DC lopressor for soft BP  RENAL Creatinine rising on vanc Positive I&O especially after fluid blouses overnight A: need pharma dosing  P:   Trend BMET daily Replete electrolytes as needed Strict I&O Desire negative balance-   GASTROINTESTINAL A:   Emesis event earlier this am Abdomen soft and slightly tender C/O hungry to nurse P:   Advance diet as tolerated. Aspiration precautions remain Antiemetic as needed for nausea  HEMATOLOGIC A:   VTE Prophylaxis Anemia  Sickle Cell Trait P:  SCD's / Heparin CBC in am Trend CBC Transfuse for HGB <7.   INFECTIOUS A:  Pneumonitis or/ and. Pulmonary edema with new bilateral infiltrates. Potentially aspiration pneumonia       WBC and Procalcitonin up  P:   ABX as above ( Van/ Zosyn) Follow cultures as above ( Blood Culture/ Urine Culture) PCT algorithm to limit abx exposure. CBC daily Trend WBC/ Fever curve  ENDOCRINE A:  No Acute Issues   P:   Monitor serum glucose Add SSI if hyperglycemic  NEUROLOGIC A:   No acute issues P:   Monitor for AMS Neuro  check every 4 hours  FAMILY    - Inter-disciplinary family meet or Palliative Care meeting due by:  May 23, 2015  Discussed with nurse 5/7. Speech swallow to reassess. Update labs and CXR, dc lopressor   CD Maple Hudson, MD [facility] Pulmonary/Critical Care Medicine [facility] Pager: [phone]  05/18/2015, 9:06 AM

## 2015-05-18 NOTE — Evaluation (Addendum)
Clinical/Bedside Swallow Evaluation Patient Details  Name: Juan Cameron MRN: [id number] Date of Birth: Jul 22, 1975  Today's Date: 05/18/2015 Time: SLP Start Time (ACUTE ONLY): 1005 SLP Stop Time (ACUTE ONLY): 1020 SLP Time Calculation (min) (ACUTE ONLY): 15 min  Past Medical History:  Past Medical History  Diagnosis Date  . Autism   . Sickle cell trait (HCC)   . Pneumonia 02/2015  . Autism    Past Surgical History:  Past Surgical History  Procedure Laterality Date  . No past surgeries     HPI:  Pt is a 39 y.o. male with PMH of autism and ADHD as well as sickle cell trait, independent prior to admission. Pt was admitted for PNA and discharged 03/10/15 still using 4L oxygen, then admitted 04/07/15 with respiratory failure requiring intubation. Required tracheostomy 03/20/2015 and after failed extubation [facility]/2015. Pt was ultimately decannulated 04/25/2015, diet slowly advanced to a regular consistency, bouts of tachycardia atrial fibrillation converted back to normal sinus rhythm. Pt was in inpatient rehab, SLP evaluated 5/4 and was being followed for dysphagia due to deconditioning and cognitive impairment but was on regular solids/ thin liquids. CXR 5/6 showed suspect bilateral LL PNA. Bedside swallow eval ordered to reassess swallow function.   Assessment / Plan / Recommendation Clinical Impression  Pt with no overt s/s of aspiration at bedside. Swallow appeared timely, no difficulties observed with mastication of solid. Pt able to follow cues provided to take small bites/ sips. Upon entering room, O2s were in upper 80s; RN reported this was due to pt breathing through mouth and not through nasal cannula. By the end of swallow evaluation, O2s were back at 100%. Given current respiratory status and cognitive status, risk of aspiration appears mild-moderate; pt was able to follow cues for small bites/ sips but unsure if pt will do  this independently. Recommend regular diet, thin liquids, meds crushed in puree. Pt would also benefit from intermittent supervision during meals to ensure pt in upright position and cue for small bites/ sips. Will continue to follow for diet tolerance/ education.     Aspiration Risk  Mild aspiration risk;Moderate aspiration risk    Diet Recommendation Regular;Thin liquid   Liquid Administration via: Cup;Straw Medication Administration: Crushed with puree Supervision: Patient able to self feed;Intermittent supervision to cue for compensatory strategies Compensations: Minimize environmental distractions;Slow rate;Small sips/bites Postural Changes: Seated upright at 90 degrees    Other  Recommendations Oral Care Recommendations: Oral care BID   Follow up Recommendations  24 hour supervision/assistance    Frequency and Duration min 1 x/week  1 week       Prognosis        Swallow Study   General HPI: Pt is a 40 y.o. male with PMH of autism and ADHD as well as sickle cell trait, independent prior to admission. Pt was admitted for PNA and discharged 03/10/15 still using 4L oxygen, then admitted 04/07/15 with respiratory failure requiring intubation. Required tracheostomy 03/20/2015 and after failed extubation [facility]/2015. Pt was ultimately decannulated 04/25/2015, diet slowly advanced to a regular consistency, bouts of tachycardia atrial fibrillation converted back to normal sinus rhythm. Pt was in inpatient rehab, SLP evaluated 5/4 and was being followed for dysphagia due to deconditioning and cognitive impairment but was on regular solids/ thin liquids. CXR 5/6 showed suspect bilateral LL PNA. Bedside swallow eval ordered to reassess swallow function. Type of Study: Bedside Swallow  Evaluation [facility] Diet Prior to this Study: Thin liquids (full liquid) Temperature Spikes Noted:  Yes Respiratory Status: Nasal cannula Trach Size and Type:  (decannulated 4/14) History of Recent Intubation: Yes Date extubated: 04/20/15 (trached) Behavior/Cognition: Alert;Cooperative;Pleasant mood Oral Cavity Assessment: Within Functional Limits Oral Care Completed by SLP: No Oral Cavity - Dentition: Adequate natural dentition Vision: Functional for self-feeding Self-Feeding Abilities: Able to feed self Patient Positioning: Upright in bed Baseline Vocal Quality: Normal Volitional Cough: Strong Volitional Swallow: Able to elicit    Oral/Motor/Sensory Function Overall Oral Motor/Sensory Function: Within functional limits   Ice Chips Ice chips: Not tested   Thin Liquid Thin Liquid: Within functional limits Presentation: Cup;Straw    Nectar Thick Nectar Thick Liquid: Not tested   Honey Thick Honey Thick Liquid: Not tested   Puree Puree:  (refused)   Solid   GO   Solid: Within functional limits Presentation: Self Krystal ClarkFed        [name], MA, CCC-SLP 05/18/2015,10:30 AM [phone]

## 2015-05-18 NOTE — Progress Notes (Signed)
CRITICAL VALUE ALERT  Critical value received:  Vancomycin trough 57  Date of notification:  05/18/2015  Time of notification:  0459  Critical value read back:Yes.    Nurse who received alert:  Dianna Rossettiharles Alexandru Moorer, RN  MD notified (1st page):  Pharmacy  Time of first page:  0501  MD notified (2nd page):  Time of second page:  Responding MD:  Pharmacy  Time MD responded:  0501, hold 0600 Vanc.  Herma ArdMOSELEY, Jamiyla Ishee F, RN

## 2015-05-18 NOTE — Progress Notes (Signed)
Pharmacy Antibiotic Note  Juan RipperRobert Cameron is a 40 y.o. male admitted on 05/16/2015 with pneumonia.  Pharmacy has been consulted for Zosyn and vancomycin dosing.  Day #5 of abx for HAP. Scr increased to 1.62, est CrCl 69 ml/min. WBC trending down to 11.1. UOP not charted accurately.  Vancomycin trough 57 mcg/ml (SUPRAtherapeutic for goal of 15-20 mcg/ml) on 1gm IV q8h. Drawn appropriately.  Plan: Continue Zosyn 3.375 gm IV q8h (4 hour infusion) Hold vancomycin Will f/u 24 hr vancomycin random level and dose based on kinetics  Height: 5\' 9"  (175.3 cm) Weight: 202 lb 13.2 oz (92 kg) IBW/kg (Calculated) : 70.7  Temp (24hrs), Avg:98.6 F (37 C), Min:98.1 F (36.7 C), Max:99.1 F (37.3 C)   Recent Labs Lab 05/12/15 1142 05/14/15 0544 05/15/15 0442 05/16/15 1832 05/17/15 0455 05/18/15 0400  WBC 12.4* 12.2* 10.8* 16.3* 11.1*  --   CREATININE 1.14  --   --   --  1.62*  --   VANCOTROUGH  --   --   --   --   --  57*    Estimated Creatinine Clearance: 69.3 mL/min (by C-G formula based on Cr of 1.62).    Allergies  Allergen Reactions  . Peanuts [Peanut Oil] Anaphylaxis    Antimicrobials this admission: 5/2 Levaquin >> 5/3 5/3 Zosyn >> 5/3 Vancomycin >>  Dose adjustments this admission: n/a  Microbiology results: 5/2 Urine cx > multiple species  Thank you for allowing pharmacy to be a part of this patient's care.  Christoper Fabianaron Neha Waight, PharmD, BCPS Clinical pharmacist, pager [phone] 05/18/2015 5:19 AM

## 2015-05-18 NOTE — Progress Notes (Signed)
Spoke with Dr Arsenio LoaderSommer about pt's increase in heart rate from low 100's to sustaining 120's sinus tach. Also asked for something to give pt for headache and cough. Received order for 1000ml NS bolus for heart rate and advil one time dose for headache and cough syrup for cough.  Relayed information and new orders to PM nurse Leonette Mostharles. Marisue Ivanobyn Giovanni Bath RN

## 2015-05-18 NOTE — Progress Notes (Signed)
eLink Physician-Brief Progress Note Patient Name: Juan RipperRobert Cameron DOB: 25-Jan-1975 MRN: [id number]   Date of Service  05/18/2015  HPI/Events of Note  Low bp follwing bp meds. Aysmptomatic  eICU Interventions  Fluid bolus     Intervention Category Major Interventions: Hypotension - evaluation and management  [name] 05/18/2015, 1:00 AM

## 2015-05-18 NOTE — Progress Notes (Signed)
MD Young notified of patient's complaint of a headache, new orders received. Pt diet also advanced to regular per speech recommendations. Will continue to monitor.

## 2015-05-19 ENCOUNTER — Inpatient Hospital Stay (HOSPITAL_COMMUNITY): Payer: Medicare Other

## 2015-05-19 DIAGNOSIS — S27309A Unspecified injury of lung, unspecified, initial encounter: Secondary | ICD-10-CM

## 2015-05-19 DIAGNOSIS — A419 Sepsis, unspecified organism: Secondary | ICD-10-CM | POA: Diagnosis not present

## 2015-05-19 DIAGNOSIS — J9600 Acute respiratory failure, unspecified whether with hypoxia or hypercapnia: Secondary | ICD-10-CM

## 2015-05-19 LAB — BLOOD GAS, ARTERIAL
ACID-BASE DEFICIT: 1.1 mmol/L (ref 0.0–2.0)
Bicarbonate: 22.5 mEq/L (ref 20.0–24.0)
Drawn by: 398981
O2 Content: 15 L/min
O2 SAT: 81.9 %
PATIENT TEMPERATURE: 98.6
TCO2: 23.6 mmol/L (ref 0–100)
pCO2 arterial: 33.9 mmHg — ABNORMAL LOW (ref 35.0–45.0)
pH, Arterial: 7.438 (ref 7.350–7.450)
pO2, Arterial: 45.4 mmHg — ABNORMAL LOW (ref 80.0–100.0)

## 2015-05-19 LAB — BASIC METABOLIC PANEL
Anion gap: 11 (ref 5–15)
BUN: 11 mg/dL (ref 6–20)
CO2: 22 mmol/L (ref 22–32)
Calcium: 8.4 mg/dL — ABNORMAL LOW (ref 8.9–10.3)
Chloride: 109 mmol/L (ref 101–111)
Creatinine, Ser: 1.6 mg/dL — ABNORMAL HIGH (ref 0.61–1.24)
GFR calc non Af Amer: 53 mL/min — ABNORMAL LOW (ref >60)
GLUCOSE: 92 mg/dL (ref 65–99)
POTASSIUM: 3.7 mmol/L (ref 3.5–5.1)
Sodium: 142 mmol/L (ref 135–145)

## 2015-05-19 LAB — CBC WITH DIFFERENTIAL/PLATELET
Basophils Absolute: 0 10*3/uL (ref 0.0–0.1)
Basophils Relative: 0 %
Eosinophils Absolute: 0.8 10*3/uL — ABNORMAL HIGH (ref 0.0–0.7)
Eosinophils Relative: 7 %
HCT: 23 % — ABNORMAL LOW (ref 39.0–52.0)
Hemoglobin: 7.4 g/dL — ABNORMAL LOW (ref 13.0–17.0)
LYMPHS ABS: 1.8 10*3/uL (ref 0.7–4.0)
LYMPHS PCT: 17 %
MCH: 26.3 pg (ref 26.0–34.0)
MCHC: 32.2 g/dL (ref 30.0–36.0)
MCV: 81.9 fL (ref 78.0–100.0)
MONO ABS: 1 10*3/uL (ref 0.1–1.0)
Monocytes Relative: 10 %
Neutro Abs: 7 10*3/uL (ref 1.7–7.7)
Neutrophils Relative %: 66 %
PLATELETS: 273 10*3/uL (ref 150–400)
RBC: 2.81 MIL/uL — AB (ref 4.22–5.81)
RDW: 15.2 % (ref 11.5–15.5)
WBC: 10.6 10*3/uL — ABNORMAL HIGH (ref 4.0–10.5)

## 2015-05-19 LAB — VANCOMYCIN, RANDOM: Vancomycin Rm: 24 ug/mL

## 2015-05-19 LAB — GLUCOSE, CAPILLARY: Glucose-Capillary: 87 mg/dL (ref 65–99)

## 2015-05-19 MED ORDER — VANCOMYCIN HCL IN DEXTROSE 1-5 GM/200ML-% IV SOLN
1000.0000 mg | Freq: Two times a day (BID) | INTRAVENOUS | Status: DC
  Filled 2015-05-19: qty 200

## 2015-05-19 MED ORDER — LORAZEPAM 2 MG/ML IJ SOLN
0.5000 mg | Freq: Once | INTRAMUSCULAR | Status: AC
  Administered 2015-05-19: 0.5 mg via INTRAVENOUS

## 2015-05-19 MED ORDER — METOPROLOL TARTRATE 5 MG/5ML IV SOLN
2.5000 mg | INTRAVENOUS | Status: DC | PRN
  Administered 2015-05-19 – 2015-05-20 (×2): 2.5 mg via INTRAVENOUS
  Administered 2015-05-22: 5 mg via INTRAVENOUS
  Filled 2015-05-19 (×3): qty 5

## 2015-05-19 MED ORDER — LORAZEPAM 2 MG/ML IJ SOLN
INTRAMUSCULAR | Status: AC
  Administered 2015-05-19: 0.5 mg via INTRAVENOUS
  Filled 2015-05-19: qty 1

## 2015-05-19 MED ORDER — DEXTROSE 5 % IV SOLN
1.0000 g | INTRAVENOUS | Status: DC
  Administered 2015-05-19 – 2015-05-20 (×2): 1 g via INTRAVENOUS
  Filled 2015-05-19 (×2): qty 10

## 2015-05-19 NOTE — Progress Notes (Signed)
eLink Physician-Brief Progress Note Patient Name: Juan RipperRobert Morrisette DOB: 1975-12-31 MRN: [id number]   Date of Service  05/19/2015  HPI/Events of Note  RN calls for tachypnea. Pt with autism admitted for HCAP.  On 35-40% VM now > sats in mid 90s. HR 110, RR 30s.  Pt does not want bipap and sister said he does not tolerate bipap.  Component of anxiety > pt just received clonazepam.    eICU Interventions  Plan for CXR > possible pulm edema based on notes  and CXR. May need diuresis.  Needs cbc, bmet. Creat was elevated days ago.   Observe pt for now.   Avoiding oversedation     Intervention Category Intermediate Interventions: Respiratory distress - evaluation and management  Daneen SchickJose [name] 05/19/2015, 1:47 AM

## 2015-05-19 NOTE — Progress Notes (Signed)
PULMONARY / CRITICAL CARE MEDICINE   Name: Juan RipperRobert Cameron MRN: [id number] DOB: 10/11/1975    ADMISSION DATE: 05/16/2015 CONSULTATION DATE: 05/16/2015  REFERRING MD:  Rehab MD  CHIEF COMPLAINT: Respiratory Distress/ Hypoxemia  HISTORY OF PRESENT ILLNESS:   40 y/o M with PMH of sickle cell trait, PNA and autism with recent prolonged hospitalization for multilobar PNA with ARDS, AKI requiring HD (resolved, off HD), prolonged respiratory failure s/p tracheostomy. [facility], [facility] was He subsequently was decannulated and [facility].Am of 5/5 2017 pt developed progressive dry cough with acute desaturation into the 70's on 4L min [place], after an episode of emesis. ( ? Desaturation) Placed on 100 % mask,  saturations are now 100%, with decreased WOB. Pt. was given 1 dose of Lasix 40 mg IV, no void as of yet.   SUBJECTIVE:  pcxr improved No vomiting further O2 needs remain up, but no distress  HEMODYNAMICS:  Temp:  [98 F (36.7 C)-101 F (38.3 C)] 99.2 F (37.3 C) (05/08 0800) Pulse Rate:  [116-131] 131 (05/08 0759) Resp:  [26-46] 36 (05/08 0759) BP: (112-129)/(55-77) 120/77 mmHg (05/08 0759) SpO2:  [85 %-100 %] 94 % (05/08 0806) FiO2 (%):  [22 %-100 %] 100 % (05/08 0806) Weight:  [204 lb 2.3 oz (92.6 kg)] 204 lb 2.3 oz (92.6 kg) (05/08 0500)   Vent Mode:  [-]  FiO2 (%):  [22 %-100 %] 100 % INTAKE / OUTPUT: I/O last 3 completed shifts: In: 4090 [P.O.:420; I.V.:720; IV Piggyback:2950] Out: 2125 [Urine:2125]  PHYSICAL EXAMINATION: Physical Exam: General- No distress,  A&Ox2, , autistic male, awake ENT: no stridor Cardiac: S1, S2, regular rate and rhythm, no murmur Chest: non-labored on 15L high flow, lungs bilaterally with basilar crackles Abd.: Soft Non-tender, BS + Ext: No clubbing cyanosis, edema or tenderness Neuro:  Non-focal Skin: No rashes, warm and dry, intact Psych:  calm   LABS:  BMET  Recent Labs Lab 05/12/15 1142 05/17/15 0455 05/19/15 0552  NA 142 138 142  K 4.1 3.9 3.7  CL 108 100* 109  CO2 23 26 22   BUN 10 16 11   CREATININE 1.14 1.62* 1.60*  GLUCOSE 105* 90 92    Electrolytes  Recent Labs Lab 05/12/15 1142 05/17/15 0455 05/19/15 0552  CALCIUM 9.2 8.7* 8.4*    CBC  Recent Labs Lab 05/16/15 1832 05/17/15 0455 05/19/15 0552  WBC 16.3* 11.1* 10.6*  HGB 8.3* 8.2* 7.4*  HCT 25.4* 25.0* 23.0*  PLT 263 259 273    Coag's No results for input(s): APTT, INR in the last 168 hours.  Sepsis Markers  Recent Labs Lab 05/16/15 1832 05/17/15 0455 05/18/15 0400  PROCALCITON 1.20 1.67 0.88    ABG No results for input(s): PHART, PCO2ART, PO2ART in the last 168 hours.  Liver Enzymes  Recent Labs Lab 05/17/15 0455  AST 21  ALT 19  ALKPHOS 57  BILITOT 0.9  ALBUMIN 2.5*    Cardiac Enzymes No results for input(s): TROPONINI, PROBNP in the last 168 hours.  Glucose  Recent Labs Lab 05/18/15 0812 05/19/15 0800  GLUCAP 89 87    Imaging Dg Chest Port 1 View  05/19/2015  CLINICAL DATA:  Dyspnea EXAM: PORTABLE CHEST 1 VIEW COMPARISON:  05/17/2015 FINDINGS: Satisfactorily positioned right upper extremity PICC line. Multifocal airspace opacities persist bilaterally without significant interval change. No pneumothorax. IMPRESSION: No significant interval change in the bilateral airspace opacities. Electronically Signed  By: Ellery Plunk M.D.   On: 05/19/2015 02:44     STUDIES:  03/10/2015 Echo: EF: 60-65%  CULTURES: Sputum- none obtained Blood x 2  5/5 >> Urine  5/5 >> Urine 4/7- Enterococcus R to Vanc, S to Linezolid  ANTIBIOTICS: Vanc 5/5>5/8 Zosyn 5/5>  SIGNIFICANT EVENTS: 5/5 > worsening cough which provoked emesis event leading to desaturations and increased WOB. CXR 5/4 with  Interstitial edema and bilateral infiltrates. 5/5 CXR shows continued mild interstitial edema,bilateral basilar  atelectasis or infiltrate  LINES/TUBES: Right PICC 05/04>>  DISCUSSION: 40 year old autistic male who had a prolonged hospitalization requiring tracheostomy who was decannulated then sent to CIR. In CIR the patient developed right sided infiltrate and PCCM was consulted 5/3. Started on vanco / zosyn / levaquin; the levaquin was stopped 5/4. He has bilateral basilar infiltrates vs atx, dry cough. On 5/5 he developed desaturation to 70's, placed on 1.00 mask. He is awake, continues to cough w no sputum. Procalcitonin initially <0.10, but empiric antibiotics started. Acute on chronic hypoxemia with cough, concerning for HCAP vs aspiration pneumonitis/PNA:    ASSESSMENT / PLAN:  PULMONARY A: Acute on Chronic hypoxemia with cough concerning for HCAP vs. Pneumonitis/ PNA/ Atelectasis Aspiration Risk/ Respiratory compromise Procalcitonin 1.67, WBC 5/5 16.3> 5/6 11.1 on VancZosyn Oxygenation improving P:  Titrate oxygen for sats  >92% Daily CXR Continue Empiric abx coverage  Cultures as above Narrow abx to ceftriaxone 5/8 Monitor saturations Gentle diuresis Aspiration Precautions Ambulate / mobilize Chest PT Speech swallowing eval  CARDIOVASCULAR A:  Sinus Tachycardia - with low BPs, addressed w fluid boluses. No acute issues P:  Tele monitoring DC lopressor with BP Monitor hemodynamics in SDU  RENAL A: Elevated CR - creatinine rising on vanco, positive fluid balance P:   Trend BMET daily Replete electrolytes as needed Strict I&O Goal negative balance as BP / Sr Cr tolerate   GASTROINTESTINAL A:   Emesis - 5/5, resolved P:   Advance diet as tolerated.  SLP evaluation for swallowing needs Aspiration precautions  Antiemetic as needed for nausea  HEMATOLOGIC A:   VTE Prophylaxis Anemia  Sickle Cell Trait P:  SCD's / Heparin CBC in am Trend CBC Transfuse for HGB <7  INFECTIOUS A:   Pneumonitis or/ and. Pulmonary edema with new bilateral  infiltrates. Potentially aspiration pneumonia.  WBC and Procalcitonin up P:   ABX as above  Follow cultures as above  PCT algorithm to limit abx exposure >>negative  CBC daily Trend WBC/ Fever curve  ENDOCRINE A:   No Acute Issues P:   Monitor serum glucose Add SSI if hyperglycemic  NEUROLOGIC A:   No acute issues P:   Monitor for AMS Neuro check every 4 hours   FAMILY:  No family available am 5/8.     - Inter-disciplinary family meet or Palliative Care meeting due by:  5/12  Canary Brim, NP-C [facility] Pgr: [phone] or if no answer [phone] 05/19/2015, 10:32 AM    STAFF NOTE: I, Rory Percy, MD FACP have personally reviewed patient's available data, including medical history, events of note, physical examination and test results as part of my evaluation. I have discussed with resident/NP and other care providers such as pharmacist, RN and RRT. In addition, I personally evaluated patient and elicited key findings of: awake, no distress, a little higher O2 needs with 15 L high flow, SLP recs noted, no more vomiting, continued lasix to neg balance goals, culture neg, narrow abx to ceftriaxone.  Push mobilization / PT efforts. Would follow pcxr and crt further  To triad, call if needed   Mcarthur Rossetti. Tyson Alias, MD, FACP Pgr: [phone] [place] [facility] 05/19/2015 10:57 AM

## 2015-05-19 NOTE — Progress Notes (Signed)
PCCM Interval Progress Note  [facility] to assess pt for sinus tach (has been this way most of the day per RN), desaturations to 85% on 2L O2 and tachypnea (RR in 40's).  BP 145/74 mmHg  Pulse 142  Temp(Src) 98.9 F (37.2 C) (Oral)  Resp 42  Ht 5\' 9"  (1.753 m)  Wt 204 lb 2.3 oz (92.6 kg)  BMI 30.13 kg/m2  SpO2 94%  Gen: Adult male, anxious but in NAD. Heart:  Tachy, regular.  No M/R/G. Lungs:  Resps rapid, clear bilaterally. Ext: No edema.  Acute on chronic hypoxemic respiratory failure - exacerbated by anxiety currently. Concern for HCAP vs pneumonitis. Plan: Continue supplemental O2 as needed to maintain SpO2 > 92%. Continue empiric abx, BD's. 0.5mg  lorazepam IV x 1 (will give IV lorazepam tonight instead of his scheduled PO clonazepam). Monitor closely, if no change with above or deteriorates at all then will move to ICU for probable intubation (will more than likely not tolerate NIMV).  Sinus tach - had issues with this before along with hypotension.  Resolved after fluid boluses.  BP normal tonight (SBP 145). Plan: Lopressor PRN.   RN asked to continue to monitor closely and [facility] with any concerns or if pt deteriorates.  Additional CC time: 20 minutes.   Rutherford Guysahul Tanush Drees, [place]PA - C [place] [facility] Pager: [phone]  or [phone] 05/19/2015, 9:18 PM

## 2015-05-19 NOTE — Progress Notes (Signed)
Pharmacy Antibiotic Note  Juan Cameron is a 40 y.o. male admitted on 05/16/2015 with pneumonia. He continues on day #6 antibiotics. A vancomycin trough drawn yesterday was supratherapeutic (57) on 1g q8 - doses were held. Another level drawn ~ 26 hours later is 24 which is still above goal 15-20. Vancomycin half-life is now ~ 16 hours.   Plan: 1) Resume vancomycin 1g IV q12 starting this evening  Height: 5\' 9"  (175.3 cm) Weight: 204 lb 2.3 oz (92.6 kg) IBW/kg (Calculated) : 70.7  Temp (24hrs), Avg:99.1 F (37.3 C), Min:98 F (36.7 C), Max:101 F (38.3 C)   Recent Labs Lab 05/12/15 1142 05/14/15 0544 05/15/15 0442 05/16/15 1832 05/17/15 0455 05/18/15 0400 05/19/15 0552 05/19/15 0553  WBC 12.4* 12.2* 10.8* 16.3* 11.1*  --  10.6*  --   CREATININE 1.14  --   --   --  1.62*  --  1.60*  --   VANCOTROUGH  --   --   --   --   --  457*  --   --   VANCORANDOM  --   --   --   --   --   --   --  24    Estimated Creatinine Clearance: 70.4 mL/min (by C-G formula based on Cr of 1.6).    Allergies  Allergen Reactions  . Peanuts [Peanut Oil] Anaphylaxis    Antimicrobials this admission: 5/2 Levaquin >> 5/3 5/3 Zosyn >> 5/3 Vancomycin >>  Dose adjustments this admission: 5/7 VT: 57 on 1 g IV q8h - doses held 5/8 VR: 24  Microbiology results: 5/2 Urine cx > multiple species 5/5 Urine cx > negative final  Thank you for allowing pharmacy to be a part of this patient's care.  Fredrik RiggerMarkle, Tiffani Kadow Sue 05/19/2015 9:37 AM

## 2015-05-19 NOTE — Progress Notes (Signed)
eLink Physician-Brief Progress Note Patient Name: Juan RipperRobert Cameron DOB: 08-Mar-1975 MRN: [id number]   Date of Service  05/19/2015  HPI/Events of Note  F/u on pt. Earlier was tachypneic and in mild distress.   Pt looks more comfortable. HR 100, RR 28-34, 99% on 40% VM  CXR on 5/8 > no sig change compared to 5/6 CXR  eICU Interventions  Cont present management. Cont abx. Sister updated. If with worse resp status, plan to intubate pt as he does not tolerate bipap.         [name] [address][name] 05/19/2015, 3:03 AM

## 2015-05-19 NOTE — Progress Notes (Addendum)
Pt from CIR where he was admitted via [facility] on 4/25 until 5/5 when he was readmitted to acute due to medical issues. Pt was close to d/c within the next week (5/10). Please restart PT and OT when appropriate.  I will follow his progress. [phone]

## 2015-05-19 NOTE — Progress Notes (Addendum)
eLink Physician-Brief Progress Note Patient Name: Alan RipperRobert Zirbel DOB: 11/09/1975 MRN: [id number]   Date of Service  05/19/2015  HPI/Events of Note  Respiratory Distress - Sat = 84% on [place] O2. RR = 49. HR = 150 (Sinus Tachycardia). Hx of Autism and not very cooperative. May not tolerate BiPAP.  eICU Interventions  Will order: 1. Place on 100% NRBM. 2. Portable CXR STAT. 3. ABG STAT. 4. Will ask floor coverage to assess the patient at bedside.      Intervention Category Intermediate Interventions: Arrhythmia - evaluation and management;Respiratory distress - evaluation and management  Lenell AntuSommer,Steven Eugene 05/19/2015, 8:41 PM

## 2015-05-19 NOTE — Progress Notes (Signed)
Speech Language Pathology Treatment: Dysphagia  Patient Details Name: Juan Cameron MRN: [id number] DOB: Jul 13, 1975 Today's Date: 05/19/2015 Time: [id number] SLP Time Calculation (min) (ACUTE ONLY): 8 min  Assessment / Plan / Recommendation Clinical Impression  SLP provided assistance with repositioning for safer intake prior to administering thin lqiuid trials, which pt self-fed via straw. No overt s/s of aspiration observed, and despite elevated RR (high 20s to low 30s), he did appear to have consistent exhalation post-swallow. Pt politely declined solid POs, stating that the inside of his right cheek was hurting. SLP tried to view, but unable to visualize any sore or lesion prior to pt closing his mouth. Recommend to continue with current diet. SLP will continue to follow given limited observation today.   HPI HPI: Pt is a 40 y.o. male with PMH of autism and ADHD as well as sickle cell trait, independent prior to admission. Pt was admitted for PNA and discharged 03/10/15 still using 4L oxygen, then admitted 04/07/15 with respiratory failure requiring intubation. Required tracheostomy 03/20/2015 and after failed extubation [facility]/2015. Pt was ultimately decannulated 04/25/2015, diet slowly advanced to a regular consistency, bouts of tachycardia atrial fibrillation converted back to normal sinus rhythm. Pt was in inpatient rehab, SLP evaluated 5/4 and was being followed for dysphagia due to deconditioning and cognitive impairment but was on regular solids/ thin liquids. CXR 5/6 showed suspect bilateral LL PNA. Bedside swallow eval ordered to reassess swallow function.      SLP Plan  Continue with current plan of care     Recommendations  Diet recommendations: Regular;Thin liquid Liquids provided via: Cup;Straw Medication Administration: Crushed with puree Supervision: Patient able to self feed;Full supervision/cueing for compensatory  strategies Compensations: Minimize environmental distractions;Slow rate;Small sips/bites Postural Changes and/or Swallow Maneuvers: Seated upright 90 degrees;Upright 30-60 min after meal             Oral Care Recommendations: Oral care BID Follow up Recommendations: 24 hour supervision/assistance Plan: Continue with current plan of care     GO               Maxcine HamLaura Paiewonsky, M.A. CCC-SLP [phone]  Maxcine Hamaiewonsky, Elleanor Guyett 05/19/2015, 3:45 PM

## 2015-05-20 ENCOUNTER — Inpatient Hospital Stay (HOSPITAL_COMMUNITY): Payer: Medicare Other

## 2015-05-20 DIAGNOSIS — I5032 Chronic diastolic (congestive) heart failure: Secondary | ICD-10-CM | POA: Diagnosis present

## 2015-05-20 DIAGNOSIS — N179 Acute kidney failure, unspecified: Secondary | ICD-10-CM | POA: Diagnosis present

## 2015-05-20 DIAGNOSIS — R000 Tachycardia, unspecified: Secondary | ICD-10-CM

## 2015-05-20 DIAGNOSIS — J9621 Acute and chronic respiratory failure with hypoxia: Secondary | ICD-10-CM | POA: Diagnosis present

## 2015-05-20 DIAGNOSIS — D573 Sickle-cell trait: Secondary | ICD-10-CM

## 2015-05-20 DIAGNOSIS — J80 Acute respiratory distress syndrome: Secondary | ICD-10-CM

## 2015-05-20 LAB — BASIC METABOLIC PANEL
ANION GAP: 13 (ref 5–15)
BUN: 12 mg/dL (ref 6–20)
CO2: 23 mmol/L (ref 22–32)
Calcium: 8.5 mg/dL — ABNORMAL LOW (ref 8.9–10.3)
Chloride: 103 mmol/L (ref 101–111)
Creatinine, Ser: 1.55 mg/dL — ABNORMAL HIGH (ref 0.61–1.24)
GFR calc Af Amer: >60 mL/min (ref >60)
GFR calc non Af Amer: 55 mL/min — ABNORMAL LOW (ref >60)
GLUCOSE: 109 mg/dL — AB (ref 65–99)
POTASSIUM: 3.7 mmol/L (ref 3.5–5.1)
Sodium: 139 mmol/L (ref 135–145)

## 2015-05-20 LAB — CBC
HEMATOCRIT: 24.7 % — AB (ref 39.0–52.0)
HEMOGLOBIN: 8.1 g/dL — AB (ref 13.0–17.0)
MCH: 27.3 pg (ref 26.0–34.0)
MCHC: 32.8 g/dL (ref 30.0–36.0)
MCV: 83.2 fL (ref 78.0–100.0)
Platelets: 277 10*3/uL (ref 150–400)
RBC: 2.97 MIL/uL — AB (ref 4.22–5.81)
RDW: 15.3 % (ref 11.5–15.5)
WBC: 16.2 10*3/uL — AB (ref 4.0–10.5)

## 2015-05-20 LAB — GLUCOSE, CAPILLARY
GLUCOSE-CAPILLARY: 100 mg/dL — AB (ref 65–99)
Glucose-Capillary: 98 mg/dL (ref 65–99)
Glucose-Capillary: 100 mg/dL — ABNORMAL HIGH (ref 65–99)
Glucose-Capillary: 101 mg/dL — ABNORMAL HIGH (ref 65–99)

## 2015-05-20 LAB — LACTIC ACID, PLASMA: LACTIC ACID, VENOUS: 1.1 mmol/L (ref 0.5–2.0)

## 2015-05-20 MED ORDER — FENTANYL CITRATE (PF) 100 MCG/2ML IJ SOLN
50.0000 ug | Freq: Once | INTRAMUSCULAR | Status: AC
  Administered 2015-05-24: 50 ug via INTRAVENOUS
  Filled 2015-05-20: qty 2

## 2015-05-20 MED ORDER — MIDAZOLAM BOLUS VIA INFUSION
1.0000 mg | INTRAVENOUS | Status: DC | PRN
  Filled 2015-05-20: qty 2

## 2015-05-20 MED ORDER — FENTANYL CITRATE (PF) 100 MCG/2ML IJ SOLN
INTRAMUSCULAR | Status: AC
  Administered 2015-05-20: 100 ug
  Filled 2015-05-20: qty 2

## 2015-05-20 MED ORDER — ROCURONIUM BROMIDE 50 MG/5ML IV SOLN
1.0000 mg/kg | Freq: Once | INTRAVENOUS | Status: AC
  Administered 2015-05-20: 50 mg via INTRAVENOUS

## 2015-05-20 MED ORDER — FENTANYL BOLUS VIA INFUSION
50.0000 ug | INTRAVENOUS | Status: DC | PRN
  Administered 2015-05-23 – 2015-05-28 (×10): 50 ug via INTRAVENOUS
  Filled 2015-05-20: qty 50

## 2015-05-20 MED ORDER — MIDAZOLAM HCL 2 MG/2ML IJ SOLN
1.0000 mg | INTRAMUSCULAR | Status: DC | PRN
  Administered 2015-05-20: 2 mg via INTRAVENOUS
  Filled 2015-05-20: qty 2

## 2015-05-20 MED ORDER — SODIUM CHLORIDE 0.9 % IV SOLN
1500.0000 mg | INTRAVENOUS | Status: DC
  Administered 2015-05-20 – 2015-05-22 (×3): 1500 mg via INTRAVENOUS
  Filled 2015-05-20 (×4): qty 1500

## 2015-05-20 MED ORDER — MIDAZOLAM HCL 5 MG/ML IJ SOLN
0.0000 mg/h | INTRAMUSCULAR | Status: DC
  Administered 2015-05-20: 2 mg/h via INTRAVENOUS
  Administered 2015-05-20: 4 mg/h via INTRAVENOUS
  Administered 2015-05-21: 2 mg/h via INTRAVENOUS
  Filled 2015-05-20 (×2): qty 10

## 2015-05-20 MED ORDER — PRO-STAT SUGAR FREE PO LIQD
30.0000 mL | Freq: Two times a day (BID) | ORAL | Status: AC
  Filled 2015-05-20 (×2): qty 30

## 2015-05-20 MED ORDER — DEXTROSE 5 % IV SOLN
1.0000 g | Freq: Three times a day (TID) | INTRAVENOUS | Status: DC
  Administered 2015-05-20 – 2015-05-27 (×20): 1 g via INTRAVENOUS
  Filled 2015-05-20 (×23): qty 1

## 2015-05-20 MED ORDER — SODIUM CHLORIDE 0.9 % IV SOLN
25.0000 ug/h | INTRAVENOUS | Status: DC
  Filled 2015-05-20: qty 50

## 2015-05-20 MED ORDER — ETOMIDATE 2 MG/ML IV SOLN
0.3000 mg/kg | Freq: Once | INTRAVENOUS | Status: AC
  Administered 2015-05-20: 20 mg via INTRAVENOUS

## 2015-05-20 MED ORDER — FUROSEMIDE 10 MG/ML IJ SOLN
60.0000 mg | Freq: Once | INTRAMUSCULAR | Status: AC
  Administered 2015-05-20: 60 mg via INTRAVENOUS
  Filled 2015-05-20: qty 6

## 2015-05-20 MED ORDER — METOPROLOL TARTRATE 5 MG/5ML IV SOLN
INTRAVENOUS | Status: AC
  Administered 2015-05-20: 5 mg
  Filled 2015-05-20: qty 5

## 2015-05-20 MED ORDER — FENTANYL CITRATE (PF) 100 MCG/2ML IJ SOLN
INTRAMUSCULAR | Status: AC
  Filled 2015-05-20: qty 4

## 2015-05-20 MED ORDER — METOPROLOL TARTRATE 5 MG/5ML IV SOLN: 2.5000 mg | INTRAVENOUS | Status: DC | PRN

## 2015-05-20 MED ORDER — METOPROLOL TARTRATE 25 MG/10 ML ORAL SUSPENSION
12.5000 mg | Freq: Two times a day (BID) | ORAL | Status: DC
  Filled 2015-05-20 (×3): qty 5

## 2015-05-20 MED ORDER — SODIUM CHLORIDE 0.9 % IV SOLN
25.0000 ug/h | INTRAVENOUS | Status: DC
  Administered 2015-05-20: 200 ug/h via INTRAVENOUS
  Administered 2015-05-21: 100 ug/h via INTRAVENOUS
  Administered 2015-05-21: 300 ug/h via INTRAVENOUS
  Administered 2015-05-22: 200 ug/h via INTRAVENOUS
  Administered 2015-05-23: 175 ug/h via INTRAVENOUS
  Administered 2015-05-23: 200 ug/h via INTRAVENOUS
  Administered 2015-05-24: 100 ug/h via INTRAVENOUS
  Administered 2015-05-25: 250 ug/h via INTRAVENOUS
  Administered 2015-05-25: 150 ug/h via INTRAVENOUS
  Administered 2015-05-26 – 2015-05-27 (×3): 250 ug/h via INTRAVENOUS
  Administered 2015-05-28 – 2015-05-29 (×5): 400 ug/h via INTRAVENOUS
  Filled 2015-05-20 (×21): qty 50

## 2015-05-20 MED ORDER — VITAL AF 1.2 CAL PO LIQD: 1000.0000 mL | ORAL | Status: DC

## 2015-05-20 MED ORDER — MIDAZOLAM HCL 2 MG/2ML IJ SOLN
INTRAMUSCULAR | Status: AC
  Filled 2015-05-20: qty 4

## 2015-05-20 MED ORDER — MIDAZOLAM HCL 2 MG/2ML IJ SOLN
INTRAMUSCULAR | Status: AC
  Administered 2015-05-20: 2 mg
  Filled 2015-05-20: qty 2

## 2015-05-20 NOTE — Progress Notes (Signed)
eLink Physician-Brief Progress Note Patient Name: Alan RipperRobert Daris DOB: May 08, 1975 MRN: [id number]   Date of Service  05/20/2015  HPI/Events of Note  Fever of 101  eICU Interventions  Blood culture, cbc, bmet. Pt was started on rocephin on 5/8     Intervention Category Intermediate Interventions: Other:  Daneen SchickJose [name] 05/20/2015, 6:16 AM

## 2015-05-20 NOTE — Progress Notes (Signed)
PULMONARY / CRITICAL CARE MEDICINE   Name: Juan Cameron MRN: [id number] DOB: 01/24/1975    ADMISSION DATE: 05/16/2015 CONSULTATION DATE: 05/16/2015  REFERRING MD:  Rehab MD  CHIEF COMPLAINT: Respiratory Distress/ Hypoxemia  HISTORY OF PRESENT ILLNESS:   40 y/o M with PMH of sickle cell trait, PNA and autism with recent prolonged hospitalization for multilobar PNA with ARDS, AKI requiring HD (resolved, off HD), prolonged respiratory failure s/p tracheostomy. He was discharged to [facility] on 3/27 for further ventilator weaning. At [facility], he was He subsequently was decannulated and transferred to [facility] on 4/25.Am of 5/5 2017 pt developed progressive dry cough with acute desaturation into the 70's on 4L min [place], after an episode of emesis. ( ? Desaturation) Placed on 100 % mask,  saturations are now 100%, with decreased WOB. Pt. was given 1 dose of Lasix 40 mg IV, no void as of yet.  SUBJECTIVE:  Worsening O2 demands overnight now up to 100% NRB with HFNC underneath. He is still SOB despite this and tachypneic with rate 40's.   HEMODYNAMICS:  Temp:  [98.6 F (37 C)-103.2 F (39.6 C)] 103.2 F (39.6 C) (05/09 1101) Pulse Rate:  [109-142] 141 (05/09 1135) Resp:  [25-42] 39 (05/09 1135) BP: (106-145)/(56-74) 125/68 mmHg (05/09 1101) SpO2:  [76 %-100 %] 97 % (05/09 1135) FiO2 (%):  [100 %] 100 % (05/09 0940) Weight:  [90.1 kg (198 lb 10.2 oz)] 90.1 kg (198 lb 10.2 oz) (05/09 0303)   Vent Mode:  [-]  FiO2 (%):  [100 %] 100 % INTAKE / OUTPUT: I/O last 3 completed shifts: In: 1742.7 [P.O.:960; I.V.:632.7; IV Piggyback:150] Out: 2325 [Urine:2325]  PHYSICAL EXAMINATION: General No distress,  A&Ox2, developmentally delayed, awake ENT: [place]/AT, PERRL, no JVD Cardiac: S1, S2, regular rate and rhythm, no murmur Chest: Breathing labored, diffuse crackles. Abd.: Soft, Non-tender, BS + Ext: No clubbing cyanosis, edema or tenderness Neuro:  Non-focal Skin: No rashes, warm and dry,  intact Psych: calm  LABS:  BMET  Recent Labs Lab 05/17/15 0455 05/19/15 0552 05/20/15 0640  NA 138 142 139  K 3.9 3.7 3.7  CL 100* 109 103  CO2 BUN CREATININE 1.62* 1.60* 1.55*  GLUCOSE 90 92 109*   Electrolytes  Recent Labs Lab 05/17/15 0455 05/19/15 0552 05/20/15 0640  CALCIUM 8.7* 8.4* 8.5*   CBC  Recent Labs Lab 05/17/15 0455 05/19/15 0552 05/20/15 0640  WBC 11.1* 10.6* 16.2*  HGB 8.2* 7.4* 8.1*  HCT 25.0* 23.0* 24.7*  PLT 259 273 277   Coag's No results for input(s): APTT, INR in the last 168 hours.  Sepsis Markers  Recent Labs Lab 05/16/15 1832 05/17/15 0455 05/18/15 0400  PROCALCITON 1.20 1.67 0.88   ABG  Recent Labs Lab 05/19/15 2103  PHART 7.438  PCO2ART 33.9*  PO2ART 45.4*   Liver Enzymes  Recent Labs Lab 05/17/15 0455  AST 21  ALT 19  ALKPHOS 57  BILITOT 0.9  ALBUMIN 2.5*    Cardiac Enzymes No results for input(s): TROPONINI, PROBNP in the last 168 hours.  Glucose  Recent Labs Lab 05/18/15 0812 05/19/15 0800 05/20/15 0751  GLUCAP 89 87 98    Imaging Dg Chest Port 1 View  05/19/2015  CLINICAL DATA:  Respiratory distress.  Decreased oxygen saturations. EXAM: PORTABLE CHEST 1 VIEW COMPARISON:  05/19/2015 FINDINGS: There is a right arm PICC line with tip in the cavoatrial junction. Normal heart size. Bilateral airspace and interstitial opacities are not  significantly improved from previous exam. IMPRESSION: 1. No change in aeration to the lungs compared with previous exam. Electronically Signed   By: Signa Kellaylor  Stroud M.D.   On: 05/19/2015 21:03     STUDIES:  03/10/2015 Echo: EF  60-65%  CULTURES: Sputum - none obtained Blood x 2  5/5 >>NTD Urine  5/5 >>NTD Urine 4/7- Enterococcus R to Vanc, S to Linezolid Blood 5/9>>> Sputum 5/9>>>  ANTIBIOTICS: Vanc 5/5> Zosyn 5/5>5/9 Ceftaz 5/9>>>  SIGNIFICANT EVENTS: 5/5 > worsening cough which provoked emesis event leading to desaturations and  increased WOB. CXR 5/4 with  Interstitial edema and bilateral infiltrates. 5/5 CXR shows continued mild interstitial edema,bilateral basilar atelectasis or infiltrate 5/9 > Worsening O2 demands overnight now up to 100% NRB with HFNC underneath. He is still SOB despite this and tachypneic with rate 39. Transferred to ICU and intubated.  LINES/TUBES: Right PICC 05/04>>  DISCUSSION: 40 year old autistic male who had a prolonged hospitalization requiring tracheostomy who was decannulated then sent to CIR. In CIR the patient developed right sided infiltrate and PCCM was consulted 5/3. Started on vanco / zosyn / levaquin; the levaquin was stopped 5/4. He has bilateral basilar infiltrates vs atx, dry cough. On 5/5 he developed desaturation to 70's, placed on 1.00 mask. He is awake, continues to cough w no sputum. Procalcitonin initially <0.10, but empiric antibiotics started. Acute on chronic hypoxemia with cough, concerning for HCAP vs aspiration pneumonitis/PNA:   ASSESSMENT / PLAN:  PULMONARY A: Acute on Chronic hypoxemia with cough concerning for HCAP vs. Pneumonitis/ PNA/ Atelectasis Aspiration Risk/ Respiratory compromise Procalcitonin 1.67, WBC 5/5 16.3> 5/6 11.1 on VancZosyn P:  Intubate. PEEP to 12. Daily CXR Abx as below. Cultures as above. Titrate O2 for sat of 88-92%. D/C diureses. Aspiration Precautions.  CARDIOVASCULAR A:  Sinus Tachycardia - with low BPs, addressed w fluid boluses. No acute issues P:  Tele monitoring DC lopressor with BP  RENAL A: Elevated CR - creatinine rising on vanco, positive fluid balance P:   Trend BMET daily Replete electrolytes as needed Strict I&O Hold diureses today, will likely need diureses once hemodynamically more stable.  GASTROINTESTINAL A:   Emesis - 5/5, resolved P:   Consult nutrition for TF. Pepcid  HEMATOLOGIC A:   VTE Prophylaxis Anemia  Sickle Cell Trait P:  SCD's / Heparin CBC in am Trend CBC Transfuse for  HGB <7  INFECTIOUS A:   Pneumonitis or/ and. Pulmonary edema with new bilateral infiltrates. Potentially aspiration pneumonia.  WBC and Procalcitonin up P:   D/C rocephin. Ceftaz/vanc. Follow cultures as above  CBC daily Trend WBC/ Fever curve  ENDOCRINE A:   No Acute Issues P:   Monitor serum glucose Add SSI if hyperglycemic  NEUROLOGIC A:   No acute issues P:   Monitor for AMS Neuro check every 4 hours  FAMILY:  Spoke with sister at length, full code for now.  Discussing code status.  - Inter-disciplinary family meet or Palliative Care meeting due by:  5/12  The patient is critically ill with multiple organ systems failure and requires high complexity decision making for assessment and support, frequent evaluation and titration of therapies, application of advanced monitoring technologies and extensive interpretation of multiple databases.   Critical Care Time devoted to patient care services described in this note is  35  Minutes. This time reflects time of care of this signee Dr Koren BoundWesam Yacoub. This critical care time does not reflect procedure time, or teaching time or supervisory time of  PA/NP/Med student/Med Resident etc but could involve care discussion time.  Alyson Reedy, M.D. [facility] Pulmonary/Critical Care Medicine. Pager: [phone]. After hours pager: [phone].  05/20/2015 1:18 PM

## 2015-05-20 NOTE — Progress Notes (Signed)
Pharmacy Antibiotic Note  Juan RipperRobert Cameron is a 40 y.o. male admitted on 05/16/2015 with pneumonia. Today is Day #7 antibiotics. Narrowed to rocephin yesterday, but pt spiked fevers again with Tmax 103.8253m wbc trending back up to 16.2, plan to restart vancomycin and fortazand recheck blood culture. Noted he was on vancomycin 1g Q8 hrs and had AKI scr 1.1 >> 1.6, stable at 1.55 today. Est. Current vancomycin level down to < 10  Previous vancomycin levels trough = 57 @ 0400 on 5/7 random = 24 @ 0553 on 5/8 Est. T1/2 ~ 20 hrs, Ke = 0.033  Plan: - Vancomycin 1500 mg IV Q 24 hrs - Fortaz 2g IV Q 8 hrs - monitor renal function, f/u cultures - vancomycin trough at steady state.  Height: 5\' 9"  (175.3 cm) Weight: 198 lb 10.2 oz (90.1 kg) IBW/kg (Calculated) : 70.7  Temp (24hrs), Avg:100.7 F (38.2 C), Min:98.6 F (37 C), Max:103.2 F (39.6 C)   Recent Labs Lab 05/15/15 0442 05/16/15 1832 05/17/15 0455 05/18/15 0400 05/19/15 0552 05/19/15 0553 05/20/15 0640  WBC 10.8* 16.3* 11.1*  --  10.6*  --  16.2*  CREATININE  --   --  1.62*  --  1.60*  --  1.55*  VANCOTROUGH  --   --   --  5957*  --   --   --   VANCORANDOM  --   --   --   --   --  24  --     Estimated Creatinine Clearance: 71.7 mL/min (by C-G formula based on Cr of 1.55).    Allergies  Allergen Reactions  . Peanuts [Peanut Oil] Anaphylaxis    Antimicrobials this admission: 5/2 Levaquin >> 5/3 5/3 Zosyn >> 5/8 5/3 Vancomycin >> 5/6, 5/9 >> 5/8 rocephin >> 6/9 5/9 fortaz >>  Dose adjustments this admission: 5/7 VT: 57 on 1 g IV q8h - doses held 5/8 VR: 24  Microbiology results: 5/2 Urine cx > multiple species 5/5 Urine cx > negative final  Thank you for allowing pharmacy to be a part of this patient's care.  Bayard HuggerMei Emmersen Garraway, PharmD, BCPS  Clinical Pharmacist  Pager: [phone]   05/20/2015 1:36 PM

## 2015-05-20 NOTE — Progress Notes (Signed)
eLink Physician-Brief Progress Note Patient Name: Juan RipperRobert Cameron DOB: 07-25-1975 MRN: [id number]   Date of Service  05/20/2015  HPI/Events of Note  Request for Foley Catheter.   eICU Interventions  Will order Foley Catheter placed.      Intervention Category Intermediate Interventions: Other:  Juan Cameron,Juan Cameron 05/20/2015, 4:29 PM

## 2015-05-20 NOTE — Progress Notes (Signed)
At start of shift, pt temp 103.1.  Gave PO Tylenol.  Temp decreased to 99.5.  When checked again at 11:01, pt temp increased to 103.2 rectal.  I, the RN, noted increased WOB and pt becoming tired.  Pt desats to 60-70% when placed on anything less than 15L HFNC and 15L NRB.  Pt stating "I don't feel good."  Respirations 30-50 and HR 110s-150s.  Rapid RN paged and MD notified.  New orders received.  Sister at bedside.

## 2015-05-20 NOTE — Procedures (Signed)
Intubation Procedure Note Alan RipperRobert Yonke [id number] 19-Aug-1975  Procedure: Intubation Indications: Respiratory insufficiency  Procedure Details Consent: Risks of procedure as well as the alternatives and risks of each were explained to the (patient/caregiver).  Consent for procedure obtained. Time Out: Verified patient identification, verified procedure, site/side was marked, verified correct patient position, special equipment/implants available, medications/allergies/relevent history reviewed, required imaging and test results available.  Performed  Maximum sterile technique was used including gloves, gown, hand hygiene and mask.  MAC    Evaluation Hemodynamic Status: BP stable throughout; O2 sats: stable throughout Patient's Current Condition: stable Complications: No apparent complications Patient did tolerate procedure well. Chest X-ray ordered to verify placement.  CXR: pending.   YACOUB,WESAM 05/20/2015

## 2015-05-20 NOTE — Progress Notes (Signed)
RT coordinated ABG stick with RN.  Pt agitation creates safety risk for accidental stick.  Attempt x1 unsuccessful.  Pt. Desaturated to 89% during attempt, with quick rebound upon abandoning attempt.  RT will continue to monitor.

## 2015-05-20 NOTE — Care Management Note (Signed)
Case Management Note  Patient Details  Name: Alan RipperRobert Seal MRN: [id number] Date of Birth: [birth year]  Subjective/Objective:   Patient is from CIR with pna, resp failure on NRB and HFNC, has temp of 102, patient is autistic, he lives with his sister, [name], [place] will cont to follow for dc needs.                  Action/Plan:   Expected Discharge Date:                  Expected Discharge Plan:  IP Rehab Facility  In-House Referral:  Clinical Social Work  Discharge planning Services  CM Consult  Post Acute Care Choice:    Choice offered to:     DME Arranged:    DME Agency:     HH Arranged:    HH Agency:     Status of Service:  In process, will continue to follow  Medicare Important Message Given:  Yes Date Medicare IM Given:    Medicare IM give by:    Date Additional Medicare IM Given:    Additional Medicare Important Message give by:     If discussed at Long Length of Stay Meetings, dates discussed:    Additional Comments:  Leone Havenaylor, Aiken Withem Clinton, RN 05/20/2015, 12:34 PM

## 2015-05-20 NOTE — Care Management Important Message (Signed)
Important Message  Patient Details  Name: Alan RipperRobert Layson MRN: [id number] Date of Birth: 1975/12/18   Medicare Important Message Given:  Yes    Leone Havenaylor, Brittanyann Wittner Clinton, RN 05/20/2015, 12:18 PMImportant Message  Patient Details  Name: Alan RipperRobert Hochmuth MRN: [id number] Date of Birth: 1975/12/18   Medicare Important Message Given:  Yes    Leone Havenaylor, Craige Patel Clinton, RN 05/20/2015, 12:18 PM

## 2015-05-20 NOTE — Progress Notes (Signed)
eLink Physician-Brief Progress Note Patient Name: Juan RipperRobert Cameron DOB: 23-Sep-1975 MRN: [id number]   Date of Service  05/20/2015  HPI/Events of Note  F/u on pt as he was in distress earlier.  Pt was seen, comfortably sleeping. On NRBM. 105/50, 103, 20-28, 100%  eICU Interventions  Cont NRBM.  Cont present management.  I think part of the issue is anxiety which was addressed earlier        Juan Cameron 05/20/2015, 3:32 AM

## 2015-05-20 NOTE — Progress Notes (Signed)
[place] TEAM 1 - Stepdown/ICU TEAM Progress Note  Juan RipperRobert Cameron ZOX:[id number] DOB: 1975/12/03 DOA: 05/16/2015 PCP: Ananias PilgrimASRES,ALEHEGN, MD  Admit HPI / Brief Narrative: 40 y/o BM PMHx Sickle Cell Trait, PNA, Autism with recent prolonged hospitalization for multilobar PNA with ARDS, AKI requiring HD (resolved, off HD), prolonged respiratory failure s/p tracheostomy. He was discharged to [facility] on 3/27 for further ventilator weaning. At [facility], he was He subsequently was decannulated and [facility].Am of 5/5 2017 pt developed progressive dry cough with acute desaturation into the 70's on 4L min [place], after an episode of emesis. ( ? Desaturation) Placed on 100 % mask, saturations are now 100%, with decreased WOB. Pt. was given 1 dose of Lasix 40 mg IV, no void as of yet.  HPI/Subjective: 5/9 patient alert however does not particularly like males. Had to commence patient to allow me to listen to his lungs. MAXIMUM TEMPERATURE overnight 39.6C  Assessment/Plan: Sepsis, Acute on Chronic hypoxemia with cough concerning for HCAP vs. Pneumonitis/ PNA/ Atelectasis Aspiration Risk/ Respiratory compromise -Patient currently meets sepsis guidelines -Worsening respiratory status overnight. -Likely acid pending -DuoNeb QID  -Continue high flow nasal cannula/nonrebreather to maintain SPO2> 93% -PCXR bilateral worsening diffuse opacification consistent with ARDS -Cooling blanket for temperature> 38.8 C -Contacted PCCM for transfer  Chronic diastolic CHF -Strict in and out since admission +2.1 L -Daily weight Filed Weights   05/18/15 0300 05/19/15 0500 05/20/15 0303  Weight: 92 kg (202 lb 13.2 oz) 92.6 kg (204 lb 2.3 oz) 90.1 kg (198 lb 10.2 oz)  -Lasix IV 60 mg 1 -Hold IV fluids -Transfuse for hemoglobin<8  Sinus Tachycardia  -Most likely multifactorial secondary to patient's sepsis (patient's HR> 90, RR> 20, temp> 38C), chronic CHF, CKD  Acute renal failure (baseline  Cr~1.1)  -Appears to be trending down continue follow closely  Sickle Cell Trait -Transfuse for HGB <8; see chronic diastolic CHF  Baseline Autism  -Per nursing staff patient able to play video games, answer simple questions, does have some difficulty expressing his needs.     Code Status: FULL Family Communication: no family present at time of exam Disposition Plan: Resolution ARDS    [facility]  Procedure/Significant Events: 5/5 > worsening cough which provoked emesis event leading to desaturations and increased WOB. CXR 5/4 with Interstitial edema and bilateral infiltrates. 5/5 CXR shows continued mild interstitial edema,bilateral basilar atelectasis or infiltrate    Culture Sputum- none obtained Blood x 2 5/5 >> Urine 5/5 >> Urine 4/7- Enterococcus R to Vanc, S to Linezolid   Antibiotics: Vanc 5/5>5/8 Zosyn 5/5>  DVT prophylaxis: SCDs and subcutaneous heparin   Devices    LINES / TUBES:      Continuous Infusions:    Objective: VITAL SIGNS: Temp: 103.2 F (39.6 C) (05/09 1101) Temp Source: Rectal (05/09 1101) BP: 125/68 mmHg (05/09 1101) Pulse Rate: 141 (05/09 1135) SPO2; FIO2:   Intake/Output Summary (Last 24 hours) at 05/20/15 1313 Last data filed at 05/20/15 1200  Gross per 24 hour  Intake    670 ml  Output   1550 ml  Net   -880 ml     Exam: General: Alert, characteristics consistent with autism after convinced patient to allow exam, positive acute on chronic respiratory distress Eyes: negative scleral hemorrhage, negative icterus ENT: Negative Runny nose, negative gingival bleeding, Neck:  Negative scars, masses, torticollis, lymphadenopathy, JVD Lungs: diffuse decreased breath sounds, negative wheezes  Cardiovascular: Tachycardic, Regular rhythm without murmur gallop or rub normal  S1 and S2 Abdomen:negative abdominal pain, nondistended, positive soft, bowel sounds, no rebound, no ascites, no appreciable  mass Extremities: No significant cyanosis, clubbing, or edema bilateral lower extremities Psychiatric:  Autistic,  Neurologic:  Cranial nerves II through XII intact, tongue/uvula midline, moves all extremities to command,  negative dysarthria, negative expressive aphasia, negative receptive aphasia.   Data Reviewed: Basic Metabolic Panel:  Recent Labs Lab 05/17/15 0455 05/19/15 0552 05/20/15 0640  NA 138 142 139  K 3.9 3.7 3.7  CL 100* 109 103  CO2 26 22 23   GLUCOSE 90 92 109*  BUN 16 11 12   CREATININE 1.62* 1.60* 1.55*  CALCIUM 8.7* 8.4* 8.5*   Liver Function Tests:  Recent Labs Lab 05/17/15 0455  AST 21  ALT 19  ALKPHOS 57  BILITOT 0.9  PROT 5.9*  ALBUMIN 2.5*   No results for input(s): LIPASE, AMYLASE in the last 168 hours. No results for input(s): AMMONIA in the last 168 hours. CBC:  Recent Labs Lab 05/15/15 0442 05/16/15 1832 05/17/15 0455 05/19/15 0552 05/20/15 0640  WBC 10.8* 16.3* 11.1* 10.6* 16.2*  NEUTROABS 6.6 12.1*  --  7.0  --   HGB 9.1* 8.3* 8.2* 7.4* 8.1*  HCT 27.6* 25.4* 25.0* 23.0* 24.7*  MCV 83.6 81.4 83.6 81.9 83.2  PLT 242 263 259 273 277   Cardiac Enzymes: No results for input(s): CKTOTAL, CKMB, CKMBINDEX, TROPONINI in the last 168 hours. BNP (last 3 results)  Recent Labs  03/07/15 1303 03/11/15 0630  BNP 17.2 20.6    ProBNP (last 3 results) No results for input(s): PROBNP in the last 8760 hours.  CBG:  Recent Labs Lab 05/18/15 0812 05/19/15 0800 05/20/15 0751  GLUCAP 89 87 98    Recent Results (from the past 240 hour(s))  Urine culture     Status: Abnormal   Collection Time: 05/13/15  3:40 PM  Result Value Ref Range Status   Specimen Description URINE, CLEAN CATCH  Final   Special Requests NONE  Final   Culture MULTIPLE SPECIES PRESENT, SUGGEST RECOLLECTION (A)  Final   Report Status 05/15/2015 FINAL  Final  Urine culture     Status: None   Collection Time: 05/16/15  6:00 PM  Result Value Ref Range Status    Specimen Description URINE, CLEAN CATCH  Final   Special Requests NONE  Final   Culture NO GROWTH 2 DAYS  Final   Report Status 05/18/2015 FINAL  Final  MRSA PCR Screening     Status: Abnormal   Collection Time: 05/18/15  7:49 AM  Result Value Ref Range Status   MRSA by PCR POSITIVE (A) NEGATIVE Final    Comment:        The GeneXpert MRSA Assay (FDA approved for NASAL specimens only), is one component of a comprehensive MRSA colonization surveillance program. It is not intended to diagnose MRSA infection nor to guide or monitor treatment for MRSA infections. RESULT CALLED TO, READ BACK BY AND VERIFIED WITH: H.MILLS RN AT 1012 ON 05/18/15 BY A.DAVIS   Culture, blood (Routine X 2) w Reflex to ID Panel     Status: None (Preliminary result)   Collection Time: 05/20/15 12:20 PM  Result Value Ref Range Status   Specimen Description BLOOD LEFT HAND  Final   Special Requests IN PEDIATRIC BOTTLE 2.5CC  Final   Culture PENDING  Incomplete   Report Status PENDING  Incomplete     Studies:  Recent x-ray studies have been reviewed in detail by the Attending Physician  Scheduled Meds:  Scheduled Meds: . antiseptic oral rinse  7 mL Mouth Rinse BID  . cefTRIAXone (ROCEPHIN)  IV  1 g Intravenous Q24H  . Chlorhexidine Gluconate Cloth  6 each Topical Q0600  . clonazePAM  0.5 mg Oral QHS  . diltiazem  60 mg Oral Q8H  . docusate sodium  100 mg Oral BID  . famotidine  20 mg Oral BID  . furosemide  60 mg Intravenous Once  . heparin subcutaneous  5,000 Units Subcutaneous Q8H  . ipratropium-albuterol  3 mL Nebulization Q6H  . modafinil  50 mg Oral Daily  . mupirocin ointment  1 application Nasal BID  . sodium chloride flush  3 mL Intravenous Q12H    Time spent on care of this patient: 40 mins   Elexis Pollak, Roselind Messier , MD  [facility] Office  [phone] Pager - [phone]  On-Call/Text Page:      [email or web address]      password [email or web address]  If 7PM-7AM, please contact  night-coverage [email or web address] Password [email or web address] 05/20/2015, 1:13 PM   LOS: 4 days   Care during the described time interval was provided by me .  I have reviewed this patient's available data, including medical history, events of note, physical examination, and all test results as part of my evaluation. I have personally reviewed and interpreted all radiology studies.   Carolyne Littles, MD [phone] Pager

## 2015-05-20 NOTE — Progress Notes (Signed)
Initial Nutrition Assessment  DOCUMENTATION CODES:   Not applicable  INTERVENTION:    Initiate TF via OGT with Vital AF 1.2 at 25 ml/h, increase to goal rate of 75 ml/h (1800 ml per day) to provide 2160 kcals, 135 gm protein, 1460 ml free water daily.  Prostat 30 ml BID on day 1.  NUTRITION DIAGNOSIS:   Inadequate oral intake related to inability to eat as evidenced by NPO status.  GOAL:   Patient will meet greater than or equal to 90% of their needs  MONITOR:   Vent status, Labs, Weight trends, TF tolerance, I & O's  REASON FOR ASSESSMENT:   Consult Enteral/tube feeding initiation and management  ASSESSMENT:      40 y/o M with PMH of sickle cell trait, PNA and autism with recent prolonged hospitalization for multilobar PNA with ARDS, AKI requiring HD (resolved, off HD), prolonged respiratory failure s/p tracheostomy. [facility] we[facility]. At [facility], he was He subsequently was decannulated and [facility].Am of 5/5 2017 pt developed progressive dry cough with acute desaturation into the 70's on 4L min [place], transferred to SDU.   Required transfer to ICU and intubation on 5/9. Patient is currently intubated on ventilator support MV: 12.1 L/min Temp (24hrs), Avg:100.9 F (38.3 C), Min:98.6 F (37 C), Max:103.2 F (39.6 C)  Propofol: none  Diet Order:  Diet regular Room service appropriate?: Yes; Fluid consistency:: Thin  Skin:  Reviewed, no issues  Last BM:  5/8  Height:   Ht Readings from Last 1 Encounters:  05/20/15 5\' 9"  (1.753 m)    Weight:   Wt Readings from Last 1 Encounters:  05/20/15 198 lb 10.2 oz (90.1 kg)    Ideal Body Weight:  72.7 kg  BMI:  Body mass index is 29.32 kg/(m^2).  Estimated Nutritional Needs:   Kcal:  2250  Protein:  125-145 gm  Fluid:  2.3 L  EDUCATION NEEDS:   Education needs addressed  Joaquin CourtsKimberly Betzalel Umbarger, RD, LDN, CNSC Pager [phone] After  Hours Pager [phone]

## 2015-05-21 ENCOUNTER — Inpatient Hospital Stay (HOSPITAL_COMMUNITY): Payer: Medicare Other

## 2015-05-21 DIAGNOSIS — N179 Acute kidney failure, unspecified: Secondary | ICD-10-CM

## 2015-05-21 LAB — CBC
HCT: 23.2 % — ABNORMAL LOW (ref 39.0–52.0)
HEMOGLOBIN: 7.4 g/dL — AB (ref 13.0–17.0)
MCH: 26.2 pg (ref 26.0–34.0)
MCHC: 31.9 g/dL (ref 30.0–36.0)
MCV: 82.3 fL (ref 78.0–100.0)
Platelets: 297 10*3/uL (ref 150–400)
RBC: 2.82 MIL/uL — AB (ref 4.22–5.81)
RDW: 15.5 % (ref 11.5–15.5)
WBC: 15.8 10*3/uL — AB (ref 4.0–10.5)

## 2015-05-21 LAB — MAGNESIUM: Magnesium: 1.8 mg/dL (ref 1.7–2.4)

## 2015-05-21 LAB — BASIC METABOLIC PANEL
Anion gap: 11 (ref 5–15)
BUN: 16 mg/dL (ref 6–20)
CALCIUM: 8.7 mg/dL — AB (ref 8.9–10.3)
CO2: 24 mmol/L (ref 22–32)
CREATININE: 1.7 mg/dL — AB (ref 0.61–1.24)
Chloride: 109 mmol/L (ref 101–111)
GFR calc Af Amer: 57 mL/min — ABNORMAL LOW (ref >60)
GFR calc non Af Amer: 49 mL/min — ABNORMAL LOW (ref >60)
GLUCOSE: 93 mg/dL (ref 65–99)
Potassium: 4 mmol/L (ref 3.5–5.1)
Sodium: 144 mmol/L (ref 135–145)

## 2015-05-21 LAB — BLOOD GAS, ARTERIAL
ACID-BASE DEFICIT: 1.9 mmol/L (ref 0.0–2.0)
Bicarbonate: 23.6 mEq/L (ref 20.0–24.0)
DRAWN BY: 419771
FIO2: 0.5
LHR: 18 {breaths}/min
MECHVT: 560 mL
O2 SAT: 94.1 %
PEEP/CPAP: 8 cmH2O
PH ART: 7.295 — AB (ref 7.350–7.450)
PO2 ART: 73.7 mmHg — AB (ref 80.0–100.0)
Patient temperature: 98.6
TCO2: 25.2 mmol/L (ref 0–100)
pCO2 arterial: 50.1 mmHg — ABNORMAL HIGH (ref 35.0–45.0)

## 2015-05-21 LAB — GLUCOSE, CAPILLARY
GLUCOSE-CAPILLARY: 101 mg/dL — AB (ref 65–99)
GLUCOSE-CAPILLARY: 78 mg/dL (ref 65–99)
Glucose-Capillary: 89 mg/dL (ref 65–99)
Glucose-Capillary: 87 mg/dL (ref 65–99)
Glucose-Capillary: 92 mg/dL (ref 65–99)
Glucose-Capillary: 95 mg/dL (ref 65–99)

## 2015-05-21 LAB — PHOSPHORUS: Phosphorus: 3.2 mg/dL (ref 2.5–4.6)

## 2015-05-21 MED ORDER — DEXMEDETOMIDINE HCL IN NACL 200 MCG/50ML IV SOLN
0.4000 ug/kg/h | INTRAVENOUS | Status: DC
  Administered 2015-05-21 (×2): 0.4 ug/kg/h via INTRAVENOUS
  Administered 2015-05-22: 0.6 ug/kg/h via INTRAVENOUS
  Filled 2015-05-21 (×3): qty 50

## 2015-05-21 MED ORDER — CHLORHEXIDINE GLUCONATE 0.12% ORAL RINSE (MEDLINE KIT)
15.0000 mL | Freq: Two times a day (BID) | OROMUCOSAL | Status: DC
  Administered 2015-05-21 – 2015-06-10 (×38): 15 mL via OROMUCOSAL

## 2015-05-21 MED ORDER — PANTOPRAZOLE SODIUM 40 MG IV SOLR
40.0000 mg | INTRAVENOUS | Status: DC
  Administered 2015-05-21 – 2015-05-22 (×3): 40 mg via INTRAVENOUS
  Filled 2015-05-21 (×3): qty 40

## 2015-05-21 MED ORDER — ANTISEPTIC ORAL RINSE SOLUTION (CORINZ)
7.0000 mL | Freq: Four times a day (QID) | OROMUCOSAL | Status: DC
  Administered 2015-05-21 – 2015-06-10 (×66): 7 mL via OROMUCOSAL

## 2015-05-21 MED ORDER — VITAL AF 1.2 CAL PO LIQD
1000.0000 mL | ORAL | Status: DC
  Administered 2015-05-21: 1000 mL

## 2015-05-21 NOTE — Clinical Documentation Improvement (Signed)
Critical Care  Can the diagnosis of systemic infection be further specified? Please clarify if possible if dx was POA.   Sepsis - specify causative organism if known}  Other  Clinically Undetermined  Document any associated diagnoses/conditions. Supporting Information: 05/20/15 note: Assessment/Plan: Sepsis, Acute on Chronic hypoxemia with cough concerning for HCAP vs. Pneumonitis/ PNA/ Atelectasis Aspiration Risk/ Respiratory compromise -Patient currently meets sepsis guidelines   Please exercise your independent, professional judgment when responding. A specific answer is not anticipated or expected. Please update your documentation within the medical record to reflect your response to this query. Thank you  Thank Barrie DunkerYou,  Presley Summerlin C Scottlynn Lindell Health Information Management  [phone]

## 2015-05-21 NOTE — Progress Notes (Signed)
SLP Cancellation Note  Patient Details Name: Alan RipperRobert Melroy MRN: [id number] DOB: [birth year]   Cancelled treatment:       Reason Eval/Treat Not Completed: Medical issues which prohibited therapy. Pt now intubated.   Maxcine HamLaura Paiewonsky, M.A. CCC-SLP [phone]  Maxcine Hamaiewonsky, Edythe Riches 05/21/2015, 8:46 AM

## 2015-05-21 NOTE — Progress Notes (Signed)
RT increased PEEP and FIO2 due to sats in low 80's. Will cont to monitor and wean back down. Will notify MD.

## 2015-05-21 NOTE — Progress Notes (Signed)
PULMONARY / CRITICAL CARE MEDICINE   Name: Walter Grima MRN: [id number] DOB: 1975/01/14    ADMISSION DATE: 05/16/2015 CONSULTATION DATE: 05/16/2015  REFERRING MD:  Rehab MD  CHIEF COMPLAINT: Respiratory Distress/ Hypoxemia  HISTORY OF PRESENT ILLNESS:   40 y/o M with PMH of sickle cell trait, PNA and autism with recent prolonged hospitalization for multilobar PNA with ARDS, AKI requiring HD (resolved, off HD), prolonged respiratory failure s/p tracheostomy. He was discharged to [facility] on 3/27 for further ventilator weaning. At [facility], he was He subsequently was decannulated and transferred to [facility] on 4/25.Am of 5/5 2017 pt developed progressive dry cough with acute desaturation into the 70's on 4L min [place], after an episode of emesis. ( ? Desaturation) Placed on 100 % mask,  saturations are now 100%, with decreased WOB. Pt. was given 1 dose of Lasix 40 mg IV, no void as of yet.  SUBJECTIVE:  Intubated , sedated, critically ill On PEEP 8 Febrile overnight Good UO   HEMODYNAMICS:  Temp:  [99.1 F (37.3 C)-103.3 F (39.6 C)] 99.5 F (37.5 C) (05/10 0900) Pulse Rate:  [103-155] 124 (05/10 0900) Resp:  [16-39] 21 (05/10 0900) BP: (71-143)/(37-92) 120/65 mmHg (05/10 0900) SpO2:  [76 %-100 %] 98 % (05/10 0900) FiO2 (%):  [40 %-100 %] 50 % (05/10 0800) Weight:  [202 lb 13.2 oz (92 kg)] 202 lb 13.2 oz (92 kg) (05/10 0500)   Vent Mode:  [-] PRVC FiO2 (%):  [40 %-100 %] 50 % Set Rate:  [18 bmp] 18 bmp Vt Set:  [560 mL] 560 mL PEEP:  [8 cmH20] 8 cmH20 Plateau Pressure:  [31 cmH20-42 cmH20] 35 cmH20 INTAKE / OUTPUT: I/O last 3 completed shifts: In: 1422 [I.V.:772; IV Piggyback:650] Out: 2420 [Urine:2420]  PHYSICAL EXAMINATION: General No distress,  A&Ox2, developmentally delayed, awake ENT: [place]/AT, PERRL, no JVD Cardiac: S1, S2, regular rate and rhythm, no murmur Chest: no acc muscles,decreased BS BL Abd.: Soft, Non-tender, BS + Ext: No clubbing cyanosis, edema or  tenderness Neuro:  Non-focal Skin: No rashes, warm and dry, intact Psych: calm  LABS:  BMET  Recent Labs Lab 05/19/15 0552 05/20/15 0640 05/21/15 0420  NA 142 139 144  K 3.7 3.7 4.0  CL 109 103 109  CO2 BUN CREATININE 1.60* 1.55* 1.70*  GLUCOSE 92 109* 93   Electrolytes  Recent Labs Lab 05/19/15 0552 05/20/15 0640 05/21/15 0420  CALCIUM 8.4* 8.5* 8.7*  MG  --   --  1.8  PHOS  --   --  3.2   CBC  Recent Labs Lab 05/19/15 0552 05/20/15 0640 05/21/15 0420  WBC 10.6* 16.2* 15.8*  HGB 7.4* 8.1* 7.4*  HCT 23.0* 24.7* 23.2*  PLT 273 277 297   Coag's No results for input(s): APTT, INR in the last 168 hours.  Sepsis Markers  Recent Labs Lab 05/16/15 1832 05/17/15 0455 05/18/15 0400 05/20/15 1457  LATICACIDVEN  --   --   --  1.1  PROCALCITON 1.20 1.67 0.88  --    ABG  Recent Labs Lab 05/19/15 2103 05/21/15 0543  PHART 7.438 7.295*  PCO2ART 33.9* 50.1*  PO2ART 45.4* 73.7*   Liver Enzymes  Recent Labs Lab 05/17/15 0455  AST 21  ALT 19  ALKPHOS 57  BILITOT 0.9  ALBUMIN 2.5*    Cardiac Enzymes No results for input(s): TROPONINI, PROBNP in the last 168 hours.  Glucose  Recent Labs Lab 05/20/15 1413 05/20/15 1549 05/20/15  2008 05/20/15 2353 05/21/15 0348 05/21/15 0750  GLUCAP 100* 100* 101* 101* 89 87    Imaging Dg Chest Port 1 View  05/21/2015  CLINICAL DATA:  40 year old male with respiratory distress. Sickle cell trait. Initial encounter. EXAM: PORTABLE CHEST 1 VIEW COMPARISON:  05/20/2015 and earlier. FINDINGS: Portable AP semi upright view at 0458 hours. Stable endotracheal tube tip in good position between the level the clavicles and carina. Enteric tube has been placed and loops at the level of the stomach in the left upper quadrant. Stable right PICC line. Continued low lung volumes with widespread coarse and confluent bilateral pulmonary opacity -basilar predominant. Stable cardiac size and mediastinal  contours. No pneumothorax. No large pleural effusion. Ventilation has not significantly changed since 05/16/2015. IMPRESSION: 1. Enteric tube placed, loops at the level of the stomach with tip not included. 2.  Otherwise, stable lines and tubes. 3. Stable ventilation since 05/16/2015 with widespread coarse and confluent pulmonary opacity. Electronically Signed   By: Odessa FlemingH  Hall M.D.   On: 05/21/2015 07:15   Dg Chest Port 1 View  05/20/2015  CLINICAL DATA:  Status post intubation. EXAM: PORTABLE CHEST 1 VIEW COMPARISON:  05/19/2015 FINDINGS: New endotracheal tube tip projects 2 cm above the carina. Right PICC is stable in well positioned. Bilateral irregular interstitial and patchy airspace opacities are without significant change from the prior exam. No new lung abnormalities. No obvious pleural effusion and no pneumothorax. IMPRESSION: 1. Endotracheal tube is well positioned. 2. No other change from the prior study. Electronically Signed   By: Amie Portlandavid  Ormond M.D.   On: 05/20/2015 15:33   Dg Abd Portable 1v  05/20/2015  CLINICAL DATA:  Orogastric tube placement. Acute respiratory failure with hypoxia. EXAM: PORTABLE ABDOMEN - 1 VIEW COMPARISON:  04/09/2015 FINDINGS: Orogastric tube is seen with tip overlying the distal gastric antrum or pylorus. Mild gaseous distention of transverse colon several small bowel loops seen, likely due to mild ileus. Heterogeneous bibasilar pulmonary airspace disease also noted. IMPRESSION: Orogastric tube tip overlies the distal gastric antrum or pylorus. Probable mild ileus. Electronically Signed   By: Myles RosenthalJohn  Stahl M.D.   On: 05/20/2015 23:08     STUDIES:  03/10/2015 Echo: EF  60-65%  CULTURES: Sputum - none obtained Blood x 2  5/5 >>NTD Urine  5/5 >>NTD Urine 4/7- Enterococcus R to Vanc, S to Linezolid Blood 5/9>>> Sputum 5/9>>>  ANTIBIOTICS: Vanc 5/3>> 5/6, 5/9 >> Zosyn 5/5>5/9 Ceftaz 5/9>>>  SIGNIFICANT EVENTS: 5/5 > worsening cough which provoked emesis event  leading to desaturations and increased WOB. CXR 5/4 with  Interstitial edema and bilateral infiltrates. 5/5 CXR shows continued mild interstitial edema,bilateral basilar atelectasis or infiltrate 5/9 > Worsening O2 demands overnight now up to 100% NRB with HFNC underneath. He is still SOB despite this and tachypneic with rate 39. Transferred to ICU and intubated.  LINES/TUBES: Right PICC 05/04>>  DISCUSSION: 40 year old autistic male who had a prolonged hospitalization requiring tracheostomy who was decannulated then sent to CIR. In CIR the patient developed right sided infiltrate and PCCM was consulted 5/3. Started on vanco / zosyn / levaquin; the levaquin was stopped 5/4. He has bilateral basilar infiltrates vs atx, dry cough. On 5/5 he developed desaturation to 70's, placed on 1.00 mask. He is awake, continues to cough w no sputum. Procalcitonin initially <0.10, but empiric antibiotics started. Acute on chronic hypoxemia with cough, concerning for HCAP vs aspiration pneumonitis/PNA:   ASSESSMENT / PLAN:  PULMONARY A: ARDS- Acute on Chronic hypoxemia  with cough concerning for HCAP vs. Pneumonitis/ PNA/ Atelectasis Aspiration Risk/ Respiratory compromise Procalcitonin 1.67, WBC 5/5 16.3> 5/6 11.1 on VancZosyn P:  Drop PEEP to 5. Titrate O2 for sat of 88-92%.   CARDIOVASCULAR A:  Sinus Tachycardia - with low BPs, addressed w fluid boluses. No acute issues P:  Tele  DC lopressor with BP  RENAL A: Elevated CR - creatinine rising on vanco, positive fluid balance -new baseline 1.1 P:   Trend BMET daily Replete electrolytes as needed Strict I&O will likely need diureses once hemodynamically more stable.  GASTROINTESTINAL A:   Emesis - 5/5, resolved Ileus  P:   Low doseTF. Pepcid Aspiration Precautions.  HEMATOLOGIC A:   VTE Prophylaxis Anemia  Sickle Cell Trait P:  SCD's / Heparin CBC in am Trend CBC Transfuse for HGB <7  INFECTIOUS A:   Pneumonitis or/ and.  Pulmonary edema with new bilateral infiltrates. Potentially aspiration pneumonia.  WBC and Procalcitonin up P:   Ceftaz/vanc. Follow cultures as above & narrow CBC daily Trend WBC/ Fever curve  ENDOCRINE A:   No Acute Issues P:   Monitor serum glucose Add SSI if hyperglycemic  NEUROLOGIC A:   Autism P:   Fent gtt Dc versed - add precedex   FAMILY:  Spoke with sister at length, full code for now.  Discussing code status.  - Inter-disciplinary family meet or Palliative Care meeting due by:  5/12  Summary - ARDS related to aspiration, resolving, hope to extubate eventually  The patient is critically ill with multiple organ systems failure and requires high complexity decision making for assessment and support, frequent evaluation and titration of therapies, application of advanced monitoring technologies and extensive interpretation of multiple databases. Critical Care Time devoted to patient care services described in this note independent of APP time is 35 minutes.   Cyril Mourning MD. Tonny Bollman. [place] [facility] Pager [phone] If no response call [phone]    05/21/2015 10:16 AM

## 2015-05-21 NOTE — Progress Notes (Signed)
eLink Physician-Brief Progress Note Patient Name: Alan RipperRobert Dlugosz DOB: [birth year] MRN: [id number]   Date of Service  05/21/2015  HPI/Events of Note  abd xray with ileues  eICU Interventions  Will hold off on TF. Will switch PPI to IV Need to change other PO meds in am. Holding off on PO cardizem as BP has been N (was lower post intubation).         Deziah Renwick Bridgette Habermannngelo A De Dios 05/21/2015, 12:23 AM

## 2015-05-22 ENCOUNTER — Inpatient Hospital Stay (HOSPITAL_COMMUNITY): Payer: Medicare Other

## 2015-05-22 LAB — BASIC METABOLIC PANEL
Anion gap: 12 (ref 5–15)
BUN: 17 mg/dL (ref 6–20)
CALCIUM: 8.8 mg/dL — AB (ref 8.9–10.3)
CO2: 22 mmol/L (ref 22–32)
CREATININE: 1.74 mg/dL — AB (ref 0.61–1.24)
Chloride: 109 mmol/L (ref 101–111)
GFR calc Af Amer: 56 mL/min — ABNORMAL LOW (ref >60)
GFR, EST NON AFRICAN AMERICAN: 48 mL/min — AB (ref >60)
Glucose, Bld: 90 mg/dL (ref 65–99)
Potassium: 3.8 mmol/L (ref 3.5–5.1)
Sodium: 143 mmol/L (ref 135–145)

## 2015-05-22 LAB — CBC
HCT: 23.8 % — ABNORMAL LOW (ref 39.0–52.0)
Hemoglobin: 7.6 g/dL — ABNORMAL LOW (ref 13.0–17.0)
MCH: 26.3 pg (ref 26.0–34.0)
MCHC: 31.9 g/dL (ref 30.0–36.0)
MCV: 82.4 fL (ref 78.0–100.0)
Platelets: 358 10*3/uL (ref 150–400)
RBC: 2.89 MIL/uL — ABNORMAL LOW (ref 4.22–5.81)
RDW: 15.8 % — AB (ref 11.5–15.5)
WBC: 17.6 10*3/uL — ABNORMAL HIGH (ref 4.0–10.5)

## 2015-05-22 LAB — GLUCOSE, CAPILLARY
GLUCOSE-CAPILLARY: 80 mg/dL (ref 65–99)
GLUCOSE-CAPILLARY: 84 mg/dL (ref 65–99)
GLUCOSE-CAPILLARY: 89 mg/dL (ref 65–99)
GLUCOSE-CAPILLARY: 96 mg/dL (ref 65–99)
GLUCOSE-CAPILLARY: 97 mg/dL (ref 65–99)
Glucose-Capillary: 89 mg/dL (ref 65–99)

## 2015-05-22 MED ORDER — MIDAZOLAM HCL 2 MG/2ML IJ SOLN
2.0000 mg | Freq: Once | INTRAMUSCULAR | Status: AC
  Administered 2015-05-22: 2 mg via INTRAVENOUS

## 2015-05-22 MED ORDER — MIDAZOLAM HCL 2 MG/2ML IJ SOLN
1.0000 mg | INTRAMUSCULAR | Status: DC | PRN
  Administered 2015-05-23 – 2015-05-28 (×21): 2 mg via INTRAVENOUS
  Filled 2015-05-22 (×22): qty 2

## 2015-05-22 MED ORDER — SODIUM CHLORIDE 0.9 % IV BOLUS (SEPSIS)
500.0000 mL | Freq: Once | INTRAVENOUS | Status: AC
  Administered 2015-05-22: 500 mL via INTRAVENOUS

## 2015-05-22 MED ORDER — CLONAZEPAM 0.5 MG PO TABS
1.0000 mg | ORAL_TABLET | Freq: Two times a day (BID) | ORAL | Status: DC
Start: 2015-05-22 — End: 2015-05-25
  Administered 2015-05-22 – 2015-05-25 (×7): 1 mg via ORAL
  Filled 2015-05-22 (×7): qty 2

## 2015-05-22 MED ORDER — SODIUM CHLORIDE 0.9 % IV SOLN: INTRAVENOUS | Status: DC

## 2015-05-22 MED ORDER — QUETIAPINE FUMARATE 100 MG PO TABS
100.0000 mg | ORAL_TABLET | Freq: Two times a day (BID) | ORAL | Status: DC
  Administered 2015-05-22 – 2015-05-23 (×4): 100 mg via ORAL
  Filled 2015-05-22 (×6): qty 1

## 2015-05-22 MED ORDER — VITAL AF 1.2 CAL PO LIQD
1000.0000 mL | ORAL | Status: DC
  Administered 2015-05-23: 1000 mL

## 2015-05-22 MED ORDER — DEXMEDETOMIDINE HCL IN NACL 400 MCG/100ML IV SOLN
0.4000 ug/kg/h | INTRAVENOUS | Status: DC
  Administered 2015-05-22 (×2): 0.5 ug/kg/h via INTRAVENOUS
  Administered 2015-05-23 (×5): 1 ug/kg/h via INTRAVENOUS
  Administered 2015-05-24: 1.1 ug/kg/h via INTRAVENOUS
  Administered 2015-05-24 (×2): 0.8 ug/kg/h via INTRAVENOUS
  Administered 2015-05-25: 1.2 ug/kg/h via INTRAVENOUS
  Administered 2015-05-25: 0.8 ug/kg/h via INTRAVENOUS
  Administered 2015-05-25: 1.2 ug/kg/h via INTRAVENOUS
  Administered 2015-05-25: 0.8 ug/kg/h via INTRAVENOUS
  Administered 2015-05-26 – 2015-05-29 (×16): 1.2 ug/kg/h via INTRAVENOUS
  Filled 2015-05-22 (×39): qty 100

## 2015-05-22 MED ORDER — MIDAZOLAM HCL 2 MG/2ML IJ SOLN
INTRAMUSCULAR | Status: AC
  Filled 2015-05-22: qty 2

## 2015-05-22 MED ORDER — NOREPINEPHRINE BITARTRATE 1 MG/ML IV SOLN
2.0000 ug/min | INTRAVENOUS | Status: DC
  Administered 2015-05-22: 2 ug/min via INTRAVENOUS
  Administered 2015-05-23 (×2): 7 ug/min via INTRAVENOUS
  Administered 2015-05-25 (×2): 6 ug/min via INTRAVENOUS
  Administered 2015-05-27: 5 ug/min via INTRAVENOUS
  Administered 2015-05-27: 4 ug/min via INTRAVENOUS
  Administered 2015-05-29: 2 ug/min via INTRAVENOUS
  Filled 2015-05-22 (×16): qty 4

## 2015-05-22 NOTE — Progress Notes (Signed)
SLP Cancellation Note  Patient Details Name: Alan RipperRobert Rosselli MRN: [id number] DOB: [birth year]   Cancelled treatment:       Reason Eval/Treat Not Completed: Medical issues which prohibited therapy. Pt remains intubated.   Maxcine HamLaura Paiewonsky, M.A. CCC-SLP [phone]  Maxcine Hamaiewonsky, Nelson Julson 05/22/2015, 8:44 AM

## 2015-05-22 NOTE — Progress Notes (Signed)
PULMONARY / CRITICAL CARE MEDICINE   Name: Juan Cameron MRN: [id number] DOB: [birth year]    ADMISSION DATE: 05/16/2015 CONSULTATION DATE: 05/16/2015  REFERRING MD:  Rehab MD  CHIEF COMPLAINT: Respiratory Distress/ Hypoxemia  HISTORY OF PRESENT ILLNESS:   40 y/o M with PMH of sickle cell trait, PNA and autism with recent prolonged hospitalization for multilobar PNA with ARDS, AKI requiring HD (resolved, off HD), prolonged respiratory failure s/p tracheostomy. He was discharged to [facility] on 3/27 for further ventilator weaning. At [facility], he was He subsequently was decannulated and transferred to [facility] on 4/25.Am of 5/5 2017 pt developed progressive dry cough with acute desaturation into the 70's on 4L min [place], after an episode of emesis. ( ? Desaturation) Placed on 100 % mask,  saturations are now 100%, with decreased WOB. Pt. was given 1 dose of Lasix 40 mg IV, no void as of yet.  SUBJECTIVE:  Intubated , sedated, critically ill More agitated this am requiring bolus sedation Increasing FIO2 Febrile overnight   HEMODYNAMICS:  Temp:  [98.4 F (36.9 C)-103.1 F (39.5 C)] 101.4 F (38.6 C) (05/11 0806) Pulse Rate:  [88-145] 111 (05/11 0800) Resp:  [13-26] 18 (05/11 0800) BP: (66-147)/(38-82) 98/58 mmHg (05/11 0800) SpO2:  [72 %-100 %] 87 % (05/11 0800) FiO2 (%):  [40 %-100 %] 80 % (05/11 0800) Weight:  [203 lb 14.8 oz (92.5 kg)] 203 lb 14.8 oz (92.5 kg) (05/11 0400)   Vent Mode:  [-] PRVC FiO2 (%):  [40 %-100 %] 80 % Set Rate:  [18 bmp] 18 bmp Vt Set:  [560 mL] 560 mL PEEP:  [5 cmH20-10 cmH20] 5 cmH20 Plateau Pressure:  [19 cmH20-28 cmH20] 28 cmH20 INTAKE / OUTPUT: I/O last 3 completed shifts: In: 1960.7 [I.V.:810.7; NG/GT:400; IV Piggyback:750] Out: 2385 [Urine:2385]  PHYSICAL EXAMINATION: General No distress,  A&Ox2, developmentally delayed,sedated ENT: /AT, PERRL, no JVD Cardiac: S1, S2, regular rate and rhythm, no murmur Chest: no acc muscles,decreased BS  BL Abd.: Soft, Non-tender, BS + Ext: No clubbing cyanosis, edema or tenderness Neuro:  Non-focal Skin: No rashes, warm and dry, intact Psych: int breakthrough agitation  LABS:  BMET  Recent Labs Lab 05/20/15 0640 05/21/15 0420 05/22/15 0322  NA 139 144 143  K 3.7 4.0 3.8  CL 103 109 109  CO2 23 24 22   BUN 12 16 17   CREATININE 1.55* 1.70* 1.74*  GLUCOSE 109* 93 90   Electrolytes  Recent Labs Lab 05/20/15 0640 05/21/15 0420 05/22/15 0322  CALCIUM 8.5* 8.7* 8.8*  MG  --  1.8  --   PHOS  --  3.2  --    CBC  Recent Labs Lab 05/20/15 0640 05/21/15 0420 05/22/15 0322  WBC 16.2* 15.8* 17.6*  HGB 8.1* 7.4* 7.6*  HCT 24.7* 23.2* 23.8*  PLT 277 297 358   Coag's No results for input(s): APTT, INR in the last 168 hours.  Sepsis Markers  Recent Labs Lab 05/16/15 1832 05/17/15 0455 05/18/15 0400 05/20/15 1457  LATICACIDVEN  --   --   --  1.1  PROCALCITON 1.20 1.67 0.88  --    ABG  Recent Labs Lab 05/19/15 2103 05/21/15 0543  PHART 7.438 7.295*  PCO2ART 33.9* 50.1*  PO2ART 45.4* 73.7*   Liver Enzymes  Recent Labs Lab 05/17/15 0455  AST 21  ALT 19  ALKPHOS 57  BILITOT 0.9  ALBUMIN 2.5*    Cardiac Enzymes No results for input(s): TROPONINI, PROBNP in the last 168 hours.  Glucose  Recent  Labs Lab 05/21/15 0750 05/21/15 1153 05/21/15 1521 05/21/15 1954 05/21/15 2337 05/22/15 0407  GLUCAP 87 78 92 95 89 84    Imaging Dg Chest Port 1 View  05/22/2015  CLINICAL DATA:  Acute respiratory failure EXAM: PORTABLE CHEST 1 VIEW COMPARISON:  05/21/2015 FINDINGS: Cardiac shadow is stable. A right-sided PICC line is in the distal superior vena cava. The overall inspiratory effort is poor. Diffuse bilateral basilar infiltrate/atelectasis are seen. This is increased somewhat in the interval from the prior exam likely related to the poor inspiratory effort. No other focal abnormality is seen. IMPRESSION: Increasing bibasilar changes likely related to  the poor inspiratory effort. Electronically Signed   By: Alcide Clever M.D.   On: 05/22/2015 07:21     STUDIES:  03/10/2015 Echo: EF  60-65%  CULTURES: Sputum - none obtained Blood x 2  5/5 >>NTD Urine  5/5 >>NTD Urine 4/7- Enterococcus R to Vanc, S to Linezolid Blood 5/9>>> Sputum 5/9>>>  ANTIBIOTICS: Vanc 5/3>> 5/6, 5/9 >> Zosyn 5/5>5/9 Ceftaz 5/9>>>  SIGNIFICANT EVENTS: 5/5 > worsening cough which provoked emesis event leading to desaturations and increased WOB. CXR 5/4 with  Interstitial edema and bilateral infiltrates. 5/5 CXR shows continued mild interstitial edema,bilateral basilar atelectasis or infiltrate 5/9 > Worsening O2 demands overnight now up to 100% NRB with HFNC underneath. He is still SOB despite this and tachypneic with rate 39. Transferred to ICU and intubated.  LINES/TUBES: Right PICC 05/04>>  DISCUSSION: 40 year old autistic male who had a prolonged hospitalization requiring tracheostomy who was decannulated then sent to CIR. In CIR the patient developed right sided infiltrate and PCCM was consulted 5/3. Started on vanco / zosyn / levaquin; the levaquin was stopped 5/4. He has bilateral basilar infiltrates vs atx, dry cough. On 5/5 he developed desaturation to 70's, placed on 1.00 mask. He is awake, continues to cough w no sputum. Procalcitonin initially <0.10, but empiric antibiotics started. Acute on chronic hypoxemia with cough, concerning for HCAP vs aspiration pneumonitis/PNA:   ASSESSMENT / PLAN:  PULMONARY A: ARDS- Acute on Chronic hypoxemia with cough concerning for HCAP vs. Pneumonitis/ PNA/ Atelectasis Aspiration Risk/ Respiratory compromise Procalcitonin 1.67, WBC 5/5 16.3> 5/6 11.1 on VancZosyn P:  Titrate down FIO2/ PEEP - worsening likely related to agitation Titrate O2 for sat of 88-92%.   CARDIOVASCULAR A:  Sinus Tachycardia - with low BPs, addressed w fluid boluses. No acute issues P:  Tele  DC lopressor with  BP  RENAL A: Elevated CR - creatinine rising on vanco, positive fluid balance -new baseline 1.1 P:   Trend BMET daily Replete electrolytes as needed Strict I&O will likely need diureses once hemodynamically more stable.  GASTROINTESTINAL A:   Emesis - 5/5, resolved Ileus  P:   Low doseTF. Pepcid Aspiration Precautions.  HEMATOLOGIC A:   VTE Prophylaxis Anemia  Sickle Cell Trait P:  SCD's / Heparin Trend CBC Transfuse for HGB <7  INFECTIOUS A:   Pneumonitis or/ and. Pulmonary edema with new bilateral infiltrates. Potentially aspiration pneumonia.  WBC and Procalcitonin up P:   Ceftaz/vanc. Follow cultures as above & narrow CBC daily Trend WBC/ Fever curve  ENDOCRINE A:   No Acute Issues P:   Monitor serum glucose Add SSI if hyperglycemic  NEUROLOGIC A:   Autism P:   Fent gtt  versed prn- doubt precedex doing much, limited by BP Add clonazepam (home med) & seroquel  FAMILY: sister 5/9, full code for now.  Discussing code status.  - Inter-disciplinary family  meet or Palliative Care meeting due by:  5/12  Summary - ARDS related to aspiration, resolving, hope to extubate eventually Adding seroquel & clonazepam for breakthrough agitation  The patient is critically ill with multiple organ systems failure and requires high complexity decision making for assessment and support, frequent evaluation and titration of therapies, application of advanced monitoring technologies and extensive interpretation of multiple databases. Critical Care Time devoted to patient care services described in this note independent of APP time is 35 minutes.   Cyril Mourningakesh Alva MD. Tonny BollmanFCCP. [place] [facility] Pager [phone] If no response call [phone]    05/22/2015 8:41 AM

## 2015-05-22 NOTE — Progress Notes (Signed)
CSW received phone call from Patient's sister, Annice PihCourtney Lemire, who was very upset that she did not receive a phone call from medical team when Patient became tachypneic and had low blood pressure. She reports that a friend came by to visit Patient earlier and informed sister of Patient's condition. Patient's sister asked CSW to read Patient's current vitals, at which time CSW explained that she was not in Patient's room and that she would need to speak with the nursing staff regarding medical inquiries. Patient's sister requesting that Patient be transferred to another hospital, particularly [facility], [facility], or [facility]. Patient's sister reports that she gets off work at 6:30PM and will be coming to the hospital as soon as she gets off work. She is requesting that attending MD call her as soon as possible regarding Patient's care. CSW staffed with Patient's RN.      Lance MussAshley Gardner,MSW, LCSW [facility] ED/[facility] Clinical Social Worker [phone]

## 2015-05-23 ENCOUNTER — Inpatient Hospital Stay (HOSPITAL_COMMUNITY): Payer: Medicare Other

## 2015-05-23 LAB — CBC
HCT: 22.6 % — ABNORMAL LOW (ref 39.0–52.0)
Hemoglobin: 7.3 g/dL — ABNORMAL LOW (ref 13.0–17.0)
MCH: 27.1 pg (ref 26.0–34.0)
MCHC: 32.3 g/dL (ref 30.0–36.0)
MCV: 84 fL (ref 78.0–100.0)
PLATELETS: 327 10*3/uL (ref 150–400)
RBC: 2.69 MIL/uL — AB (ref 4.22–5.81)
RDW: 15.7 % — ABNORMAL HIGH (ref 11.5–15.5)
WBC: 9.9 10*3/uL (ref 4.0–10.5)

## 2015-05-23 LAB — VANCOMYCIN, TROUGH: VANCOMYCIN TR: 23 ug/mL — AB (ref 10.0–20.0)

## 2015-05-23 LAB — GLUCOSE, CAPILLARY
GLUCOSE-CAPILLARY: 101 mg/dL — AB (ref 65–99)
GLUCOSE-CAPILLARY: 126 mg/dL — AB (ref 65–99)
GLUCOSE-CAPILLARY: 141 mg/dL — AB (ref 65–99)
GLUCOSE-CAPILLARY: 157 mg/dL — AB (ref 65–99)
Glucose-Capillary: 117 mg/dL — ABNORMAL HIGH (ref 65–99)
Glucose-Capillary: 145 mg/dL — ABNORMAL HIGH (ref 65–99)

## 2015-05-23 LAB — BASIC METABOLIC PANEL
Anion gap: 14 (ref 5–15)
BUN: 15 mg/dL (ref 6–20)
CALCIUM: 8.9 mg/dL (ref 8.9–10.3)
CO2: 21 mmol/L — ABNORMAL LOW (ref 22–32)
CREATININE: 1.7 mg/dL — AB (ref 0.61–1.24)
Chloride: 111 mmol/L (ref 101–111)
GFR calc non Af Amer: 49 mL/min — ABNORMAL LOW (ref >60)
GFR, EST AFRICAN AMERICAN: 57 mL/min — AB (ref >60)
Glucose, Bld: 125 mg/dL — ABNORMAL HIGH (ref 65–99)
Potassium: 3.5 mmol/L (ref 3.5–5.1)
SODIUM: 146 mmol/L — AB (ref 135–145)

## 2015-05-23 MED ORDER — DOCUSATE SODIUM 50 MG/5ML PO LIQD
100.0000 mg | Freq: Two times a day (BID) | ORAL | Status: DC
  Administered 2015-05-23 – 2015-05-28 (×11): 100 mg via ORAL
  Filled 2015-05-23 (×15): qty 10

## 2015-05-23 MED ORDER — VANCOMYCIN HCL IN DEXTROSE 1-5 GM/200ML-% IV SOLN
1000.0000 mg | INTRAVENOUS | Status: AC
  Administered 2015-05-23 – 2015-05-28 (×6): 1000 mg via INTRAVENOUS
  Filled 2015-05-23 (×6): qty 200

## 2015-05-23 MED ORDER — PANTOPRAZOLE SODIUM 40 MG PO PACK
40.0000 mg | PACK | Freq: Every day | ORAL | Status: DC
  Administered 2015-05-23 – 2015-05-31 (×9): 40 mg
  Filled 2015-05-23 (×11): qty 20

## 2015-05-23 MED ORDER — LACTULOSE 10 GM/15ML PO SOLN
20.0000 g | Freq: Once | ORAL | Status: AC
  Administered 2015-05-23: 20 g
  Filled 2015-05-23: qty 30

## 2015-05-23 MED ORDER — VITAL AF 1.2 CAL PO LIQD
1000.0000 mL | ORAL | Status: DC
  Administered 2015-05-24 – 2015-05-26 (×3): 1000 mL

## 2015-05-23 NOTE — Progress Notes (Signed)
eLink Physician-Brief Progress Note Patient Name: Juan RipperRobert Cameron DOB: 1975-02-08 MRN: [id number]   Date of Service  05/23/2015  HPI/Events of Note  Trying to decrease sedation.  Concern for fall risk, and self extubation.   eICU Interventions  Will order bedside sitter.     Intervention Category Major Interventions: Other:  [name] 05/23/2015, 5:22 PM

## 2015-05-23 NOTE — Progress Notes (Signed)
Pharmacy Antibiotic Note  Juan Cameron is a 40 y.o. male admitted on 05/16/2015 with PNA.  Patient's antibiotics were narrowed, but he became febrile with leukocytosis and respiratory distress requiring intubation and transfer to the ICU.  His antibiotics were also broadened to vancomycin and ceftazidime, and his respiratory culture now grows Staph.  Plan: - Reduce vanc to 1000mg  IV Q24H - Fortaz 1g IV Q8H - Monitor renal fxn, micro data to narrow abx, vanc trough as needed   Height: 5\' 9"  (175.3 cm) Weight: 207 lb 3.7 oz (94 kg) IBW/kg (Calculated) : 70.7  Temp (24hrs), Avg:98.2 F (36.8 C), Min:95.7 F (35.4 C), Max:99.9 F (37.7 C)   Recent Labs Lab 05/18/15 0400 05/19/15 0552 05/19/15 0553 05/20/15 0640 05/20/15 1457 05/21/15 0420 05/22/15 0322 05/23/15 0613  WBC  --  10.6*  --  16.2*  --  15.8* 17.6* 9.9  CREATININE  --  1.60*  --  1.55*  --  1.70* 1.74* 1.70*  LATICACIDVEN  --   --   --   --  1.1  --   --   --   VANCOTROUGH 57*  --   --   --   --   --   --   --   VANCORANDOM  --   --  24  --   --   --   --   --     Estimated Creatinine Clearance: 66.7 mL/min (by C-G formula based on Cr of 1.7).    Allergies  Allergen Reactions  . Peanuts [Peanut Oil] Anaphylaxis    Antimicrobials this admission: LVQ 5/2 >> 5/3 Zosyn 5/3 >> 5/8 Vanc 5/3 >> 5/6, resumed 5/9 >> (5/16) CTX 5/8 >> 5/9 Fortaz 5/9 >>  Dose adjustments this admission: 5/7 VT = 57 mcg/mL on 1g q8 (SCr 1.62) - held  5/8 VR = 24 mcg/mL (SCr 1.6) (ke = 20 hrs) 5/12 VT = 23 mcg/mL on 1500mg  q24 (SCr 1.7) >> 1g q24  Microbiology results: 5/2 UCx - multiple species 5/5 UCx - negative 5/7 MRSA PCR - positive 5/9 BCx x2 - ngtd 5/9 resp cx - few candida alb, Staph aureus (preliminary)   Juan Cameron D. Laney Potashang, PharmD, BCPS Pager:  [phone] 05/23/2015, 2:50 PM

## 2015-05-23 NOTE — Care Management Note (Signed)
Case Management Note  Patient Details  Name: Juan Cameron MRN: [id number] Date of Birth: 07-Oct-1975  Subjective/Objective:   Patient is from CIR with pna, resp failure on NRB and HFNC, has temp of 102, patient is autistic, he lives with his sister, [name], [place] will cont to follow for dc needs.                  Action/Plan: 05/23/2015 Pt remains intubated on sedation.  Plan is for pt to return to a facility - will reassess post extubation   Expected Discharge Date:                  Expected Discharge Plan:  IP Rehab Facility  In-House Referral:  Clinical Social Work  Discharge planning Services  CM Consult  Post Acute Care Choice:    Choice offered to:     DME Arranged:    DME Agency:     HH Arranged:    HH Agency:     Status of Service:  In process, will continue to follow  Medicare Important Message Given:  Yes Date Medicare IM Given:    Medicare IM give by:    Date Additional Medicare IM Given:    Additional Medicare Important Message give by:     If discussed at Long Length of Stay Meetings, dates discussed:    Additional Comments:  Cherylann ParrClaxton, Kristian Mogg S, RN 05/23/2015, 10:44 AM

## 2015-05-23 NOTE — Progress Notes (Signed)
SLP Cancellation Note  Patient Details Name: Alan RipperRobert Kirschenmann MRN: [id number] DOB: 1975-12-06   Cancelled treatment:       Reason Eval/Treat Not Completed: Medical issues which prohibited therapy. Patient continues to be intubated and sedated per progress notes from today. (5/12).  Angela NevinJohn T. Doral Ventrella, MA, CCC-SLP 05/23/2015 1:38 PM

## 2015-05-23 NOTE — Progress Notes (Signed)
PULMONARY / CRITICAL CARE MEDICINE   Name: Juan Cameron MRN: [id number] DOB: 1975/04/25    ADMISSION DATE: 05/16/2015 CONSULTATION DATE: 05/16/2015  REFERRING MD:  Rehab MD  CHIEF COMPLAINT: Respiratory Distress/ Hypoxemia  HISTORY OF PRESENT ILLNESS:   40 y/o M with PMH of sickle cell trait, PNA and autism with recent prolonged hospitalization for multilobar PNA with ARDS, AKI requiring HD (resolved, off HD), prolonged respiratory failure s/p tracheostomy. He was discharged to [facility] on 3/27 for further ventilator weaning. At [facility], he was He subsequently was decannulated and transferred to [facility] on 4/25.Am of 5/5 2017 pt developed progressive dry cough with acute desaturation into the 70's on 4L min [place], after an episode of emesis. ( ? Desaturation) Placed on 100 % mask,  saturations are now 100%, with decreased WOB. Pt. was given 1 dose of Lasix 40 mg IV, no void as of yet.  SUBJECTIVE:  Intubated , sedated, critically ill Remains agitated this am requiring bolus sedation Down to 40%/+5 defervesced overnight   HEMODYNAMICS:  Temp:  [95.7 F (35.4 C)-99.6 F (37.6 C)] 99.5 F (37.5 C) (05/12 0822) Pulse Rate:  [66-115] 99 (05/12 0822) Resp:  [14-24] 20 (05/12 0822) BP: (64-132)/(46-84) 106/57 mmHg (05/12 0822) SpO2:  [91 %-100 %] 100 % (05/12 0822) FiO2 (%):  [40 %-50 %] 40 % (05/12 0822) Weight:  [207 lb 3.7 oz (94 kg)] 207 lb 3.7 oz (94 kg) (05/12 0500)   Vent Mode:  [-] PRVC FiO2 (%):  [40 %-50 %] 40 % Set Rate:  [18 bmp] 18 bmp Vt Set:  [560 mL] 560 mL PEEP:  [5 cmH20] 5 cmH20 Plateau Pressure:  [30 cmH20-34 cmH20] 34 cmH20 INTAKE / OUTPUT: I/O last 3 completed shifts: In: 4641.3 [I.V.:1881.3; NG/GT:1010; IV Piggyback:1750] Out: 2185 [Urine:2185]  PHYSICAL EXAMINATION: General No distress,  A&Ox2, developmentally delayed,sedated ENT: [place]/AT, PERRL, no JVD Cardiac: S1, S2, regular rate and rhythm, no murmur Chest: no acc muscles,decreased BS BL Abd.:  Soft, Non-tender, BS + Ext: No clubbing cyanosis, edema or tenderness Neuro:  Non-focal Skin: No rashes, warm and dry, intact Psych: int breakthrough agitation  LABS:  BMET  Recent Labs Lab 05/21/15 0420 05/22/15 0322 05/23/15 0613  NA 144 143 146*  K 4.0 3.8 3.5  CL 109 109 111  CO2 24 22 21*  BUN 16 17 15   CREATININE 1.70* 1.74* 1.70*  GLUCOSE 93 90 125*   Electrolytes  Recent Labs Lab 05/21/15 0420 05/22/15 0322 05/23/15 0613  CALCIUM 8.7* 8.8* 8.9  MG 1.8  --   --   PHOS 3.2  --   --    CBC  Recent Labs Lab 05/21/15 0420 05/22/15 0322 05/23/15 0613  WBC 15.8* 17.6* 9.9  HGB 7.4* 7.6* 7.3*  HCT 23.2* 23.8* 22.6*  PLT 297 358 327   Coag's No results for input(s): APTT, INR in the last 168 hours.  Sepsis Markers  Recent Labs Lab 05/16/15 1832 05/17/15 0455 05/18/15 0400 05/20/15 1457  LATICACIDVEN  --   --   --  1.1  PROCALCITON 1.20 1.67 0.88  --    ABG  Recent Labs Lab 05/19/15 2103 05/21/15 0543  PHART 7.438 7.295*  PCO2ART 33.9* 50.1*  PO2ART 45.4* 73.7*   Liver Enzymes  Recent Labs Lab 05/17/15 0455  AST 21  ALT 19  ALKPHOS 57  BILITOT 0.9  ALBUMIN 2.5*    Cardiac Enzymes No results for input(s): TROPONINI, PROBNP in the last 168 hours.  Glucose  Recent  Labs Lab 05/22/15 1126 05/22/15 1544 05/22/15 1958 05/22/15 2353 05/23/15 0327 05/23/15 0812  GLUCAP 97 96 89 126* 117* 141*    Imaging Dg Chest Port 1 View  05/23/2015  CLINICAL DATA:  Hypoxia EXAM: PORTABLE CHEST 1 VIEW COMPARISON:  May 22, 2015 FINDINGS: Endotracheal tube tip is 3.6 cm above carina. Central catheter tip is in the superior vena cava. Nasogastric tube tip and side port are below the diaphragm. No pneumothorax. There is patchy airspace consolidation in both lower lobes. There is a small right effusion. Heart is mildly enlarged with pulmonary venous hypertension. No adenopathy evident. IMPRESSION: Tube and catheter positions as described without  pneumothorax. Evidence of pulmonary venous hypertension. Airspace opacity in the bases is likely due to bout lateral pneumonia, although there may be a degree of edema in these areas. Both entities may exist concurrently. Electronically Signed   By: Bretta Bang III M.D.   On: 05/23/2015 07:10     STUDIES:  03/10/2015 Echo: EF  60-65%  CULTURES: Sputum - none obtained Blood x 2  5/5 >>NTD Urine  5/5 >>NTD Urine 4/7- Enterococcus R to Vanc, S to Linezolid Blood 5/9>>> Sputum 5/9>>> rare staph  ANTIBIOTICS: Vanc 5/3>> 5/6, 5/9 >> Zosyn 5/5>5/9 Ceftaz 5/9>>>  SIGNIFICANT EVENTS: 5/5 > worsening cough which provoked emesis event leading to desaturations and increased WOB. CXR 5/4 with  Interstitial edema and bilateral infiltrates. 5/5 CXR shows continued mild interstitial edema,bilateral basilar atelectasis or infiltrate 5/9 > Worsening O2 demands overnight now up to 100% NRB with HFNC underneath. He is still SOB despite this and tachypneic with rate 39. Transferred to ICU and intubated.  LINES/TUBES: Right PICC 05/04>>  DISCUSSION: 40 year old autistic male who had a prolonged hospitalization requiring tracheostomy who was decannulated then sent to CIR. In CIR the patient developed right sided infiltrate and PCCM was consulted 5/3. Started on vanco / zosyn / levaquin; the levaquin was stopped 5/4. He has bilateral basilar infiltrates vs atx, dry cough. On 5/5 he developed desaturation to 70's, placed on 1.00 mask. He is awake, continues to cough w no sputum. Procalcitonin initially <0.10, but empiric antibiotics started. Acute on chronic hypoxemia with cough, concerning for HCAP vs aspiration pneumonitis/PNA:   ASSESSMENT / PLAN:  PULMONARY A: ARDS- Acute on Chronic hypoxemia with cough concerning for HCAP vs. Pneumonitis/ PNA/ Atelectasis Aspiration Risk/ Respiratory compromise 5/11 FIO2/ PEEP - worsening likely related to agitation P:   Ready for SBTs - limited by  agitation   CARDIOVASCULAR A:  Sinus Tachycardia - with low BPs, addressed w fluid boluses. No acute issues P:  Tele  DC lopressor with BP  RENAL A: Elevated CR - creatinine rising on vanco, positive fluid balance -new baseline 1.1 P:   Trend BMET daily Replete electrolytes as needed Strict I&O will likely need diureses once  Off pressors  GASTROINTESTINAL A:   Emesis - 5/5, resolved Ileus  P:   Increase TF to goal. Pepcid Aspiration Precautions. Bowel regimen -lactulose x 1  HEMATOLOGIC A:   VTE Prophylaxis Anemia  Sickle Cell Trait P:  SCD's / Heparin Trend CBC Transfuse for HGB <7  INFECTIOUS A:   Pneumonitis or/ and. Pulmonary edema with new bilateral infiltrates. Potentially aspiration pneumonia.  WBC and Procalcitonin up P:   Ceftaz/vanc. Follow BAL cultures for staph ID CBC daily Trend WBC/ Fever curve  ENDOCRINE A:   No Acute Issues P:   Monitor serum glucose Add SSI if hyperglycemic  NEUROLOGIC A:   Autism  P:   Fent gtt  versed prn- doubt precedex doing much, limited by BP Added clonazepam (home med) & seroquel  - monitor qTc  FAMILY: sister 5/11, full code    - Inter-disciplinary family meet or Palliative Care meeting due by:  5/12  Summary - ARDS related to aspiration, resolving, hope to extubate eventually Titrating seroquel & clonazepam for breakthrough agitation  The patient is critically ill with multiple organ systems failure and requires high complexity decision making for assessment and support, frequent evaluation and titration of therapies, application of advanced monitoring technologies and extensive interpretation of multiple databases. Critical Care Time devoted to patient care services described in this note independent of APP time is 35 minutes.   Cyril Mourningakesh Alva MD. Tonny BollmanFCCP. [place] [facility] Pager [phone] If no response call [phone]    05/23/2015 9:40 AM

## 2015-05-24 ENCOUNTER — Inpatient Hospital Stay (HOSPITAL_COMMUNITY): Payer: Medicare Other

## 2015-05-24 LAB — BASIC METABOLIC PANEL
Anion gap: 9 (ref 5–15)
BUN: 11 mg/dL (ref 6–20)
CALCIUM: 8.9 mg/dL (ref 8.9–10.3)
CO2: 24 mmol/L (ref 22–32)
CREATININE: 1.58 mg/dL — AB (ref 0.61–1.24)
Chloride: 114 mmol/L — ABNORMAL HIGH (ref 101–111)
GFR calc non Af Amer: 54 mL/min — ABNORMAL LOW (ref >60)
Glucose, Bld: 129 mg/dL — ABNORMAL HIGH (ref 65–99)
Potassium: 3.2 mmol/L — ABNORMAL LOW (ref 3.5–5.1)
SODIUM: 147 mmol/L — AB (ref 135–145)

## 2015-05-24 LAB — CULTURE, RESPIRATORY W GRAM STAIN

## 2015-05-24 LAB — GLUCOSE, CAPILLARY
GLUCOSE-CAPILLARY: 120 mg/dL — AB (ref 65–99)
GLUCOSE-CAPILLARY: 101 mg/dL — AB (ref 65–99)
GLUCOSE-CAPILLARY: 115 mg/dL — AB (ref 65–99)
Glucose-Capillary: 102 mg/dL — ABNORMAL HIGH (ref 65–99)
Glucose-Capillary: 119 mg/dL — ABNORMAL HIGH (ref 65–99)
Glucose-Capillary: 130 mg/dL — ABNORMAL HIGH (ref 65–99)

## 2015-05-24 LAB — CULTURE, RESPIRATORY

## 2015-05-24 LAB — CBC
HCT: 21.8 % — ABNORMAL LOW (ref 39.0–52.0)
Hemoglobin: 7.3 g/dL — ABNORMAL LOW (ref 13.0–17.0)
MCH: 28.1 pg (ref 26.0–34.0)
MCHC: 33.5 g/dL (ref 30.0–36.0)
MCV: 83.8 fL (ref 78.0–100.0)
PLATELETS: 309 10*3/uL (ref 150–400)
RBC: 2.6 MIL/uL — AB (ref 4.22–5.81)
RDW: 15.7 % — AB (ref 11.5–15.5)
WBC: 7.6 10*3/uL (ref 4.0–10.5)

## 2015-05-24 MED ORDER — SODIUM CHLORIDE 0.9% FLUSH
10.0000 mL | Freq: Two times a day (BID) | INTRAVENOUS | Status: DC
  Administered 2015-05-24 (×2): 10 mL
  Administered 2015-05-25: 20 mL
  Administered 2015-05-25: 10 mL
  Administered 2015-05-26: 20 mL
  Administered 2015-05-27 – 2015-05-28 (×3): 10 mL
  Administered 2015-05-28: 30 mL
  Administered 2015-05-29 – 2015-06-03 (×8): 10 mL
  Administered 2015-06-03: 40 mL
  Administered 2015-06-04 – 2015-06-05 (×3): 10 mL
  Administered 2015-06-06: 30 mL
  Administered 2015-06-06: 10 mL

## 2015-05-24 MED ORDER — POTASSIUM CHLORIDE 20 MEQ/15ML (10%) PO SOLN
30.0000 meq | ORAL | Status: AC
  Administered 2015-05-24 (×2): 30 meq
  Filled 2015-05-24 (×2): qty 30

## 2015-05-24 MED ORDER — QUETIAPINE FUMARATE 200 MG PO TABS
200.0000 mg | ORAL_TABLET | Freq: Two times a day (BID) | ORAL | Status: DC
  Administered 2015-05-24 – 2015-05-26 (×4): 200 mg via ORAL
  Filled 2015-05-24 (×5): qty 1

## 2015-05-24 MED ORDER — NITROGLYCERIN IN D5W 200-5 MCG/ML-% IV SOLN
INTRAVENOUS | Status: AC
  Filled 2015-05-24: qty 250

## 2015-05-24 MED ORDER — SODIUM CHLORIDE 0.9% FLUSH
10.0000 mL | INTRAVENOUS | Status: DC | PRN
  Administered 2015-06-04 – 2015-06-10 (×6): 10 mL
  Filled 2015-05-24 (×6): qty 40

## 2015-05-24 NOTE — Progress Notes (Signed)
[facility] FOR [facility] LAB REPLACEMENT ONLY  The patient does apply for the [facility] Protocol based on the criteria listed below:   1. Is GFR >/= 40 ml/min? Yes.    Patient's GFR today is >60 2. Is urine output >/= 0.5 ml/kg/hr for the last 6 hours? Yes.   Patient's UOP is 0.59 ml/kg/hr 3. Is BUN < 60 mg/dL? Yes.    Patient's BUN today is 11 4. Abnormal electrolyte(s):  K - 3.2 5. Ordered repletion with: PER PROTOCOL 6. If a panic level lab has been reported, has the CCM MD in charge been notified? Yes.  .   Physician:  Dr. Lorre Munroeeterding  Juan Cameron 05/24/2015 3:54 AM

## 2015-05-24 NOTE — Progress Notes (Signed)
PULMONARY / CRITICAL CARE MEDICINE   Name: Juan Cameron MRN: [id number] DOB: 24-May-1975    ADMISSION DATE: 05/16/2015 CONSULTATION DATE: 05/16/2015  REFERRING MD:  Rehab MD  CHIEF COMPLAINT: Respiratory Distress/ Hypoxemia  HISTORY OF PRESENT ILLNESS:   40 y/o M with PMH of sickle cell trait, PNA and autism with recent prolonged hospitalization for multilobar PNA with ARDS, AKI requiring HD (resolved, off HD), prolonged respiratory failure s/p tracheostomy. [facility]. At [facility], he was He subsequently was decannulated and [facility].Am of 5/5 2017 pt developed progressive dry cough with acute desaturation into the 70's on 4L min [place], after an episode of emesis. ( ? Desaturation) Placed on 100 % mask,  saturations are now 100%, with decreased WOB. Pt. was given 1 dose of Lasix 40 mg IV, no void as of yet.  SUBJECTIVE:  Intubated , sedated, critically ill Remains agitated intermittently - on lower sedation Down to 40%/+5 afebrile   HEMODYNAMICS:  Temp:  [98.6 F (37 C)-99.9 F (37.7 C)] 99.3 F (37.4 C) (05/13 0900) Pulse Rate:  [77-144] 87 (05/13 0900) Resp:  [17-25] 19 (05/13 0900) BP: (81-111)/(41-75) 96/64 mmHg (05/13 0900) SpO2:  [97 %-100 %] 98 % (05/13 0900) FiO2 (%):  [40 %] 40 % (05/13 0900) Weight:  [208 lb 15.9 oz (94.8 kg)] 208 lb 15.9 oz (94.8 kg) (05/13 0500)   Vent Mode:  [-] PRVC FiO2 (%):  [40 %] 40 % Set Rate:  [16 bmp-18 bmp] 18 bmp Vt Set:  [560 mL] 560 mL PEEP:  [5 cmH20] 5 cmH20 Pressure Support:  [15 cmH20] 15 cmH20 Plateau Pressure:  [30 cmH20-33 cmH20] 31 cmH20 INTAKE / OUTPUT: I/O last 3 completed shifts: In: 3917.9 [I.V.:2377.9; NG/GT:1290; IV Piggyback:250] Out: 2575 [Urine:2575]  PHYSICAL EXAMINATION: General No distress,  A&Ox2, developmentally delayed,sedated ENT: [place]/AT, PERRL, no JVD Cardiac: S1, S2, regular rate and rhythm, no murmur Chest: no acc  muscles,decreased BS BL Abd.: Soft, Non-tender, BS + Ext: No clubbing cyanosis, edema or tenderness Neuro:  Non-focal Skin: No rashes, warm and dry, intact Psych: int breakthrough agitation  LABS:  BMET  Recent Labs Lab 05/22/15 0322 05/23/15 0613 05/24/15 0319  NA 143 146* 147*  K 3.8 3.5 3.2*  CL 109 111 114*  CO2 22 21* 24  BUN 17 15 11   CREATININE 1.74* 1.70* 1.58*  GLUCOSE 90 125* 129*   Electrolytes  Recent Labs Lab 05/21/15 0420 05/22/15 0322 05/23/15 0613 05/24/15 0319  CALCIUM 8.7* 8.8* 8.9 8.9  MG 1.8  --   --   --   PHOS 3.2  --   --   --    CBC  Recent Labs Lab 05/22/15 0322 05/23/15 0613 05/24/15 0319  WBC 17.6* 9.9 7.6  HGB 7.6* 7.3* 7.3*  HCT 23.8* 22.6* 21.8*  PLT 358 327 309   Coag's No results for input(s): APTT, INR in the last 168 hours.  Sepsis Markers  Recent Labs Lab 05/18/15 0400 05/20/15 1457  LATICACIDVEN  --  1.1  PROCALCITON 0.88  --    ABG  Recent Labs Lab 05/19/15 2103 05/21/15 0543  PHART 7.438 7.295*  PCO2ART 33.9* 50.1*  PO2ART 45.4* 73.7*   Liver Enzymes No results for input(s): AST, ALT, ALKPHOS, BILITOT, ALBUMIN in the last 168 hours.  Cardiac Enzymes No results for input(s): TROPONINI, PROBNP in the last 168 hours.  Glucose  Recent Labs Lab 05/23/15 1115 05/23/15 1544 05/23/15  1956 05/23/15 2350 05/24/15 0320 05/24/15 0847  GLUCAP 145* 157* 101* 130* 102* 120*    Imaging No results found.   STUDIES:  03/10/2015 Echo: EF  60-65%  CULTURES: Sputum - none obtained Blood x 2  5/5 >>NTD Urine  5/5 >>NTD Urine 4/7- Enterococcus R to Vanc, S to Linezolid Blood 5/9>>>ng Sputum 5/9>>> rare staph>>  ANTIBIOTICS: Vanc 5/3>> 5/6, 5/9 >> Zosyn 5/5>5/9 Ceftaz 5/9>>>  SIGNIFICANT EVENTS: 5/5 > worsening cough which provoked emesis event leading to desaturations and increased WOB. CXR 5/4 with  Interstitial edema and bilateral infiltrates. 5/5 CXR shows continued mild interstitial  edema,bilateral basilar atelectasis or infiltrate 5/9 > Worsening O2 demands overnight now up to 100% NRB with HFNC underneath. He is still SOB despite this and tachypneic with rate 39. Transferred to ICU and intubated.  LINES/TUBES: Right PICC 05/04>>  DISCUSSION: 40 year old autistic male who had a prolonged hospitalization requiring tracheostomy who was decannulated then sent to CIR. In CIR the patient developed right sided infiltrate and PCCM was consulted 5/3. Started on vanco / zosyn / levaquin; the levaquin was stopped 5/4. He has bilateral basilar infiltrates vs atx, dry cough. On 5/5 he developed desaturation to 70's, placed on 1.00 mask. He is awake, continues to cough w no sputum. Procalcitonin initially <0.10, but empiric antibiotics started. Acute on chronic hypoxemia with cough, concerning for HCAP vs aspiration pneumonitis/PNA:   ASSESSMENT / PLAN:  PULMONARY A: ARDS- Acute on Chronic hypoxemia with cough concerning for HCAP vs. Pneumonitis/ PNA/ Atelectasis Aspiration Risk/ Respiratory compromise 5/11 FIO2/ PEEP - worsening likely related to agitation P:  Ready for SBTs - limited by agitation & pressors   CARDIOVASCULAR A:  Sinus Tachycardia - with low BPs, addressed w fluid boluses. No acute issues P:  Tele  DC lopressor with BP  RENAL A: Elevated CR - creatinine roseon vanco,  Now falling again -new baseline 1.1 Hypokalemia P:   Trend BMET daily Replete electrolytes as needed Strict I&O will likely need diureses once  Off pressors  GASTROINTESTINAL A:   Emesis - 5/5, resolved Ileus  P:    TF to goal. Pepcid Aspiration Precautions. Bowel regimen -lactulose x 1  HEMATOLOGIC A:   VTE Prophylaxis Anemia  Sickle Cell Trait P:  SCD's / Heparin Trend CBC Transfuse for HGB <7  INFECTIOUS A:   Pneumonitis or/ and. Pulmonary edema with new bilateral infiltrates. Potentially aspiration pneumonia.  WBC and Procalcitonin up P:    Ceftaz/vanc. Follow BAL cultures for staph ID CBC daily Trend WBC/ Fever curve  ENDOCRINE A:   No Acute Issues P:   Monitor serum glucose Add SSI if hyperglycemic  NEUROLOGIC A:   Autism P:   Fent gtt  versed prn- doubt precedex doing much, limited by BP 5/12 Added clonazepam (home med) & seroquel  - monitor qTc -this seems to be working  FAMILY: sister daily , full code    - Inter-disciplinary family meet or Palliative Care meeting due by:  5/12  Summary - ARDS related to aspiration, resolving, hope to extubate eventually Titrating seroquel & clonazepam for breakthrough agitation  The patient is critically ill with multiple organ systems failure and requires high complexity decision making for assessment and support, frequent evaluation and titration of therapies, application of advanced monitoring technologies and extensive interpretation of multiple databases. Critical Care Time devoted to patient care services described in this note independent of APP time is 35 minutes.   Cyril Mourning MD. Tonny Bollman. [place] [facility] Pager [phone] If no response call [phone]    05/24/2015 9:43 AM

## 2015-05-24 NOTE — Progress Notes (Signed)
SLP Cancellation Note  Patient Details Name: Alan RipperRobert Millican MRN: [id number] DOB: [birth year]   Cancelled treatment:       Reason Eval/Treat Not Completed: Medical issues which prohibited therapy.  Remains intubated.  SLP services to sign off.    Blenda MountsCouture, Soloman Mckeithan Laurice 05/24/2015, 10:40 AM

## 2015-05-25 ENCOUNTER — Inpatient Hospital Stay (HOSPITAL_COMMUNITY): Payer: Medicare Other

## 2015-05-25 LAB — BASIC METABOLIC PANEL
Anion gap: 9 (ref 5–15)
BUN: 12 mg/dL (ref 6–20)
CALCIUM: 8.6 mg/dL — AB (ref 8.9–10.3)
CO2: 24 mmol/L (ref 22–32)
Chloride: 114 mmol/L — ABNORMAL HIGH (ref 101–111)
Creatinine, Ser: 1.55 mg/dL — ABNORMAL HIGH (ref 0.61–1.24)
GFR, EST NON AFRICAN AMERICAN: 55 mL/min — AB (ref >60)
Glucose, Bld: 116 mg/dL — ABNORMAL HIGH (ref 65–99)
POTASSIUM: 4.2 mmol/L (ref 3.5–5.1)
SODIUM: 147 mmol/L — AB (ref 135–145)

## 2015-05-25 LAB — GLUCOSE, CAPILLARY
GLUCOSE-CAPILLARY: 113 mg/dL — AB (ref 65–99)
GLUCOSE-CAPILLARY: 109 mg/dL — AB (ref 65–99)
GLUCOSE-CAPILLARY: 126 mg/dL — AB (ref 65–99)
Glucose-Capillary: 118 mg/dL — ABNORMAL HIGH (ref 65–99)
Glucose-Capillary: 151 mg/dL — ABNORMAL HIGH (ref 65–99)

## 2015-05-25 LAB — CBC
HCT: 22.5 % — ABNORMAL LOW (ref 39.0–52.0)
HEMOGLOBIN: 7 g/dL — AB (ref 13.0–17.0)
MCH: 26.1 pg (ref 26.0–34.0)
MCHC: 31.1 g/dL (ref 30.0–36.0)
MCV: 84 fL (ref 78.0–100.0)
Platelets: 332 10*3/uL (ref 150–400)
RBC: 2.68 MIL/uL — AB (ref 4.22–5.81)
RDW: 16 % — ABNORMAL HIGH (ref 11.5–15.5)
WBC: 11 10*3/uL — AB (ref 4.0–10.5)

## 2015-05-25 LAB — CULTURE, BLOOD (ROUTINE X 2)
CULTURE: NO GROWTH
Culture: NO GROWTH

## 2015-05-25 LAB — PROCALCITONIN: PROCALCITONIN: 1.98 ng/mL

## 2015-05-25 MED ORDER — CLONAZEPAM 0.5 MG PO TABS
2.0000 mg | ORAL_TABLET | Freq: Two times a day (BID) | ORAL | Status: DC
  Administered 2015-05-25 – 2015-05-26 (×2): 2 mg via ORAL
  Filled 2015-05-25 (×2): qty 4

## 2015-05-25 MED ORDER — WHITE PETROLATUM GEL
Status: DC | PRN
  Administered 2015-05-25: 0.2 via TOPICAL

## 2015-05-25 MED ORDER — WHITE PETROLATUM GEL
Status: AC
  Filled 2015-05-25: qty 1

## 2015-05-25 NOTE — Progress Notes (Signed)
PULMONARY / CRITICAL CARE MEDICINE   Name: Juan Cameron MRN: [id number] DOB: 05/06/1975    ADMISSION DATE: 05/16/2015 CONSULTATION DATE: 05/16/2015  REFERRING MD:  Rehab MD  CHIEF COMPLAINT: Respiratory Distress/ Hypoxemia  HISTORY OF PRESENT ILLNESS:   40 y/o M with PMH of sickle cell trait, PNA and autism with recent prolonged hospitalization for multilobar PNA with ARDS, AKI requiring HD (resolved, off HD), prolonged respiratory failure s/p tracheostomy. He was discharged to [facility] on 3/27 for further ventilator weaning. At [facility], he was He subsequently was decannulated and transferred to [facility] on 4/25.Am of 5/5 2017 pt developed progressive dry cough with acute desaturation into the 70's on 4L min [place], after an episode of emesis. ( ? Desaturation) Placed on 100 % mask,  saturations are now 100%, with decreased WOB. Pt. was given 1 dose of Lasix 40 mg IV, no void as of yet.  SUBJECTIVE:  Intubated , sedated, critically ill Remains agitated intermittently  Back up to 60 %/ +8 with agitation Febrile 101 overnight   HEMODYNAMICS:  Temp:  [98.2 F (36.8 C)-101.8 F (38.8 C)] 100.8 F (38.2 C) (05/14 1130) Pulse Rate:  [80-152] 95 (05/14 1130) Resp:  [18-41] 25 (05/14 1130) BP: (79-156)/(44-110) 99/60 mmHg (05/14 1130) SpO2:  [91 %-100 %] 98 % (05/14 1150) FiO2 (%):  [40 %-80 %] 60 % (05/14 1150) Weight:  [216 lb 11.4 oz (98.3 kg)] 216 lb 11.4 oz (98.3 kg) (05/14 0500)   Vent Mode:  [-] PRVC FiO2 (%):  [40 %-80 %] 60 % Set Rate:  [18 bmp] 18 bmp Vt Set:  [560 mL-570 mL] 570 mL PEEP:  [5 cmH20-8 cmH20] 8 cmH20 Pressure Support:  [15 cmH20] 15 cmH20 Plateau Pressure:  [32 cmH20-40 cmH20] 40 cmH20 INTAKE / OUTPUT: I/O last 3 completed shifts: In: 3724.4 [I.V.:1874.4; NG/GT:1400; IV Piggyback:450] Out: 2460 [Urine:2460]  PHYSICAL EXAMINATION: General No distress,  A&Ox2, developmentally delayed,sedated ENT: [place]/AT, PERRL, no JVD Cardiac: S1, S2, regular rate and  rhythm, no murmur Chest: no acc muscles,decreased BS BL Abd.: Soft, Non-tender, BS + Ext: No clubbing cyanosis, edema or tenderness Neuro:  Non-focal Skin: No rashes, warm and dry, intact Psych: int breakthrough agitation  LABS:  BMET  Recent Labs Lab 05/23/15 0613 05/24/15 0319 05/25/15 0420  NA 146* 147* 147*  K 3.5 3.2* 4.2  CL 111 114* 114*  CO2 21* 24 24  BUN 15 11 12   CREATININE 1.70* 1.58* 1.55*  GLUCOSE 125* 129* 116*   Electrolytes  Recent Labs Lab 05/21/15 0420  05/23/15 0613 05/24/15 0319 05/25/15 0420  CALCIUM 8.7*  < > 8.9 8.9 8.6*  MG 1.8  --   --   --   --   PHOS 3.2  --   --   --   --   < > = values in this interval not displayed. CBC  Recent Labs Lab 05/23/15 0613 05/24/15 0319 05/25/15 0420  WBC 9.9 7.6 11.0*  HGB 7.3* 7.3* 7.0*  HCT 22.6* 21.8* 22.5*  PLT 327 309 332   Coag's No results for input(s): APTT, INR in the last 168 hours.  Sepsis Markers  Recent Labs Lab 05/20/15 1457  LATICACIDVEN 1.1   ABG  Recent Labs Lab 05/19/15 2103 05/21/15 0543  PHART 7.438 7.295*  PCO2ART 33.9* 50.1*  PO2ART 45.4* 73.7*   Liver Enzymes No results for input(s): AST, ALT, ALKPHOS, BILITOT, ALBUMIN in the last 168 hours.  Cardiac Enzymes No results for input(s): TROPONINI, PROBNP in the  last 168 hours.  Glucose  Recent Labs Lab 05/24/15 1154 05/24/15 1651 05/24/15 2006 05/25/15 0014 05/25/15 0421 05/25/15 0759  GLUCAP 101* 115* 119* 118* 113* 151*    Imaging Dg Chest Port 1 View  05/25/2015  CLINICAL DATA:  Acute respiratory failure EXAM: PORTABLE CHEST 1 VIEW COMPARISON:  05/24/2015 FINDINGS: Cardiomegaly again noted. Stable endotracheal and NG tube position. Stable right arm PICC line position. Again noted patchy multifocal bilateral airspace disease and mild interstitial prominence with slight worsening in aeration in right lung. Findings suspicious for worsening pneumonia or pulmonary edema. IMPRESSION: Stable support  apparatus.Again noted patchy multifocal bilateral airspace disease and mild interstitial prominence with slight worsening in aeration in right lung. Findings suspicious for worsening pneumonia or pulmonary edema. Electronically Signed   By: Natasha MeadLiviu  Pop M.D.   On: 05/25/2015 09:01     STUDIES:  03/10/2015 Echo: EF  60-65%  CULTURES: Sputum - none obtained Blood x 2  5/5 >>NTD Urine  5/5 >>NTD Urine 4/7- Enterococcus R to Vanc, S to Linezolid Blood 5/9>>>ng Sputum 5/9>>>MRSA   ANTIBIOTICS: Vanc 5/3>> 5/6, 5/9 >> Zosyn 5/5>5/9 Ceftaz 5/9>>>  SIGNIFICANT EVENTS: 5/5 > worsening cough which provoked emesis event leading to desaturations and increased WOB. CXR 5/4 with  Interstitial edema and bilateral infiltrates. 5/5 CXR shows continued mild interstitial edema,bilateral basilar atelectasis or infiltrate 5/9 > Worsening O2 demands overnight now up to 100% NRB with HFNC underneath. He is still SOB despite this and tachypneic with rate 39. Transferred to ICU and intubated.  LINES/TUBES: Right PICC 05/04>>  DISCUSSION: 40 year old autistic male who had a prolonged hospitalization requiring tracheostomy who was decannulated then sent to CIR. In CIR the patient developed right sided infiltrate and PCCM was consulted 5/3. Started on vanco / zosyn / levaquin; the levaquin was stopped 5/4. He has bilateral basilar infiltrates vs atx, dry cough. On 5/5 he developed desaturation to 70's, placed on 1.00 mask. He is awake, continues to cough w no sputum. Procalcitonin initially <0.10, but empiric antibiotics started. Acute on chronic hypoxemia with cough, concerning for HCAP vs aspiration pneumonitis/PNA:  CXR & oxygenation worse 5/14  ASSESSMENT / PLAN:  PULMONARY A: ARDS- Acute on Chronic hypoxemia with cough concerning for HCAP vs. Aspiration PNA 5/11 FIO2/ PEEP - worsening likely related to agitation CXR & oxygenation worse 5/14 P:   SBTs - limited by agitation & pressors Keep PEEP  8   CARDIOVASCULAR A:  Sinus Tachycardia - with low BPs, addressed w fluid boluses. Septic shock P:  Tele  DC lopressor with BP  RENAL A: Elevated CR - creatinine roseon vanco,  Now falling again -new baseline 1.1 Hypokalemia P:   Trend BMET daily Replete electrolytes as needed Strict I&O will need diureses once  Off pressors  GASTROINTESTINAL A:   Emesis - 5/5, resolved Ileus  P:    TF to goal. Pepcid Aspiration Precautions. Bowel regimen  HEMATOLOGIC A:   VTE Prophylaxis Anemia  Sickle Cell Trait P:  SCD's / Heparin Trend CBC Transfuse for HGB <7  INFECTIOUS A:   MRSA HCAP vs aspiration pneumonia.  WBC and Procalcitonin up P:   Ceftaz/vanc. CBC daily Trend WBC/ Fever curve  ENDOCRINE A:   No Acute Issues P:   Monitor serum glucose Add SSI if hyperglycemic  NEUROLOGIC A:   Autism P:   Fent gtt Precedex  versed prn- doubt precedex doing much, limited by BP 5/12 Titrating  clonazepam (home med) & seroquel  - monitorig qTc -this  seems to be working  FAMILY: sister daily , full code    - Inter-disciplinary family meet or Palliative Care meeting due by:  5/12  Summary - ARDS related to aspiration/ MRSA,  hope to extubate eventuallybutmental status main barrier Titrating seroquel & clonazepam for breakthrough agitation  The patient is critically ill with multiple organ systems failure and requires high complexity decision making for assessment and support, frequent evaluation and titration of therapies, application of advanced monitoring technologies and extensive interpretation of multiple databases. Critical Care Time devoted to patient care services described in this note independent of APP time is 35 minutes.   Cyril Mourning MD. Tonny Bollman. [place] [facility] Pager [phone] If no response call [phone]    05/25/2015 11:59 AM

## 2015-05-26 ENCOUNTER — Inpatient Hospital Stay (HOSPITAL_COMMUNITY): Payer: Medicare Other

## 2015-05-26 DIAGNOSIS — J15212 Pneumonia due to Methicillin resistant Staphylococcus aureus: Secondary | ICD-10-CM

## 2015-05-26 LAB — FERRITIN: Ferritin: 443 ng/mL — ABNORMAL HIGH (ref 24–336)

## 2015-05-26 LAB — BASIC METABOLIC PANEL
Anion gap: 6 (ref 5–15)
BUN: 15 mg/dL (ref 6–20)
CHLORIDE: 111 mmol/L (ref 101–111)
CO2: 26 mmol/L (ref 22–32)
CREATININE: 1.53 mg/dL — AB (ref 0.61–1.24)
Calcium: 8.6 mg/dL — ABNORMAL LOW (ref 8.9–10.3)
GFR calc non Af Amer: 56 mL/min — ABNORMAL LOW (ref >60)
Glucose, Bld: 128 mg/dL — ABNORMAL HIGH (ref 65–99)
Potassium: 4 mmol/L (ref 3.5–5.1)
Sodium: 143 mmol/L (ref 135–145)

## 2015-05-26 LAB — GLUCOSE, CAPILLARY
GLUCOSE-CAPILLARY: 134 mg/dL — AB (ref 65–99)
GLUCOSE-CAPILLARY: 170 mg/dL — AB (ref 65–99)
GLUCOSE-CAPILLARY: 155 mg/dL — AB (ref 65–99)
GLUCOSE-CAPILLARY: 104 mg/dL — AB (ref 65–99)
Glucose-Capillary: 127 mg/dL — ABNORMAL HIGH (ref 65–99)
Glucose-Capillary: 161 mg/dL — ABNORMAL HIGH (ref 65–99)

## 2015-05-26 LAB — CBC
HCT: 21.8 % — ABNORMAL LOW (ref 39.0–52.0)
HEMOGLOBIN: 7.1 g/dL — AB (ref 13.0–17.0)
MCH: 27.4 pg (ref 26.0–34.0)
MCHC: 32.6 g/dL (ref 30.0–36.0)
MCV: 84.2 fL (ref 78.0–100.0)
PLATELETS: 400 10*3/uL (ref 150–400)
RBC: 2.59 MIL/uL — AB (ref 4.22–5.81)
RDW: 16.3 % — ABNORMAL HIGH (ref 11.5–15.5)
WBC: 11.7 10*3/uL — ABNORMAL HIGH (ref 4.0–10.5)

## 2015-05-26 LAB — IRON AND TIBC
IRON: 15 ug/dL — AB (ref 45–182)
Saturation Ratios: 13 % — ABNORMAL LOW (ref 17.9–39.5)
TIBC: 118 ug/dL — ABNORMAL LOW (ref 250–450)
UIBC: 103 ug/dL

## 2015-05-26 LAB — PROCALCITONIN: Procalcitonin: 1.43 ng/mL

## 2015-05-26 MED ORDER — FUROSEMIDE 10 MG/ML IJ SOLN
40.0000 mg | Freq: Once | INTRAMUSCULAR | Status: AC
  Administered 2015-05-26: 40 mg via INTRAVENOUS
  Filled 2015-05-26: qty 4

## 2015-05-26 MED ORDER — ACETAMINOPHEN 160 MG/5ML PO SOLN: 650.0000 mg | Freq: Four times a day (QID) | ORAL | Status: DC | PRN

## 2015-05-26 MED ORDER — CLONAZEPAM 0.5 MG PO TABS
1.0000 mg | ORAL_TABLET | Freq: Two times a day (BID) | ORAL | Status: DC
  Administered 2015-05-26 – 2015-06-03 (×11): 1 mg
  Filled 2015-05-26 (×11): qty 2

## 2015-05-26 MED ORDER — QUETIAPINE FUMARATE 100 MG PO TABS
100.0000 mg | ORAL_TABLET | Freq: Two times a day (BID) | ORAL | Status: DC
  Administered 2015-05-26 – 2015-06-03 (×11): 100 mg
  Filled 2015-05-26 (×18): qty 1

## 2015-05-26 MED ORDER — POLYETHYLENE GLYCOL 3350 17 G PO PACK
17.0000 g | PACK | Freq: Every day | ORAL | Status: DC
  Administered 2015-05-27 – 2015-05-28 (×2): 17 g
  Filled 2015-05-26 (×3): qty 1

## 2015-05-26 MED ORDER — IPRATROPIUM-ALBUTEROL 0.5-2.5 (3) MG/3ML IN SOLN
3.0000 mL | RESPIRATORY_TRACT | Status: DC | PRN
  Filled 2015-05-26: qty 3

## 2015-05-26 NOTE — Progress Notes (Signed)
CRITICAL VALUE ALERT  Critical value received:  Na 116 K 6.2  Date of notification:  05/26/2015  Time of notification:  0513  Critical value read back:Yes.    Nurse who received alert:  Tatiyanna Lashley Thompson  MD notified (1st page):  Marchelle Gearingamaswamy  Time of first page:  [phone]  MD notified (2nd page):  Time of second page:  Responding MD:  Marchelle Gearingamaswamy  Time MD responded:  [phone]

## 2015-05-26 NOTE — Progress Notes (Signed)
PULMONARY / CRITICAL CARE MEDICINE   Name: Juan RipperRobert Kitko MRN: [id number] DOB: 1975/10/01    ADMISSION DATE: 05/16/2015  REFERRING MD:  Rehab MD  CHIEF COMPLAINT: Respiratory Distress/ Hypoxemia  SUBJECTIVE:  Remains on full vent support, sedation.  VITAL SIGNS: BP 98/47 mmHg  Pulse 90  Temp(Src) 97.7 F (36.5 C) (Core (Comment))  Resp 7  Ht 5\' 9"  (1.753 m)  Wt 212 lb 15.4 oz (96.6 kg)  BMI 31.43 kg/m2  SpO2 100%  VENT SETTINGS: Vent Mode:  [-] PRVC FiO2 (%):  [60 %] 60 % Set Rate:  [18 bmp] 18 bmp Vt Set:  [570 mL] 570 mL PEEP:  [8 cmH20] 8 cmH20 Plateau Pressure:  [36 cmH20-42 cmH20] 42 cmH20  INTAKE / OUTPUT: I/O last 3 completed shifts: In: 4879 [I.V.:2559; NG/GT:1720; IV Piggyback:600] Out: 2380 [Urine:2380]  PHYSICAL EXAMINATION: General: sedated Neuro: RASS -3 HEENT: ETT in place Cardiac: regular Chest: b/l crackles Abd: soft, non tender Ext: 1+ edema Skin: no rashes  LABS:  BMET  Recent Labs Lab 05/24/15 0319 05/25/15 0420 05/26/15 0500  NA 147* 147* 143  K 3.2* 4.2 4.0  CL 114* 114* 111  CO2 24 24 26   BUN 11 12 15   CREATININE 1.58* 1.55* 1.53*  GLUCOSE 129* 116* 128*   Electrolytes  Recent Labs Lab 05/21/15 0420  05/24/15 0319 05/25/15 0420 05/26/15 0500  CALCIUM 8.7*  < > 8.9 8.6* 8.6*  MG 1.8  --   --   --   --   PHOS 3.2  --   --   --   --   < > = values in this interval not displayed. CBC  Recent Labs Lab 05/24/15 0319 05/25/15 0420 05/26/15 0500  WBC 7.6 11.0* 11.7*  HGB 7.3* 7.0* 7.1*  HCT 21.8* 22.5* 21.8*  PLT 309 332 400   Coag's No results for input(s): APTT, INR in the last 168 hours.  Sepsis Markers  Recent Labs Lab 05/20/15 1457 05/25/15 1325 05/26/15 0500  LATICACIDVEN 1.1  --   --   PROCALCITON  --  1.98 1.43   ABG  Recent Labs Lab 05/19/15 2103 05/21/15 0543  PHART 7.438 7.295*  PCO2ART 33.9* 50.1*  PO2ART 45.4* 73.7*   Liver Enzymes No results for input(s): AST, ALT, ALKPHOS,  BILITOT, ALBUMIN in the last 168 hours.  Cardiac Enzymes No results for input(s): TROPONINI, PROBNP in the last 168 hours.  Glucose  Recent Labs Lab 05/25/15 0759 05/25/15 1159 05/25/15 1609 05/26/15 0107 05/26/15 0428 05/26/15 0747  GLUCAP 151* 109* 126* 134* 127* 170*    Imaging Dg Chest Port 1 View  05/26/2015  CLINICAL DATA:  Respiratory failure. EXAM: PORTABLE CHEST 1 VIEW COMPARISON:  05/25/2015. FINDINGS: Endotracheal tube, NG tube, right PICC line in stable position. Stable cardiomegaly. Diffuse bilateral pulmonary infiltrates and/or edema again noted, no interim change. No prominent pleural effusion or pneumothorax . Bowel distention noted. Process such as ileus cannot be excluded. Abdominal series can be obtained for further evaluation . IMPRESSION: 1. Lines and tubes in stable position. 2.  Stable cardiomegaly. 3. Diffuse unchanged bilateral pulmonary infiltrates and or edema. 3. Bowel distention noted. A process such as an ileus cannot be excluded. Abdominal series can be obtained for further evaluation. Electronically Signed   By: Maisie Fushomas  Register   On: 05/26/2015 07:14     STUDIES:  03/10/2015 Echo >> EF  60-65%  CULTURES: 5/05 Urine >> negative 5/09 Blood >> negative 5/09 Sputum >> MRSA 5/14 Blood >>  ANTIBIOTICS: 5/02 Levaquin >> 5/03 5/04 Zosyn >> 5/07 5/04 Vancomycin >> 5/07 Rocephin >> 5/09 5/09 Fortaz >>  SIGNIFICANT EVENTS: 5/05 From rehab to [facility] with respiratory failure 5/09 To ICU >> VDRF  LINES/TUBES: 5/04 Right PICC >> 5/09 ETT >>  DISCUSSION: 40 yo male had prolonged hospitalization for PNA with ARDS s/p tracheostomy >> eventually decannulated and transferred to rehab.  He developed progressive hypoxia leading to respiratory failure from MRSA pneumonia.  He has PMHx of autism, and sickle cell trait.  ASSESSMENT / PLAN:  PULMONARY A: Acute hypoxic respiratory failure 2nd to HCAP and acute pulmonary edema. P:  Full vent support Adjust  PEEP/FiO2 to keep SpO2 > 92% F/u CXR Lasix 40 mg IV x one 5/15 >> use pressors to keep BP up  CARDIOVASCULAR A:  Septic shock. P:  Pressors to keep MAP > 65  RENAL A: AKI >> baseline creatinine 1.14 from 05/12/15. P:   Monitor renal fx, urine outpt  GASTROINTESTINAL A:   Nutrition. P:   Tube feeds while on vent Protonix for SUP  HEMATOLOGIC A:   Anemia of critical illness. Hx of sickle cell trait. P:  F/u CBC Check iron studies SQ heparin for DVT prevention  INFECTIOUS A:   MRSA HCAP. Recurrent fever 5/15. P:   Day 12 vancomycin, day 7 fortaz  ENDOCRINE A:   Hyperglycemia. P:   SSI  NEUROLOGIC A:   Acute metabolic encephalopathy. Hx of autism. P:   RASS goal 0 to -1 Change seroquel to 100 mg qhs Change klonopin to 1 mg bid Continue precedex, fentanyl gtt   Updated pt's caregiver at bedside.  CC time 38 minutes.  Coralyn Helling, MD [facility] Pulmonary/Critical Care 05/26/2015, 10:42 AM Pager:  [phone] After 3pm call: [phone]

## 2015-05-26 NOTE — Progress Notes (Signed)
Pharmacy Antibiotic Note  Alan RipperRobert Deren is a 40 y.o. male admitted on 05/16/2015 with PNA.  Patient's antibiotics were narrowed, but he became febrile with leukocytosis and respiratory distress requiring intubation and transfer to the ICU.  His antibiotics were also broadened to vancomycin and ceftazidime, and his respiratory cultures is growing mrsa. Today is day #12 of abx.  Plan: - Reduce vanc to 1000mg  IV Q24H - Fortaz 1g IV Q8H - Monitor renal fxn, drug levels as needed, duration of therapy   Height: 5\' 9"  (175.3 cm) Weight: 212 lb 15.4 oz (96.6 kg) IBW/kg (Calculated) : 70.7  Temp (24hrs), Avg:100 F (37.8 C), Min:97.3 F (36.3 C), Max:101.7 F (38.7 C)   Recent Labs Lab 05/20/15 1457  05/22/15 0322 05/23/15 0613 05/23/15 1400 05/24/15 0319 05/25/15 0420 05/26/15 0500  WBC  --   < > 17.6* 9.9  --  7.6 11.0* 11.7*  CREATININE  --   < > 1.74* 1.70*  --  1.58* 1.55* 1.53*  LATICACIDVEN 1.1  --   --   --   --   --   --   --   VANCOTROUGH  --   --   --   --  23*  --   --   --   < > = values in this interval not displayed.  Estimated Creatinine Clearance: 75.1 mL/min (by C-G formula based on Cr of 1.53).    Allergies  Allergen Reactions  . Peanuts [Peanut Oil] Anaphylaxis    Antimicrobials this admission: LVQ 5/2 >> 5/3 Zosyn 5/3 >> 5/8 Vanc 5/3 >> 5/6, resumed 5/9 >> (5/16) CTX 5/8 >> 5/9 Fortaz 5/9 >>  Dose adjustments this admission: 5/7 VT = 57 mcg/mL on 1g q8 (SCr 1.62) - held  5/8 VR = 24 mcg/mL (SCr 1.6) (ke = 20 hrs) 5/12 VT = 23 mcg/mL on 1500mg  q24 (SCr 1.7) >> 1g q24  Microbiology results: 5/2 UCx - multiple species 5/5 UCx - negative 5/7 MRSA PCR - positive 5/9 BCx x2 - ngtd 5/9 resp cx - few candida alb, mrsa    Agapito GamesAlison Arelyn Gauer, PharmD, BCPS Clinical Pharmacist 05/26/2015 7:49 AM

## 2015-05-27 ENCOUNTER — Inpatient Hospital Stay (HOSPITAL_COMMUNITY): Payer: Medicare Other

## 2015-05-27 LAB — CBC
HEMATOCRIT: 20.7 % — AB (ref 39.0–52.0)
HEMOGLOBIN: 7 g/dL — AB (ref 13.0–17.0)
MCH: 28.2 pg (ref 26.0–34.0)
MCHC: 33.8 g/dL (ref 30.0–36.0)
MCV: 83.5 fL (ref 78.0–100.0)
Platelets: 385 10*3/uL (ref 150–400)
RBC: 2.48 MIL/uL — AB (ref 4.22–5.81)
RDW: 16.2 % — ABNORMAL HIGH (ref 11.5–15.5)
WBC: 10.9 10*3/uL — AB (ref 4.0–10.5)

## 2015-05-27 LAB — BASIC METABOLIC PANEL
ANION GAP: 9 (ref 5–15)
BUN: 16 mg/dL (ref 6–20)
CALCIUM: 8.8 mg/dL — AB (ref 8.9–10.3)
CO2: 28 mmol/L (ref 22–32)
Chloride: 107 mmol/L (ref 101–111)
Creatinine, Ser: 1.33 mg/dL — ABNORMAL HIGH (ref 0.61–1.24)
Glucose, Bld: 120 mg/dL — ABNORMAL HIGH (ref 65–99)
POTASSIUM: 3.6 mmol/L (ref 3.5–5.1)
Sodium: 144 mmol/L (ref 135–145)

## 2015-05-27 LAB — GLUCOSE, CAPILLARY
GLUCOSE-CAPILLARY: 114 mg/dL — AB (ref 65–99)
Glucose-Capillary: 127 mg/dL — ABNORMAL HIGH (ref 65–99)
Glucose-Capillary: 102 mg/dL — ABNORMAL HIGH (ref 65–99)
Glucose-Capillary: 118 mg/dL — ABNORMAL HIGH (ref 65–99)
Glucose-Capillary: 114 mg/dL — ABNORMAL HIGH (ref 65–99)
Glucose-Capillary: 122 mg/dL — ABNORMAL HIGH (ref 65–99)

## 2015-05-27 LAB — PROCALCITONIN: PROCALCITONIN: 0.98 ng/mL

## 2015-05-27 MED ORDER — FERROUS SULFATE 300 (60 FE) MG/5ML PO SYRP
220.0000 mg | ORAL_SOLUTION | Freq: Two times a day (BID) | ORAL | Status: DC
  Filled 2015-05-27: qty 5

## 2015-05-27 MED ORDER — FERROUS SULFATE 220 (44 FE) MG/5ML PO ELIX
220.0000 mg | ORAL_SOLUTION | Freq: Two times a day (BID) | ORAL | Status: DC
  Administered 2015-05-27: 220 mg
  Filled 2015-05-27 (×2): qty 5

## 2015-05-27 MED ORDER — ACETAMINOPHEN 160 MG/5ML PO SOLN
650.0000 mg | Freq: Four times a day (QID) | ORAL | Status: DC | PRN
  Administered 2015-05-28: 650 mg
  Filled 2015-05-27: qty 20.3

## 2015-05-27 MED ORDER — FERROUS SULFATE 300 (60 FE) MG/5ML PO SYRP
300.0000 mg | ORAL_SOLUTION | Freq: Two times a day (BID) | ORAL | Status: DC
  Administered 2015-05-27 – 2015-06-03 (×9): 300 mg via ORAL
  Filled 2015-05-27 (×16): qty 5

## 2015-05-27 MED ORDER — VITAMIN C 500 MG/5ML PO SYRP
500.0000 mg | ORAL_SOLUTION | Freq: Two times a day (BID) | ORAL | Status: DC
  Administered 2015-05-27 – 2015-06-03 (×10): 500 mg
  Filled 2015-05-27 (×17): qty 5

## 2015-05-27 MED ORDER — VITAL AF 1.2 CAL PO LIQD
1000.0000 mL | ORAL | Status: DC
  Administered 2015-05-27 – 2015-05-31 (×7): 1000 mL
  Filled 2015-05-27: qty 1000

## 2015-05-27 MED ORDER — FUROSEMIDE 10 MG/ML IJ SOLN
40.0000 mg | Freq: Once | INTRAMUSCULAR | Status: AC
  Administered 2015-05-27: 40 mg via INTRAVENOUS
  Filled 2015-05-27: qty 4

## 2015-05-27 NOTE — Progress Notes (Signed)
Patient's sister at bedside along with sitter. Sister is insisting that the restraints be loosened. Educated sister that he is still trying to pull the tube out and has been combative tonight all ready. Will continue to monitor. Melina Schoolshompson, Syretta Kochel E, [place]RN 05/27/2015 8:49 PM

## 2015-05-27 NOTE — Progress Notes (Signed)
Nutrition Follow-up  DOCUMENTATION CODES:   Not applicable  INTERVENTION:    Increase Vital AF 1.2 to goal rate of 75 ml/h to provide 2160 kcals, 135 gm protein, 1460 ml free water daily.  NUTRITION DIAGNOSIS:   Inadequate oral intake related to inability to eat as evidenced by NPO status.  Ongoing  GOAL:   Patient will meet greater than or equal to 90% of their needs  Unmet  MONITOR:   Vent status, Labs, Weight trends, TF tolerance, I & O's  ASSESSMENT:   40 y/o M with PMH of sickle cell trait, PNA and autism with recent prolonged hospitalization for multilobar PNA with ARDS, AKI requiring HD (resolved, off HD), prolonged respiratory failure s/p tracheostomy. [facility] for further ventilator weaning. At [facility], he was He subsequently was decann[facility].Am of 5/5 2017 pt developed progressive dry cough with acute desaturation into the 70's on 4L min [place], transferred to SDU.   Required transfer to ICU and intubation on 5/9. Patient is currently receiving Vital AF 1.2 via OGT at 40 ml/h (960 ml/day) to provide 1152 kcals, 72 gm protein, 779 ml free water daily. Tolerating TF well per discussion with RN. Spoke with Dr. Craige CottaSood, okay to advance TF to goal rate. Patient is currently intubated on ventilator support MV: 10.9 L/min Temp (24hrs), Avg:98 F (36.7 C), Min:96.6 F (35.9 C), Max:99.7 F (37.6 C)   Diet Order:  Diet NPO time specified  Skin:  Reviewed, no issues  Last BM:  5/15  Height:   Ht Readings from Last 1 Encounters:  05/20/15 5\' 9"  (1.753 m)    Weight:   Wt Readings from Last 1 Encounters:  05/27/15 212 lb 4.9 oz (96.3 kg)  05/17/15  88.2 kg (BMI=28.7)  Ideal Body Weight:  72.7 kg  BMI:  Body mass index is 31.34 kg/(m^2).  Estimated Nutritional Needs:   Kcal:  2128  Protein:  125-145 gm  Fluid:  2.2 L  EDUCATION NEEDS:   No education needs identified at this  time   Joaquin CourtsKimberly Shaleen Talamantez, RD, LDN, CNSC Pager [phone] After Hours Pager [phone]

## 2015-05-27 NOTE — Progress Notes (Signed)
PULMONARY / CRITICAL CARE MEDICINE   Name: Juan Cameron MRN: [id number] DOB: 22-Apr-1975    ADMISSION DATE: 05/16/2015  REFERRING MD:  Rehab MD  CHIEF COMPLAINT: Respiratory Distress/ Hypoxemia  SUBJECTIVE:  Agitated with WUA.  Respiratory mechanics better.  VITAL SIGNS: BP 117/61 mmHg  Pulse 125  Temp(Src) 99.3 F (37.4 C) (Core (Comment))  Resp 24  Ht  (1.753 m)  Wt 212 lb 4.9 oz (96.3 kg)  BMI 31.34 kg/m2  SpO2 98%  VENT SETTINGS: Vent Mode:  [-] PRVC FiO2 (%):  [40 %-60 %] 50 % Set Rate:  [18 bmp] 18 bmp Vt Set:  [570 mL] 570 mL PEEP:  [5 cmH20] 5 cmH20 Plateau Pressure:  [31 cmH20-38 cmH20] 35 cmH20  INTAKE / OUTPUT: I/O last 3 completed shifts: In: 4911.7 [I.V.:2701.7; NG/GT:1810; IV Piggyback:400] Out: 3295 [Urine:3295]  PHYSICAL EXAMINATION: General: sedated Neuro: RASS -3 HEENT: ETT in place Cardiac: regular Chest: faint b/l crackles Abd: soft, non tender Ext: 1+ edema Skin: no rashes  LABS:  BMET  Recent Labs Lab 05/25/15 0420 05/26/15 0500 05/27/15 0320  NA 147* 143 144  K 4.2 4.0 3.6  CL 114* 111 107  CO2 BUN CREATININE 1.55* 1.53* 1.33*  GLUCOSE 116* 128* 120*   Electrolytes  Recent Labs Lab 05/21/15 0420  05/25/15 0420 05/26/15 0500 05/27/15 0320  CALCIUM 8.7*  < > 8.6* 8.6* 8.8*  MG 1.8  --   --   --   --   PHOS 3.2  --   --   --   --   < > = values in this interval not displayed. CBC  Recent Labs Lab 05/25/15 0420 05/26/15 0500 05/27/15 0320  WBC 11.0* 11.7* 10.9*  HGB 7.0* 7.1* 7.0*  HCT 22.5* 21.8* 20.7*  PLT 332 400 385   Coag's No results for input(s): APTT, INR in the last 168 hours.  Sepsis Markers  Recent Labs Lab 05/20/15 1457 05/25/15 1325 05/26/15 0500 05/27/15 0320  LATICACIDVEN 1.1  --   --   --   PROCALCITON  --  1.98 1.43 0.98   ABG  Recent Labs Lab 05/21/15 0543  PHART 7.295*  PCO2ART 50.1*  PO2ART 73.7*   Liver Enzymes No results for input(s): AST,  ALT, ALKPHOS, BILITOT, ALBUMIN in the last 168 hours.  Cardiac Enzymes No results for input(s): TROPONINI, PROBNP in the last 168 hours.  Glucose  Recent Labs Lab 05/26/15 0747 05/26/15 1151 05/26/15 1617 05/26/15 2144 05/26/15 2329 05/27/15 0327  GLUCAP 170* 161* 155* 104* 127* 102*    Imaging Dg Chest Port 1 View  05/27/2015  CLINICAL DATA:  Respiratory failure. EXAM: PORTABLE CHEST 1 VIEW COMPARISON:  05/26/2015. FINDINGS: Endotracheal tube, NG tube, right PICC line stable position. Heart size stable. Diffuse bilateral pulmonary infiltrates and or edema again noted and unchanged. No pleural effusion or pneumothorax. IMPRESSION: 1. Lines and tubes in stable position. 2.  Stable cardiomegaly. 3. Diffuse bilateral pulmonary infiltrates and or edema again noted. No interim change. Electronically Signed   By: Maisie Fus  Register   On: 05/27/2015 07:20     STUDIES:  03/10/2015 Echo >> EF  60-65%  CULTURES: 5/05 Urine >> negative 5/09 Blood >> negative 5/09 Sputum >> MRSA 5/14 Blood >>  ANTIBIOTICS: 5/02 Levaquin >> 5/03 5/04 Zosyn >> 5/07 5/04 Vancomycin >> 5/07 Rocephin >> 5/09 5/09 Fortaz >> 5/16  SIGNIFICANT EVENTS: 5/05 From rehab to [facility] with respiratory failure 5/09 To ICU >>  VDRF  LINES/TUBES: 5/04 Right PICC >> 5/09 ETT >>  DISCUSSION: 40 yo male had prolonged hospitalization for PNA with ARDS s/p tracheostomy >> eventually decannulated and transferred to rehab.  He developed progressive hypoxia leading to respiratory failure from MRSA pneumonia.  He has PMHx of autism, and sickle cell trait.  ASSESSMENT / PLAN:  PULMONARY A: Acute hypoxic respiratory failure 2nd to HCAP and acute pulmonary edema. P:  Pressure support wean as tolerated Adjust PEEP/FiO2 to keep SpO2 > 92% F/u CXR Lasix 40 mg IV x one 5/16 >> use pressors to keep BP up  CARDIOVASCULAR A:  Septic shock. P:  Pressors to keep MAP > 65  RENAL A: AKI >> baseline creatinine 1.14 from  05/12/15. P:   Monitor renal fx, urine outpt  GASTROINTESTINAL A:   Nutrition. P:   Tube feeds while on vent Protonix for SUP  HEMATOLOGIC A:   Anemia of critical illness, iron deficiency. Hx of sickle cell trait. P:  F/u CBC Add feosol and vitamin C 5/16 SQ heparin for DVT prevention  INFECTIOUS A:   MRSA HCAP. Recurrent fever 5/15. P:   Day 13/14 vancomycin D/c  fortaz 5/16  ENDOCRINE A:   Hyperglycemia. P:   SSI  NEUROLOGIC A:   Acute metabolic encephalopathy. Hx of autism. P:   RASS goal 0 to -1 Changed seroquel to 100 mg qhs on 5/15 Changed klonopin to 1 mg bid on 5/15  Continue precedex, fentanyl gtt  CC time 32 minutes.  Coralyn HellingVineet Payden Docter, MD [facility] Pulmonary/Critical Care 05/27/2015, 9:13 AM Pager:  [phone] After 3pm call: [phone]

## 2015-05-28 ENCOUNTER — Inpatient Hospital Stay (HOSPITAL_COMMUNITY): Payer: Medicare Other

## 2015-05-28 LAB — CBC
HCT: 21.1 % — ABNORMAL LOW (ref 39.0–52.0)
Hemoglobin: 7.1 g/dL — ABNORMAL LOW (ref 13.0–17.0)
MCH: 28.4 pg (ref 26.0–34.0)
MCHC: 33.6 g/dL (ref 30.0–36.0)
MCV: 84.4 fL (ref 78.0–100.0)
PLATELETS: 493 10*3/uL — AB (ref 150–400)
RBC: 2.5 MIL/uL — ABNORMAL LOW (ref 4.22–5.81)
RDW: 16.3 % — AB (ref 11.5–15.5)
WBC: 11.4 10*3/uL — ABNORMAL HIGH (ref 4.0–10.5)

## 2015-05-28 LAB — GLUCOSE, CAPILLARY
GLUCOSE-CAPILLARY: 117 mg/dL — AB (ref 65–99)
GLUCOSE-CAPILLARY: 119 mg/dL — AB (ref 65–99)
GLUCOSE-CAPILLARY: 130 mg/dL — AB (ref 65–99)
GLUCOSE-CAPILLARY: 131 mg/dL — AB (ref 65–99)
Glucose-Capillary: 130 mg/dL — ABNORMAL HIGH (ref 65–99)

## 2015-05-28 LAB — BASIC METABOLIC PANEL
ANION GAP: 10 (ref 5–15)
BUN: 16 mg/dL (ref 6–20)
CALCIUM: 8.6 mg/dL — AB (ref 8.9–10.3)
CHLORIDE: 107 mmol/L (ref 101–111)
CO2: 28 mmol/L (ref 22–32)
CREATININE: 1.27 mg/dL — AB (ref 0.61–1.24)
GFR calc non Af Amer: >60 mL/min (ref >60)
Glucose, Bld: 121 mg/dL — ABNORMAL HIGH (ref 65–99)
Potassium: 3.4 mmol/L — ABNORMAL LOW (ref 3.5–5.1)
SODIUM: 145 mmol/L (ref 135–145)

## 2015-05-28 MED ORDER — POLYETHYLENE GLYCOL 3350 17 G PO PACK
17.0000 g | PACK | Freq: Every day | ORAL | Status: DC | PRN
  Filled 2015-05-28: qty 1

## 2015-05-28 MED ORDER — MIDAZOLAM BOLUS VIA INFUSION
1.0000 mg | INTRAVENOUS | Status: DC | PRN
  Administered 2015-05-28: 2 mg via INTRAVENOUS
  Filled 2015-05-28 (×2): qty 4

## 2015-05-28 MED ORDER — MIDAZOLAM HCL 2 MG/2ML IJ SOLN
2.0000 mg | INTRAMUSCULAR | Status: DC | PRN
  Administered 2015-05-28 – 2015-05-31 (×12): 2 mg via INTRAVENOUS
  Filled 2015-05-28 (×12): qty 2

## 2015-05-28 MED ORDER — FUROSEMIDE 10 MG/ML IJ SOLN
40.0000 mg | Freq: Four times a day (QID) | INTRAMUSCULAR | Status: AC
  Administered 2015-05-28 (×2): 40 mg via INTRAVENOUS
  Filled 2015-05-28 (×3): qty 4

## 2015-05-28 MED ORDER — MIDAZOLAM HCL 5 MG/ML IJ SOLN
1.0000 mg/h | INTRAMUSCULAR | Status: DC
  Administered 2015-05-28: 1 mg/h via INTRAVENOUS
  Filled 2015-05-28: qty 10

## 2015-05-28 MED ORDER — HALOPERIDOL LACTATE 5 MG/ML IJ SOLN
5.0000 mg | INTRAMUSCULAR | Status: AC
  Administered 2015-05-28: 5 mg via INTRAVENOUS
  Filled 2015-05-28: qty 1

## 2015-05-28 MED ORDER — POTASSIUM CHLORIDE 20 MEQ/15ML (10%) PO SOLN
40.0000 meq | Freq: Once | ORAL | Status: AC
  Administered 2015-05-28: 40 meq via ORAL
  Filled 2015-05-28: qty 30

## 2015-05-28 MED ORDER — POTASSIUM CHLORIDE 20 MEQ/15ML (10%) PO SOLN
20.0000 meq | ORAL | Status: AC
  Administered 2015-05-28 (×2): 20 meq
  Filled 2015-05-28 (×3): qty 15

## 2015-05-28 MED ORDER — MIDAZOLAM HCL 5 MG/ML IJ SOLN: 1.0000 mg | INTRAMUSCULAR | Status: DC | PRN

## 2015-05-28 NOTE — Progress Notes (Signed)
PULMONARY / CRITICAL CARE MEDICINE   Name: Juan RipperRobert Cameron MRN: [id number] DOB: [birth year]    ADMISSION DATE: 05/16/2015  REFERRING MD:  Rehab MD  CHIEF COMPLAINT: Respiratory Distress/ Hypoxemia  SUBJECTIVE:  Remains on full vent support.  VITAL SIGNS: BP 122/80 mmHg  Pulse 99  Temp(Src) 99.9 F (37.7 C) (Oral)  Resp 20  Ht 5\' 9"  (1.753 m)  Wt 218 lb 11.1 oz (99.2 kg)  BMI 32.28 kg/m2  SpO2 97%  VENT SETTINGS: Vent Mode:  [-] PRVC FiO2 (%):  [50 %] 50 % Set Rate:  [18 bmp] 18 bmp Vt Set:  [570 mL] 570 mL PEEP:  [5 cmH20] 5 cmH20 Plateau Pressure:  [26 cmH20-42 cmH20] 38 cmH20  INTAKE / OUTPUT: I/O last 3 completed shifts: In: 5129.4 [I.V.:2545.6; NG/GT:2233.8; IV Piggyback:350] Out: 3581 [Urine:3580; Stool:1]  PHYSICAL EXAMINATION: General: sedated Neuro: RASS -3 HEENT: ETT in place Cardiac: regular Chest: faint b/l crackles Abd: soft, non tender Ext: 1+ edema Skin: no rashes  LABS:  BMET  Recent Labs Lab 05/26/15 0500 05/27/15 0320 05/28/15 0427  NA 143 144 145  K 4.0 3.6 3.4*  CL 111 107 107  CO2 26 28 28   BUN 15 16 16   CREATININE 1.53* 1.33* 1.27*  GLUCOSE 128* 120* 121*   Electrolytes  Recent Labs Lab 05/26/15 0500 05/27/15 0320 05/28/15 0427  CALCIUM 8.6* 8.8* 8.6*   CBC  Recent Labs Lab 05/26/15 0500 05/27/15 0320 05/28/15 0427  WBC 11.7* 10.9* 11.4*  HGB 7.1* 7.0* 7.1*  HCT 21.8* 20.7* 21.1*  PLT 400 385 493*   Coag's No results for input(s): APTT, INR in the last 168 hours.  Sepsis Markers  Recent Labs Lab 05/25/15 1325 05/26/15 0500 05/27/15 0320  PROCALCITON 1.98 1.43 0.98   ABG No results for input(s): PHART, PCO2ART, PO2ART in the last 168 hours. Liver Enzymes No results for input(s): AST, ALT, ALKPHOS, BILITOT, ALBUMIN in the last 168 hours.  Cardiac Enzymes No results for input(s): TROPONINI, PROBNP in the last 168 hours.  Glucose  Recent Labs Lab 05/27/15 1148 05/27/15 1604 05/27/15 1955  05/28/15 0011 05/28/15 0427 05/28/15 0751  GLUCAP 118* 114* 122* 117* 119* 130*    Imaging Dg Chest Port 1 View  05/28/2015  CLINICAL DATA:  Respiratory failure, healthcare associated pneumonia, chronic CHF, acute renal failure. EXAM: PORTABLE CHEST 1 VIEW COMPARISON:  Portable chest x-ray dated May 27, 2015. FINDINGS: The lungs remain mildly hypoinflated. Confluent interstitial and alveolar opacities are present and slightly more conspicuous today. No significant pleural effusion is observed and there is no pneumothorax. The cardiac silhouette is enlarged. The pulmonary vascularity is engorged. The endotracheal tube tip lies 3 cm above the carina. The esophagogastric tube tip projects below the inferior margin of the image. The right-sided PICC line tip projects over the midportion of the SVC. IMPRESSION: Slight interval deterioration in the appearance of the pulmonary interstitium consistent with pulmonary edema or bilateral pneumonia. The support tubes are in stable position. Electronically Signed   By: David  SwazilandJordan M.D.   On: 05/28/2015 07:48     STUDIES:  03/10/2015 Echo >> EF  60-65%  CULTURES: 5/05 Urine >> negative 5/09 Blood >> negative 5/09 Sputum >> MRSA 5/14 Blood >>  ANTIBIOTICS: 5/02 Levaquin >> 5/03 5/04 Zosyn >> 5/07 5/04 Vancomycin >> 5/17 5/07 Rocephin >> 5/09 5/09 Fortaz >> 5/16  SIGNIFICANT EVENTS: 5/05 [facility] 5/09 To ICU >> VDRF  LINES/TUBES: 5/04 Right PICC >> 5/09 ETT >>  DISCUSSION: 40 yo male had prolonged hospitalization for PNA with ARDS s/p tracheostomy >> eventually decannulated and transferred to rehab.  He developed progressive hypoxia leading to respiratory failure from MRSA pneumonia.  He has PMHx of autism, and sickle cell trait.  ASSESSMENT / PLAN:  PULMONARY A: Acute hypoxic respiratory failure 2nd to HCAP and acute pulmonary edema. P:  Pressure support wean as tolerated Adjust PEEP/FiO2 to keep  SpO2 > 92% F/u CXR Lasix 40 mg IV x 2 on 5/17 >> use pressors to keep BP up Might need trach again to assist with vent weaning  CARDIOVASCULAR A:  Septic shock. P:  Pressors to keep MAP > 65  RENAL A: AKI >> baseline creatinine 1.14 from 05/12/15. P:   Monitor renal fx, urine outpt  GASTROINTESTINAL A:   Nutrition. P:   Tube feeds while on vent Protonix for SUP  HEMATOLOGIC A:   Anemia of critical illness, iron deficiency. Hx of sickle cell trait. P:  F/u CBC Added feosol and vitamin C 5/16 SQ heparin for DVT prevention  INFECTIOUS A:   MRSA HCAP. Recurrent fever 5/15. P:   Day 14/14 vancomycin >> d/c after dose on 5/17  ENDOCRINE A:   Hyperglycemia. P:   SSI  NEUROLOGIC A:   Acute metabolic encephalopathy. Hx of autism. P:   RASS goal 0 to -1 Changed seroquel to 100 mg qhs on 5/15 Changed klonopin to 1 mg bid on 5/15  Continue precedex, fentanyl gtt  CC time 31 minutes.  Coralyn Helling, MD [facility] Pulmonary/Critical Care 05/28/2015, 9:47 AM Pager:  [phone] After 3pm call: [phone]

## 2015-05-28 NOTE — Progress Notes (Signed)
[facility] FOR AM LAB REPLACEMENT ONLY  The patient does [facility] based on the criteria listed below:   1. Is GFR >/= 40 ml/min? Yes.    Patient's GFR today is >60 2. Is urine output >/= 0.5 ml/kg/hr for the last 6 hours? Yes.   Patient's UOP is 1.10 ml/kg/hr 3. Is BUN < 60 mg/dL? Yes.    Patient's BUN today is 16 4. Abnormal electrolyte  K 3.4 5. Ordered repletion with: per protocol 6. If a panic level lab has been reported, has the CCM MD in charge been notified? Yes.  .   Physician:  Sharol Rousselramaswamy  Analyn Matusek McEachran 05/28/2015 5:40 AM

## 2015-05-28 NOTE — Progress Notes (Signed)
EPIC downtime lost vital signs from 0030 to 0200. Suzy Bouchardhompson, Markail Diekman E, RN 05/28/2015 3:25 AM

## 2015-05-28 NOTE — Progress Notes (Signed)
Called Elink regarding combative behavior from patient. Versed and fentanyl given every chance they are available. Have rapidly had to advance fentanyl drip to now maxed out per Elink recommendation. Haldol one time dose ordred.Will continue to monitor. Melina Schoolshompson, Debbie Yearick E, [place] 05/28/2015 3:37 AM

## 2015-05-28 NOTE — Progress Notes (Signed)
eLink Physician-Brief Progress Note Patient Name: Juan RipperRobert Cameron DOB: March 11, 1975 MRN: [id number]   Date of Service  05/28/2015  HPI/Events of Note  agiataed  eICU Interventions  Haldol x 1 x 5mg      Intervention Category Major Interventions: Delirium, psychosis, severe agitation - evaluation and management  [name] 05/28/2015, 3:41 AM

## 2015-05-29 ENCOUNTER — Inpatient Hospital Stay (HOSPITAL_COMMUNITY): Payer: Medicare Other

## 2015-05-29 LAB — GLUCOSE, CAPILLARY
GLUCOSE-CAPILLARY: 119 mg/dL — AB (ref 65–99)
GLUCOSE-CAPILLARY: 131 mg/dL — AB (ref 65–99)
GLUCOSE-CAPILLARY: 237 mg/dL — AB (ref 65–99)
GLUCOSE-CAPILLARY: 299 mg/dL — AB (ref 65–99)
Glucose-Capillary: 118 mg/dL — ABNORMAL HIGH (ref 65–99)
Glucose-Capillary: 129 mg/dL — ABNORMAL HIGH (ref 65–99)
Glucose-Capillary: 144 mg/dL — ABNORMAL HIGH (ref 65–99)
Glucose-Capillary: 161 mg/dL — ABNORMAL HIGH (ref 65–99)

## 2015-05-29 LAB — CBC
HEMATOCRIT: 21.1 % — AB (ref 39.0–52.0)
HEMOGLOBIN: 6.5 g/dL — AB (ref 13.0–17.0)
MCH: 26.3 pg (ref 26.0–34.0)
MCHC: 30.8 g/dL (ref 30.0–36.0)
MCV: 85.4 fL (ref 78.0–100.0)
Platelets: 535 10*3/uL — ABNORMAL HIGH (ref 150–400)
RBC: 2.47 MIL/uL — ABNORMAL LOW (ref 4.22–5.81)
RDW: 16.5 % — ABNORMAL HIGH (ref 11.5–15.5)
WBC: 14.9 10*3/uL — ABNORMAL HIGH (ref 4.0–10.5)

## 2015-05-29 LAB — BASIC METABOLIC PANEL
ANION GAP: 11 (ref 5–15)
BUN: 17 mg/dL (ref 6–20)
CO2: 29 mmol/L (ref 22–32)
Calcium: 8.8 mg/dL — ABNORMAL LOW (ref 8.9–10.3)
Chloride: 105 mmol/L (ref 101–111)
Creatinine, Ser: 1.47 mg/dL — ABNORMAL HIGH (ref 0.61–1.24)
GFR calc Af Amer: >60 mL/min (ref >60)
GFR, EST NON AFRICAN AMERICAN: 59 mL/min — AB (ref >60)
GLUCOSE: 133 mg/dL — AB (ref 65–99)
POTASSIUM: 4.1 mmol/L (ref 3.5–5.1)
Sodium: 145 mmol/L (ref 135–145)

## 2015-05-29 LAB — TRIGLYCERIDES: TRIGLYCERIDES: 231 mg/dL — AB (ref <150)

## 2015-05-29 LAB — PREPARE RBC (CROSSMATCH)

## 2015-05-29 LAB — ABO/RH: ABO/RH(D): A POS

## 2015-05-29 LAB — HEMOGLOBIN AND HEMATOCRIT, BLOOD
HEMATOCRIT: 21.1 % — AB (ref 39.0–52.0)
Hemoglobin: 7.2 g/dL — ABNORMAL LOW (ref 13.0–17.0)

## 2015-05-29 LAB — SEDIMENTATION RATE: Sed Rate: 120 mm/hr — ABNORMAL HIGH (ref 0–16)

## 2015-05-29 MED ORDER — FENTANYL BOLUS VIA INFUSION
50.0000 ug | INTRAVENOUS | Status: DC | PRN
  Administered 2015-05-29 (×2): 50 ug via INTRAVENOUS
  Filled 2015-05-29: qty 50

## 2015-05-29 MED ORDER — SODIUM CHLORIDE 0.9 % IV SOLN
25.0000 ug/h | INTRAVENOUS | Status: DC
  Administered 2015-05-29 – 2015-05-31 (×6): 400 ug/h via INTRAVENOUS
  Filled 2015-05-29 (×8): qty 50

## 2015-05-29 MED ORDER — SODIUM CHLORIDE 0.9 % IV SOLN
Freq: Once | INTRAVENOUS | Status: AC
  Administered 2015-05-29: 06:00:00 via INTRAVENOUS

## 2015-05-29 MED ORDER — PROPOFOL 1000 MG/100ML IV EMUL
0.0000 ug/kg/min | INTRAVENOUS | Status: DC
Start: 2015-05-29 — End: 2015-05-31
  Administered 2015-05-29: 5 ug/kg/min via INTRAVENOUS
  Administered 2015-05-29 – 2015-05-30 (×4): 35 ug/kg/min via INTRAVENOUS
  Administered 2015-05-30: 15 ug/kg/min via INTRAVENOUS
  Administered 2015-05-30: 20 ug/kg/min via INTRAVENOUS
  Administered 2015-05-31: 30 ug/kg/min via INTRAVENOUS
  Filled 2015-05-29 (×9): qty 100

## 2015-05-29 NOTE — Care Management Note (Signed)
Case Management Note  Patient Details  Name: Alan RipperRobert Bjorklund MRN: [id number] Date of Birth: 08/19/1975  Subjective/Objective:   Patient is from CIR with pna, resp failure on NRB and HFNC, has temp of 102, patient is autistic, he lives with his sister, [name], [place] will cont to follow for dc needs.                  Action/Plan: 05/29/2015 Pt remains intubated on sedation.  Plan is for pt to return to a facility - will reassess post extubation   Expected Discharge Date:                  Expected Discharge Plan:  IP Rehab Facility  In-House Referral:  Clinical Social Work  Discharge planning Services  CM Consult  Post Acute Care Choice:    Choice offered to:     DME Arranged:    DME Agency:     HH Arranged:    HH Agency:     Status of Service:  In process, will continue to follow  Medicare Important Message Given:  Yes Date Medicare IM Given:    Medicare IM give by:    Date Additional Medicare IM Given:    Additional Medicare Important Message give by:     If discussed at Long Length of Stay Meetings, dates discussed:    Additional Comments: 05/29/2015 Pt remains on ventilator with continuous sedation and pressors, tube feeds and IV antibiotics.   Per attending - tenative plan for trach to assist with weaning.  CM contacted [facility] to determine if pt could possibly discharge back to [facility] when medically clear - liaison will follow back up with CM Cherylann ParrClaxton, Mirielle Byrum S, RN 05/29/2015, 8:34 AM

## 2015-05-29 NOTE — Progress Notes (Signed)
eLink Physician-Brief Progress Note Patient Name: Juan RipperRobert Cameron DOB: [birth year] MRN: [id number]   Date of Service  05/29/2015  HPI/Events of Note  Hgb=6.5  eICU Interventions  Transfuse 1 unit prbc     Intervention Category Intermediate Interventions: Other:  Erin FullingKurian Curlee Bogan 05/29/2015, 3:39 AM

## 2015-05-29 NOTE — Progress Notes (Signed)
CRITICAL VALUE ALERT  Critical value received:  hgb 6.5  Date of notification:  05/29/2015  Time of notification:  0335  Critical value read back:Yes.    Nurse who received alert:  Lillia CorporalHancock, Jahniah Pallas Elizabeth  MD notified (1st page):  Dr. Belia HemanKasa Md  Time of first page:  0337  MD notified (2nd page):  Time of second page:  Responding MD:  Dr. Belia HemanKasa  Time MD responded:  [phone]

## 2015-05-29 NOTE — Progress Notes (Signed)
Wasted 30cc versed with Sterling BigMolly DiMola RN

## 2015-05-29 NOTE — Progress Notes (Signed)
PULMONARY / CRITICAL CARE MEDICINE   Name: Juan Cameron MRN: [id number] DOB: [birth year]    ADMISSION DATE: 05/16/2015  REFERRING MD:  Rehab MD  CHIEF COMPLAINT: Respiratory Distress/ Hypoxemia  SUBJECTIVE:  Remains agitated with WUA.  Now having diarrhea.  VITAL SIGNS: BP 147/96 mmHg  Pulse 140  Temp(Src) 98.2 F (36.8 C) (Core (Comment))  Resp 25  Ht 5' 9"  (1.753 m)  Wt 198 lb 6.6 oz (90 kg)  BMI 29.29 kg/m2  SpO2 96%  VENT SETTINGS: Vent Mode:  [-] PRVC FiO2 (%):  [50 %] 50 % Set Rate:  [18 bmp] 18 bmp Vt Set:  [570 mL] 570 mL PEEP:  [5 cmH20] 5 cmH20 Plateau Pressure:  [28 VXY80-16 cmH20] 28 cmH20  INTAKE / OUTPUT: I/O last 3 completed shifts: In: 6476.6 [I.V.:3056.6; Blood:310; NG/GT:2910; IV Piggyback:200] Out: 5537 [Urine:4205]  PHYSICAL EXAMINATION: General: sedated Neuro: RASS -3 HEENT: ETT in place Cardiac: regular Chest: faint b/l crackles Abd: soft, non tender Ext: 1+ edema Skin: no rashes  LABS:  BMET  Recent Labs Lab 05/27/15 0320 05/28/15 0427 05/29/15 0245  NA 144 145 145  K 3.6 3.4* 4.1  CL 107 107 105  CO2 28 28 29   BUN 16 16 17   CREATININE 1.33* 1.27* 1.47*  GLUCOSE 120* 121* 133*   Electrolytes  Recent Labs Lab 05/27/15 0320 05/28/15 0427 05/29/15 0245  CALCIUM 8.8* 8.6* 8.8*   CBC  Recent Labs Lab 05/27/15 0320 05/28/15 0427 05/29/15 0245  WBC 10.9* 11.4* 14.9*  HGB 7.0* 7.1* 6.5*  HCT 20.7* 21.1* 21.1*  PLT 385 493* 535*   Coag's No results for input(s): APTT, INR in the last 168 hours.  Sepsis Markers  Recent Labs Lab 05/25/15 1325 05/26/15 0500 05/27/15 0320  PROCALCITON 1.98 1.43 0.98   ABG No results for input(s): PHART, PCO2ART, PO2ART in the last 168 hours. Liver Enzymes No results for input(s): AST, ALT, ALKPHOS, BILITOT, ALBUMIN in the last 168 hours.  Cardiac Enzymes No results for input(s): TROPONINI, PROBNP in the last 168 hours.  Glucose  Recent Labs Lab 05/28/15 0427  05/28/15 0751 05/28/15 1128 05/28/15 1532 05/28/15 2351 05/29/15 0357  GLUCAP 119* 130* 131* 130* 118* 131*    Imaging Dg Chest Port 1 View  05/29/2015  CLINICAL DATA:  Respiratory failure.  Shortness of breath. EXAM: PORTABLE CHEST 1 VIEW COMPARISON:  05/28/2015. FINDINGS: Endotracheal tube, NG tube, right PICC line in stable position. Heart size stable. Diffuse bilateral airspace disease is again noted and unchanged. No pleural effusion or pneumothorax. IMPRESSION: 1. Lines and tubes in stable position. 2.  Diffuse bilateral airspace disease.  No interim change. Electronically Signed   By: Marcello Moores  Register   On: 05/29/2015 07:07     STUDIES:  03/10/2015 Echo >> EF  60-65%  CULTURES: 5/05 Urine >> negative 5/09 Blood >> negative 5/09 Sputum >> MRSA 5/14 Blood >>  ANTIBIOTICS: 5/02 Levaquin >> 5/03 5/04 Zosyn >> 5/07 5/04 Vancomycin >> 5/17 5/07 Rocephin >> 5/09 5/09 Fortaz >> 5/16  SIGNIFICANT EVENTS: 5/05 From rehab to [facility] with respiratory failure 5/09 To ICU >> VDRF 5/18 Transfuse PRBC, change to diprivan  LINES/TUBES: 5/04 Right PICC >> 5/09 ETT >>  DISCUSSION: 40 yo male had prolonged hospitalization for PNA with ARDS s/p tracheostomy >> eventually decannulated and transferred to rehab.  He developed progressive hypoxia leading to respiratory failure from MRSA pneumonia.  He has PMHx of autism, and sickle cell trait.  ASSESSMENT / PLAN:  PULMONARY A: Acute hypoxic  respiratory failure 2nd to HCAP and acute pulmonary edema. P:  Pressure support wean as tolerated Adjust PEEP/FiO2 to keep SpO2 > 92% F/u CXR Might need trach again to assist with vent weaning Check ESR  CARDIOVASCULAR A:  Septic shock. P:  Pressors to keep MAP > 65  RENAL A: AKI >> baseline creatinine 1.14 from 05/12/15. P:   Monitor renal fx, urine outpt  GASTROINTESTINAL A:   Nutrition. Diarrhea. P:   Tube feeds while on vent Protonix for SUP D/c colace, place  flexiseal  HEMATOLOGIC A:   Anemia of critical illness, iron deficiency. Hx of sickle cell trait. P:  F/u CBC Transfuse for Hb < 7 Added feosol and vitamin C 5/16 SQ heparin for DVT prevention  INFECTIOUS A:   MRSA HCAP >> completed Abx 5/17. P:   Monitor clinically  ENDOCRINE A:   Hyperglycemia. P:   SSI  NEUROLOGIC A:   Acute metabolic encephalopathy. Hx of autism. P:   RASS goal -1 to -2 Changed to diprivan, fentanyl gtt 5/18 Changed seroquel to 100 mg qhs on 5/15 Changed klonopin to 1 mg bid on 5/15   CC time 31 minutes.  Updated pt's sister on 5/17 >> explained he will likely need redo trach to assist with vent weaning.  Chesley Mires, MD [facility] Pulmonary/Critical Care 05/29/2015, 7:48 AM Pager:  [phone] After 3pm call: [phone]

## 2015-05-30 ENCOUNTER — Inpatient Hospital Stay (HOSPITAL_COMMUNITY): Payer: Medicare Other

## 2015-05-30 LAB — BASIC METABOLIC PANEL
Anion gap: 10 (ref 5–15)
BUN: 17 mg/dL (ref 6–20)
CO2: 31 mmol/L (ref 22–32)
Calcium: 8.8 mg/dL — ABNORMAL LOW (ref 8.9–10.3)
Chloride: 105 mmol/L (ref 101–111)
Creatinine, Ser: 1.25 mg/dL — ABNORMAL HIGH (ref 0.61–1.24)
GFR calc Af Amer: >60 mL/min (ref >60)
GLUCOSE: 119 mg/dL — AB (ref 65–99)
POTASSIUM: 3.8 mmol/L (ref 3.5–5.1)
Sodium: 146 mmol/L — ABNORMAL HIGH (ref 135–145)

## 2015-05-30 LAB — CBC
HEMATOCRIT: 24.2 % — AB (ref 39.0–52.0)
Hemoglobin: 7.6 g/dL — ABNORMAL LOW (ref 13.0–17.0)
MCH: 27 pg (ref 26.0–34.0)
MCHC: 31.4 g/dL (ref 30.0–36.0)
MCV: 85.8 fL (ref 78.0–100.0)
Platelets: 504 10*3/uL — ABNORMAL HIGH (ref 150–400)
RBC: 2.82 MIL/uL — ABNORMAL LOW (ref 4.22–5.81)
RDW: 16.6 % — AB (ref 11.5–15.5)
WBC: 14.1 10*3/uL — ABNORMAL HIGH (ref 4.0–10.5)

## 2015-05-30 LAB — GLUCOSE, CAPILLARY
GLUCOSE-CAPILLARY: 144 mg/dL — AB (ref 65–99)
GLUCOSE-CAPILLARY: 161 mg/dL — AB (ref 65–99)
Glucose-Capillary: 115 mg/dL — ABNORMAL HIGH (ref 65–99)
Glucose-Capillary: 141 mg/dL — ABNORMAL HIGH (ref 65–99)
Glucose-Capillary: 213 mg/dL — ABNORMAL HIGH (ref 65–99)
Glucose-Capillary: 147 mg/dL — ABNORMAL HIGH (ref 65–99)

## 2015-05-30 LAB — CULTURE, BLOOD (ROUTINE X 2): Culture: NO GROWTH

## 2015-05-30 MED ORDER — METHYLPREDNISOLONE SODIUM SUCC 40 MG IJ SOLR
40.0000 mg | Freq: Four times a day (QID) | INTRAMUSCULAR | Status: DC
  Administered 2015-05-30 – 2015-06-02 (×13): 40 mg via INTRAVENOUS
  Filled 2015-05-30 (×14): qty 1

## 2015-05-30 MED ORDER — INSULIN ASPART 100 UNIT/ML ~~LOC~~ SOLN
0.0000 [IU] | SUBCUTANEOUS | Status: DC
  Administered 2015-05-30: 4 [IU] via SUBCUTANEOUS
  Administered 2015-05-30: 7 [IU] via SUBCUTANEOUS
  Administered 2015-05-30 – 2015-06-04 (×16): 3 [IU] via SUBCUTANEOUS

## 2015-05-30 NOTE — Progress Notes (Signed)
PULMONARY / CRITICAL CARE MEDICINE   Name: Juan Cameron MRN: [id number] DOB: 1975-01-14    ADMISSION DATE: 05/16/2015  REFERRING MD:  Rehab MD  CHIEF COMPLAINT: Respiratory Distress/ Hypoxemia  SUBJECTIVE:  Diprivan working better for sedation.  Tolerating some pressure support.  VITAL SIGNS: BP 105/46 mmHg  Pulse 117  Temp(Src) 99.3 F (37.4 C) (Core (Comment))  Resp 20  Ht 5' 9"  (1.753 m)  Wt 215 lb 9.8 oz (97.8 kg)  BMI 31.83 kg/m2  SpO2 92%  VENT SETTINGS: Vent Mode:  [-] PSV;CPAP FiO2 (%):  [40 %-50 %] 40 % Set Rate:  [18 bmp] 18 bmp Vt Set:  [570 mL] 570 mL PEEP:  [5 cmH20] 5 cmH20 Pressure Support:  [10 cmH20-15 cmH20] 10 cmH20 Plateau Pressure:  [27 cmH20-40 cmH20] 40 cmH20  INTAKE / OUTPUT: I/O last 3 completed shifts: In: 6257.7 [I.V.:3180.2; Blood:372.5; NG/GT:2705] Out: 3299 [Urine:3180; Stool:500]  PHYSICAL EXAMINATION: General: sedated Neuro: RASS -1 HEENT: ETT in place Cardiac: regular, tachycardic Chest: no wheeze Abd: soft, non tender Ext: no edema Skin: no rashes  LABS:  BMET  Recent Labs Lab 05/28/15 0427 05/29/15 0245 05/30/15 0415  NA 145 145 146*  K 3.4* 4.1 3.8  CL 107 105 105  CO2 28 29 31   BUN 16 17 17   CREATININE 1.27* 1.47* 1.25*  GLUCOSE 121* 133* 119*   Electrolytes  Recent Labs Lab 05/28/15 0427 05/29/15 0245 05/30/15 0415  CALCIUM 8.6* 8.8* 8.8*   CBC  Recent Labs Lab 05/28/15 0427 05/29/15 0245 05/29/15 1223 05/30/15 0415  WBC 11.4* 14.9*  --  14.1*  HGB 7.1* 6.5* 7.2* 7.6*  HCT 21.1* 21.1* 21.1* 24.2*  PLT 493* 535*  --  504*   Coag's No results for input(s): APTT, INR in the last 168 hours.  Sepsis Markers  Recent Labs Lab 05/25/15 1325 05/26/15 0500 05/27/15 0320  PROCALCITON 1.98 1.43 0.98   ABG No results for input(s): PHART, PCO2ART, PO2ART in the last 168 hours. Liver Enzymes No results for input(s): AST, ALT, ALKPHOS, BILITOT, ALBUMIN in the last 168 hours.  Cardiac  Enzymes No results for input(s): TROPONINI, PROBNP in the last 168 hours.  Glucose  Recent Labs Lab 05/29/15 1110 05/29/15 1511 05/29/15 2010 05/29/15 2344 05/30/15 0345 05/30/15 0812  GLUCAP 237* 299* 129* 119* 115* 141*    Imaging Dg Chest Port 1 View  05/30/2015  CLINICAL DATA:  Respiratory failure, healthcare associated pneumonia, CHF, acute renal failure. EXAM: PORTABLE CHEST 1 VIEW COMPARISON:  Portable chest x-ray of May 29, 2015 FINDINGS: The lungs remain hypoinflated. The interstitial markings remain increased with areas of coarse interstitial density in the lower lung zones. The cardiac silhouette remains enlarged. The pulmonary vascularity remains prominent centrally. The endotracheal tube tip lies 2 point 5 cm above the carina. The esophagogastric tube tip projects below the inferior margin of the image. The right-sided PICC line tip projects over the midportion of the SVC. IMPRESSION: There has not been significant interval change in the appearance of the chest since yesterday's study. Persistent airspace opacities in both lungs are consistent with edema and/or pneumonia. Electronically Signed   By: David  Martinique M.D.   On: 05/30/2015 07:28     STUDIES:  03/10/2015 Echo >> EF  60-65%  CULTURES: 5/05 Urine >> negative 5/09 Blood >> negative 5/09 Sputum >> MRSA 5/14 Blood >>  ANTIBIOTICS: 5/02 Levaquin >> 5/03 5/04 Zosyn >> 5/07 5/04 Vancomycin >> 5/17 5/07 Rocephin >> 5/09 5/09 Fortaz >> 5/16  SIGNIFICANT  EVENTS: 5/05 From rehab to [facility] with respiratory failure 5/09 To ICU >> VDRF 5/18 Transfuse PRBC, change to diprivan 5/19 Add solumedrol  LINES/TUBES: 5/04 Right PICC >> 5/09 ETT >>  DISCUSSION: 40 yo male had prolonged hospitalization for PNA with ARDS s/p tracheostomy >> eventually decannulated and transferred to rehab.  He developed progressive hypoxia leading to respiratory failure from MRSA pneumonia.  He has PMHx of autism, and sickle cell  trait.  ASSESSMENT / PLAN:  PULMONARY A: Acute hypoxic respiratory failure 2nd to HCAP and acute pulmonary edema. P:  Pressure support wean as tolerated F/u CXR Might need trach again to assist with vent weaning Check ESR 120 >> Try course of steroids  CARDIOVASCULAR A:  Septic shock. P:  Pressors to keep MAP > 65  RENAL A: AKI >> baseline creatinine 1.14 from 05/12/15. P:   Monitor renal fx, urine outpt  GASTROINTESTINAL A:   Nutrition. Diarrhea. P:   Tube feeds while on vent Protonix for SUP Flexiseal  HEMATOLOGIC A:   Anemia of critical illness, iron deficiency. Hx of sickle cell trait. P:  F/u CBC Transfuse for Hb < 7 Added feosol and vitamin C 5/16 SQ heparin for DVT prevention  INFECTIOUS A:   MRSA HCAP >> completed Abx 5/17. P:   Monitor clinically  ENDOCRINE A:   Hyperglycemia. P:   SSI  NEUROLOGIC A:   Acute metabolic encephalopathy. Hx of autism. P:   RASS goal -1 to -2 Changed to diprivan, fentanyl gtt 5/18 Changed seroquel to 100 mg qhs on 5/15 Changed klonopin to 1 mg bid on 5/15   CC time 32 minutes.  Updated family at bedside.  Chesley Mires, MD [facility] Pulmonary/Critical Care 05/30/2015, 10:35 AM Pager:  [phone] After 3pm call: [phone]

## 2015-05-31 ENCOUNTER — Inpatient Hospital Stay (HOSPITAL_COMMUNITY): Payer: Medicare Other

## 2015-05-31 LAB — BASIC METABOLIC PANEL
ANION GAP: 8 (ref 5–15)
BUN: 23 mg/dL — ABNORMAL HIGH (ref 6–20)
CHLORIDE: 106 mmol/L (ref 101–111)
CO2: 30 mmol/L (ref 22–32)
Calcium: 8.8 mg/dL — ABNORMAL LOW (ref 8.9–10.3)
Creatinine, Ser: 1.12 mg/dL (ref 0.61–1.24)
GFR calc non Af Amer: >60 mL/min (ref >60)
GLUCOSE: 145 mg/dL — AB (ref 65–99)
Potassium: 4.5 mmol/L (ref 3.5–5.1)
Sodium: 144 mmol/L (ref 135–145)

## 2015-05-31 LAB — CBC
HEMATOCRIT: 23.4 % — AB (ref 39.0–52.0)
HEMOGLOBIN: 7.1 g/dL — AB (ref 13.0–17.0)
MCH: 26.1 pg (ref 26.0–34.0)
MCHC: 30.3 g/dL (ref 30.0–36.0)
MCV: 86 fL (ref 78.0–100.0)
Platelets: 498 10*3/uL — ABNORMAL HIGH (ref 150–400)
RBC: 2.72 MIL/uL — AB (ref 4.22–5.81)
RDW: 16.9 % — ABNORMAL HIGH (ref 11.5–15.5)
WBC: 18.3 10*3/uL — ABNORMAL HIGH (ref 4.0–10.5)

## 2015-05-31 LAB — GLUCOSE, CAPILLARY
GLUCOSE-CAPILLARY: 131 mg/dL — AB (ref 65–99)
GLUCOSE-CAPILLARY: 136 mg/dL — AB (ref 65–99)
GLUCOSE-CAPILLARY: 141 mg/dL — AB (ref 65–99)
GLUCOSE-CAPILLARY: 125 mg/dL — AB (ref 65–99)
Glucose-Capillary: 130 mg/dL — ABNORMAL HIGH (ref 65–99)

## 2015-05-31 LAB — HEMOGLOBIN AND HEMATOCRIT, BLOOD
HCT: 26.3 % — ABNORMAL LOW (ref 39.0–52.0)
Hemoglobin: 8.2 g/dL — ABNORMAL LOW (ref 13.0–17.0)

## 2015-05-31 LAB — PREPARE RBC (CROSSMATCH)

## 2015-05-31 MED ORDER — DEXMEDETOMIDINE HCL IN NACL 400 MCG/100ML IV SOLN
0.4000 ug/kg/h | INTRAVENOUS | Status: AC
  Administered 2015-05-31: 0.402 ug/kg/h via INTRAVENOUS
  Administered 2015-05-31: 0.8 ug/kg/h via INTRAVENOUS
  Administered 2015-05-31: 0.4 ug/kg/h via INTRAVENOUS
  Administered 2015-06-01 (×2): 0.8 ug/kg/h via INTRAVENOUS
  Filled 2015-05-31: qty 50
  Filled 2015-05-31 (×5): qty 100

## 2015-05-31 MED ORDER — FENTANYL CITRATE (PF) 100 MCG/2ML IJ SOLN
50.0000 ug | INTRAMUSCULAR | Status: DC | PRN
  Administered 2015-06-01: 25 ug via INTRAVENOUS
  Administered 2015-06-02 (×2): 50 ug via INTRAVENOUS
  Administered 2015-06-03: 12.5 ug via INTRAVENOUS
  Administered 2015-06-03: 50 ug via INTRAVENOUS
  Filled 2015-05-31 (×5): qty 2

## 2015-05-31 MED ORDER — FUROSEMIDE 10 MG/ML IJ SOLN
40.0000 mg | Freq: Once | INTRAMUSCULAR | Status: AC
  Administered 2015-05-31: 40 mg via INTRAVENOUS
  Filled 2015-05-31: qty 4

## 2015-05-31 MED ORDER — SODIUM CHLORIDE 0.9 % IV SOLN
Freq: Once | INTRAVENOUS | Status: AC
  Administered 2015-05-31: 12:00:00 via INTRAVENOUS

## 2015-05-31 MED ORDER — LORAZEPAM 2 MG/ML IJ SOLN
1.0000 mg | INTRAMUSCULAR | Status: DC | PRN
  Administered 2015-06-02 – 2015-06-03 (×3): 2 mg via INTRAVENOUS
  Filled 2015-05-31 (×3): qty 1

## 2015-05-31 NOTE — Procedures (Signed)
Extubation Procedure Note  Patient Details:   Name: Juan RipperRobert Cameron DOB: 1975/06/24 MRN: [id number]   Airway Documentation:  Airway 7.5 mm (Active)  Secured at (cm) 24 cm 05/31/2015  8:00 AM  Measured From Lips 05/31/2015  8:00 AM  Secured Location Right 05/31/2015  8:00 AM  Secured By [employer] Tube Holder 05/31/2015  8:00 AM  Tube Holder Repositioned Yes 05/31/2015  8:00 AM  Cuff Pressure (cm H2O) 22 cm H2O 05/28/2015  3:41 PM  Site Condition Dry 05/31/2015  8:00 AM    Evaluation  O2 sats: transiently fell during during procedure and currently acceptable Complications: Complications of Patient's SAT's decreased to the 50's. Placed on 100% NRB. SAT's are currently 100%. (CCM NP is aware) Patient did tolerate procedure well. Bilateral Breath Sounds: Rhonchi, Diminished (Simultaneous filing. User may not have seen previous data.)   Yes   Pt. Was extubated to a 100% NRB without any respiratory distress or stridor noted.   Shah Insley, Margaretmary Dysshley L 05/31/2015, 11:35 AM

## 2015-05-31 NOTE — Progress Notes (Signed)
Per MD verbal order to NTS patient to make sure airway is patent and no mucous plug is present. Pt does has a strong cough. Pt was NTS x2  assisted by RN, pt was highly agitated with the procedure, but sats remain stable throughout. Pt heart rate did brady down in the upper 40's. RN and MD aware of the bradycardia. Got back small amount of tan clear-like thick secretions. RT will continue to monitor.

## 2015-05-31 NOTE — Progress Notes (Signed)
PULMONARY / CRITICAL CARE MEDICINE   Name: Juan Cameron MRN: [id number] DOB: 31-Jan-1975    ADMISSION DATE: 05/16/2015  REFERRING MD:  Rehab MD  CHIEF COMPLAINT: Respiratory Distress/ Hypoxemia  SUBJECTIVE:  Tolerating SBT.  VITAL SIGNS: BP 117/68 mmHg  Pulse 123  Temp(Src) 98.4 F (36.9 C) (Core (Comment))  Resp 28  Ht _0  (1.753 m)  Wt 210 lb 12.2 oz (95.6 kg)  BMI 31.11 kg/m2  SpO2 88%  VENT SETTINGS: Vent Mode:  [-] PSV;PRVC FiO2 (%):  [40 %-50 %] 40 % Set Rate:  [18 bmp] 18 bmp Vt Set:  [570 mL] 570 mL PEEP:  [5 cmH20] 5 cmH20 Pressure Support:  [10 cmH20] 10 cmH20 Plateau Pressure:  [28 cmH20-38 cmH20] 37 cmH20  INTAKE / OUTPUT: I/O last 3 completed shifts: In: 4578.8 [I.V.:2693.8; NG/GT:1885] Out: 3055 [Urine:2755; Stool:300]  PHYSICAL EXAMINATION: General: alert Neuro: RASS 0 HEENT: ETT in place Cardiac: regular, tachycardic Chest: no wheeze Abd: soft, non tender Ext: no edema Skin: no rashes  LABS:  BMET  Recent Labs Lab 05/29/15 0245 05/30/15 0415 05/31/15 0350  NA 145 146* 144  K 4.1 3.8 4.5  CL 105 105 106  CO2 _1 BUN 17 17 23*  CREATININE 1.47* 1.25* 1.12  GLUCOSE 133* 119* 145*   Electrolytes  Recent Labs Lab 05/29/15 0245 05/30/15 0415 05/31/15 0350  CALCIUM 8.8* 8.8* 8.8*   CBC  Recent Labs Lab 05/29/15 0245 05/29/15 1223 05/30/15 0415 05/31/15 0350  WBC 14.9*  --  14.1* 18.3*  HGB 6.5* 7.2* 7.6* 7.1*  HCT 21.1* 21.1* 24.2* 23.4*  PLT 535*  --  504* 498*   Coag's No results for input(s): APTT, INR in the last 168 hours.  Sepsis Markers  Recent Labs Lab 05/25/15 1325 05/26/15 0500 05/27/15 0320  PROCALCITON 1.98 1.43 0.98   ABG No results for input(s): PHART, PCO2ART, PO2ART in the last 168 hours. Liver Enzymes No results for input(s): AST, ALT, ALKPHOS, BILITOT, ALBUMIN in the last 168 hours.  Cardiac Enzymes No results for input(s): TROPONINI, PROBNP in the last 168  hours.  Glucose  Recent Labs Lab 05/30/15 1158 05/30/15 1557 05/30/15 1927 05/30/15 2357 05/31/15 0319 05/31/15 0750  GLUCAP 161* 213* 147* 144* 131* 136*    Imaging Dg Chest Port 1 View  05/31/2015  CLINICAL DATA:  Respiratory failure EXAM: PORTABLE CHEST 1 VIEW COMPARISON:  Chest radiograph from one day prior. FINDINGS: Endotracheal tube tip is 2.0 cm above the carina. Enteric tube enters stomach with the tip not seen on this image. Right PICC terminates in the lower third of the superior vena cava. Stable cardiomediastinal silhouette with normal heart size. No pneumothorax. No pleural effusion. Low lung volumes. Patchy opacities throughout both lungs are not appreciably changed. IMPRESSION: 1. Support structures as described . 2. Low lung volumes with stable patchy opacities throughout both lungs, probably representing either multifocal pneumonia and/or ARDS. Electronically Signed   By: Ilona Sorrel M.D.   On: 05/31/2015 10:39     STUDIES:  03/10/2015 Echo >> EF  60-65%  CULTURES: 5/05 Urine >> negative 5/09 Blood >> negative 5/09 Sputum >> MRSA 5/14 Blood >> negative  ANTIBIOTICS: 5/02 Levaquin >> 5/03 5/04 Zosyn >> 5/07 5/04 Vancomycin >> 5/17 5/07 Rocephin >> 5/09 5/09 Fortaz >> 5/16  SIGNIFICANT EVENTS: 5/05 From rehab to [facility] with respiratory failure 5/09 To ICU >> VDRF 5/18 Transfuse PRBC, change to diprivan 5/19 Add solumedrol  LINES/TUBES: 5/04 Right PICC >> 5/09 ETT >>  5/20  DISCUSSION: 40 yo male had prolonged hospitalization for PNA with ARDS s/p tracheostomy >> eventually decannulated and transferred to rehab.  He developed progressive hypoxia leading to respiratory failure from MRSA pneumonia.  He has PMHx of autism, and sickle cell trait.  ASSESSMENT / PLAN:  PULMONARY A: Acute hypoxic respiratory failure 2nd to HCAP and acute pulmonary edema. P:  Proceed with extubation 5/20 >> if fails, then re intubate and arrange for trach F/u CXR ESR 120  >> started course of steroids 5/19  CARDIOVASCULAR A:  Septic shock. P:  Pressors to keep MAP > 65  RENAL A: AKI >> baseline creatinine 1.14 from 05/12/15. P:   Monitor renal fx, urine outpt  GASTROINTESTINAL A:   Nutrition. Diarrhea. Dysphagia. P:   Speech to assess swallowing after extubation Protonix for SUP Flexiseal  HEMATOLOGIC A:   Anemia of critical illness, iron deficiency. Hx of sickle cell trait. P:  F/u CBC Transfuse for Hb < 7 - transfuse 1 unit PRBC 5/20 and give lasix 40 mg IV x one Added feosol and vitamin C 5/16 SQ heparin for DVT prevention  INFECTIOUS A:   MRSA HCAP >> completed Abx 5/17. P:   Monitor clinically  ENDOCRINE A:   Hyperglycemia. P:   SSI  NEUROLOGIC A:   Acute metabolic encephalopathy. Hx of autism. P:   Changed seroquel to 100 mg qhs on 5/15 Changed klonopin to 1 mg bid on 5/15  Prn IV fentanyl, ativan after extubation  CC time 31 minutes.  Updated family at bedside.  Chesley Mires, MD [facility] Pulmonary/Critical Care 05/31/2015, 11:21 AM Pager:  [phone] After 3pm call: [phone]

## 2015-05-31 NOTE — Progress Notes (Signed)
Attempted to titrate pts venturi mask from 50% to 45%.  Pt sats droppedto 78% and patient began having pauses in HR.  Pt placed on 55% venturi mask with sats increasing to 83%.  Pt then placed back on 100% nonrebreather with sats increasing to 100%.  Will attempt to wean FiO2 throughout night as pt tolerates.

## 2015-06-01 ENCOUNTER — Inpatient Hospital Stay (HOSPITAL_COMMUNITY): Payer: Medicare Other

## 2015-06-01 LAB — CBC
HEMATOCRIT: 29.8 % — AB (ref 39.0–52.0)
Hemoglobin: 9.3 g/dL — ABNORMAL LOW (ref 13.0–17.0)
MCH: 26.6 pg (ref 26.0–34.0)
MCHC: 31.2 g/dL (ref 30.0–36.0)
MCV: 85.1 fL (ref 78.0–100.0)
Platelets: 572 10*3/uL — ABNORMAL HIGH (ref 150–400)
RBC: 3.5 MIL/uL — ABNORMAL LOW (ref 4.22–5.81)
RDW: 17.8 % — AB (ref 11.5–15.5)
WBC: 29.8 10*3/uL — AB (ref 4.0–10.5)

## 2015-06-01 LAB — BASIC METABOLIC PANEL
ANION GAP: 13 (ref 5–15)
BUN: 31 mg/dL — ABNORMAL HIGH (ref 6–20)
CALCIUM: 9.5 mg/dL (ref 8.9–10.3)
CO2: 29 mmol/L (ref 22–32)
Chloride: 105 mmol/L (ref 101–111)
Creatinine, Ser: 1.08 mg/dL (ref 0.61–1.24)
GFR calc Af Amer: >60 mL/min (ref >60)
GFR calc non Af Amer: >60 mL/min (ref >60)
GLUCOSE: 125 mg/dL — AB (ref 65–99)
Potassium: 3.6 mmol/L (ref 3.5–5.1)
Sodium: 147 mmol/L — ABNORMAL HIGH (ref 135–145)

## 2015-06-01 LAB — TYPE AND SCREEN
ABO/RH(D): A POS
Antibody Screen: NEGATIVE
UNIT DIVISION: 0
Unit division: 0

## 2015-06-01 LAB — GLUCOSE, CAPILLARY
GLUCOSE-CAPILLARY: 116 mg/dL — AB (ref 65–99)
GLUCOSE-CAPILLARY: 112 mg/dL — AB (ref 65–99)
GLUCOSE-CAPILLARY: 116 mg/dL — AB (ref 65–99)
Glucose-Capillary: 117 mg/dL — ABNORMAL HIGH (ref 65–99)
Glucose-Capillary: 130 mg/dL — ABNORMAL HIGH (ref 65–99)
Glucose-Capillary: 133 mg/dL — ABNORMAL HIGH (ref 65–99)

## 2015-06-01 MED ORDER — FUROSEMIDE 10 MG/ML IJ SOLN
40.0000 mg | Freq: Once | INTRAMUSCULAR | Status: AC
  Administered 2015-06-01: 40 mg via INTRAVENOUS
  Filled 2015-06-01: qty 4

## 2015-06-01 MED ORDER — DEXMEDETOMIDINE HCL IN NACL 400 MCG/100ML IV SOLN
0.4000 ug/kg/h | INTRAVENOUS | Status: AC
  Administered 2015-06-01: 0.7 ug/kg/h via INTRAVENOUS
  Administered 2015-06-02: 0.6 ug/kg/h via INTRAVENOUS
  Administered 2015-06-02: 0.7 ug/kg/h via INTRAVENOUS
  Filled 2015-06-01 (×4): qty 100

## 2015-06-01 MED ORDER — PANTOPRAZOLE SODIUM 40 MG IV SOLR
40.0000 mg | INTRAVENOUS | Status: DC
  Administered 2015-06-01 – 2015-06-03 (×3): 40 mg via INTRAVENOUS
  Filled 2015-06-01 (×4): qty 40

## 2015-06-01 NOTE — Progress Notes (Signed)
PULMONARY / CRITICAL CARE MEDICINE   Name: Juan Cameron MRN: [id number] DOB: 05-17-1975    ADMISSION DATE: 05/16/2015  REFERRING MD:  Rehab MD  CHIEF COMPLAINT: Respiratory Distress/ Hypoxemia  SUBJECTIVE:  Intermittent bradycardia with coughing.  VITAL SIGNS: BP 151/95 mmHg  Pulse 80  Temp(Src) 98.8 F (37.1 C) (Oral)  Resp 28  Ht 5' 9"  (1.753 m)  Wt 204 lb 12.9 oz (92.9 kg)  BMI 30.23 kg/m2  SpO2 97%  VENT SETTINGS: Vent Mode:  [-]  FiO2 (%):  [45 %-100 %] 50 %  INTAKE / OUTPUT: I/O last 3 completed shifts: In: 2894 [I.V.:1271.5; Blood:292.5; Other:130; NG/GT:1200] Out: 3254 [Urine:3595; Stool:25]  PHYSICAL EXAMINATION: General: alert Neuro: RASS -1 HEENT: no stridor Cardiac: regular Chest: b/l crackles Abd: soft, non tender Ext: no edema Skin: no rashes  LABS:  BMET  Recent Labs Lab 05/30/15 0415 05/31/15 0350 06/01/15 0333  NA 146* 144 147*  K 3.8 4.5 3.6  CL 105 106 105  CO2 31 30 29   BUN 17 23* 31*  CREATININE 1.25* 1.12 1.08  GLUCOSE 119* 145* 125*   Electrolytes  Recent Labs Lab 05/30/15 0415 05/31/15 0350 06/01/15 0333  CALCIUM 8.8* 8.8* 9.5   CBC  Recent Labs Lab 05/30/15 0415 05/31/15 0350 05/31/15 1540 06/01/15 0333  WBC 14.1* 18.3*  --  29.8*  HGB 7.6* 7.1* 8.2* 9.3*  HCT 24.2* 23.4* 26.3* 29.8*  PLT 504* 498*  --  572*   Coag's No results for input(s): APTT, INR in the last 168 hours.  Sepsis Markers  Recent Labs Lab 05/25/15 1325 05/26/15 0500 05/27/15 0320  PROCALCITON 1.98 1.43 0.98   ABG No results for input(s): PHART, PCO2ART, PO2ART in the last 168 hours. Liver Enzymes No results for input(s): AST, ALT, ALKPHOS, BILITOT, ALBUMIN in the last 168 hours.  Cardiac Enzymes No results for input(s): TROPONINI, PROBNP in the last 168 hours.  Glucose  Recent Labs Lab 05/31/15 1147 05/31/15 1521 05/31/15 1949 06/01/15 0009 06/01/15 0336 06/01/15 0745  GLUCAP 141* 125* 130* 130* 116* 133*     Imaging Dg Chest Port 1 View  06/01/2015  CLINICAL DATA:  Extubation.  Followup pneumonia/ ARDS. EXAM: PORTABLE CHEST 1 VIEW COMPARISON:  05/31/2015 and earlier. FINDINGS: Right arm PICC tip projects over the mid SVC, unchanged. Interval extubation and nasogastric tube removal. Cardiac silhouette upper normal in size to slightly enlarged for technique and degree of inspiration, unchanged. Patchy airspace opacities throughout both lungs, unchanged. Possible bilateral effusions, unchanged. No new pulmonary parenchymal abnormalities. IMPRESSION: 1. Support apparatus satisfactory. 2. Stable patchy pneumonia and/or ARDS throughout both lungs. 3. Stable small bilateral effusions. 4. No new abnormalities. Electronically Signed   By: Evangeline Dakin M.D.   On: 06/01/2015 10:16     STUDIES:  03/10/2015 Echo >> EF  60-65%  CULTURES: 5/05 Urine >> negative 5/09 Blood >> negative 5/09 Sputum >> MRSA 5/14 Blood >> negative  ANTIBIOTICS: 5/02 Levaquin >> 5/03 5/04 Zosyn >> 5/07 5/04 Vancomycin >> 5/17 5/07 Rocephin >> 5/09 5/09 Fortaz >> 5/16  SIGNIFICANT EVENTS: 5/05 From rehab to [facility] with respiratory failure 5/09 To ICU >> VDRF 5/18 Transfuse PRBC, change to diprivan 5/19 Add solumedrol 5/20 Transfuse PRBC  LINES/TUBES: 5/04 Right PICC >> 5/09 ETT >> 5/20  DISCUSSION: 40 yo male had prolonged hospitalization for PNA with ARDS s/p tracheostomy >> eventually decannulated and transferred to rehab.  He developed progressive hypoxia leading to respiratory failure from MRSA pneumonia.  He has PMHx of autism, and  sickle cell trait.  ASSESSMENT / PLAN:  PULMONARY A: Acute hypoxic respiratory failure 2nd to HCAP and acute pulmonary edema. P:  Oxygen to keep SpO2 > 92% F/u CXR ESR 120 >> started course of steroids 5/19 >> likely can start weaning off 5/22  CARDIOVASCULAR A:  Septic shock > off pressors 5/22. P:  Monitor hemodynamics  RENAL A: AKI >> baseline creatinine 1.14  from 05/12/15. Hypervolemia. P:   Monitor renal fx, urine outpt Lasix 40 mg IV x one 5/21  GASTROINTESTINAL A:   Nutrition. Diarrhea. Dysphagia. P:   Speech to assess swallowing after extubation Protonix for SUP Flexiseal  HEMATOLOGIC A:   Anemia of critical illness, iron deficiency. Hx of sickle cell trait. P:  F/u CBC Transfuse for Hb < 7 Added feosol and vitamin C 5/16 SQ heparin for DVT prevention  INFECTIOUS A:   MRSA HCAP >> completed Abx 5/17. P:   Monitor clinically  ENDOCRINE A:   Hyperglycemia. P:   SSI  NEUROLOGIC A:   Acute metabolic encephalopathy. Hx of autism. Deconditioning. P:   Changed seroquel to 100 mg qhs on 5/15 Changed klonopin to 1 mg bid on 5/15  Continue precedex Prn IV fentanyl, ativan after extubation PT/OT  CC time 31 minutes.  Chesley Mires, MD [facility] Pulmonary/Critical Care 06/01/2015, 10:37 AM Pager:  [phone] After 3pm call: [phone]

## 2015-06-01 NOTE — Evaluation (Signed)
Clinical/Bedside Swallow Evaluation Patient Details  Name: Juan Cameron MRN: [id number] Date of Birth: 1975-06-21  Today's Date: 06/01/2015 Time: SLP Start Time (ACUTE ONLY): 1100 SLP Stop Time (ACUTE ONLY): 1118 SLP Time Calculation (min) (ACUTE ONLY): 18 min  Past Medical History:  Past Medical History  Diagnosis Date  . Autism   . Sickle cell trait (HCC)   . Pneumonia 02/2015  . Autism    Past Surgical History:  Past Surgical History  Procedure Laterality Date  . No past surgeries     HPI:  Pt is a 40 y.o. male with PMH of autism and ADHD , sickle cell trait, independent prior to admission. Pt was admitted for PNA and discharged 03/10/15 readmitted 04/07/15 with respiratory failure requiring intubation, tracheostomy 03/20/2015; failed extubation and [facility]. Pt  decannulated 04/25/2015, diet slowly advanced to a regular consistency, bouts of tachycardia atrial fibrillation converted back to normal sinus rhythm and transferred to CIR.  SLP evaluated 5/4 and was being followed for dysphagia due to deconditioning and cognitive impairment but was on regular solids/ thin liquids. Transferred back to acute venue with desaturation in the 70's. BSE completed 5/7 recommending regular diet/thin liquids; continued with respiratory insufficiency and intubated 5/9-5/20 and reorders to assess swallow function.    Assessment / Plan / Recommendation Clinical Impression  Pt has had a significant and complicated medical course of poor respiratory status, decreased endurance, deconditioning since March of this year. Extubated yesterday following 11 day inutbation and swallow assessment ordered today. Provided small amounts ice chip and applesauce to aawake but drowsy nonverbal patient on a Venti mask. Facial grimace during what appeared to be a weak swallow with decreased palpable laryngeal elevation. Pt will require objective assessment when appropriate with improved  alertness and respiratory status. SLP will return next date, however he may require several days of alternative nutrition.     Aspiration Risk  Severe aspiration risk    Diet Recommendation NPO   Medication Administration: Via alternative means    Other  Recommendations Oral Care Recommendations: Oral care QID   Follow up Recommendations  24 hour supervision/assistance    Frequency and Duration min 2x/week  2 weeks       Prognosis Prognosis for Safe Diet Advancement: Good Barriers to Reach Goals: Cognitive deficits;Time post onset;Severity of deficits      Swallow Study   General HPI: Pt is a 40 y.o. male with PMH of autism and ADHD , sickle cell trait, independent prior to admission. Pt was admitted for PNA and discharged 03/10/15 readmitted 04/07/15 with respiratory failure requiring intubation, tracheostomy 03/20/2015; failed extubation and [facility]. Pt  decannulated 04/25/2015, diet slowly advanced to a regular consistency, bouts of tachycardia atrial fibrillation converted back to normal sinus rhythm and transferred to CIR.  SLP evaluated 5/4 and was being followed for dysphagia due to deconditioning and cognitive impairment but was on regular solids/ thin liquids. Transferred back to acute venue with desaturation in the 70's. BSE completed 5/7 recommending regular diet/thin liquids; continued with respiratory insufficiency and intubated 5/9-5/20 and reorders to assess swallow function.  Type of Study: Bedside Swallow Evaluation Previous Swallow Assessment:  (see HPI) Diet Prior to this Study: NPO Temperature Spikes Noted: No Respiratory Status: Venti-mask History of Recent Intubation: Yes Length of Intubations (days):  (11) Date extubated: 05/31/15 Behavior/Cognition: Lethargic/Drowsy;Cooperative;Pleasant mood;Requires cueing Oral Cavity Assessment:  (lips dry) Oral Care Completed by SLP: Yes Oral Cavity - Dentition: Adequate natural  dentition  Vision:  (difficult to assess) Self-Feeding Abilities: Total assist Patient Positioning: Upright in bed Baseline Vocal Quality:  (no attempts to phonate) Volitional Cough: Cognitively unable to elicit Volitional Swallow: Unable to elicit    Oral/Motor/Sensory Function Overall Oral Motor/Sensory Function:  (did not follow commands for exam)   Ice Chips Ice chips: Impaired Presentation: Spoon Oral Phase Impairments: Reduced labial seal;Reduced lingual movement/coordination Oral Phase Functional Implications: Prolonged oral transit;Oral holding Pharyngeal Phase Impairments: Suspected delayed Swallow;Decreased hyoid-laryngeal movement   Thin Liquid Thin Liquid: Not tested    Nectar Thick Nectar Thick Liquid: Not tested   Honey Thick Honey Thick Liquid: Not tested   Puree Puree: Impaired Oral Phase Impairments: Reduced lingual movement/coordination;Reduced labial seal Oral Phase Functional Implications: Prolonged oral transit Pharyngeal Phase Impairments: Decreased hyoid-laryngeal movement;Suspected delayed Swallow   Solid   GO   Solid: Not tested        Juan Cameron, Juan Cameron 06/01/2015,12:23 PM  [phone]

## 2015-06-02 ENCOUNTER — Inpatient Hospital Stay (HOSPITAL_COMMUNITY): Payer: Medicare Other

## 2015-06-02 LAB — HEPATIC FUNCTION PANEL
ALT: 23 U/L (ref 17–63)
AST: 27 U/L (ref 15–41)
Albumin: 2.5 g/dL — ABNORMAL LOW (ref 3.5–5.0)
Alkaline Phosphatase: 43 U/L (ref 38–126)
Bilirubin, Direct: 0.1 mg/dL (ref 0.1–0.5)
Indirect Bilirubin: 0.5 mg/dL (ref 0.3–0.9)
Total Bilirubin: 0.6 mg/dL (ref 0.3–1.2)
Total Protein: 6.1 g/dL — ABNORMAL LOW (ref 6.5–8.1)

## 2015-06-02 LAB — CBC
HCT: 29.4 % — ABNORMAL LOW (ref 39.0–52.0)
Hemoglobin: 9.2 g/dL — ABNORMAL LOW (ref 13.0–17.0)
MCH: 26.4 pg (ref 26.0–34.0)
MCHC: 31.3 g/dL (ref 30.0–36.0)
MCV: 84.2 fL (ref 78.0–100.0)
PLATELETS: 521 10*3/uL — AB (ref 150–400)
RBC: 3.49 MIL/uL — ABNORMAL LOW (ref 4.22–5.81)
RDW: 17.7 % — ABNORMAL HIGH (ref 11.5–15.5)
WBC: 20.3 10*3/uL — ABNORMAL HIGH (ref 4.0–10.5)

## 2015-06-02 LAB — BASIC METABOLIC PANEL
Anion gap: 7 (ref 5–15)
BUN: 34 mg/dL — AB (ref 6–20)
CALCIUM: 9.5 mg/dL (ref 8.9–10.3)
CHLORIDE: 111 mmol/L (ref 101–111)
CO2: 34 mmol/L — AB (ref 22–32)
CREATININE: 1.19 mg/dL (ref 0.61–1.24)
GFR calc Af Amer: >60 mL/min (ref >60)
GFR calc non Af Amer: >60 mL/min (ref >60)
GLUCOSE: 130 mg/dL — AB (ref 65–99)
Potassium: 3.6 mmol/L (ref 3.5–5.1)
Sodium: 152 mmol/L — ABNORMAL HIGH (ref 135–145)

## 2015-06-02 LAB — GLUCOSE, CAPILLARY
GLUCOSE-CAPILLARY: 135 mg/dL — AB (ref 65–99)
GLUCOSE-CAPILLARY: 130 mg/dL — AB (ref 65–99)
GLUCOSE-CAPILLARY: 109 mg/dL — AB (ref 65–99)
Glucose-Capillary: 118 mg/dL — ABNORMAL HIGH (ref 65–99)
Glucose-Capillary: 128 mg/dL — ABNORMAL HIGH (ref 65–99)
Glucose-Capillary: 135 mg/dL — ABNORMAL HIGH (ref 65–99)

## 2015-06-02 LAB — LIPASE, BLOOD: Lipase: 98 U/L — ABNORMAL HIGH (ref 11–51)

## 2015-06-02 LAB — AMYLASE: Amylase: 132 U/L — ABNORMAL HIGH (ref 28–100)

## 2015-06-02 MED ORDER — LABETALOL HCL 5 MG/ML IV SOLN
10.0000 mg | INTRAVENOUS | Status: DC | PRN
Start: 2015-06-02 — End: 2015-06-10
  Administered 2015-06-02 – 2015-06-03 (×2): 20 mg via INTRAVENOUS
  Administered 2015-06-03 – 2015-06-04 (×3): 10 mg via INTRAVENOUS
  Filled 2015-06-02 (×5): qty 4

## 2015-06-02 MED ORDER — SODIUM CHLORIDE 0.45 % IV SOLN
INTRAVENOUS | Status: DC
  Administered 2015-06-02: 14:00:00 via INTRAVENOUS

## 2015-06-02 MED ORDER — ALTEPLASE 2 MG IJ SOLR
2.0000 mg | Freq: Once | INTRAMUSCULAR | Status: AC
  Administered 2015-06-02: 2 mg
  Filled 2015-06-02: qty 2

## 2015-06-02 MED ORDER — METHYLPREDNISOLONE SODIUM SUCC 40 MG IJ SOLR
40.0000 mg | Freq: Two times a day (BID) | INTRAMUSCULAR | Status: DC
  Administered 2015-06-02 – 2015-06-03 (×3): 40 mg via INTRAVENOUS
  Filled 2015-06-02 (×4): qty 1

## 2015-06-02 NOTE — Progress Notes (Signed)
PULMONARY / CRITICAL CARE MEDICINE   Name: Juan Cameron MRN: [id number] DOB: April 13, 1975    ADMISSION DATE: 05/16/2015  REFERRING MD:  Rehab MD  CHIEF COMPLAINT: Respiratory Distress/ Hypoxemia  SUBJECTIVE:  Patient was c/o "stabbing" abdominal pain earlier today that was not relieved with Zofran. Patient was able to get up to the side of the bed with PT. Patient does signify abdominal pain but cannot describe further.   REVIEW OF SYSTEMS:  Unable to obtain given mental state.  VITAL SIGNS: BP 170/97 mmHg  Pulse 79  Temp(Src) 98.8 F (37.1 C) (Oral)  Resp 20  Ht 5' 9"  (1.753 m)  Wt 203 lb 4.2 oz (92.2 kg)  BMI 30.00 kg/m2  SpO2 100%  VENT SETTINGS: Vent Mode:  [-]  FiO2 (%):  [50 %-100 %] 100 %  INTAKE / OUTPUT: I/O last 3 completed shifts: In: 958.3 [I.V.:608.3; Other:350] Out: 2992 [EQAST:4196; Stool:100]  PHYSICAL EXAMINATION: General:  Awake.No acute distress. Mother at bedside.  Integument:  Warm & dry. No rash on exposed skin. No bruising. HEENT:  No scleral injection or icterus. PERRL. Cardiovascular:  Regular rate. No edema. No appreciable JVD.  Pulmonary:  Diminished breath sounds in bilateral bases. Normal work of breathing on Partial NRB. Symmetric chest wall rise. Abdomen: Soft. Normal bowel sounds. Nondistended. Diffusely tender to palpation, particularly in upper quadrants. Neurological:  Follows commands. Doesn't answer questions. Averts eyes at times during questioning.  LABS:  BMET  Recent Labs Lab 05/31/15 0350 06/01/15 0333 06/02/15 0300  NA 144 147* 152*  K 4.5 3.6 3.6  CL 106 105 111  CO2 30 29 34*  BUN 23* 31* 34*  CREATININE 1.12 1.08 1.19  GLUCOSE 145* 125* 130*   Electrolytes  Recent Labs Lab 05/31/15 0350 06/01/15 0333 06/02/15 0300  CALCIUM 8.8* 9.5 9.5   CBC  Recent Labs Lab 05/31/15 0350 05/31/15 1540 06/01/15 0333 06/02/15 0300  WBC 18.3*  --  29.8* 20.3*  HGB 7.1* 8.2* 9.3* 9.2*  HCT 23.4* 26.3* 29.8* 29.4*   PLT 498*  --  572* 521*   Coag's No results for input(s): APTT, INR in the last 168 hours.  Sepsis Markers  Recent Labs Lab 05/27/15 0320  PROCALCITON 0.98   ABG No results for input(s): PHART, PCO2ART, PO2ART in the last 168 hours. Liver Enzymes No results for input(s): AST, ALT, ALKPHOS, BILITOT, ALBUMIN in the last 168 hours.  Cardiac Enzymes No results for input(s): TROPONINI, PROBNP in the last 168 hours.  Glucose  Recent Labs Lab 06/01/15 0745 06/01/15 1201 06/01/15 1551 06/01/15 2001 06/02/15 0019 06/02/15 0331  GLUCAP 133* 117* 112* 116* 135* 118*    Imaging No results found.   STUDIES:  03/10/2015 Echo >> EF  60-65%  CULTURES: 5/05 Urine >> negative 5/09 Blood >> negative 5/09 Sputum >> MRSA 5/14 Blood >> negative  ANTIBIOTICS: 5/02 Levaquin >> 5/03 5/04 Zosyn >> 5/07 5/04 Vancomycin >> 5/17 5/07 Rocephin >> 5/09 5/09 Fortaz >> 5/16  SIGNIFICANT EVENTS: 5/05 From rehab to [facility] with respiratory failure 5/09 To ICU >> VDRF 5/18 Transfuse PRBC, change to diprivan 5/19 Add solumedrol 5/20 Transfuse PRBC  LINES/TUBES: 5/04 Right PICC >> Foley >> ETT 5/9 - 5/20  ASSESSMENT / PLAN:  PULMONARY A: Acute Hypoxic Respiratory Failure - Secondary to HCAP & possible pulmonary edema. Elevated ESR - Unclear significance.  P:  Weaning FiO2 to keep SpO2 > 92% Changing Solu-Medrol to q12hr Attempting Chair Position to minimize atelectasis  CARDIOVASCULAR A:  Shock -  Septic. Resolved.  P:  Monitor on telemetry. Vitals per unit protocol.  RENAL A: Acute Renal Failure - Resolved. Baseline creatinine 1.14 on 05/12/15. Hypernatremia - Slowly worsening.  P:   Holding further lasix Trending electrolytes & renal function daily Changing KVO IVF to 1/2NS  GASTROINTESTINAL A:   Diffuse Abdominal Pain Nutrition. Diarrhea. Dysphagia.  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 Leukocytosis - Improving. H/O sickle cell trait  P:  Trending cell counts daily with CBC Transfuse for Hb < 7 Ferrous Sulfate 365m bid Vitamin C bid Heparin [place] q8hr  INFECTIOUS A:   MRSA HCAP - completed Abx 5/17.  P:   Monitor clinically for signs of new infection No current antibiotics  ENDOCRINE A:   Hyperglycemia - BG controlled.  P:   Accu-Checks q4hr Resistent Dose SSI per algorithm  NEUROLOGIC A:   Acute Encephalopathy - Likely metabolic. Physical Deconditioning H/O Autism  P:   Klonopin 138mbid Seroquel 10066mid Precedex gtt Ativan  & Fentanyl IV prn PT/OT consulted  TODAY'S SUMMARY:  [age]o. Male with h/o autism and sickle cell trait. Previously prolonged hospitalization for PNA with ARDS s/p tracheostomy then eventually eventually decannulated and transferred to rehab.  He developed progressive hypoxia leading to respiratory failure from MRSA pneumonia. His oxygen requirement is slowly improving. I am ordering a workup for his abdominal pain. If unable to further wean FiO2 will need an NGT for medications given history of aspiration.  I have spent a total of 32 minutes of critical care time today caring for the patient, updating his mother at bedside, & reviewing his electronic medical record.  JenSonia BallersAshok Cordia.D. [facility] Pager:  [phone] 3pm or if no response, call [phone], 7:11 AM

## 2015-06-02 NOTE — Progress Notes (Signed)
eLink Physician-Brief Progress Note Patient Name: Juan RipperRobert Cameron DOB: 18-Oct-1975 MRN: [id number]   Date of Service  06/02/2015  HPI/Events of Note  Pulls off ventimask with resulting desaturation. Request for bilateral wrist restraints.  eICU Interventions  Will order bilateral wrist restraints.      Intervention Category Minor Interventions: Agitation / anxiety - evaluation and management  [name],Steven Dennard Nipugene 06/02/2015, 9:25 PM

## 2015-06-02 NOTE — Evaluation (Signed)
Occupational Therapy Evaluation Patient Details Name: Juan RipperRobert Carone MRN: [id number] DOB: 1975/12/31 Today's Date: 06/02/2015    History of Present Illness pt presents from CIR with Respiratory Failure.  pt intubated 5/9 - 5/20 this admit.  pt with multiple recent admits for respiratory difficulties with most recent admit being D/C to [facility] on 04/07/15 for prolonged weaning from vent and then to CIR for further rehab.  pt with hx of Autism, ADHD, and Sickle Cell.     Clinical Impression   Pt admitted with above. He demonstrates the below listed deficits and will benefit from continued OT to maximize safety and independence with BADLs.  Pt presents to OT with generalized weakness, decreased balance, and decreased activity tolerance.  He currently requires mod - max A for ADLs.  Recommend CIR      Follow Up Recommendations  CIR    Equipment Recommendations  None recommended by OT    Recommendations for Other Services Rehab consult     Precautions / Restrictions Precautions Precautions: Fall Precaution Comments: Watch HR and O2 sats. Restrictions Weight Bearing Restrictions: No      Mobility Bed Mobility Overal bed mobility: Needs Assistance;+2 for physical assistance Bed Mobility: Supine to Sit;Sit to Supine     Supine to sit: Mod assist;+2 for physical assistance;HOB elevated Sit to supine: Min assist;+2 for physical assistance   General bed mobility comments: pt needed cues for coming to sitting EOB and leaned posteriorly in sitting.  Question if pt needed that amount of A due to pt effort and did not know therapists.  After returning to bed therapist was attempting to adjust pads under pt and pt was able to bring himself to long sitting in bed with use of UEs.    Transfers Overall transfer level: Needs assistance Equipment used: 2 person hand held assist Transfers: Sit to/from Stand Sit to Stand: Mod assist;+2 physical assistance         General transfer comment:  pt agreeable to attempt standing, however only stood for 1-2 seconds as pt quickly returned to sitting indicating increased abdominal pain.  Unable to provide any further details and declining to attempt to stand again.      Balance Overall balance assessment: Needs assistance Sitting-balance support: Bilateral upper extremity supported Sitting balance-Leahy Scale: Poor Sitting balance - Comments: pt leans posteriorly, but at times did note pt was able to support himself.   Postural control: Posterior lean Standing balance support: During functional activity Standing balance-Leahy Scale: Zero                              ADL Overall ADL's : Needs assistance/impaired     Grooming: Wash/dry face;Wash/dry hands;Sitting;Moderate assistance   Upper Body Bathing: Maximal assistance;Sitting   Lower Body Bathing: Maximal assistance;Sit to/from stand;Bed level   Upper Body Dressing : Maximal assistance;Sitting   Lower Body Dressing: Maximal assistance;Sit to/from stand Lower Body Dressing Details (indicate cue type and reason): Pt able to assist with donning socks  Toilet Transfer: Total assistance Toilet Transfer Details (indicate cue type and reason): Pt unable to tolerate standing due to abd pain  Toileting- Clothing Manipulation and Hygiene: Total assistance;Bed level       Functional mobility during ADLs: Moderate assistance;+2 for physical assistance General ADL Comments: Pt very engaging, but limited by abdominal pain      Vision Vision Assessment?: No apparent visual deficits   Perception     Praxis  Pertinent Vitals/Pain Pain Assessment: Faces Faces Pain Scale: Hurts little more Pain Location: abdomen  Pain Descriptors / Indicators: Grimacing;Guarding Pain Intervention(s): Limited activity within patient's tolerance;Monitored during session     Hand Dominance Right   Extremity/Trunk Assessment Upper Extremity Assessment Upper Extremity  Assessment: Generalized weakness   Lower Extremity Assessment Lower Extremity Assessment: Defer to PT evaluation   Cervical / Trunk Assessment Cervical / Trunk Assessment: Normal   Communication Communication Communication: Other (comment) (Pt with venti mask in place )   Cognition Arousal/Alertness: Awake/alert Behavior During Therapy: WFL for tasks assessed/performed Overall Cognitive Status: History of cognitive impairments - at baseline                     General Comments       Exercises       Shoulder Instructions      Home Living Family/patient expects to be discharged to:: Inpatient rehab Living Arrangements: Other relatives Available Help at Discharge: Family Type of Home: House Home Access: Stairs to enter Secretary/administrator of Steps: 3 Entrance Stairs-Rails: None Home Layout: One level     Bathroom Shower/Tub: IT trainer: Standard Bathroom Accessibility: Yes How Accessible: Accessible via walker     Additional Comments: Obtained info via chart as no family present and pt unable to provide information due to baseline cognition  Lives With: Family    Prior Functioning/Environment Level of Independence: Independent (Prior to  recent hospitalizations.  )        Comments: (information taken from chart review) Loves bowling and video games    OT Diagnosis: Generalized weakness;Cognitive deficits;Acute pain   OT Problem List: Decreased strength;Decreased activity tolerance;Impaired balance (sitting and/or standing);Decreased cognition;Decreased safety awareness;Decreased knowledge of use of DME or AE;Cardiopulmonary status limiting activity;Pain   OT Treatment/Interventions: Self-care/ADL training;Therapeutic exercise;DME and/or AE instruction;Therapeutic activities;Cognitive remediation/compensation;Patient/family education;Balance training    OT Goals(Current goals can be found in the care plan section) Acute  Rehab OT Goals Patient Stated Goal: pt did not state. OT Goal Formulation: With patient Time For Goal Achievement: 06/16/15 Potential to Achieve Goals: Good ADL Goals Pt Will Perform Grooming: with min assist;sitting Pt Will Perform Upper Body Bathing: with min assist;sitting Pt Will Perform Lower Body Bathing: with min assist;sit to/from stand Pt Will Perform Upper Body Dressing: with min assist;sitting Pt Will Perform Lower Body Dressing: with min assist;sit to/from stand Pt Will Transfer to Toilet: with min assist;ambulating;regular height toilet;grab bars;bedside commode Pt Will Perform Toileting - Clothing Manipulation and hygiene: with min assist;sit to/from stand  OT Frequency: Min 3X/week   Barriers to D/C:            Co-evaluation              End of Session Equipment Utilized During Treatment: Oxygen Nurse Communication: Mobility status  Activity Tolerance: No increased pain;Patient limited by fatigue Patient left: in bed;with call bell/phone within reach;with family/visitor present   Time: 1610-9604 OT Time Calculation (min): 22 min Charges:  OT General Charges $OT Visit: 1 Procedure OT Evaluation $OT Eval Moderate Complexity: 1 Procedure G-Codes:    Jeani Hawking M 06-22-2015, 3:33 PM

## 2015-06-02 NOTE — Progress Notes (Signed)
Pt desat to 70s and c/o abd pain. RT called to NT suction and given PRN fentanyl per Dr. Vassie LollAlva verbal order. Placed pt on NRB as well.

## 2015-06-02 NOTE — Evaluation (Signed)
Physical Therapy Evaluation Patient Details Name: Alan RipperRobert Caster MRN: [id number] DOB: [birth year] Today's Date: 06/02/2015   History of Present Illness  pt presents from CIR with Respiratory Failure.  pt intubated 5/9 - 5/20 this admit.  pt with multiple recent admits for respiratory difficulties with most recent admit being D/C to [facility] on 04/07/15 for prolonged weaning from vent and then to CIR for further rehab.  pt with hx of Autism, ADHD, and Sickle Cell.    Clinical Impression  Pt very pleasant and agreeable to work with therapy, however question effort at beginning of session as pt needed increased A then, than he did to come to sit at end of session.  Feel consistency with staff my be beneficial for pt participation.  Pt's O2 and HR remained stable during session while pt on non-rebreather mask.  Pt's teacher arrived during session and was able to confirm pt's level of independence prior to hospitalizations.  Feel pt would benefit from CIR level of therapies at D/C to maximize independence and decrease overall burden of care.      Follow Up Recommendations CIR    Equipment Recommendations  None recommended by PT    Recommendations for Other Services Rehab consult     Precautions / Restrictions Precautions Precautions: Fall Precaution Comments: Watch HR and O2 sats. Restrictions Weight Bearing Restrictions: No      Mobility  Bed Mobility Overal bed mobility: Needs Assistance;+2 for physical assistance Bed Mobility: Supine to Sit;Sit to Supine     Supine to sit: Mod assist;+2 for physical assistance;HOB elevated Sit to supine: Min assist;+2 for physical assistance   General bed mobility comments: pt needed cues for coming to sitting EOB and leaned posteriorly in sitting.  Question if pt needed that amount of A due to pt effort and did not know therapists.  After returning to bed therapist was attempting to adjust pads under pt and pt was able to bring himself to long sitting in  bed with use of UEs.    Transfers Overall transfer level: Needs assistance Equipment used: 2 person hand held assist Transfers: Sit to/from Stand Sit to Stand: Mod assist;+2 physical assistance         General transfer comment: pt agreeable to attempt standing, however only stood for 1-2 seconds as pt quickly returned to sitting indicating increased abdominal pain.  Unable to provide any further details and declining to attempt to stand again.    Ambulation/Gait                Stairs            Wheelchair Mobility    Modified Rankin (Stroke Patients Only)       Balance Overall balance assessment: Needs assistance Sitting-balance support: Bilateral upper extremity supported;Feet supported Sitting balance-Leahy Scale: Poor Sitting balance - Comments: pt leans posteriorly, but at times did note pt was able to support himself.   Postural control: Posterior lean Standing balance support: During functional activity Standing balance-Leahy Scale: Zero                               Pertinent Vitals/Pain Pain Assessment: Faces Faces Pain Scale: Hurts little more Pain Location: pt indicates abdominal pain, but unable to rate or describe further.   Pain Descriptors / Indicators: Grimacing;Guarding Pain Intervention(s): Monitored during session;Repositioned (Made RN aware)    Home Living Family/patient expects to be discharged to:: Inpatient rehab  Prior Function Level of Independence: Independent (Prior to  recent hospitalizations.  )         Comments: Loves bowling and video games     Hand Dominance        Extremity/Trunk Assessment   Upper Extremity Assessment: Defer to OT evaluation           Lower Extremity Assessment: Generalized weakness      Cervical / Trunk Assessment: Normal  Communication   Communication: No difficulties (Talks quietly.)  Cognition Arousal/Alertness: Awake/alert Behavior  During Therapy: WFL for tasks assessed/performed Overall Cognitive Status: History of cognitive impairments - at baseline                      General Comments      Exercises        Assessment/Plan    PT Assessment Patient needs continued PT services  PT Diagnosis Difficulty walking;Generalized weakness;Acute pain   PT Problem List Decreased strength;Decreased activity tolerance;Decreased balance;Decreased mobility;Decreased coordination;Decreased knowledge of use of DME;Cardiopulmonary status limiting activity;Pain  PT Treatment Interventions DME instruction;Gait training;Stair training;Functional mobility training;Therapeutic activities;Therapeutic exercise;Balance training;Patient/family education   PT Goals (Current goals can be found in the Care Plan section) Acute Rehab PT Goals Patient Stated Goal: pt did not state. PT Goal Formulation: Patient unable to participate in goal setting Time For Goal Achievement: 06/16/15 Potential to Achieve Goals: Good    Frequency Min 3X/week   Barriers to discharge        Co-evaluation               End of Session Equipment Utilized During Treatment: Gait belt Activity Tolerance: Patient limited by pain Patient left: in bed;with call bell/phone within reach;with bed alarm set;with family/visitor present Nurse Communication: Mobility status;Need for lift equipment (Abdominal pain)         Time: 4259-5638 PT Time Calculation (min) (ACUTE ONLY): 22 min   Charges:   PT Evaluation $PT Eval Moderate Complexity: 1 Procedure     PT G CodesSunny Schlein, [place] [phone] 06/02/2015, 1:37 PM

## 2015-06-02 NOTE — Progress Notes (Signed)
I know pt from previous admit from [facility] to CIR 4/25/ until 5/5 when he was readmitted to acute due to medical decline. He was to be discharged home within the next week when medical issues arose. I will discuss with CIR rehab team goals and plans for rehab venue prior to discussing with pt and his sister/guardian. [phone]

## 2015-06-02 NOTE — Progress Notes (Signed)
Speech Language Pathology Treatment: Dysphagia  Patient Details Name: Juan Cameron MRN: [id number] DOB: 23-Oct-1975 Today's Date: 06/02/2015 Time: [id number] SLP Time Calculation (min) (ACUTE ONLY): 13 min  Assessment / Plan / Recommendation Clinical Impression  F/u after 5/21 swallow assessment.  Continues with high respiratory needs; on 55% venti mask; desaturating when removed.  Pt more alert today, asking to eat.  Phonation quite hoarse with low volume.  Trials of ice chips/water lead to wet-sounding phonation, cough, and RR increasing into 30s, potentially impacting compatibility with adequate laryngeal closure. Given continued respiratory issues and heightened aspiration risk, recommend proceeding with MBS next date prior to beginning diet.  D/W RN, Dr. Jamison NeighborNestor.    HPI HPI: Pt is a 40 y.o. male with PMH of autism and ADHD , sickle cell trait, independent prior to admission. Pt was admitted for PNA and discharged 03/10/15 readmitted 04/07/15 with respiratory failure requiring intubation, tracheostomy 03/20/2015; failed extubation [facility]. Pt  decannulated 04/25/2015, diet slowly advanced to a regular consistency, bouts of tachycardia atrial fibrillation converted back to normal sinus rhythm and transferred to CIR.  SLP evaluated 5/4 and was being followed for dysphagia due to deconditioning and cognitive impairment but was on regular solids/ thin liquids. Transferred back to acute venue with desaturation in the 70's. BSE completed 5/7 recommending regular diet/thin liquids; continued with respiratory insufficiency and intubated 5/9-5/20 and reorders to assess swallow function.       SLP Plan  Continue with current plan of care;MBS next date.     Recommendations  Diet recommendations: NPO Medication Administration: Via alternative means             Oral Care Recommendations: Oral care QID Plan: Continue with current plan of care;MBS      GO               Juan Abril L. Juan Cameron, Juan Cameron CCC/SLP Pager [phone]  Juan Cameron, Juan Cameron 06/02/2015, 4:20 PM

## 2015-06-03 ENCOUNTER — Inpatient Hospital Stay (HOSPITAL_COMMUNITY): Payer: Medicare Other

## 2015-06-03 DIAGNOSIS — R1310 Dysphagia, unspecified: Secondary | ICD-10-CM

## 2015-06-03 DIAGNOSIS — E870 Hyperosmolality and hypernatremia: Secondary | ICD-10-CM

## 2015-06-03 LAB — BASIC METABOLIC PANEL
ANION GAP: 7 (ref 5–15)
BUN: 27 mg/dL — ABNORMAL HIGH (ref 6–20)
CALCIUM: 8.8 mg/dL — AB (ref 8.9–10.3)
CO2: 33 mmol/L — ABNORMAL HIGH (ref 22–32)
Chloride: 110 mmol/L (ref 101–111)
Creatinine, Ser: 1.11 mg/dL (ref 0.61–1.24)
GFR calc Af Amer: >60 mL/min (ref >60)
GFR calc non Af Amer: >60 mL/min (ref >60)
GLUCOSE: 342 mg/dL — AB (ref 65–99)
Potassium: 3.4 mmol/L — ABNORMAL LOW (ref 3.5–5.1)
Sodium: 150 mmol/L — ABNORMAL HIGH (ref 135–145)

## 2015-06-03 LAB — CBC WITH DIFFERENTIAL/PLATELET
BASOS ABS: 0 10*3/uL (ref 0.0–0.1)
Basophils Relative: 0 %
EOS PCT: 0 %
Eosinophils Absolute: 0 10*3/uL (ref 0.0–0.7)
HEMATOCRIT: 30.6 % — AB (ref 39.0–52.0)
Hemoglobin: 9.4 g/dL — ABNORMAL LOW (ref 13.0–17.0)
LYMPHS ABS: 1.6 10*3/uL (ref 0.7–4.0)
LYMPHS PCT: 7 %
MCH: 26.4 pg (ref 26.0–34.0)
MCHC: 30.7 g/dL (ref 30.0–36.0)
MCV: 86 fL (ref 78.0–100.0)
MONO ABS: 2.8 10*3/uL — AB (ref 0.1–1.0)
Monocytes Relative: 13 %
NEUTROS ABS: 17.8 10*3/uL — AB (ref 1.7–7.7)
Neutrophils Relative %: 80 %
Platelets: 492 10*3/uL — ABNORMAL HIGH (ref 150–400)
RBC: 3.56 MIL/uL — AB (ref 4.22–5.81)
RDW: 17.8 % — ABNORMAL HIGH (ref 11.5–15.5)
WBC: 22.2 10*3/uL — AB (ref 4.0–10.5)

## 2015-06-03 LAB — RENAL FUNCTION PANEL
ANION GAP: 12 (ref 5–15)
Albumin: 2.8 g/dL — ABNORMAL LOW (ref 3.5–5.0)
BUN: 31 mg/dL — ABNORMAL HIGH (ref 6–20)
CHLORIDE: 112 mmol/L — AB (ref 101–111)
CO2: 32 mmol/L (ref 22–32)
CREATININE: 1.07 mg/dL (ref 0.61–1.24)
Calcium: 9.5 mg/dL (ref 8.9–10.3)
Glucose, Bld: 123 mg/dL — ABNORMAL HIGH (ref 65–99)
POTASSIUM: 3.3 mmol/L — AB (ref 3.5–5.1)
Phosphorus: 3.9 mg/dL (ref 2.5–4.6)
Sodium: 156 mmol/L — ABNORMAL HIGH (ref 135–145)

## 2015-06-03 LAB — GLUCOSE, CAPILLARY
GLUCOSE-CAPILLARY: 101 mg/dL — AB (ref 65–99)
GLUCOSE-CAPILLARY: 134 mg/dL — AB (ref 65–99)
Glucose-Capillary: 119 mg/dL — ABNORMAL HIGH (ref 65–99)
Glucose-Capillary: 116 mg/dL — ABNORMAL HIGH (ref 65–99)
Glucose-Capillary: 140 mg/dL — ABNORMAL HIGH (ref 65–99)

## 2015-06-03 LAB — MAGNESIUM: MAGNESIUM: 2.2 mg/dL (ref 1.7–2.4)

## 2015-06-03 MED ORDER — RESOURCE THICKENUP CLEAR PO POWD
ORAL | Status: DC | PRN
  Filled 2015-06-03: qty 125

## 2015-06-03 MED ORDER — ACETAMINOPHEN 650 MG RE SUPP
650.0000 mg | Freq: Four times a day (QID) | RECTAL | Status: DC | PRN
  Administered 2015-06-03 – 2015-06-04 (×2): 650 mg via RECTAL
  Filled 2015-06-03 (×2): qty 1

## 2015-06-03 MED ORDER — DEXTROSE-NACL 5-0.45 % IV SOLN: INTRAVENOUS | Status: DC

## 2015-06-03 MED ORDER — POTASSIUM CHLORIDE 10 MEQ/50ML IV SOLN
10.0000 meq | INTRAVENOUS | Status: AC
  Administered 2015-06-03 (×2): 10 meq via INTRAVENOUS
  Filled 2015-06-03 (×2): qty 50

## 2015-06-03 MED ORDER — DEXTROSE-NACL 5-0.2 % IV SOLN
INTRAVENOUS | Status: DC
  Administered 2015-06-03 – 2015-06-06 (×12): via INTRAVENOUS

## 2015-06-03 NOTE — Progress Notes (Signed)
Wasted 1.4575mL of fentanyl in sink with Schuyler Amorarla Fulk RN

## 2015-06-03 NOTE — Progress Notes (Signed)
PULMONARY / CRITICAL CARE MEDICINE   Name: Juan Cameron MRN: [id number] DOB: 09-23-1975    ADMISSION DATE: 05/16/2015  REFERRING MD:  Rehab MD  CHIEF COMPLAINT: Respiratory Distress/ Hypoxemia  SUBJECTIVE:  No acute events overnight. Patient refusing to let face mask in place and soft hand restraints were ordered. Scheduled for MBS today. Patient still c/o abdominal discomfort but is also hungry. Denies any chest pain or problems breathing.  REVIEW OF SYSTEMS:  Unable to obtain given mental state.  VITAL SIGNS: BP 170/98 mmHg  Pulse 99  Temp(Src) 99.2 F (37.3 C) (Oral)  Resp 26  Ht 5' 9"  (1.753 m)  Wt 195 lb 5.2 oz (88.6 kg)  BMI 28.83 kg/m2  SpO2 94%  VENT SETTINGS: Vent Mode:  [-]  FiO2 (%):  [55 %] 55 %  INTAKE / OUTPUT: I/O last 3 completed shifts: In: 613.7 [I.V.:383.7; Other:180; IV Piggyback:50] Out: 2880 [Urine:2880]  PHYSICAL EXAMINATION: General:  Awake. No distress. No family at bedside.  Integument:  Warm & dry. No rash on exposed skin.  HEENT:  No scleral injection or icterus. PERRL. Cardiovascular:  Regular rhythm. No edema. No appreciable JVD.  Pulmonary:  Crackles bilateral lung bases. Normal WOB on Venti mask.  Abdomen: Soft. Normal bowel sounds. Nondistended.  Neurological:  Follows commands. Doesn't routinely answer questions. Averts eyes at times during questioning at times.  LABS:  BMET  Recent Labs Lab 06/01/15 0333 06/02/15 0300 06/03/15 0350  NA 147* 152* 156*  K 3.6 3.6 3.3*  CL 105 111 112*  CO2 29 34* 32  BUN 31* 34* 31*  CREATININE 1.08 1.19 1.07  GLUCOSE 125* 130* 123*   Electrolytes  Recent Labs Lab 06/01/15 0333 06/02/15 0300 06/03/15 0350  CALCIUM 9.5 9.5 9.5  MG  --   --  2.2  PHOS  --   --  3.9   CBC  Recent Labs Lab 06/01/15 0333 06/02/15 0300 06/03/15 0350  WBC 29.8* 20.3* 22.2*  HGB 9.3* 9.2* 9.4*  HCT 29.8* 29.4* 30.6*  PLT 572* 521* 492*   Coag's No results for input(s): APTT, INR in the last  168 hours.  Sepsis Markers No results for input(s): LATICACIDVEN, PROCALCITON, O2SATVEN in the last 168 hours. ABG No results for input(s): PHART, PCO2ART, PO2ART in the last 168 hours. Liver Enzymes  Recent Labs Lab 06/02/15 1345 06/03/15 0350  AST 27  --   ALT 23  --   ALKPHOS 43  --   BILITOT 0.6  --   ALBUMIN 2.5* 2.8*    Cardiac Enzymes No results for input(s): TROPONINI, PROBNP in the last 168 hours.  Glucose  Recent Labs Lab 06/02/15 0804 06/02/15 1108 06/02/15 1610 06/02/15 2003 06/03/15 0028 06/03/15 0345  GLUCAP 135* 130* 128* 109* 101* 119*    Imaging US Abdomen Complete  06/02/2015  CLINICAL DATA:  Abdominal pain EXAM: ABDOMEN ULTRASOUND COMPLETE COMPARISON:  None FINDINGS: Gallbladder: Normally distended without stones or wall thickening. No pericholecystic fluid or sonographic Murphy sign. Common bile duct: Diameter: Normal caliber 5 mm diameter Liver: New normal appearance IVC: Normal appearance Pancreas: Obscured by bowel gas Spleen: Normal appearance, 7.0 cm length Right Kidney: Length: 10.8 cm. Normal morphology without mass or hydronephrosis. Left Kidney: Length: 11.9 cm. Normal morphology without mass or hydronephrosis. Abdominal aorta: Normal caliber Other findings: No free fluid IMPRESSION: Nonvisualization of pancreas, obscured by bowel gas. Remainder of exam normal. Electronically Signed   By: Lavonia Dana M.D.   On: 06/02/2015 15:29  STUDIES:  03/10/2015 Echo >> EF  60-65% 5/22 Abd [place]: No free fluid or hydronephrosis. Normal BG. Pancreas not well visualized.  CULTURES: 5/05 Urine >> negative 5/09 Blood >> negative 5/09 Sputum >> MRSA 5/14 Blood >> negative  ANTIBIOTICS: 5/02 Levaquin >> 5/03 5/04 Zosyn >> 5/07 5/04 Vancomycin >> 5/17 5/07 Rocephin >> 5/09 5/09 Fortaz >> 5/16  SIGNIFICANT EVENTS: 5/05 From rehab to [facility] with respiratory failure 5/09 To ICU >> VDRF 5/18 Transfuse PRBC, change to diprivan 5/19 Add solumedrol 5/20  Transfuse PRBC  LINES/TUBES: 5/04 Right PICC >> Foley >> ETT 5/9 - 5/20  ASSESSMENT / PLAN:  PULMONARY A: Acute Hypoxic Respiratory Failure - Secondary to HCAP & possible pulmonary edema. Elevated ESR - Unclear significance.  P:  Weaning FiO2 to keep SpO2 > 92% Continuing Solu-Medrol to q12hr Attempting Chair Position to minimize atelectasis  CARDIOVASCULAR A:  Shock - Septic. Resolved.  P:  Monitor on telemetry. Vitals per unit protocol.  RENAL A: Acute Renal Failure - Resolved. Baseline creatinine 1.14 on 05/12/15. Hypernatremia -  Worsening.  P:   Holding further lasix Trending electrolytes & renal function daily D5 1/4 NS at 100cc/hr Repeat BMP at 1500 today  GASTROINTESTINAL A:   Diffuse Abdominal Pain Nutrition. Diarrhea. Dysphagia.  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 Hgb Stable. Leukocytosis - Stable. H/O sickle cell trait  P:  Trending cell counts daily with CBC Transfuse for Hb < 7 Ferrous Sulfate 379m bid Vitamin C bid Heparin [place] q8hr  INFECTIOUS A:   MRSA HCAP - Completed Abx 5/17.  P:   Monitor clinically for signs of new infection No current antibiotics Plan to re-culture for fever >100.64F  ENDOCRINE A:   Hyperglycemia - BG controlled.  P:   Accu-Checks q4hr Resistent Dose SSI per algorithm  NEUROLOGIC A:   Acute Encephalopathy - Likely metabolic. Physical Deconditioning H/O Autism  P:   Klonopin 177mbid Seroquel 10037mid Precedex gtt now off Ativan  & Fentanyl IV prn PT/OT consulted  TODAY'S SUMMARY:  [age]o. Male with h/o autism and sickle cell trait. Previously prolonged hospitalization for PNA with ARDS s/p tracheostomy then eventually eventually decannulated and transferred to rehab.  He developed progressive hypoxia leading to respiratory failure from MRSA pneumonia. His oxygen requirement is  slowly improving. I am ordering a workup for his abdominal pain. If unable to further wean FiO2 will need an NGT for medications given history of aspiration.  I have spent a total of 34 minutes of critical care time today caring for the patient & reviewing his electronic medical record.  JenSonia BallersAshok Cordia.D. [facility] Pager:  [phone] 3pm or if no response, call [phone]/2017, 8:48 AM

## 2015-06-03 NOTE — Progress Notes (Signed)
MD renewed restraint order for agitation as pt continues to pull off venti mask and drops O2 level into the 70s. Pt does not need bilateral wrist restraints at this time as sitter is in room and helps remind patient to keep mask on. Will continue to monitor need for restraints and chart accordingly

## 2015-06-03 NOTE — Progress Notes (Signed)
Nutrition Follow-up  DOCUMENTATION CODES:   Not applicable  INTERVENTION:    Magic cup TID with meals, each supplement provides 290 kcal and 9 grams of protein  NUTRITION DIAGNOSIS:   Inadequate oral intake related to inability to eat as evidenced by NPO status.  Ongoing, diet advancing today  GOAL:   Patient will meet greater than or equal to 90% of their needs  Unmet  MONITOR:   PO intake, Supplement acceptance, Labs, Weight trends, I & O's  ASSESSMENT:   40 y/o M with PMH of sickle cell trait, PNA and autism with recent prolonged hospitalization for multilobar PNA with ARDS, AKI requiring HD (resolved, off HD), prolonged respiratory failure s/p tracheostomy. [facility]. At [facility], he was He subsequently was decannulated and [facility].Am of 5/5 2017 pt developed progressive dry cough with acute desaturation into the 70's on 4L min [place], transferred to SDU.   Patient was extubated on 5/20. Mental status is altered at times. S/P swallow evaluation with SLP today, RN reports diet to be advanced to a dysphagia diet with nectar thick liquids today. Suspect intake will be inadequate, given patient's increased needs, AMS, recent prolonged intubation, and dysphagia. Will add Magic Cups with all meals.  Diet Order:  Diet NPO time specified  Skin:  Reviewed, no issues  Last BM:  5/21  Height:   Ht Readings from Last 1 Encounters:  05/20/15 5\' 9"  (1.753 m)    Weight:   Wt Readings from Last 1 Encounters:  06/03/15 195 lb 5.2 oz (88.6 kg)    Ideal Body Weight:  72.7 kg  BMI:  Body mass index is 28.83 kg/(m^2).  Estimated Nutritional Needs:   Kcal:  2100-2300  Protein:  115-130 gm  Fluid:  2.1-2.3 L  EDUCATION NEEDS:   No education needs identified at this time  Joaquin CourtsKimberly Rilla Buckman, RD, LDN, CNSC Pager [phone] After Hours Pager [phone]

## 2015-06-03 NOTE — Progress Notes (Signed)
[facility] FOR AM LAB REPLACEMENT ONLY  The patient does apply for the [facility] Electrolyte Replacment Protocol based on the criteria listed below:   1. Is GFR >/= 40 ml/min? Yes.    Patient's GFR today is >60 2. Is urine output >/= 0.5 ml/kg/hr for the last 6 hours? Yes.   Patient's UOP is 0.9 ml/kg/hr 3. Is BUN < 60 mg/dL? Yes.    Patient's BUN today is 31 4. Abnormal electrolyte(s): K 3.3 5. Ordered repletion with: per pro tocol 6. If a panic level lab has been reported, has the CCM MD in charge been notified? No..   Physician:    Markus DaftWHELAN, Frederika Hukill A 06/03/2015 5:13 AM

## 2015-06-03 NOTE — Progress Notes (Signed)
MBSS complete. Full report located under chart review in imaging section. Analy Bassford, MA CCC-SLP [phone]  

## 2015-06-04 LAB — CBC WITH DIFFERENTIAL/PLATELET
BASOS ABS: 0 10*3/uL (ref 0.0–0.1)
BASOS PCT: 0 %
Eosinophils Absolute: 0 10*3/uL (ref 0.0–0.7)
Eosinophils Relative: 0 %
HEMATOCRIT: 32.2 % — AB (ref 39.0–52.0)
HEMOGLOBIN: 9.6 g/dL — AB (ref 13.0–17.0)
Lymphocytes Relative: 8 %
Lymphs Abs: 1.5 10*3/uL (ref 0.7–4.0)
MCH: 25.9 pg — ABNORMAL LOW (ref 26.0–34.0)
MCHC: 29.8 g/dL — ABNORMAL LOW (ref 30.0–36.0)
MCV: 87 fL (ref 78.0–100.0)
MONO ABS: 2.2 10*3/uL — AB (ref 0.1–1.0)
Monocytes Relative: 12 %
NEUTROS ABS: 15 10*3/uL — AB (ref 1.7–7.7)
NEUTROS PCT: 80 %
Platelets: 366 10*3/uL (ref 150–400)
RBC: 3.7 MIL/uL — AB (ref 4.22–5.81)
RDW: 18.1 % — AB (ref 11.5–15.5)
WBC: 18.7 10*3/uL — AB (ref 4.0–10.5)

## 2015-06-04 LAB — RENAL FUNCTION PANEL
ALBUMIN: 2.7 g/dL — AB (ref 3.5–5.0)
ANION GAP: 7 (ref 5–15)
BUN: 25 mg/dL — ABNORMAL HIGH (ref 6–20)
CHLORIDE: 113 mmol/L — AB (ref 101–111)
CO2: 36 mmol/L — ABNORMAL HIGH (ref 22–32)
Calcium: 9.1 mg/dL (ref 8.9–10.3)
Creatinine, Ser: 0.96 mg/dL (ref 0.61–1.24)
Glucose, Bld: 131 mg/dL — ABNORMAL HIGH (ref 65–99)
PHOSPHORUS: 3 mg/dL (ref 2.5–4.6)
POTASSIUM: 3.3 mmol/L — AB (ref 3.5–5.1)
Sodium: 156 mmol/L — ABNORMAL HIGH (ref 135–145)

## 2015-06-04 LAB — BASIC METABOLIC PANEL
ANION GAP: 8 (ref 5–15)
BUN: 20 mg/dL (ref 6–20)
CALCIUM: 9 mg/dL (ref 8.9–10.3)
CO2: 34 mmol/L — ABNORMAL HIGH (ref 22–32)
Chloride: 109 mmol/L (ref 101–111)
Creatinine, Ser: 0.99 mg/dL (ref 0.61–1.24)
Glucose, Bld: 168 mg/dL — ABNORMAL HIGH (ref 65–99)
POTASSIUM: 3.3 mmol/L — AB (ref 3.5–5.1)
SODIUM: 151 mmol/L — AB (ref 135–145)

## 2015-06-04 LAB — GLUCOSE, CAPILLARY
GLUCOSE-CAPILLARY: 100 mg/dL — AB (ref 65–99)
GLUCOSE-CAPILLARY: 128 mg/dL — AB (ref 65–99)
GLUCOSE-CAPILLARY: 100 mg/dL — AB (ref 65–99)
GLUCOSE-CAPILLARY: 111 mg/dL — AB (ref 65–99)
Glucose-Capillary: 132 mg/dL — ABNORMAL HIGH (ref 65–99)
Glucose-Capillary: 97 mg/dL (ref 65–99)

## 2015-06-04 LAB — OSMOLALITY, URINE: OSMOLALITY UR: 453 mosm/kg (ref 300–900)

## 2015-06-04 LAB — OSMOLALITY: OSMOLALITY: 329 mosm/kg — AB (ref 275–295)

## 2015-06-04 LAB — MAGNESIUM: MAGNESIUM: 2 mg/dL (ref 1.7–2.4)

## 2015-06-04 MED ORDER — POTASSIUM CHLORIDE 10 MEQ/50ML IV SOLN
10.0000 meq | INTRAVENOUS | Status: AC
  Administered 2015-06-04 (×2): 10 meq via INTRAVENOUS
  Filled 2015-06-04 (×2): qty 50

## 2015-06-04 MED ORDER — PANTOPRAZOLE SODIUM 40 MG PO TBEC: 40.0000 mg | DELAYED_RELEASE_TABLET | Freq: Every day | ORAL | Status: DC

## 2015-06-04 MED ORDER — VITAMIN C 500 MG PO TABS
250.0000 mg | ORAL_TABLET | Freq: Two times a day (BID) | ORAL | Status: DC
  Administered 2015-06-06 – 2015-06-10 (×9): 250 mg via ORAL
  Filled 2015-06-04 (×13): qty 1

## 2015-06-04 MED ORDER — FERROUS SULFATE 325 (65 FE) MG PO TABS
325.0000 mg | ORAL_TABLET | Freq: Two times a day (BID) | ORAL | Status: DC
  Administered 2015-06-06 – 2015-06-10 (×9): 325 mg via ORAL
  Filled 2015-06-04 (×13): qty 1

## 2015-06-04 MED ORDER — QUETIAPINE FUMARATE 100 MG PO TABS
100.0000 mg | ORAL_TABLET | Freq: Two times a day (BID) | ORAL | Status: DC
  Filled 2015-06-04 (×3): qty 1

## 2015-06-04 MED ORDER — INSULIN ASPART 100 UNIT/ML ~~LOC~~ SOLN
0.0000 [IU] | Freq: Three times a day (TID) | SUBCUTANEOUS | Status: DC
  Administered 2015-06-08 – 2015-06-10 (×4): 3 [IU] via SUBCUTANEOUS

## 2015-06-04 MED ORDER — LORAZEPAM 1 MG PO TABS: 1.0000 mg | ORAL_TABLET | ORAL | Status: DC | PRN

## 2015-06-04 MED ORDER — CLONAZEPAM 0.5 MG PO TABS
0.5000 mg | ORAL_TABLET | Freq: Two times a day (BID) | ORAL | Status: DC
  Administered 2015-06-05 – 2015-06-06 (×2): 0.5 mg via ORAL
  Filled 2015-06-04 (×3): qty 1

## 2015-06-04 MED ORDER — LORAZEPAM 2 MG/ML IJ SOLN: 0.5000 mg | INTRAMUSCULAR | Status: DC | PRN

## 2015-06-04 MED ORDER — METHYLPREDNISOLONE SODIUM SUCC 40 MG IJ SOLR
40.0000 mg | Freq: Every day | INTRAMUSCULAR | Status: DC
  Administered 2015-06-04 – 2015-06-08 (×5): 40 mg via INTRAVENOUS
  Filled 2015-06-04 (×5): qty 1

## 2015-06-04 MED ORDER — PHENOL 1.4 % MT LIQD
1.0000 | OROMUCOSAL | Status: DC | PRN
  Filled 2015-06-04: qty 177

## 2015-06-04 MED ORDER — PREDNISONE 20 MG PO TABS
40.0000 mg | ORAL_TABLET | Freq: Every day | ORAL | Status: DC
  Filled 2015-06-04: qty 2

## 2015-06-04 MED ORDER — INSULIN ASPART 100 UNIT/ML ~~LOC~~ SOLN: 0.0000 [IU] | Freq: Every day | SUBCUTANEOUS | Status: DC

## 2015-06-04 MED ORDER — ACETAMINOPHEN 160 MG/5ML PO SOLN
650.0000 mg | Freq: Four times a day (QID) | ORAL | Status: DC | PRN
  Administered 2015-06-08: 650 mg via ORAL
  Filled 2015-06-04: qty 20.3

## 2015-06-04 MED ORDER — PANTOPRAZOLE SODIUM 40 MG IV SOLR
40.0000 mg | INTRAVENOUS | Status: DC
  Administered 2015-06-04 – 2015-06-06 (×3): 40 mg via INTRAVENOUS
  Filled 2015-06-04 (×4): qty 40

## 2015-06-04 NOTE — Progress Notes (Addendum)
PULMONARY / CRITICAL CARE MEDICINE   Name: Juan Cameron MRN: [id number] DOB: 08/22/1975    ADMISSION DATE: 05/16/2015  REFERRING MD:  Rehab MD  CHIEF COMPLAINT: Respiratory Distress/ Hypoxemia  SUBJECTIVE:  No acute events overnight. Patient refusing to eat and spitting out food even when given by his sister. Won't answer if he is having any pain or trouble breathing.  REVIEW OF SYSTEMS:  Unable to obtain given mental state.  VITAL SIGNS: BP 155/91 mmHg  Pulse 103  Temp(Src) 100.4 F (38 C) (Oral)  Resp 21  Ht 5' 9"  (1.753 m)  Wt 188 lb 4.4 oz (85.4 kg)  BMI 27.79 kg/m2  SpO2 100%  VENT SETTINGS: Vent Mode:  [-]  FiO2 (%):  [45 %] 45 %  INTAKE / OUTPUT: I/O last 3 completed shifts: In: 1281.7 [I.V.:1171.7; Other:60; IV Piggyback:50] Out: 6314 [HFWYO:3785]  PHYSICAL EXAMINATION: General:  Awake. No acute distress. Sitter at bedside.  Integument:  Warm & dry. No rash on exposed skin.  HEENT:  No scleral injection. PERRL.  Cardiovascular:  Regular rhythm but slightly tachycardic. No edema. No appreciable JVD.  Pulmonary:  Crackles bilateral lung bases unchanged. Normal WOB on nasal cannula oxygen.  Abdomen: Soft. Normal bowel sounds. Nontender. Nondistended.  Neurological:  Follows commands. Doesn't routinely answer questions. Averts eyes at times during questioning at times.  LABS:  BMET  Recent Labs Lab 06/03/15 0350 06/03/15 1504 06/04/15 0414  NA 156* 150* 156*  K 3.3* 3.4* 3.3*  CL 112* 110 113*  CO2 32 33* 36*  BUN 31* 27* 25*  CREATININE 1.07 1.11 0.96  GLUCOSE 123* 342* 131*   Electrolytes  Recent Labs Lab 06/03/15 0350 06/03/15 1504 06/04/15 0414  CALCIUM 9.5 8.8* 9.1  MG 2.2  --  2.0  PHOS 3.9  --  3.0   CBC  Recent Labs Lab 06/02/15 0300 06/03/15 0350 06/04/15 0414  WBC 20.3* 22.2* 18.7*  HGB 9.2* 9.4* 9.6*  HCT 29.4* 30.6* 32.2*  PLT 521* 492* 366   Coag's No results for input(s): APTT, INR in the last 168 hours.  Sepsis  Markers No results for input(s): LATICACIDVEN, PROCALCITON, O2SATVEN in the last 168 hours. ABG No results for input(s): PHART, PCO2ART, PO2ART in the last 168 hours. Liver Enzymes  Recent Labs Lab 06/02/15 1345 06/03/15 0350 06/04/15 0414  AST 27  --   --   ALT 23  --   --   ALKPHOS 43  --   --   BILITOT 0.6  --   --   ALBUMIN 2.5* 2.8* 2.7*    Cardiac Enzymes No results for input(s): TROPONINI, PROBNP in the last 168 hours.  Glucose  Recent Labs Lab 06/03/15 0811 06/03/15 1145 06/03/15 1512 06/03/15 2036 06/03/15 2339 06/04/15 0410  GLUCAP 100* 116* 140* 134* 132* 128*    Imaging Dg Swallowing Func-speech Pathology  06/03/2015  Objective Swallowing Evaluation: Type of Study: MBS-Modified Barium Swallow Study Patient Details Name: Juan Cameron MRN: [id number] Date of Birth: July 02, 1975 Today's Date: 06/03/2015 Time: SLP Start Time (ACUTE ONLY): 1343-SLP Stop Time (ACUTE ONLY): 1410 SLP Time Calculation (min) (ACUTE ONLY): 27 min Past Medical History: Past Medical History Diagnosis Date . Autism  . Sickle cell trait ([place])  . Pneumonia 02/2015 . Autism  Past Surgical History: Past Surgical History Procedure Laterality Date . No past surgeries   HPI: Pt is a 40 y.o. male with PMH of autism and ADHD , sickle cell trait, independent prior to admission. Pt was admitted for  PNA and discharged 03/10/15 readmitted 04/07/15 with respiratory failure requiring intubation, tracheostomy 03/20/2015; failed extubation [facility]. Pt  decannulated 04/25/2015, diet slowly advanced to a regular consistency, bouts of tachycardia atrial fibrillation converted back to normal sinus rhythm and transferred to CIR.  SLP evaluated 5/4 and was being followed for dysphagia due to deconditioning and cognitive impairment but was on regular solids/ thin liquids. Transferred back to acute venue with desaturation in the 70's. BSE completed 5/7 recommending regular diet/thin  liquids; continued with respiratory insufficiency and intubated 5/9-5/20 and reorders to assess swallow function.  Subjective: pt alert, pleasant mood Assessment / Plan / Recommendation CHL IP CLINICAL IMPRESSIONS 06/03/2015 Therapy Diagnosis Mild pharyngeal phase dysphagia Clinical Impression MBS attempted, but limited trials due to poor participation (oral holding) lead to inconclusive result. Pt consumed two sips of nectar thick liquids and one sip of thin with an observable delay in swallow, but no penetration or aspiration. Strength WNL. Pt likely to tolerate thin liquids with precautions, but further subjective observation warranted. Unable to determine if pt senses aspiration, since it did not occur. Pt recommended to consume nectar thick liquids and puree today. Will f/u tomorrow for diet tolerance and potential upgrade.  Impact on safety and function --   CHL IP TREATMENT RECOMMENDATION 06/03/2015 Treatment Recommendations Therapy as outlined in treatment plan below   Prognosis 06/01/2015 Prognosis for Safe Diet Advancement Good Barriers to Reach Goals Cognitive deficits;Time post onset;Severity of deficits Barriers/Prognosis Comment -- CHL IP DIET RECOMMENDATION 06/03/2015 SLP Diet Recommendations Nectar thick liquid;Dysphagia 1 (Puree) solids Liquid Administration via Cup;Straw Medication Administration Crushed with puree Compensations -- Postural Changes --   CHL IP OTHER RECOMMENDATIONS 06/03/2015 Recommended Consults -- Oral Care Recommendations Oral care BID Other Recommendations --   CHL IP FOLLOW UP RECOMMENDATIONS 06/03/2015 Follow up Recommendations Inpatient Rehab   CHL IP FREQUENCY AND DURATION 06/03/2015 Speech Therapy Frequency (ACUTE ONLY) min 2x/week Treatment Duration 2 weeks      CHL IP ORAL PHASE 06/03/2015 Oral Phase Impaired Oral - Pudding Teaspoon -- Oral - Pudding Cup -- Oral - Honey Teaspoon -- Oral - Honey Cup -- Oral - Nectar Teaspoon -- Oral - Nectar Cup -- Oral - Nectar Straw Holding of  bolus Oral - Thin Teaspoon -- Oral - Thin Cup -- Oral - Thin Straw Holding of bolus Oral - Puree -- Oral - Mech Soft -- Oral - Regular -- Oral - Multi-Consistency -- Oral - Pill -- Oral Phase - Comment --  CHL IP PHARYNGEAL PHASE 06/03/2015 Pharyngeal Phase Impaired Pharyngeal- Pudding Teaspoon -- Pharyngeal -- Pharyngeal- Pudding Cup -- Pharyngeal -- Pharyngeal- Honey Teaspoon -- Pharyngeal -- Pharyngeal- Honey Cup -- Pharyngeal -- Pharyngeal- Nectar Teaspoon -- Pharyngeal -- Pharyngeal- Nectar Cup -- Pharyngeal -- Pharyngeal- Nectar Straw Delayed swallow initiation-pyriform sinuses Pharyngeal -- Pharyngeal- Thin Teaspoon -- Pharyngeal -- Pharyngeal- Thin Cup -- Pharyngeal -- Pharyngeal- Thin Straw Delayed swallow initiation-pyriform sinuses Pharyngeal -- Pharyngeal- Puree -- Pharyngeal -- Pharyngeal- Mechanical Soft -- Pharyngeal -- Pharyngeal- Regular -- Pharyngeal -- Pharyngeal- Multi-consistency -- Pharyngeal -- Pharyngeal- Pill -- Pharyngeal -- Pharyngeal Comment --  No flowsheet data found. No flowsheet data found. Herbie [place], [place] [phone] Lynann Beaver 06/03/2015, 2:49 PM                STUDIES:  03/10/2015 Echo >> EF  60-65% 5/22 Abd [place]: No free fluid or hydronephrosis. Normal BG. Pancreas not well visualized.  CULTURES: 5/05 Urine >> negative 5/09 Blood >>  negative 5/09 Sputum >> MRSA 5/14 Blood >> negative  ANTIBIOTICS: 5/02 Levaquin >> 5/03 5/04 Zosyn >> 5/07 5/04 Vancomycin >> 5/17 5/07 Rocephin >> 5/09 5/09 Fortaz >> 5/16  SIGNIFICANT EVENTS: 5/05 From rehab to [facility] with respiratory failure 5/09 To ICU >> VDRF 5/18 Transfuse PRBC, change to diprivan 5/19 Add solumedrol 5/20 Transfuse PRBC  LINES/TUBES: 5/04 Right PICC >> Foley >> ETT 5/9 - 5/20  ASSESSMENT / PLAN:  PULMONARY A: Acute Hypoxic Respiratory Failure - Secondary to HCAP & possible pulmonary edema. Elevated ESR - Unclear significance.  P:  Weaning FiO2 to keep SpO2 > 92% Switching to  Solu-Medrol 10m IV daily Chair Position to minimize atelectasis  CARDIOVASCULAR A:  Shock - Septic. Resolved.  P:  Monitor on telemetry. Vitals per unit protocol.  RENAL A: Acute Renal Failure - Resolved. Baseline creatinine 1.14 on 05/12/15. Hypernatremia -  Fluctuating.  P:   Holding further lasix Trending electrolytes & renal function daily D5 1/4 NS at 100cc/hr Repeat BMP at 1500 today Checking Serum & Urine Osm today  GASTROINTESTINAL A:   Diffuse Abdominal Pain Nutrition. Diarrhea. Dysphagia.  P:   Dysphagia 1 Diet after MBS on 5/23 May need PEG tube if continues to refuse to swallow Protonix IV daily  HEMATOLOGIC A:   Anemia - Chronic critical illness & iron deficiency. Hgb Stable. Leukocytosis - Stable. H/O sickle cell trait  P:  Trending cell counts daily with CBC Transfuse for Hb < 7 Ferrous Sulfate 3039mbid Vitamin C bid Heparin [place] q8hr  INFECTIOUS A:   MRSA HCAP - Completed Abx 5/17.  P:   Monitor clinically for signs of new infection No current antibiotics Plan to re-culture for fever >100.31F  ENDOCRINE A:   Hyperglycemia - BG controlled.  P:   Accu-Checks q4hr Resistent Dose SSI per algorithm  NEUROLOGIC A:   Acute Encephalopathy - Likely metabolic. Physical Deconditioning H/O Autism  P:   Klonopin 59m62mid PO Seroquel 100m75md PO Ativan  IV prn PT/OT consulted  FAMILY UPDATES:  Sister updated via phone 5/24 by Dr. NestAshok CordiaODAY'S SUMMARY:  40 y21. Male with h/o autism and sickle cell trait. Previously prolonged hospitalization for PNA with ARDS s/p tracheostomy then eventually eventually decannulated and transferred to rehab.  He developed progressive hypoxia leading to respiratory failure from MRSA pneumonia. His oxygen requirement is slowly improving. Sister to work with patient on increasing oral intake. If patient continues to few refuse to swallow he will need either NG tube or PEG tube placement and I did communicate  this to her. Continuing to trend electrolytes with hypernatremia.  JennSonia BallertAshok CordiaD. [facility] & Critical Care Pager:  [phone] 3pm or if no response, call [phone]/2017, 6:36 AM

## 2015-06-04 NOTE — Progress Notes (Signed)
Patient did not eat breakfast turned head and spit the food out of mouth. Dr Jamison NeighborNestor notified

## 2015-06-04 NOTE — Progress Notes (Signed)
Physical Therapy Treatment Patient Details Name: Juan RipperRobert Cameron MRN: [id number] DOB: 1975-12-24 Today's Date: 06/04/2015    History of Present Illness pt presents from CIR with Respiratory Failure.  pt intubated 5/9 - 5/20 this admit.  pt with multiple recent admits for respiratory difficulties with most recent admit being D/C to [facility] on 04/07/15 for prolonged weaning from vent and then to CIR for further rehab.  pt with hx of Autism, ADHD, and Sickle Cell.      PT Comments    Pt admitted with above diagnosis. Pt currently with functional limitations due to balance and endurance deficits. Pt was able to sit EOB and stand pivot to chair with max assist of 2 persons.  Pt does not consistently follow commands.   Pt will benefit from skilled PT to increase their independence and safety with mobility to allow discharge to the venue listed below.    Follow Up Recommendations  CIR     Equipment Recommendations  None recommended by PT    Recommendations for Other Services       Precautions / Restrictions Precautions Precautions: Fall Precaution Comments: Watch HR and O2 sats. Restrictions Weight Bearing Restrictions: No    Mobility  Bed Mobility Overal bed mobility: Needs Assistance;+2 for physical assistance Bed Mobility: Supine to Sit     Supine to sit: Mod assist;+2 for physical assistance;HOB elevated     General bed mobility comments: pt needed cues for coming to sitting EOB and leaned posteriorly in sitting.  Question if pt needed that amount of A due to pt effort and did not know therapists.   Transfers Overall transfer level: Needs assistance Equipment used: 2 person hand held assist Transfers: Sit to/from UGI CorporationStand;Stand Pivot Transfers Sit to Stand: Max assist;+2 physical assistance Stand pivot transfers: Max assist;+2 physical assistance       General transfer comment: Pt stood with +2 max assist with weight bearing but little assist given to stand and pivot to chair.   PT assisted maximally with transfer.   Ambulation/Gait                 Stairs            Wheelchair Mobility    Modified Rankin (Stroke Patients Only)       Balance Overall balance assessment: Needs assistance Sitting-balance support: Bilateral upper extremity supported;Feet supported Sitting balance-Leahy Scale: Poor Sitting balance - Comments: pt leans posteriorly, but at times did note pt was able to support himself.   Postural control: Posterior lean Standing balance support: Bilateral upper extremity supported;During functional activity Standing balance-Leahy Scale: Zero Standing balance comment: Pt requires max assist to stand statically with 2 person assist and unable to achieve full upright position.                    Cognition Arousal/Alertness: Awake/alert Behavior During Therapy: WFL for tasks assessed/performed Overall Cognitive Status: History of cognitive impairments - at baseline                      Exercises      General Comments General comments (skin integrity, edema, etc.): Tried to get pt to perform UE and LE exercises but pt not following commands.  Could move a body part for him and he would hold it up but pt would not move voluntarily.      Pertinent Vitals/Pain Pain Assessment: No/denies pain  VSS    Home Living  Prior Function            PT Goals (current goals can now be found in the care plan section) Progress towards PT goals: Progressing toward goals    Frequency  Min 3X/week    PT Plan Current plan remains appropriate    Co-evaluation             End of Session Equipment Utilized During Treatment: Gait belt;Oxygen Activity Tolerance: Patient limited by fatigue Patient left: in chair;with call bell/phone within reach;with chair alarm set;with nursing/sitter in room;with restraints reapplied     Time: 1003-1027 PT Time Calculation (min) (ACUTE ONLY): 24  min  Charges:  $Therapeutic Activity: 23-37 mins                    G CodesBerline Cameron 06-14-15, 2:14 PM Juan Cameron,PT Acute Rehabilitation [phone] (pager)

## 2015-06-04 NOTE — Progress Notes (Signed)
Speech Language Pathology Treatment: Dysphagia  Patient Details Name: Alan RipperRobert Wix MRN: [id number] DOB: Jan 28, 1975 Today's Date: 06/04/2015 Time: [id number] SLP Time Calculation (min) (ACUTE ONLY): 20 min  Assessment / Plan / Recommendation Clinical Impression  Pt repositioned for safer intake. He initially refused POs offered by closing his lips and turning his head away, but was receptive to ice chips offered. He had good oral acceptance and mastication of ice, but appeared to have oral holding after manipulation was complete. Pt consumed then consumed advanced trials of thin liquids and soft solids (he refused purees) with delayed coughing. Despite good mastication, pt did need oral suction to remove boluses from oral cavity. Pt is not yet ready for diet advancement. Would continue with Dys 1 textures and nectar thick liquids for now.   HPI HPI: Pt is a 40 y.o. male with PMH of autism and ADHD , sickle cell trait, independent prior to admission. Pt was admitted for PNA and discharged 03/10/15 readmitted 04/07/15 with respiratory failure requiring intubation, tracheostomy 03/20/2015; failed extubation and [facility]. Pt  decannulated 04/25/2015, diet slowly advanced to a regular consistency, bouts of tachycardia atrial fibrillation converted back to normal sinus rhythm and transferred to CIR.  SLP evaluated 5/4 and was being followed for dysphagia due to deconditioning and cognitive impairment but was on regular solids/ thin liquids. Transferred back to acute venue with desaturation in the 70's. BSE completed 5/7 recommending regular diet/thin liquids; continued with respiratory insufficiency and intubated 5/9-5/20 and reorders to assess swallow function.       SLP Plan  Continue with current plan of care;MBS     Recommendations  Diet recommendations: Dysphagia 1 (puree);Nectar-thick liquid Liquids provided via: Cup;Straw Medication Administration: Crushed  with puree Supervision: Patient able to self feed;Full supervision/cueing for compensatory strategies Compensations: Minimize environmental distractions;Slow rate;Small sips/bites Postural Changes and/or Swallow Maneuvers: Seated upright 90 degrees;Upright 30-60 min after meal             Oral Care Recommendations: Oral care BID;Other (Comment) (after meals) Follow up Recommendations: Inpatient Rehab Plan: Continue with current plan of care;MBS     GO               Maxcine HamLaura Paiewonsky, M.A. CCC-SLP [phone]  Maxcine Hamaiewonsky, Joanny Dupree 06/04/2015, 4:05 PM

## 2015-06-04 NOTE — Progress Notes (Signed)
[facility] LAB REPLACEMENT ONLY  The patient does [facility] based on the criteria listed below:   1. Is GFR >/= 40 ml/min? Yes.    Patient's GFR today is >60 2. Is urine output >/= 0.5 ml/kg/hr for the last 6 hours? Yes.   Patient's UOP is 0.75 ml/kg/hr 3. Is BUN < 60 mg/dL? Yes.    Patient's BUN today is 25 4. Abnormal electrolyte(s)   K - 3.3 5. Ordered repletion with: PER PROTOCOL 6. If a panic level lab has been reported, has the CCM MD in charge been notified? Yes.  .   Physician:  Dr. Kelli ChurnAlva  Jenene Kauffmann C Patriece Archbold 06/04/2015 4:55 AM

## 2015-06-04 NOTE — Progress Notes (Signed)
CRITICAL VALUE ALERT  Critical value received: serum osmolality  329   Date of notification:  06/04/15  Time of notification:  1130   Critical value read back:yes  Nurse who received alert:  P.SheltonRN  MD notified (1st page):  Dr Jamison NeighborNestor  Time of first page:  1200   MD notified (2nd page):  Time of second page:  Responding MD:  Dr Jamison NeighborNestor   Time MD responded:  1202

## 2015-06-05 DIAGNOSIS — G9340 Encephalopathy, unspecified: Secondary | ICD-10-CM

## 2015-06-05 DIAGNOSIS — F329 Major depressive disorder, single episode, unspecified: Secondary | ICD-10-CM

## 2015-06-05 LAB — CBC WITH DIFFERENTIAL/PLATELET
BASOS ABS: 0 10*3/uL (ref 0.0–0.1)
BASOS PCT: 0 %
EOS ABS: 0.1 10*3/uL (ref 0.0–0.7)
Eosinophils Relative: 1 %
HEMATOCRIT: 35.7 % — AB (ref 39.0–52.0)
Hemoglobin: 10.4 g/dL — ABNORMAL LOW (ref 13.0–17.0)
Lymphocytes Relative: 14 %
Lymphs Abs: 2.7 10*3/uL (ref 0.7–4.0)
MCH: 25.9 pg — ABNORMAL LOW (ref 26.0–34.0)
MCHC: 29.1 g/dL — ABNORMAL LOW (ref 30.0–36.0)
MCV: 89 fL (ref 78.0–100.0)
MONO ABS: 1.9 10*3/uL — AB (ref 0.1–1.0)
Monocytes Relative: 9 %
NEUTROS ABS: 15 10*3/uL — AB (ref 1.7–7.7)
Neutrophils Relative %: 76 %
PLATELETS: 315 10*3/uL (ref 150–400)
RBC: 4.01 MIL/uL — ABNORMAL LOW (ref 4.22–5.81)
RDW: 18.1 % — AB (ref 11.5–15.5)
WBC: 19.6 10*3/uL — ABNORMAL HIGH (ref 4.0–10.5)

## 2015-06-05 LAB — GLUCOSE, CAPILLARY
GLUCOSE-CAPILLARY: 107 mg/dL — AB (ref 65–99)
GLUCOSE-CAPILLARY: 130 mg/dL — AB (ref 65–99)
GLUCOSE-CAPILLARY: 115 mg/dL — AB (ref 65–99)
Glucose-Capillary: 99 mg/dL (ref 65–99)

## 2015-06-05 LAB — RENAL FUNCTION PANEL
ALBUMIN: 2.6 g/dL — AB (ref 3.5–5.0)
ANION GAP: 7 (ref 5–15)
BUN: 19 mg/dL (ref 6–20)
CALCIUM: 8.7 mg/dL — AB (ref 8.9–10.3)
CO2: 36 mmol/L — ABNORMAL HIGH (ref 22–32)
Chloride: 108 mmol/L (ref 101–111)
Creatinine, Ser: 0.96 mg/dL (ref 0.61–1.24)
GFR calc Af Amer: >60 mL/min (ref >60)
Glucose, Bld: 133 mg/dL — ABNORMAL HIGH (ref 65–99)
PHOSPHORUS: 2.9 mg/dL (ref 2.5–4.6)
POTASSIUM: 2.9 mmol/L — AB (ref 3.5–5.1)
Sodium: 151 mmol/L — ABNORMAL HIGH (ref 135–145)

## 2015-06-05 LAB — BASIC METABOLIC PANEL
Anion gap: 6 (ref 5–15)
BUN: 17 mg/dL (ref 6–20)
CALCIUM: 8.7 mg/dL — AB (ref 8.9–10.3)
CO2: 34 mmol/L — ABNORMAL HIGH (ref 22–32)
CREATININE: 0.92 mg/dL (ref 0.61–1.24)
Chloride: 108 mmol/L (ref 101–111)
Glucose, Bld: 129 mg/dL — ABNORMAL HIGH (ref 65–99)
Potassium: 3.7 mmol/L (ref 3.5–5.1)
SODIUM: 148 mmol/L — AB (ref 135–145)

## 2015-06-05 LAB — MAGNESIUM: MAGNESIUM: 1.8 mg/dL (ref 1.7–2.4)

## 2015-06-05 LAB — RAPID STREP SCREEN (MED CTR MEBANE ONLY): STREPTOCOCCUS, GROUP A SCREEN (DIRECT): NEGATIVE

## 2015-06-05 MED ORDER — POTASSIUM CHLORIDE 10 MEQ/50ML IV SOLN
10.0000 meq | INTRAVENOUS | Status: AC
  Administered 2015-06-05 (×6): 10 meq via INTRAVENOUS
  Filled 2015-06-05 (×6): qty 50

## 2015-06-05 MED ORDER — OLANZAPINE 5 MG PO TBDP
5.0000 mg | ORAL_TABLET | Freq: Two times a day (BID) | ORAL | Status: DC
  Administered 2015-06-05 – 2015-06-10 (×10): 5 mg via ORAL
  Filled 2015-06-05 (×13): qty 1

## 2015-06-05 NOTE — Progress Notes (Signed)
Patients sister did not want to try BIPAP for the night. Patients oxygen set at 3lpm with Sp02=96%. Patient does not appear to be in any distress at this time. Instructed the sister that if patient showed any signs of respiratory distress or a decrease in Sp02 to notify the nurse and respiratory would start bipap.

## 2015-06-05 NOTE — Consult Note (Signed)
[place] Psychiatry Consult   Reason for Consult:  Autism, depression and psychomotor withdrawal and s/p pneumonia Referring Physician:  Dr. Ashok Cordia Patient Identification: Juan Cameron MRN:  [id number] Principal Diagnosis: Autism Diagnosis:   Patient Active Problem List   Diagnosis Date Noted  . Acute on chronic respiratory failure with hypoxia (HCC) [J96.21]   . Chronic diastolic CHF (congestive heart failure) ([place]) [I50.32]   . Sinus tachycardia ([place]) [R00.0]   . Acute renal failure ([place]) [N17.9]   . Sickle cell trait ([place]) [D57.3]   . Essential hypertension [I10]   . SOB (shortness of breath) [R06.02]   . Pneumonia [J18.9]   . Acute pulmonary edema (HCC) [J81.0]   . Hypoxemia [R09.02]   . Sepsis ([place]) [A41.9]   . Cough [R05]   . Right knee pain [M25.561]   . Cognitive deficits [R41.89]   . Tachycardia [R00.0]   . Debility [R53.81] 05/06/2015  . Encephalopathy [G93.40] 05/06/2015  . 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 Ventilator dependence ([place]) [Z99.11] 04/08/2015  . Pressure ulcer [L89.90] 04/05/2015  . Acute respiratory failure ([place]) [J96.00]   . Fever [R50.9] 03/21/2015  . Acute renal insufficiency [N28.9] 03/21/2015  . Obesity [E66.9] 03/21/2015  . ARDS (adult respiratory distress syndrome) (HCC) [J80]   . HCAP (healthcare-associated pneumonia) [J18.9] 03/11/2015  . Leukocytosis [D72.829] 03/11/2015  . Acute hyperglycemia [R73.09] 03/11/2015  . Left ventricular diastolic dysfunction, NYHA class 2 [I51.9] 03/11/2015  . Thrombocytopenia ([place]) [D69.6] 03/11/2015  . Acute lung injury [S27.309A] 03/10/2015  . Acute respiratory failure with hypoxia ([place]) [J96.01] 02/28/2015  . CAP (community acquired pneumonia) [J18.9] 02/28/2015  . Hypokalemia [E87.6] 02/28/2015  . Autism [F84.0] 02/28/2015    Total Time spent with patient: 1 hour  Subjective:   Juan Cameron is a 40 y.o. male patient admitted with pneumonia and psychomotor retardation.  HPI:   40 years old male with autism disorder admitted with pneumonia and required several in and out of intubations since admitted. His last extubation was five days ago. He is poor historian and his sister is not at bed side. His staff RN stated that he functioning as a [age] or [age] years old and communicates in consistantly with staff. Reportedly he communicates with male staff better than male staff. He is not able to eat his meals and has significant psychomotor retardation and his sister felt he may have depressed being in hospital. He did not interact with his stuffed animal toy/security blanket during my visit.   Past Psychiatric History: Autism  Risk to Self: Is patient at risk for suicide?: No Risk to Others:   Prior Inpatient Therapy:   Prior Outpatient Therapy:    Past Medical History:  Past Medical History  Diagnosis Date  . Autism   . Sickle cell trait ([place])   . Pneumonia 02/2015  . Autism     Past Surgical History  Procedure Laterality Date  . No past surgeries     Family History:  Family History  Problem Relation Age of Onset  . Heart attack Mother    Family Psychiatric  History: Unknown Social History:  History  Alcohol Use No     History  Drug Use No    Social History   Social History  . Marital Status: Single    Spouse Name: N/A  . Number of Children: N/A  . Years of Education: N/A   Social History Main Topics  . Smoking status: Never Smoker   . Smokeless tobacco: Never Used  .  Alcohol Use: No  . Drug Use: No  . Sexual Activity: Yes   Other Topics Concern  . None   Social History Narrative   Additional Social History:    Allergies:   Allergies  Allergen Reactions  . Peanuts [Peanut Oil] Anaphylaxis    Labs:  Results for orders placed or performed during the hospital encounter of 05/16/15 (from the past 48 hour(s))  Basic metabolic panel     Status: Abnormal   Collection Time: 06/03/15  3:04 PM  Result Value Ref Range   Sodium 150 (H)  135 - 145 mmol/L   Potassium 3.4 (L) 3.5 - 5.1 mmol/L   Chloride 110 101 - 111 mmol/L   CO2 33 (H) 22 - 32 mmol/L   Glucose, Bld 342 (H) 65 - 99 mg/dL   BUN 27 (H) 6 - 20 mg/dL   Creatinine, Ser 1.11 0.61 - 1.24 mg/dL   Calcium 8.8 (L) 8.9 - 10.3 mg/dL   GFR calc non Af Amer >60 >60 mL/min   GFR calc Af Amer >60 >60 mL/min    Comment: (NOTE) The eGFR has been calculated using the CKD EPI equation. This calculation has not been validated in all clinical situations. eGFR's persistently <60 mL/min signify possible Chronic Kidney Disease.    Anion gap 7 5 - 15  Glucose, capillary     Status: Abnormal   Collection Time: 06/03/15  3:12 PM  Result Value Ref Range   Glucose-Capillary 140 (H) 65 - 99 mg/dL  Glucose, capillary     Status: Abnormal   Collection Time: 06/03/15  8:36 PM  Result Value Ref Range   Glucose-Capillary 134 (H) 65 - 99 mg/dL   Comment 1 Notify RN   Glucose, capillary     Status: Abnormal   Collection Time: 06/03/15 11:39 PM  Result Value Ref Range   Glucose-Capillary 132 (H) 65 - 99 mg/dL   Comment 1 Notify RN   Glucose, capillary     Status: Abnormal   Collection Time: 06/04/15  4:10 AM  Result Value Ref Range   Glucose-Capillary 128 (H) 65 - 99 mg/dL   Comment 1 Notify RN   Renal function panel     Status: Abnormal   Collection Time: 06/04/15  4:14 AM  Result Value Ref Range   Sodium 156 (H) 135 - 145 mmol/L   Potassium 3.3 (L) 3.5 - 5.1 mmol/L   Chloride 113 (H) 101 - 111 mmol/L   CO2 36 (H) 22 - 32 mmol/L   Glucose, Bld 131 (H) 65 - 99 mg/dL   BUN 25 (H) 6 - 20 mg/dL   Creatinine, Ser 0.96 0.61 - 1.24 mg/dL   Calcium 9.1 8.9 - 10.3 mg/dL   Phosphorus 3.0 2.5 - 4.6 mg/dL   Albumin 2.7 (L) 3.5 - 5.0 g/dL   GFR calc non Af Amer >60 >60 mL/min   GFR calc Af Amer >60 >60 mL/min    Comment: (NOTE) The eGFR has been calculated using the CKD EPI equation. This calculation has not been validated in all clinical situations. eGFR's persistently <60 mL/min  signify possible Chronic Kidney Disease.    Anion gap 7 5 - 15  Magnesium     Status: None   Collection Time: 06/04/15  4:14 AM  Result Value Ref Range   Magnesium 2.0 1.7 - 2.4 mg/dL  CBC with Differential/Platelet     Status: Abnormal   Collection Time: 06/04/15  4:14 AM  Result Value Ref Range  WBC 18.7 (H) 4.0 - 10.5 K/uL   RBC 3.70 (L) 4.22 - 5.81 MIL/uL   Hemoglobin 9.6 (L) 13.0 - 17.0 g/dL   HCT 32.2 (L) 39.0 - 52.0 %   MCV 87.0 78.0 - 100.0 fL   MCH 25.9 (L) 26.0 - 34.0 pg   MCHC 29.8 (L) 30.0 - 36.0 g/dL   RDW 18.1 (H) 11.5 - 15.5 %   Platelets 366 150 - 400 K/uL   Neutrophils Relative % 80 %   Neutro Abs 15.0 (H) 1.7 - 7.7 K/uL   Lymphocytes Relative 8 %   Lymphs Abs 1.5 0.7 - 4.0 K/uL   Monocytes Relative 12 %   Monocytes Absolute 2.2 (H) 0.1 - 1.0 K/uL   Eosinophils Relative 0 %   Eosinophils Absolute 0.0 0.0 - 0.7 K/uL   Basophils Relative 0 %   Basophils Absolute 0.0 0.0 - 0.1 K/uL  Osmolality     Status: Abnormal   Collection Time: 06/04/15  9:55 AM  Result Value Ref Range   Osmolality 329 (HH) 275 - 295 mOsm/kg    Comment: CRITICAL RESULT CALLED TO, READ BACK BY AND VERIFIED WITH: PENNY SHELTON,RN AT 1038 06/04/15 BY K BARR REPEATED TO VERIFY   Osmolality, urine     Status: None   Collection Time: 06/04/15 10:00 AM  Result Value Ref Range   Osmolality, Ur 453 300 - 900 mOsm/kg  Glucose, capillary     Status: Abnormal   Collection Time: 06/04/15 11:12 AM  Result Value Ref Range   Glucose-Capillary 100 (H) 65 - 99 mg/dL  Glucose, capillary     Status: Abnormal   Collection Time: 06/04/15  3:13 PM  Result Value Ref Range   Glucose-Capillary 111 (H) 65 - 99 mg/dL  Basic metabolic panel     Status: Abnormal   Collection Time: 06/04/15  4:46 PM  Result Value Ref Range   Sodium 151 (H) 135 - 145 mmol/L   Potassium 3.3 (L) 3.5 - 5.1 mmol/L   Chloride 109 101 - 111 mmol/L   CO2 34 (H) 22 - 32 mmol/L   Glucose, Bld 168 (H) 65 - 99 mg/dL   BUN 20 6 - 20  mg/dL   Creatinine, Ser 0.99 0.61 - 1.24 mg/dL   Calcium 9.0 8.9 - 10.3 mg/dL   GFR calc non Af Amer >60 >60 mL/min   GFR calc Af Amer >60 >60 mL/min    Comment: (NOTE) The eGFR has been calculated using the CKD EPI equation. This calculation has not been validated in all clinical situations. eGFR's persistently <60 mL/min signify possible Chronic Kidney Disease.    Anion gap 8 5 - 15  Glucose, capillary     Status: None   Collection Time: 06/04/15 10:07 PM  Result Value Ref Range   Glucose-Capillary 97 65 - 99 mg/dL  Renal function panel     Status: Abnormal   Collection Time: 06/05/15  5:03 AM  Result Value Ref Range   Sodium 151 (H) 135 - 145 mmol/L   Potassium 2.9 (L) 3.5 - 5.1 mmol/L   Chloride 108 101 - 111 mmol/L   CO2 36 (H) 22 - 32 mmol/L   Glucose, Bld 133 (H) 65 - 99 mg/dL   BUN 19 6 - 20 mg/dL   Creatinine, Ser 0.96 0.61 - 1.24 mg/dL   Calcium 8.7 (L) 8.9 - 10.3 mg/dL   Phosphorus 2.9 2.5 - 4.6 mg/dL   Albumin 2.6 (L) 3.5 - 5.0 g/dL  GFR calc non Af Amer >60 >60 mL/min   GFR calc Af Amer >60 >60 mL/min    Comment: (NOTE) The eGFR has been calculated using the CKD EPI equation. This calculation has not been validated in all clinical situations. eGFR's persistently <60 mL/min signify possible Chronic Kidney Disease.    Anion gap 7 5 - 15  Magnesium     Status: None   Collection Time: 06/05/15  5:03 AM  Result Value Ref Range   Magnesium 1.8 1.7 - 2.4 mg/dL  CBC with Differential/Platelet     Status: Abnormal   Collection Time: 06/05/15  5:03 AM  Result Value Ref Range   WBC 19.6 (H) 4.0 - 10.5 K/uL   RBC 4.01 (L) 4.22 - 5.81 MIL/uL   Hemoglobin 10.4 (L) 13.0 - 17.0 g/dL   HCT 35.7 (L) 39.0 - 52.0 %   MCV 89.0 78.0 - 100.0 fL   MCH 25.9 (L) 26.0 - 34.0 pg   MCHC 29.1 (L) 30.0 - 36.0 g/dL   RDW 18.1 (H) 11.5 - 15.5 %   Platelets 315 150 - 400 K/uL   Neutrophils Relative % 76 %   Neutro Abs 15.0 (H) 1.7 - 7.7 K/uL   Lymphocytes Relative 14 %   Lymphs  Abs 2.7 0.7 - 4.0 K/uL   Monocytes Relative 9 %   Monocytes Absolute 1.9 (H) 0.1 - 1.0 K/uL   Eosinophils Relative 1 %   Eosinophils Absolute 0.1 0.0 - 0.7 K/uL   Basophils Relative 0 %   Basophils Absolute 0.0 0.0 - 0.1 K/uL  Glucose, capillary     Status: Abnormal   Collection Time: 06/05/15  8:17 AM  Result Value Ref Range   Glucose-Capillary 107 (H) 65 - 99 mg/dL  Glucose, capillary     Status: None   Collection Time: 06/05/15 11:31 AM  Result Value Ref Range   Glucose-Capillary 99 65 - 99 mg/dL    Current Facility-Administered Medications  Medication Dose Route Frequency Provider Last Rate Last Dose  . acetaminophen (TYLENOL) solution 650 mg  650 mg Oral Q6H PRN Javier Glazier, MD      . acetaminophen (TYLENOL) suppository 650 mg  650 mg Rectal Q6H PRN Rigoberto Noel, MD   650 mg at 06/04/15 2218  . antiseptic oral rinse solution (CORINZ)  7 mL Mouth Rinse QID Rush Farmer, MD   7 mL at 06/05/15 0455  . chlorhexidine gluconate (SAGE KIT) (PERIDEX) 0.12 % solution 15 mL  15 mL Mouth Rinse BID Rush Farmer, MD   15 mL at 06/04/15 1941  . clonazePAM (KLONOPIN) tablet 0.5 mg  0.5 mg Oral BID Javier Glazier, MD   0.5 mg at 06/04/15 1000  . dextrose 5 % and 0.2 % NaCl infusion   Intravenous Continuous Javier Glazier, MD 125 mL/hr at 06/05/15 0810    . ferrous sulfate tablet 325 mg  325 mg Oral BID WC Javier Glazier, MD   Stopped at 06/05/15 0800  . heparin injection 5,000 Units  5,000 Units Subcutaneous Q8H Collene Gobble, MD   5,000 Units at 06/05/15 [id number]  . insulin aspart (novoLOG) injection 0-20 Units  0-20 Units Subcutaneous TID WC Javier Glazier, MD   0 Units at 06/04/15 0800  . insulin aspart (novoLOG) injection 0-5 Units  0-5 Units Subcutaneous QHS Javier Glazier, MD   0 Units at 06/04/15 2200  . ipratropium-albuterol (DUONEB) 0.5-2.5 (3) MG/3ML nebulizer solution 3 mL  3 mL  Nebulization Q2H PRN Chesley Mires, MD      . labetalol (NORMODYNE,TRANDATE) injection  10-20 mg  10-20 mg Intravenous Q2H PRN Rigoberto Noel, MD   10 mg at 06/04/15 2219  . LORazepam (ATIVAN) injection 0.5-1 mg  0.5-1 mg Intravenous Q4H PRN Javier Glazier, MD      . methylPREDNISolone sodium succinate (SOLU-MEDROL) 40 mg/mL injection 40 mg  40 mg Intravenous Daily Javier Glazier, MD   40 mg at 06/05/15 1059  . ondansetron (ZOFRAN) injection 4 mg  4 mg Intravenous Q6H PRN Collene Gobble, MD   4 mg at 06/02/15 0757  . pantoprazole (PROTONIX) injection 40 mg  40 mg Intravenous Q24H Javier Glazier, MD   40 mg at 06/05/15 0810  . phenol (CHLORASEPTIC) mouth spray 1 spray  1 spray Mouth/Throat PRN Javier Glazier, MD      . polyethylene glycol [facility] / GLYCOLAX) packet 17 g  17 g Per Tube Daily PRN Jake Church Masters, [facility]      . potassium chloride 10 mEq in 50 mL *CENTRAL LINE* IVPB  10 mEq Intravenous Q1 Hr x 6 Javier Glazier, MD   10 mEq at 06/05/15 1156  . QUEtiapine (SEROQUEL) tablet 100 mg  100 mg Oral BID Javier Glazier, MD   100 mg at 06/04/15 1000  . RESOURCE THICKENUP CLEAR   Oral PRN Rush Farmer, MD      . sodium chloride flush (NS) 0.9 % injection 10-40 mL  10-40 mL Intracatheter Q12H Colbert Coyer, MD   10 mL at 06/05/15 1000  . sodium chloride flush (NS) 0.9 % injection 10-40 mL  10-40 mL Intracatheter PRN Colbert Coyer, MD   10 mL at 06/04/15 0356  . vitamin C (ASCORBIC ACID) tablet 250 mg  250 mg Oral BID WC Javier Glazier, MD   Stopped at 06/05/15 0800  . white petrolatum (VASELINE) gel   Topical PRN Rigoberto Noel, MD   0.2 application at 37/10/62 1508    Musculoskeletal: Strength & Muscle Tone: decreased Gait & Station: unable to stand Patient leans: N/A  Psychiatric Specialty Exam: Physical Exam  Review of Systems  Unable to perform ROS    Blood pressure 151/93, pulse 99, temperature 98 F (36.7 C), temperature source Oral, resp. rate 21, height 5' 9"  (1.753 m), weight 187 lb 9.8 oz (85.1 kg), SpO2 96 %.Body mass index is  27.69 kg/(m^2).  General Appearance: Disheveled and Guarded  Eye Contact:  Fair  Speech:  nods his head for yes or no and not consistant.  Volume:  not applicable  Mood:  Depressed  Affect:  Depressed and Flat  Thought Process:  NA  Orientation:  Negative  Thought Content:  NA  Suicidal Thoughts:  No  Homicidal Thoughts:  No  Memory:  Immediate;   Poor Recent;   Poor  Judgement:  Poor  Insight:  Shallow  Psychomotor Activity:  Psychomotor Retardation  Concentration:  Concentration: Poor  Recall:  Poor  Fund of Knowledge:  Poor  Language:  Fair  Akathisia:  No  Handed:  Right  AIMS (if indicated):     Assets:  Others:  differed  ADL's:  Impaired  Cognition:  Impaired,  Severe  Sleep:        Treatment Plan Summary: Review of latest EKG indicates his QTC 436. Case discussed with Dr. Ashok Cordia and staff RN Discontinue his seroquel - not helpful Start Zyprexa Zydis 5 mg POBID for mood  and poor appetite and sleep. Appreciate psychiatric consultation and follow up as clinically required Please contact [phone] or [phone] if needs further assistance  Disposition: Patient does not meet criteria for psychiatric inpatient admission. Supportive therapy provided about ongoing stressors.  Ambrose Finland, MD 06/05/2015 1:05 PM

## 2015-06-05 NOTE — Progress Notes (Signed)
PULMONARY / CRITICAL CARE MEDICINE   Name: Juan Cameron MRN: [id number] DOB: [birth year]    ADMISSION DATE: 05/16/2015  REFERRING MD:  Rehab MD  CHIEF COMPLAINT: Respiratory Distress/ Hypoxemia  SUBJECTIVE:  Patient still hesitant to swallow. SLP did not recommend advancing diet. Patient less interactive today but nods no to any pain.   REVIEW OF SYSTEMS:  Unable to obtain given mental state.  VITAL SIGNS: BP 144/99 mmHg  Pulse 100  Temp(Src) 98.8 F (37.1 C) (Oral)  Resp 13  Ht _0  (1.753 m)  Wt 187 lb 9.8 oz (85.1 kg)  BMI 27.69 kg/m2  SpO2 99%  VENT SETTINGS:    INTAKE / OUTPUT: I/O last 3 completed shifts: In: 3498.3 [I.V.:3398.3; IV Piggyback:100] Out: 7253 [Urine:3225]  PHYSICAL EXAMINATION: General:  Awake. No acute distress.   Integument:  Warm & dry. No rash on exposed skin.  HEENT:  No scleral injection or icterus. PERRL.  Cardiovascular:  Regular rhythm and rate. No edema. No appreciable JVD.  Pulmonary:  Improving basilar crackles. Normal WOB on nasal cannula oxygen.  Abdomen: Soft. Normal bowel sounds. Nontender. Nondistended.  Neurological:  Attends to voice but only intermittently nods to questions. Moving all 4 extremities.  LABS:  BMET  Recent Labs Lab 06/04/15 0414 06/04/15 1646 06/05/15 0503  NA 156* 151* 151*  K 3.3* 3.3* 2.9*  CL 113* 109 108  CO2 36* 34* 36*  BUN 25* 20 19  CREATININE 0.96 0.99 0.96  GLUCOSE 131* 168* 133*   Electrolytes  Recent Labs Lab 06/03/15 0350  06/04/15 0414 06/04/15 1646 06/05/15 0503  CALCIUM 9.5  < > 9.1 9.0 8.7*  MG 2.2  --  2.0  --  1.8  PHOS 3.9  --  3.0  --  2.9  < > = values in this interval not displayed. CBC  Recent Labs Lab 06/03/15 0350 06/04/15 0414 06/05/15 0503  WBC 22.2* 18.7* 19.6*  HGB 9.4* 9.6* 10.4*  HCT 30.6* 32.2* 35.7*  PLT 492* 366 315   Coag's No results for input(s): APTT, INR in the last 168 hours.  Sepsis Markers No results for input(s): LATICACIDVEN,  PROCALCITON, O2SATVEN in the last 168 hours. ABG No results for input(s): PHART, PCO2ART, PO2ART in the last 168 hours. Liver Enzymes  Recent Labs Lab 06/02/15 1345 06/03/15 0350 06/04/15 0414 06/05/15 0503  AST 27  --   --   --   ALT 23  --   --   --   ALKPHOS 43  --   --   --   BILITOT 0.6  --   --   --   ALBUMIN 2.5* 2.8* 2.7* 2.6*    Cardiac Enzymes No results for input(s): TROPONINI, PROBNP in the last 168 hours.  Glucose  Recent Labs Lab 06/03/15 2036 06/03/15 2339 06/04/15 0410 06/04/15 1112 06/04/15 1513 06/04/15 2207  GLUCAP 134* 132* 128* 100* 111* 97    Imaging No results found.   STUDIES:  03/10/2015 Echo >> EF  60-65% 5/22 Abd [place]: No free fluid or hydronephrosis. Normal BG. Pancreas not well visualized.  CULTURES: 5/05 Urine >> negative 5/09 Blood >> negative 5/09 Sputum >> MRSA 5/14 Blood >> negative  ANTIBIOTICS: 5/02 Levaquin >> 5/03 5/04 Zosyn >> 5/07 5/04 Vancomycin >> 5/17 5/07 Rocephin >> 5/09 5/09 Fortaz >> 5/16  SIGNIFICANT EVENTS: 5/05 From rehab to [facility] with respiratory failure 5/09 To ICU >> VDRF 5/18 Transfuse PRBC, change to diprivan 5/19 Add solumedrol 5/20 Transfuse PRBC  LINES/TUBES: 5/04  Right PICC >> Foley >> ETT 5/9 - 5/20  ASSESSMENT / PLAN:  PULMONARY A: Acute Hypoxic Respiratory Failure - Secondary to HCAP & possible pulmonary edema. Elevated ESR - Unclear significance. Empirically started on steroids.  P:  Weaning FiO2 to keep SpO2 > 92% Solu-Medrol 64m IV daily & plan for a gradual wean over 2 weeks Chair Position to minimize atelectasis  CARDIOVASCULAR A:  Shock - Septic. Resolved.  P:  Monitor on telemetry. Vitals per unit protocol.  RENAL A: Acute Renal Failure - Resolved. Baseline creatinine 1.14 on 05/12/15. Hypernatremia -  Decreasing. Hypokalemia - Replacing  P:   Holding further lasix Trending electrolytes & renal function every 12 hours D5 1/4 NS at 125cc/hr KCl 10 mEq IV x6  runs  GASTROINTESTINAL A:   Diffuse Abdominal Pain Nutrition. Diarrhea. Dysphagia.  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 Hgb Stable. Leukocytosis - Stable. H/O sickle cell trait  P:  Trending cell counts daily with CBC Transfuse for Hb < 7 Ferrous Sulfate 302mbid Vitamin C bid Heparin Aneth q8hr  INFECTIOUS A:   MRSA HCAP - Completed Abx 5/17.  P:   Monitor clinically for signs of new infection No current antibiotics Plan to re-culture for fever >100.59F Checking Rapid Strep given sore throat  ENDOCRINE A:   Hyperglycemia - BG controlled.  P:   Accu-Checks q4hr Resistent Dose SSI per algorithm  NEUROLOGIC A:   Acute Encephalopathy vs Depression  Physical Deconditioning H/O Autism  P:   Klonopin 46m22mid PO Seroquel 100m57md PO Ativan  IV prn PT/OT consulted Psychiatry Consulted  FAMILY UPDATES:  Sister updated via phone 5/24 by Dr. NestAshok CordiaODAY'S SUMMARY:  40 y75. Male with h/o autism and sickle cell trait. Previously prolonged hospitalization for PNA with ARDS s/p tracheostomy then eventually eventually decannulated and transferred to rehab.  He developed progressive hypoxia leading to respiratory failure from MRSA pneumonia. His oxygen requirement is improving. Patient's hypernatremia is slowly improving as well. I remain concerned given his poor to absent oral intake. I have consulted psychiatry to evaluate the patient for possible depression. If he continues to refuse oral intake alternative means such as PEG tube or NG tube placement must be considered. Transferring patient to stepdown unit. Hospitalist service will see him care & PCCM will sign off as of 5/26.  JennSonia BallertAshok CordiaD. LeBaSpectrum Health Fuller Campusmonary & Critical Care Pager:  [phone] 3pm or if no response, call  [phone]/2017, 6:53 AM

## 2015-06-05 NOTE — Care Management Note (Signed)
Case Management Note  Patient Details  Name: Juan RipperRobert Cameron MRN: [id number] Date of Birth: 12-09-1975  Subjective/Objective:   Patient is from CIR with pna, resp failure on NRB and HFNC, has temp of 102, patient is autistic, he lives with his sister, Juan Cameron, [place] will [place] to follow for dc needs.                  Action/Plan: 06/05/2015 Pt remains intubated on sedation.  Plan is for pt to return to a facility - will reassess post extubation   Expected Discharge Date:                  Expected Discharge Plan:  IP Rehab Facility  In-House Referral:  Clinical Social Work  Discharge planning Services  CM Consult  Post Acute Care Choice:    Choice offered to:     DME Arranged:    DME Agency:     HH Arranged:    HH Agency:     Status of Service:  In process, will continue to follow  Medicare Important Message Given:  Yes Date Medicare IM Given:    Medicare IM give by:    Date Additional Medicare IM Given:    Additional Medicare Important Message give by:     If discussed at Long Length of Stay Meetings, dates discussed:    Additional Comments: 06/05/2015  Pt extubated - actively titrating oxygen needs down.  Patient still hesitant to swallow. SLP did not recommend advancing diet. Discussed in LOS 06/05/15 - physician advisor feels pt is appropriate for LTACH.  CM spoke with attending and communicated recommendation - plan is for pt to transfer to [facility] - ongoing concern of nutritional needs (may possibly need PEG tube if oral intake doesn't increase).  When pt is deemed appropriate for LTACH [facility] will need to be [facility].  05/29/15 Pt remains on ventilator with continuous sedation and pressors, tube feeds and IV antibiotics.   Per attending - tenative plan for trach to assist with weaning.  CM contacted [facility] choice of LTACH to determine if pt could possibly discharge back to [facility] when medically clear - liaison will follow back up with  CM Juan Cameron, Juan Dissinger S, RN 06/05/2015, 2:04 PM

## 2015-06-06 LAB — CBC WITH DIFFERENTIAL/PLATELET
BASOS PCT: 0 %
Basophils Absolute: 0 10*3/uL (ref 0.0–0.1)
EOS ABS: 0.5 10*3/uL (ref 0.0–0.7)
Eosinophils Relative: 3 %
HEMATOCRIT: 32.9 % — AB (ref 39.0–52.0)
HEMOGLOBIN: 9.9 g/dL — AB (ref 13.0–17.0)
LYMPHS ABS: 2.7 10*3/uL (ref 0.7–4.0)
Lymphocytes Relative: 16 %
MCH: 26.2 pg (ref 26.0–34.0)
MCHC: 30.1 g/dL (ref 30.0–36.0)
MCV: 87 fL (ref 78.0–100.0)
MONO ABS: 1.5 10*3/uL — AB (ref 0.1–1.0)
MONOS PCT: 9 %
Neutro Abs: 12.5 10*3/uL — ABNORMAL HIGH (ref 1.7–7.7)
Neutrophils Relative %: 72 %
Platelets: 266 10*3/uL (ref 150–400)
RBC: 3.78 MIL/uL — ABNORMAL LOW (ref 4.22–5.81)
RDW: 18 % — AB (ref 11.5–15.5)
WBC: 17.2 10*3/uL — ABNORMAL HIGH (ref 4.0–10.5)

## 2015-06-06 LAB — GLUCOSE, CAPILLARY
GLUCOSE-CAPILLARY: 98 mg/dL (ref 65–99)
GLUCOSE-CAPILLARY: 91 mg/dL (ref 65–99)
GLUCOSE-CAPILLARY: 128 mg/dL — AB (ref 65–99)
Glucose-Capillary: 125 mg/dL — ABNORMAL HIGH (ref 65–99)
Glucose-Capillary: 94 mg/dL (ref 65–99)

## 2015-06-06 LAB — RENAL FUNCTION PANEL
ALBUMIN: 2.6 g/dL — AB (ref 3.5–5.0)
Anion gap: 7 (ref 5–15)
BUN: 18 mg/dL (ref 6–20)
CALCIUM: 8.6 mg/dL — AB (ref 8.9–10.3)
CO2: 34 mmol/L — AB (ref 22–32)
Chloride: 108 mmol/L (ref 101–111)
Creatinine, Ser: 1.12 mg/dL (ref 0.61–1.24)
GFR calc Af Amer: >60 mL/min (ref >60)
GFR calc non Af Amer: >60 mL/min (ref >60)
GLUCOSE: 109 mg/dL — AB (ref 65–99)
PHOSPHORUS: 3 mg/dL (ref 2.5–4.6)
Potassium: 3.3 mmol/L — ABNORMAL LOW (ref 3.5–5.1)
SODIUM: 149 mmol/L — AB (ref 135–145)

## 2015-06-06 LAB — C DIFFICILE QUICK SCREEN W PCR REFLEX
C DIFFICILE (CDIFF) INTERP: NEGATIVE
C DIFFICILE (CDIFF) TOXIN: NEGATIVE
C DIFFICLE (CDIFF) ANTIGEN: NEGATIVE

## 2015-06-06 LAB — BASIC METABOLIC PANEL
Anion gap: 4 — ABNORMAL LOW (ref 5–15)
BUN: 18 mg/dL (ref 6–20)
CHLORIDE: 107 mmol/L (ref 101–111)
CO2: 33 mmol/L — AB (ref 22–32)
CREATININE: 1.14 mg/dL (ref 0.61–1.24)
Calcium: 8.3 mg/dL — ABNORMAL LOW (ref 8.9–10.3)
GFR calc non Af Amer: >60 mL/min (ref >60)
Glucose, Bld: 126 mg/dL — ABNORMAL HIGH (ref 65–99)
POTASSIUM: 3.7 mmol/L (ref 3.5–5.1)
SODIUM: 144 mmol/L (ref 135–145)

## 2015-06-06 LAB — CULTURE, GROUP A STREP (THRC)

## 2015-06-06 LAB — MAGNESIUM: Magnesium: 1.6 mg/dL — ABNORMAL LOW (ref 1.7–2.4)

## 2015-06-06 MED ORDER — MAGNESIUM SULFATE 2 GM/50ML IV SOLN
2.0000 g | Freq: Once | INTRAVENOUS | Status: AC
  Administered 2015-06-06: 2 g via INTRAVENOUS
  Filled 2015-06-06: qty 50

## 2015-06-06 MED ORDER — CLONAZEPAM 0.5 MG PO TABS
0.5000 mg | ORAL_TABLET | Freq: Every day | ORAL | Status: DC
  Administered 2015-06-07 – 2015-06-09 (×3): 0.5 mg via ORAL
  Filled 2015-06-06 (×3): qty 1

## 2015-06-06 MED ORDER — POTASSIUM CL IN DEXTROSE 5% 20 MEQ/L IV SOLN
20.0000 meq | INTRAVENOUS | Status: DC
Start: 2015-06-06 — End: 2015-06-07
  Administered 2015-06-06: 20 meq via INTRAVENOUS
  Filled 2015-06-06 (×6): qty 1000

## 2015-06-06 MED ORDER — POTASSIUM CHLORIDE 10 MEQ/100ML IV SOLN
10.0000 meq | INTRAVENOUS | Status: AC
  Administered 2015-06-06 (×3): 10 meq via INTRAVENOUS
  Filled 2015-06-06 (×3): qty 100

## 2015-06-06 MED ORDER — PANTOPRAZOLE SODIUM 40 MG PO TBEC
40.0000 mg | DELAYED_RELEASE_TABLET | Freq: Every day | ORAL | Status: DC
  Administered 2015-06-07 – 2015-06-10 (×4): 40 mg via ORAL
  Filled 2015-06-06 (×4): qty 1

## 2015-06-06 NOTE — Progress Notes (Signed)
PT Cancellation Note  Patient Details Name: Juan RipperRobert Biscardi MRN: [id number] DOB: 1975/02/16   Cancelled Treatment:    Reason Eval/Treat Not Completed: Medical issues which prohibited therapy (Nurse had to give pt meds and pt lethargic.  Will return Mon) Thanks.  Tawni MillersWhite, [name] 06/06/2015, 1:24 PM Enter[employer] CorporationDawn Alaija Ruble,PT Acute Rehabilitation [phone] (pager)

## 2015-06-06 NOTE — Progress Notes (Signed)
Pt not yet at a level to be able to tolerate the intensity of an inpt rehab admission. I will continue to follow his progress. [phone]

## 2015-06-06 NOTE — Progress Notes (Signed)
Speech Language Pathology Treatment: Dysphagia  Patient Details Name: Alan RipperRobert Alvis MRN: [id number] DOB: [birth year] Today's Date: 06/06/2015 Time: [id number] SLP Time Calculation (min) (ACUTE ONLY): 25 min  Assessment / Plan / Recommendation Clinical Impression  Patient seen to address dysphagia goals and determine readiness for upgrade of diet beyond current restrictions of Dys 1, Nectar thick liquids. Patient refused any solids and confirmed by saying "yeah" when clinician asked him if he wanted a coke. He drank 10 ounces of thin liquids (coke) with straw sips and although he was impulsive, he did not exhibit any overt s/s of aspiration. Vocal quality remained at baseline, which is mildly hoarse. Patient's RN stated that he has been refusing to eat but he did take his medications with a lot of coaxing and apparently it took her 40 minutes to get him to take his medications this morning.   HPI HPI: Pt is a 40 y.o. male with PMH of autism and ADHD , sickle cell trait, independent prior to admission. Pt was admitted for PNA and discharged 03/10/15 readmitted 04/07/15 with respiratory failure requiring intubation, tracheostomy 03/20/2015; failed extubation [facility]. Pt  decannulated 04/25/2015, diet slowly advanced to a regular consistency, bouts of tachycardia atrial fibrillation converted back to normal sinus rhythm and transferred to CIR.  SLP evaluated 5/4 and was being followed for dysphagia due to deconditioning and cognitive impairment but was on regular solids/ thin liquids. Transferred back to acute venue with desaturation in the 70's. BSE completed 5/7 recommending regular diet/thin liquids; continued with respiratory insufficiency and intubated 5/9-5/20 and reorders to assess swallow function.       SLP Plan  Continue with current plan of care     Recommendations  Diet recommendations: Dysphagia 1 (puree);Thin liquid Liquids provided via:  Cup;Straw Medication Administration: Crushed with puree Supervision: Patient able to self feed;Full supervision/cueing for compensatory strategies Compensations: Minimize environmental distractions;Slow rate;Small sips/bites Postural Changes and/or Swallow Maneuvers: Seated upright 90 degrees;Upright 30-60 min after meal             Oral Care Recommendations: Oral care BID (after meals) Plan: Continue with current plan of care     GO                Pablo Lawrencereston, Shelanda Duvall Tarrell 06/06/2015, 4:02 PM   Angela NevinJohn T. Verlin Uher, MA, CCC-SLP 06/06/2015 4:05 PM

## 2015-06-06 NOTE — Progress Notes (Signed)
[facility] TEAM 1 - Stepdown/ICU Juan Cameron  WIO:[id number] DOB: [birth year] DOA: 05/16/2015 PCP: Wallene Dales, MD    Brief Narrative:  40 y.o. Male with h/o autism and sickle cell trait. Previously prolonged hospitalization for PNA with ARDS s/p tracheostomy then eventually eventually decannulated and transferred to rehab. He developed progressive hypoxia leading to respiratory failure from MRSA pneumonia. His oxygen requirement is improving. Patient's hypernatremia is slowly improving as well. I remain concerned given his poor to absent oral intake. I have consulted psychiatry to evaluate the patient for possible depression. If he continues to refuse oral intake alternative means such as PEG tube or NG tube placement must be considered. Transferring patient to stepdown unit. Hospitalist service will see him care & PCCM will sign off as of 5/26.  Significant Events: 5/05 From from rehab to [facility] with respiratory failure 5/09 To ICU > VDRF 5/18 Transfuse PRBC, changed to diprivan 5/19 Added solumedrol 5/20 Transfuse PRBC  Assessment & Plan:  Acute Hypoxic Respiratory Failure due to MRSA Pneumonia and pulmonary edema  Solu-Medrol 6m IV daily on a gradual wean over 2 weeks - has completed a course of abx tx   Septic Shock due to PNA resolved  Acute Renal Failure Resolved - baseline creatinine 1.14 on 05/12/15  Hypernatremia Increase free water and follow   Hypokalemia  Replace  Hypomagnesemia  Replace and follow   Dysphagia Dysphagia 1 diet w/ thin liquids- refusing to eat - SLP following - may need PEG tube if continues to refuse to swallow  Anemia of Chronic critical illness  Transfuse for Hb < 7 - stable presently   H/O sickle cell trait  Hyperglycemia On steroids and dextrose IVF - check A1c  Acute Encephalopathy vs Depression  Psych has evaluated - cont zprexa per Psych   Physical Deconditioning  H/O Autism - Mental impairment Reportedly has a very limited  vocabulary (~500 words)  DVT prophylaxis:  Code Status: FULL CODE Family Communication: no family present at time of exam  Disposition Plan: SQ heparin   Consultants:  PCCM Psychiatry   Antimicrobials:  5/02 Levaquin >> 5/03 5/04 Zosyn >> 5/07 5/04 Vancomycin >> 5/17 5/07 Rocephin >> 5/09 5/09 FTressie Ellis>> 5/16  Subjective: Pt does not communicate w/ me.  He does not appear to be in pain or in any respiratory distress.    Objective: Blood pressure 121/74, pulse 87, temperature 99.9 F (37.7 C), temperature source Axillary, resp. rate 22, height 5' 9" (1.753 m), weight 83.4 kg (183 lb 13.8 oz), SpO2 100 %.  Intake/Output Summary (Last 24 hours) at 06/06/15 1643 Last data filed at 06/06/15 1448  Gross per 24 hour  Intake   2620 ml  Output    850 ml  Net   1770 ml   Filed Weights   06/05/15 0500 06/05/15 1732 06/06/15 0500  Weight: 85.1 kg (187 lb 9.8 oz) 83.8 kg (184 lb 11.9 oz) 83.4 kg (183 lb 13.8 oz)    Examination: General: No acute respiratory distress Lungs: Clear to auscultation bilaterally without wheezes or crackles Cardiovascular: Regular rate and rhythm without murmur gallop or rub normal S1 and S2 Abdomen: Nontender, nondistended, soft, bowel sounds positive, no rebound, no ascites, no appreciable mass Extremities: No significant cyanosis, clubbing, or edema bilateral lower extremities  CBC:  Recent Labs Lab 06/02/15 0300 06/03/15 0350 06/04/15 0414 06/05/15 0503 06/06/15 0537  WBC 20.3* 22.2* 18.7* 19.6* 17.2*  NEUTROABS  --  17.8* 15.0* 15.0* 12.5*  HGB 9.2* 9.4* 9.6*  10.4* 9.9*  HCT 29.4* 30.6* 32.2* 35.7* 32.9*  MCV 84.2 86.0 87.0 89.0 87.0  PLT 521* 492* 366 315 607   Basic Metabolic Panel:  Recent Labs Lab 06/03/15 0350  06/04/15 0414 06/04/15 1646 06/05/15 0503 06/05/15 1825 06/06/15 0537  NA 156*  < > 156* 151* 151* 148* 149*  K 3.3*  < > 3.3* 3.3* 2.9* 3.7 3.3*  CL 112*  < > 113* 109 108 108 108  CO2 32  < > 36* 34* 36* 34* 34*    GLUCOSE 123*  < > 131* 168* 133* 129* 109*  BUN 31*  < > 25* _0 CREATININE 1.07  < > 0.96 0.99 0.96 0.92 1.12  CALCIUM 9.5  < > 9.1 9.0 8.7* 8.7* 8.6*  MG 2.2  --  2.0  --  1.8  --  1.6*  PHOS 3.9  --  3.0  --  2.9  --  3.0  < > = values in this interval not displayed. GFR: Estimated Creatinine Clearance: 89.4 mL/min (by C-G formula based on Cr of 1.12).  Liver Function Tests:  Recent Labs Lab 06/02/15 1345 06/03/15 0350 06/04/15 0414 06/05/15 0503 06/06/15 0537  AST 27  --   --   --   --   ALT 23  --   --   --   --   ALKPHOS 43  --   --   --   --   BILITOT 0.6  --   --   --   --   PROT 6.1*  --   --   --   --   ALBUMIN 2.5* 2.8* 2.7* 2.6* 2.6*    Recent Labs Lab 06/02/15 1345  LIPASE 98*  AMYLASE 132*    CBG:  Recent Labs Lab 06/05/15 0817 06/05/15 1131 06/05/15 1541 06/05/15 2129 06/06/15 0819  GLUCAP 107* 99 130* 115* 91    Recent Results (from the past 240 hour(s))  Rapid strep screen (not at [facility])     Status: None   Collection Time: 06/05/15 10:10 PM  Result Value Ref Range Status   Streptococcus, Group A Screen (Direct) NEGATIVE NEGATIVE Final    Comment: (NOTE) A Rapid Antigen test may result negative if the antigen level in the sample is below the detection level of this test. The FDA has not cleared this test as a stand-alone test therefore the rapid antigen negative result has reflexed to a Group A Strep culture.   Culture, group A strep     Status: None   Collection Time: 06/05/15 10:10 PM  Result Value Ref Range Status   Specimen Description THROAT  Final   Special Requests NONE Reflexed from [id number]  Final   Culture FEW STREPTOCOCCUS,BETA HEMOLYTIC NOT GROUP A  Final   Report Status 06/06/2015 FINAL  Final  C difficile quick scan w PCR reflex     Status: None   Collection Time: 06/06/15  1:20 PM  Result Value Ref Range Status   C Diff antigen NEGATIVE NEGATIVE Final   C Diff toxin NEGATIVE NEGATIVE Final   C Diff  interpretation Negative for toxigenic C. difficile  Final     Scheduled Meds: . antiseptic oral rinse  7 mL Mouth Rinse QID  . chlorhexidine gluconate (SAGE KIT)  15 mL Mouth Rinse BID  . clonazePAM  0.5 mg Oral BID  . ferrous sulfate  325 mg Oral BID WC  . heparin subcutaneous  5,000 Units Subcutaneous Q8H  .  insulin aspart  0-20 Units Subcutaneous TID WC  . insulin aspart  0-5 Units Subcutaneous QHS  . methylPREDNISolone (SOLU-MEDROL) injection  40 mg Intravenous Daily  . OLANZapine zydis  5 mg Oral BID  . [START ON 06/07/2015] pantoprazole  40 mg Oral Daily  . sodium chloride flush  10-40 mL Intracatheter Q12H  . vitamin C  250 mg Oral BID WC   Continuous Infusions: . dextrose 5 % and 0.2 % NaCl 125 mL/hr at 06/06/15 1602     LOS: 21 days   Time spent: 35 minutes   Cherene Altes, MD [facility] Office  [phone] Pager - Text Page per Shea Evans as per below:  On-Call/Text Page:      [email or web address]      password [email or web address]  If 7PM-7AM, please contact night-coverage [email or web address] Password [facility] 06/06/2015, 4:43 PM

## 2015-06-06 NOTE — Consult Note (Signed)
[facility] Face-to-Face Psychiatry Consult follow-up  Reason for Consult:  Autism, depression and psychomotor withdrawal and s/p pneumonia Referring Physician:  Dr. Ashok Cordia Patient Identification: Juan Cameron MRN:  [id number] Principal Diagnosis: Autism Diagnosis:   Patient Active Problem List   Diagnosis Date Noted  . Acute on chronic respiratory failure with hypoxia (HCC) [J96.21]   . Chronic diastolic CHF (congestive heart failure) ([place]) [I50.32]   . Sinus tachycardia ([place]) [R00.0]   . Acute renal failure ([place]) [N17.9]   . Sickle cell trait ([place]) [D57.3]   . Essential hypertension [I10]   . SOB (shortness of breath) [R06.02]   . Pneumonia [J18.9]   . Acute pulmonary edema (HCC) [J81.0]   . Hypoxemia [R09.02]   . Sepsis ([place]) [A41.9]   . Cough [R05]   . Right knee pain [M25.561]   . Cognitive deficits [R41.89]   . Tachycardia [R00.0]   . Debility [R53.81] 05/06/2015  . Encephalopathy [G93.40] 05/06/2015  . 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 Ventilator dependence ([place]) [Z99.11] 04/08/2015  . Pressure ulcer [L89.90] 04/05/2015  . Acute respiratory failure ([place]) [J96.00]   . Fever [R50.9] 03/21/2015  . Acute renal insufficiency [N28.9] 03/21/2015  . Obesity [E66.9] 03/21/2015  . ARDS (adult respiratory distress syndrome) (HCC) [J80]   . HCAP (healthcare-associated pneumonia) [J18.9] 03/11/2015  . Leukocytosis [D72.829] 03/11/2015  . Acute hyperglycemia [R73.09] 03/11/2015  . Left ventricular diastolic dysfunction, NYHA class 2 [I51.9] 03/11/2015  . Thrombocytopenia ([place]) [D69.6] 03/11/2015  . Acute lung injury [S27.309A] 03/10/2015  . Acute respiratory failure with hypoxia ([place]) [J96.01] 02/28/2015  . CAP (community acquired pneumonia) [J18.9] 02/28/2015  . Hypokalemia [E87.6] 02/28/2015  . Autism [F84.0] 02/28/2015    Total Time spent with patient: 20 minutes  Subjective:   Juan Cameron is a 40 y.o. male patient admitted with pneumonia and psychomotor  retardation.  HPI:  Juan Cameron is a 40 years old male with autism disorder admitted with pneumonia and required several in and out of intubations since admitted. His last extubation was five days ago. He is poor historian and his sister is not at bed side. His staff RN stated that he functioning as a [age] or [age] years old and communicates in consistantly with staff. Reportedly he communicates with male staff better than male staff. He is not able to eat his meals and has significant psychomotor retardation and his sister felt he may have depressed being in hospital. He did not interact with his stuffed animal toy/security blanket during my visit.   Past Psychiatric History: Autism  Interval history: Patient seen face-to-face for psychiatric consultation follow-up today. Patient appeared awake and watching television without any distress. Patient has limited verbal response mostly nodding his head. Patient staff RN and support staff reported he was able to drink orange juice and also able to take a few spoons of his solid food. Patient has been compliant with Zyprexa Zydis orally disintegrating medication which is aimed at improving his appetite and oral intake at this time. Patient does not exhibit any psychotic symptoms or irritability, agitation or aggressive behaviors.  Risk to Self: Is patient at risk for suicide?: No Risk to Others:   Prior Inpatient Therapy:   Prior Outpatient Therapy:    Past Medical History:  Past Medical History  Diagnosis Date  . Autism   . Sickle cell trait ([place])   . Pneumonia 02/2015  . Autism     Past Surgical History  Procedure Laterality Date  . No past surgeries  Family History:  Family History  Problem Relation Age of Onset  . Heart attack Mother    Family Psychiatric  History: Unknown Social History:  History  Alcohol Use No     History  Drug Use No    Social History   Social History  . Marital Status: Single    Spouse Name: N/A  .  Number of Children: N/A  . Years of Education: N/A   Social History Main Topics  . Smoking status: Never Smoker   . Smokeless tobacco: Never Used  . Alcohol Use: No  . Drug Use: No  . Sexual Activity: Yes   Other Topics Concern  . None   Social History Narrative   Additional Social History:    Allergies:   Allergies  Allergen Reactions  . Peanuts [Peanut Oil] Anaphylaxis    Labs:  Results for orders placed or performed during the hospital encounter of 05/16/15 (from the past 48 hour(s))  Osmolality     Status: Abnormal   Collection Time: 06/04/15  9:55 AM  Result Value Ref Range   Osmolality 329 (HH) 275 - 295 mOsm/kg    Comment: CRITICAL RESULT CALLED TO, READ BACK BY AND VERIFIED WITH: PENNY SHELTON,RN AT 1038 06/04/15 BY K BARR REPEATED TO VERIFY   Osmolality, urine     Status: None   Collection Time: 06/04/15 10:00 AM  Result Value Ref Range   Osmolality, Ur 453 300 - 900 mOsm/kg  Glucose, capillary     Status: Abnormal   Collection Time: 06/04/15 11:12 AM  Result Value Ref Range   Glucose-Capillary 100 (H) 65 - 99 mg/dL  Glucose, capillary     Status: Abnormal   Collection Time: 06/04/15  3:13 PM  Result Value Ref Range   Glucose-Capillary 111 (H) 65 - 99 mg/dL  Basic metabolic panel     Status: Abnormal   Collection Time: 06/04/15  4:46 PM  Result Value Ref Range   Sodium 151 (H) 135 - 145 mmol/L   Potassium 3.3 (L) 3.5 - 5.1 mmol/L   Chloride 109 101 - 111 mmol/L   CO2 34 (H) 22 - 32 mmol/L   Glucose, Bld 168 (H) 65 - 99 mg/dL   BUN 20 6 - 20 mg/dL   Creatinine, Ser 0.99 0.61 - 1.24 mg/dL   Calcium 9.0 8.9 - 10.3 mg/dL   GFR calc non Af Amer >60 >60 mL/min   GFR calc Af Amer >60 >60 mL/min    Comment: (NOTE) The eGFR has been calculated using the CKD EPI equation. This calculation has not been validated in all clinical situations. eGFR's persistently <60 mL/min signify possible Chronic Kidney Disease.    Anion gap 8 5 - 15  Glucose, capillary      Status: None   Collection Time: 06/04/15 10:07 PM  Result Value Ref Range   Glucose-Capillary 97 65 - 99 mg/dL  Renal function panel     Status: Abnormal   Collection Time: 06/05/15  5:03 AM  Result Value Ref Range   Sodium 151 (H) 135 - 145 mmol/L   Potassium 2.9 (L) 3.5 - 5.1 mmol/L   Chloride 108 101 - 111 mmol/L   CO2 36 (H) 22 - 32 mmol/L   Glucose, Bld 133 (H) 65 - 99 mg/dL   BUN 19 6 - 20 mg/dL   Creatinine, Ser 0.96 0.61 - 1.24 mg/dL   Calcium 8.7 (L) 8.9 - 10.3 mg/dL   Phosphorus 2.9 2.5 - 4.6 mg/dL  Albumin 2.6 (L) 3.5 - 5.0 g/dL   GFR calc non Af Amer >60 >60 mL/min   GFR calc Af Amer >60 >60 mL/min    Comment: (NOTE) The eGFR has been calculated using the CKD EPI equation. This calculation has not been validated in all clinical situations. eGFR's persistently <60 mL/min signify possible Chronic Kidney Disease.    Anion gap 7 5 - 15  Magnesium     Status: None   Collection Time: 06/05/15  5:03 AM  Result Value Ref Range   Magnesium 1.8 1.7 - 2.4 mg/dL  CBC with Differential/Platelet     Status: Abnormal   Collection Time: 06/05/15  5:03 AM  Result Value Ref Range   WBC 19.6 (H) 4.0 - 10.5 K/uL   RBC 4.01 (L) 4.22 - 5.81 MIL/uL   Hemoglobin 10.4 (L) 13.0 - 17.0 g/dL   HCT 35.7 (L) 39.0 - 52.0 %   MCV 89.0 78.0 - 100.0 fL   MCH 25.9 (L) 26.0 - 34.0 pg   MCHC 29.1 (L) 30.0 - 36.0 g/dL   RDW 18.1 (H) 11.5 - 15.5 %   Platelets 315 150 - 400 K/uL   Neutrophils Relative % 76 %   Neutro Abs 15.0 (H) 1.7 - 7.7 K/uL   Lymphocytes Relative 14 %   Lymphs Abs 2.7 0.7 - 4.0 K/uL   Monocytes Relative 9 %   Monocytes Absolute 1.9 (H) 0.1 - 1.0 K/uL   Eosinophils Relative 1 %   Eosinophils Absolute 0.1 0.0 - 0.7 K/uL   Basophils Relative 0 %   Basophils Absolute 0.0 0.0 - 0.1 K/uL  Glucose, capillary     Status: Abnormal   Collection Time: 06/05/15  8:17 AM  Result Value Ref Range   Glucose-Capillary 107 (H) 65 - 99 mg/dL  Glucose, capillary     Status: None    Collection Time: 06/05/15 11:31 AM  Result Value Ref Range   Glucose-Capillary 99 65 - 99 mg/dL  Glucose, capillary     Status: Abnormal   Collection Time: 06/05/15  3:41 PM  Result Value Ref Range   Glucose-Capillary 130 (H) 65 - 99 mg/dL  Basic metabolic panel     Status: Abnormal   Collection Time: 06/05/15  6:25 PM  Result Value Ref Range   Sodium 148 (H) 135 - 145 mmol/L   Potassium 3.7 3.5 - 5.1 mmol/L    Comment: DELTA CHECK NOTED   Chloride 108 101 - 111 mmol/L   CO2 34 (H) 22 - 32 mmol/L   Glucose, Bld 129 (H) 65 - 99 mg/dL   BUN 17 6 - 20 mg/dL   Creatinine, Ser 0.92 0.61 - 1.24 mg/dL   Calcium 8.7 (L) 8.9 - 10.3 mg/dL   GFR calc non Af Amer >60 >60 mL/min   GFR calc Af Amer >60 >60 mL/min    Comment: (NOTE) The eGFR has been calculated using the CKD EPI equation. This calculation has not been validated in all clinical situations. eGFR's persistently <60 mL/min signify possible Chronic Kidney Disease.    Anion gap 6 5 - 15  Glucose, capillary     Status: Abnormal   Collection Time: 06/05/15  9:29 PM  Result Value Ref Range   Glucose-Capillary 115 (H) 65 - 99 mg/dL  Rapid strep screen (not at [facility])     Status: None   Collection Time: 06/05/15 10:10 PM  Result Value Ref Range   Streptococcus, Group A Screen (Direct) NEGATIVE NEGATIVE  Comment: (NOTE) A Rapid Antigen test may result negative if the antigen level in the sample is below the detection level of this test. The FDA has not cleared this test as a stand-alone test therefore the rapid antigen negative result has reflexed to a Group A Strep culture.   Renal function panel     Status: Abnormal   Collection Time: 06/06/15  5:37 AM  Result Value Ref Range   Sodium 149 (H) 135 - 145 mmol/L   Potassium 3.3 (L) 3.5 - 5.1 mmol/L   Chloride 108 101 - 111 mmol/L   CO2 34 (H) 22 - 32 mmol/L   Glucose, Bld 109 (H) 65 - 99 mg/dL   BUN 18 6 - 20 mg/dL   Creatinine, Ser 1.12 0.61 - 1.24 mg/dL   Calcium 8.6 (L)  8.9 - 10.3 mg/dL   Phosphorus 3.0 2.5 - 4.6 mg/dL   Albumin 2.6 (L) 3.5 - 5.0 g/dL   GFR calc non Af Amer >60 >60 mL/min   GFR calc Af Amer >60 >60 mL/min    Comment: (NOTE) The eGFR has been calculated using the CKD EPI equation. This calculation has not been validated in all clinical situations. eGFR's persistently <60 mL/min signify possible Chronic Kidney Disease.    Anion gap 7 5 - 15  Magnesium     Status: Abnormal   Collection Time: 06/06/15  5:37 AM  Result Value Ref Range   Magnesium 1.6 (L) 1.7 - 2.4 mg/dL  CBC with Differential/Platelet     Status: Abnormal   Collection Time: 06/06/15  5:37 AM  Result Value Ref Range   WBC 17.2 (H) 4.0 - 10.5 K/uL   RBC 3.78 (L) 4.22 - 5.81 MIL/uL   Hemoglobin 9.9 (L) 13.0 - 17.0 g/dL   HCT 32.9 (L) 39.0 - 52.0 %   MCV 87.0 78.0 - 100.0 fL   MCH 26.2 26.0 - 34.0 pg   MCHC 30.1 30.0 - 36.0 g/dL   RDW 18.0 (H) 11.5 - 15.5 %   Platelets 266 150 - 400 K/uL   Neutrophils Relative % 72 %   Neutro Abs 12.5 (H) 1.7 - 7.7 K/uL   Lymphocytes Relative 16 %   Lymphs Abs 2.7 0.7 - 4.0 K/uL   Monocytes Relative 9 %   Monocytes Absolute 1.5 (H) 0.1 - 1.0 K/uL   Eosinophils Relative 3 %   Eosinophils Absolute 0.5 0.0 - 0.7 K/uL   Basophils Relative 0 %   Basophils Absolute 0.0 0.0 - 0.1 K/uL  Glucose, capillary     Status: None   Collection Time: 06/06/15  8:19 AM  Result Value Ref Range   Glucose-Capillary 91 65 - 99 mg/dL    Current Facility-Administered Medications  Medication Dose Route Frequency Provider Last Rate Last Dose  . acetaminophen (TYLENOL) solution 650 mg  650 mg Oral Q6H PRN Javier Glazier, MD      . acetaminophen (TYLENOL) suppository 650 mg  650 mg Rectal Q6H PRN Rigoberto Noel, MD   650 mg at 06/04/15 2218  . antiseptic oral rinse solution (CORINZ)  7 mL Mouth Rinse QID Rush Farmer, MD   7 mL at 06/06/15 0242  . chlorhexidine gluconate (SAGE KIT) (PERIDEX) 0.12 % solution 15 mL  15 mL Mouth Rinse BID Rush Farmer, MD   15 mL at 06/06/15 0816  . clonazePAM (KLONOPIN) tablet 0.5 mg  0.5 mg Oral BID Javier Glazier, MD   0.5 mg at 06/05/15 2230  .  dextrose 5 % and 0.2 % NaCl infusion   Intravenous Continuous Javier Glazier, MD 125 mL/hr at 06/06/15 0827    . ferrous sulfate tablet 325 mg  325 mg Oral BID WC Javier Glazier, MD   Stopped at 06/05/15 0800  . heparin injection 5,000 Units  5,000 Units Subcutaneous Q8H Collene Gobble, MD   5,000 Units at 06/06/15 0539  . insulin aspart (novoLOG) injection 0-20 Units  0-20 Units Subcutaneous TID WC Javier Glazier, MD   0 Units at 06/04/15 0800  . insulin aspart (novoLOG) injection 0-5 Units  0-5 Units Subcutaneous QHS Javier Glazier, MD   0 Units at 06/04/15 2200  . ipratropium-albuterol (DUONEB) 0.5-2.5 (3) MG/3ML nebulizer solution 3 mL  3 mL Nebulization Q2H PRN Chesley Mires, MD      . labetalol (NORMODYNE,TRANDATE) injection 10-20 mg  10-20 mg Intravenous Q2H PRN Rigoberto Noel, MD   10 mg at 06/04/15 2219  . LORazepam (ATIVAN) injection 0.5-1 mg  0.5-1 mg Intravenous Q4H PRN Javier Glazier, MD      . methylPREDNISolone sodium succinate (SOLU-MEDROL) 40 mg/mL injection 40 mg  40 mg Intravenous Daily Javier Glazier, MD   40 mg at 06/05/15 1059  . OLANZapine zydis (ZYPREXA) disintegrating tablet 5 mg  5 mg Oral BID Ambrose Finland, MD   5 mg at 06/05/15 2230  . ondansetron (ZOFRAN) injection 4 mg  4 mg Intravenous Q6H PRN Collene Gobble, MD   4 mg at 06/05/15 2148  . pantoprazole (PROTONIX) injection 40 mg  40 mg Intravenous Q24H Javier Glazier, MD   40 mg at 06/05/15 0810  . phenol (CHLORASEPTIC) mouth spray 1 spray  1 spray Mouth/Throat PRN Javier Glazier, MD      . polyethylene glycol [facility] / GLYCOLAX) packet 17 g  17 g Per Tube Daily PRN Jake Church Masters, [facility]      . RESOURCE THICKENUP CLEAR   Oral PRN Rush Farmer, MD      . sodium chloride flush (NS) 0.9 % injection 10-40 mL  10-40 mL Intracatheter Q12H Colbert Coyer, MD   30 mL at 06/06/15 0524  . sodium chloride flush (NS) 0.9 % injection 10-40 mL  10-40 mL Intracatheter PRN Colbert Coyer, MD   10 mL at 06/04/15 0356  . vitamin C (ASCORBIC ACID) tablet 250 mg  250 mg Oral BID WC Javier Glazier, MD   Stopped at 06/05/15 0800  . white petrolatum (VASELINE) gel   Topical PRN Rigoberto Noel, MD   0.2 application at 17/00/17 1508    Musculoskeletal: Strength & Muscle Tone: decreased Gait & Station: unable to stand Patient leans: N/A  Psychiatric Specialty Exam: Physical Exam  Review of Systems  Unable to perform ROS    Blood pressure 110/62, pulse 95, temperature 98.5 F (36.9 C), temperature source Oral, resp. rate 19, height _0  (1.753 m), weight 83.4 kg (183 lb 13.8 oz), SpO2 96 %.Body mass index is 27.14 kg/(m^2).  General Appearance: Disheveled and Guarded  Eye Contact:  Fair  Speech:  nods his head for yes or no and not consistant.  Volume:  not applicable  Mood:  Depressed  Affect:  Depressed and Flat  Thought Process:  NA  Orientation:  Negative  Thought Content:  NA  Suicidal Thoughts:  No  Homicidal Thoughts:  No  Memory:  Immediate;   Poor Recent;   Poor  Judgement:  Poor  Insight:  Shallow  Psychomotor Activity:  Psychomotor Retardation  Concentration:  Concentration: Poor  Recall:  Poor  Fund of Knowledge:  Poor  Language:  Fair  Akathisia:  No  Handed:  Right  AIMS (if indicated):     Assets:  Others:  differed  ADL's:  Impaired  Cognition:  Impaired,  Severe  Sleep:        Treatment Plan Summary: Review of latest EKG indicates his QTC 436. Case discussed with Dr. Ashok Cordia and staff RN Continue Zyprexa Zydis 5 mg POBID for mood and poor appetite and sleep. Appreciate psychiatric consultation and follow up as clinically required Please contact [phone] or [phone] if needs further assistance  Disposition: Patient does not meet criteria for psychiatric inpatient admission. Supportive therapy  provided about ongoing stressors.  Ambrose Finland, MD 06/06/2015 9:37 AM

## 2015-06-07 ENCOUNTER — Encounter (HOSPITAL_COMMUNITY): Payer: Self-pay

## 2015-06-07 ENCOUNTER — Inpatient Hospital Stay (HOSPITAL_COMMUNITY): Payer: Medicare Other

## 2015-06-07 LAB — COMPREHENSIVE METABOLIC PANEL
ALBUMIN: 2.5 g/dL — AB (ref 3.5–5.0)
ALK PHOS: 39 U/L (ref 38–126)
ALT: 41 U/L (ref 17–63)
ANION GAP: 9 (ref 5–15)
AST: 46 U/L — AB (ref 15–41)
BUN: 16 mg/dL (ref 6–20)
CO2: 30 mmol/L (ref 22–32)
Calcium: 8.6 mg/dL — ABNORMAL LOW (ref 8.9–10.3)
Chloride: 103 mmol/L (ref 101–111)
Creatinine, Ser: 0.93 mg/dL (ref 0.61–1.24)
GFR calc Af Amer: >60 mL/min (ref >60)
GFR calc non Af Amer: >60 mL/min (ref >60)
GLUCOSE: 101 mg/dL — AB (ref 65–99)
POTASSIUM: 3.2 mmol/L — AB (ref 3.5–5.1)
SODIUM: 142 mmol/L (ref 135–145)
Total Bilirubin: 0.5 mg/dL (ref 0.3–1.2)
Total Protein: 5.4 g/dL — ABNORMAL LOW (ref 6.5–8.1)

## 2015-06-07 LAB — CBC
HEMATOCRIT: 30.5 % — AB (ref 39.0–52.0)
HEMOGLOBIN: 9.2 g/dL — AB (ref 13.0–17.0)
MCH: 25.8 pg — AB (ref 26.0–34.0)
MCHC: 30.2 g/dL (ref 30.0–36.0)
MCV: 85.4 fL (ref 78.0–100.0)
Platelets: 218 10*3/uL (ref 150–400)
RBC: 3.57 MIL/uL — ABNORMAL LOW (ref 4.22–5.81)
RDW: 17.4 % — ABNORMAL HIGH (ref 11.5–15.5)
WBC: 12.2 10*3/uL — ABNORMAL HIGH (ref 4.0–10.5)

## 2015-06-07 LAB — GLUCOSE, CAPILLARY
Glucose-Capillary: 100 mg/dL — ABNORMAL HIGH (ref 65–99)
Glucose-Capillary: 111 mg/dL — ABNORMAL HIGH (ref 65–99)
Glucose-Capillary: 117 mg/dL — ABNORMAL HIGH (ref 65–99)
Glucose-Capillary: 118 mg/dL — ABNORMAL HIGH (ref 65–99)

## 2015-06-07 MED ORDER — POTASSIUM CL IN DEXTROSE 5% 20 MEQ/L IV SOLN
20.0000 meq | INTRAVENOUS | Status: DC
  Administered 2015-06-07 – 2015-06-10 (×4): 20 meq via INTRAVENOUS
  Filled 2015-06-07 (×8): qty 1000

## 2015-06-07 MED ORDER — POTASSIUM CHLORIDE CRYS ER 20 MEQ PO TBCR
40.0000 meq | EXTENDED_RELEASE_TABLET | Freq: Once | ORAL | Status: AC
  Administered 2015-06-07: 40 meq via ORAL
  Filled 2015-06-07: qty 2

## 2015-06-07 NOTE — Progress Notes (Signed)
[place] TEAM 1 - Stepdown/ICU TEAM  Pradeep Beaubrun  EXN:[id number] DOB: Nov 14, 1975 DOA: 05/16/2015 PCP: Wallene Dales, MD    Brief Narrative:  40 y.o. M with h/o autism and sickle cell trait who previously underwent a prolonged hospitalization for PNA with ARDS s/p tracheostomy then eventual decannulaton w/ transfer to rehab. He developed progressive dyspnea and hypoxia leading to respiratory failure from MRSA pneumonia, and had to be readmitted to the acute hospital on 05/16/15.   Significant Events: 5/05 Tx from rehab to [facility] with respiratory failure 5/09 To ICU > VDRF 5/18 Transfuse PRBC, changed to diprivan 5/19 Added solumedrol 5/20 Transfuse PRBC  Assessment & Plan:  Acute Hypoxic Respiratory Failure due to MRSA Pneumonia and pulmonary edema  Solu-Medrol 63m IV daily on a gradual wean over 2 weeks - has completed a course of abx tx - afebrile w/ WBC on decline    Septic Shock due to PNA resolved  Acute Renal Failure Resolved - baseline creatinine 1.14 on 05/12/15  Recent Labs Lab 06/05/15 0503 06/05/15 1825 06/06/15 0537 06/06/15 1614 06/07/15 0514  CREATININE 0.96 0.92 1.12 1.14 0.93    Hypernatremia Resolved w/ increased free water   Recent Labs Lab 06/04/15 1646 06/05/15 0503 06/05/15 1825 06/06/15 0537 06/06/15 1614 06/07/15 0514  NA 151* 151* 148* 149* 144 142    Hypokalemia  Replace and follow   Recent Labs Lab 06/05/15 1825 06/06/15 0537 06/06/15 1614 06/07/15 0514  K 3.7 3.3* 3.7 3.2*    Hypomagnesemia  Replace and follow   Recent Labs Lab 06/03/15 0350 06/04/15 0414 06/05/15 0503 06/06/15 0537  MG 2.2 2.0 1.8 1.6*    Dysphagia Dysphagia 1 diet w/ thin liquids - has been intermittently refusing to eat - SLP following - may need PEG tube if continues to refuse to swallow  Anemia of Chronic critical illness  Transfuse for Hb < 7 - stable presently   Recent Labs Lab 06/03/15 0350 06/04/15 0414 06/05/15 0503 06/06/15 0537  06/07/15 0514  HGB 9.4* 9.6* 10.4* 9.9* 9.2*    H/O sickle cell trait  Hyperglycemia On steroids and dextrose IVF - A1c pending   Acute Encephalopathy vs Depression  Psych has evaluated - cont zprexa per Psych   Physical Deconditioning  H/O Autism - Mental impairment Reportedly has a very limited vocabulary (~500 words)  DVT prophylaxis: SQ heparin  Code Status: FULL CODE Family Communication: no family present at time of exam - spoke w/ sister via phone  Disposition Plan: transfer to med bed - PT/OT - ?return to CIR   Consultants:  PCCM Psychiatry   Antimicrobials:  5/02 Levaquin >> 5/03 5/04 Zosyn >> 5/07 5/04 Vancomycin >> 5/17 5/07 Rocephin >> 5/09 5/09 FTressie Ellis>> 5/16  Subjective: More alert and interactive today.  Dials the phone to call his sister and gives it to me.  Does not appear to be uncomfortable or in any distress.   Objective: Blood pressure 119/72, pulse 103, temperature 98.7 F (37.1 C), temperature source Oral, resp. rate 0, height 5' 9" (1.753 m), weight 85.7 kg (188 lb 15 oz), SpO2 99 %.  Intake/Output Summary (Last 24 hours) at 06/07/15 1527 Last data filed at 06/07/15 1400  Gross per 24 hour  Intake 4186.25 ml  Output   2050 ml  Net 2136.25 ml   Filed Weights   06/05/15 1732 06/06/15 0500 06/07/15 0500  Weight: 83.8 kg (184 lb 11.9 oz) 83.4 kg (183 lb 13.8 oz) 85.7 kg (188 lb 15 oz)  Examination: General: No acute respiratory distress - alert  Lungs: Mild bibasilar crackles - no wheeze Cardiovascular: Regular rate and rhythm without murmur gallop or rub  Abdomen: Nontender, nondistended, soft, bowel sounds positive, no rebound, no ascites, no appreciable mass Extremities: No significant cyanosis, clubbing, edema bilateral lower extremities  CBC:  Recent Labs Lab 06/03/15 0350 06/04/15 0414 06/05/15 0503 06/06/15 0537 06/07/15 0514  WBC 22.2* 18.7* 19.6* 17.2* 12.2*  NEUTROABS 17.8* 15.0* 15.0* 12.5*  --   HGB 9.4* 9.6*  10.4* 9.9* 9.2*  HCT 30.6* 32.2* 35.7* 32.9* 30.5*  MCV 86.0 87.0 89.0 87.0 85.4  PLT 492* 366 315 266 830   Basic Metabolic Panel:  Recent Labs Lab 06/03/15 0350  06/04/15 0414  06/05/15 0503 06/05/15 1825 06/06/15 0537 06/06/15 1614 06/07/15 0514  NA 156*  < > 156*  < > 151* 148* 149* 144 142  K 3.3*  < > 3.3*  < > 2.9* 3.7 3.3* 3.7 3.2*  CL 112*  < > 113*  < > 108 108 108 107 103  CO2 32  < > 36*  < > 36* 34* 34* 33* 30  GLUCOSE 123*  < > 131*  < > 133* 129* 109* 126* 101*  BUN 31*  < > 25*  < > _0 CREATININE 1.07  < > 0.96  < > 0.96 0.92 1.12 1.14 0.93  CALCIUM 9.5  < > 9.1  < > 8.7* 8.7* 8.6* 8.3* 8.6*  MG 2.2  --  2.0  --  1.8  --  1.6*  --   --   PHOS 3.9  --  3.0  --  2.9  --  3.0  --   --   < > = values in this interval not displayed. GFR: Estimated Creatinine Clearance: 116.8 mL/min (by C-G formula based on Cr of 0.93).  Liver Function Tests:  Recent Labs Lab 06/02/15 1345 06/03/15 0350 06/04/15 0414 06/05/15 0503 06/06/15 0537 06/07/15 0514  AST 27  --   --   --   --  46*  ALT 23  --   --   --   --  41  ALKPHOS 43  --   --   --   --  39  BILITOT 0.6  --   --   --   --  0.5  PROT 6.1*  --   --   --   --  5.4*  ALBUMIN 2.5* 2.8* 2.7* 2.6* 2.6* 2.5*    Recent Labs Lab 06/02/15 1345  LIPASE 98*  AMYLASE 132*    CBG:  Recent Labs Lab 06/06/15 1233 06/06/15 1728 06/06/15 2108 06/07/15 0829 06/07/15 1207  GLUCAP 128* 125* 94 100* 111*    Recent Results (from the past 240 hour(s))  Rapid strep screen (not at [facility])     Status: None   Collection Time: 06/05/15 10:10 PM  Result Value Ref Range Status   Streptococcus, Group A Screen (Direct) NEGATIVE NEGATIVE Final    Comment: (NOTE) A Rapid Antigen test may result negative if the antigen level in the sample is below the detection level of this test. The FDA has not cleared this test as a stand-alone test therefore the rapid antigen negative result has reflexed to a Group A Strep  culture.   Culture, group A strep     Status: None   Collection Time: 06/05/15 10:10 PM  Result Value Ref Range Status   Specimen Description THROAT  Final  Special Requests NONE Reflexed from [id number]  Final   Culture FEW STREPTOCOCCUS,BETA HEMOLYTIC NOT GROUP A  Final   Report Status 06/06/2015 FINAL  Final  C difficile quick scan w PCR reflex     Status: None   Collection Time: 06/06/15  1:20 PM  Result Value Ref Range Status   C Diff antigen NEGATIVE NEGATIVE Final   C Diff toxin NEGATIVE NEGATIVE Final   C Diff interpretation Negative for toxigenic C. difficile  Final     Scheduled Meds: . antiseptic oral rinse  7 mL Mouth Rinse QID  . chlorhexidine gluconate (SAGE KIT)  15 mL Mouth Rinse BID  . clonazePAM  0.5 mg Oral QHS  . ferrous sulfate  325 mg Oral BID WC  . heparin subcutaneous  5,000 Units Subcutaneous Q8H  . insulin aspart  0-20 Units Subcutaneous TID WC  . insulin aspart  0-5 Units Subcutaneous QHS  . methylPREDNISolone (SOLU-MEDROL) injection  40 mg Intravenous Daily  . OLANZapine zydis  5 mg Oral BID  . pantoprazole  40 mg Oral Daily  . vitamin C  250 mg Oral BID WC   Continuous Infusions: . dextrose 5 % with KCl 20 mEq / L 20 mEq (06/06/15 1757)     LOS: 22 days   Time spent: 35 minutes   Cherene Altes, MD [facility] Office  [phone] Pager - Text Page per Shea Evans as per below:  On-Call/Text Page:      [email or web address]      password [email or web address]  If 7PM-7AM, please contact night-coverage [email or web address] Password [facility] 06/07/2015, 3:27 PM

## 2015-06-07 NOTE — Progress Notes (Signed)
Report called to RN 5W 

## 2015-06-08 LAB — GLUCOSE, CAPILLARY
GLUCOSE-CAPILLARY: 88 mg/dL (ref 65–99)
GLUCOSE-CAPILLARY: 115 mg/dL — AB (ref 65–99)
GLUCOSE-CAPILLARY: 135 mg/dL — AB (ref 65–99)
GLUCOSE-CAPILLARY: 92 mg/dL (ref 65–99)

## 2015-06-08 NOTE — Progress Notes (Signed)
[facility]                                                                              Patient Demographics  Juan Cameron, is a 40 y.o. male, DOB - 08-09-1975, IRJ:[id number]  Admit date - 05/16/2015   Admitting Physician Collene Gobble, MD  Outpatient Primary MD for the patient is ASRES,ALEHEGN, MD  Outpatient specialists:   LOS - 23  days    No chief complaint on file.      Brief summary   40 y.o. M with h/o autism and sickle cell trait who previously underwent a prolonged hospitalization for PNA with ARDS s/p tracheostomy then eventual decannulaton w/ transfer to rehab. He developed progressive dyspnea and hypoxia leading to respiratory failure from MRSA pneumonia, and had to be readmitted to the acute hospital on 05/16/15.   Significant Events: 5/05 Tx from rehab to [facility] with respiratory failure 5/09 To ICU > VDRF 5/18 Transfuse PRBC, changed to diprivan 5/19 Added solumedrol 5/20 Transfuse PRBC   Assessment & Plan   Acute Hypoxic Respiratory Failure due to MRSA Pneumonia and pulmonary edema  - Solu-Medrol 55m IV daily on a gradual wean over 2 weeks, transitioned to oral prednisone in a.m.  - has completed a course of abx - afebrile w/ WBC on decline   Septic Shock due to PNA resolved  Acute Renal Failure - Resolved  Hypernatremia - Improved  Hypokalemia, hypomagnesemia - Replace potassium - Check magnesium in a.m.   Dysphagia - Continue Dysphagia 1 diet w/ thin liquids  - has been intermittently refusing to eat - SLP following  - may need PEG tube if continues to refuse to swallow  Anemia - Likely anemia of chronic disease, hemoglobin currently stable   Hyperglycemia - On steroids and dextrose IVF -  A1c pending   Acute Encephalopathy vs Depression  Psych has evaluated - cont zyprexa per Psych   Physical Deconditioning - PTOT evaluation, return to CIR  H/O Autism - Mental impairment Reportedly has a very limited vocabulary  (~500 words)  Code Status: full   DVT Prophylaxis:   heparin    Family Communication: No family member at the bedside.  Disposition Plan: Medically stable, return back to CIR  Time Spent in minutes  15 minutes  Procedures:  None  Consults :   PC CM Psychiatry    Antimicrobials  5/02 Levaquin >> 5/03 5/04 Zosyn >> 5/07 5/04 Vancomycin >> 5/17 5/07 Rocephin >> 5/09 5/09 FTressie Ellis>> 5/16  Medications  Scheduled Meds: . antiseptic oral rinse  7 mL Mouth Rinse QID  . chlorhexidine gluconate (SAGE KIT)  15 mL Mouth Rinse BID  . clonazePAM  0.5 mg Oral QHS  . ferrous sulfate  325 mg Oral BID WC  . heparin subcutaneous  5,000 Units Subcutaneous Q8H  . insulin aspart  0-20 Units Subcutaneous TID WC  . insulin aspart  0-5 Units Subcutaneous QHS  . methylPREDNISolone (SOLU-MEDROL) injection  40 mg Intravenous Daily  . OLANZapine zydis  5 mg Oral BID  . pantoprazole  40 mg Oral Daily  . vitamin C  250 mg Oral BID WC  Continuous Infusions: . dextrose 5 % with KCl 20 mEq / L 20 mEq (06/08/15 0426)   PRN Meds:.acetaminophen (TYLENOL) oral liquid 160 mg/5 mL, acetaminophen, ipratropium-albuterol, labetalol, ondansetron (ZOFRAN) IV, phenol, polyethylene glycol, RESOURCE THICKENUP CLEAR, sodium chloride flush, white petrolatum   Antibiotics   Anti-infectives    Start     Dose/Rate Route Frequency Ordered Stop   05/23/15 1800  vancomycin (VANCOCIN) IVPB 1000 mg/200 mL premix     1,000 mg 200 mL/hr over 60 Minutes Intravenous Every 24 hours 05/23/15 1450 05/28/15 1835   05/20/15 2100  cefTAZidime (FORTAZ) 1 g in dextrose 5 % 50 mL IVPB  Status:  Discontinued     1 g 100 mL/hr over 30 Minutes Intravenous Every 8 hours 05/20/15 2028 05/27/15 0918   05/20/15 1400  vancomycin (VANCOCIN) 1,500 mg in sodium chloride 0.9 % 500 mL IVPB  Status:  Discontinued     1,500 mg 250 mL/hr over 120 Minutes Intravenous Every 24 hours 05/20/15 1354 05/23/15 1450   05/19/15 1800  vancomycin  (VANCOCIN) IVPB 1000 mg/200 mL premix  Status:  Discontinued     1,000 mg 200 mL/hr over 60 Minutes Intravenous Every 12 hours 05/19/15 0935 05/19/15 1103   05/19/15 1200  cefTRIAXone (ROCEPHIN) 1 g in dextrose 5 % 50 mL IVPB  Status:  Discontinued     1 g 100 mL/hr over 30 Minutes Intravenous Every 24 hours 05/19/15 1103 05/20/15 1351   05/16/15 1400  piperacillin-tazobactam (ZOSYN) IVPB 3.375 g  Status:  Discontinued     3.375 g 12.5 mL/hr over 240 Minutes Intravenous Every 8 hours 05/16/15 1327 05/19/15 1103   05/16/15 1400  vancomycin (VANCOCIN) IVPB 1000 mg/200 mL premix  Status:  Discontinued     1,000 mg 200 mL/hr over 60 Minutes Intravenous Every 8 hours 05/16/15 1327 05/18/15 0506        Subjective:   Juan Cameron was seen and examined today.  Patient refuses to be examined or talk. Otherwise appears to be comfortable and keeps pulling the blanket on his face. Does not appear to be in any pain, afebrile.   Objective:   Filed Vitals:   06/07/15 1800 06/07/15 1845 06/07/15 2056 06/08/15 0550  BP: 134/96 131/80 128/80   Pulse: 108 109 95   Temp:  98.4 F (36.9 C) 99.4 F (37.4 C)   TempSrc:      Resp: 0 19 18   Height:      Weight:    80 kg (176 lb 5.9 oz)  SpO2: 98% 100% 100%     Intake/Output Summary (Last 24 hours) at 06/08/15 1019 Last data filed at 06/08/15 8676  Gross per 24 hour  Intake 3334.58 ml  Output   3700 ml  Net -365.42 ml     Wt Readings from Last 3 Encounters:  06/08/15 80 kg (176 lb 5.9 oz)  05/16/15 91.536 kg (201 lb 12.8 oz)  04/07/15 104.8 kg (231 lb 0.7 oz)     Exam  General: Alert and awake, keeps on pulling the blanket to his face  ENT:    Neck:   Cardiovascular: Unable to examine   Respiratory: Unable to examine  Gastrointestinal: Unable to examine   Ext: unable to examine   Neuro: Moving all 4 extremities  skin: No rashes  Psych: alert and awake, refuses to be examined, keeps on pulling the blanket to his face or  the sides    Data Reviewed:  I have personally reviewed following labs and  imaging studies  Micro Results Recent Results (from the past 240 hour(s))  Rapid strep screen (not at [facility])     Status: None   Collection Time: 06/05/15 10:10 PM  Result Value Ref Range Status   Streptococcus, Group A Screen (Direct) NEGATIVE NEGATIVE Final    Comment: (NOTE) A Rapid Antigen test may result negative if the antigen level in the sample is below the detection level of this test. The FDA has not cleared this test as a stand-alone test therefore the rapid antigen negative result has reflexed to a Group A Strep culture.   Culture, group A strep     Status: None   Collection Time: 06/05/15 10:10 PM  Result Value Ref Range Status   Specimen Description THROAT  Final   Special Requests NONE Reflexed from [id number]  Final   Culture FEW STREPTOCOCCUS,BETA HEMOLYTIC NOT GROUP A  Final   Report Status 06/06/2015 FINAL  Final  C difficile quick scan w PCR reflex     Status: None   Collection Time: 06/06/15  1:20 PM  Result Value Ref Range Status   C Diff antigen NEGATIVE NEGATIVE Final   C Diff toxin NEGATIVE NEGATIVE Final   C Diff interpretation Negative for toxigenic C. difficile  Final    Radiology Reports Dg Chest 2 View  05/14/2015  CLINICAL DATA:  Shortness of breath.  Cough. EXAM: CHEST  2 VIEW COMPARISON:  05/13/2015. FINDINGS: Mediastinum and hilar structures normal. Progressive diffuse bilateral pulmonary infiltrates. Heart size normal. No pleural effusion or pneumothorax. IMPRESSION: 1. Diffuse bilateral pulmonary infiltrates, progressive from prior exam. 2.  Heart size stable . Electronically Signed   By: Marcello Moores  Register   On: 05/14/2015 08:15   US Abdomen Complete  06/02/2015  CLINICAL DATA:  Abdominal pain EXAM: ABDOMEN ULTRASOUND COMPLETE COMPARISON:  None FINDINGS: Gallbladder: Normally distended without stones or wall thickening. No pericholecystic fluid or sonographic Murphy sign. Common  bile duct: Diameter: Normal caliber 5 mm diameter Liver: New normal appearance IVC: Normal appearance Pancreas: Obscured by bowel gas Spleen: Normal appearance, 7.0 cm length Right Kidney: Length: 10.8 cm. Normal morphology without mass or hydronephrosis. Left Kidney: Length: 11.9 cm. Normal morphology without mass or hydronephrosis. Abdominal aorta: Normal caliber Other findings: No free fluid IMPRESSION: Nonvisualization of pancreas, obscured by bowel gas. Remainder of exam normal. Electronically Signed   By: Lavonia Dana M.D.   On: 06/02/2015 15:29   Dg Chest Port 1 View  06/07/2015  CLINICAL DATA:  Pneumonia EXAM: PORTABLE CHEST 1 VIEW COMPARISON:  06/01/2015 FINDINGS: Right PICC line tip:  Upper cavoatrial junction. Very low lung volumes. Mild enlargement of the cardiopericardial silhouette. In addition to the vascular crowding, there is additional indistinctness of the pulmonary vasculature, mild interstitial accentuation, and indistinct bilobed basilar airspace opacities. The retrocardiac airspace opacities stable. The right basilar airspace opacity appears slightly improved compared to 06/01/2015. IMPRESSION: 1. Continued bibasilar airspace opacities, potentially from atelectasis or pneumonia. However, the right basilar airspace opacity appears mildly improved compared to 6 days ago. 2. Very low lung volumes causing vascular crowding. There is also some pulmonary vascular indistinctness which could be from mild edema or atypical pneumonia. 3. Stable enlargement of the cardiopericardial silhouette. 4. Right PICC line tip:  Upper cavoatrial junction. Electronically Signed   By: Van Clines M.D.   On: 06/07/2015 08:29   Dg Chest Port 1 View  06/01/2015  CLINICAL DATA:  Extubation.  Followup pneumonia/ ARDS. EXAM: PORTABLE CHEST 1 VIEW COMPARISON:  05/31/2015 and  earlier. FINDINGS: Right arm PICC tip projects over the mid SVC, unchanged. Interval extubation and nasogastric tube removal. Cardiac  silhouette upper normal in size to slightly enlarged for technique and degree of inspiration, unchanged. Patchy airspace opacities throughout both lungs, unchanged. Possible bilateral effusions, unchanged. No new pulmonary parenchymal abnormalities. IMPRESSION: 1. Support apparatus satisfactory. 2. Stable patchy pneumonia and/or ARDS throughout both lungs. 3. Stable small bilateral effusions. 4. No new abnormalities. Electronically Signed   By: Evangeline Dakin M.D.   On: 06/01/2015 10:16   Dg Chest Port 1 View  05/31/2015  CLINICAL DATA:  Respiratory failure EXAM: PORTABLE CHEST 1 VIEW COMPARISON:  Chest radiograph from one day prior. FINDINGS: Endotracheal tube tip is 2.0 cm above the carina. Enteric tube enters stomach with the tip not seen on this image. Right PICC terminates in the lower third of the superior vena cava. Stable cardiomediastinal silhouette with normal heart size. No pneumothorax. No pleural effusion. Low lung volumes. Patchy opacities throughout both lungs are not appreciably changed. IMPRESSION: 1. Support structures as described . 2. Low lung volumes with stable patchy opacities throughout both lungs, probably representing either multifocal pneumonia and/or ARDS. Electronically Signed   By: Ilona Sorrel M.D.   On: 05/31/2015 10:39   Dg Chest Port 1 View  05/30/2015  CLINICAL DATA:  Respiratory failure, healthcare associated pneumonia, CHF, acute renal failure. EXAM: PORTABLE CHEST 1 VIEW COMPARISON:  Portable chest x-ray of May 29, 2015 FINDINGS: The lungs remain hypoinflated. The interstitial markings remain increased with areas of coarse interstitial density in the lower lung zones. The cardiac silhouette remains enlarged. The pulmonary vascularity remains prominent centrally. The endotracheal tube tip lies 2 point 5 cm above the carina. The esophagogastric tube tip projects below the inferior margin of the image. The right-sided PICC line tip projects over the midportion of the SVC.  IMPRESSION: There has not been significant interval change in the appearance of the chest since yesterday's study. Persistent airspace opacities in both lungs are consistent with edema and/or pneumonia. Electronically Signed   By: David  Martinique M.D.   On: 05/30/2015 07:28   Dg Chest Port 1 View  05/29/2015  CLINICAL DATA:  Respiratory failure.  Shortness of breath. EXAM: PORTABLE CHEST 1 VIEW COMPARISON:  05/28/2015. FINDINGS: Endotracheal tube, NG tube, right PICC line in stable position. Heart size stable. Diffuse bilateral airspace disease is again noted and unchanged. No pleural effusion or pneumothorax. IMPRESSION: 1. Lines and tubes in stable position. 2.  Diffuse bilateral airspace disease.  No interim change. Electronically Signed   By: Marcello Moores  Register   On: 05/29/2015 07:07   Dg Chest Port 1 View  05/28/2015  CLINICAL DATA:  Respiratory failure, healthcare associated pneumonia, chronic CHF, acute renal failure. EXAM: PORTABLE CHEST 1 VIEW COMPARISON:  Portable chest x-ray dated May 27, 2015. FINDINGS: The lungs remain mildly hypoinflated. Confluent interstitial and alveolar opacities are present and slightly more conspicuous today. No significant pleural effusion is observed and there is no pneumothorax. The cardiac silhouette is enlarged. The pulmonary vascularity is engorged. The endotracheal tube tip lies 3 cm above the carina. The esophagogastric tube tip projects below the inferior margin of the image. The right-sided PICC line tip projects over the midportion of the SVC. IMPRESSION: Slight interval deterioration in the appearance of the pulmonary interstitium consistent with pulmonary edema or bilateral pneumonia. The support tubes are in stable position. Electronically Signed   By: David  Martinique M.D.   On: 05/28/2015 07:48   Dg Chest  Port 1 View  05/27/2015  CLINICAL DATA:  Respiratory failure. EXAM: PORTABLE CHEST 1 VIEW COMPARISON:  05/26/2015. FINDINGS: Endotracheal tube, NG tube, right  PICC line stable position. Heart size stable. Diffuse bilateral pulmonary infiltrates and or edema again noted and unchanged. No pleural effusion or pneumothorax. IMPRESSION: 1. Lines and tubes in stable position. 2.  Stable cardiomegaly. 3. Diffuse bilateral pulmonary infiltrates and or edema again noted. No interim change. Electronically Signed   By: Marcello Moores  Register   On: 05/27/2015 07:20   Dg Chest Port 1 View  05/26/2015  CLINICAL DATA:  Respiratory failure. EXAM: PORTABLE CHEST 1 VIEW COMPARISON:  05/25/2015. FINDINGS: Endotracheal tube, NG tube, right PICC line in stable position. Stable cardiomegaly. Diffuse bilateral pulmonary infiltrates and/or edema again noted, no interim change. No prominent pleural effusion or pneumothorax . Bowel distention noted. Process such as ileus cannot be excluded. Abdominal series can be obtained for further evaluation . IMPRESSION: 1. Lines and tubes in stable position. 2.  Stable cardiomegaly. 3. Diffuse unchanged bilateral pulmonary infiltrates and or edema. 3. Bowel distention noted. A process such as an ileus cannot be excluded. Abdominal series can be obtained for further evaluation. Electronically Signed   By: Marcello Moores  Register   On: 05/26/2015 07:14   Dg Chest Port 1 View  05/25/2015  CLINICAL DATA:  Acute respiratory failure EXAM: PORTABLE CHEST 1 VIEW COMPARISON:  05/24/2015 FINDINGS: Cardiomegaly again noted. Stable endotracheal and NG tube position. Stable right arm PICC line position. Again noted patchy multifocal bilateral airspace disease and mild interstitial prominence with slight worsening in aeration in right lung. Findings suspicious for worsening pneumonia or pulmonary edema. IMPRESSION: Stable support apparatus.Again noted patchy multifocal bilateral airspace disease and mild interstitial prominence with slight worsening in aeration in right lung. Findings suspicious for worsening pneumonia or pulmonary edema. Electronically Signed   By: Lahoma Crocker  M.D.   On: 05/25/2015 09:01   Dg Chest Port 1 View  05/24/2015  CLINICAL DATA:  40 year old male with history of acute respiratory failure. EXAM: PORTABLE CHEST 1 VIEW COMPARISON:  Chest x-ray 05/23/2015. FINDINGS: An endotracheal tube is in place with tip 2.2 cm above the carina. There is a right upper extremity PICC with tip terminating in the mid superior vena cava. Lung volumes are low. Patchy interstitial and airspace disease throughout the lungs bilaterally. Crowding of the pulmonary vasculature. Heart size is borderline enlarged, likely accentuated by the low lung volumes. Trace bilateral pleural effusions. No pneumothorax. Upper mediastinal contours are within normal limits. IMPRESSION: 1. Support apparatus, as above. 2. Low lung volumes with patchy multifocal interstitial and airspace disease. Findings are relatively symmetric, which could indicate some underlying pulmonary edema, however, findings may alternatively simply reflect multilobar bronchopneumonia. 3. Trace bilateral pleural effusions. Electronically Signed   By: Vinnie Langton M.D.   On: 05/24/2015 11:01   Dg Chest Port 1 View  05/23/2015  CLINICAL DATA:  Hypoxia EXAM: PORTABLE CHEST 1 VIEW COMPARISON:  May 22, 2015 FINDINGS: Endotracheal tube tip is 3.6 cm above carina. Central catheter tip is in the superior vena cava. Nasogastric tube tip and side port are below the diaphragm. No pneumothorax. There is patchy airspace consolidation in both lower lobes. There is a small right effusion. Heart is mildly enlarged with pulmonary venous hypertension. No adenopathy evident. IMPRESSION: Tube and catheter positions as described without pneumothorax. Evidence of pulmonary venous hypertension. Airspace opacity in the bases is likely due to bout lateral pneumonia, although there may be a degree of edema  in these areas. Both entities may exist concurrently. Electronically Signed   By: Lowella Grip III M.D.   On: 05/23/2015 07:10   Dg Chest  Port 1 View  05/22/2015  CLINICAL DATA:  Acute respiratory failure EXAM: PORTABLE CHEST 1 VIEW COMPARISON:  05/21/2015 FINDINGS: Cardiac shadow is stable. A right-sided PICC line is in the distal superior vena cava. The overall inspiratory effort is poor. Diffuse bilateral basilar infiltrate/atelectasis are seen. This is increased somewhat in the interval from the prior exam likely related to the poor inspiratory effort. No other focal abnormality is seen. IMPRESSION: Increasing bibasilar changes likely related to the poor inspiratory effort. Electronically Signed   By: Inez Catalina M.D.   On: 05/22/2015 07:21   Dg Chest Port 1 View  05/21/2015  CLINICAL DATA:  40 year old male with respiratory distress. Sickle cell trait. Initial encounter. EXAM: PORTABLE CHEST 1 VIEW COMPARISON:  05/20/2015 and earlier. FINDINGS: Portable AP semi upright view at 0458 hours. Stable endotracheal tube tip in good position between the level the clavicles and carina. Enteric tube has been placed and loops at the level of the stomach in the left upper quadrant. Stable right PICC line. Continued low lung volumes with widespread coarse and confluent bilateral pulmonary opacity -basilar predominant. Stable cardiac size and mediastinal contours. No pneumothorax. No large pleural effusion. Ventilation has not significantly changed since 05/16/2015. IMPRESSION: 1. Enteric tube placed, loops at the level of the stomach with tip not included. 2.  Otherwise, stable lines and tubes. 3. Stable ventilation since 05/16/2015 with widespread coarse and confluent pulmonary opacity. Electronically Signed   By: Genevie Ann M.D.   On: 05/21/2015 07:15   Dg Chest Port 1 View  05/20/2015  CLINICAL DATA:  Status post intubation. EXAM: PORTABLE CHEST 1 VIEW COMPARISON:  05/19/2015 FINDINGS: New endotracheal tube tip projects 2 cm above the carina. Right PICC is stable in well positioned. Bilateral irregular interstitial and patchy airspace opacities are  without significant change from the prior exam. No new lung abnormalities. No obvious pleural effusion and no pneumothorax. IMPRESSION: 1. Endotracheal tube is well positioned. 2. No other change from the prior study. Electronically Signed   By: Lajean Manes M.D.   On: 05/20/2015 15:33   Dg Chest Port 1 View  05/19/2015  CLINICAL DATA:  Respiratory distress.  Decreased oxygen saturations. EXAM: PORTABLE CHEST 1 VIEW COMPARISON:  05/19/2015 FINDINGS: There is a right arm PICC line with tip in the cavoatrial junction. Normal heart size. Bilateral airspace and interstitial opacities are not significantly improved from previous exam. IMPRESSION: 1. No change in aeration to the lungs compared with previous exam. Electronically Signed   By: Kerby Moors M.D.   On: 05/19/2015 21:03   Dg Chest Port 1 View  05/19/2015  CLINICAL DATA:  Dyspnea EXAM: PORTABLE CHEST 1 VIEW COMPARISON:  05/17/2015 FINDINGS: Satisfactorily positioned right upper extremity PICC line. Multifocal airspace opacities persist bilaterally without significant interval change. No pneumothorax. IMPRESSION: No significant interval change in the bilateral airspace opacities. Electronically Signed   By: Andreas Newport M.D.   On: 05/19/2015 02:44   Portable Chest 1 View  05/17/2015  CLINICAL DATA:  Pneumonia EXAM: PORTABLE CHEST 1 VIEW COMPARISON:  May 16, 2015 FINDINGS: Central catheter tip is in the superior vena cava near the cavoatrial junction. No pneumothorax. There is persistent patchy airspace opacity in the mid and lower lung zones bilaterally. Heart is mildly enlarged. The pulmonary vascularity is normal. No adenopathy. IMPRESSION: Suspect bilateral lower lobe  pneumonia, although pulmonary edema could present similarly. There may be a combination of pneumonia and pulmonary edema present. Heart is mildly enlarged. Central catheter tip in superior vena cava near cavoatrial junction. Electronically Signed   By: Lowella Grip III M.D.    On: 05/17/2015 08:43   Dg Chest Port 1 View  05/16/2015  CLINICAL DATA:  Shortness of Breath with worsening today, cough EXAM: PORTABLE CHEST 1 VIEW COMPARISON:  05/15/2015 FINDINGS: Cardiomediastinal silhouette is stable. Persistent mild interstitial prominence suspicious for mild interstitial edema. Again noted bilateral basilar atelectasis or infiltrate. There is right arm PICC line with tip in right atrium. For distal SVC position the PICC line should be retracted about 2.1 cm. No pneumothorax. IMPRESSION: Persistent mild interstitial prominence suspicious for mild interstitial edema. Again noted bilateral basilar atelectasis or infiltrate. There is right arm PICC line with tip in right atrium. For distal SVC position the PICC line should be retracted about 2.1 cm. Electronically Signed   By: Lahoma Crocker M.D.   On: 05/16/2015 10:41   Dg Chest Port 1 View  05/15/2015  CLINICAL DATA:  Acute respiratory failure, community-acquired pneumonia, ARDS. EXAM: PORTABLE CHEST 1 VIEW COMPARISON:  PA and lateral chest x-ray of May 14, 2015 FINDINGS: The lungs are slightly less well inflated today. There are persistent coarse lung markings with confluent alveolar opacities at the lung bases. Small amounts of pleural fluid blunt the costophrenic angles. The cardiac silhouette is enlarged. The pulmonary vascularity is engorged and indistinct. The observed bony thorax exhibits no acute abnormality. IMPRESSION: CHF with mild pulmonary interstitial edema. Bibasilar infiltrates compatible with pneumonia. Electronically Signed   By: David  Martinique M.D.   On: 05/15/2015 07:53   Dg Chest Port 1 View  05/13/2015  CLINICAL DATA:  Cough. EXAM: PORTABLE CHEST 1 VIEW COMPARISON:  April 15, 2015. FINDINGS: Stable cardiomediastinal silhouette. Tracheostomy tube, nasogastric tube and right internal jugular catheter have been removed. No pneumothorax is noted. Hypoinflation of the lungs is noted. Increased bibasilar opacities are noted  concerning for atelectasis, infiltrate or edema. No significant pleural effusion is noted. Bony thorax is unremarkable. IMPRESSION: Support apparatus have been removed. Hypoinflation of the lungs is noted. Increased bibasilar opacities are noted concerning for atelectasis, infiltrates or edema. Electronically Signed   By: Marijo Conception, M.D.   On: 05/13/2015 10:31   Dg Abd Portable 1v  05/20/2015  CLINICAL DATA:  Orogastric tube placement. Acute respiratory failure with hypoxia. EXAM: PORTABLE ABDOMEN - 1 VIEW COMPARISON:  04/09/2015 FINDINGS: Orogastric tube is seen with tip overlying the distal gastric antrum or pylorus. Mild gaseous distention of transverse colon several small bowel loops seen, likely due to mild ileus. Heterogeneous bibasilar pulmonary airspace disease also noted. IMPRESSION: Orogastric tube tip overlies the distal gastric antrum or pylorus. Probable mild ileus. Electronically Signed   By: Earle Gell M.D.   On: 05/20/2015 23:08   Dg Swallowing Func-speech Pathology  06/03/2015  Objective Swallowing Evaluation: Type of Study: MBS-Modified Barium Swallow Study Patient Details Name: Rylend Pietrzak MRN: [id number] Date of Birth: [birth year] Today's Date: 06/03/2015 Time: SLP Start Time (ACUTE ONLY): 1343-SLP Stop Time (ACUTE ONLY): 1410 SLP Time Calculation (min) (ACUTE ONLY): 27 min Past Medical History: Past Medical History Diagnosis Date . Autism  . Sickle cell trait ([place])  . Pneumonia 02/2015 . Autism  Past Surgical History: Past Surgical History Procedure Laterality Date . No past surgeries   HPI: Pt is a 40 y.o. male with PMH of autism and ADHD ,  sickle cell trait, independent prior to admission. Pt was admitted for PNA and discharged 03/10/15 readmitted 04/07/15 with respiratory failure requiring intubation, tracheostomy 03/20/2015; failed extubation [facility]. Pt  decannulated 04/25/2015, diet slowly advanced to a regular consistency, bouts of  tachycardia atrial fibrillation converted back to normal sinus rhythm and transferred to CIR.  SLP evaluated 5/4 and was being followed for dysphagia due to deconditioning and cognitive impairment but was on regular solids/ thin liquids. Transferred back to acute venue with desaturation in the 70's. BSE completed 5/7 recommending regular diet/thin liquids; continued with respiratory insufficiency and intubated 5/9-5/20 and reorders to assess swallow function.  Subjective: pt alert, pleasant mood Assessment / Plan / Recommendation CHL IP CLINICAL IMPRESSIONS 06/03/2015 Therapy Diagnosis Mild pharyngeal phase dysphagia Clinical Impression MBS attempted, but limited trials due to poor participation (oral holding) lead to inconclusive result. Pt consumed two sips of nectar thick liquids and one sip of thin with an observable delay in swallow, but no penetration or aspiration. Strength WNL. Pt likely to tolerate thin liquids with precautions, but further subjective observation warranted. Unable to determine if pt senses aspiration, since it did not occur. Pt recommended to consume nectar thick liquids and puree today. Will f/u tomorrow for diet tolerance and potential upgrade.  Impact on safety and function --   CHL IP TREATMENT RECOMMENDATION 06/03/2015 Treatment Recommendations Therapy as outlined in treatment plan below   Prognosis 06/01/2015 Prognosis for Safe Diet Advancement Good Barriers to Reach Goals Cognitive deficits;Time post onset;Severity of deficits Barriers/Prognosis Comment -- CHL IP DIET RECOMMENDATION 06/03/2015 SLP Diet Recommendations Nectar thick liquid;Dysphagia 1 (Puree) solids Liquid Administration via Cup;Straw Medication Administration Crushed with puree Compensations -- Postural Changes --   CHL IP OTHER RECOMMENDATIONS 06/03/2015 Recommended Consults -- Oral Care Recommendations Oral care BID Other Recommendations --   CHL IP FOLLOW UP RECOMMENDATIONS 06/03/2015 Follow up Recommendations Inpatient  Rehab   CHL IP FREQUENCY AND DURATION 06/03/2015 Speech Therapy Frequency (ACUTE ONLY) min 2x/week Treatment Duration 2 weeks      CHL IP ORAL PHASE 06/03/2015 Oral Phase Impaired Oral - Pudding Teaspoon -- Oral - Pudding Cup -- Oral - Honey Teaspoon -- Oral - Honey Cup -- Oral - Nectar Teaspoon -- Oral - Nectar Cup -- Oral - Nectar Straw Holding of bolus Oral - Thin Teaspoon -- Oral - Thin Cup -- Oral - Thin Straw Holding of bolus Oral - Puree -- Oral - Mech Soft -- Oral - Regular -- Oral - Multi-Consistency -- Oral - Pill -- Oral Phase - Comment --  CHL IP PHARYNGEAL PHASE 06/03/2015 Pharyngeal Phase Impaired Pharyngeal- Pudding Teaspoon -- Pharyngeal -- Pharyngeal- Pudding Cup -- Pharyngeal -- Pharyngeal- Honey Teaspoon -- Pharyngeal -- Pharyngeal- Honey Cup -- Pharyngeal -- Pharyngeal- Nectar Teaspoon -- Pharyngeal -- Pharyngeal- Nectar Cup -- Pharyngeal -- Pharyngeal- Nectar Straw Delayed swallow initiation-pyriform sinuses Pharyngeal -- Pharyngeal- Thin Teaspoon -- Pharyngeal -- Pharyngeal- Thin Cup -- Pharyngeal -- Pharyngeal- Thin Straw Delayed swallow initiation-pyriform sinuses Pharyngeal -- Pharyngeal- Puree -- Pharyngeal -- Pharyngeal- Mechanical Soft -- Pharyngeal -- Pharyngeal- Regular -- Pharyngeal -- Pharyngeal- Multi-consistency -- Pharyngeal -- Pharyngeal- Pill -- Pharyngeal -- Pharyngeal Comment --  No flowsheet data found. No flowsheet data found. Herbie [place], [place] CCC-SLP [phone] Lynann Beaver 06/03/2015, 2:49 PM               Lab Data:  CBC:  Recent Labs Lab 06/03/15 0350 06/04/15 0414 06/05/15 0503 06/06/15 0537 06/07/15 0514  WBC 22.2*  18.7* 19.6* 17.2* 12.2*  NEUTROABS 17.8* 15.0* 15.0* 12.5*  --   HGB 9.4* 9.6* 10.4* 9.9* 9.2*  HCT 30.6* 32.2* 35.7* 32.9* 30.5*  MCV 86.0 87.0 89.0 87.0 85.4  PLT 492* 366 315 266 161   Basic Metabolic Panel:  Recent Labs Lab 06/03/15 0350  06/04/15 0414  06/05/15 0503 06/05/15 1825 06/06/15 0537 06/06/15 1614  06/07/15 0514  NA 156*  < > 156*  < > 151* 148* 149* 144 142  K 3.3*  < > 3.3*  < > 2.9* 3.7 3.3* 3.7 3.2*  CL 112*  < > 113*  < > 108 108 108 107 103  CO2 32  < > 36*  < > 36* 34* 34* 33* 30  GLUCOSE 123*  < > 131*  < > 133* 129* 109* 126* 101*  BUN 31*  < > 25*  < > 19 17 18 18 16   CREATININE 1.07  < > 0.96  < > 0.96 0.92 1.12 1.14 0.93  CALCIUM 9.5  < > 9.1  < > 8.7* 8.7* 8.6* 8.3* 8.6*  MG 2.2  --  2.0  --  1.8  --  1.6*  --   --   PHOS 3.9  --  3.0  --  2.9  --  3.0  --   --   < > = values in this interval not displayed. GFR: Estimated Creatinine Clearance: 107.7 mL/min (by C-G formula based on Cr of 0.93). Liver Function Tests:  Recent Labs Lab 06/02/15 1345 06/03/15 0350 06/04/15 0414 06/05/15 0503 06/06/15 0537 06/07/15 0514  AST 27  --   --   --   --  46*  ALT 23  --   --   --   --  41  ALKPHOS 43  --   --   --   --  39  BILITOT 0.6  --   --   --   --  0.5  PROT 6.1*  --   --   --   --  5.4*  ALBUMIN 2.5* 2.8* 2.7* 2.6* 2.6* 2.5*    Recent Labs Lab 06/02/15 1345  LIPASE 98*  AMYLASE 132*   No results for input(s): AMMONIA in the last 168 hours. Coagulation Profile: No results for input(s): INR, PROTIME in the last 168 hours. Cardiac Enzymes: No results for input(s): CKTOTAL, CKMB, CKMBINDEX, TROPONINI in the last 168 hours. BNP (last 3 results) No results for input(s): PROBNP in the last 8760 hours. HbA1C: No results for input(s): HGBA1C in the last 72 hours. CBG:  Recent Labs Lab 06/07/15 0829 06/07/15 1207 06/07/15 1642 06/07/15 2224 06/08/15 0956  GLUCAP 100* 111* 117* 118* 88   Lipid Profile: No results for input(s): CHOL, HDL, LDLCALC, TRIG, CHOLHDL, LDLDIRECT in the last 72 hours. Thyroid Function Tests: No results for input(s): TSH, T4TOTAL, FREET4, T3FREE, THYROIDAB in the last 72 hours. Anemia Panel: No results for input(s): VITAMINB12, FOLATE, FERRITIN, TIBC, IRON, RETICCTPCT in the last 72 hours. Urine analysis:    Component Value  Date/Time   COLORURINE YELLOW 05/13/2015 [place] 05/13/2015 1540   LABSPEC 1.017 05/13/2015 1540   PHURINE 5.5 05/13/2015 1540   GLUCOSEU NEGATIVE 05/13/2015 1540   HGBUR NEGATIVE 05/13/2015 1540   BILIRUBINUR NEGATIVE 05/13/2015 1540   KETONESUR NEGATIVE 05/13/2015 1540   PROTEINUR NEGATIVE 05/13/2015 1540   NITRITE NEGATIVE 05/13/2015 1540   LEUKOCYTESUR NEGATIVE 05/13/2015 [place] M.D. [facility] 06/08/2015, 10:19 AM  Pager: [phone] Between 7am to 7pm - call Pager - [phone]  After 7pm go to [email or web address] - password [email or web address]  Call night coverage person covering after 7pm

## 2015-06-08 NOTE — Progress Notes (Signed)
Pt refused his scheduled heparin sq this morning, he refused his lab to be drawn and vital signs to be checked will continue to monitor

## 2015-06-09 DIAGNOSIS — I5032 Chronic diastolic (congestive) heart failure: Secondary | ICD-10-CM

## 2015-06-09 LAB — CBC
HEMATOCRIT: 31.4 % — AB (ref 39.0–52.0)
Hemoglobin: 10 g/dL — ABNORMAL LOW (ref 13.0–17.0)
MCH: 26.4 pg (ref 26.0–34.0)
MCHC: 31.8 g/dL (ref 30.0–36.0)
MCV: 82.8 fL (ref 78.0–100.0)
Platelets: 254 10*3/uL (ref 150–400)
RBC: 3.79 MIL/uL — ABNORMAL LOW (ref 4.22–5.81)
RDW: 16.9 % — AB (ref 11.5–15.5)
WBC: 18.3 10*3/uL — ABNORMAL HIGH (ref 4.0–10.5)

## 2015-06-09 LAB — BASIC METABOLIC PANEL
Anion gap: 7 (ref 5–15)
BUN: 11 mg/dL (ref 6–20)
CALCIUM: 8 mg/dL — AB (ref 8.9–10.3)
CO2: 26 mmol/L (ref 22–32)
CREATININE: 0.83 mg/dL (ref 0.61–1.24)
Chloride: 108 mmol/L (ref 101–111)
GFR calc non Af Amer: >60 mL/min (ref >60)
GLUCOSE: 176 mg/dL — AB (ref 65–99)
Potassium: 3.3 mmol/L — ABNORMAL LOW (ref 3.5–5.1)
Sodium: 141 mmol/L (ref 135–145)

## 2015-06-09 LAB — GLUCOSE, CAPILLARY
Glucose-Capillary: 83 mg/dL (ref 65–99)
Glucose-Capillary: 102 mg/dL — ABNORMAL HIGH (ref 65–99)
Glucose-Capillary: 125 mg/dL — ABNORMAL HIGH (ref 65–99)
Glucose-Capillary: 112 mg/dL — ABNORMAL HIGH (ref 65–99)

## 2015-06-09 MED ORDER — PREDNISONE 20 MG PO TABS
40.0000 mg | ORAL_TABLET | Freq: Every day | ORAL | Status: DC
  Administered 2015-06-09 – 2015-06-10 (×2): 40 mg via ORAL
  Filled 2015-06-09 (×2): qty 2

## 2015-06-09 NOTE — Progress Notes (Signed)
[facility]                                                                              Patient Demographics  Juan Cameron, is a 40 y.o. male, DOB - Jun 26, 1975, OBS:[id number]  Admit date - 05/16/2015   Admitting Physician Collene Gobble, MD  Outpatient Primary MD for the patient is ASRES,ALEHEGN, MD  Outpatient specialists:   LOS - 24  days    No chief complaint on file.      Brief summary   40 y.o. M with h/o autism and sickle cell trait who previously underwent a prolonged hospitalization for PNA with ARDS s/p tracheostomy then eventual decannulaton w/ transfer to rehab. He developed progressive dyspnea and hypoxia leading to respiratory failure from MRSA pneumonia, and had to be readmitted to the acute hospital on 05/16/15.   Significant Events: 5/05 Tx from rehab to [facility] with respiratory failure 5/09 To ICU > VDRF 5/18 Transfuse PRBC, changed to diprivan 5/19 Added solumedrol 5/20 Transfuse PRBC   Assessment & Plan   Acute Hypoxic Respiratory Failure due to MRSA Pneumonia and pulmonary edema  - DC IV Solu-Medrol, transitioned to oral prednisone today with gradual wean over 2 weeks - has completed a course of abx - leukocytosis had been declining however now patient continues to refuse lab draws  - Speech therapy following, advance diet to dysphagia 3 today   Septic Shock due to PNA resolved  Acute Renal Failure - Resolved  Hypernatremia - Improved  Hypokalemia, hypomagnesemia - Refused labs   Dysphagia -  speech therapy following, advance diet to dysphagia 3 with  thin liquids  - has been intermittently refusing to eat - SLP following  - may need PEG tube if continues to refuse to swallow  Anemia - Likely anemia of chronic disease, hemoglobin currently stable   Hyperglycemia - On steroids and dextrose IVF -  A1c pending   Acute Encephalopathy vs Depression  Psych has evaluated - cont zyprexa per Psych   Physical Deconditioning -  PTOT evaluation, return to CIR, and discussed with inpatient rehabilitation  H/O Autism - Mental impairment Reportedly has a very limited vocabulary (~500 words)  Code Status: full   DVT Prophylaxis:   heparin    Family Communication: No family member at the bedside.  Disposition Plan: Medically stable, return back to CIR, per inpatient rehabilitation hopefully will be able to admit in next 24 hours  Time Spent in minutes  15 minutes  Procedures:  None  Consults :   PC CM Psychiatry    Antimicrobials  5/02 Levaquin >> 5/03 5/04 Zosyn >> 5/07 5/04 Vancomycin >> 5/17 5/07 Rocephin >> 5/09 5/09 Tressie Ellis >> 5/16  Medications  Scheduled Meds: . antiseptic oral rinse  7 mL Mouth Rinse QID  . chlorhexidine gluconate (SAGE KIT)  15 mL Mouth Rinse BID  . clonazePAM  0.5 mg Oral QHS  . ferrous sulfate  325 mg Oral BID WC  . heparin subcutaneous  5,000 Units Subcutaneous Q8H  . insulin aspart  0-20 Units Subcutaneous TID WC  . insulin aspart  0-5 Units Subcutaneous QHS  . OLANZapine zydis  5 mg Oral BID  . pantoprazole  40 mg Oral Daily  . predniSONE  40 mg Oral Q breakfast  . vitamin C  250 mg Oral BID WC   Continuous Infusions: . dextrose 5 % with KCl 20 mEq / L 20 mEq (06/08/15 0426)   PRN Meds:.acetaminophen (TYLENOL) oral liquid 160 mg/5 mL, acetaminophen, ipratropium-albuterol, labetalol, ondansetron (ZOFRAN) IV, phenol, polyethylene glycol, RESOURCE THICKENUP CLEAR, sodium chloride flush, white petrolatum   Antibiotics   Anti-infectives    Start     Dose/Rate Route Frequency Ordered Stop   05/23/15 1800  vancomycin (VANCOCIN) IVPB 1000 mg/200 mL premix     1,000 mg 200 mL/hr over 60 Minutes Intravenous Every 24 hours 05/23/15 1450 05/28/15 1835   05/20/15 2100  cefTAZidime (FORTAZ) 1 g in dextrose 5 % 50 mL IVPB  Status:  Discontinued     1 g 100 mL/hr over 30 Minutes Intravenous Every 8 hours 05/20/15 2028 05/27/15 0918   05/20/15 1400  vancomycin (VANCOCIN)  1,500 mg in sodium chloride 0.9 % 500 mL IVPB  Status:  Discontinued     1,500 mg 250 mL/hr over 120 Minutes Intravenous Every 24 hours 05/20/15 1354 05/23/15 1450   05/19/15 1800  vancomycin (VANCOCIN) IVPB 1000 mg/200 mL premix  Status:  Discontinued     1,000 mg 200 mL/hr over 60 Minutes Intravenous Every 12 hours 05/19/15 0935 05/19/15 1103   05/19/15 1200  cefTRIAXone (ROCEPHIN) 1 g in dextrose 5 % 50 mL IVPB  Status:  Discontinued     1 g 100 mL/hr over 30 Minutes Intravenous Every 24 hours 05/19/15 1103 05/20/15 1351   05/16/15 1400  piperacillin-tazobactam (ZOSYN) IVPB 3.375 g  Status:  Discontinued     3.375 g 12.5 mL/hr over 240 Minutes Intravenous Every 8 hours 05/16/15 1327 05/19/15 1103   05/16/15 1400  vancomycin (VANCOCIN) IVPB 1000 mg/200 mL premix  Status:  Discontinued     1,000 mg 200 mL/hr over 60 Minutes Intravenous Every 8 hours 05/16/15 1327 05/18/15 0506        Subjective:   Juan Cameron was seen and examined today.  Patient talking today however refused to be examined. Reports he is comfortable and has no pain. Denies any chest pain or nausea or vomiting or abdominal pain. No fevers or chills. No acute issues overnight. However he continued to pull the blanket up.  Filed Vitals:   06/08/15 1432 06/08/15 2221 06/09/15 0503 06/09/15 0507  BP: 122/78 123/83  138/93  Pulse: 104 93  102  Temp: 98.4 F (36.9 C) 98.6 F (37 C)  98.1 F (36.7 C)  TempSrc: Oral     Resp: 20 18  18   Height:      Weight:   38.51 kg (84 lb 14.4 oz)   SpO2: 96% 99%  100%    Intake/Output Summary (Last 24 hours) at 06/09/15 1022 Last data filed at 06/09/15 0504  Gross per 24 hour  Intake    240 ml  Output   2425 ml  Net  -2185 ml     Wt Readings from Last 3 Encounters:  06/09/15 38.51 kg (84 lb 14.4 oz)  05/16/15 91.536 kg (201 lb 12.8 oz)  04/07/15 104.8 kg (231 lb 0.7 oz)     Exam  General: Much moreAlert and awake, Talkative and Oriented today, continue to pull  the blanket to his face:    Neck:   Cardiovascular: Unable to examine   Respiratory: Unable to examine  Gastrointestinal:  Unable to examine   Ext: unable to examine   Neuro: Moving all 4 extremities  skin: No rashes  Psych: alert and awake, refuses to be examined,   Data Reviewed:  I have personally reviewed following labs and imaging studies  Micro Results Recent Results (from the past 240 hour(s))  Rapid strep screen (not at [facility])     Status: None   Collection Time: 06/05/15 10:10 PM  Result Value Ref Range Status   Streptococcus, Group A Screen (Direct) NEGATIVE NEGATIVE Final    Comment: (NOTE) A Rapid Antigen test may result negative if the antigen level in the sample is below the detection level of this test. The FDA has not cleared this test as a stand-alone test therefore the rapid antigen negative result has reflexed to a Group A Strep culture.   Culture, group A strep     Status: None   Collection Time: 06/05/15 10:10 PM  Result Value Ref Range Status   Specimen Description THROAT  Final   Special Requests NONE Reflexed from [id number]  Final   Culture FEW STREPTOCOCCUS,BETA HEMOLYTIC NOT GROUP A  Final   Report Status 06/06/2015 FINAL  Final  C difficile quick scan w PCR reflex     Status: None   Collection Time: 06/06/15  1:20 PM  Result Value Ref Range Status   C Diff antigen NEGATIVE NEGATIVE Final   C Diff toxin NEGATIVE NEGATIVE Final   C Diff interpretation Negative for toxigenic C. difficile  Final    Radiology Reports Dg Chest 2 View  05/14/2015  CLINICAL DATA:  Shortness of breath.  Cough. EXAM: CHEST  2 VIEW COMPARISON:  05/13/2015. FINDINGS: Mediastinum and hilar structures normal. Progressive diffuse bilateral pulmonary infiltrates. Heart size normal. No pleural effusion or pneumothorax. IMPRESSION: 1. Diffuse bilateral pulmonary infiltrates, progressive from prior exam. 2.  Heart size stable . Electronically Signed   By: Marcello Moores  Register   On:  05/14/2015 08:15   US Abdomen Complete  06/02/2015  CLINICAL DATA:  Abdominal pain EXAM: ABDOMEN ULTRASOUND COMPLETE COMPARISON:  None FINDINGS: Gallbladder: Normally distended without stones or wall thickening. No pericholecystic fluid or sonographic Murphy sign. Common bile duct: Diameter: Normal caliber 5 mm diameter Liver: New normal appearance IVC: Normal appearance Pancreas: Obscured by bowel gas Spleen: Normal appearance, 7.0 cm length Right Kidney: Length: 10.8 cm. Normal morphology without mass or hydronephrosis. Left Kidney: Length: 11.9 cm. Normal morphology without mass or hydronephrosis. Abdominal aorta: Normal caliber Other findings: No free fluid IMPRESSION: Nonvisualization of pancreas, obscured by bowel gas. Remainder of exam normal. Electronically Signed   By: Lavonia Dana M.D.   On: 06/02/2015 15:29   Dg Chest Port 1 View  06/07/2015  CLINICAL DATA:  Pneumonia EXAM: PORTABLE CHEST 1 VIEW COMPARISON:  06/01/2015 FINDINGS: Right PICC line tip:  Upper cavoatrial junction. Very low lung volumes. Mild enlargement of the cardiopericardial silhouette. In addition to the vascular crowding, there is additional indistinctness of the pulmonary vasculature, mild interstitial accentuation, and indistinct bilobed basilar airspace opacities. The retrocardiac airspace opacities stable. The right basilar airspace opacity appears slightly improved compared to 06/01/2015. IMPRESSION: 1. Continued bibasilar airspace opacities, potentially from atelectasis or pneumonia. However, the right basilar airspace opacity appears mildly improved compared to 6 days ago. 2. Very low lung volumes causing vascular crowding. There is also some pulmonary vascular indistinctness which could be from mild edema or atypical pneumonia. 3. Stable enlargement of the cardiopericardial silhouette. 4. Right PICC line tip:  Upper cavoatrial junction.  Electronically Signed   By: Van Clines M.D.   On: 06/07/2015 08:29   Dg Chest  Port 1 View  06/01/2015  CLINICAL DATA:  Extubation.  Followup pneumonia/ ARDS. EXAM: PORTABLE CHEST 1 VIEW COMPARISON:  05/31/2015 and earlier. FINDINGS: Right arm PICC tip projects over the mid SVC, unchanged. Interval extubation and nasogastric tube removal. Cardiac silhouette upper normal in size to slightly enlarged for technique and degree of inspiration, unchanged. Patchy airspace opacities throughout both lungs, unchanged. Possible bilateral effusions, unchanged. No new pulmonary parenchymal abnormalities. IMPRESSION: 1. Support apparatus satisfactory. 2. Stable patchy pneumonia and/or ARDS throughout both lungs. 3. Stable small bilateral effusions. 4. No new abnormalities. Electronically Signed   By: Evangeline Dakin M.D.   On: 06/01/2015 10:16   Dg Chest Port 1 View  05/31/2015  CLINICAL DATA:  Respiratory failure EXAM: PORTABLE CHEST 1 VIEW COMPARISON:  Chest radiograph from one day prior. FINDINGS: Endotracheal tube tip is 2.0 cm above the carina. Enteric tube enters stomach with the tip not seen on this image. Right PICC terminates in the lower third of the superior vena cava. Stable cardiomediastinal silhouette with normal heart size. No pneumothorax. No pleural effusion. Low lung volumes. Patchy opacities throughout both lungs are not appreciably changed. IMPRESSION: 1. Support structures as described . 2. Low lung volumes with stable patchy opacities throughout both lungs, probably representing either multifocal pneumonia and/or ARDS. Electronically Signed   By: Ilona Sorrel M.D.   On: 05/31/2015 10:39   Dg Chest Port 1 View  05/30/2015  CLINICAL DATA:  Respiratory failure, healthcare associated pneumonia, CHF, acute renal failure. EXAM: PORTABLE CHEST 1 VIEW COMPARISON:  Portable chest x-ray of May 29, 2015 FINDINGS: The lungs remain hypoinflated. The interstitial markings remain increased with areas of coarse interstitial density in the lower lung zones. The cardiac silhouette remains  enlarged. The pulmonary vascularity remains prominent centrally. The endotracheal tube tip lies 2 point 5 cm above the carina. The esophagogastric tube tip projects below the inferior margin of the image. The right-sided PICC line tip projects over the midportion of the SVC. IMPRESSION: There has not been significant interval change in the appearance of the chest since yesterday's study. Persistent airspace opacities in both lungs are consistent with edema and/or pneumonia. Electronically Signed   By: David  Martinique M.D.   On: 05/30/2015 07:28   Dg Chest Port 1 View  05/29/2015  CLINICAL DATA:  Respiratory failure.  Shortness of breath. EXAM: PORTABLE CHEST 1 VIEW COMPARISON:  05/28/2015. FINDINGS: Endotracheal tube, NG tube, right PICC line in stable position. Heart size stable. Diffuse bilateral airspace disease is again noted and unchanged. No pleural effusion or pneumothorax. IMPRESSION: 1. Lines and tubes in stable position. 2.  Diffuse bilateral airspace disease.  No interim change. Electronically Signed   By: Marcello Moores  Register   On: 05/29/2015 07:07   Dg Chest Port 1 View  05/28/2015  CLINICAL DATA:  Respiratory failure, healthcare associated pneumonia, chronic CHF, acute renal failure. EXAM: PORTABLE CHEST 1 VIEW COMPARISON:  Portable chest x-ray dated May 27, 2015. FINDINGS: The lungs remain mildly hypoinflated. Confluent interstitial and alveolar opacities are present and slightly more conspicuous today. No significant pleural effusion is observed and there is no pneumothorax. The cardiac silhouette is enlarged. The pulmonary vascularity is engorged. The endotracheal tube tip lies 3 cm above the carina. The esophagogastric tube tip projects below the inferior margin of the image. The right-sided PICC line tip projects over the midportion of the SVC. IMPRESSION: Slight  interval deterioration in the appearance of the pulmonary interstitium consistent with pulmonary edema or bilateral pneumonia. The  support tubes are in stable position. Electronically Signed   By: David  Martinique M.D.   On: 05/28/2015 07:48   Dg Chest Port 1 View  05/27/2015  CLINICAL DATA:  Respiratory failure. EXAM: PORTABLE CHEST 1 VIEW COMPARISON:  05/26/2015. FINDINGS: Endotracheal tube, NG tube, right PICC line stable position. Heart size stable. Diffuse bilateral pulmonary infiltrates and or edema again noted and unchanged. No pleural effusion or pneumothorax. IMPRESSION: 1. Lines and tubes in stable position. 2.  Stable cardiomegaly. 3. Diffuse bilateral pulmonary infiltrates and or edema again noted. No interim change. Electronically Signed   By: Marcello Moores  Register   On: 05/27/2015 07:20   Dg Chest Port 1 View  05/26/2015  CLINICAL DATA:  Respiratory failure. EXAM: PORTABLE CHEST 1 VIEW COMPARISON:  05/25/2015. FINDINGS: Endotracheal tube, NG tube, right PICC line in stable position. Stable cardiomegaly. Diffuse bilateral pulmonary infiltrates and/or edema again noted, no interim change. No prominent pleural effusion or pneumothorax . Bowel distention noted. Process such as ileus cannot be excluded. Abdominal series can be obtained for further evaluation . IMPRESSION: 1. Lines and tubes in stable position. 2.  Stable cardiomegaly. 3. Diffuse unchanged bilateral pulmonary infiltrates and or edema. 3. Bowel distention noted. A process such as an ileus cannot be excluded. Abdominal series can be obtained for further evaluation. Electronically Signed   By: Marcello Moores  Register   On: 05/26/2015 07:14   Dg Chest Port 1 View  05/25/2015  CLINICAL DATA:  Acute respiratory failure EXAM: PORTABLE CHEST 1 VIEW COMPARISON:  05/24/2015 FINDINGS: Cardiomegaly again noted. Stable endotracheal and NG tube position. Stable right arm PICC line position. Again noted patchy multifocal bilateral airspace disease and mild interstitial prominence with slight worsening in aeration in right lung. Findings suspicious for worsening pneumonia or pulmonary edema.  IMPRESSION: Stable support apparatus.Again noted patchy multifocal bilateral airspace disease and mild interstitial prominence with slight worsening in aeration in right lung. Findings suspicious for worsening pneumonia or pulmonary edema. Electronically Signed   By: Lahoma Crocker M.D.   On: 05/25/2015 09:01   Dg Chest Port 1 View  05/24/2015  CLINICAL DATA:  40 year old male with history of acute respiratory failure. EXAM: PORTABLE CHEST 1 VIEW COMPARISON:  Chest x-ray 05/23/2015. FINDINGS: An endotracheal tube is in place with tip 2.2 cm above the carina. There is a right upper extremity PICC with tip terminating in the mid superior vena cava. Lung volumes are low. Patchy interstitial and airspace disease throughout the lungs bilaterally. Crowding of the pulmonary vasculature. Heart size is borderline enlarged, likely accentuated by the low lung volumes. Trace bilateral pleural effusions. No pneumothorax. Upper mediastinal contours are within normal limits. IMPRESSION: 1. Support apparatus, as above. 2. Low lung volumes with patchy multifocal interstitial and airspace disease. Findings are relatively symmetric, which could indicate some underlying pulmonary edema, however, findings may alternatively simply reflect multilobar bronchopneumonia. 3. Trace bilateral pleural effusions. Electronically Signed   By: Vinnie Langton M.D.   On: 05/24/2015 11:01   Dg Chest Port 1 View  05/23/2015  CLINICAL DATA:  Hypoxia EXAM: PORTABLE CHEST 1 VIEW COMPARISON:  May 22, 2015 FINDINGS: Endotracheal tube tip is 3.6 cm above carina. Central catheter tip is in the superior vena cava. Nasogastric tube tip and side port are below the diaphragm. No pneumothorax. There is patchy airspace consolidation in both lower lobes. There is a small right effusion. Heart is mildly enlarged  with pulmonary venous hypertension. No adenopathy evident. IMPRESSION: Tube and catheter positions as described without pneumothorax. Evidence of  pulmonary venous hypertension. Airspace opacity in the bases is likely due to bout lateral pneumonia, although there may be a degree of edema in these areas. Both entities may exist concurrently. Electronically Signed   By: Lowella Grip III M.D.   On: 05/23/2015 07:10   Dg Chest Port 1 View  05/22/2015  CLINICAL DATA:  Acute respiratory failure EXAM: PORTABLE CHEST 1 VIEW COMPARISON:  05/21/2015 FINDINGS: Cardiac shadow is stable. A right-sided PICC line is in the distal superior vena cava. The overall inspiratory effort is poor. Diffuse bilateral basilar infiltrate/atelectasis are seen. This is increased somewhat in the interval from the prior exam likely related to the poor inspiratory effort. No other focal abnormality is seen. IMPRESSION: Increasing bibasilar changes likely related to the poor inspiratory effort. Electronically Signed   By: Inez Catalina M.D.   On: 05/22/2015 07:21   Dg Chest Port 1 View  05/21/2015  CLINICAL DATA:  40 year old male with respiratory distress. Sickle cell trait. Initial encounter. EXAM: PORTABLE CHEST 1 VIEW COMPARISON:  05/20/2015 and earlier. FINDINGS: Portable AP semi upright view at 0458 hours. Stable endotracheal tube tip in good position between the level the clavicles and carina. Enteric tube has been placed and loops at the level of the stomach in the left upper quadrant. Stable right PICC line. Continued low lung volumes with widespread coarse and confluent bilateral pulmonary opacity -basilar predominant. Stable cardiac size and mediastinal contours. No pneumothorax. No large pleural effusion. Ventilation has not significantly changed since 05/16/2015. IMPRESSION: 1. Enteric tube placed, loops at the level of the stomach with tip not included. 2.  Otherwise, stable lines and tubes. 3. Stable ventilation since 05/16/2015 with widespread coarse and confluent pulmonary opacity. Electronically Signed   By: Genevie Ann M.D.   On: 05/21/2015 07:15   Dg Chest Port 1  View  05/20/2015  CLINICAL DATA:  Status post intubation. EXAM: PORTABLE CHEST 1 VIEW COMPARISON:  05/19/2015 FINDINGS: New endotracheal tube tip projects 2 cm above the carina. Right PICC is stable in well positioned. Bilateral irregular interstitial and patchy airspace opacities are without significant change from the prior exam. No new lung abnormalities. No obvious pleural effusion and no pneumothorax. IMPRESSION: 1. Endotracheal tube is well positioned. 2. No other change from the prior study. Electronically Signed   By: Lajean Manes M.D.   On: 05/20/2015 15:33   Dg Chest Port 1 View  05/19/2015  CLINICAL DATA:  Respiratory distress.  Decreased oxygen saturations. EXAM: PORTABLE CHEST 1 VIEW COMPARISON:  05/19/2015 FINDINGS: There is a right arm PICC line with tip in the cavoatrial junction. Normal heart size. Bilateral airspace and interstitial opacities are not significantly improved from previous exam. IMPRESSION: 1. No change in aeration to the lungs compared with previous exam. Electronically Signed   By: Kerby Moors M.D.   On: 05/19/2015 21:03   Dg Chest Port 1 View  05/19/2015  CLINICAL DATA:  Dyspnea EXAM: PORTABLE CHEST 1 VIEW COMPARISON:  05/17/2015 FINDINGS: Satisfactorily positioned right upper extremity PICC line. Multifocal airspace opacities persist bilaterally without significant interval change. No pneumothorax. IMPRESSION: No significant interval change in the bilateral airspace opacities. Electronically Signed   By: Andreas Newport M.D.   On: 05/19/2015 02:44   Portable Chest 1 View  05/17/2015  CLINICAL DATA:  Pneumonia EXAM: PORTABLE CHEST 1 VIEW COMPARISON:  May 16, 2015 FINDINGS: Central catheter tip is  in the superior vena cava near the cavoatrial junction. No pneumothorax. There is persistent patchy airspace opacity in the mid and lower lung zones bilaterally. Heart is mildly enlarged. The pulmonary vascularity is normal. No adenopathy. IMPRESSION: Suspect bilateral lower  lobe pneumonia, although pulmonary edema could present similarly. There may be a combination of pneumonia and pulmonary edema present. Heart is mildly enlarged. Central catheter tip in superior vena cava near cavoatrial junction. Electronically Signed   By: Lowella Grip III M.D.   On: 05/17/2015 08:43   Dg Chest Port 1 View  05/16/2015  CLINICAL DATA:  Shortness of Breath with worsening today, cough EXAM: PORTABLE CHEST 1 VIEW COMPARISON:  05/15/2015 FINDINGS: Cardiomediastinal silhouette is stable. Persistent mild interstitial prominence suspicious for mild interstitial edema. Again noted bilateral basilar atelectasis or infiltrate. There is right arm PICC line with tip in right atrium. For distal SVC position the PICC line should be retracted about 2.1 cm. No pneumothorax. IMPRESSION: Persistent mild interstitial prominence suspicious for mild interstitial edema. Again noted bilateral basilar atelectasis or infiltrate. There is right arm PICC line with tip in right atrium. For distal SVC position the PICC line should be retracted about 2.1 cm. Electronically Signed   By: Lahoma Crocker M.D.   On: 05/16/2015 10:41   Dg Chest Port 1 View  05/15/2015  CLINICAL DATA:  Acute respiratory failure, community-acquired pneumonia, ARDS. EXAM: PORTABLE CHEST 1 VIEW COMPARISON:  PA and lateral chest x-ray of May 14, 2015 FINDINGS: The lungs are slightly less well inflated today. There are persistent coarse lung markings with confluent alveolar opacities at the lung bases. Small amounts of pleural fluid blunt the costophrenic angles. The cardiac silhouette is enlarged. The pulmonary vascularity is engorged and indistinct. The observed bony thorax exhibits no acute abnormality. IMPRESSION: CHF with mild pulmonary interstitial edema. Bibasilar infiltrates compatible with pneumonia. Electronically Signed   By: David  Martinique M.D.   On: 05/15/2015 07:53   Dg Chest Port 1 View  05/13/2015  CLINICAL DATA:  Cough. EXAM: PORTABLE  CHEST 1 VIEW COMPARISON:  April 15, 2015. FINDINGS: Stable cardiomediastinal silhouette. Tracheostomy tube, nasogastric tube and right internal jugular catheter have been removed. No pneumothorax is noted. Hypoinflation of the lungs is noted. Increased bibasilar opacities are noted concerning for atelectasis, infiltrate or edema. No significant pleural effusion is noted. Bony thorax is unremarkable. IMPRESSION: Support apparatus have been removed. Hypoinflation of the lungs is noted. Increased bibasilar opacities are noted concerning for atelectasis, infiltrates or edema. Electronically Signed   By: Marijo Conception, M.D.   On: 05/13/2015 10:31   Dg Abd Portable 1v  05/20/2015  CLINICAL DATA:  Orogastric tube placement. Acute respiratory failure with hypoxia. EXAM: PORTABLE ABDOMEN - 1 VIEW COMPARISON:  04/09/2015 FINDINGS: Orogastric tube is seen with tip overlying the distal gastric antrum or pylorus. Mild gaseous distention of transverse colon several small bowel loops seen, likely due to mild ileus. Heterogeneous bibasilar pulmonary airspace disease also noted. IMPRESSION: Orogastric tube tip overlies the distal gastric antrum or pylorus. Probable mild ileus. Electronically Signed   By: Earle Gell M.D.   On: 05/20/2015 23:08   Dg Swallowing Func-speech Pathology  06/03/2015  Objective Swallowing Evaluation: Type of Study: MBS-Modified Barium Swallow Study Patient Details Name: Juan Cameron MRN: [id number] Date of Birth: Nov 06, 1975 Today's Date: 06/03/2015 Time: SLP Start Time (ACUTE ONLY): 1343-SLP Stop Time (ACUTE ONLY): 1410 SLP Time Calculation (min) (ACUTE ONLY): 27 min Past Medical History: Past Medical History Diagnosis Date . Autism  .  Sickle cell trait ([place])  . Pneumonia 02/2015 . Autism  Past Surgical History: Past Surgical History Procedure Laterality Date . No past surgeries   HPI: Pt is a 40 y.o. male with PMH of autism and ADHD , sickle cell trait, independent prior to admission. Pt was admitted for  PNA and discharged 03/10/15 readmitted 04/07/15 with respiratory failure requiring intubation, tracheostomy 03/20/2015; failed extubation [facility]. Pt  decannulated 04/25/2015, diet slowly advanced to a regular consistency, bouts of tachycardia atrial fibrillation converted back to normal sinus rhythm and transferred to CIR.  SLP evaluated 5/4 and was being followed for dysphagia due to deconditioning and cognitive impairment but was on regular solids/ thin liquids. Transferred back to acute venue with desaturation in the 70's. BSE completed 5/7 recommending regular diet/thin liquids; continued with respiratory insufficiency and intubated 5/9-5/20 and reorders to assess swallow function.  Subjective: pt alert, pleasant mood Assessment / Plan / Recommendation CHL IP CLINICAL IMPRESSIONS 06/03/2015 Therapy Diagnosis Mild pharyngeal phase dysphagia Clinical Impression MBS attempted, but limited trials due to poor participation (oral holding) lead to inconclusive result. Pt consumed two sips of nectar thick liquids and one sip of thin with an observable delay in swallow, but no penetration or aspiration. Strength WNL. Pt likely to tolerate thin liquids with precautions, but further subjective observation warranted. Unable to determine if pt senses aspiration, since it did not occur. Pt recommended to consume nectar thick liquids and puree today. Will f/u tomorrow for diet tolerance and potential upgrade.  Impact on safety and function --   CHL IP TREATMENT RECOMMENDATION 06/03/2015 Treatment Recommendations Therapy as outlined in treatment plan below   Prognosis 06/01/2015 Prognosis for Safe Diet Advancement Good Barriers to Reach Goals Cognitive deficits;Time post onset;Severity of deficits Barriers/Prognosis Comment -- CHL IP DIET RECOMMENDATION 06/03/2015 SLP Diet Recommendations Nectar thick liquid;Dysphagia 1 (Puree) solids Liquid Administration via Cup;Straw Medication  Administration Crushed with puree Compensations -- Postural Changes --   CHL IP OTHER RECOMMENDATIONS 06/03/2015 Recommended Consults -- Oral Care Recommendations Oral care BID Other Recommendations --   CHL IP FOLLOW UP RECOMMENDATIONS 06/03/2015 Follow up Recommendations Inpatient Rehab   CHL IP FREQUENCY AND DURATION 06/03/2015 Speech Therapy Frequency (ACUTE ONLY) min 2x/week Treatment Duration 2 weeks      CHL IP ORAL PHASE 06/03/2015 Oral Phase Impaired Oral - Pudding Teaspoon -- Oral - Pudding Cup -- Oral - Honey Teaspoon -- Oral - Honey Cup -- Oral - Nectar Teaspoon -- Oral - Nectar Cup -- Oral - Nectar Straw Holding of bolus Oral - Thin Teaspoon -- Oral - Thin Cup -- Oral - Thin Straw Holding of bolus Oral - Puree -- Oral - Mech Soft -- Oral - Regular -- Oral - Multi-Consistency -- Oral - Pill -- Oral Phase - Comment --  CHL IP PHARYNGEAL PHASE 06/03/2015 Pharyngeal Phase Impaired Pharyngeal- Pudding Teaspoon -- Pharyngeal -- Pharyngeal- Pudding Cup -- Pharyngeal -- Pharyngeal- Honey Teaspoon -- Pharyngeal -- Pharyngeal- Honey Cup -- Pharyngeal -- Pharyngeal- Nectar Teaspoon -- Pharyngeal -- Pharyngeal- Nectar Cup -- Pharyngeal -- Pharyngeal- Nectar Straw Delayed swallow initiation-pyriform sinuses Pharyngeal -- Pharyngeal- Thin Teaspoon -- Pharyngeal -- Pharyngeal- Thin Cup -- Pharyngeal -- Pharyngeal- Thin Straw Delayed swallow initiation-pyriform sinuses Pharyngeal -- Pharyngeal- Puree -- Pharyngeal -- Pharyngeal- Mechanical Soft -- Pharyngeal -- Pharyngeal- Regular -- Pharyngeal -- Pharyngeal- Multi-consistency -- Pharyngeal -- Pharyngeal- Pill -- Pharyngeal -- Pharyngeal Comment --  No flowsheet data found. No flowsheet data found. Herbie [place], [place] CCC-SLP [phone] [place],  Katherene Ponto 06/03/2015, 2:49 PM               Lab Data:  CBC:  Recent Labs Lab 06/03/15 0350 06/04/15 0414 06/05/15 0503 06/06/15 0537 06/07/15 0514  WBC 22.2* 18.7* 19.6* 17.2* 12.2*  NEUTROABS 17.8* 15.0* 15.0*  12.5*  --   HGB 9.4* 9.6* 10.4* 9.9* 9.2*  HCT 30.6* 32.2* 35.7* 32.9* 30.5*  MCV 86.0 87.0 89.0 87.0 85.4  PLT 492* 366 315 266 097   Basic Metabolic Panel:  Recent Labs Lab 06/03/15 0350  06/04/15 0414  06/05/15 0503 06/05/15 1825 06/06/15 0537 06/06/15 1614 06/07/15 0514  NA 156*  < > 156*  < > 151* 148* 149* 144 142  K 3.3*  < > 3.3*  < > 2.9* 3.7 3.3* 3.7 3.2*  CL 112*  < > 113*  < > 108 108 108 107 103  CO2 32  < > 36*  < > 36* 34* 34* 33* 30  GLUCOSE 123*  < > 131*  < > 133* 129* 109* 126* 101*  BUN 31*  < > 25*  < > 19 17 18 18 16   CREATININE 1.07  < > 0.96  < > 0.96 0.92 1.12 1.14 0.93  CALCIUM 9.5  < > 9.1  < > 8.7* 8.7* 8.6* 8.3* 8.6*  MG 2.2  --  2.0  --  1.8  --  1.6*  --   --   PHOS 3.9  --  3.0  --  2.9  --  3.0  --   --   < > = values in this interval not displayed. GFR: Estimated Creatinine Clearance: 58.6 mL/min (by C-G formula based on Cr of 0.93). Liver Function Tests:  Recent Labs Lab 06/02/15 1345 06/03/15 0350 06/04/15 0414 06/05/15 0503 06/06/15 0537 06/07/15 0514  AST 27  --   --   --   --  46*  ALT 23  --   --   --   --  41  ALKPHOS 43  --   --   --   --  39  BILITOT 0.6  --   --   --   --  0.5  PROT 6.1*  --   --   --   --  5.4*  ALBUMIN 2.5* 2.8* 2.7* 2.6* 2.6* 2.5*    Recent Labs Lab 06/02/15 1345  LIPASE 98*  AMYLASE 132*   No results for input(s): AMMONIA in the last 168 hours. Coagulation Profile: No results for input(s): INR, PROTIME in the last 168 hours. Cardiac Enzymes: No results for input(s): CKTOTAL, CKMB, CKMBINDEX, TROPONINI in the last 168 hours. BNP (last 3 results) No results for input(s): PROBNP in the last 8760 hours. HbA1C: No results for input(s): HGBA1C in the last 72 hours. CBG:  Recent Labs Lab 06/08/15 0956 06/08/15 1132 06/08/15 1631 06/08/15 2219 06/09/15 0808  GLUCAP 88 115* 135* 92 83   Lipid Profile: No results for input(s): CHOL, HDL, LDLCALC, TRIG, CHOLHDL, LDLDIRECT in the last 72  hours. Thyroid Function Tests: No results for input(s): TSH, T4TOTAL, FREET4, T3FREE, THYROIDAB in the last 72 hours. Anemia Panel: No results for input(s): VITAMINB12, FOLATE, FERRITIN, TIBC, IRON, RETICCTPCT in the last 72 hours. Urine analysis:    Component Value Date/Time   COLORURINE YELLOW 05/13/2015 [place] 05/13/2015 1540   LABSPEC 1.017 05/13/2015 1540   PHURINE 5.5 05/13/2015 [place] 05/13/2015 1540   HGBUR NEGATIVE 05/13/2015 1540  BILIRUBINUR NEGATIVE 05/13/2015 1540   KETONESUR NEGATIVE 05/13/2015 1540   PROTEINUR NEGATIVE 05/13/2015 1540   NITRITE NEGATIVE 05/13/2015 1540   LEUKOCYTESUR NEGATIVE 05/13/2015 [place] M.D. [facility] 06/09/2015, 10:22 AM  Pager: [phone] Between 7am to 7pm - call Pager - [phone]  After 7pm go to [email or web address] - password [email or web address]  Call night coverage person covering after 7pm

## 2015-06-09 NOTE — Progress Notes (Signed)
I met with pt at bedside and he recognized me from inpt rehab admission. Hugged me as his baseline behavior with me. Pt's sister, [name], called during my visit. We have arranged to meet today at 2 pm to discuss goals of rehab. Pt last worked with therapy 5/24 and did not demonstrate tolerance for more intense therapies at that time. I need to see increased ability for more therapies before considering admission to CIR. Dr. Tana Coast contacted me and I updated her on pt's case. SW also aware. [phone]

## 2015-06-09 NOTE — Progress Notes (Signed)
Speech Language Pathology Treatment: Dysphagia  Patient Details Name: Juan Cameron MRN: [id number] DOB: [birth year] Today's Date: 06/09/2015 Time: 0811-0829 SLP Time Calculation (min) (ACUTE ONLY): 18 min  Assessment / Plan / Recommendation Clinical Impression  Pt seen with am meal, refusing many foods due to dislike, but comsumed pureed eggs automatically without pocketing and masticated graham cracker with peanut butter again without difficulty. Intake is impulsive, so supervision is still recommended. Upgrade diet to dys 3/thin with full supervision.    HPI HPI: Pt is a 40 y.o. male with PMH of autism and ADHD , sickle cell trait, independent prior to admission. Pt was admitted for PNA and discharged 03/10/15 readmitted 04/07/15 with respiratory failure requiring intubation, tracheostomy 03/20/2015; failed extubation [facility]. Pt  decannulated 04/25/2015, diet slowly advanced to a regular consistency, bouts of tachycardia atrial fibrillation converted back to normal sinus rhythm and transferred to CIR.  SLP evaluated 5/4 and was being followed for dysphagia due to deconditioning and cognitive impairment but was on regular solids/ thin liquids. Transferred back to acute venue with desaturation in the 70's. BSE completed 5/7 recommending regular diet/thin liquids; continued with respiratory insufficiency and intubated 5/9-5/20 and reorders to assess swallow function.       SLP Plan  Continue with current plan of care     Recommendations  Diet recommendations: Dysphagia 3 (mechanical soft);Thin liquid Liquids provided via: Cup;Straw Medication Administration: Whole meds with puree Supervision: Patient able to self feed;Full supervision/cueing for compensatory strategies Compensations: Minimize environmental distractions;Slow rate;Small sips/bites Postural Changes and/or Swallow Maneuvers: Seated upright 90 degrees;Upright 30-60 min after meal           Oral Care Recommendations: Oral care BID Follow up Recommendations: Inpatient Rehab Plan: Continue with current plan of care     GO               Juan Cameron, Juan Cameron  Juan Cameron, Juan Cameron 06/09/2015, 8:33 AM

## 2015-06-09 NOTE — Progress Notes (Signed)
I met with pt and his sister/legal guardian, Juan Cameron at bedside. We discussed the need for pt to demonstrate the ability and willingness to participate in more intense therapies. I have asked P.T. To see pt 5/30 for he has not worked with therapy since 06/04/15. I will follow up tomorrow and discuss with the Rehab team if I can readmit pt to inpt rehab tomorrow pending this therapy assessment. Pt and his sister are in agreement to plan. [phone]

## 2015-06-09 NOTE — Progress Notes (Signed)
Physical Therapy Treatment Patient Details Name: Juan Cameron MRN: [id number] DOB: 02-06-1975 Today's Date: 06/09/2015    History of Present Illness pt presents from CIR with Respiratory Failure.  pt intubated 5/9 - 5/20 this admit.  pt with multiple recent admits for respiratory difficulties with most recent admit being D/C to [facility] on 04/07/15 for prolonged weaning from vent and then to CIR for further rehab.  pt with hx of Autism, ADHD, and Sickle Cell.      PT Comments    Patient is making progress toward mobility goals. Ambulated in room with HHA +2. Sister and cousin present for session and helped to encourage pt. Current plan remains appropriate.   Follow Up Recommendations  CIR     Equipment Recommendations  None recommended by PT    Recommendations for Other Services       Precautions / Restrictions Precautions Precautions: Fall Precaution Comments: Watch HR and O2 sats. Restrictions Weight Bearing Restrictions: No    Mobility  Bed Mobility Overal bed mobility: Needs Assistance;+2 for physical assistance Bed Mobility: Supine to Sit     Supine to sit: Min assist;HOB elevated     General bed mobility comments: assist to elevate trunk into sitting; increased time and cues for technique  Transfers Overall transfer level: Needs assistance Equipment used: 2 person hand held assist Transfers: Sit to/from Stand Sit to Stand: +2 physical assistance;Min assist         General transfer comment: X 2 from EOB with assist upon standing for balance; pt used momentum to power up into standing; cues for hand placement and technique  Ambulation/Gait Ambulation/Gait assistance: Min assist;+2 physical assistance Ambulation Distance (Feet): 20 Feet (5, 15) Assistive device: 2 person hand held assist Gait Pattern/deviations: Step-to pattern;Decreased step length - right;Decreased step length - left     General Gait Details: very short steps with HHA +2 for balance with no  LOB; max encouragement to ambulate and family present helped for encouragment; HR elevated to 130 and SpO2 decreased to 86% with ambulation but returned to WNL in ~1 minute with seated rest; educated pt on pursed lip breathing technique    Stairs            Wheelchair Mobility    Modified Rankin (Stroke Patients Only)       Balance Overall balance assessment: Needs assistance Sitting-balance support: No upper extremity supported;Feet supported Sitting balance-Leahy Scale: Fair     Standing balance support: Single extremity supported Standing balance-Leahy Scale: Poor                      Cognition Arousal/Alertness: Awake/alert Behavior During Therapy: WFL for tasks assessed/performed Overall Cognitive Status: History of cognitive impairments - at baseline                      Exercises      General Comments General comments (skin integrity, edema, etc.): pt on 3L O2 via nasal canula throughout session; cousin and sister present for session      Pertinent Vitals/Pain Pain Assessment: No/denies pain Pain Intervention(s): Monitored during session    Home Living                      Prior Function            PT Goals (current goals can now be found in the care plan section) Acute Rehab PT Goals Patient Stated Goal: none stated Progress towards PT goals:  Progressing toward goals    Frequency  Min 3X/week    PT Plan Current plan remains appropriate    Co-evaluation             End of Session Equipment Utilized During Treatment: Gait belt;Oxygen Activity Tolerance: Patient tolerated treatment well Patient left: in chair;with call bell/phone within reach;with chair alarm set;with nursing/sitter in room;with family/visitor present     Time: [id number] PT Time Calculation (min) (ACUTE ONLY): 31 min  Charges:  $Gait Training: 8-22 mins $Therapeutic Activity: 8-22 mins                    G Codes:      Derek MoundKellyn R Alaycia Eardley Shawntia Mangal, PTA Pager: [phone]   06/09/2015, 5:24 PM

## 2015-06-10 ENCOUNTER — Inpatient Hospital Stay (HOSPITAL_COMMUNITY)
Admission: RE | Admit: 2015-06-10 | Discharge: 2015-06-24 | DRG: 945 | Disposition: A | Discharge status: Home Health | Payer: Medicare Other | Source: Intra-hospital | Attending: Physical Medicine & Rehabilitation | Admitting: Physical Medicine & Rehabilitation

## 2015-06-10 DIAGNOSIS — I4891 Unspecified atrial fibrillation: Secondary | ICD-10-CM | POA: Diagnosis present

## 2015-06-10 DIAGNOSIS — R5381 Other malaise: Secondary | ICD-10-CM | POA: Diagnosis present

## 2015-06-10 DIAGNOSIS — D573 Sickle-cell trait: Secondary | ICD-10-CM | POA: Diagnosis present

## 2015-06-10 DIAGNOSIS — D72829 Elevated white blood cell count, unspecified: Secondary | ICD-10-CM | POA: Diagnosis present

## 2015-06-10 DIAGNOSIS — E876 Hypokalemia: Secondary | ICD-10-CM | POA: Diagnosis not present

## 2015-06-10 DIAGNOSIS — D638 Anemia in other chronic diseases classified elsewhere: Secondary | ICD-10-CM | POA: Insufficient documentation

## 2015-06-10 DIAGNOSIS — F329 Major depressive disorder, single episode, unspecified: Secondary | ICD-10-CM | POA: Diagnosis present

## 2015-06-10 DIAGNOSIS — T380X5A Adverse effect of glucocorticoids and synthetic analogues, initial encounter: Secondary | ICD-10-CM | POA: Diagnosis present

## 2015-06-10 DIAGNOSIS — J9610 Chronic respiratory failure, unspecified whether with hypoxia or hypercapnia: Secondary | ICD-10-CM | POA: Diagnosis present

## 2015-06-10 DIAGNOSIS — D649 Anemia, unspecified: Secondary | ICD-10-CM | POA: Diagnosis present

## 2015-06-10 DIAGNOSIS — J9690 Respiratory failure, unspecified, unspecified whether with hypoxia or hypercapnia: Secondary | ICD-10-CM | POA: Diagnosis present

## 2015-06-10 DIAGNOSIS — F840 Autistic disorder: Secondary | ICD-10-CM | POA: Diagnosis present

## 2015-06-10 DIAGNOSIS — K5900 Constipation, unspecified: Secondary | ICD-10-CM | POA: Diagnosis present

## 2015-06-10 DIAGNOSIS — F909 Attention-deficit hyperactivity disorder, unspecified type: Secondary | ICD-10-CM | POA: Diagnosis present

## 2015-06-10 DIAGNOSIS — R05 Cough: Secondary | ICD-10-CM

## 2015-06-10 DIAGNOSIS — R1310 Dysphagia, unspecified: Secondary | ICD-10-CM | POA: Diagnosis present

## 2015-06-10 DIAGNOSIS — D62 Acute posthemorrhagic anemia: Secondary | ICD-10-CM | POA: Diagnosis not present

## 2015-06-10 DIAGNOSIS — R059 Cough, unspecified: Secondary | ICD-10-CM

## 2015-06-10 DIAGNOSIS — J9620 Acute and chronic respiratory failure, unspecified whether with hypoxia or hypercapnia: Secondary | ICD-10-CM

## 2015-06-10 LAB — GLUCOSE, CAPILLARY
GLUCOSE-CAPILLARY: 79 mg/dL (ref 65–99)
GLUCOSE-CAPILLARY: 124 mg/dL — AB (ref 65–99)
Glucose-Capillary: 129 mg/dL — ABNORMAL HIGH (ref 65–99)

## 2015-06-10 LAB — BASIC METABOLIC PANEL
Anion gap: 9 (ref 5–15)
BUN: 13 mg/dL (ref 6–20)
CHLORIDE: 102 mmol/L (ref 101–111)
CO2: 28 mmol/L (ref 22–32)
CREATININE: 0.78 mg/dL (ref 0.61–1.24)
Calcium: 8.8 mg/dL — ABNORMAL LOW (ref 8.9–10.3)
GFR calc non Af Amer: >60 mL/min (ref >60)
Glucose, Bld: 103 mg/dL — ABNORMAL HIGH (ref 65–99)
POTASSIUM: 3.3 mmol/L — AB (ref 3.5–5.1)
SODIUM: 139 mmol/L (ref 135–145)

## 2015-06-10 LAB — HEMOGLOBIN A1C
HEMOGLOBIN A1C: 6.3 % — AB (ref 4.8–5.6)
MEAN PLASMA GLUCOSE: 134 mg/dL

## 2015-06-10 MED ORDER — FERROUS SULFATE 325 (65 FE) MG PO TABS
325.0000 mg | ORAL_TABLET | Freq: Two times a day (BID) | ORAL | Status: DC
  Administered 2015-06-11 – 2015-06-24 (×28): 325 mg via ORAL
  Filled 2015-06-10 (×28): qty 1

## 2015-06-10 MED ORDER — PANTOPRAZOLE SODIUM 40 MG PO TBEC
40.0000 mg | DELAYED_RELEASE_TABLET | Freq: Every day | ORAL | Status: DC
  Administered 2015-06-11 – 2015-06-24 (×14): 40 mg via ORAL
  Filled 2015-06-10 (×14): qty 1

## 2015-06-10 MED ORDER — ACETAMINOPHEN 160 MG/5ML PO SOLN
650.0000 mg | Freq: Four times a day (QID) | ORAL | Status: DC | PRN
  Administered 2015-06-11 – 2015-06-12 (×2): 649.6 mg via ORAL
  Administered 2015-06-16: 650 mg via ORAL
  Filled 2015-06-10 (×2): qty 20.3

## 2015-06-10 MED ORDER — VITAMIN C 500 MG PO TABS
250.0000 mg | ORAL_TABLET | Freq: Two times a day (BID) | ORAL | Status: DC
  Administered 2015-06-11 – 2015-06-24 (×28): 250 mg via ORAL
  Filled 2015-06-10 (×28): qty 1

## 2015-06-10 MED ORDER — IPRATROPIUM-ALBUTEROL 0.5-2.5 (3) MG/3ML IN SOLN: 3.0000 mL | RESPIRATORY_TRACT | Status: DC | PRN

## 2015-06-10 MED ORDER — PREDNISONE 20 MG PO TABS
40.0000 mg | ORAL_TABLET | Freq: Every day | ORAL | Status: DC
  Administered 2015-06-11: 40 mg via ORAL
  Filled 2015-06-10 (×2): qty 2

## 2015-06-10 MED ORDER — OLANZAPINE 5 MG PO TBDP
5.0000 mg | ORAL_TABLET | Freq: Two times a day (BID) | ORAL | Status: DC
  Administered 2015-06-10 – 2015-06-24 (×28): 5 mg via ORAL
  Filled 2015-06-10 (×31): qty 1

## 2015-06-10 MED ORDER — POLYETHYLENE GLYCOL 3350 17 G PO PACK: 17.0000 g | PACK | Freq: Every day | ORAL | Status: DC | PRN

## 2015-06-10 MED ORDER — HEPARIN SODIUM (PORCINE) 5000 UNIT/ML IJ SOLN: 5000.0000 [IU] | Freq: Three times a day (TID) | INTRAMUSCULAR | Status: DC

## 2015-06-10 MED ORDER — CLONAZEPAM 0.5 MG PO TABS
0.5000 mg | ORAL_TABLET | Freq: Every day | ORAL | Status: DC
  Administered 2015-06-10 – 2015-06-23 (×14): 0.5 mg via ORAL
  Filled 2015-06-10 (×14): qty 1

## 2015-06-10 MED ORDER — ONDANSETRON HCL 4 MG/2ML IJ SOLN: 4.0000 mg | Freq: Four times a day (QID) | INTRAMUSCULAR | Status: DC | PRN

## 2015-06-10 MED ORDER — HEPARIN SODIUM (PORCINE) 5000 UNIT/ML IJ SOLN
5000.0000 [IU] | Freq: Three times a day (TID) | INTRAMUSCULAR | Status: DC
  Administered 2015-06-10 – 2015-06-24 (×35): 5000 [IU] via SUBCUTANEOUS
  Filled 2015-06-10 (×35): qty 1

## 2015-06-10 MED ORDER — POTASSIUM CHLORIDE CRYS ER 20 MEQ PO TBCR
40.0000 meq | EXTENDED_RELEASE_TABLET | Freq: Once | ORAL | Status: AC
  Administered 2015-06-10: 40 meq via ORAL
  Filled 2015-06-10: qty 2

## 2015-06-10 MED ORDER — ONDANSETRON HCL 4 MG PO TABS
4.0000 mg | ORAL_TABLET | Freq: Four times a day (QID) | ORAL | Status: DC | PRN
Start: 2015-06-10 — End: 2015-06-24

## 2015-06-10 MED ORDER — SORBITOL 70 % SOLN: 30.0000 mL | Freq: Every day | Status: DC | PRN

## 2015-06-10 MED ORDER — WHITE PETROLATUM GEL
Status: DC | PRN
  Filled 2015-06-10: qty 1

## 2015-06-10 NOTE — Progress Notes (Signed)
Physical Therapy Treatment Patient Details Name: Juan Cameron MRN: [id number] DOB: 1975/10/24 Today's Date: 06/10/2015    History of Present Illness pt presents from CIR with Respiratory Failure.  pt intubated 5/9 - 5/20 this admit.  pt with multiple recent admits for respiratory difficulties with most recent admit being D/C to [facility] on 04/07/15 for prolonged weaning from vent and then to CIR for further rehab.  pt with hx of Autism, ADHD, and Sickle Cell.      PT Comments    Pt OOB in recliner on 2 lts watching TV.  Required increased time to motivate and encourage activity.  Assisted with applying sneakers then amb a greater distance in hallway.  Pt did not want to walk in the hallway but then was encouraged by a nice [employer] [facility] who promised him ICE Cream if he walked up to were she was standing.  Good alternating gait with mild instability and staggering all planes. Delayed self corrective reaction to midline.   HIGH FALL RISK.  Follow Up Recommendations  CIR     Equipment Recommendations       Recommendations for Other Services       Precautions / Restrictions Precautions Precautions: Fall Precaution Comments: Watch HR and O2 sats. Restrictions Weight Bearing Restrictions: No    Mobility  Bed Mobility Overal bed mobility: Needs Assistance Bed Mobility: Supine to Sit     Supine to sit: Supervision     General bed mobility comments: OOB in recliner  Transfers Overall transfer level: Needs assistance Equipment used: 1 person hand held assist Transfers: Sit to/from Stand Sit to Stand: Min guard Stand pivot transfers: Min assist       General transfer comment: repeat VC's to increase self particiaption.    Ambulation/Gait Ambulation/Gait assistance: Min assist Ambulation Distance (Feet): 52 Feet Assistive device: Rolling walker (2 wheeled)   Gait velocity: decreased   General Gait Details: remained on 2 lts due to sedsat with OT earlier.  HR  increased from 96 to 124 and sats on 2 lts avg 89%.  VC's for deep breathing.  pt taking shallow mouth breaths.  Unsteady gait but tolerated increased distance.     Stairs            Wheelchair Mobility    Modified Rankin (Stroke Patients Only)       Balance                                    Cognition Arousal/Alertness: Awake/alert Behavior During Therapy: WFL for tasks assessed/performed Overall Cognitive Status: History of cognitive impairments - at baseline                      Exercises      General Comments        Pertinent Vitals/Pain Pain Assessment: No/denies pain    Home Living   Living Arrangements: Other relatives (lives with his sister, [name])                  Prior Function            PT Goals (current goals can now be found in the care plan section) Acute Rehab PT Goals Patient Stated Goal: none stated Progress towards PT goals: Progressing toward goals    Frequency  Min 3X/week    PT Plan Current plan remains appropriate    Co-evaluation  End of Session Equipment Utilized During Treatment: Gait belt;Oxygen Activity Tolerance: Patient tolerated treatment well Patient left: in chair;with call bell/phone within reach;with chair alarm set;with nursing/sitter in room;with family/visitor present     Time: [id number] PT Time Calculation (min) (ACUTE ONLY): 26 min  Charges:  $Gait Training: 8-22 mins $Therapeutic Activity: 8-22 mins                    G Codes:      Felecia ShellingLori Brittin Janik  PTA [facility]  Acute  Rehab Pager      [phone]

## 2015-06-10 NOTE — Discharge Summary (Signed)
Physician Discharge Summary   Patient ID: Maninder Deboer MRN: [id number] DOB/AGE: [birth year] 40 y.o.  Admit date: 05/16/2015 Discharge date: 06/10/2015  Primary Care Physician:  Wallene Dales, MD  Discharge Diagnoses:     . HCAP (healthcare-associated pneumonia) . Acute respiratory failure with hypoxia ([place]) . Autism . Cognitive deficits . Acute on chronic respiratory failure with hypoxia ([place]) . Chronic diastolic CHF (congestive heart failure) ([place]) . Sinus tachycardia ([place]) . Acute renal failure ([place]) . Sickle cell trait [facility])  Consults:  Pulmonology critical care Inpatient rehabilitation  Recommendations for Outpatient Follow-up:  1. Patient will need prednisone with taper, 85m po x 2 more days, then taper to 30 mg for 3 days, then 20 mg for 3 days, then 10 mg for 3 days then off 2. Please repeat CBC/BMET at next visit   DIET: Dysphagia 3 diet with thin liquids    Allergies:   Allergies  Allergen Reactions  . Peanuts [Peanut Oil] Anaphylaxis     DISCHARGE MEDICATIONS: Current Discharge Medication List    CONTINUE these medications which have NOT CHANGED   Details  acetaminophen (TYLENOL) 160 MG/5ML solution Place 20.3 mLs (650 mg total) into feeding tube every 4 (four) hours as needed for mild pain, headache or fever. Qty: 120 mL, Refills: 0    albuterol (PROVENTIL) (2.5 MG/3ML) 0.083% nebulizer solution Take 2.5 mg by nebulization every 6 (six) hours as needed for wheezing or shortness of breath.    chlorhexidine gluconate, SAGE KIT, (PERIDEX) 0.12 % solution 15 mLs by Mouth Rinse route 2 (two) times daily. Qty: 120 mL, Refills: 0    docusate (COLACE) 50 MG/5ML liquid Place 10 mLs (100 mg total) into feeding tube daily. Qty: 100 mL, Refills: 0    heparin 5000 UNIT/ML injection Inject 1 mL (5,000 Units total) into the skin every 8 (eight) hours. Qty: 1 mL    insulin aspart (NOVOLOG) 100 UNIT/ML injection Inject 2-6 Units into the skin every 4 (four)  hours. Qty: 10 mL, Refills: 11    ipratropium-albuterol (DUONEB) 0.5-2.5 (3) MG/3ML SOLN Take 3 mLs by nebulization 3 (three) times daily. Qty: 360 mL, Refills: 3    pantoprazole sodium (PROTONIX) 40 mg/20 mL PACK Place 20 mLs (40 mg total) into feeding tube daily at 12 noon. Qty: 30 each    polyethylene glycol (MIRALAX / GLYCOLAX) packet Take 17 g by mouth daily as needed for moderate constipation. Qty: 14 each, Refills: 0      STOP taking these medications     Amino Acids-Protein Hydrolys (FEEDING SUPPLEMENT, PRO-STAT SUGAR FREE 64,) LIQD      clonazePAM (KLONOPIN) 1 MG tablet      fentaNYL (SUBLIMAZE) 100 MCG/2ML injection      meropenem 500 mg in sodium chloride 0.9 % 50 mL      Nutritional Supplements (FEEDING SUPPLEMENT, VITAL HIGH PROTEIN,) LIQD liquid        Inpatient medication to continue prednisone with taper, 433mpo x 2 more days, then taper to 30 mg for 3 days, then 20 mg for 3 days, then 10 mg for 3 days then off  Please continue inpatient and on discharge ZYPREXA 5MG PO BID KLONOPIN 0.5MG AT BEDTIME    Brief H and P: For complete details please refer to admission H and P, but in brief [age].o. M with h/o autism and sickle cell trait who previously underwent a prolonged hospitalization for PNA with ARDS s/p tracheostomy then eventual decannulaton w/ transfer to rehab. He developed progressive dyspnea and  hypoxia leading to respiratory failure from MRSA pneumonia, and had to be readmitted to the acute hospital on 05/16/15.   Significant Events: 5/05 Tx from rehab to [facility] with respiratory failure 5/09 To ICU > VDRF 5/18 Transfuse PRBC, changed to diprivan 5/19 Added solumedrol 5/20 Transfuse PRBC  Hospital Course:  Acute Hypoxic Respiratory Failure due to MRSA Pneumonia and pulmonary edema  - DC IV Solu-Medrol, transitioned to oral prednisone on 5/29, continue gradual weaning -Continue prednisone with taper, 17m po x 2 more days, then taper to 30 mg for 3  days, then 20 mg for 3 days, then 10 mg for 3 days then off - Patient has completed the course of antibiotics.  - leukocytosis had been declining however  Patient intermittently refuses lab draws  - Speech therapy following closely, advanced diet to dysphagia 3, thin liquids on 5/29  Septic Shock due to PNA resolved  Acute Renal Failure - Resolved  Hypernatremia - Improved  Hypokalemia, hypomagnesemia - Replaced  Dysphagia - speech therapy following, advanced diet to dysphagia 3 with thin liquids  - has been intermittently refusing to eat - SLP following   Anemia - Likely anemia of chronic disease, hemoglobin currently stable   Hyperglycemia - On steroids and dextrose IVF -Hemoglobin A1c 6.3  Acute Encephalopathy vs Depression  Psych has evaluated - cont zyprexa per Psych   Physical Deconditioning - PTOT evaluation, return to CIR, and discussed with inpatient rehabilitation  H/O Autism - Mental impairment Reportedly has a very limited vocabulary (~500 words)  Day of Discharge BP 112/76 mmHg  Pulse 100  Temp(Src) 98.4 F (36.9 C) (Oral)  Resp 18  Ht _0  (1.753 m)  Wt 84.1 kg (185 lb 6.5 oz)  BMI 27.37 kg/m2  SpO2 100%  Physical Exam: General: Alert and awake oriented x 2 not in any acute distress, Much more conversant and pleasant HEENT: anicteric sclera, pupils reactive to light and accommodation CVS: S1-S2 clear no murmur rubs or gallops Chest: clear to auscultation bilaterally, no wheezing rales or rhonchi Abdomen: soft nontender, nondistended, normal bowel sounds Extremities: no cyanosis, clubbing or edema noted bilaterally    The results of significant diagnostics from this hospitalization (including imaging, microbiology, ancillary and laboratory) are listed below for reference.    LAB RESULTS: Basic Metabolic Panel:  Recent Labs Lab 06/06/15 0537  06/09/15 1445 06/10/15 0455  NA 149*  < > 141 139  K 3.3*  < > 3.3* 3.3*  CL 108  < >  108 102  CO2 34*  < > 26 28  GLUCOSE 109*  < > 176* 103*  BUN 18  < > 11 13  CREATININE 1.12  < > 0.83 0.78  CALCIUM 8.6*  < > 8.0* 8.8*  MG 1.6*  --   --   --   PHOS 3.0  --   --   --   < > = values in this interval not displayed. Liver Function Tests:  Recent Labs Lab 06/06/15 0537 06/07/15 0514  AST  --  46*  ALT  --  41  ALKPHOS  --  39  BILITOT  --  0.5  PROT  --  5.4*  ALBUMIN 2.6* 2.5*   No results for input(s): LIPASE, AMYLASE in the last 168 hours. No results for input(s): AMMONIA in the last 168 hours. CBC:  Recent Labs Lab 06/06/15 0537 06/07/15 0514 06/09/15 1445  WBC 17.2* 12.2* 18.3*  NEUTROABS 12.5*  --   --   HGB 9.9*  9.2* 10.0*  HCT 32.9* 30.5* 31.4*  MCV 87.0 85.4 82.8  PLT 266 218 254   Cardiac Enzymes: No results for input(s): CKTOTAL, CKMB, CKMBINDEX, TROPONINI in the last 168 hours. BNP: Invalid input(s): POCBNP CBG:  Recent Labs Lab 06/10/15 0757 06/10/15 1201  GLUCAP 79 124*    Significant Diagnostic Studies:  Portable Chest 1 View  06-05-2015  CLINICAL DATA:  Pneumonia EXAM: PORTABLE CHEST 1 VIEW COMPARISON:  May 16, 2015 FINDINGS: Central catheter tip is in the superior vena cava near the cavoatrial junction. No pneumothorax. There is persistent patchy airspace opacity in the mid and lower lung zones bilaterally. Heart is mildly enlarged. The pulmonary vascularity is normal. No adenopathy. IMPRESSION: Suspect bilateral lower lobe pneumonia, although pulmonary edema could present similarly. There may be a combination of pneumonia and pulmonary edema present. Heart is mildly enlarged. Central catheter tip in superior vena cava near cavoatrial junction. Electronically Signed   By: Lowella Grip III M.D.   On: Jun 05, 2015 08:43   Dg Chest Port 1 View  05/16/2015  CLINICAL DATA:  Shortness of Breath with worsening today, cough EXAM: PORTABLE CHEST 1 VIEW COMPARISON:  05/15/2015 FINDINGS: Cardiomediastinal silhouette is stable. Persistent  mild interstitial prominence suspicious for mild interstitial edema. Again noted bilateral basilar atelectasis or infiltrate. There is right arm PICC line with tip in right atrium. For distal SVC position the PICC line should be retracted about 2.1 cm. No pneumothorax. IMPRESSION: Persistent mild interstitial prominence suspicious for mild interstitial edema. Again noted bilateral basilar atelectasis or infiltrate. There is right arm PICC line with tip in right atrium. For distal SVC position the PICC line should be retracted about 2.1 cm. Electronically Signed   By: Lahoma Crocker M.D.   On: 05/16/2015 10:41    2D ECHO:   Disposition and Follow-up:    DISPOSITION: Inpatient rehabilitation   DISCHARGE FOLLOW-UP Follow-up Information    Follow up with ASRES,ALEHEGN, MD. Schedule an appointment as soon as possible for a visit in 2 weeks.   Specialty:  Family Medicine   Why:  for hospital follow-up   Contact information:   [address] [place] [address] [phone]        Time spent on Discharge: 35 minute  Signed:   RAI,RIPUDEEP M.D. [facility] 06/10/2015, 2:59 PM Pager: [phone]

## 2015-06-10 NOTE — Progress Notes (Signed)
Pt admitted to unit from 5w at 1815 with sister at bedside. Pt A&Ox2 with pt stating, "I don't feel well." 2L [place] in place with VS stable. Sister concerned about pt change in WBC count with concerns for pt regarding stability for rehab.  Sister requesting to check VS and temp frequently throughout night. RN will pass along in report of recommendation. Bed alarm in place, SRx3, call bell within reach and sister at bedside.

## 2015-06-10 NOTE — Progress Notes (Signed)
Report given to CIR nurse. Pt. To be transferred to [facility] shortly.

## 2015-06-10 NOTE — PMR Pre-admission (Signed)
PMR Admission Coordinator Pre-Admission Assessment  Patient: Juan Cameron is an 40 y.o., male MRN: [id number] DOB: November 18, 1975 Height: 5' 9"  (175.3 cm) Weight: 84.1 kg (185 lb 6.5 oz)              Insurance Information HMO:     PPO:      PCP:      IPA:      80/20: yes     OTHER: no HMO PRIMARY: Medicare a and b      Policy#: [id number]      Subscriber: pt Benefits:  Phone #: online passport one     Name:  Eff. Date: 04/11/97     Deduct: $1316      Out of Pocket Max: none      Life Max: none CIR: 100%      SNF: 20 full days Outpatient: 80%     Co-Pay: 20% Home Health: 100%      Co-Pay: none DME: 80%     Co-Pay: 20% Providers: pt choice  SECONDARY: Medicaid [place] [employer]      Policy#: [id number]      Subscriber: pt Benefits:  Phone #: [phone]     Name: 06/10/15 Eligible MADQY Active 3/87/56  Medicaid Application Date:       Case Manager:  Disability Application Date:       Case Worker:   Emergency Contact Information Contact Information    Name Relation Home Work Mobile   Worthey,[name] Sister   [phone]   [name]   [phone]   [name] Relative   [phone]     Current Medical History  Patient Admitting Diagnosis: debility related to Respiratory Failure  History of Present Illness: 40 year old right handed African-American male with past medical history significant for autism with element of attention deficit hyperactivity disorder as well as sickle cell trait. Patient lives with his sister and attends adult daycare workshop daily. Patient originally admitted to [facility] on 02/28/15  for multilobar pneumonia and discharged to home on 03/10/2015 and still using 4 L of oxygen. He was readmitted back to the hospital 03/11/2015 with respiratory failure and ARDS. Patient did require intubation placed on broad-spectrum antibiotics. His hospital stay was complicated by acute kidney injury with dialysis initiated that he did receive a short time and  creatinine has now returned back to normal levels at 1.11. Required tracheostomy 03/20/2015 and after failed extubation [facility]2015. Placed on Provigil for mood stabilization suspect encephalopathy with noted history of autism with good results. He was ultimately decannulated 04/25/2015. His diet was slowly advanced to a regular consistency .Bouts of tachycardia atrial fibrillation converted back to normal sinus rhythm maintained on Cardizem as well as Lopressor. Vancomycin-resistant enterococci urinary tract infection treated and remains on contact precautions. Patient still with generalized weakness therapies initiated. Patient was admitted for a comprehensive rehabilitation program 05/06/2015. He was ambulating up to 10 feet without assistive device. Needed encouragement to participate in relation to his autism. On 05/16/2015 periodic bouts of hypoxia into the high 70s and low 80s and pulmonary services continue to follow. Chest x-ray showed persistent mild interstitial prominence suspicious for mild interstitial edema. Bilateral basilar atelectasis versus infiltrate. Patient had been placed on antibiotic care. On the morning of 05/16/2015 in need of nonrebreather mask for decreasing saturations. Due to hypoxic event patient was discharged to acute care services for ongoing evaluation and monitoring. Necessitated need for ventilator support for a short time. Responded well  to intravenous Solu-Medrol. Patient's acute respiratory failure felt to be due to MRSA pneumonia pulmonary edema. Maintained on contact precautions. Diet slowly advanced to mechanical soft thin liquids. Subcutaneous heparin for DVT prophylaxis. Psychiatry was consulted 06/05/2015 per patient's autism, depression and initially maintained on Seroquel changed to Zyprexa. Therapies have been resumed patient with better participation with staff.   Past Medical History  Past Medical  History  Diagnosis Date  . Autism   . Sickle cell trait ([place])   . Pneumonia 02/2015    Family History  family history includes Heart attack in his mother.  Prior Rehab/Hospitalizations:  Has the patient had major surgery during 100 days prior to admission? Yes   Admitted [facility] 02/28/15 until 03/10/15 d/c'd home Readmitted 03/11/15 to [facility] until d/c to [facility] 04/07/15 Admitted [facility] 05/06/15 from [place] 05/06/15 until 05/16/15 when readmitted to [facility] due to respiratory issues [facility] 05/16/15 until 06/10/15 when readmitted to [facility] CIR.  Current Medications   Current facility-administered medications:  .  acetaminophen (TYLENOL) solution 650 mg, 650 mg, Oral, Q6H PRN, Javier Glazier, MD, 650 mg at 06/08/15 1009 .  acetaminophen (TYLENOL) suppository 650 mg, 650 mg, Rectal, Q6H PRN, Kara Mead V, MD, 650 mg at 06/04/15 2218 .  antiseptic oral rinse solution (CORINZ), 7 mL, Mouth Rinse, QID, Rush Farmer, MD, 7 mL at 06/10/15 1200 .  chlorhexidine gluconate (SAGE KIT) (PERIDEX) 0.12 % solution 15 mL, 15 mL, Mouth Rinse, BID, Rush Farmer, MD, 15 mL at 06/10/15 0800 .  clonazePAM (KLONOPIN) tablet 0.5 mg, 0.5 mg, Oral, QHS, Cherene Altes, MD, 0.5 mg at 06/09/15 2316 .  dextrose 5 % with KCl 20 mEq / L  infusion, 20 mEq, Intravenous, Continuous, Cherene Altes, MD, Last Rate: 50 mL/hr at 06/10/15 0822, 20 mEq at 06/10/15 1194 .  ferrous sulfate tablet 325 mg, 325 mg, Oral, BID WC, Javier Glazier, MD, 325 mg at 06/10/15 0800 .  heparin injection 5,000 Units, 5,000 Units, Subcutaneous, Q8H, Collene Gobble, MD, 5,000 Units at 06/10/15 0542 .  insulin aspart (novoLOG) injection 0-20 Units, 0-20 Units, Subcutaneous, TID WC, Javier Glazier, MD, 3 Units at 06/10/15 1200 .  insulin aspart (novoLOG) injection 0-5 Units, 0-5 Units, Subcutaneous, QHS, Javier Glazier, MD, 0 Units at 06/04/15 2200 .  ipratropium-albuterol  (DUONEB) 0.5-2.5 (3) MG/3ML nebulizer solution 3 mL, 3 mL, Nebulization, Q2H PRN, Chesley Mires, MD .  labetalol (NORMODYNE,TRANDATE) injection 10-20 mg, 10-20 mg, Intravenous, Q2H PRN, Kara Mead V, MD, 10 mg at 06/04/15 2219 .  OLANZapine zydis (ZYPREXA) disintegrating tablet 5 mg, 5 mg, Oral, BID, Ambrose Finland, MD, 5 mg at 06/10/15 1000 .  ondansetron (ZOFRAN) injection 4 mg, 4 mg, Intravenous, Q6H PRN, Collene Gobble, MD, 4 mg at 06/05/15 2148 .  pantoprazole (PROTONIX) EC tablet 40 mg, 40 mg, Oral, Daily, Kendra P Hiatt, RPH, 40 mg at 06/10/15 1000 .  phenol (CHLORASEPTIC) mouth spray 1 spray, 1 spray, Mouth/Throat, PRN, Javier Glazier, MD .  polyethylene glycol (MIRALAX / GLYCOLAX) packet 17 g, 17 g, Per Tube, Daily PRN, Jake Church Masters, RPH .  predniSONE (DELTASONE) tablet 40 mg, 40 mg, Oral, Q breakfast, Ripudeep K Rai, MD, 40 mg at 06/10/15 0800 .  RESOURCE THICKENUP CLEAR, , Oral, PRN, Rush Farmer, MD .  sodium chloride flush (NS) 0.9 % injection 10-40 mL, 10-40 mL, Intracatheter, PRN, Colbert Coyer, MD, 10 mL at  06/08/15 2336 .  vitamin C (ASCORBIC ACID) tablet 250 mg, 250 mg, Oral, BID WC, Javier Glazier, MD, 250 mg at 06/10/15 0800 .  white petrolatum (VASELINE) gel, , Topical, PRN, Kara Mead V, MD, 0.2 application at 96/29/52 1508  Patients Current Diet: DIET DYS 3 Room service appropriate?: Yes; Fluid consistency:: Thin  Precautions / Restrictions Precautions Precautions: Fall Precaution Comments: Watch HR and O2 sats. Restrictions Weight Bearing Restrictions: No   Has the patient had 2 or more falls or a fall with injury in the past year?No  Prior Activity Level Pt was independent without AD pta 02/28/15. Attended workshop Monday through Friday. Lives with his sister, [name] who is his legal guardian. Loves to bowl, video games, etc.   [place] Devices/Equipment: None  Prior Device Use: Indicate  devices/aids used by the patient prior to current illness, exacerbation or injury? None of the above  Prior Functional Level Prior Function Level of Independence: Independent (Prior to  recent hospitalizations.  ) Comments: (information taken from chart review) Loves bowling and video games  Self Care: Did the patient need help bathing, dressing, using the toilet or eating?  Independent  Indoor Mobility: Did the patient need assistance with walking from room to room (with or without device)? Independent  Stairs: Did the patient need assistance with internal or external stairs (with or without device)? Independent  Functional Cognition: Did the patient need help planning regular tasks such as shopping or remembering to take medications? Needed some assistance.   Current Functional Level Cognition  Overall Cognitive Status: History of cognitive impairments - at baseline Orientation Level: Oriented to person, Oriented to place    Extremity Assessment (includes Sensation/Coordination)  Upper Extremity Assessment: Generalized weakness  Lower Extremity Assessment: Defer to PT evaluation    ADLs  Overall ADL's : Needs assistance/impaired Grooming: Wash/dry hands, Wash/dry face, Oral care, Sitting, Set up Upper Body Bathing: Maximal assistance, Sitting Lower Body Bathing: Maximal assistance, Sit to/from stand, Bed level Upper Body Dressing : Maximal assistance, Sitting Lower Body Dressing: Maximal assistance, Sit to/from stand Lower Body Dressing Details (indicate cue type and reason): Pt able to assist with donning socks  Toilet Transfer: Minimal assistance, Stand-pivot (held IV pole) Toilet Transfer Details (indicate cue type and reason): simulated bed to chair Toileting- Clothing Manipulation and Hygiene: Total assistance, Bed level Functional mobility during ADLs: Moderate assistance, +2 for physical assistance General ADL Comments: Pt immediately willing to get OOB, cooperative  with ADL and use of incentive spirometer.    Mobility  Overal bed mobility: Needs Assistance Bed Mobility: Supine to Sit Supine to sit: Supervision Sit to supine: Min assist, +2 for physical assistance General bed mobility comments: OOB in recliner    Transfers  Overall transfer level: Needs assistance Equipment used: 1 person hand held assist Transfers: Sit to/from Stand Sit to Stand: Min guard Stand pivot transfers: Min assist General transfer comment: repeat VC's to increase self particiaption.      Ambulation / Gait / Stairs / Wheelchair Mobility  Ambulation/Gait Ambulation/Gait assistance: Museum/gallery curator (Feet): 52 Feet Assistive device: Rolling walker (2 wheeled) Gait Pattern/deviations: Step-to pattern, Decreased step length - right, Decreased step length - left General Gait Details: remained on 2 lts due to sedsat with OT earlier.  HR increased from 96 to 124 and sats on 2 lts avg 89%.  VC's for deep breathing.  pt taking shallow mouth breaths.  Unsteady gait but tolerated increased distance.  Gait velocity: decreased    Posture / Balance Dynamic Sitting Balance Sitting balance - Comments: pt leans posteriorly, but at times did note pt was able to support himself.   Balance Overall balance assessment: Needs assistance Sitting-balance support: No upper extremity supported, Feet supported Sitting balance-Leahy Scale: Fair Sitting balance - Comments: pt leans posteriorly, but at times did note pt was able to support himself.   Postural control: Posterior lean Standing balance support: Single extremity supported Standing balance-Leahy Scale: Poor Standing balance comment: Pt requires max assist to stand statically with 2 person assist and unable to achieve full upright position.    Special needs/care consideration Oxygen at 2-3 liters nasal cannula IVF at 50 cc/hr Skin left ear stage II Bowel mgmt: continent LBM 5/27 Bladder mgmt: indwelling  catheter Prefers male caregivers Mentally handicapped/needs direct simple instructions. Home oxygen equipment education needed and clarification of qualifying diagnosis for coverage with Medicare and Medicaid prior to d/c.    Previous Home Environment Living Arrangements: Other relatives (lives with his sister, [name])  Lives With: Family Available Help at Discharge: Family Type of Home: House Home Layout: One level Home Access: Stairs to enter Entrance Stairs-Rails: None Technical brewer of Steps: 3 Bathroom Shower/Tub: Public librarian, Architectural technologist: Programmer, systems: Yes How Accessible: Accessible via walker [place]: No Additional Comments: Obtained info via chart as no family present and pt unable to provide information due to baseline cognition  Discharge Living Setting Plans for Discharge Living Setting: Patient's home, Lives with (comment) (lives with his sister, [name]) Type of Home at Discharge: House Discharge Home Layout: One level Discharge Home Access: Stairs to enter Entrance Stairs-Rails: None Entrance Stairs-Number of Steps: 2-3 Discharge Bathroom Shower/Tub: Tub/shower unit, Curtain Discharge Bathroom Toilet: Standard Discharge Bathroom Accessibility: Yes How Accessible: Accessible via walker Does the patient have any problems obtaining your medications?: No  Social/Family/Support Systems Patient Roles: Other (Comment) Contact Information: [name], sister and legal guardian Anticipated Caregiver: sister and other family/friends Anticipated Caregiver's Contact Information: [name], sister and legal guardian Ability/Limitations of Caregiver: [name] now works 8 a until 4 pm daiy as Human resources officer. She has taken medical withdrawal form school due to pt's medical issues Caregiver Availability: 24/7 Discharge Plan Discussed with Primary Caregiver: Yes Is Caregiver In Agreement with Plan?: Yes Does  Caregiver/Family have Issues with Lodging/Transportation while Pt is in Rehab?: No [name] plans to check into PCS aide to assist as well as family/friends to provide 24/7 supervision initially at d/c home.   She is aware that pt likely to d/c home with oxygen. Sister states when pt initially d/c'd home 03/10/15, [place] delivered two portable O2 tanks and a concentrator. The representative stated to her that pt did not have a qualifying diagnosis for the oxygen, therefore she had a monthly copay. There also was  a discrepancy of tanks delivered and concentrator during that less than 24 hrs home before readmitted due to desaturations and respiratory issues. Home oxygen equipment and education of use needed prior to d/c home.  Goals/Additional Needs Patient/Family Goal for Rehab: supervision with PT, OT, and SLP Expected length of stay: [facility] 7- 10 days Special Service Needs: Patient mentally handicapped and autistic. Limited vocabulary of about 500 words per family. needs simple explanations and timing. prefers male caregivers  Additional Information: Pt autistic and mentally handicapped. Devleopment age of 3 or 69 year old per Aunt [name] Pt/Family Agrees to Admission and willing to participate: Yes Program Orientation Provided & Reviewed with Pt/Caregiver  Including Roles  & Responsibilities: Yes  Decrease burden of Care through IP rehab admission: n/a  Possible need for SNF placement upon discharge:ot anticipated. I spoke with [name] on 06/09/15 and 06/10/15 reviewed goals for a readmission to inpt rehab. Goals would be supervision level and that she and family/friends would need to coordinate that coverage. I also discussed that pt would likely go home with home oxygen.  Patient Condition: The patient's medical and functional level have been reviewed by myself, the Physician Assistant and the Rehabilitation physician. Patient's condition is appropriate for intensive rehabilitative care in  an inpatient rehabilitation facility. See history of present illness for medical update. Patient is functionally min to mod assist overall. Patient is appropirate for admission today.  Preadmission Screen Completed By:  Cleatrice Burke, 06/10/2015 3:03 PM ______________________________________________________________________   Discussed status with Dr. Naaman Plummer on 06/10/2015 at 1501 and received telephone approval for admission today.  Admission Coordinator:  Cleatrice Burke, time 2080  Date 06/10/2015.

## 2015-06-10 NOTE — Progress Notes (Signed)
Occupational Therapy Treatment Patient Details Name: Juan RipperRobert Cameron MRN: [id number] DOB: 1975-04-08 Today's Date: 06/10/2015    History of present illness pt presents from CIR with Respiratory Failure.  pt intubated 5/9 - 5/20 this admit.  pt with multiple recent admits for respiratory difficulties with most recent admit being [facility] to [facility] for prolonged weaning from vent and then to CIR for further rehab.  pt with hx of Autism, ADHD, and Sickle Cell.     OT comments  Pt with 02 sat at 81% on RA, 95% on 2L and 88% on 1L, HR 104. RN notified that pt was left on 2L. Encouraged incentive spirometer use. Performing seated grooming with set up. Min assist for stand-pivot transfer. Pt is progressing steadily.  Follow Up Recommendations  CIR    Equipment Recommendations  None recommended by OT    Recommendations for Other Services      Precautions / Restrictions Precautions Precautions: Fall Precaution Comments: Watch HR and O2 sats. Restrictions Weight Bearing Restrictions: No       Mobility Bed Mobility Overal bed mobility: Needs Assistance Bed Mobility: Supine to Sit     Supine to sit: Supervision     General bed mobility comments: no physical assist, HOB nearly flat, assist for line management  Transfers Overall transfer level: Needs assistance Equipment used: 1 person hand held assist (and IV pole) Transfers: Sit to/from Stand;Stand Pivot Transfers Sit to Stand: Min assist Stand pivot transfers: Min assist       General transfer comment: assist to steady only    Balance                                   ADL Overall ADL's : Needs assistance/impaired     Grooming: Wash/dry hands;Wash/dry face;Oral care;Sitting;Set up                   Toilet Transfer: Minimal assistance;Stand-pivot (held IV pole) Toilet Transfer Details (indicate cue type and reason): simulated bed to chair           General ADL Comments: Pt immediately  willing to get OOB, cooperative with ADL and use of incentive spirometer.      Vision                     Perception     Praxis      Cognition   Behavior During Therapy: WFL for tasks assessed/performed Overall Cognitive Status: History of cognitive impairments - at baseline                       Extremity/Trunk Assessment               Exercises     Shoulder Instructions       General Comments      Pertinent Vitals/ Pain       Pain Assessment: No/denies pain  Home Living                                          Prior Functioning/Environment              Frequency Min 3X/week     Progress Toward Goals  OT Goals(current goals can now be found in the care plan section)  Progress towards OT goals:  Progressing toward goals  Acute Rehab OT Goals Patient Stated Goal: none stated Time For Goal Achievement: 06/16/15 Potential to Achieve Goals: Good  Plan Discharge plan remains appropriate    Co-evaluation                 End of Session Equipment Utilized During Treatment: Oxygen   Activity Tolerance     Patient Left in chair;with call bell/phone within reach;with chair alarm set   Nurse Communication Mobility status (02 sats )        Time: 0981-1914 OT Time Calculation (min): 27 min  Charges: OT General Charges $OT Visit: 1 Procedure OT Treatments $Self Care/Home Management : 23-37 mins  Evern Bio 06/10/2015, 12:44 PM  [phone]

## 2015-06-10 NOTE — H&P (Signed)
Physical Medicine and Rehabilitation Admission H&P    Chief complaint: Cough   HPI: 40 year old right handed African-American male with past medical history significant for autism with element of attention deficit hyperactivity disorder as well as sickle cell trait. Patient lives with his sister and attends adult daycare workshop daily. Independent prior to admission. Patient originally admitted to [facility] for multilobar pneumonia and discharged to home on 03/10/2015 and still using 4 L of oxygen. He was admitted back to the hospital 04/07/2015 with respiratory failure and ARDS. Patient did require intubation placed on broad-spectrum antibiotics. His hospital stay was complicated by acute kidney injury with dialysis initiated that he did receive a short time and creatinine has now returned back to normal levels at 1.11. Required tracheostomy 03/20/2015 and after failed extubation [facility]. Placed on Provigil for mood stabilization suspect encephalopathy with noted history of autism with good results. He was ultimately decannulated 04/25/2015. His diet was slowly advanced to a regular consistency bouts of tachycardia atrial fibrillation converted back to normal sinus rhythm maintained on Cardizem as well as Lopressor. Vancomycin-resistant enterococci urinary tract infection treated and remains on contact precautions. Patient still with generalized weakness therapies initiated. Patient was admitted for a comprehensive rehabilitation program 05/06/2015. He was ambulating up to 10 feet without assistive device. Needed encouragement to participate in relation to his autism. On 05/16/2015 periodic bouts of hypoxia into the high 70s and low 80s and pulmonary services continue to follow. Chest x-ray showed persistent mild interstitial prominence suspicious for mild interstitial edema. Bilateral basilar atelectasis versus infiltrate.  Patient had been placed on antibiotic care. On the morning of 05/16/2015 in need of nonrebreather mask for decreasing saturations. Due to hypoxic event patient was discharged to acute care services for ongoing evaluation and monitoring. Necessitated need for ventilator support for a short time. Responded well to intravenous Solu-Medrol. Patient's acute respiratory failure felt to be due to MRSA pneumonia pulmonary edema. Maintained on contact precautions. Diet slowly advanced to mechanical soft thin liquids. Subcutaneous heparin for DVT prophylaxis. Psychiatry was consulted 06/05/2015 per patient's autism, depression and initially maintained on Seroquel changed to Zyprexa. Therapies have been resumed patient with better participation with staff. Patient was admitted to inpatient rehabilitation services in regards to debilitation related to hypoxia for a comprehensive rehabilitation program.  ROS Constitutional: Negative for fever and chills.  HENT: Negative for hearing loss.  Eyes: Negative for blurred vision and double vision.  Respiratory: Positive for cough and shortness of breath.  Cardiovascular: Positive for leg swelling. Negative for chest pain and palpitations.  Gastrointestinal: Positive for constipation. Negative for nausea and vomiting.  Genitourinary: Negative for dysuria and hematuria.  Skin: Negative for rash.  Neurological: Positive for weakness. Negative for seizures, loss of consciousness and headaches.  Psychiatric/Behavioral:   Severe autism with element of attention deficit hyperactivity disorder  All other systems reviewed and are negative   Past Medical History  Diagnosis Date  . Autism   . Sickle cell trait ([place])   . Pneumonia 02/2015   Past Surgical History  Procedure Laterality Date  . No past surgeries     Family History  Problem Relation Age of Onset  . Heart attack Mother    Social History:  reports that he has never smoked. He has never used smokeless  tobacco. He reports that he does not drink alcohol or use illicit drugs. Allergies:  Allergies  Allergen Reactions  . Peanuts [Peanut  Oil] Anaphylaxis   Medications Prior to Admission  Medication Sig Dispense Refill  . acetaminophen (TYLENOL) 160 MG/5ML solution Place 20.3 mLs (650 mg total) into feeding tube every 4 (four) hours as needed for mild pain, headache or fever. 120 mL 0  . albuterol (PROVENTIL) (2.5 MG/3ML) 0.083% nebulizer solution Take 2.5 mg by nebulization every 6 (six) hours as needed for wheezing or shortness of breath.    . Amino Acids-Protein Hydrolys (FEEDING SUPPLEMENT, PRO-STAT SUGAR FREE 64,) LIQD Place 30 mLs into feeding tube 2 (two) times daily. 900 mL 0  . chlorhexidine gluconate, SAGE KIT, (PERIDEX) 0.12 % solution 15 mLs by Mouth Rinse route 2 (two) times daily. 120 mL 0  . clonazePAM (KLONOPIN) 1 MG tablet Take 1 tablet (1 mg total) by mouth 2 (two) times daily. 30 tablet 0  . docusate (COLACE) 50 MG/5ML liquid Place 10 mLs (100 mg total) into feeding tube daily. 100 mL 0  . fentaNYL (SUBLIMAZE) 100 MCG/2ML injection Inject 0.5-1 mLs (25-50 mcg total) into the vein every 2 (two) hours as needed for severe pain. 2 mL 0  . heparin 5000 UNIT/ML injection Inject 1 mL (5,000 Units total) into the skin every 8 (eight) hours. 1 mL   . insulin aspart (NOVOLOG) 100 UNIT/ML injection Inject 2-6 Units into the skin every 4 (four) hours. 10 mL 11  . ipratropium-albuterol (DUONEB) 0.5-2.5 (3) MG/3ML SOLN Take 3 mLs by nebulization 3 (three) times daily. 360 mL 3  . meropenem 500 mg in sodium chloride 0.9 % 50 mL Inject 500 mg into the vein daily.    . Nutritional Supplements (FEEDING SUPPLEMENT, VITAL HIGH PROTEIN,) LIQD liquid Place 1,000 mLs into feeding tube daily.    . pantoprazole sodium (PROTONIX) 40 mg/20 mL PACK Place 20 mLs (40 mg total) into feeding tube daily at 12 noon. 30 each   . polyethylene glycol (MIRALAX / GLYCOLAX) packet Take 17 g by mouth daily as needed  for moderate constipation. 14 each 0    Home: Home Living Family/patient expects to be discharged to:: Inpatient rehab Living Arrangements: Other relatives Available Help at Discharge: Family Type of Home: House Home Access: Stairs to enter Technical brewer of Steps: 3 Entrance Stairs-Rails: None Home Layout: One level Bathroom Shower/Tub: Tub/shower unit, Industrial/product designer: Yes Additional Comments: Obtained info via chart as no family present and pt unable to provide information due to baseline cognition  Lives With: Family   Functional History: Prior Function Level of Independence: Independent (Prior to  recent hospitalizations.  ) Comments: (information taken from chart review) Loves bowling and video games  Functional Status:  Mobility: Bed Mobility Overal bed mobility: Needs Assistance, +2 for physical assistance Bed Mobility: Supine to Sit Supine to sit: Min assist, HOB elevated Sit to supine: Min assist, +2 for physical assistance General bed mobility comments: assist to elevate trunk into sitting; increased time and cues for technique Transfers Overall transfer level: Needs assistance Equipment used: 2 person hand held assist Transfers: Sit to/from Stand Sit to Stand: +2 physical assistance, Min assist Stand pivot transfers: Max assist, +2 physical assistance General transfer comment: X 2 from EOB with assist upon standing for balance; pt used momentum to power up into standing; cues for hand placement and technique Ambulation/Gait Ambulation/Gait assistance: Min assist, +2 physical assistance Ambulation Distance (Feet): 20 Feet (5, 15) Assistive device: 2 person hand held assist Gait Pattern/deviations: Step-to pattern, Decreased step length - right, Decreased step length - left General Gait  Details: very short steps with HHA +2 for balance with no LOB; max encouragement to ambulate and family present helped for  encouragment; HR elevated to 130 and SpO2 decreased to 86% with ambulation but returned to WNL in ~1 minute with seated rest; educated pt on pursed lip breathing technique     ADL: ADL Overall ADL's : Needs assistance/impaired Grooming: Wash/dry face, Wash/dry hands, Sitting, Moderate assistance Upper Body Bathing: Maximal assistance, Sitting Lower Body Bathing: Maximal assistance, Sit to/from stand, Bed level Upper Body Dressing : Maximal assistance, Sitting Lower Body Dressing: Maximal assistance, Sit to/from stand Lower Body Dressing Details (indicate cue type and reason): Pt able to assist with donning socks  Toilet Transfer: Total assistance Toilet Transfer Details (indicate cue type and reason): Pt unable to tolerate standing due to abd pain  Toileting- Clothing Manipulation and Hygiene: Total assistance, Bed level Functional mobility during ADLs: Moderate assistance, +2 for physical assistance General ADL Comments: Pt very engaging, but limited by abdominal pain   Cognition: Cognition Overall Cognitive Status: History of cognitive impairments - at baseline Orientation Level: Oriented to person, Oriented to place Cognition Arousal/Alertness: Awake/alert Behavior During Therapy: WFL for tasks assessed/performed Overall Cognitive Status: History of cognitive impairments - at baseline  Physical Exam: Blood pressure 144/86, pulse 103, temperature 98.4 F (36.9 C), temperature source Oral, resp. rate 17, height 5' 9"  (1.753 m), weight 84.1 kg (185 lb 6.5 oz), SpO2 97 %. Physical Exam  Gen: pleasant, sitting in chair watching TV HENT: trach site healed Head: Normocephalic.  Eyes: Conjunctivae and EOM are normal. Pupils are equal, round, and reactive to light.  Neck: Normal range of motion. Neck supple. No tracheal deviation present. No thyromegaly present.  Cardiovascular: Normal rate and regular rhythm.  Respiratory: Effort normal. No respiratory distress.  GI: Soft. Bowel  sounds are normal. He exhibits no distension Neurological. Patient was alert. He made limited eye contact. Followed simple commands. He would use simple to 3 word phrases to communicate. He could not recall being on on the rehabilitation unit before. Stated that he wanted to go home.. UE strength grossly 4/5 bilaterally prox to diatl. Marland Kitchen Hesitated to move either leg due to bilateral knee pain---strength grossly 4/5 otherwise. Sensation appears to be intact in all 4.  Skin. Intact, no breakdown Psych: pt very flat, distracted   Results for orders placed or performed during the hospital encounter of 05/16/15 (from the past 48 hour(s))  Glucose, capillary     Status: None   Collection Time: 06/08/15  9:56 AM  Result Value Ref Range   Glucose-Capillary 88 65 - 99 mg/dL  Glucose, capillary     Status: Abnormal   Collection Time: 06/08/15 11:32 AM  Result Value Ref Range   Glucose-Capillary 115 (H) 65 - 99 mg/dL  Glucose, capillary     Status: Abnormal   Collection Time: 06/08/15  4:31 PM  Result Value Ref Range   Glucose-Capillary 135 (H) 65 - 99 mg/dL  Glucose, capillary     Status: None   Collection Time: 06/08/15 10:19 PM  Result Value Ref Range   Glucose-Capillary 92 65 - 99 mg/dL   Comment 1 Notify RN    Comment 2 Document in Chart   Glucose, capillary     Status: None   Collection Time: 06/09/15  8:08 AM  Result Value Ref Range   Glucose-Capillary 83 65 - 99 mg/dL  Glucose, capillary     Status: Abnormal   Collection Time: 06/09/15 12:24 PM  Result  Value Ref Range   Glucose-Capillary 102 (H) 65 - 99 mg/dL  CBC     Status: Abnormal   Collection Time: 06/09/15  2:45 PM  Result Value Ref Range   WBC 18.3 (H) 4.0 - 10.5 K/uL   RBC 3.79 (L) 4.22 - 5.81 MIL/uL   Hemoglobin 10.0 (L) 13.0 - 17.0 g/dL   HCT 31.4 (L) 39.0 - 52.0 %   MCV 82.8 78.0 - 100.0 fL   MCH 26.4 26.0 - 34.0 pg   MCHC 31.8 30.0 - 36.0 g/dL   RDW 16.9 (H) 11.5 - 15.5 %   Platelets 254 150 - 400 K/uL  Basic  metabolic panel     Status: Abnormal   Collection Time: 06/09/15  2:45 PM  Result Value Ref Range   Sodium 141 135 - 145 mmol/L   Potassium 3.3 (L) 3.5 - 5.1 mmol/L   Chloride 108 101 - 111 mmol/L   CO2 26 22 - 32 mmol/L   Glucose, Bld 176 (H) 65 - 99 mg/dL   BUN 11 6 - 20 mg/dL   Creatinine, Ser 0.83 0.61 - 1.24 mg/dL   Calcium 8.0 (L) 8.9 - 10.3 mg/dL   GFR calc non Af Amer >60 >60 mL/min   GFR calc Af Amer >60 >60 mL/min    Comment: (NOTE) The eGFR has been calculated using the CKD EPI equation. This calculation has not been validated in all clinical situations. eGFR's persistently <60 mL/min signify possible Chronic Kidney Disease.    Anion gap 7 5 - 15  Glucose, capillary     Status: Abnormal   Collection Time: 06/09/15  5:19 PM  Result Value Ref Range   Glucose-Capillary 125 (H) 65 - 99 mg/dL  Glucose, capillary     Status: Abnormal   Collection Time: 06/09/15 11:20 PM  Result Value Ref Range   Glucose-Capillary 112 (H) 65 - 99 mg/dL  Basic metabolic panel     Status: Abnormal   Collection Time: 06/10/15  4:55 AM  Result Value Ref Range   Sodium 139 135 - 145 mmol/L   Potassium 3.3 (L) 3.5 - 5.1 mmol/L   Chloride 102 101 - 111 mmol/L   CO2 28 22 - 32 mmol/L   Glucose, Bld 103 (H) 65 - 99 mg/dL   BUN 13 6 - 20 mg/dL   Creatinine, Ser 0.78 0.61 - 1.24 mg/dL   Calcium 8.8 (L) 8.9 - 10.3 mg/dL   GFR calc non Af Amer >60 >60 mL/min   GFR calc Af Amer >60 >60 mL/min    Comment: (NOTE) The eGFR has been calculated using the CKD EPI equation. This calculation has not been validated in all clinical situations. eGFR's persistently <60 mL/min signify possible Chronic Kidney Disease.    Anion gap 9 5 - 15  Glucose, capillary     Status: None   Collection Time: 06/10/15  7:57 AM  Result Value Ref Range   Glucose-Capillary 79 65 - 99 mg/dL   No results found.     Medical Problem List and Plan: 1.  Debilitation secondary to respiratory failure/MRSA pneumonia. Contact  precautions. All antibiotic therapy is completed. Taper of by mouth prednisone over 2 weeks.  -begin inpatient rehab 2.  DVT Prophylaxis/Anticoagulation: Subcutaneous heparin. Monitor for any bleeding episodes 3. Pain Management: Tylenol as needed 4. Mood/severe autism: Klonopin 0.5 mg daily at bedtime, Zyprexa 5 mg twice a day. Patient appears to be at his baseline per family 5. Neuropsych: This patient is not capable  of making decisions on his own behalf. 6. Skin/Wound Care: Routine skin checks 7. Fluids/Electrolytes/Nutrition: Routine I&O's with follow-up chemistries 8. Dysphagia. Diet advance to mechanical soft thin liquids. Monitor for any signs of aspiration 9. Acute on chronic anemia. Continue iron supplement. Follow-up CBC 10. History of sickle cell trait.   Post Admission Physician Evaluation: 1. Functional deficits secondary  to debility after multiple medical. 2. Patient is admitted to receive collaborative, interdisciplinary care between the physiatrist, rehab nursing staff, and therapy team. 3. Patient's level of medical complexity and substantial therapy needs in context of that medical necessity cannot be provided at a lesser intensity of care such as a SNF. 4. Patient has experienced substantial functional loss from his/her baseline which was documented above under the "Functional History" and "Functional Status" headings.  Judging by the patient's diagnosis, physical exam, and functional history, the patient has potential for functional progress which will result in measurable gains while on inpatient rehab.  These gains will be of substantial and practical use upon discharge  in facilitating mobility and self-care at the household level. 5. Physiatrist will provide 24 hour management of medical needs as well as oversight of the therapy plan/treatment and provide guidance as appropriate regarding the interaction of the two. 6. 24 hour rehab nursing will assist with bladder  management, bowel management, safety, skin/wound care, disease management, medication administration, pain management and patient education  and help integrate therapy concepts, techniques,education, etc. 7. PT will assess and treat for/with: Lower extremity strength, range of motion, stamina, balance, functional mobility, safety, adaptive techniques and equipment, pain mgt, cognitive behavioral rx.   Goals are: supervision. 8. OT will assess and treat for/with: ADL's, functional mobility, safety, upper extremity strength, adaptive techniques and equipment, ego support, education, cognitive-behavioral rx.   Goals are: supervision. Therapy may proceed with showering this patient. 9. SLP will assess and treat for/with: n/a.  Goals are: n/a. 10. Case Management and Social Worker will assess and treat for psychological issues and discharge planning. 11. Team conference will be held weekly to assess progress toward goals and to determine barriers to discharge. 12. Patient will receive at least 3 hours of therapy per day at least 5 days per week. 13. ELOS: 7 days.  He wil go home with sister.   14. Prognosis:  excellent     Meredith Staggers, MD, [place] [facility] 06/10/2015   06/10/2015

## 2015-06-10 NOTE — Care Management Note (Signed)
Case Management Note  Patient Details  Name: Alan RipperRobert Fiebelkorn MRN: [id number] Date of Birth: 09-27-1975  Subjective/Objective:                 Patient to DC to CIR today.   Action/Plan:   Expected Discharge Date:                  Expected Discharge Plan:  IP Rehab Facility  In-House Referral:  Clinical Social Work  Discharge planning Services  CM Consult  Post Acute Care Choice:    Choice offered to:     DME Arranged:    DME Agency:     HH Arranged:    HH Agency:     Status of Service:  Completed, signed off  Medicare Important Message Given:  Yes Date Medicare IM Given:    Medicare IM give by:    Date Additional Medicare IM Given:    Additional Medicare Important Message give by:     If discussed at Long Length of Stay Meetings, dates discussed:    Additional Comments:  Lawerance SabalDebbie Tameria Patti, RN 06/10/2015, 3:21 PM

## 2015-06-10 NOTE — Interval H&P Note (Signed)
Alan RipperRobert Filo was admitted today to Inpatient Rehabilitation with the diagnosis of debility.  The patient's history has been reviewed, patient examined, and there is no change in status.  Patient continues to be appropriate for intensive inpatient rehabilitation.  I have reviewed the patient's chart and labs.  Questions were answered to the patient's satisfaction. The PAPE has been reviewed and assessment remains appropriate.  Danel Studzinski T 06/10/2015, 6:01 PM

## 2015-06-10 NOTE — Progress Notes (Signed)
Cristina Gong, RN Rehab Admission Coordinator Signed Physical Medicine and Rehabilitation PMR Pre-admission 06/10/2015 1:50 PM  Related encounter: Admission (Current) from 05/16/2015 in [place] Collapse All   PMR Admission Coordinator Pre-Admission Assessment  Patient: Juan Cameron is an 40 y.o., male MRN: [id number] DOB: [birth year] Height: _0  (175.3 cm) Weight: 84.1 kg (185 lb 6.5 oz)  Insurance Information HMO: PPO: PCP: IPA: 80/20: yes OTHER: no HMO PRIMARY: Medicare a and b Policy#: [id number] c2 Subscriber: pt Benefits: Phone #: online passport one Name:  Eff. Date: 04/11/97 Deduct: $1316 Out of Pocket Max: none Life Max: none CIR: 100% SNF: 20 full days Outpatient: 80% Co-Pay: 20% Home Health: 100% Co-Pay: none DME: 80% Co-Pay: 20% Providers: pt choice  SECONDARY: Medicaid [place] Access Policy#: [id number] Subscriber: pt Benefits: Phone #: [phone] Name: 06/10/15 Eligible MADQY Active 0/86/76  Medicaid Application Date: Case Manager:  Disability Application Date: Case Worker:   Emergency Contact Information Contact Information    Name Relation Home Work Mobile   Krahn,[name] Sister   [phone]   [name]   [phone]   [name] Relative   [phone]     Current Medical History  Patient Admitting Diagnosis: debility related to Respiratory Failure  History of Present Illness: 40 year old right handed African-American male with past medical history significant for autism with element of attention deficit hyperactivity disorder as well as sickle cell trait. Patient lives with his sister and attends adult daycare workshop daily. Patient  originally admitted to [facility] on 02/28/15 for multilobar pneumonia and discharged to home on 03/10/2015 and still using 4 L of oxygen. He was readmitted back to the hospital 03/11/2015 with respiratory failure and ARDS. Patient did require intubation placed on broad-spectrum antibiotics. His hospital stay was complicated by acute kidney injury with dialysis initiated that he did receive a short time and creatinine has now returned back to normal levels at 1.11. Required tracheostomy 03/20/2015 and after failed ext[facility]. Placed on Provigil for mood stabilization suspect encephalopathy with noted history of autism with good results. He was ultimately decannulated 04/25/2015. His diet was slowly advanced to a regular consistency .Bouts of tachycardia atrial fibrillation converted back to normal sinus rhythm maintained on Cardizem as well as Lopressor. Vancomycin-resistant enterococci urinary tract infection treated and remains on contact precautions. Patient still with generalized weakness therapies initiated. Patient was admitted for a comprehensive rehabilitation program 05/06/2015. He was ambulating up to 10 feet without assistive device. Needed encouragement to participate in relation to his autism. On 05/16/2015 periodic bouts of hypoxia into the high 70s and low 80s and pulmonary services continue to follow. Chest x-ray showed persistent mild interstitial prominence suspicious for mild interstitial edema. Bilateral basilar atelectasis versus infiltrate. Patient had been placed on antibiotic care. On the morning of 05/16/2015 in need of nonrebreather mask for decreasing saturations. Due to hypoxic event patient was discharged to acute care services for ongoing evaluation and monitoring. Necessitated need for ventilator support for a short time. Responded well to intravenous Solu-Medrol. Patient's acute respiratory failure  felt to be due to MRSA pneumonia pulmonary edema. Maintained on contact precautions. Diet slowly advanced to mechanical soft thin liquids. Subcutaneous heparin for DVT prophylaxis. Psychiatry was consulted 06/05/2015 per patient's autism, depression and initially maintained on Seroquel changed to Zyprexa. Therapies have been resumed patient with better participation with staff.   Past Medical History  Past Medical History  Diagnosis Date  . Autism   . Sickle cell trait ([place])   . Pneumonia 02/2015    Family History  family history includes Heart attack in his mother.  Prior Rehab/Hospitalizations:  Has the patient had major surgery during 100 days prior to admission? Yes   Admitted [facility] 02/28/15 until 03/10/15 d/c'd home Readmitted 03/11/15 to [facility] until d/c to [facility] 04/07/15 Admitted [facility] 05/06/15 from [facility] 05/06/15 until 05/16/15 when readmitted to [facility] due to respiratory issues [facility] 05/16/15 until 06/10/15 when readmitted to [facility] CIR.  Current Medications   Current facility-administered medications:  . acetaminophen (TYLENOL) solution 650 mg, 650 mg, Oral, Q6H PRN, Javier Glazier, MD, 650 mg at 06/08/15 1009 . acetaminophen (TYLENOL) suppository 650 mg, 650 mg, Rectal, Q6H PRN, Kara Mead V, MD, 650 mg at 06/04/15 2218 . antiseptic oral rinse solution (CORINZ), 7 mL, Mouth Rinse, QID, Rush Farmer, MD, 7 mL at 06/10/15 1200 . chlorhexidine gluconate (SAGE KIT) (PERIDEX) 0.12 % solution 15 mL, 15 mL, Mouth Rinse, BID, Rush Farmer, MD, 15 mL at 06/10/15 0800 . clonazePAM (KLONOPIN) tablet 0.5 mg, 0.5 mg, Oral, QHS, Cherene Altes, MD, 0.5 mg at 06/09/15 2316 . dextrose 5 % with KCl 20 mEq / L infusion, 20 mEq, Intravenous, Continuous, Cherene Altes, MD, Last Rate: 50 mL/hr at 06/10/15 0822, 20 mEq at 06/10/15 6301 . ferrous sulfate tablet 325 mg, 325 mg, Oral, BID WC, Javier Glazier, MD, 325 mg at 06/10/15 0800 . heparin injection 5,000 Units, 5,000 Units, Subcutaneous, Q8H, Collene Gobble, MD, 5,000 Units at 06/10/15 0542 . insulin aspart (novoLOG) injection 0-20 Units, 0-20 Units, Subcutaneous, TID WC, Javier Glazier, MD, 3 Units at 06/10/15 1200 . insulin aspart (novoLOG) injection 0-5 Units, 0-5 Units, Subcutaneous, QHS, Javier Glazier, MD, 0 Units at 06/04/15 2200 . ipratropium-albuterol (DUONEB) 0.5-2.5 (3) MG/3ML nebulizer solution 3 mL, 3 mL, Nebulization, Q2H PRN, Chesley Mires, MD . labetalol (NORMODYNE,TRANDATE) injection 10-20 mg, 10-20 mg, Intravenous, Q2H PRN, Kara Mead V, MD, 10 mg at 06/04/15 2219 . OLANZapine zydis (ZYPREXA) disintegrating tablet 5 mg, 5 mg, Oral, BID, Ambrose Finland, MD, 5 mg at 06/10/15 1000 . ondansetron (ZOFRAN) injection 4 mg, 4 mg, Intravenous, Q6H PRN, Collene Gobble, MD, 4 mg at 06/05/15 2148 . pantoprazole (PROTONIX) EC tablet 40 mg, 40 mg, Oral, Daily, Kendra P Hiatt, RPH, 40 mg at 06/10/15 1000 . phenol (CHLORASEPTIC) mouth spray 1 spray, 1 spray, Mouth/Throat, PRN, Javier Glazier, MD . polyethylene glycol (MIRALAX / GLYCOLAX) packet 17 g, 17 g, Per Tube, Daily PRN, Jake Church Masters, RPH . predniSONE (DELTASONE) tablet 40 mg, 40 mg, Oral, Q breakfast, Ripudeep K Rai, MD, 40 mg at 06/10/15 0800 . RESOURCE THICKENUP CLEAR, , Oral, PRN, Rush Farmer, MD . sodium chloride flush (NS) 0.9 % injection 10-40 mL, 10-40 mL, Intracatheter, PRN, Colbert Coyer, MD, 10 mL at 06/08/15 2336 . vitamin C (ASCORBIC ACID) tablet 250 mg, 250 mg, Oral, BID WC, Javier Glazier, MD, 250 mg at 06/10/15 0800 . white petrolatum (VASELINE) gel, , Topical, PRN, Kara Mead V, MD, 0.2 application at 60/10/93 1508  Patients Current Diet: DIET DYS 3 Room service appropriate?: Yes; Fluid consistency:: Thin  Precautions / Restrictions Precautions Precautions: Fall Precaution Comments: Watch HR and O2  sats. Restrictions Weight Bearing Restrictions: No   Has the patient had 2 or more falls or a fall with injury in  the past year?No  Prior Activity Level Pt was independent without AD pta 02/28/15. Attended workshop Monday through Friday. Lives with his sister, [name] who is his legal guardian. Loves to bowl, video games, etc.   [place] / [place] Devices/Equipment: None  Prior Device Use: Indicate devices/aids used by the patient prior to current illness, exacerbation or injury? None of the above  Prior Functional Level Prior Function Level of Independence: Independent (Prior to recent hospitalizations. ) Comments: (information taken from chart review) Loves bowling and video games  Self Care: Did the patient need help bathing, dressing, using the toilet or eating? Independent  Indoor Mobility: Did the patient need assistance with walking from room to room (with or without device)? Independent  Stairs: Did the patient need assistance with internal or external stairs (with or without device)? Independent  Functional Cognition: Did the patient need help planning regular tasks such as shopping or remembering to take medications? Needed some assistance.   Current Functional Level Cognition  Overall Cognitive Status: History of cognitive impairments - at baseline Orientation Level: Oriented to person, Oriented to place   Extremity Assessment (includes Sensation/Coordination)  Upper Extremity Assessment: Generalized weakness  Lower Extremity Assessment: Defer to PT evaluation    ADLs  Overall ADL's : Needs assistance/impaired Grooming: Wash/dry hands, Wash/dry face, Oral care, Sitting, Set up Upper Body Bathing: Maximal assistance, Sitting Lower Body Bathing: Maximal assistance, Sit to/from stand, Bed level Upper Body Dressing : Maximal assistance, Sitting Lower Body Dressing: Maximal assistance, Sit to/from stand Lower Body Dressing  Details (indicate cue type and reason): Pt able to assist with donning socks  Toilet Transfer: Minimal assistance, Stand-pivot (held IV pole) Toilet Transfer Details (indicate cue type and reason): simulated bed to chair Toileting- Clothing Manipulation and Hygiene: Total assistance, Bed level Functional mobility during ADLs: Moderate assistance, +2 for physical assistance General ADL Comments: Pt immediately willing to get OOB, cooperative with ADL and use of incentive spirometer.    Mobility  Overal bed mobility: Needs Assistance Bed Mobility: Supine to Sit Supine to sit: Supervision Sit to supine: Min assist, +2 for physical assistance General bed mobility comments: OOB in recliner    Transfers  Overall transfer level: Needs assistance Equipment used: 1 person hand held assist Transfers: Sit to/from Stand Sit to Stand: Min guard Stand pivot transfers: Min assist General transfer comment: repeat VC's to increase self particiaption.     Ambulation / Gait / Stairs / Wheelchair Mobility  Ambulation/Gait Ambulation/Gait assistance: Museum/gallery curator (Feet): 52 Feet Assistive device: Rolling walker (2 wheeled) Gait Pattern/deviations: Step-to pattern, Decreased step length - right, Decreased step length - left General Gait Details: remained on 2 lts due to sedsat with OT earlier. HR increased from 96 to 124 and sats on 2 lts avg 89%. VC's for deep breathing. pt taking shallow mouth breaths. Unsteady gait but tolerated increased distance.  Gait velocity: decreased    Posture / Balance Dynamic Sitting Balance Sitting balance - Comments: pt leans posteriorly, but at times did note pt was able to support himself.  Balance Overall balance assessment: Needs assistance Sitting-balance support: No upper extremity supported, Feet supported Sitting balance-Leahy Scale: Fair Sitting balance - Comments: pt leans posteriorly, but at times did note pt was able  to support himself.  Postural control: Posterior lean Standing balance support: Single extremity supported Standing balance-Leahy Scale: Poor Standing balance comment: Pt requires max assist to stand statically with 2 person assist and unable to achieve full upright  position.    Special needs/care consideration Oxygen at 2-3 liters nasal cannula IVF at 50 cc/hr Skin left ear stage II Bowel mgmt: continent LBM 5/27 Bladder mgmt: indwelling catheter Prefers male caregivers Mentally handicapped/needs direct simple instructions. Home oxygen equipment education needed and clarification of qualifying diagnosis for coverage with Medicare and Medicaid prior to d/c.    Previous Home Environment Living Arrangements: Other relatives (lives with his sister, [name]) Lives With: Family Available Help at Discharge: Family Type of Home: House Home Layout: One level Home Access: Stairs to enter Entrance Stairs-Rails: None Technical brewer of Steps: 3 Bathroom Shower/Tub: Public librarian, Architectural technologist: Programmer, systems: Yes How Accessible: Accessible via walker [place]: No Additional Comments: Obtained info via chart as no family present and pt unable to provide information due to baseline cognition  Discharge Living Setting Plans for Discharge Living Setting: Patient's home, Lives with (comment) (lives with his sister, [name]) Type of Home at Discharge: House Discharge Home Layout: One level Discharge Home Access: Stairs to enter Entrance Stairs-Rails: None Entrance Stairs-Number of Steps: 2-3 Discharge Bathroom Shower/Tub: Tub/shower unit, Curtain Discharge Bathroom Toilet: Standard Discharge Bathroom Accessibility: Yes How Accessible: Accessible via walker Does the patient have any problems obtaining your medications?: No  Social/Family/Support Systems Patient Roles: Other (Comment) Contact Information: [name], sister and legal  guardian Anticipated Caregiver: sister and other family/friends Anticipated Caregiver's Contact Information: [name], sister and legal guardian Ability/Limitations of Caregiver: [name] now works 8 a until 4 pm daiy as Human resources officer. She has taken medical withdrawal form school due to pt's medical issues Caregiver Availability: 24/7 Discharge Plan Discussed with Primary Caregiver: Yes Is Caregiver In Agreement with Plan?: Yes Does Caregiver/Family have Issues with Lodging/Transportation while Pt is in Rehab?: No [name] plans to check into PCS aide to assist as well as family/friends to provide 24/7 supervision initially at d/c home.   She is aware that pt likely to d/c home with oxygen. Sister states when pt initially d/c'd home 03/10/15, [place] delivered two portable O2 tanks and a concentrator. The representative stated to her that pt did not have a qualifying diagnosis for the oxygen, therefore she had a monthly copay. There also was a discrepancy of tanks delivered and concentrator during that less than 24 hrs home before readmitted due to desaturations and respiratory issues. Home oxygen equipment and education of use needed prior to d/c home.  Goals/Additional Needs Patient/Family Goal for Rehab: supervision with PT, OT, and SLP Expected length of stay: [facility]OS 7- 10 days Special Service Needs: Patient mentally handicapped and autistic. Limited vocabulary of about 500 words per family. needs simple explanations and timing. prefers male caregivers  Additional Information: Pt autistic and mentally handicapped. Devleopment age of 48 or 8 year old per Aunt [name] Pt/Family Agrees to Admission and willing to participate: Yes Program Orientation Provided & Reviewed with Pt/Caregiver Including Roles & Responsibilities: Yes  Decrease burden of Care through IP rehab admission: n/a  Possible need for SNF placement upon discharge:ot anticipated. I spoke with [name] on 06/09/15 and  06/10/15 reviewed goals for a readmission to inpt rehab. Goals would be supervision level and that she and family/friends would need to coordinate that coverage. I also discussed that pt would likely go home with home oxygen.  Patient Condition: The patient's medical and functional level have been reviewed by myself, the Physician Assistant and the Rehabilitation physician. Patient's condition is appropriate for intensive rehabilitative care in an inpatient rehabilitation facility. See history of present  illness for medical update. Patient is functionally min to mod assist overall. Patient is appropirate for admission today.  Preadmission Screen Completed By: Cleatrice Burke, 06/10/2015 3:03 PM ______________________________________________________________________  Discussed status with Dr. Naaman Plummer on 06/10/2015 at 1501 and received telephone approval for admission today.  Admission Coordinator: Cleatrice Burke, time 5883 Date 06/10/2015.          Cosigned by: Meredith Staggers, MD at 06/10/2015 3:30 PM  Revision History     Date/Time User Provider Type Action   06/10/2015 3:30 PM Meredith Staggers, MD Physician Cosign   06/10/2015 3:03 PM Cristina Gong, RN Rehab Admission Coordinator Sign

## 2015-06-10 NOTE — Progress Notes (Signed)
Speech Language Pathology Treatment: Dysphagia  Patient Details Name: Juan Cameron MRN: [id number] DOB: 1975-09-14 Today's Date: 06/10/2015 Time: 0815-0827 SLP Time Calculation (min) (ACUTE ONLY): 12 min  Assessment / Plan / Recommendation Clinical Impression  Pt consumed 100% of breakfast prior to SLP arrival. Suspect impulsive intake as is his baseline. He was side lying, SLP verbally cues pt to sit upright, pt demonstrated using bed controls to reposition independently. Tolerated thin liquids without difficulty and no delay. Swallow is WNL. Any risk is from method of intake. Given impulsive intake with solids, will keep pt on a dys 3 diet to reduce risk of swallowing partially masticated whole foods. No further acute SLP needs at this time. Will sign off.    HPI HPI: Pt is a 40 y.o. male with PMH of autism and ADHD , sickle cell trait, independent prior to admission. Pt was admitted for PNA and discharged 03/10/15 readmitted 04/07/15 with respiratory failure requiring intubation, tracheostomy 03/20/2015; failed [facility] Pt  decannulated 04/25/2015, diet slowly advanced to a regular consistency, bouts of tachycardia atrial fibrillation converted back to normal sinus rhythm and transferred to CIR.  SLP evaluated 5/4 and was being followed for dysphagia due to deconditioning and cognitive impairment but was on regular solids/ thin liquids. Transferred back to acute venue with desaturation in the 70's. BSE completed 5/7 recommending regular diet/thin liquids; continued with respiratory insufficiency and intubated 5/9-5/20 and reorders to assess swallow function.       SLP Plan  Continue with current plan of care     Recommendations  Diet recommendations: Thin liquid;Dysphagia 3 (mechanical soft) Liquids provided via: Cup;Straw Medication Administration: Whole meds with puree Supervision: Patient able to self feed;Full supervision/cueing for  compensatory strategies Compensations: Minimize environmental distractions;Slow rate;Small sips/bites Postural Changes and/or Swallow Maneuvers: Seated upright 90 degrees;Upright 30-60 min after meal             Plan: Continue with current plan of care     GO               [name], MA CCC-SLP [phone]  [name], [name] 06/10/2015, 9:27 AM

## 2015-06-10 NOTE — H&P (View-Only) (Signed)
Physical Medicine and Rehabilitation Admission H&P    Chief complaint: Cough   HPI: 40 year old right handed African-American male with past medical history significant for autism with element of attention deficit hyperactivity disorder as well as sickle cell trait. Patient lives with his sister and attends adult daycare workshop daily. Independent prior to admission. Patient originally admitted to [facility] for multilobar pneumonia and discharged to home on 03/10/2015 and still using 4 L of oxygen. He was admitted back to the hospital 04/07/2015 with respiratory failure and ARDS. Patient did require intubation placed on broad-spectrum antibiotics. His hospital stay was complicated by acute kidney injury with dialysis initiated that he did receive a short time and creatinine has now returned back to normal levels at 1.11. Required tracheostomy 03/20/2015 and after failed extubation [facility] 04/07/2015. Placed on Provigil for mood stabilization suspect encephalopathy with noted history of autism with good results. He was ultimately decannulated 04/25/2015. His diet was slowly advanced to a regular consistency bouts of tachycardia atrial fibrillation converted back to normal sinus rhythm maintained on Cardizem as well as Lopressor. Vancomycin-resistant enterococci urinary tract infection treated and remains on contact precautions. Patient still with generalized weakness therapies initiated. Patient was admitted for a comprehensive rehabilitation program 05/06/2015. He was ambulating up to 10 feet without assistive device. Needed encouragement to participate in relation to his autism. On 05/16/2015 periodic bouts of hypoxia into the high 70s and low 80s and pulmonary services continue to follow. Chest x-ray showed persistent mild interstitial prominence suspicious for mild interstitial edema. Bilateral basilar atelectasis versus infiltrate.  Patient had been placed on antibiotic care. On the morning of 05/16/2015 in need of nonrebreather mask for decreasing saturations. Due to hypoxic event patient was discharged to acute care services for ongoing evaluation and monitoring. Necessitated need for ventilator support for a short time. Responded well to intravenous Solu-Medrol. Patient's acute respiratory failure felt to be due to MRSA pneumonia pulmonary edema. Maintained on contact precautions. Diet slowly advanced to mechanical soft thin liquids. Subcutaneous heparin for DVT prophylaxis. Psychiatry was consulted 06/05/2015 per patient's autism, depression and initially maintained on Seroquel changed to Zyprexa. Therapies have been resumed patient with better participation with staff. Patient was admitted to inpatient rehabilitation services in regards to debilitation related to hypoxia for a comprehensive rehabilitation program.  ROS Constitutional: Negative for fever and chills.  HENT: Negative for hearing loss.  Eyes: Negative for blurred vision and double vision.  Respiratory: Positive for cough and shortness of breath.  Cardiovascular: Positive for leg swelling. Negative for chest pain and palpitations.  Gastrointestinal: Positive for constipation. Negative for nausea and vomiting.  Genitourinary: Negative for dysuria and hematuria.  Skin: Negative for rash.  Neurological: Positive for weakness. Negative for seizures, loss of consciousness and headaches.  Psychiatric/Behavioral:   Severe autism with element of attention deficit hyperactivity disorder  All other systems reviewed and are negative   Past Medical History  Diagnosis Date  . Autism   . Sickle cell trait ([place])   . Pneumonia 02/2015   Past Surgical History  Procedure Laterality Date  . No past surgeries     Family History  Problem Relation Age of Onset  . Heart attack Mother    Social History:  reports that he has never smoked. He has never used smokeless  tobacco. He reports that he does not drink alcohol or use illicit drugs. Allergies:  Allergies  Allergen Reactions  . Peanuts [Peanut  Oil] Anaphylaxis   Medications Prior to Admission  Medication Sig Dispense Refill  . acetaminophen (TYLENOL) 160 MG/5ML solution Place 20.3 mLs (650 mg total) into feeding tube every 4 (four) hours as needed for mild pain, headache or fever. 120 mL 0  . albuterol (PROVENTIL) (2.5 MG/3ML) 0.083% nebulizer solution Take 2.5 mg by nebulization every 6 (six) hours as needed for wheezing or shortness of breath.    . Amino Acids-Protein Hydrolys (FEEDING SUPPLEMENT, PRO-STAT SUGAR FREE 64,) LIQD Place 30 mLs into feeding tube 2 (two) times daily. 900 mL 0  . chlorhexidine gluconate, SAGE KIT, (PERIDEX) 0.12 % solution 15 mLs by Mouth Rinse route 2 (two) times daily. 120 mL 0  . clonazePAM (KLONOPIN) 1 MG tablet Take 1 tablet (1 mg total) by mouth 2 (two) times daily. 30 tablet 0  . docusate (COLACE) 50 MG/5ML liquid Place 10 mLs (100 mg total) into feeding tube daily. 100 mL 0  . fentaNYL (SUBLIMAZE) 100 MCG/2ML injection Inject 0.5-1 mLs (25-50 mcg total) into the vein every 2 (two) hours as needed for severe pain. 2 mL 0  . heparin 5000 UNIT/ML injection Inject 1 mL (5,000 Units total) into the skin every 8 (eight) hours. 1 mL   . insulin aspart (NOVOLOG) 100 UNIT/ML injection Inject 2-6 Units into the skin every 4 (four) hours. 10 mL 11  . ipratropium-albuterol (DUONEB) 0.5-2.5 (3) MG/3ML SOLN Take 3 mLs by nebulization 3 (three) times daily. 360 mL 3  . meropenem 500 mg in sodium chloride 0.9 % 50 mL Inject 500 mg into the vein daily.    . Nutritional Supplements (FEEDING SUPPLEMENT, VITAL HIGH PROTEIN,) LIQD liquid Place 1,000 mLs into feeding tube daily.    . pantoprazole sodium (PROTONIX) 40 mg/20 mL PACK Place 20 mLs (40 mg total) into feeding tube daily at 12 noon. 30 each   . polyethylene glycol (MIRALAX / GLYCOLAX) packet Take 17 g by mouth daily as needed  for moderate constipation. 14 each 0    Home: Home Living Family/patient expects to be discharged to:: Inpatient rehab Living Arrangements: Other relatives Available Help at Discharge: Family Type of Home: House Home Access: Stairs to enter Technical brewer of Steps: 3 Entrance Stairs-Rails: None Home Layout: One level Bathroom Shower/Tub: Tub/shower unit, Industrial/product designer: Yes Additional Comments: Obtained info via chart as no family present and pt unable to provide information due to baseline cognition  Lives With: Family   Functional History: Prior Function Level of Independence: Independent (Prior to  recent hospitalizations.  ) Comments: (information taken from chart review) Loves bowling and video games  Functional Status:  Mobility: Bed Mobility Overal bed mobility: Needs Assistance, +2 for physical assistance Bed Mobility: Supine to Sit Supine to sit: Min assist, HOB elevated Sit to supine: Min assist, +2 for physical assistance General bed mobility comments: assist to elevate trunk into sitting; increased time and cues for technique Transfers Overall transfer level: Needs assistance Equipment used: 2 person hand held assist Transfers: Sit to/from Stand Sit to Stand: +2 physical assistance, Min assist Stand pivot transfers: Max assist, +2 physical assistance General transfer comment: X 2 from EOB with assist upon standing for balance; pt used momentum to power up into standing; cues for hand placement and technique Ambulation/Gait Ambulation/Gait assistance: Min assist, +2 physical assistance Ambulation Distance (Feet): 20 Feet (5, 15) Assistive device: 2 person hand held assist Gait Pattern/deviations: Step-to pattern, Decreased step length - right, Decreased step length - left General Gait  Details: very short steps with HHA +2 for balance with no LOB; max encouragement to ambulate and family present helped for  encouragment; HR elevated to 130 and SpO2 decreased to 86% with ambulation but returned to WNL in ~1 minute with seated rest; educated pt on pursed lip breathing technique     ADL: ADL Overall ADL's : Needs assistance/impaired Grooming: Wash/dry face, Wash/dry hands, Sitting, Moderate assistance Upper Body Bathing: Maximal assistance, Sitting Lower Body Bathing: Maximal assistance, Sit to/from stand, Bed level Upper Body Dressing : Maximal assistance, Sitting Lower Body Dressing: Maximal assistance, Sit to/from stand Lower Body Dressing Details (indicate cue type and reason): Pt able to assist with donning socks  Toilet Transfer: Total assistance Toilet Transfer Details (indicate cue type and reason): Pt unable to tolerate standing due to abd pain  Toileting- Clothing Manipulation and Hygiene: Total assistance, Bed level Functional mobility during ADLs: Moderate assistance, +2 for physical assistance General ADL Comments: Pt very engaging, but limited by abdominal pain   Cognition: Cognition Overall Cognitive Status: History of cognitive impairments - at baseline Orientation Level: Oriented to person, Oriented to place Cognition Arousal/Alertness: Awake/alert Behavior During Therapy: WFL for tasks assessed/performed Overall Cognitive Status: History of cognitive impairments - at baseline  Physical Exam: Blood pressure 144/86, pulse 103, temperature 98.4 F (36.9 C), temperature source Oral, resp. rate 17, height 5' 9"  (1.753 m), weight 84.1 kg (185 lb 6.5 oz), SpO2 97 %. Physical Exam  Gen: pleasant, sitting in chair watching TV HENT: trach site healed Head: Normocephalic.  Eyes: Conjunctivae and EOM are normal. Pupils are equal, round, and reactive to light.  Neck: Normal range of motion. Neck supple. No tracheal deviation present. No thyromegaly present.  Cardiovascular: Normal rate and regular rhythm.  Respiratory: Effort normal. No respiratory distress.  GI: Soft. Bowel  sounds are normal. He exhibits no distension Neurological. Patient was alert. He made limited eye contact. Followed simple commands. He would use simple to 3 word phrases to communicate. He could not recall being on on the rehabilitation unit before. Stated that he wanted to go home.. UE strength grossly 4/5 bilaterally prox to diatl. Marland Kitchen Hesitated to move either leg due to bilateral knee pain---strength grossly 4/5 otherwise. Sensation appears to be intact in all 4.  Skin. Intact, no breakdown Psych: pt very flat, distracted   Results for orders placed or performed during the hospital encounter of 05/16/15 (from the past 48 hour(s))  Glucose, capillary     Status: None   Collection Time: 06/08/15  9:56 AM  Result Value Ref Range   Glucose-Capillary 88 65 - 99 mg/dL  Glucose, capillary     Status: Abnormal   Collection Time: 06/08/15 11:32 AM  Result Value Ref Range   Glucose-Capillary 115 (H) 65 - 99 mg/dL  Glucose, capillary     Status: Abnormal   Collection Time: 06/08/15  4:31 PM  Result Value Ref Range   Glucose-Capillary 135 (H) 65 - 99 mg/dL  Glucose, capillary     Status: None   Collection Time: 06/08/15 10:19 PM  Result Value Ref Range   Glucose-Capillary 92 65 - 99 mg/dL   Comment 1 Notify RN    Comment 2 Document in Chart   Glucose, capillary     Status: None   Collection Time: 06/09/15  8:08 AM  Result Value Ref Range   Glucose-Capillary 83 65 - 99 mg/dL  Glucose, capillary     Status: Abnormal   Collection Time: 06/09/15 12:24 PM  Result  Value Ref Range   Glucose-Capillary 102 (H) 65 - 99 mg/dL  CBC     Status: Abnormal   Collection Time: 06/09/15  2:45 PM  Result Value Ref Range   WBC 18.3 (H) 4.0 - 10.5 K/uL   RBC 3.79 (L) 4.22 - 5.81 MIL/uL   Hemoglobin 10.0 (L) 13.0 - 17.0 g/dL   HCT 31.4 (L) 39.0 - 52.0 %   MCV 82.8 78.0 - 100.0 fL   MCH 26.4 26.0 - 34.0 pg   MCHC 31.8 30.0 - 36.0 g/dL   RDW 16.9 (H) 11.5 - 15.5 %   Platelets 254 150 - 400 K/uL  Basic  metabolic panel     Status: Abnormal   Collection Time: 06/09/15  2:45 PM  Result Value Ref Range   Sodium 141 135 - 145 mmol/L   Potassium 3.3 (L) 3.5 - 5.1 mmol/L   Chloride 108 101 - 111 mmol/L   CO2 26 22 - 32 mmol/L   Glucose, Bld 176 (H) 65 - 99 mg/dL   BUN 11 6 - 20 mg/dL   Creatinine, Ser 0.83 0.61 - 1.24 mg/dL   Calcium 8.0 (L) 8.9 - 10.3 mg/dL   GFR calc non Af Amer >60 >60 mL/min   GFR calc Af Amer >60 >60 mL/min    Comment: (NOTE) The eGFR has been calculated using the CKD EPI equation. This calculation has not been validated in all clinical situations. eGFR's persistently <60 mL/min signify possible Chronic Kidney Disease.    Anion gap 7 5 - 15  Glucose, capillary     Status: Abnormal   Collection Time: 06/09/15  5:19 PM  Result Value Ref Range   Glucose-Capillary 125 (H) 65 - 99 mg/dL  Glucose, capillary     Status: Abnormal   Collection Time: 06/09/15 11:20 PM  Result Value Ref Range   Glucose-Capillary 112 (H) 65 - 99 mg/dL  Basic metabolic panel     Status: Abnormal   Collection Time: 06/10/15  4:55 AM  Result Value Ref Range   Sodium 139 135 - 145 mmol/L   Potassium 3.3 (L) 3.5 - 5.1 mmol/L   Chloride 102 101 - 111 mmol/L   CO2 28 22 - 32 mmol/L   Glucose, Bld 103 (H) 65 - 99 mg/dL   BUN 13 6 - 20 mg/dL   Creatinine, Ser 0.78 0.61 - 1.24 mg/dL   Calcium 8.8 (L) 8.9 - 10.3 mg/dL   GFR calc non Af Amer >60 >60 mL/min   GFR calc Af Amer >60 >60 mL/min    Comment: (NOTE) The eGFR has been calculated using the CKD EPI equation. This calculation has not been validated in all clinical situations. eGFR's persistently <60 mL/min signify possible Chronic Kidney Disease.    Anion gap 9 5 - 15  Glucose, capillary     Status: None   Collection Time: 06/10/15  7:57 AM  Result Value Ref Range   Glucose-Capillary 79 65 - 99 mg/dL   No results found.     Medical Problem List and Plan: 1.  Debilitation secondary to respiratory failure/MRSA pneumonia. Contact  precautions. All antibiotic therapy is completed. Taper of by mouth prednisone over 2 weeks.  -begin inpatient rehab 2.  DVT Prophylaxis/Anticoagulation: Subcutaneous heparin. Monitor for any bleeding episodes 3. Pain Management: Tylenol as needed 4. Mood/severe autism: Klonopin 0.5 mg daily at bedtime, Zyprexa 5 mg twice a day. Patient appears to be at his baseline per family 5. Neuropsych: This patient is not capable  of making decisions on his own behalf. 6. Skin/Wound Care: Routine skin checks 7. Fluids/Electrolytes/Nutrition: Routine I&O's with follow-up chemistries 8. Dysphagia. Diet advance to mechanical soft thin liquids. Monitor for any signs of aspiration 9. Acute on chronic anemia. Continue iron supplement. Follow-up CBC 10. History of sickle cell trait.   Post Admission Physician Evaluation: 1. Functional deficits secondary  to debility after multiple medical. 2. Patient is admitted to receive collaborative, interdisciplinary care between the physiatrist, rehab nursing staff, and therapy team. 3. Patient's level of medical complexity and substantial therapy needs in context of that medical necessity cannot be provided at a lesser intensity of care such as a SNF. 4. Patient has experienced substantial functional loss from his/her baseline which was documented above under the "Functional History" and "Functional Status" headings.  Judging by the patient's diagnosis, physical exam, and functional history, the patient has potential for functional progress which will result in measurable gains while on inpatient rehab.  These gains will be of substantial and practical use upon discharge  in facilitating mobility and self-care at the household level. 5. Physiatrist will provide 24 hour management of medical needs as well as oversight of the therapy plan/treatment and provide guidance as appropriate regarding the interaction of the two. 6. 24 hour rehab nursing will assist with bladder  management, bowel management, safety, skin/wound care, disease management, medication administration, pain management and patient education  and help integrate therapy concepts, techniques,education, etc. 7. PT will assess and treat for/with: Lower extremity strength, range of motion, stamina, balance, functional mobility, safety, adaptive techniques and equipment, pain mgt, cognitive behavioral rx.   Goals are: supervision. 8. OT will assess and treat for/with: ADL's, functional mobility, safety, upper extremity strength, adaptive techniques and equipment, ego support, education, cognitive-behavioral rx.   Goals are: supervision. Therapy may proceed with showering this patient. 9. SLP will assess and treat for/with: n/a.  Goals are: n/a. 10. Case Management and Social Worker will assess and treat for psychological issues and discharge planning. 11. Team conference will be held weekly to assess progress toward goals and to determine barriers to discharge. 12. Patient will receive at least 3 hours of therapy per day at least 5 days per week. 13. ELOS: 7 days.  He wil go home with sister.   14. Prognosis:  excellent     Meredith Staggers, MD, [facility] 06/10/2015   06/10/2015

## 2015-06-10 NOTE — Progress Notes (Signed)
Nutrition Follow-up  DOCUMENTATION CODES:   Not applicable  INTERVENTION:   -Continue with dysphagia 3 diet with thin liquids -Encourage adequate PO intake  NUTRITION DIAGNOSIS:   Inadequate oral intake related to inability to eat as evidenced by NPO status.  Resolved  GOAL:   Patient will meet greater than or equal to 90% of their needs  Met  MONITOR:   PO intake, Supplement acceptance, Labs, Weight trends, I & O's  REASON FOR ASSESSMENT:   Consult Enteral/tube feeding initiation and management  ASSESSMENT:   40 y/o M with PMH of sickle cell trait, PNA and autism with recent prolonged hospitalization for multilobar PNA with ARDS, AKI requiring HD (resolved, off HD), prolonged respiratory failure s/p tracheostomy. He was discharged to [facility] on 3/27 for further ventilator weaning. At [facility], he was He subsequently was decannulated and transferred to [facility] on 4/25.Am of 5/5 2017 pt developed progressive dry cough with acute desaturation into the 70's on 4L min [place], transferred to SDU.   Pt underwent MBSS on 06/03/15 and was placed on a dysphagia 3 diet with nectar thick liquids. SLP re-evaluated on 06/09/15 and was upgraded to a dysphagia 3 diet with thin liquids. Intake has improved since initiation of current diet; noted that pt with periods of poor PO intake when PO's were first initiated as pt was spitting out food. Meal completion now 75-100%. Per SLP, suspect pt is very impulsive when eating.   Per discussion with charge nurse, plan is for pt to likely transfer to CIR later on today.   Labs reviewed: K: 3.3, CBGS: 79-125.   Diet Order:  DIET DYS 3 Room service appropriate?: Yes; Fluid consistency:: Thin  Skin:  Reviewed, no issues  Last BM:  06/07/15  Height:   Ht Readings from Last 1 Encounters:  05/20/15 5' 9"  (1.753 m)    Weight:   Wt Readings from Last 1 Encounters:  06/10/15 185 lb 6.5 oz (84.1 kg)    Ideal Body Weight:  72.7 kg  BMI:   Body mass index is 27.37 kg/(m^2).  Estimated Nutritional Needs:   Kcal:  2100-2300  Protein:  110-125 grams  Fluid:  2.1-2.3 L  EDUCATION NEEDS:   No education needs identified at this time  Sasha Rogel A. Jimmye Norman, RD, LDN, CDE Pager: [phone] After hours Pager: [phone]

## 2015-06-10 NOTE — Progress Notes (Signed)
I have Rehab MD approval to admit pt ot inpt rehab today. I have contacted Dr. Tana Coast, RNCM. amd SW. I met with pt's sister, [name], and she is in agreement. She is aware that pt will need 24/7 supervision at d/c . I will make the arrangements to admit today. [phone]

## 2015-06-11 ENCOUNTER — Inpatient Hospital Stay (HOSPITAL_COMMUNITY): Payer: Medicare Other | Admitting: Speech Pathology

## 2015-06-11 ENCOUNTER — Inpatient Hospital Stay (HOSPITAL_COMMUNITY): Payer: Medicare Other | Admitting: Physical Therapy

## 2015-06-11 ENCOUNTER — Inpatient Hospital Stay (HOSPITAL_COMMUNITY): Payer: Medicare Other | Admitting: Occupational Therapy

## 2015-06-11 DIAGNOSIS — D72829 Elevated white blood cell count, unspecified: Secondary | ICD-10-CM

## 2015-06-11 DIAGNOSIS — D638 Anemia in other chronic diseases classified elsewhere: Secondary | ICD-10-CM | POA: Insufficient documentation

## 2015-06-11 DIAGNOSIS — D62 Acute posthemorrhagic anemia: Secondary | ICD-10-CM | POA: Insufficient documentation

## 2015-06-11 DIAGNOSIS — E876 Hypokalemia: Secondary | ICD-10-CM

## 2015-06-11 LAB — CBC WITH DIFFERENTIAL/PLATELET
BASOS PCT: 0 %
Basophils Absolute: 0.1 10*3/uL (ref 0.0–0.1)
EOS ABS: 0.4 10*3/uL (ref 0.0–0.7)
EOS PCT: 2 %
HCT: 31 % — ABNORMAL LOW (ref 39.0–52.0)
HEMOGLOBIN: 9.8 g/dL — AB (ref 13.0–17.0)
LYMPHS ABS: 4.4 10*3/uL — AB (ref 0.7–4.0)
Lymphocytes Relative: 21 %
MCH: 26.6 pg (ref 26.0–34.0)
MCHC: 31.6 g/dL (ref 30.0–36.0)
MCV: 84.2 fL (ref 78.0–100.0)
MONO ABS: 1.5 10*3/uL — AB (ref 0.1–1.0)
MONOS PCT: 8 %
NEUTROS PCT: 69 %
Neutro Abs: 14.2 10*3/uL — ABNORMAL HIGH (ref 1.7–7.7)
PLATELETS: 278 10*3/uL (ref 150–400)
RBC: 3.68 MIL/uL — ABNORMAL LOW (ref 4.22–5.81)
RDW: 17.3 % — AB (ref 11.5–15.5)
WBC: 20.7 10*3/uL — ABNORMAL HIGH (ref 4.0–10.5)

## 2015-06-11 LAB — COMPREHENSIVE METABOLIC PANEL
ALBUMIN: 3 g/dL — AB (ref 3.5–5.0)
ALK PHOS: 42 U/L (ref 38–126)
ALT: 48 U/L (ref 17–63)
ANION GAP: 8 (ref 5–15)
AST: 31 U/L (ref 15–41)
BUN: 15 mg/dL (ref 6–20)
CALCIUM: 9.1 mg/dL (ref 8.9–10.3)
CO2: 28 mmol/L (ref 22–32)
Chloride: 106 mmol/L (ref 101–111)
Creatinine, Ser: 0.87 mg/dL (ref 0.61–1.24)
GFR calc non Af Amer: >60 mL/min (ref >60)
GLUCOSE: 105 mg/dL — AB (ref 65–99)
POTASSIUM: 3.9 mmol/L (ref 3.5–5.1)
SODIUM: 142 mmol/L (ref 135–145)
Total Bilirubin: 0.2 mg/dL — ABNORMAL LOW (ref 0.3–1.2)
Total Protein: 6.2 g/dL — ABNORMAL LOW (ref 6.5–8.1)

## 2015-06-11 MED ORDER — SODIUM CHLORIDE 0.9% FLUSH
10.0000 mL | INTRAVENOUS | Status: DC | PRN
  Administered 2015-06-11: 30 mL
  Filled 2015-06-11: qty 40

## 2015-06-11 NOTE — Care Management Note (Signed)
Inpatient Rehabilitation Center Individual Statement of Services  Patient Name:  Juan RipperRobert Haapala  Date:  06/11/2015  Welcome to the Inpatient Rehabilitation Center.  Our goal is to provide you with an individualized program based on your diagnosis and situation, designed to meet your specific needs.  With this comprehensive rehabilitation program, you will be expected to participate in at least 3 hours of rehabilitation therapies Monday-Friday, with modified therapy programming on the weekends.  Your rehabilitation program will include the following services:  Physical Therapy (PT), Occupational Therapy (OT), Speech Therapy (ST), 24 hour per day rehabilitation nursing, Therapeutic Recreaction (TR), Case Management (Social Worker), Rehabilitation Medicine, Nutrition Services and Pharmacy Services  Weekly team conferences will be held on Wednesday to discuss your progress.  Your Social Worker will talk with you frequently to get your input and to update you on team discussions.  Team conferences with you and your family in attendance may also be held.  Expected length of stay: 10-14 days Overall anticipated outcome: supervision level  Depending on your progress and recovery, your program may change. Your Social Worker will coordinate services and will keep you informed of any changes. Your Social Worker's name and contact numbers are listed  below.  The following services may also be recommended but are not provided by the Inpatient Rehabilitation Center:    Home Health Rehabiltiation Services  Outpatient Rehabilitation Services    Arrangements will be made to provide these services after discharge if needed.  Arrangements include referral to agencies that provide these services.  Your insurance has been verified to be:  Medicare & Medicaid Your primary doctor is:  [name]  Pertinent information will be shared with your doctor and your insurance company.  Social Worker:  Dossie DerBecky Dannilynn Gallina,  SW [phone] or ([phone]) [phone]  Information discussed with and copy given to patient by: Lucy Chrisupree, Kadence Mimbs G, 06/11/2015, 2:46 PM

## 2015-06-11 NOTE — Plan of Care (Signed)
Problem: RH BLADDER ELIMINATION Goal: RH STG MANAGE BLADDER WITH ASSISTANCE STG Manage Bladder With mod Assistance  Outcome: Not Progressing Total assist-catheter    Goal: RH STG MANAGE BLADDER WITH EQUIPMENT WITH ASSISTANCE STG Manage Bladder With Equipment With Mod Assistance  Outcome: Not Progressing Total assist

## 2015-06-11 NOTE — Evaluation (Signed)
Occupational Therapy Assessment and Plan  Patient Details  Name: Juan Cameron MRN: [id number] Date of Birth: [birth year]  OT Diagnosis: cognitive deficits and muscle weakness (generalized) Rehab Potential: Rehab Potential (ACUTE ONLY): Good ELOS: 10-14 days   Today's Date: 06/11/2015 OT Individual Time: 1101-1200 OT Individual Time Calculation (min): 59 min     Problem List:  Patient Active Problem List   Diagnosis Date Noted  . Acute blood loss anemia   . Anemia of chronic disease   . Respiratory failure ([place]) 06/10/2015  . Acute on chronic respiratory failure with hypoxia ([place])   . Chronic diastolic CHF (congestive heart failure) ([place])   . Sinus tachycardia ([place])   . Acute renal failure ([place])   . Sickle cell trait ([place])   . Essential hypertension   . SOB (shortness of breath)   . Pneumonia   . Acute pulmonary edema ([facility])   . Hypoxemia   . Sepsis ([place])   . Cough   . Right knee pain   . Cognitive deficits   . Tachycardia   . Debility 05/06/2015  . Encephalopathy 05/06/2015  . 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 Ventilator dependence ([place]) 04/08/2015  . Pressure ulcer 04/05/2015  . Acute respiratory failure ([place])   . Fever 03/21/2015  . Acute renal insufficiency 03/21/2015  . Obesity 03/21/2015  . ARDS (adult respiratory distress syndrome) ([place])   . HCAP (healthcare-associated pneumonia) 03/11/2015  . Leukocytosis 03/11/2015  . Acute hyperglycemia 03/11/2015  . Left ventricular diastolic dysfunction, NYHA class 2 03/11/2015  . Thrombocytopenia ([place]) 03/11/2015  . Acute lung injury 03/10/2015  . Acute respiratory failure with hypoxia ([place]) 02/28/2015  . CAP (community acquired pneumonia) 02/28/2015  . Hypokalemia 02/28/2015  . Autism 02/28/2015    Past Medical History:  Past Medical History  Diagnosis Date  . Autism   . Sickle cell trait ([place])   . Pneumonia 02/2015   Past Surgical History:  Past Surgical History  Procedure Laterality Date  . No past  surgeries      Assessment & Plan Clinical Impression: Patient is a 40 y.o. year old male with past medical history significant for autism with element of attention deficit hyperactivity disorder as well as sickle cell trait. Patient lives with his sister and attends adult daycare workshop daily. Independent prior to admission. Patient originally admitted to [facility] for multilobar pneumonia and discharged to home on 03/10/2015 and still using 4 L of oxygen. He was admitted back to the hospital 04/07/2015 with respiratory failure and ARDS. Patient did require intubation placed on broad-spectrum antibiotics. His hospital stay was complicated by acute kidney injury with dialysis initiated that he did receive a short time and creatinine has now returned back to normal levels at 1.11. Required tracheostomy 03/20/2015 and after failed extubation [facility]. Placed on Provigil for mood stabilization suspect encephalopathy with noted history of autism with good results. He was ultimately decannulated 04/25/2015. His diet was slowly advanced to a regular consistency bouts of tachycardia atrial fibrillation converted back to normal sinus rhythm maintained on Cardizem as well as Lopressor. Vancomycin-resistant enterococci urinary tract infection treated and remains on contact precautions. Patient still with generalized weakness therapies initiated. Patient was admitted for a comprehensive rehabilitation program 05/06/2015. He was ambulating up to 10 feet without assistive device. Needed encouragement to participate in relation to his autism. On 05/16/2015 periodic bouts of hypoxia into the high 70s and low 80s and pulmonary services continue to  follow. Chest x-ray showed persistent mild interstitial prominence suspicious for mild interstitial edema. Bilateral basilar atelectasis versus infiltrate. Patient had been placed on antibiotic care. On the  morning of 05/16/2015 in need of nonrebreather mask for decreasing saturations. Due to hypoxic event patient was discharged to acute care services for ongoing evaluation and monitoring. Necessitated need for ventilator support for a short time. Responded well to intravenous Solu-Medrol. Patient's acute respiratory failure felt to be due to MRSA pneumonia pulmonary edema. Maintained on contact precautions. Diet slowly advanced to mechanical soft thin liquids. Subcutaneous heparin for DVT prophylaxis. Psychiatry was consulted 06/05/2015 per patient's autism, depression and initially maintained on Seroquel changed to Zyprexa. Therapies have been resumed patient with better participation with staff. Patient was admitted to inpatient rehabilitation services in regards to debilitation related to hypoxia for a comprehensive rehabilitation program. .  Patient transferred to CIR on 06/10/2015 .    Patient currently requires min with basic self-care skills secondary to muscle weakness, decreased cardiorespiratoy endurance and decreased oxygen support and decreased standing balance, decreased postural control and decreased balance strategies.  Prior to hospitalization, patient could complete ADls  with modified independent .  Patient will benefit from skilled intervention to increase independence with basic self-care skills prior to discharge home with care partner.  Anticipate patient will require 24 hour supervision and follow up home health.  OT - End of Session Activity Tolerance: Tolerates 10 - 20 min activity with multiple rests Endurance Deficit: Yes Endurance Deficit Description: on supplemental oxygen, multiple rest breaks for fatigue7 OT Assessment Rehab Potential (ACUTE ONLY): Good OT Patient demonstrates impairments in the following area(s): Balance;Endurance;Motor;Pain;Safety OT Basic ADL's Functional Problem(s): Grooming;Bathing;Dressing;Toileting OT Advanced ADL's Functional Problem(s): Other  (comment) (n/a) OT Transfers Functional Problem(s): Toilet;Tub/Shower OT Additional Impairment(s): None OT Plan OT Intensity: Minimum of 1-2 x/day, 45 to 90 minutes OT Frequency: 5 out of 7 days OT Duration/Estimated Length of Stay: 10-14 days OT Treatment/Interventions: Balance/vestibular training;Discharge planning;Community reintegration;DME/adaptive equipment instruction;Functional mobility training;Patient/family education;Self Care/advanced ADL retraining;Therapeutic Activities;Therapeutic Exercise;UE/LE Strength taining/ROM;UE/LE Coordination activities OT Self Feeding Anticipated Outcome(s): n/a OT Basic Self-Care Anticipated Outcome(s): supervision OT Toileting Anticipated Outcome(s): supervision OT Bathroom Transfers Anticipated Outcome(s): supervision OT Recommendation Patient destination: Home Follow Up Recommendations: Home health OT Equipment Recommended: To be determined   Skilled Therapeutic Intervention Upon entering the room, pt seated in wheelchair awaiting therapist with no c/o pain this session and agreeable to OT intervention. Pt declined shower. Pt requesting to wash from chair at sink side. OT educated pt on OT purpose, POC, and goals with pt verbalizing understanding and agreement. Pt required overall min A for standing balance with self care tasks with multiple rest breaks secondary to fatigue. Pt currently on 3 L via [place] with O2 saturation remaining above 92% throughout session. Pt required steady assist for balance and sit <>stand at sink. Pt remained in wheelchair with quick release belt donned at end of session. Call bell within reach.   OT Evaluation Precautions/Restrictions  Precautions Precautions: Fall Precaution Comments: 3L/min supplemental oxygen via nasal cannula, catheter Vital Signs Therapy Vitals Pulse Rate: (!) 110 (sitting EOB) BP: 133/88 mmHg Patient Position (if appropriate): Sitting Oxygen Therapy SpO2: 100 % O2 Device: Nasal Cannula O2  Flow Rate (L/min): 3 L/min Pulse Oximetry Type: Intermittent Pain Pain Assessment Pain Assessment: No/denies pain Home Living/Prior Functioning Home Living Available Help at Discharge: Family Type of Home: House Home Access: Stairs to enter [employer] of Steps: 3 Entrance Stairs-Rails: None Home Layout: One level Bathroom Shower/Tub:  Tub/shower unit, Industrial/product designer: Yes  Lives With: Other (Comment) (sister -courtney) IADL History 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 Prior Function Level of Independence: Independent with gait, Independent with transfers, Independent with basic ADLs  Able to Take Stairs?: Yes Driving: No Vocation: Unemployed Leisure: Hobbies-yes (Comment) Comments: bowling,music, wrestling Vision/Perception  Vision- History Baseline Vision/History: No visual deficits  Cognition Overall Cognitive Status: History of cognitive impairments - at baseline Arousal/Alertness: Awake/alert Orientation Level: Person;Place;Situation Person: Oriented Place: Oriented Situation: Oriented Year: 2017 Month:  (unable to state) Day of Week: Other (Comment) (unable to state) Memory: Impaired Immediate Memory Recall: Blue;Bed;Sock Memory Recall:  (pt unable to state.) Attention: Focused Focused Attention: Appears intact Awareness: Impaired Problem Solving: Impaired Behaviors: Impulsive Safety/Judgment: Impaired Comments: Pt is autisitc with baseline cognition of 62-73 year old. Family reports he is at baseline for cognition Sensation Sensation Light Touch: Appears Intact Stereognosis: Not tested Hot/Cold: Not tested Proprioception: Appears Intact Coordination Gross Motor Movements are Fluid and Coordinated: Yes Fine Motor Movements are Fluid and Coordinated: Yes Finger Nose Finger Test:  (7 seconds each BUE, BUE equal) Heel Shin Test:  (BLE equal) Motor  Motor Motor:  (muscle weakness BLE) Motor -  Skilled Clinical Observations: Generalized weakness Mobility  Bed Mobility Bed Mobility: Rolling Left;Supine to Sit;Sit to Supine (maximum verbal cuing for particiopation in activity, without bed rails nor HOB elevated) Rolling Left: 5: Supervision Supine to Sit: 5: Supervision Sit to Supine: 5: Supervision Transfers Transfers: Stand to Sit Sit to Stand: 4: Min guard Sit to Stand Details: Verbal cues for sequencing;Verbal cues for technique Stand to Sit: 4: Min guard  Trunk/Postural Assessment  Cervical Assessment Cervical Assessment: Exceptions to [facility] (forward head) Thoracic Assessment Thoracic Assessment: Exceptions to [facility] Lumbar Assessment Lumbar Assessment: Exceptions to [facility] (posterior pelvic tilt) Postural Control Postural Control: Deficits on evaluation  Balance Balance Balance Assessed: Yes Static Sitting Balance Static Sitting - Balance Support: Bilateral upper extremity supported Static Sitting - Level of Assistance: 6: Modified independent (Device/Increase time) Dynamic Sitting Balance Dynamic Sitting - Balance Support: Bilateral upper extremity supported Dynamic Sitting - Level of Assistance: 5: Stand by assistance Static Standing Balance Static Standing - Balance Support: No upper extremity supported Static Standing - Level of Assistance: 4: Min assist Dynamic Standing Balance Dynamic Standing - Balance Support: During functional activity;Right upper extremity supported;Left upper extremity supported Dynamic Standing - Level of Assistance: 4: Min assist Extremity/Trunk Assessment RUE Assessment RUE Assessment: Within Functional Limits LUE Assessment LUE Assessment: Within Functional Limits   See Function Navigator for Current Functional Status.   Refer to Care Plan for Long Term Goals  Recommendations for other services: None  Discharge Criteria: Patient will be discharged from OT if patient refuses treatment 3 consecutive times without medical reason, if  treatment goals not met, if there is a change in medical status, if patient makes no progress towards goals or if patient is discharged from hospital.  The above assessment, treatment plan, treatment alternatives and goals were discussed and mutually agreed upon: by patient  Phineas Semen 06/11/2015, 12:02 PM

## 2015-06-11 NOTE — Progress Notes (Signed)
[facility] PHYSICAL MEDICINE & REHABILITATION     PROGRESS NOTE  Subjective/Complaints:  Patient seen sitting on edge of his bed, however when he sees physician approaching he lays down in the fetal position and is unwilling to engage.  ROS: Appears to denies CP, SOB, nausea, vomiting, diarrhea, however limited communication.  Objective: Vital Signs: Blood pressure 115/70, pulse 96, temperature 98.8 F (37.1 C), temperature source Oral, resp. rate 18, weight 84.5 kg (186 lb 4.6 oz), SpO2 99 %. No results found.  Recent Labs  06/09/15 1445  WBC 18.3*  HGB 10.0*  HCT 31.4*  PLT 254    Recent Labs  06/09/15 1445 06/10/15 0455  NA 141 139  K 3.3* 3.3*  CL 108 102  GLUCOSE 176* 103*  BUN 11 13  CREATININE 0.83 0.78  CALCIUM 8.0* 8.8*   CBG (last 3)   Recent Labs  06/10/15 0757 06/10/15 1201 06/10/15 1714  GLUCAP 79 124* 129*    Wt Readings from Last 3 Encounters:  06/11/15 84.5 kg (186 lb 4.6 oz)  06/10/15 84.1 kg (185 lb 6.5 oz)  05/16/15 91.536 kg (201 lb 12.8 oz)    Physical Exam:  BP 115/70 mmHg  Pulse 96  Temp(Src) 98.8 F (37.1 C) (Oral)  Resp 18  Wt 84.5 kg (186 lb 4.6 oz)  SpO2 99% Gen: NAD. Well-developed, well-nourished HENT: trach site healed. Normocephalic.  Eyes: Conjunctivae and EOM are normal.  Cardiovascular: Normal rate and regular rhythm previously, however unwilling to allow exam this morning,  Respiratory: Effort normal. No respiratory distress, however unwilling to allow exam this morning,  GI: Soft. Bowel sounds are normal. He exhibits no distension Neurological. Patient was alert. He made limited eye contact. Followed simple commands. Unwilling to participate in sensory or motor exam, however appears to be moving all 4 extremities without difficulty.   Skin. Intact, no breakdown Psych: pt very flat, distracted  Assessment/Plan: 1. Functional deficits secondary to respiratory failure/MRSA pneumonia which require 3+ hours  per day of interdisciplinary therapy in a comprehensive inpatient rehab setting. Physiatrist is providing close team supervision and 24 hour management of active medical problems listed below. Physiatrist and rehab team continue to assess barriers to discharge/monitor patient progress toward functional and medical goals.  Function:  Bathing Bathing position      Bathing parts      Bathing assist        Upper Body Dressing/Undressing Upper body dressing                    Upper body assist        Lower Body Dressing/Undressing Lower body dressing                                  Lower body assist        Toileting Toileting          Toileting assist     Transfers Chair/bed transfer             Locomotion Ambulation           Wheelchair          Cognition Comprehension Comprehension assist level: Understands basic 50 - 74% of the time/ requires cueing 25 - 49% of the time  Expression Expression assist level: Expresses basic 50 - 74% of the time/requires cueing 25 - 49% of the time. Needs to repeat parts of sentences.  Social  Interaction Social Interaction assist level: Interacts appropriately 50 - 74% of the time - May be physically or verbally inappropriate.  Problem Solving Problem solving assist level: Solves basic 50 - 74% of the time/requires cueing 25 - 49% of the time  Memory Memory assist level: Recognizes or recalls 90% of the time/requires cueing < 10% of the time    Medical Problem List and Plan: 1. Debilitation secondary to respiratory failure/MRSA pneumonia.   Contact precautions. All antibiotic therapy is completed.   Taper of by mouth prednisone over 2 weeks. -Begin CIR 2. DVT Prophylaxis/Anticoagulation: Subcutaneous heparin. Monitor for any bleeding episodes 3. Pain Management: Tylenol as needed 4. Mood/severe autism: Klonopin 0.5 mg daily at bedtime, Zyprexa 5 mg twice a day. Patient appears to be at  his baseline per family 5. Neuropsych: This patient is not capable of making decisions on his own behalf. 6. Skin/Wound Care: Routine skin checks 7. Fluids/Electrolytes/Nutrition: Routine I&O's  Labs pending for this morning, previous hypokalemia 8. Dysphagia. Diet advance to mechanical soft thin liquids, will advance as tolerated.   Monitor for any signs of aspiration 9. Acute on chronic anemia. Continue iron supplement.   Labs pending for this morning 10. History of sickle cell trait. 11. Leukocytosis  Likely secondary to steroids  Labs pending for this morning   LOS (Days) 1 A FACE TO FACE EVALUATION WAS PERFORMED  Jasleen Riepe Karis Jubanil Jenaya Saar 06/11/2015 8:06 AM

## 2015-06-11 NOTE — Evaluation (Signed)
Speech Language Pathology Assessment and Plan  Patient Details  Name: Juan Cameron MRN: [id number] Date of Birth: 09-11-1975  SLP Diagnosis: Dysphagia  Rehab Potential: Good ELOS: 10 to 14 days    Today's Date: 06/11/2015 SLP Individual Time: 1400-1500 SLP Individual Time Calculation (min): 60 min   Problem List:  Patient Active Problem List   Diagnosis Date Noted  . Acute blood loss anemia   . Anemia of chronic disease   . Respiratory failure ([place]) 06/10/2015  . Acute on chronic respiratory failure with hypoxia ([place])   . Chronic diastolic CHF (congestive heart failure) ([place])   . Sinus tachycardia ([place])   . Acute renal failure ([place])   . Sickle cell trait ([place])   . Essential hypertension   . SOB (shortness of breath)   . Pneumonia   . Acute pulmonary edema (H[facility])   . Hypoxemia   . Sepsis ([place])   . Cough   . Right knee pain   . Cognitive deficits   . Tachycardia   . Debility 05/06/2015  . Encephalopathy 05/06/2015  . 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 Ventilator dependence ([place]) 04/08/2015  . Pressure ulcer 04/05/2015  . Acute respiratory failure ([place])   . Fever 03/21/2015  . Acute renal insufficiency 03/21/2015  . Obesity 03/21/2015  . ARDS (adult respiratory distress syndrome) ([place])   . HCAP (healthcare-associated pneumonia) 03/11/2015  . Leukocytosis 03/11/2015  . Acute hyperglycemia 03/11/2015  . Left ventricular diastolic dysfunction, NYHA class 2 03/11/2015  . Thrombocytopenia ([place]) 03/11/2015  . Acute lung injury 03/10/2015  . Acute respiratory failure with hypoxia ([place]) 02/28/2015  . CAP (community acquired pneumonia) 02/28/2015  . Hypokalemia 02/28/2015  . Autism 02/28/2015   Past Medical History:  Past Medical History  Diagnosis Date  . Autism   . Sickle cell trait ([place])   . Pneumonia 02/2015   Past Surgical History:  Past Surgical History  Procedure Laterality Date  . No past surgeries      Assessment / Plan /  Recommendation Clinical Impression 40 year old right handed African-American male with past medical history significant for autism with element of attention deficit hyperactivity disorder as well as sickle cell trait. Patient lives with his sister and attends adult daycare workshop daily. Independent prior to admission. Patient originally admitted to [facility] for multilobar pneumonia and discharged to home on 03/10/2015 and still using 4 L of oxygen. He was admitted back to the hospital 04/07/2015 with respiratory failure and ARDS. Patient did require intubation placed on broad-spectrum antibiotics. His hospital stay was complicated by acute kidney injury with dialysis initiated that he did receive a short time and creatinine has now returned back to normal levels at 1.11. Required tracheostomy 03/20/2015 and after failed extubation [facility]. Placed on Provigil for mood stabilization suspect encephalopathy with noted history of autism with good results. He was ultimately decannulated 04/25/2015. His diet was slowly advanced to a regular consistency bouts of tachycardia atrial fibrillation converted back to normal sinus rhythm maintained on Cardizem as well as Lopressor. Vancomycin-resistant enterococci urinary tract infection treated and remains on contact precautions. Patient still with generalized weakness therapies initiated. Patient was admitted for a comprehensive rehabilitation program 05/06/2015. On 05/16/2015 periodic bouts of hypoxia into the high 70s and low 80s and pulmonary services continue to follow. Chest x-ray showed persistent mild interstitial prominence suspicious for mild interstitial edema. Bilateral basilar atelectasis versus infiltrate. Patient had been placed on antibiotic care. On the  morning of 05/16/2015 in need of nonrebreather mask for decreasing saturations. Due to hypoxic event patient was discharged to acute  care services for ongoing evaluation and monitoring. Necessitated need for ventilator support for a short time. Responded well to intravenous Solu-Medrol. Patient's acute respiratory failure felt to be due to MRSA pneumonia pulmonary edema. Maintained on contact precautions. Diet slowly advanced to mechanical soft thin liquids. Psychiatry was consulted 06/05/2015 per patient's autism, depression and initially maintained on Seroquel changed to Zyprexa. Therapies have been resumed patient with better participation with staff. Patient was admitted to inpatient rehabilitation service program 05/06/2015. On 05/16/2015 periodic bouts of hypoxia into the high 70s and low 80s and pulmonary services continue to follow. Chest x-ray showed persistent mild interstitial prominence suspicious for mild interstitial edema. Bilateral basilar atelectasis versus infiltrate. Patient had been placed on antibiotic care. On the morning of 05/16/2015 in need of nonrebreather mask for decreasing saturations. Due to hypoxic event patient was discharged to acute care services for ongoing evaluation and monitoring. Necessitated need for ventilator support for a short time. Responded well to intravenous Solu-Medrol. Patient's acute respiratory failure felt to be due to MRSA pneumonia pulmonary edema. Maintained on contact precautions. Diet slowly advanced to mechanical soft thin liquids. Psychiatry was consulted 06/05/2015 per patient's autism, depression and initially maintained on Seroquel changed to Zyprexa. Therapies have been resumed patient with better participation with staff. Patient was admitted to inpatient rehabilitation services on 06/10/2015 in regards to debilitation related to hypoxia for a comprehensive rehabilitation program.   Bedside Swallow Evaluation administered and pt safely consumed regular trials without overt s/s of dysphagia. Patient with effective mastication, timely oral prep and complete oral clearing. ST  recommends  upgrade to regular diet, continue thin liquids and medicine whole. Patient requires full supervision with meals secondary to premorbid impulsivity for use of compensatory swallow strategies. Pt requires skilled ST during CIR for increased functional independence and reduced caregiver prior to discharge.    Skilled Therapeutic Interventions          Bedside Swallow Evaluation provided see above for details. Pt agreeable to diet upgrade and education provided on use of general aspiration precautions.    SLP Assessment  Patient will need skilled Speech Lanaguage Pathology Services during CIR admission    Recommendations  SLP Diet Recommendations: Thin;Age appropriate regular solids Liquid Administration via: Cup;Straw Medication Administration: Whole meds with liquid Supervision: Patient able to self feed;Full supervision/cueing for compensatory strategies Compensations: Minimize environmental distractions;Slow rate;Small sips/bites Postural Changes and/or Swallow Maneuvers: Seated upright 90 degrees;Upright 30-60 min after meal Oral Care Recommendations: Oral care BID Patient destination: Home Follow up Recommendations:  (TBD) Equipment Recommended: None recommended by SLP    SLP Frequency 3 to 5 out of 7 days   SLP Duration  SLP Intensity  SLP Treatment/Interventions 10 to 14 days  Minumum of 1-2 x/day, 30 to 90 minutes  Dysphagia/aspiration precaution training;Environmental controls;Patient/family education    Pain Pain Assessment Pain Assessment: No/denies pain  Prior Functioning Cognitive/Linguistic Baseline: Baseline deficits Baseline deficit details: PHM Autism Type of Home: House  Lives With: Other (Comment) Available Help at Discharge: Family Vocation: Unemployed  Function:  Eating Eating Eating activity did not occur: N/A Modified Consistency Diet: No Eating Assist Level: Supervision or verbal cues   Eating Set Up Assist For: Opening containers        Cognition Comprehension Comprehension assist level: Understands basic 50 - 74% of the time/ requires cueing 25 - 49% of the time  Expression  Expression assist level: Expresses basic 50 - 74% of the time/requires cueing 25 - 49% of the time. Needs to repeat parts of sentences.  Social Interaction Social Interaction assist level: Interacts appropriately 50 - 74% of the time - May be physically or verbally inappropriate.  Problem Solving Problem solving assist level: Solves basic 50 - 74% of the time/requires cueing 25 - 49% of the time  Memory Memory assist level: Recognizes or recalls 90% of the time/requires cueing < 10% of the time   Short Term Goals: Week 1: SLP Short Term Goal 1 (Week 1): Pt will consume regular textures and thin liquids with mod I use of safe swallowing strategies.   SLP Short Term Goal 2 (Week 1): Pt will name 2 compensatory swallow strategies with Mod I support.   Refer to Care Plan for Long Term Goals  Recommendations for other services: None  Discharge Criteria: Patient will be discharged from SLP if patient refuses treatment 3 consecutive times without medical reason, if treatment goals not met, if there is a change in medical status, if patient makes no progress towards goals or if patient is discharged from hospital.  The above assessment, treatment plan, treatment alternatives and goals were discussed and mutually agreed upon: by patient  Kaio Kuhlman Rutherford Nail 06/11/2015, 3:38 PM

## 2015-06-11 NOTE — Progress Notes (Signed)
Patient information reviewed and entered into eRehab system by Tora DuckMarie Quintavious Rinck, RN, CRRN, PPS Coordinator.  Information including medical coding and functional independence measure will be reviewed and updated through discharge.     Per nursing patient's sister was given "Data Collection Information Summary for Patients in Inpatient Rehabilitation Facilities with attached "Privacy Act Statement-Health Care Records" upon admission.  Familiar with information from previous stay prior to transferring back to acute services.

## 2015-06-11 NOTE — Evaluation (Addendum)
Physical Therapy Assessment and Plan  Patient Details  Name: Juan Cameron MRN: [id number] Date of Birth: [birth year]  PT Diagnosis: Abnormality of gait, Cognitive deficits, Difficulty walking, Impaired cognition and Muscle weakness Rehab Potential: Good ELOS: 10-14 days   Today's Date: 06/11/2015 PT Individual Time: 0900-1030 PT Individual Time Calculation (min): 90 min    Problem List:  Patient Active Problem List   Diagnosis Date Noted  . Acute blood loss anemia   . Anemia of chronic disease   . Respiratory failure ([place]) 06/10/2015  . Acute on chronic respiratory failure with hypoxia ([place])   . Chronic diastolic CHF (congestive heart failure) ([place])   . Sinus tachycardia ([place])   . Acute renal failure ([place])   . Sickle cell trait ([place])   . Essential hypertension   . SOB (shortness of breath)   . Pneumonia   . Acute pulmonary edema (HCC)   . Hypoxemia   . Sepsis ([place])   . Cough   . Right knee pain   . Cognitive deficits   . Tachycardia   . Debility 05/06/2015  . Encephalopathy 05/06/2015  . 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 Ventilator dependence ([place]) 04/08/2015  . Pressure ulcer 04/05/2015  . Acute respiratory failure ([place])   . Fever 03/21/2015  . Acute renal insufficiency 03/21/2015  . Obesity 03/21/2015  . ARDS (adult respiratory distress syndrome) ([place])   . HCAP (healthcare-associated pneumonia) 03/11/2015  . Leukocytosis 03/11/2015  . Acute hyperglycemia 03/11/2015  . Left ventricular diastolic dysfunction, NYHA class 2 03/11/2015  . Thrombocytopenia ([place]) 03/11/2015  . Acute lung injury 03/10/2015  . Acute respiratory failure with hypoxia ([place]) 02/28/2015  . CAP (community acquired pneumonia) 02/28/2015  . Hypokalemia 02/28/2015  . Autism 02/28/2015    Past Medical History:  Past Medical History  Diagnosis Date  . Autism   . Sickle cell trait ([place])   . Pneumonia 02/2015   Past Surgical History:  Past Surgical History  Procedure Laterality Date   . No past surgeries      Assessment & Plan Clinical Impression: Patient is a 40 y.o. year old male, right handed African-American male with past medical history significant for autism with element of attention deficit hyperactivity disorder as well as sickle cell trait. Patient lives with his sister and attends adult daycare workshop daily. Independent prior to admission. Patient originally admitted to [facility] for multilobar pneumonia and discharged to home on 03/10/2015 and still using 4 L of oxygen. He was admitted back to the hospital 04/07/2015 with respiratory failure and ARDS. Patient did require intubation placed on broad-spectrum antibiotics. His hospital stay was complicated by acute kidney injury with dialysis initiated that he did receive a short time and creatinine has now returned back to normal levels at 1.11. Required tracheostomy 03/20/2015 and after failed extubation [facility]. Placed on Provigil for mood stabilization suspect encephalopathy with noted history of autism with good results. He was ultimately decannulated 04/25/2015. His diet was slowly advanced to a regular consistency bouts of tachycardia atrial fibrillation converted back to normal sinus rhythm maintained on Cardizem as well as Lopressor. Vancomycin-resistant enterococci urinary tract infection treated and remains on contact precautions. Patient still with generalized weakness therapies initiated. Patient was admitted for a comprehensive rehabilitation program 05/06/2015. He was ambulating up to 10 feet without assistive device. Needed encouragement to participate in relation to his autism. On 05/16/2015 periodic bouts of hypoxia into the high 70s and low 80s  and pulmonary services continue to follow. Chest x-ray showed persistent mild interstitial prominence suspicious for mild interstitial edema. Bilateral basilar atelectasis versus infiltrate.  Patient had been placed on antibiotic care. On the morning of 05/16/2015 in need of nonrebreather mask for decreasing saturations. Due to hypoxic event patient was discharged to acute care services for ongoing evaluation and monitoring. Necessitated need for ventilator support for a short time. Responded well to intravenous Solu-Medrol. Patient's acute respiratory failure felt to be due to MRSA pneumonia pulmonary edema. Maintained on contact precautions. Diet slowly advanced to mechanical soft thin liquids. Subcutaneous heparin for DVT prophylaxis. Psychiatry was consulted 06/05/2015 per patient's autism, depression and initially maintained on Seroquel changed to Zyprexa. Therapies have been resumed patient with better participation with staff. Patient was admitted to inpatient rehabilitation services in regards to debilitation related to hypoxia for a comprehensive rehabilitation program. Patient transferred to CIR on 06/10/2015 .   Patient currently requires mod with mobility secondary to muscle weakness, decreased cardiorespiratoy endurance and decreased oxygen support, decreased attention, decreased awareness, decreased problem solving, decreased safety awareness, decreased memory and delayed processing, and decreased sitting balance, decreased standing balance and decreased balance strategies.  Prior to hospitalization, patient was independent  with mobility and lived with Other (Comment) (sister - [name]) in a House home.  Home access is 3Stairs to enter.  Patient will benefit from skilled PT intervention to maximize safe functional mobility, minimize fall risk and decrease caregiver burden for planned discharge home with 24 hour supervision.  Anticipate patient will benefit from follow up [place] at discharge.  PT - End of Session Activity Tolerance: Tolerates 30+ min activity with multiple rests Endurance Deficit: Yes Endurance Deficit Description: 2/2 poor cardiovascular endurance & fatigue PT  Assessment Rehab Potential (ACUTE/IP ONLY): Good Barriers to Discharge: Decreased caregiver support PT Patient demonstrates impairments in the following area(s): Balance;Behavior;Endurance;Motor;Pain;Perception;Safety PT Transfers Functional Problem(s): Bed Mobility;Bed to Chair;Car;Furniture PT Locomotion Functional Problem(s): Ambulation;Wheelchair Mobility;Stairs PT Plan PT Intensity: Minimum of 1-2 x/day ,45 to 90 minutes PT Frequency: 5 out of 7 days PT Duration Estimated Length of Stay: 10-14 days PT Treatment/Interventions: Ambulation/gait training;Discharge planning;Functional mobility training;Psychosocial support;Therapeutic Activities;Visual/perceptual remediation/compensation;Wheelchair propulsion/positioning;Therapeutic Exercise;Skin care/wound management;Neuromuscular re-education;Disease management/prevention;Balance/vestibular training;DME/adaptive equipment instruction;Pain management;UE/LE Strength taining/ROM;UE/LE Coordination activities;Stair training;Patient/family education;Community reintegration PT Transfers Anticipated Outcome(s): supervision PT Locomotion Anticipated Outcome(s): supervision PT Recommendation Recommendations for Other Services: Speech consult Follow Up Recommendations: Home health PT;24 hour supervision/assistance Patient destination: Home Equipment Recommended: Rolling walker with 5" wheels  Skilled Therapeutic Intervention Pt received in bed & agreeable to PT, denying c/o pain. PT evaluation & manual testing completed, please see below for details. Pt is poor historian but potentially at baseline level of cognitive function; pt unable to recall home layout consistent with chart. Pt is currently at Mod A level for ambulation at maximum of 15 ft without AD due to decreased balance & righting reactions, decreased step length & heel strike BLE. Pt requires supervision for bed mobility & min A for sit<>stand and stand pivot transfers. Pt is impulsive with  stand>sit transfers requiring maximum cuing for safety. Pt able to negotiate maximum of 4 steps with B rails, min A & maximum encouragement from therapist. Pt on 3L/min supplemental oxygen throughout session & HR increased to 130's, SpO2 = 89% increasing to >90%, after ambulating 10 ft. Pt consistently breaths through mouth despite maximum multimodal cuing for pursed lip breathing. Pt with significantly poor cardiovascular endurance and dependence on supplemental oxygen. At end of session pt left in w/c with  QRB in place; PT educated pt on use of call bell & pt able to return demonstrate.   PT Evaluation Precautions/Restrictions Precautions Precautions: Fall Precaution Comments: 3L/min supplemental oxygen via nasal cannula, catheter  General Chart Reviewed: Yes Response to Previous Treatment: Not applicable Family/Caregiver Present: No   Vital Signs Therapy Vitals Pulse Rate: (!) 110 (sitting EOB) BP: 133/88 mmHg Patient Position (if appropriate): Sitting Oxygen Therapy SpO2: 100 % O2 Device: Nasal Cannula O2 Flow Rate (L/min): 3 L/min Pulse Oximetry Type: Intermittent  Pain Pain Assessment Pain Assessment: No/denies pain   Home Living/Prior Functioning Home Living Available Help at Discharge: Family Type of Home: House Home Access: Stairs to enter Technical brewer of Steps: 3 Entrance Stairs-Rails: None Home Layout: One level  Lives With: Other (Comment) (sister - [name]) Prior Function Level of Independence: Independent with gait;Independent with transfers  Able to Take Stairs?: Yes Driving: No Vocation: Unemployed Comments:  (attended adult day care during day)  Vision/Perception    Does not wear glasses. Reports no change in vision.  Cognition Arousal/Alertness: Awake/alert Orientation Level: Oriented to person;Oriented to place (oriented to city only) Memory: Impaired Awareness: Impaired Problem Solving: Impaired Safety/Judgment: Impaired Pt has  autism & functions at 7-8 y/o level.  Sensation Sensation Light Touch: Appears Intact (BLE) Proprioception: Appears Intact (BLE) Coordination Gross Motor Movements are Fluid and Coordinated: Yes (rapid alternating movements equal BUE) Finger Nose Finger Test:  (7 seconds each BUE, BUE equal) Heel Shin Test:  (BLE equal)  Motor  Motor Motor:  (muscle weakness BLE)   Mobility Bed Mobility Bed Mobility: Rolling Left;Supine to Sit;Sit to Supine (maximum verbal cuing for particiopation in activity, without bed rails nor HOB elevated) Rolling Left: 5: Supervision Supine to Sit: 5: Supervision Sit to Supine: 5: Supervision Transfers Transfers: Yes Sit to Stand: 4: Min guard Sit to Stand Details: Verbal cues for sequencing;Verbal cues for technique Stand to Sit: 4: Min guard (poor eccentric control) Stand Pivot Transfers: 4: Min assist Stand Pivot Transfer Details: Verbal cues for technique;Verbal cues for sequencing (verbal cuing to turn & square up to chair)  Locomotion  Ambulation Ambulation: Yes Ambulation/Gait Assistance: 3: Mod assist Assistive device: None Ambulation/Gait Assistance Details: Verbal cues for sequencing;Verbal cues for technique Gait Gait: Yes Gait Pattern: Decreased step length - left;Decreased step length - right;Decreased stride length;Decreased weight shift to left;Decreased weight shift to right;Decreased hip/knee flexion - right;Decreased hip/knee flexion - left;Shuffle;Narrow base of support (minimal heel strike BLE) Gait velocity:  (decreased gait speed) Stairs / Additional Locomotion Stairs: Yes Stairs Assistance: 4: Min assist Stair Management Technique: Two rails Height of Stairs: 6 Wheelchair Mobility Wheelchair Mobility: Yes Wheelchair Assistance: 3: Mod assist (Mod A for turns, Min A for linear path) Wheelchair Assistance Details: Verbal cues for technique;Manual facilitation for Horticulturist, commercial: Both upper  extremities Wheelchair Parts Management: Needs Physiological scientist Sitting - Balance Support: Bilateral upper extremity supported Static Sitting - Level of Assistance: 6: Modified independent (Device/Increase time) Dynamic Sitting Balance Dynamic Sitting - Balance Support: Bilateral upper extremity supported Dynamic Sitting - Level of Assistance: 5: Stand by assistance Static Standing Balance Static Standing - Balance Support: No upper extremity supported Static Standing - Level of Assistance: 4: Min assist  Extremity Assessment  RLE Assessment RLE Assessment: Exceptions to [facility] RLE AROM (degrees) Overall AROM Right Lower Extremity: Within functional limits for tasks assessed RLE Strength Right Hip Flexion: 3-/5 Right Knee Flexion: 4-/5 Right Knee Extension: 3/5 Right Ankle Dorsiflexion:  3/5 LLE Assessment LLE Assessment: Exceptions to WFL LLE AROM (degrees) Overall AROM Left Lower Extremity: Within functional limits for tasks assessed LLE Strength Left Hip Flexion: 3-/5 Left Knee Flexion: 4-/5 Left Knee Extension: 3/5 Left Ankle Dorsiflexion: 3/5   See Function Navigator for Current Functional Status.   Refer to Care Plan for Long Term Goals  Recommendations for other services: None  Discharge Criteria: Patient will be discharged from PT if patient refuses treatment 3 consecutive times without medical reason, if treatment goals not met, if there is a change in medical status, if patient makes no progress towards goals or if patient is discharged from hospital.  The above assessment, treatment plan, treatment alternatives and goals were discussed and mutually agreed upon: by patient  Waunita Schooner 06/11/2015, 9:16 AM

## 2015-06-11 NOTE — Plan of Care (Signed)
Problem: RH Balance Goal: LTG Patient will maintain dynamic standing balance (PT) LTG: Patient will maintain dynamic standing balance with assistance during mobility activities (PT) With LRAD  Problem: RH Bed Mobility Goal: LTG Patient will perform bed mobility with assist (PT) LTG: Patient will perform bed mobility with assistance, with/without cues (PT). Regular bed     Problem: RH Bed to Chair Transfers Goal: LTG Patient will perform bed/chair transfers w/assist (PT) LTG: Patient will perform bed/chair transfers with assistance, with/without cues (PT). With LRAD  Problem: RH Car Transfers Goal: LTG Patient will perform car transfers with assist (PT) LTG: Patient will perform car transfers with assistance (PT). With LRAD  Problem: RH Furniture Transfers Goal: LTG Patient will perform furniture transfers w/assist (OT/PT LTG: Patient will perform furniture transfers with assistance (OT/PT). With LRAD  Problem: RH Ambulation Goal: LTG Patient will ambulate in controlled environment (PT) LTG: Patient will ambulate in a controlled environment, # of feet with assistance (PT). 150 ft with LRAD Goal: LTG Patient will ambulate in home environment (PT) LTG: Patient will ambulate in home environment, # of feet with assistance (PT). 50 ft with LRAD  Problem: RH Wheelchair Mobility Goal: LTG Patient will propel w/c in controlled environment (PT) LTG: Patient will propel wheelchair in controlled environment, # of feet with assist (PT) 150 ft for cardiovascular endurance trianing  Problem: RH Stairs Goal: LTG Patient will ambulate up and down stairs w/assist (PT) LTG: Patient will ambulate up and down # of stairs with assistance (PT) 3 steps without rails & LRAD

## 2015-06-11 NOTE — IPOC Note (Signed)
Overall Plan of Care [facility](IPOC) Patient Details Name: Juan RipperRobert Cameron MRN: [id number] DOB: [birth year]  Admitting Diagnosis: Debility  Hospital Problems: Principal Problem:   Debility Active Problems:   Autism   Respiratory failure (HCC)   Acute blood loss anemia   Anemia of chronic disease     Functional Problem List: Nursing Bladder, Bowel, Edema, Endurance, Medication Management, Pain, Nutrition, Perception, Safety, Skin Integrity, Sensory  PT Balance, Behavior, Endurance, Motor, Pain, Perception, Safety  OT Balance, Endurance, Motor, Pain, Safety  SLP Cognition  TR         Basic ADL's: OT Grooming, Bathing, Dressing, Toileting     Advanced  ADL's: OT Other (comment) (n/a)     Transfers: PT Bed Mobility, Bed to Chair, Car, Occupational psychologisturniture  OT Toilet, Research scientist (life sciences)Tub/Shower     Locomotion: PT Ambulation, Psychologist, prison and probation servicesWheelchair Mobility, Stairs     Additional Impairments: OT None  SLP Swallowing      TR      Anticipated Outcomes Item Anticipated Outcome  Self Feeding n/a  Swallowing  Mod I   Basic self-care  supervision  Toileting  supervision   Bathroom Transfers supervision  Bowel/Bladder  Min assisit  Transfers  supervision  Locomotion  supervision  Communication     Cognition     Pain  3 or less  Safety/Judgment  min assist   Therapy Plan: PT Intensity: Minimum of 1-2 x/day ,45 to 90 minutes PT Frequency: 5 out of 7 days PT Duration Estimated Length of Stay: 10-14 days OT Intensity: Minimum of 1-2 x/day, 45 to 90 minutes OT Frequency: 5 out of 7 days OT Duration/Estimated Length of Stay: 10-14 days SLP Intensity: Minumum of 1-2 x/day, 30 to 90 minutes SLP Frequency: 3 to 5 out of 7 days SLP Duration/Estimated Length of Stay: 10 to 14 days       Team Interventions: Nursing Interventions Patient/Family Education, Bladder Management, Bowel Management, Disease Management/Prevention, Pain Management, Medication Management, Skin Care/Wound Management, Cognitive  Remediation/Compensation, Dysphagia/Aspiration Precaution Training, Discharge Planning  PT interventions Ambulation/gait training, Discharge planning, Functional mobility training, Psychosocial support, Therapeutic Activities, Visual/perceptual remediation/compensation, Wheelchair propulsion/positioning, Therapeutic Exercise, Skin care/wound management, Neuromuscular re-education, Disease management/prevention, Warden/rangerBalance/vestibular training, DME/adaptive equipment instruction, Pain management, UE/LE Strength taining/ROM, UE/LE Coordination activities, Stair training, Patient/family education, Community reintegration  OT Interventions Warden/rangerBalance/vestibular training, Discharge planning, Community reintegration, Fish farm managerDME/adaptive equipment instruction, Functional mobility training, Patient/family education, Self Care/advanced ADL retraining, Therapeutic Activities, Therapeutic Exercise, UE/LE Strength taining/ROM, UE/LE Coordination activities  SLP Interventions Dysphagia/aspiration precaution training, Environmental controls, Patient/family education  TR Interventions    SW/CM Interventions Discharge Planning, Facilities managersychosocial Support, Patient/Family Education    Team Discharge Planning: Destination: PT-Home ,OT- Home , SLP-Home Projected Follow-up: PT-Home health PT, 24 hour supervision/assistance, OT-  Home health OT, SLP- (TBD) Projected Equipment Needs: PT-Rolling walker with 5" wheels, OT- To be determined, SLP-None recommended by SLP Equipment Details: PT- , OT-  Patient/family involved in discharge planning: PT- Patient unable/family or caregiver not available,  OT-Patient, SLP-Patient  MD ELOS: 7-10 days. Medical Rehab Prognosis:  Good Assessment: 40 year old right handed African-American male with past medical history significant for autism with element of attention deficit hyperactivity disorder as well as sickle cell trait. Patient lives with his sister and attends adult daycare workshop daily.  Independent prior to admission. [facility] and discharged to home on 03/10/2015 and still using 4 L of oxygen. He was admitted back to the hospital 04/07/2015 with respiratory failure and ARDS. Patient did require intubation placed  on broad-spectrum antibiotics. His hospital stay was complicated by acute kidney injury with dialysis initiated that he did receive a short time and creatinine has now returned back to normal levels at 1.11. Required tracheostomy 03/20/2015 and after failed extubation [facility]. Placed on Provigil for mood stabilization suspect encephalopathy with noted history of autism with good results. He was ultimately decannulated 04/25/2015. His diet was slowly advanced to a regular consistency bouts of tachycardia atrial fibrillation converted back to normal sinus rhythm maintained on Cardizem as well as Lopressor. Vancomycin-resistant enterococci urinary tract infection treated and remains on contact precautions. Patient still with generalized weakness therapies initiated. Patient was admitted for a comprehensive rehabilitation program 05/06/2015. He was ambulating up to 10 feet without assistive device. Needed encouragement to participate in relation to his autism. On 05/16/2015 periodic bouts of hypoxia into the high 70s and low 80s and pulmonary services continue to follow. Chest x-ray showed persistent mild interstitial prominence suspicious for mild interstitial edema. Bilateral basilar atelectasis versus infiltrate. Patient had been placed on antibiotic care. On the morning of 05/16/2015 in need of nonrebreather mask for decreasing saturations. Due to hypoxic event patient was discharged to acute care services for ongoing evaluation and monitoring. Necessitated need for ventilator support for a short time. Responded well to intravenous Solu-Medrol. Patient's  acute respiratory failure felt to be due to MRSA pneumonia pulmonary edema. Maintained on contact precautions. Diet slowly advanced to mechanical soft thin liquids. Psychiatry was consulted 06/05/2015 per patient's autism, depression and initially maintained on Seroquel changed to Zyprexa. Therapies have been resumed patient with better participation with staff. Patient was admitted to inpatient rehabilitation services in regards to debilitation and poor endurance related to hypoxia for a comprehensive rehabilitation program. Will set goals for supervision/Min A with therapies.  See Team Conference Notes for weekly updates to the plan of care

## 2015-06-12 ENCOUNTER — Inpatient Hospital Stay (HOSPITAL_COMMUNITY): Payer: Self-pay | Admitting: Occupational Therapy

## 2015-06-12 ENCOUNTER — Inpatient Hospital Stay (HOSPITAL_COMMUNITY): Payer: Medicare Other | Admitting: Speech Pathology

## 2015-06-12 ENCOUNTER — Inpatient Hospital Stay (HOSPITAL_COMMUNITY): Payer: Medicare Other | Admitting: Physical Therapy

## 2015-06-12 DIAGNOSIS — R1310 Dysphagia, unspecified: Secondary | ICD-10-CM | POA: Insufficient documentation

## 2015-06-12 MED ORDER — PREDNISONE 20 MG PO TABS
20.0000 mg | ORAL_TABLET | Freq: Every day | ORAL | Status: DC
  Administered 2015-06-12 – 2015-06-14 (×3): 20 mg via ORAL
  Filled 2015-06-12 (×2): qty 1

## 2015-06-12 MED ORDER — PREDNISONE 20 MG PO TABS: 20.0000 mg | ORAL_TABLET | Freq: Every day | ORAL | Status: DC

## 2015-06-12 NOTE — Progress Notes (Signed)
Social Work Patient ID: Juan Cameron, male   DOB: Dec 28, 1975, 40 y.o.   MRN: [id number] Spoken with sister-[name] via telephone to discuss team conference goals of supervision level and target discharge 6/13. She reports she has paperwork form his home care agency that needs to be competed. Asked  Her to leave with the RN and they will get it to me and I can complete it tomorrow. She is aware pt will require 24 hr supervision at discharge. She reports [facility] and she would have had to pay $200-300 dollars for it. Will make sure he qualifies for home O2 since they can not afford to pay privately for this. Will work on discharge needs.

## 2015-06-12 NOTE — Progress Notes (Signed)
Occupational Therapy Session Note  Patient Details  Name: Juan Cameron MRN: [id number] Date of Birth: 1975-12-06  Today's Date: 06/12/2015 OT Individual Time: 0800-0900 OT Individual Time Calculation (min): 60 min   Short Term Goals: Week 1:  OT Short Term Goal 1 (Week 1): Pt will engage in 10 minutes of functional tasks with 2 or less rest breaks. OT Short Term Goal 2 (Week 1): Pt will perform LB dressing with min A in order to increase I in self care. OT Short Term Goal 3 (Week 1): Pt willl perform clothing managemenmt with contact guard for balance after toileting in order to increase balance with functional tasks.  Skilled Therapeutic Interventions/Progress Updates:  Patient found seated on side of bed with no complaints or signs/symptoms of pain. Pt scooted towards EOB, then transferred EOB to w/c with min guard assist for safety. From here, therapist assisted pt to sink and pt performed UB/LB bathing and dressing in sit to/from stand position. Pt required mod cueing for initiation during session and continued to state "help" when he wasn't sure what to do next. Therapist encouraged self initiation. From here, had pt work on organizing items in room in sitting and standing positions; pt only able to stand ~1 minute before requesting to sit back down. Pt completed grooming task of brushing teeth with encouragement from therapist in sitting position. Therapist donned bilateral TEDs per order, had pt gel out, wiped down w/c, then had pt propel self from room to therapy gym. During w/c propulsion pt required max cues to use arms and not legs, focused on this to increase patient's overall functional strength and endurance. Pt did require a few rest breaks due to generalized weakness. At end of session, left pt seated in w/c with quick release belt donned in gym waiting on PT for next session. During session,  HR=115, 02 sats on 3L/min supplemental via [place]=95% after activity.   Therapy  Documentation Precautions:  Precautions Precautions: Fall Precaution Comments: 3L/min supplemental oxygen via nasal cannula, catheter Restrictions Weight Bearing Restrictions: No  See Function Navigator for Current Functional Status.  Therapy/Group: Individual Therapy  Edwin CapPatricia Mckennah Kretchmer , MS, OTR/L, CLT  06/12/2015, 9:40 AM

## 2015-06-12 NOTE — Progress Notes (Signed)
Social Work  Social Work Assessment and Plan  Patient Details  Name: Juan Cameron MRN: [id number] Date of Birth: 19-Feb-1975  Today's Date: 06/12/2015  Problem List:  Patient Active Problem List   Diagnosis Date Noted  . Dysphagia   . Acute blood loss anemia   . Anemia of chronic disease   . Respiratory failure (HCC) 06/10/2015  . Acute on chronic respiratory failure with hypoxia (HCC)   . Chronic diastolic CHF (congestive heart failure) (HCC)   . Sinus tachycardia (HCC)   . Acute renal failure (HCC)   . Sickle cell trait (HCC)   . Essential hypertension   . SOB (shortness of breath)   . Pneumonia   . Acute pulmonary edema (HCC)   . Hypoxemia   . Sepsis (HCC)   . Cough   . Right knee pain   . Cognitive deficits   . Tachycardia   . Debility 05/06/2015  . Encephalopathy 05/06/2015  . 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 Ventilator dependence (HCC) 04/08/2015  . Pressure ulcer 04/05/2015  . Acute respiratory failure (HCC)   . Fever 03/21/2015  . Acute renal insufficiency 03/21/2015  . Obesity 03/21/2015  . ARDS (adult respiratory distress syndrome) (HCC)   . HCAP (healthcare-associated pneumonia) 03/11/2015  . Leukocytosis 03/11/2015  . Acute hyperglycemia 03/11/2015  . Left ventricular diastolic dysfunction, NYHA class 2 03/11/2015  . Thrombocytopenia (HCC) 03/11/2015  . Acute lung injury 03/10/2015  . Acute respiratory failure with hypoxia (HCC) 02/28/2015  . CAP (community acquired pneumonia) 02/28/2015  . Hypokalemia 02/28/2015  . Autism 02/28/2015   Past Medical History:  Past Medical History  Diagnosis Date  . Autism   . Sickle cell trait (HCC)   . Pneumonia 02/2015   Past Surgical History:  Past Surgical History  Procedure Laterality Date  . No past surgeries     Social History:  reports that he has never smoked. He has never used smokeless tobacco. He reports that he does not drink alcohol or use illicit drugs.  Family / Support Systems Marital  Status: Single Patient Roles: Other (Comment) (Sibling) Other Supports: [name]-sister  [phone]  Aunt [name] Anticipated Caregiver: Sister and other family/friends Ability/Limitations of Caregiver: Courntey works from [employer]8-4pm daily and taken a medical leave from school due to pt's condition Caregiver Availability: Other (Comment) (Working on providing 24 hr care) Family Dynamics: Pt has a small family with his siter, aunt and multiple cousins. They all come by and check on pt and make sure he is comfortable. He does like company and gets lonely here by himself. TR to see to give him activities to do to past the time in between therapies.  Social History Preferred language: English Religion: None Cultural Background: No issues Education: Graduated throught the [employer] program-functions as a 40 yo ( autism) Read: Yes Write: Yes Employment Status: Disabled Fish farm managerLegal Hisotry/Current Legal Issues: No history Guardian/Conservator: [name] is his legal guardian and MD aware to contact her and discuss pt's care and she is his Management consultantdecision maker.    Abuse/Neglect Physical Abuse: Denies Verbal Abuse: Denies Sexual Abuse: Denies Exploitation of patient/patient's resources: Denies Self-Neglect: Denies  Emotional Status Pt's affect, behavior adn adjustment status: Pt is glad to be back on rehab he likes the staff members here. He is tired from therapies due to they are pushing him to do more and he is deconditioned from long hospitalization. He does try his best and does what the staff asks of him. He does smile and participate in therapies. He  wants to do for himself like he did before admission here. Recent Psychosocial Issues: other health issues-ADHD and sickle cell trait managed by PCP Pyschiatric History: No history was seen by psychiatry while on acute and they have him on mood stablizing medication. He seems to be coping appropriately, will provide support while here Substance Abuse  History: No history  Patient / Family Perceptions, Expectations & Goals Pt/Family understanding of illness & functional limitations: Sister talks with the MD daily regarding brother's condition and feels she is being heard. She knows him the best and can see when he begins not feeling well. She is here daily and was staying at night with pt unsure if she still is. Pt knows he is in a hospital and feels better now. Premorbid pt/family roles/activities: brother, nephew, friend, cousin, etc Anticipated changes in roles/activities/participation: resume Pt/family expectations/goals: Pt states: " I want to play my games and am cold."  Sister states: " I want him to be well and be ready to come home when I feel he is ready."  [employer]: Other (Comment) Micah Flesher to Adult Day Care M-F before long hospitalization) Premorbid Home Care/DME Agencies: None Transportation available at discharge: family  Discharge Planning Living Arrangements: Other relatives Support Systems: Other relatives, Friends/neighbors Type of Residence: Private residence Insurance Resources: Armed forces operational officer, [employer] (specify county) Surveyor, quantity Resources: [employer] Financial Screen Referred: No Living Expenses: Lives with family Money Management: Family Does the patient have any problems obtaining your medications?: No Home Management: Sister Patient/Family Preliminary Plans: Return home with sister, who is aware he will require 24 hr supervision upon discharge from here. She is working on people to be with him while she is working, he had a Scientist, product/process development for a short time prior to going to daycare.  Social Work Anticipated Follow Up Needs: HH/OP  Clinical Impression Pleasant gentleman who has been in a hospital for many months due to his illness. His sister is very protective and wants him to be fully ready to go home when it is time. She feels he went home way too early last time and doesn't Want that mistake to  happen again. Pt is happy to be here and has bonded with the staff. Will try to keep busy and entertained so he doesn't; get too lonely while here, when family is not present. He seems to be coping appropriately and  Participating in therapies. MD aware sister wants to be updated every other day regarding pt's condition. Will work on discharge needs.  Lucy Chris 06/12/2015, 3:25 PM

## 2015-06-12 NOTE — Progress Notes (Signed)
Social Work Lucy Chrisebecca G Averleigh Savary, LCSW Social Worker Signed  Patient Care Conference 06/12/2015  8:53 AM    Expand All Collapse All   Inpatient RehabilitationTeam Conference and Plan of Care Update Date: 06/11/2015   Time: 2:30 PM     Patient Name: Juan RipperRobert Cameron       Medical Record Number: [id number]  Date of Birth: 1975/05/15 Sex: Male         Room/Bed: 4W18C/4W18C-01 Payor Info: Payor: MEDICARE / Plan: MEDICARE PART A AND B / Product Type: *No Product type* /    Admitting Diagnosis: Debility   Admit Date/Time:  06/10/2015  5:55 PM Admission Comments: No comment available   Primary Diagnosis:  Debility Principal Problem: Debility    Patient Active Problem List     Diagnosis  Date Noted   .  Dysphagia     .  Acute blood loss anemia     .  Anemia of chronic disease     .  Respiratory failure (HCC)  06/10/2015   .  Acute on chronic respiratory failure with hypoxia (HCC)     .  Chronic diastolic CHF (congestive heart failure) (HCC)     .  Sinus tachycardia (HCC)     .  Acute renal failure (HCC)     .  Sickle cell trait (HCC)     .  Essential hypertension     .  SOB (shortness of breath)     .  Pneumonia     .  Acute pulmonary edema (HCC)     .  Hypoxemia     .  Sepsis (HCC)     .  Cough     .  Right knee pain     .  Cognitive deficits     .  Tachycardia     .  Debility  05/06/2015   .  Encephalopathy  05/06/2015   .  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  Ventilator dependence (HCC)  04/08/2015   .  Pressure ulcer  04/05/2015   .  Acute respiratory failure (HCC)     .  Fever  03/21/2015   .  Acute renal insufficiency  03/21/2015   .  Obesity  03/21/2015   .  ARDS (adult respiratory distress syndrome) (HCC)     .  HCAP (healthcare-associated pneumonia)  03/11/2015   .  Leukocytosis  03/11/2015   .  Acute hyperglycemia  03/11/2015   .  Left ventricular diastolic dysfunction, NYHA class 2  03/11/2015   .  Thrombocytopenia (HCC)  03/11/2015   .  Acute lung injury   03/10/2015   .  Acute respiratory failure with hypoxia (HCC)  02/28/2015   .  CAP (community acquired pneumonia)  02/28/2015   .  Hypokalemia  02/28/2015   .  Autism  02/28/2015     Expected Discharge Date: Expected Discharge Date: 06/24/15  Team Members Present: Physician leading conference: Dr. Maryla MorrowAnkit Patel Social Worker Present: Dossie DerBecky Mehreen Azizi, LCSW Nurse Present: Kennon PortelaJeanna Hicks, RN PT Present: Grier RocherAustin Tucker, PT;Victoria Hyacinth MeekerMiller, Nita SicklePT;Rodney Wishart, PT OT Present: Callie FieldingKatie Pittman, OT SLP Present: Feliberto Gottronourtney Payne, SLP PPS Coordinator present : Tora DuckMarie Noel, RN, CRRN        Current Status/Progress  Goal  Weekly Team Focus   Medical     Debilitation secondary to respiratory failure/MRSA pneumonia   Leukocytosis, hypokalemia  See above   Bowel/Bladder     Continent of Bowel LBM 5/29; Foely cath   Continent of Bowel  abd Bladder  Monitor Toileting need Q shift and as needed    Swallow/Nutrition/ Hydration     Dys. 3 textures with thin liquids  TBD  TBD   ADL's     min A overall with self care tasks from wheelchair, min A stand pivot transfers, multiple rest breaks secondary to poor endurance   supervision overall  activity tolerance, self care retraining, functional transfers, safety   Mobility     mod a to ambulate 15 ft without AD, min A for transfers, mod a for w/c mobility, very poor endurance  supervision for ambulation & transfers with LRAD, supervision for stair negotiation  endurance training, gait training, balance training, stair negotiation   Communication               Safety/Cognition/ Behavioral Observations              Pain     Denied any pain or discomfort  <3 on the scale 0-10  Assess and treate pain Q shift and as needed    Skin     Patient has stage II on the bottm  Patient to skin to be free from infection/breakdwon  monitor skin Q shift and as needed       *See Care Plan and progress notes for long and short-term goals.    Barriers to Discharge:  Autism, MR,  leukocytosis, hypokalemia, foley      Possible Resolutions to Barriers:   Follow labs, d/c foley     Discharge Planning/Teaching Needs:     Home with sister who will need to arrange 24 hr supervision for pt. She continues to be concerned about his medical issues and is now concerned about his white count-MD to address.      Team Discussion:    Goals-supervision level. Has balance and endurance issues from long hospitalization. Currently on 3 liters of O2. Impulsive at times and needs to slow down when eating on Dys 3 diet. Sister concerned about increasing whilte count, will address with MD.   Revisions to Treatment Plan:    New eval    Continued Need for Acute Rehabilitation Level of Care: The patient requires daily medical management by a physician with specialized training in physical medicine and rehabilitation for the following conditions: Daily direction of a multidisciplinary physical rehabilitation program to ensure safe treatment while eliciting the highest outcome that is of practical value to the patient.: Yes Daily medical management of patient stability for increased activity during participation in an intensive rehabilitation regime.: Yes Daily analysis of laboratory values and/or radiology reports with any subsequent need for medication adjustment of medical intervention for : Other;Pulmonary problems;Mood/behavior problems  Lucy Chris 06/12/2015, 8:53 AM                 Lucy Chris, LCSW Social Worker Signed  Patient Care Conference 05/14/2015  3:45 PM    Expand All Collapse All   Inpatient RehabilitationTeam Conference and Plan of Care Update Date: 05/14/2015   Time: 2:00 PM     Patient Name: Juan Cameron       Medical Record Number: [id number]  Date of Birth: 1975-12-24 Sex: Male         Room/Bed: 4W17C/4W17C-01 Payor Info: Payor: MEDICARE / Plan: MEDICARE PART A AND B / Product Type: *No Product type* /    Admitting Diagnosis: Debility   Admit Date/Time:   05/06/2015  5:22 PM Admission Comments: No comment available   Primary Diagnosis:  Debility Principal  Problem: Debility    Patient Active Problem List     Diagnosis  Date Noted   .  Sepsis (HCC)     .  Cough     .  Right knee pain     .  Cognitive deficits     .  Tachycardia     .  Debility  05/06/2015   .  Encephalopathy  05/06/2015   .  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  Ventilator dependence (HCC)  04/08/2015   .  Pressure ulcer  04/05/2015   .  Acute respiratory failure (HCC)     .  Fever  03/21/2015   .  Acute renal insufficiency  03/21/2015   .  Obesity  03/21/2015   .  ARDS (adult respiratory distress syndrome) (HCC)     .  HCAP (healthcare-associated pneumonia)  03/11/2015   .  Leukocytosis  03/11/2015   .  Acute hyperglycemia  03/11/2015   .  Left ventricular diastolic dysfunction, NYHA class 2  03/11/2015   .  Thrombocytopenia (HCC)  03/11/2015   .  Acute lung injury  03/10/2015   .  Acute respiratory failure with hypoxia (HCC)  02/28/2015   .  CAP (community acquired pneumonia)  02/28/2015   .  Hypokalemia  02/28/2015   .  Autism  02/28/2015     Expected Discharge Date: Expected Discharge Date: 05/21/15  Team Members Present: Physician leading conference: Dr. Maryla Morrow Social Worker Present: Dossie Der, LCSW Nurse Present: Chana Bode, RN PT Present: Other (comment) Aleda Grana & Antonietta Jewel) OT Present: Roney Mans, OT SLP Present: Jackalyn Lombard, SLP PPS Coordinator present : Tora Duck, RN, CRRN        Current Status/Progress  Goal  Weekly Team Focus   Medical     Debilitation secondary to respiratory failure/ARDS/multilobar pneumonia with ?PNA  Improve cough, leukocytosis, endurance, mobility   see above   Bowel/Bladder     continent of bowel and bladder/ able to use urinal indep but spills at times/LBM 05/12/15  continent of bowel and bladder  cont. of monitor q shift   Swallow/Nutrition/ Hydration               ADL's      Supervision for BADL and transfers   Supervision for BADL, transfers, and dynamic standing balance   Improved activity tolerance, transfers, sit<>stand, dynamic standing balance, BUE therex   Mobility     supervision for bed mobility & transfers, steady A with RW for gait for maximumof 175 ft but averages 40 ft with max encouragement, stairs with Min A & B rails  supervision for standing balance, Mod I bed mobility, supervision transfers & ambulation with LRAD, 3 stairs without rails & supervision  endurance training, gait & stair training, balance training    Communication               Safety/Cognition/ Behavioral Observations    no unsafe behavior/calls for assistance   min assist  continue to monitor q shift   Pain     no complaints of pain  pain less than or equal to 2  continue to monitor q shift   Skin     no skin breakdown this admission/rash to face-hydrocodone cream to area  min assistance  continue to monitor q shift      *See Care Plan and progress notes for long and short-term goals.    Barriers to Discharge:  Autism, MR, tachy, leukocytosis  Possible Resolutions to Barriers:   Follow labs, improve conditioning and HR, Pulm consult      Discharge Planning/Teaching Needs:   Home with sister she is trying to figure out care for pt until he can return to adult day care. Wants to keep home until fully recovered        Team Discussion:    Goals supervision level-medical issues-pneumonia called in pulmonary and will have swallow eval. Currently on 3 l of O2 to sustain 88-90%. BP issues also. Very deconditioned. Sister wants to be kept updated regarding his medical issues since she is his guardian. Had improved in his activity tolerance before this issue   Revisions to Treatment Plan:    Medical issues    Continued Need for Acute Rehabilitation Level of Care: The patient requires daily medical management by a physician with specialized training in physical medicine and  rehabilitation for the following conditions: Daily direction of a multidisciplinary physical rehabilitation program to ensure safe treatment while eliciting the highest outcome that is of practical value to the patient.: Yes Daily medical management of patient stability for increased activity during participation in an intensive rehabilitation regime.: Yes Daily analysis of laboratory values and/or radiology reports with any subsequent need for medication adjustment of medical intervention for : Other;Pulmonary problems;Mood/behavior problems  Lucy Chris 05/14/2015, 3:45 PM                  Patient ID: Juan Cameron, male   DOB: August 21, 1975, 40 y.o.   MRN: [id number]

## 2015-06-12 NOTE — Progress Notes (Signed)
Speech Language Pathology Daily Session Note  Patient Details  Name: Juan Cameron MRN: [id number] Date of Birth: 1975/06/23  Today's Date: 06/12/2015 Session 1: SLP Individual Time: 1030-1100 SLP Individual Time Calculation (min): 30 min   Session 2: SLP Individual Time: 1500 to 1530 SLP Individual Time Calculation (min): 30 min   Short Term Goals: Week 1: SLP Short Term Goal 1 (Week 1): Pt will consume regular textures and thin liquids with mod I use of safe swallowing strategies.   SLP Short Term Goal 2 (Week 1): Pt will name 2 compensatory swallow strategies with Mod I support.   Skilled Therapeutic Interventions:  Session 1: Skilled treatment session focused on dysphagia goals. SLP facilitated the session by providing Mod I for use of safe swallowing strategies. Pt with decreased impulsivity this session and consumed regular textures with appropriate rate and bolus size. Pt demonstrated adequate mastication, bolus manipulation, timely oral prep and good oral clearing. Pt able to answer "eat slowly" when provided question cues regarding swallow strategies. Pt left in wheelchair, safety belt in place with all needs within reach. Continue current plan of care.   Session 2: Skilled treatment session focused on dysphagia goals. SLP facilitated the session by Mod I for use of swallow strategies during consumption of regular diet. Pt able to implement with increased independence and less impulsivity than previous session. Pt continues to state that he must eat "slow."  Pt left in wheelchair, safety belt in place and all needs within reach. Continue plan of care.   Function:  Eating Eating Eating activity did not occur: N/A Modified Consistency Diet: No Eating Assist Level: Supervision or verbal cues           Cognition Comprehension Comprehension assist level: Understands basic 50 - 74% of the time/ requires cueing 25 - 49% of the time  Expression   Expression assist level:  Expresses basic 25 - 49% of the time/requires cueing 50 - 75% of the time. Uses single words/gestures.  Social Interaction Social Interaction assist level: Interacts appropriately 50 - 74% of the time - May be physically or verbally inappropriate.  Problem Solving Problem solving assist level: Solves basic 50 - 74% of the time/requires cueing 25 - 49% of the time  Memory Memory assist level: Recognizes or recalls 90% of the time/requires cueing < 10% of the time    Pain Pain Assessment Pain Assessment: No/denies pain Pain Score: 0-No pain  Therapy/Group: Individual Therapy  Kayliah Tindol 06/12/2015, 12:09 PM

## 2015-06-12 NOTE — Progress Notes (Signed)
[facility]     PROGRESS NOTE  Subjective/Complaints:  Pt initially seen sitting on his bed.  He does allow me to listen to his heart and lungs and limited physical exam.  However, pt later seen with case manager (who he is found of) and smiles, waives and moves B/l UE freely.  ROS: Appears to denies CP, SOB, nausea, vomiting, diarrhea, however limited communication.  Objective: Vital Signs: Blood pressure 130/79, pulse 95, temperature 97.4 F (36.3 C), temperature source Oral, resp. rate 18, weight 85.5 kg (188 lb 7.9 oz), SpO2 99 %. No results found.  Recent Labs  06/09/15 1445 06/11/15 0800  WBC 18.3* 20.7*  HGB 10.0* 9.8*  HCT 31.4* 31.0*  PLT 254 278    Recent Labs  06/10/15 0455 06/11/15 0800  NA 139 142  K 3.3* 3.9  CL 102 106  GLUCOSE 103* 105*  BUN 13 15  CREATININE 0.78 0.87  CALCIUM 8.8* 9.1   CBG (last 3)   Recent Labs  06/10/15 0757 06/10/15 1201 06/10/15 1714  GLUCAP 79 124* 129*    Wt Readings from Last 3 Encounters:  06/12/15 85.5 kg (188 lb 7.9 oz)  06/10/15 84.1 kg (185 lb 6.5 oz)  05/16/15 91.536 kg (201 lb 12.8 oz)    Physical Exam:  BP 130/79 mmHg  Pulse 95  Temp(Src) 97.4 F (36.3 C) (Oral)  Resp 18  Wt 85.5 kg (188 lb 7.9 oz)  SpO2 99% Gen: NAD. Well-developed, well-nourished HENT: trach site healed. Normocephalic.  Eyes: Conjunctivae and EOM are normal.  Cardiovascular: Normal rate and regular rhythm  Respiratory: Effort normal. No respiratory distress GI: Soft. Bowel sounds are normal. He exhibits no distension Neurological. Patient was alert. Followed simple commands. Moving all 4 extremities without difficulty, likely 4/5 b/l UE.   Skin. Intact, no breakdown Psych: pt very flat, however, animated and engaged depending on person  Assessment/Plan: 1. Functional deficits secondary to respiratory failure/MRSA pneumonia which require 3+ hours per day of interdisciplinary therapy in  a comprehensive inpatient rehab setting. Physiatrist is providing close team supervision and 24 hour management of active medical problems listed below. Physiatrist and rehab team continue to assess barriers to discharge/monitor patient progress toward functional and medical goals.  Function:  Bathing Bathing position   Position: Wheelchair/chair at sink  Bathing parts Body parts bathed by patient: Right arm, Left arm, Abdomen, Chest, Front perineal area, Right upper leg, Left upper leg, Right lower leg, Left lower leg Body parts bathed by helper: Buttocks, Back  Bathing assist Assist Level: Touching or steadying assistance(Pt > 75%)      Upper Body Dressing/Undressing Upper body dressing   What is the patient wearing?: Hospital gown                Upper body assist Assist Level: Set up   Set up : To obtain clothing/put away  Lower Body Dressing/Undressing Lower body dressing   What is the patient wearing?: Hospital Gown                              Lower body assist        Toileting Toileting Toileting activity did not occur: No continent bowel/bladder event        Toileting assist     Transfers Chair/bed transfer   Chair/bed transfer method: Stand pivot Chair/bed transfer assist level: Touching or steadying assistance (Pt > 75%) Chair/bed transfer assistive  device: Armrests     Locomotion Ambulation     Max distance: 15 ft Assist level: Moderate assist (Pt 50 - 74%)   Wheelchair   Type: Manual Max wheelchair distance: 25 ft Assist Level: Moderate assistance (Pt 50 - 74%)  Cognition Comprehension Comprehension assist level: Understands basic 50 - 74% of the time/ requires cueing 25 - 49% of the time  Expression Expression assist level: Expresses basic 50 - 74% of the time/requires cueing 25 - 49% of the time. Needs to repeat parts of sentences.  Social Interaction Social Interaction assist level: Interacts appropriately 50 - 74% of the time -  May be physically or verbally inappropriate.  Problem Solving Problem solving assist level: Solves basic 50 - 74% of the time/requires cueing 25 - 49% of the time  Memory Memory assist level: Recognizes or recalls 90% of the time/requires cueing < 10% of the time    Medical Problem List and Plan: 1. Debilitation secondary to respiratory failure/MRSA pneumonia.   Contact precautions. All antibiotic therapy is completed.   -Cont CIR  Taper of by mouth prednisone over 2 weeks, begin on 6/1 2. DVT Prophylaxis/Anticoagulation: Subcutaneous heparin. Monitor for any bleeding episodes 3. Pain Management: Tylenol as needed 4. Mood/severe autism: Klonopin 0.5 mg daily at bedtime, Zyprexa 5 mg twice a day. Patient appears to be at his baseline per family 5. Neuropsych: This patient is not capable of making decisions on his own behalf. 6. Skin/Wound Care: Routine skin checks 7. Fluids/Electrolytes/Nutrition: Routine I&O's 8. Dysphagia. Advanced to regular diet on 5/31.   Monitor for any signs of aspiration 9. Acute on chronic anemia. Continue iron supplement.   Hb 9.8 on 5/31 10. History of sickle cell trait. 11. Leukocytosis  Likely secondary to steroids  WBCs 20.7 on 5/31  Will cont to monitor  LOS (Days) 2 A FACE TO FACE EVALUATION WAS PERFORMED  Ilo Beamon Karis Jubanil Caitlyn Buchanan 06/12/2015 8:22 AM

## 2015-06-12 NOTE — Patient Care Conference (Signed)
Inpatient RehabilitationTeam Conference and Plan of Care Update Date: 06/11/2015   Time: 2:30 PM    Patient Name: Juan Cameron      Medical Record Number: [id number]  Date of Birth: [birth year] Sex: Male         Room/Bed: 4W18C/4W18C-01 Payor Info: Payor: MEDICARE / Plan: MEDICARE PART A AND B / Product Type: *No Product type* /    Admitting Diagnosis: Debility  Admit Date/Time:  06/10/2015  5:55 PM Admission Comments: No comment available   Primary Diagnosis:  Debility Principal Problem: Debility  Patient Active Problem List   Diagnosis Date Noted  . Dysphagia   . Acute blood loss anemia   . Anemia of chronic disease   . Respiratory failure (HCC) 06/10/2015  . Acute on chronic respiratory failure with hypoxia (HCC)   . Chronic diastolic CHF (congestive heart failure) (HCC)   . Sinus tachycardia (HCC)   . Acute renal failure (HCC)   . Sickle cell trait (HCC)   . Essential hypertension   . SOB (shortness of breath)   . Pneumonia   . Acute pulmonary edema (HCC)   . Hypoxemia   . Sepsis (HCC)   . Cough   . Right knee pain   . Cognitive deficits   . Tachycardia   . Debility 05/06/2015  . Encephalopathy 05/06/2015  . 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 Ventilator dependence (HCC) 04/08/2015  . Pressure ulcer 04/05/2015  . Acute respiratory failure (HCC)   . Fever 03/21/2015  . Acute renal insufficiency 03/21/2015  . Obesity 03/21/2015  . ARDS (adult respiratory distress syndrome) (HCC)   . HCAP (healthcare-associated pneumonia) 03/11/2015  . Leukocytosis 03/11/2015  . Acute hyperglycemia 03/11/2015  . Left ventricular diastolic dysfunction, NYHA class 2 03/11/2015  . Thrombocytopenia (HCC) 03/11/2015  . Acute lung injury 03/10/2015  . Acute respiratory failure with hypoxia (HCC) 02/28/2015  . CAP (community acquired pneumonia) 02/28/2015  . Hypokalemia 02/28/2015  . Autism 02/28/2015    Expected Discharge Date: Expected Discharge Date: 06/24/15  Team  Members Present: Physician leading conference: Dr. Maryla Morrow Social Worker Present: Dossie Der, LCSW Nurse Present: Kennon Portela, RN PT Present: Grier Rocher, PT;Victoria Hyacinth Meeker, Nita Sickle, PT OT Present: Callie Fielding, OT SLP Present: Feliberto Gottron, SLP PPS Coordinator present : Tora Duck, RN, CRRN     Current Status/Progress Goal Weekly Team Focus  Medical   Debilitation secondary to respiratory failure/MRSA pneumonia  Leukocytosis, hypokalemia  See above   Bowel/Bladder   Continent of Bowel LBM 5/29; Foely cath  Continent of Bowel abd Bladder  Monitor Toileting need Q shift and as needed   Swallow/Nutrition/ Hydration   Dys. 3 textures with thin liquids  TBD  TBD   ADL's   min A overall with self care tasks from wheelchair, min A stand pivot transfers, multiple rest breaks secondary to poor endurance  supervision overall  activity tolerance, self care retraining, functional transfers, safety   Mobility   mod a to ambulate 15 ft without AD, min A for transfers, mod a for w/c mobility, very poor endurance  supervision for ambulation & transfers with LRAD, supervision for stair negotiation  endurance training, gait training, balance training, stair negotiation   Communication             Safety/Cognition/ Behavioral Observations            Pain   Denied any pain or discomfort  <3 on the scale 0-10  Assess and treate pain Q shift and as needed  Skin   Patient has stage II on the bottm  Patient to skin to be free from infection/breakdwon  monitor skin Q shift and as needed      *See Care Plan and progress notes for long and short-term goals.  Barriers to Discharge: Autism, MR, leukocytosis, hypokalemia, foley    Possible Resolutions to Barriers:  Follow labs, d/c foley    Discharge Planning/Teaching Needs:    Home with sister who will need to arrange 24 hr supervision for pt. She continues to be concerned about his medical issues and is now concerned about  his white count-MD to address.     Team Discussion:  Goals-supervision level. Has balance and endurance issues from long hospitalization. Currently on 3 liters of O2. Impulsive at times and needs to slow down when eating on Dys 3 diet. Sister concerned about increasing whilte count, will address with MD.  Revisions to Treatment Plan:  New eval   Continued Need for Acute Rehabilitation Level of Care: The patient requires daily medical management by a physician with specialized training in physical medicine and rehabilitation for the following conditions: Daily direction of a multidisciplinary physical rehabilitation program to ensure safe treatment while eliciting the highest outcome that is of practical value to the patient.: Yes Daily medical management of patient stability for increased activity during participation in an intensive rehabilitation regime.: Yes Daily analysis of laboratory values and/or radiology reports with any subsequent need for medication adjustment of medical intervention for : Other;Pulmonary problems;Mood/behavior problems  Erynn Vaca, [name] 06/12/2015, 8:53 AM

## 2015-06-12 NOTE — Progress Notes (Signed)
Physical Therapy Session Note  Patient Details  Name: Juan Cameron MRN: [id number] Date of Birth: 10-Sep-1975  Today's Date: 06/12/2015 PT Individual Time: 0901-1000 AND [id number] PT Individual Time Calculation (min): 59 min   Short Term Goals: Week 1:  PT Short Term Goal 1 (Week 1): Pt will negotiate 4 steps with 1 rail with steady A. PT Short Term Goal 2 (Week 1): Pt will ambulate 50 ft with LRAD & Min A.  PT Short Term Goal 3 (Week 1): Pt will tolerate 15 consecutive minutes of therapy while maintaining >90% SpO2.  Skilled Therapeutic Interventions/Progress Updates:    Patient received sitting in WC in rehab gym with trade off from OT, and agreeable to PT PT instructed patient in Standing tolerance/LE therex including Kicking a soccer ball with BLE 1 minute 30 seconds x 2 with BUE support on RW. PT provided min-mod cues for improved alternating kick motion for simulation of reciprocal gait pattern. Patient noted to have improved hip flexion and terminal knee extension seconary to cues from PT.   PT instructed patient in Standing tolerance/dynamic balance training for beach ball taps with BUE. Ball tossed within and outside BOS for 2 minuntes with intermittent UE support on RW. PT instructed patient in improved weight shift L and R to reach ball outside BOS, but noted decreased participation from Patient if reach required significant challenge to balance.      Gait training for 9140ft with RW x 2 with min-supervision A provided by PT. Patient noted to have decreased step length bilaterally with R step length more impaired than the L. Patient declined to attempt gait training without AD. Min cues provided by PT for improved safety with turns to prepare for transfer to chair.   PT instructed patient in Wii bowling for improved activity participation and dynamic balance training with self generated rotational pertubations, Supervision A provided by PT for improved safety. Sit<>stand with supervision  A-min A x 8 with min cues for improved push through seating surface and increased control with eccentric movement.    WC mobility training performed in hall back to room. Patient utilized BLE propulsion technique by choise with supervision A from PT and min cues for obstacle navigation in room.  Patient left sitting in WC with quick release belt in room   Session 2 Patient received sitting in WC, and initially not agreeable to PT. Following education on the importance of improved therapeutic participation, patient become agreeable to PT.   WC mobility training in hall for 3175ft x 2 with BUE propulsion technique. PT encouraged increased use of BUE to prevent LE fatigue, but patient declined use of UE for WC propulsion.  PT instructed patient in BUE endurance therex to tap beach ball while holding 2lb bar weight for 2 minutes x 2. Beach ball tap also performed without ball weight within and outside BOS while sitting Edge of mat. Patient was  Encouraged to keep BUE above chest level for improved shoulder ROM and increased difficulty with ball tap.   Patient instructed in Wii bowling sitting edge of mat without back support for sitting balance training with rotational pertubations. Patient performed sit<>stand x 3 with wii bowling but declined any prolonged standing tolerance exercises due to HA and fatigue.   Patient returned to room and [facility] with quick release belt in place and call bell within reach.    Throughout both therapy sessions on this day the patient's SpO2 remained >95% on 3L/min via nasal canula.  Therapy Documentation Precautions:  Precautions Precautions: Fall Precaution Comments: 3L/min supplemental oxygen via nasal cannula, catheter Restrictions Weight Bearing Restrictions: No Pain: Pain Assessment Pain Assessment: No/denies pain Pain Score: 0-No pain   See Function Navigator for Current Functional Status.   Therapy/Group: Individual Therapy  Golden Pop 06/12/2015, 12:31 PM

## 2015-06-13 ENCOUNTER — Inpatient Hospital Stay (HOSPITAL_COMMUNITY): Payer: Medicare Other

## 2015-06-13 ENCOUNTER — Inpatient Hospital Stay (HOSPITAL_COMMUNITY): Payer: Self-pay | Admitting: Physical Therapy

## 2015-06-13 ENCOUNTER — Inpatient Hospital Stay (HOSPITAL_COMMUNITY): Payer: Self-pay | Admitting: Occupational Therapy

## 2015-06-13 ENCOUNTER — Inpatient Hospital Stay (HOSPITAL_COMMUNITY): Payer: Medicare Other | Admitting: Speech Pathology

## 2015-06-13 ENCOUNTER — Inpatient Hospital Stay (HOSPITAL_COMMUNITY): Payer: Medicare Other | Admitting: Occupational Therapy

## 2015-06-13 LAB — CBC WITH DIFFERENTIAL/PLATELET
BASOS ABS: 0 10*3/uL (ref 0.0–0.1)
Basophils Relative: 0 %
EOS PCT: 2 %
Eosinophils Absolute: 0.5 10*3/uL (ref 0.0–0.7)
HCT: 31.3 % — ABNORMAL LOW (ref 39.0–52.0)
HEMOGLOBIN: 9.7 g/dL — AB (ref 13.0–17.0)
LYMPHS PCT: 19 %
Lymphs Abs: 3.9 10*3/uL (ref 0.7–4.0)
MCH: 26.6 pg (ref 26.0–34.0)
MCHC: 31 g/dL (ref 30.0–36.0)
MCV: 86 fL (ref 78.0–100.0)
Monocytes Absolute: 2 10*3/uL — ABNORMAL HIGH (ref 0.1–1.0)
Monocytes Relative: 9 %
NEUTROS ABS: 14.8 10*3/uL — AB (ref 1.7–7.7)
NEUTROS PCT: 70 %
PLATELETS: 283 10*3/uL (ref 150–400)
RBC: 3.64 MIL/uL — AB (ref 4.22–5.81)
RDW: 17.7 % — ABNORMAL HIGH (ref 11.5–15.5)
WBC: 21.3 10*3/uL — AB (ref 4.0–10.5)

## 2015-06-13 LAB — URINALYSIS, ROUTINE W REFLEX MICROSCOPIC
Bilirubin Urine: NEGATIVE
GLUCOSE, UA: NEGATIVE mg/dL
HGB URINE DIPSTICK: NEGATIVE
KETONES UR: NEGATIVE mg/dL
LEUKOCYTES UA: NEGATIVE
Nitrite: NEGATIVE
PROTEIN: NEGATIVE mg/dL
Specific Gravity, Urine: 1.012 (ref 1.005–1.030)
pH: 5.5 (ref 5.0–8.0)

## 2015-06-13 NOTE — Progress Notes (Signed)
Physical Therapy Session Note  Patient Details  Name: Juan Cameron MRN: [id number] Date of Birth: September 03, 1975  Today's Date: 06/13/2015 PT Individual Time: [id number] AND [id number] PT Individual Time Calculation (min): 54 min AND 30 min   Short Term Goals: Week 1:  PT Short Term Goal 1 (Week 1): Pt will negotiate 4 steps with 1 rail with steady A. PT Short Term Goal 2 (Week 1): Pt will ambulate 50 ft with LRAD & Min A.  PT Short Term Goal 3 (Week 1): Pt will tolerate 15 consecutive minutes of therapy while maintaining >90% SpO2.  Skilled Therapeutic Interventions/Progress Updates:    Patient received sitting in and agreeable to PT.  Patient performed WC mobility in hall for 14050ft x 2 and 75 ft using BLE propulsion technique with supervision A from PT for encouragement for continued participation and decreased speed to prevent excessive fatigue.  Gait training for 6660ft with RW and supervision A. Gait training for 7260ftx 2 without AD with supervision A and occasional min A. Patient noted to have decreased step length bilaterally with poor heel contact.  Patient performed standing balance/standing tolerance training to shoot basketball into goal x 10 with dynamic balance training to catch ball prior to each shot. PT provided min- supervision A from PT for improved balance and safety.   PT attempted to perform BERG balance test, but patient unwilling to participate.   Patient SpO2 remained >94% on 3L/min throughout treatment.   [facility]   Session 2  Patient received sitting in WC and agreeable to PT. Sister present for treatment.  WC mobility in hall for 4975ft x 2, 7780ft and 60 ft with supervision A from PT and min cues for increased participation and obstacle navigation in hall. Patient continues to self select BUE propulsion technique and reports that he does not want to use his arms to push WC  Gait training in therapy gym for 1830ft x 2 with min A from PT and  mod A x 1 to prevent lateral LOB. Patient noted to have increased trendelenburg gait pattern compared to morning gait training and increased lateral trunk lean to compensate for hip instability.   PT educated sister and patient's rehab potential and probable discharge with AD for improved balance, stability and energy conservation. Sister sounded agreeable to PT prognosis For safe mobility.   Patient returned to room and [facility] with sister present and call bell within reach.   Throughout treatment patient's SpO2 remained >96% on 3L/min of supplemental O2.    Therapy Documentation Precautions:  Precautions Precautions: Fall Precaution Comments: 3L/min supplemental oxygen via nasal cannula, catheter Restrictions Weight Bearing Restrictions: No   Pain: Pain Assessment Pain Assessment: Faces Faces Pain Scale: Hurts a little bit Pain Location: Leg Pain Orientation: Right;Left   See Function Navigator for Current Functional Status.   Therapy/Group: Individual Therapy  Golden Popustin E Saadiq Poche 06/13/2015, 9:57 AM

## 2015-06-13 NOTE — Progress Notes (Signed)
[facility] PHYSICAL MEDICINE & REHABILITATION     PROGRESS NOTE  Subjective/Complaints:  Pt sitting up at edge of bed.He is upset about his breakfast.  He motions that he is not fine, but unwilling to elaborate.    ROS: Appears to denies CP, SOB, nausea, vomiting, diarrhea, however limited communication.  Objective: Vital Signs: Blood pressure 139/93, pulse 106, temperature 97.5 F (36.4 C), temperature source Oral, resp. rate 18, weight 89.2 kg (196 lb 10.4 oz), SpO2 98 %. No results found.  Recent Labs  06/11/15 0800 06/13/15 0500  WBC 20.7* 21.3*  HGB 9.8* 9.7*  HCT 31.0* 31.3*  PLT 278 283    Recent Labs  06/11/15 0800  NA 142  K 3.9  CL 106  GLUCOSE 105*  BUN 15  CREATININE 0.87  CALCIUM 9.1   CBG (last 3)   Recent Labs  06/10/15 1201 06/10/15 1714  GLUCAP 124* 129*    Wt Readings from Last 3 Encounters:  06/13/15 89.2 kg (196 lb 10.4 oz)  06/10/15 84.1 kg (185 lb 6.5 oz)  05/16/15 91.536 kg (201 lb 12.8 oz)    Physical Exam:  BP 139/93 mmHg  Pulse 106  Temp(Src) 97.5 F (36.4 C) (Oral)  Resp 18  Wt 89.2 kg (196 lb 10.4 oz)  SpO2 98% Gen: NAD. Well-developed, well-nourished HENT: trach site healed. Normocephalic.  Eyes: Conjunctivae and EOM are normal.  Cardiovascular: Normal rate and regular rhythm  Respiratory: Effort normal. No respiratory distress GI: Soft. Bowel sounds are normal. He exhibits no distension Neurological. Patient was alert. Followed simple commands occasionally. Moving all 4 extremities without difficulty, likely 4/5 b/l UE.   Skin. Intact, no breakdown Psych: pt very flat, however, animated and engaged depending on person  Assessment/Plan: 1. Functional deficits secondary to respiratory failure/MRSA pneumonia which require 3+ hours per day of interdisciplinary therapy in a comprehensive inpatient rehab setting. Physiatrist is providing close team supervision and 24 hour management of active medical problems  listed below. Physiatrist and rehab team continue to assess barriers to discharge/monitor patient progress toward functional and medical goals.  Function:  Bathing Bathing position   Position: Wheelchair/chair at sink  Bathing parts Body parts bathed by patient: Right arm, Left arm, Abdomen, Chest, Front perineal area, Right upper leg, Left upper leg, Right lower leg, Left lower leg Body parts bathed by helper: Buttocks, Back  Bathing assist Assist Level: Touching or steadying assistance(Pt > 75%)   Set up : To obtain items  Upper Body Dressing/Undressing Upper body dressing   What is the patient wearing?: Pull over shirt/dress     Pull over shirt/dress - Perfomed by patient: Thread/unthread right sleeve, Thread/unthread left sleeve, Put head through opening, Pull shirt over trunk          Upper body assist Assist Level: Set up   Set up : To obtain clothing/put away  Lower Body Dressing/Undressing Lower body dressing   What is the patient wearing?: Pants, Shoes, [employer] - Performed by patient: Thread/unthread right underwear leg, Thread/unthread left underwear leg, Pull underwear up/down   Pants- Performed by patient: Thread/unthread right pants leg, Thread/unthread left pants leg, Pull pants up/down, Fasten/unfasten pants           Shoes - Performed by patient: Don/doff right shoe, Don/doff left shoe, Fasten right, Fasten left         TED Hose - Performed by helper: Don/doff right TED hose, Don/doff left TED hose  Lower body assist Assist for lower body  dressing: Set up, Touching or steadying assistance (Pt > 75%)   Set up : To obtain clothing/put away  Toileting Toileting Toileting activity did not occur: Safety/medical concerns        Toileting assist     Transfers Chair/bed transfer Chair/bed transfer activity did not occur: Safety/medical concerns Chair/bed transfer method: Squat pivot Chair/bed transfer assist level: Touching or steadying  assistance (Pt > 75%) Chair/bed transfer assistive device: Patent attorneyWalker     Locomotion Ambulation     Max distance: 3740ft Assist level: Touching or steadying assistance (Pt > 75%)   Wheelchair   Type: Manual Max wheelchair distance: 14750ft Assist Level: Supervision or verbal cues  Cognition Comprehension Comprehension assist level: Understands basic 50 - 74% of the time/ requires cueing 25 - 49% of the time  Expression Expression assist level: Expresses basic 25 - 49% of the time/requires cueing 50 - 75% of the time. Uses single words/gestures.  Social Interaction Social Interaction assist level: Interacts appropriately 50 - 74% of the time - May be physically or verbally inappropriate.  Problem Solving Problem solving assist level: Solves basic 50 - 74% of the time/requires cueing 25 - 49% of the time  Memory Memory assist level: Recognizes or recalls 90% of the time/requires cueing < 10% of the time    Medical Problem List and Plan: 1. Debilitation secondary to respiratory failure/MRSA pneumonia.   Contact precautions. All antibiotic therapy is completed.   -Cont CIR  Taper of by mouth prednisone over 2 weeks, begin on 6/1 2. DVT Prophylaxis/Anticoagulation: Subcutaneous heparin. Monitor for any bleeding episodes 3. Pain Management: Tylenol as needed 4. Mood/severe autism: Klonopin 0.5 mg daily at bedtime, Zyprexa 5 mg twice a day. Patient appears to be at his baseline per family 5. Neuropsych: This patient is not capable of making decisions on his own behalf. 6. Skin/Wound Care: Routine skin checks 7. Fluids/Electrolytes/Nutrition: Routine I&O's 8. Dysphagia. Advanced to regular diet on 5/31.   Monitor for any signs of aspiration 9. Acute on chronic anemia. Continue iron supplement.   Hb 9.7 on 6/2 10. History of sickle cell trait. 11. Leukocytosis  Possibly secondary to steroids  WBCs 21.3 on 6/2  Will cont to monitor  Will order CXR  Will order UA/ucx  LOS (Days) 3 A FACE  TO FACE EVALUATION WAS PERFORMED  Jamus Loving Karis Jubanil Kiyra Slaubaugh 06/13/2015 8:53 AM

## 2015-06-13 NOTE — Progress Notes (Signed)
Speech Language Pathology Daily Session Note  Patient Details  Name: Juan Cameron MRN: [id number] Date of Birth: 1975-01-19  Today's Date: 06/13/2015 SLP Individual Time: 1345-1430 SLP Individual Time Calculation (min): 45 min  Short Term Goals: Week 1: SLP Short Term Goal 1 (Week 1): Pt will consume regular textures and thin liquids with mod I use of safe swallowing strategies.   SLP Short Term Goal 1 - Progress (Week 1): Met SLP Short Term Goal 2 (Week 1): Pt will name 2 compensatory swallow strategies with Mod I support.  SLP Short Term Goal 3 (Week 1): Pt to demonstrate sustained phonation with easy onset for 3 seconds with mod A. SLP Short Term Goal 4 (Week 1): Pt to demonstrate inhalation over a period of 2 seconds with mod A.  Skilled Therapeutic Interventions: Pt seen for dysphagia and speech therapy. Pt's sister present for session and she verbalized concern re: pt's rough vocal quality which limits intelligibility. Pt was only able to demonstrate sustained phonation for 1 second. Began training for increased inhalation- some success with use of a straw to increase automatic inhalation. Pt able to demonstrate eating regular consistency WNL at mod I. Will add goal for voicing/respiratory support.   Function:  Eating Eating   Modified Consistency Diet: No Eating Assist Level: Supervision or verbal cues   Eating Set Up Assist For: Opening containers       Cognition Comprehension Comprehension assist level: Understands basic 75 - 89% of the time/ requires cueing 10 - 24% of the time  Expression   Expression assist level: Expresses basic 50 - 74% of the time/requires cueing 25 - 49% of the time. Needs to repeat parts of sentences.  Social Interaction Social Interaction assist level: Interacts appropriately 75 - 89% of the time - Needs redirection for appropriate language or to initiate interaction.  Problem Solving Problem solving assist level: Solves basic 50 - 74% of the  time/requires cueing 25 - 49% of the time  Memory Memory assist level: Recognizes or recalls 90% of the time/requires cueing < 10% of the time    Pain Pain Assessment Pain Assessment: Faces Faces Pain Scale: No hurt  Therapy/Group: Individual Therapy  [name] 06/13/2015, 4:10 PM

## 2015-06-13 NOTE — Progress Notes (Signed)
Social Work Patient ID: Juan Cameron, male   DOB: 05/22/1975, 40 y.o.   MRN: [id number] Met with [name]-sister who was here due to getting off work early. She needs a letter from MD regarding pt's level of function and discharge needs. Have typed one up awaiting MD's signature. Sister aware will be ready on Monday. She is hoping to get CAP services for pt once discharged from rehab. She is aware he will require 24 hr care. She will stay and attend his afternoon therapy sessions.

## 2015-06-13 NOTE — Progress Notes (Signed)
Occupational Therapy Session Note  Patient Details  Name: Juan Cameron MRN: [id number] Date of Birth: [birth year]  Today's Date: 06/13/2015 OT Individual Time: 0830-0900 OT Individual Time Calculation (min): 30 min    Short Term Goals: Week 1:  OT Short Term Goal 1 (Week 1): Pt will engage in 10 minutes of functional tasks with 2 or less rest breaks. OT Short Term Goal 2 (Week 1): Pt will perform LB dressing with min A in order to increase I in self care. OT Short Term Goal 3 (Week 1): Pt willl perform clothing managemenmt with contact guard for balance after toileting in order to increase balance with functional tasks.     Skilled Therapeutic Interventions/Progress Updates:    Pt seen for skilled OT to facilitate ADL skills with a focus on decreased impulsivity with eating and balance with sit to stand skills. Pt ate breakfast with cues for small bites and to slow down between bites. Cues to keep O2 on and positioning of nasal cannula in nose. Bathed self EOB and then donned clothing. Min A to tie shoes. Pt able to sit to stand with S to RW with occasional steadying A in standing. Pt in room with all needs met. Hand off to PT.  Therapy Documentation Precautions:  Precautions Precautions: Fall Precaution Comments: 3L/min supplemental oxygen via nasal cannula, catheter Restrictions Weight Bearing Restrictions: No     Pain: Pain Assessment Pain Assessment: No/denies pain ADL:  See Function Navigator for Current Functional Status.   Therapy/Group: Individual Therapy  Attilio Zeitler 06/13/2015, 9:53 AM

## 2015-06-13 NOTE — Progress Notes (Signed)
Occupational Therapy Session Note  Patient Details  Name: Juan Cameron MRN: [id number] Date of Birth: Aug 21, 1975  Today's Date: 06/13/2015 OT Individual Time:  - 1100-1200  (60 min)      Short Term Goals: Week 1:  OT Short Term Goal 1 (Week 1): Pt will engage in 10 minutes of functional tasks with 2 or less rest breaks. OT Short Term Goal 2 (Week 1): Pt will perform LB dressing with min A in order to increase I in self care. OT Short Term Goal 3 (Week 1): Pt willl perform clothing managemenmt with contact guard for balance after toileting in order to increase balance with functional tasks.  Skilled Therapeutic Interventions/Progress Updates:      Pt agreed to OT after much encouragement and increased time.  Pt on 3 liters of oxygen.  Pt propelled wc with mod assist to gym.  Pt transferred to SCI fit but only utilized for 1 min.  Trasnfer to mat.  Engaged in [place] for 2 sets in sitting.  Pt saw other people whom he had met in the past few days and was very social and happy to see them.  Pt stated repeatedly he wanted to eat.  Pt propelled wc back to room.  Left with safety belt on and all needs in reach.         Therapy Documentation Precautions:  Precautions Precautions: Fall Precaution Comments: 3L/min supplemental oxygen via nasal cannula, catheter Restrictions Weight Bearing Restrictions: No      Pain: Pain Assessment Pain Assessment: Faces Faces Pain Scale: No hurt     See Function Navigator for Current Functional Status.   Therapy/Group: Individual Therapy  Lisa Roca 06/13/2015, 4:27 PM

## 2015-06-14 ENCOUNTER — Inpatient Hospital Stay (HOSPITAL_COMMUNITY): Payer: Medicare Other

## 2015-06-14 ENCOUNTER — Inpatient Hospital Stay (HOSPITAL_COMMUNITY): Payer: Self-pay | Admitting: Physical Therapy

## 2015-06-14 ENCOUNTER — Inpatient Hospital Stay (HOSPITAL_COMMUNITY): Payer: Medicare Other | Admitting: Occupational Therapy

## 2015-06-14 LAB — CBC WITH DIFFERENTIAL/PLATELET
Basophils Absolute: 0 10*3/uL (ref 0.0–0.1)
Basophils Relative: 0 %
EOS ABS: 0.4 10*3/uL (ref 0.0–0.7)
Eosinophils Relative: 2 %
HCT: 28.1 % — ABNORMAL LOW (ref 39.0–52.0)
HEMOGLOBIN: 8.8 g/dL — AB (ref 13.0–17.0)
LYMPHS ABS: 3.2 10*3/uL (ref 0.7–4.0)
Lymphocytes Relative: 15 %
MCH: 26.3 pg (ref 26.0–34.0)
MCHC: 31.3 g/dL (ref 30.0–36.0)
MCV: 83.9 fL (ref 78.0–100.0)
MONO ABS: 2.6 10*3/uL — AB (ref 0.1–1.0)
MONOS PCT: 12 %
NEUTROS PCT: 71 %
Neutro Abs: 14.9 10*3/uL — ABNORMAL HIGH (ref 1.7–7.7)
Platelets: 278 10*3/uL (ref 150–400)
RBC: 3.35 MIL/uL — ABNORMAL LOW (ref 4.22–5.81)
RDW: 17.6 % — ABNORMAL HIGH (ref 11.5–15.5)
WBC: 21.2 10*3/uL — ABNORMAL HIGH (ref 4.0–10.5)

## 2015-06-14 MED ORDER — PREDNISONE 5 MG PO TABS
10.0000 mg | ORAL_TABLET | Freq: Every day | ORAL | Status: DC
  Administered 2015-06-15 – 2015-06-23 (×9): 10 mg via ORAL
  Filled 2015-06-14 (×9): qty 2

## 2015-06-14 NOTE — Progress Notes (Signed)
Occupational Therapy Session Note  Patient Details  Name: Juan Cameron MRN: [id number] Date of Birth: 10/04/1975  Today's Date: 06/14/2015 OT Individual Time: 0815-0900 OT Individual Time Calculation (min): 45 min    Short Term Goals: Week 1:  OT Short Term Goal 1 (Week 1): Pt will engage in 10 minutes of functional tasks with 2 or less rest breaks. OT Short Term Goal 2 (Week 1): Pt will perform LB dressing with min A in order to increase I in self care. OT Short Term Goal 3 (Week 1): Pt willl perform clothing managemenmt with contact guard for balance after toileting in order to increase balance with functional tasks.  Skilled Therapeutic Interventions/Progress Updates:    OT session focused on transfers, standing balance, task initiation/completion, and functional endurance. Pt received sitting in bed agreeable to bathing. Completed stand pivot transfer bed>w/c with CGA and no AD. Pt required moderate cues for task initiation and thoroughness during bathing. Pt required supervision for sit<>stand and CGA for standing balance when managing clothing around waist. OT provided encouragement for oral care in standing however pt declined. Pt on 3L of O2 throughout session with SpO2 >90%. Pt required long rest breaks following tasks in standing secondary to increased HR and SOB. At end of session pt left sitting in w/c with quick release belt donned and all needs in reach.   Therapy Documentation Precautions:  Precautions Precautions: Fall Precaution Comments: 3L/min supplemental oxygen via nasal cannula, catheter Restrictions Weight Bearing Restrictions: No General:   Vital Signs:  Pain: Pain Assessment Pain Assessment: No/denies pain  See Function Navigator for Current Functional Status.   Therapy/Group: Individual Therapy  Daneil Danerkinson, Dyan Creelman N 06/14/2015, 8:56 AM

## 2015-06-14 NOTE — Progress Notes (Addendum)
PHYSICAL MEDICINE & REHABILITATION     PROGRESS NOTE  Subjective/Complaints:  Patient seen lying in bed. He is angry that he is woken up. He states his sleepy and doesn't need anything.    ROS: Appears to denies CP, SOB, nausea, vomiting, diarrhea, however limited communication.  Objective: Vital Signs: Blood pressure 136/85, pulse 112, temperature 98.5 F (36.9 C), temperature source Oral, resp. rate 18, weight 86.1 kg (189 lb 13.1 oz), SpO2 98 %. Dg Chest Port 1 View  06/13/2015  CLINICAL DATA:  Leukocytosis EXAM: PORTABLE CHEST 1 VIEW COMPARISON:  06/07/2015 FINDINGS: Low lung volumes. Mild bibasilar opacities, favored to reflect atelectasis. No pleural effusion or pneumothorax. The heart is normal size. Right arm PICC terminates the cavoatrial junction. IMPRESSION: Low lung volumes with bibasilar opacities, favored to reflect atelectasis. Electronically Signed   By: Charline Bills M.D.   On: 06/13/2015 15:14    Recent Labs  06/13/15 0500 06/14/15 0442  WBC 21.3* 21.2*  HGB 9.7* 8.8*  HCT 31.3* 28.1*  PLT 283 278   No results for input(s): NA, K, CL, GLUCOSE, BUN, CREATININE, CALCIUM in the last 72 hours.  Invalid input(s): CO CBG (last 3)  No results for input(s): GLUCAP in the last 72 hours.  Wt Readings from Last 3 Encounters:  06/14/15 86.1 kg (189 lb 13.1 oz)  06/10/15 84.1 kg (185 lb 6.5 oz)  05/16/15 91.536 kg (201 lb 12.8 oz)    Physical Exam:  BP 136/85 mmHg  Pulse 112  Temp(Src) 98.5 F (36.9 C) (Oral)  Resp 18  Wt 86.1 kg (189 lb 13.1 oz)  SpO2 98% Gen: NAD. Well-developed, well-nourished HENT: trach site healed. Normocephalic.  Eyes: Conjunctivae and EOM are normal.  Cardiovascular: Normal rate and regular rhythm  Respiratory: Effort normal. No respiratory distress GI: Soft. Bowel sounds are normal. He exhibits no distension Neurological. Patient was alert. Followed simple commands occasionally. Moving all 4 extremities without  difficulty, likely 4/5 b/l UE.   Skin. Intact, no breakdown Psych: pt very flat, however, animated and engaged depending on person  Assessment/Plan: 1. Functional deficits secondary to respiratory failure/MRSA pneumonia which require 3+ hours per day of interdisciplinary therapy in a comprehensive inpatient rehab setting. Physiatrist is providing close team supervision and 24 hour management of active medical problems listed below. Physiatrist and rehab team continue to assess barriers to discharge/monitor patient progress toward functional and medical goals.  Function:  Bathing Bathing position   Position: Wheelchair/chair at sink  Bathing parts Body parts bathed by patient: Right arm, Left arm, Abdomen, Chest, Front perineal area, Right upper leg, Left upper leg, Right lower leg, Left lower leg, Buttocks Body parts bathed by helper: Back  Bathing assist Assist Level: Touching or steadying assistance(Pt > 75%) (CGA for standing balance)   Set up : To obtain items  Upper Body Dressing/Undressing Upper body dressing   What is the patient wearing?: Pull over shirt/dress     Pull over shirt/dress - Perfomed by patient: Thread/unthread right sleeve, Thread/unthread left sleeve, Put head through opening, Pull shirt over trunk          Upper body assist Assist Level: Set up   Set up : To obtain clothing/put away  Lower Body Dressing/Undressing Lower body dressing   What is the patient wearing?: Pants, Non-skid slipper socks Underwear - Performed by patient: Thread/unthread right underwear leg, Thread/unthread left underwear leg, Pull underwear up/down   Pants- Performed by patient: Thread/unthread right pants leg, Thread/unthread left pants leg,  Pull pants up/down, Fasten/unfasten pants   Non-skid slipper socks- Performed by patient: Don/doff left sock, Don/doff right sock       Shoes - Performed by patient: Don/doff right shoe, Don/doff left shoe Shoes - Performed by helper:  Fasten right, Fasten left       TED Hose - Performed by helper: Don/doff right TED hose, Don/doff left TED hose  Lower body assist Assist for lower body dressing: Set up, Supervision or verbal cues   Set up : To obtain clothing/put away  Toileting Toileting Toileting activity did not occur: Safety/medical concerns Toileting steps completed by patient: Adjust clothing prior to toileting, Performs perineal hygiene, Adjust clothing after toileting Toileting steps completed by helper: Adjust clothing prior to toileting, Performs perineal hygiene, Adjust clothing after toileting (per Epimenio FootErin Neal, NT report) Toileting Assistive Devices: Grab bar or rail  Toileting assist Assist level: Touching or steadying assistance (Pt.75%)   Transfers Chair/bed transfer Chair/bed transfer activity did not occur: Safety/medical concerns Chair/bed transfer method: Stand pivot Chair/bed transfer assist level: Touching or steadying assistance (Pt > 75%) Chair/bed transfer assistive device: Armrests     Locomotion Ambulation     Max distance: 4360ft Assist level: Supervision or verbal cues   Wheelchair   Type: Manual Max wheelchair distance: 12450ft Assist Level: Supervision or verbal cues  Cognition Comprehension Comprehension assist level: Understands basic 50 - 74% of the time/ requires cueing 25 - 49% of the time  Expression Expression assist level: Expresses basic 25 - 49% of the time/requires cueing 50 - 75% of the time. Uses single words/gestures.  Social Interaction Social Interaction assist level: Interacts appropriately 75 - 89% of the time - Needs redirection for appropriate language or to initiate interaction.  Problem Solving Problem solving assist level: Solves basic 50 - 74% of the time/requires cueing 25 - 49% of the time  Memory Memory assist level: Recognizes or recalls 90% of the time/requires cueing < 10% of the time    Medical Problem List and Plan: 1. Debilitation secondary to  respiratory failure/MRSA pneumonia.   Contact precautions. All antibiotic therapy is completed.   -Cont CIR  Taper of by mouth prednisone over 2 weeks, began on 6/1 2. DVT Prophylaxis/Anticoagulation: Subcutaneous heparin. Monitor for any bleeding episodes 3. Pain Management: Tylenol as needed 4. Mood/severe autism: Klonopin 0.5 mg daily at bedtime, Zyprexa 5 mg twice a day. Patient appears to be at his baseline per family 5. Neuropsych: This patient is not capable of making decisions on his own behalf. 6. Skin/Wound Care: Routine skin checks 7. Fluids/Electrolytes/Nutrition: Routine I&O's 8. Dysphagia. Advanced to regular diet on 5/31.   Monitor for any signs of aspiration 9. Acute on chronic anemia. Continue iron supplement.   Hb 9.7 on 6/2 10. History of sickle cell trait. 11. Leukocytosis  Possibly secondary to steroids  WBCs 21.2 on 6/3  Will cont to monitor  CXR on 6/2 reviewed, likely atelectasis  UA neg, Ucx on 6/2 pending  LOS (Days) 4 A FACE TO FACE EVALUATION WAS PERFORMED  Ankit Karis Jubanil Patel 06/14/2015 2:42 PM

## 2015-06-14 NOTE — Progress Notes (Signed)
Physical Therapy Session Note  Patient Details  Name: Juan Cameron MRN: [id number] Date of Birth: 08-23-1975  Today's Date: 06/14/2015 PT Individual Time: 0915-1000 PT Individual Time Calculation (min): 45 min   Short Term Goals: Week 1:  PT Short Term Goal 1 (Week 1): Pt will negotiate 4 steps with 1 rail with steady A. PT Short Term Goal 2 (Week 1): Pt will ambulate 50 ft with LRAD & Min A.  PT Short Term Goal 3 (Week 1): Pt will tolerate 15 consecutive minutes of therapy while maintaining >90% SpO2.  Skilled Therapeutic Interventions/Progress Updates:    [facility] and [facility] to PT with encouragement from PT.  Patient performed WC mobility for 75x 4, 10360ft x 2 with supervision A from PT with BLE propulsion technique by choice. PT encouraged patient to utilize BUE for improved ease of locomotion without success. Patient reported increased fatigue in BLE quadriceps at end of session following last bout of WC mobility in controlled environment. Patient also performed WC mobility in simulated community environment including cement sidewalk with BLE propulsion with for 75 ft and 6350ft with supervision A from PT with min cues for obstacle navigation and to prevent WC veering L due to slope of sidewalk.   Patient performed gait training for 1460ft with supervision A with RW and 4130ft with supervision A with RW with 3 minute rest break between bouts. Mild trendelenburg noted with gait training on this day as well as decreased step length bilaterally. PT encouraged patient to attempt increased distance without success.   Patient returned to room and left with call bell within reach.    PT attempted to see patient for second session and found patient asleep in bed. Patient aroused easily but reported that he could not do any exercises because he was "sick and cold". PT assessed vitals BP 131/88, HR 92bpm, SpO2 100% on 3 L/min via nasal canula, and oral temp 97.6 degrees.  Patient  left supine in bed with call bell within reach.   Therapy Documentation Precautions:  Precautions Precautions: Fall Precaution Comments: 3L/min supplemental oxygen via nasal cannula, catheter Restrictions Weight Bearing Restrictions: No General:   Missed time: 60 minutes due to patient fatigue and patient reporting that he did not feel well. Vital Signs: Therapy Vitals Temp: 97.9 F (36.6 C) Temp Source: Oral Pulse Rate: (!) 112 Resp: 18 BP: (!) 145/91 mmHg Patient Position (if appropriate): Sitting Oxygen Therapy SpO2: 100 % O2 Device: Nasal Cannula O2 Flow Rate (L/min): 3 L/min      See Function Navigator for Current Functional Status.   Therapy/Group: Individual Therapy  Golden Popustin E Serine Kea 06/14/2015, 3:17 PM

## 2015-06-14 NOTE — Progress Notes (Signed)
Occupational Therapy Session Note  Patient Details  Name: Juan RipperRobert Cameron MRN: [id number] Date of Birth: [birth year]  Today's Date: 06/14/2015 OT Individual Time: 1400-1440 OT Individual Time Calculation (min): 40 min patient missed 5 minutes due to complaints of being very cold and not feeling well    Short Term Goals: Week 1:  OT Short Term Goal 1 (Week 1): Pt will engage in 10 minutes of functional tasks with 2 or less rest breaks. OT Short Term Goal 2 (Week 1): Pt will perform LB dressing with min A in order to increase I in self care. OT Short Term Goal 3 (Week 1): Pt willl perform clothing managemenmt with contact guard for balance after toileting in order to increase balance with functional tasks.  Skilled Therapeutic Interventions/Progress Updates: Initiall patient participated in therapy to work on endurance activities seated, but then looked lethargic and stated she did not feel well.   Nursing took his vital signs which were usual typical high heart rate and blood pressure.  Temp was 97.5 ish.    Patient reportedly has had UTI.   At end of session, patient was asissted w/c to bed with close S for stand pivot transfer to rest before his next therapy.      Therapy Documentation Precautions:  Precautions Precautions: Fall Precaution Comments: 3L/min supplemental oxygen via nasal cannula, catheter Restrictions Weight Bearing Restrictions: No  Pain:denied   See Function Navigator for Current Functional Status.   Therapy/Group: Individual Therapy  Bud Faceickett, Jawaan Adachi [facility]Yeary 06/14/2015, 7:12 PM

## 2015-06-15 ENCOUNTER — Inpatient Hospital Stay (HOSPITAL_COMMUNITY): Payer: Medicare Other | Admitting: Physical Therapy

## 2015-06-15 LAB — CBC WITH DIFFERENTIAL/PLATELET
BASOS ABS: 0 10*3/uL (ref 0.0–0.1)
Basophils Relative: 0 %
EOS ABS: 0.7 10*3/uL (ref 0.0–0.7)
EOS PCT: 4 %
HCT: 26.5 % — ABNORMAL LOW (ref 39.0–52.0)
Hemoglobin: 8.5 g/dL — ABNORMAL LOW (ref 13.0–17.0)
LYMPHS ABS: 3.6 10*3/uL (ref 0.7–4.0)
LYMPHS PCT: 19 %
MCH: 26.8 pg (ref 26.0–34.0)
MCHC: 32.1 g/dL (ref 30.0–36.0)
MCV: 83.6 fL (ref 78.0–100.0)
MONO ABS: 1.7 10*3/uL — AB (ref 0.1–1.0)
Monocytes Relative: 9 %
Neutro Abs: 12.8 10*3/uL — ABNORMAL HIGH (ref 1.7–7.7)
Neutrophils Relative %: 68 %
PLATELETS: 225 10*3/uL (ref 150–400)
RBC: 3.17 MIL/uL — AB (ref 4.22–5.81)
RDW: 17.6 % — AB (ref 11.5–15.5)
WBC: 18.8 10*3/uL — AB (ref 4.0–10.5)

## 2015-06-15 LAB — URINE CULTURE

## 2015-06-15 NOTE — Progress Notes (Signed)
Physical Therapy Session Note  Patient Details  Name: Juan Cameron MRN: [id number] Date of Birth: [birth year]  Today's Date: 06/15/2015 PT Individual Time: 1416-1500 PT Individual Time Calculation (min): 44 min   Short Term Goals: Week 1:  PT Short Term Goal 1 (Week 1): Pt will negotiate 4 steps with 1 rail with steady A. PT Short Term Goal 2 (Week 1): Pt will ambulate 50 ft with LRAD & Min A.  PT Short Term Goal 3 (Week 1): Pt will tolerate 15 consecutive minutes of therapy while maintaining >90% SpO2.  Skilled Therapeutic Interventions/Progress Updates:    Pt received in w/c denying c/o pain with sister present for half of PT session. Educated pt's sister, [name], on recommendation for 24 hr assistance following d/c & she voiced understanding & reported she plans to have this lined up. Pt's SpO2 = 100% & HR = 110 bpm at rest. Pt self propelled w/c x 25 ft with BUE requiring Mod A for turns & fatiguing quickly. Gait training x 120 + 120 ft with RW & Steady A. Pt with decreased step length BLE & decreased gait speed. After gait training HR = 135 bpm & SpO2 = 96% (pt's sister wished for pt be on 4L/min during ambulation to prevent drop in SpO2). Educated pt on pursed lip breathing but pt with poor demonstration, reporting "my nose hurts". PT notified RN of pt's elevated HR at rest & with activity as well as c/o nose pain. During session, discussed treatment plans & various exercises pt has performed. Educated pt's sister on pt's poor endurance, strength & balance. Pt's sister reports rails cannot be installed at home.  Back in room pt's sister no longer present for remainder of session & pt reported need to use restroom. Pt able to tolerate standing ~2 minutes with Min A for balance to utilize urinal; pt initially missed urinal & cleaned up pt & floor. Pt able to doff soiled shorts & don clean ones with sit<>stand with steady A. At end of session pt left in w/c with QRB in place, on 3L/min supplemental  oxygen via nasal cannula & all needs within reach.  Therapy Documentation Precautions:  Precautions Precautions: Fall Precaution Comments: 3L/min supplemental oxygen via nasal cannula, catheter Restrictions Weight Bearing Restrictions: No   See Function Navigator for Current Functional Status.   Therapy/Group: Individual Therapy  Sandi MariscalVictoria M Miller 06/15/2015, 12:39 PM

## 2015-06-15 NOTE — Progress Notes (Signed)
[facility] PHYSICAL MEDICINE & REHABILITATION     PROGRESS NOTE  Subjective/Complaints:  Patient seen sleeping in bed morning. His nasal cannula is no noted to be on his forehead. He j states he just wants to sleep.    ROS: Appears to denies CP, SOB, nausea, vomiting, diarrhea, however limited communication.  Objective: Vital Signs: Blood pressure 128/53, pulse 118, temperature 98.1 F (36.7 C), temperature source Oral, resp. rate 18, weight 88.4 kg (194 lb 14.2 oz), SpO2 99 %. Dg Chest Port 1 View  06/13/2015  CLINICAL DATA:  Leukocytosis EXAM: PORTABLE CHEST 1 VIEW COMPARISON:  06/07/2015 FINDINGS: Low lung volumes. Mild bibasilar opacities, favored to reflect atelectasis. No pleural effusion or pneumothorax. The heart is normal size. Right arm PICC terminates the cavoatrial junction. IMPRESSION: Low lung volumes with bibasilar opacities, favored to reflect atelectasis. Electronically Signed   By: Charline BillsSriyesh  Krishnan M.D.   On: 06/13/2015 15:14    Recent Labs  06/14/15 0442 06/15/15 0330  WBC 21.2* 18.8*  HGB 8.8* 8.5*  HCT 28.1* 26.5*  PLT 278 225   No results for input(s): NA, K, CL, GLUCOSE, BUN, CREATININE, CALCIUM in the last 72 hours.  Invalid input(s): CO CBG (last 3)  No results for input(s): GLUCAP in the last 72 hours.  Wt Readings from Last 3 Encounters:  06/15/15 88.4 kg (194 lb 14.2 oz)  06/10/15 84.1 kg (185 lb 6.5 oz)  05/16/15 91.536 kg (201 lb 12.8 oz)    Physical Exam:  BP 128/53 mmHg  Pulse 118  Temp(Src) 98.1 F (36.7 C) (Oral)  Resp 18  Wt 88.4 kg (194 lb 14.2 oz)  SpO2 99% Gen: NAD. Well-developed, well-nourished HENT: trach site healed. Normocephalic.  Eyes: Conjunctivae and EOM are normal.  Cardiovascular: Normal rate and regular rhythm  Respiratory: Effort normal. No respiratory distress GI: Soft. Bowel sounds are normal. He exhibits no distension Neurological. Patient was alert. Followed simple commands occasionally. Moving all 4  extremities without difficulty, likely 4/5 b/l UE.   Skin. Intact, no breakdown Psych: pt very flat, however, animated and engaged depending on person  Assessment/Plan: 1. Functional deficits secondary to respiratory failure/MRSA pneumonia which require 3+ hours per day of interdisciplinary therapy in a comprehensive inpatient rehab setting. Physiatrist is providing close team supervision and 24 hour management of active medical problems listed below. Physiatrist and rehab team continue to assess barriers to discharge/monitor patient progress toward functional and medical goals.  Function:  Bathing Bathing position   Position: Wheelchair/chair at sink  Bathing parts Body parts bathed by patient: Right arm, Left arm, Abdomen, Chest, Front perineal area, Right upper leg, Left upper leg, Right lower leg, Left lower leg, Buttocks Body parts bathed by helper: Back  Bathing assist Assist Level: Touching or steadying assistance(Pt > 75%) (CGA for standing balance)   Set up : To obtain items  Upper Body Dressing/Undressing Upper body dressing   What is the patient wearing?: Pull over shirt/dress     Pull over shirt/dress - Perfomed by patient: Thread/unthread right sleeve, Thread/unthread left sleeve, Put head through opening, Pull shirt over trunk          Upper body assist Assist Level: Set up   Set up : To obtain clothing/put away  Lower Body Dressing/Undressing Lower body dressing   What is the patient wearing?: Pants, Non-skid slipper socks Underwear - Performed by patient: Thread/unthread right underwear leg, Thread/unthread left underwear leg, Pull underwear up/down   Pants- Performed by patient: Thread/unthread right pants leg,  Thread/unthread left pants leg, Pull pants up/down, Fasten/unfasten pants   Non-skid slipper socks- Performed by patient: Don/doff left sock, Don/doff right sock       Shoes - Performed by patient: Don/doff right shoe, Don/doff left shoe Shoes -  Performed by helper: Fasten right, Fasten left       TED Hose - Performed by helper: Don/doff right TED hose, Don/doff left TED hose  Lower body assist Assist for lower body dressing: Set up, Supervision or verbal cues   Set up : To obtain clothing/put away  Toileting Toileting Toileting activity did not occur: Safety/medical concerns Toileting steps completed by patient: Adjust clothing prior to toileting, Performs perineal hygiene, Adjust clothing after toileting Toileting steps completed by helper: Adjust clothing prior to toileting, Performs perineal hygiene, Adjust clothing after toileting Toileting Assistive Devices: Grab bar or rail  Toileting assist Assist level: Touching or steadying assistance (Pt.75%)   Transfers Chair/bed transfer Chair/bed transfer activity did not occur: Safety/medical concerns Chair/bed transfer method: Stand pivot Chair/bed transfer assist level: Supervision or verbal cues Chair/bed transfer assistive device: Patent attorney     Max distance: 49ft Assist level: Supervision or verbal cues   Wheelchair   Type: Manual Max wheelchair distance: 8ft Assist Level: Supervision or verbal cues  Cognition Comprehension Comprehension assist level: Understands basic 50 - 74% of the time/ requires cueing 25 - 49% of the time  Expression Expression assist level: Expresses basic 25 - 49% of the time/requires cueing 50 - 75% of the time. Uses single words/gestures.  Social Interaction Social Interaction assist level: Interacts appropriately 75 - 89% of the time - Needs redirection for appropriate language or to initiate interaction.  Problem Solving Problem solving assist level: Solves basic 50 - 74% of the time/requires cueing 25 - 49% of the time  Memory Memory assist level: Recognizes or recalls 90% of the time/requires cueing < 10% of the time    Medical Problem List and Plan: 1. Debilitation secondary to respiratory failure/MRSA  pneumonia.   Contact precautions. All antibiotic therapy is completed.   -Cont CIR  Taper of by mouth prednisone over 2 weeks, began on 6/1 2. DVT Prophylaxis/Anticoagulation: Subcutaneous heparin. Monitor for any bleeding episodes 3. Pain Management: Tylenol as needed 4. Mood/severe autism: Klonopin 0.5 mg daily at bedtime, Zyprexa 5 mg twice a day. Patient appears to be at his baseline per family 5. Neuropsych: This patient is not capable of making decisions on his own behalf. 6. Skin/Wound Care: Routine skin checks 7. Fluids/Electrolytes/Nutrition: Routine I&O's 8. Dysphagia. Advanced to regular diet on 5/31.   Monitor for any signs of aspiration 9. Acute on chronic anemia. Continue iron supplement.   Hb 8.5 on 6/4  Will Hemoccult stools 10. History of sickle cell trait. 11. Leukocytosis  Possibly secondary to steroids  WBCs 18.8 on 6/4 (slightly down from yesterday), will continue to monitor in accordance with steroid taper  Will cont to monitor  CXR on 6/2 reviewed, likely atelectasis  UA neg, Ucx on 6/2 remains pending  LOS (Days) 5 A FACE TO FACE EVALUATION WAS PERFORMED  Juan Cameron 06/15/2015 8:00 AM

## 2015-06-16 ENCOUNTER — Inpatient Hospital Stay (HOSPITAL_COMMUNITY): Payer: Medicare Other | Admitting: Occupational Therapy

## 2015-06-16 ENCOUNTER — Inpatient Hospital Stay (HOSPITAL_COMMUNITY): Payer: Self-pay | Admitting: Speech Pathology

## 2015-06-16 ENCOUNTER — Inpatient Hospital Stay (HOSPITAL_COMMUNITY): Payer: Self-pay | Admitting: Occupational Therapy

## 2015-06-16 ENCOUNTER — Inpatient Hospital Stay (HOSPITAL_COMMUNITY): Payer: Medicare Other | Admitting: Physical Therapy

## 2015-06-16 NOTE — Progress Notes (Signed)
Speech Language Pathology Daily Session Note  Patient Details  Name: Juan Cameron MRN: [id number] Date of Birth: 24-Jul-1975  Today's Date: 06/16/2015 SLP Individual Time: 1400-1500 SLP Individual Time Calculation (min): 60 min  Short Term Goals: Week 1: SLP Short Term Goal 1 (Week 1): Pt will consume regular textures and thin liquids with mod I use of safe swallowing strategies.   SLP Short Term Goal 1 - Progress (Week 1): Met SLP Short Term Goal 2 (Week 1): Pt will name 2 compensatory swallow strategies with Mod I support.  SLP Short Term Goal 3 (Week 1): Pt to demonstrate sustained phonation with easy onset for 3 seconds with mod A. SLP Short Term Goal 4 (Week 1): Pt to demonstrate inhalation over a period of 2 seconds with mod A.  Skilled Therapeutic Interventions: Pt seen for skilled SLP therapy focusing on breath support and quality of voicing. Pt required mod verbal cueing, modeling and increased time to facilitate pt participation with therapeutic activities. Pt able to demonstrate improvement with deep breathing with average inhalation during targeted task equaling 2 seconds . Pt able to to demonstrate improved sustained phonation in conjunction with deep breathing task. Able to consistently demonstrate sustained phonation between 2.5 and 3 seconds. Vocal quality remains hoarse/harsh, but intelligibility improving.    Function:  Eating Eating                 Cognition Comprehension Comprehension assist level: Understands basic 75 - 89% of the time/ requires cueing 10 - 24% of the time  Expression   Expression assist level: Expresses basic 50 - 74% of the time/requires cueing 25 - 49% of the time. Needs to repeat parts of sentences.  Social Interaction Social Interaction assist level: Interacts appropriately 75 - 89% of the time - Needs redirection for appropriate language or to initiate interaction.  Problem Solving Problem solving assist level: Solves basic 50 - 74% of the  time/requires cueing 25 - 49% of the time  Memory Memory assist level: Recognizes or recalls 75 - 89% of the time/requires cueing 10 - 24% of the time    Pain Pain Assessment Pain Assessment: No/denies pain  Therapy/Group: Individual Therapy  [name] MA, CCC-SLP 06/16/2015, 3:33 PM

## 2015-06-16 NOTE — Progress Notes (Signed)
[place] PHYSICAL MEDICINE & REHABILITATION     PROGRESS NOTE  Subjective/Complaints:  Pt sitting up in bed this AM.  When he sees me approaching, he shakes his head and says "damn".    ROS: Appears to denies CP, SOB, nausea, vomiting, diarrhea, however limited communication.  Objective: Vital Signs: Blood pressure 117/85, pulse 122, temperature 98.7 F (37.1 C), temperature source Oral, resp. rate 18, weight 87.6 kg (193 lb 2 oz), SpO2 100 %. No results found.  Recent Labs  06/14/15 0442 06/15/15 0330  WBC 21.2* 18.8*  HGB 8.8* 8.5*  HCT 28.1* 26.5*  PLT 278 225   No results for input(s): NA, K, CL, GLUCOSE, BUN, CREATININE, CALCIUM in the last 72 hours.  Invalid input(s): CO CBG (last 3)  No results for input(s): GLUCAP in the last 72 hours.  Wt Readings from Last 3 Encounters:  06/16/15 87.6 kg (193 lb 2 oz)  06/10/15 84.1 kg (185 lb 6.5 oz)  05/16/15 91.536 kg (201 lb 12.8 oz)    Physical Exam:  BP 117/85 mmHg  Pulse 122  Temp(Src) 98.7 F (37.1 C) (Oral)  Resp 18  Wt 87.6 kg (193 lb 2 oz)  SpO2 100% Gen: NAD. Well-developed, well-nourished HENT: trach site healed. Normocephalic.  Eyes: Conjunctivae and EOM are normal.  Cardiovascular: Normal rate and regular rhythm  Respiratory: Effort normal. No respiratory distress GI: Soft. Bowel sounds are normal. He exhibits no distension Neurological. Patient was alert. Followed simple commands occasionally. Moving all 4 extremities without difficulty, likely 4/5 b/l UE.   Skin. Intact, no breakdown Psych: pt very flat, however, animated and engaged depending on person  Assessment/Plan: 1. Functional deficits secondary to respiratory failure/MRSA pneumonia which require 3+ hours per day of interdisciplinary therapy in a comprehensive inpatient rehab setting. Physiatrist is providing close team supervision and 24 hour management of active medical problems listed below. Physiatrist and rehab team continue to  assess barriers to discharge/monitor patient progress toward functional and medical goals.  Function:  Bathing Bathing position   Position: Wheelchair/chair at sink  Bathing parts Body parts bathed by patient: Right arm, Left arm, Abdomen, Chest, Front perineal area, Right upper leg, Left upper leg, Right lower leg, Left lower leg, Buttocks Body parts bathed by helper: Back  Bathing assist Assist Level: Touching or steadying assistance(Pt > 75%) (CGA for standing balance)   Set up : To obtain items  Upper Body Dressing/Undressing Upper body dressing   What is the patient wearing?: Pull over shirt/dress     Pull over shirt/dress - Perfomed by patient: Thread/unthread right sleeve, Thread/unthread left sleeve, Put head through opening, Pull shirt over trunk          Upper body assist Assist Level: Set up   Set up : To obtain clothing/put away  Lower Body Dressing/Undressing Lower body dressing   What is the patient wearing?: Pants, Non-skid slipper socks Underwear - Performed by patient: Thread/unthread right underwear leg, Thread/unthread left underwear leg, Pull underwear up/down   Pants- Performed by patient: Thread/unthread right pants leg, Thread/unthread left pants leg, Pull pants up/down, Fasten/unfasten pants   Non-skid slipper socks- Performed by patient: Don/doff left sock, Don/doff right sock       Shoes - Performed by patient: Don/doff right shoe, Don/doff left shoe Shoes - Performed by helper: Fasten right, Fasten left       TED Hose - Performed by helper: Don/doff right TED hose, Don/doff left TED hose  Lower body assist Assist for lower body  dressing: Set up, Supervision or verbal cues   Set up : To obtain clothing/put away  Toileting Toileting Toileting activity did not occur: Safety/medical concerns Toileting steps completed by patient: Adjust clothing prior to toileting, Performs perineal hygiene, Adjust clothing after toileting Toileting steps  completed by helper: Adjust clothing prior to toileting, Performs perineal hygiene, Adjust clothing after toileting Toileting Assistive Devices: Grab bar or rail  Toileting assist Assist level: Touching or steadying assistance (Pt.75%)   Transfers Chair/bed transfer Chair/bed transfer activity did not occur: Safety/medical concerns Chair/bed transfer method: Stand pivot Chair/bed transfer assist level: Supervision or verbal cues Chair/bed transfer assistive device: Patent attorneyWalker     Locomotion Ambulation     Max distance: 120 ft  Assist level: Touching or steadying assistance (Pt > 75%)   Wheelchair   Type: Manual Max wheelchair distance: 6475ft Assist Level: Supervision or verbal cues  Cognition Comprehension Comprehension assist level: Understands basic 50 - 74% of the time/ requires cueing 25 - 49% of the time  Expression Expression assist level: Expresses basic 25 - 49% of the time/requires cueing 50 - 75% of the time. Uses single words/gestures.  Social Interaction Social Interaction assist level: Interacts appropriately 75 - 89% of the time - Needs redirection for appropriate language or to initiate interaction.  Problem Solving Problem solving assist level: Solves basic 50 - 74% of the time/requires cueing 25 - 49% of the time  Memory Memory assist level: Recognizes or recalls 90% of the time/requires cueing < 10% of the time    Medical Problem List and Plan: 1. Debilitation secondary to respiratory failure/MRSA pneumonia.   Contact precautions. All antibiotic therapy is completed.   -Cont CIR  Taper of by mouth prednisone over 2 weeks, began on 6/1 2. DVT Prophylaxis/Anticoagulation: Subcutaneous heparin. Monitor for any bleeding episodes 3. Pain Management: Tylenol as needed 4. Mood/severe autism: Klonopin 0.5 mg daily at bedtime, Zyprexa 5 mg twice a day. Patient appears to be at his baseline per family 5. Neuropsych: This patient is not capable of making decisions on his own  behalf. 6. Skin/Wound Care: Routine skin checks 7. Fluids/Electrolytes/Nutrition: Routine I&O's 8. Dysphagia. Advanced to regular diet on 5/31.   Monitor for any signs of aspiration 9. Acute on chronic anemia. Continue iron supplement.   Hb 8.5 on 6/4  Hemoccult stools pending  Labs pending 10. History of sickle cell trait. 11. Leukocytosis  Possibly secondary to steroids  WBCs 18.8 on 6/4 (slightly down from yesterday), will continue to monitor in accordance with steroid taper  Labs pending  CXR on 6/2 reviewed, likely atelectasis  UA neg, Ucx on 6/2 with multiple species, reordered  LOS (Days) 6 A FACE TO FACE EVALUATION WAS PERFORMED  Ankit Karis Jubanil Patel 06/16/2015 8:36 AM

## 2015-06-16 NOTE — Progress Notes (Signed)
Patients PICC line dressing has not been changed in a long time.  Patient has been refusing to have it changed.  Patient is not getting anything through his PICC, therefore we no longer need it.  Notified Deatra Inaan Angiulli, PA of recommendations from IV team to remove catheter.  New order obtained.  Primary RN at bedside during removal, patient tolerated well although he was scared.  Will continue to monitor.  Dani Gobbleeardon, Draper Gallon J, RN

## 2015-06-16 NOTE — Progress Notes (Addendum)
Physical Therapy Session Note  Patient Details  Name: Juan Cameron MRN: [id number] Date of Birth: 15-May-1975  Today's Date: 06/16/2015 PT Individual Time: 0802-0900 and 4098-1191 PT Individual Time Calculation (min): 58 min and 60 min  Short Term Goals: Week 1:  PT Short Term Goal 1 (Week 1): Pt will negotiate 4 steps with 1 rail with steady A. PT Short Term Goal 2 (Week 1): Pt will ambulate 50 ft with LRAD & Min A.  PT Short Term Goal 3 (Week 1): Pt will tolerate 15 consecutive minutes of therapy while maintaining >90% SpO2.  Skilled Therapeutic Interventions/Progress Updates:    Pt received sitting on EOB agreeable to PT, denying c/o pain. Pt's HR = 105 bpm & SpO2 = 100% at rest on 3L/min supplemental oxygen via nasal cannula.  Pt completed stand pivot bed>w/c with steady A & cuing to turn buttocks towards chair. Pt self propelled w/c x 15 ft with BUE but limited due to R UE pain (IV insertion site). In gym pt negotiated 4 steps with B rails x 2 trials with standing rest break in between. Pt required steady A/supervision for stair negotiation task with reciprocal pattern. SpO2 = 91% (increased to 94% in 15 seconds) & HR = 124 bpm after task on 3L/min supplemental oxygen. Utilized nu-step without BUE support x 12 minutes on Level 3 for BLE strengthening & endurance training. Pt noted intermittent L knee pain during activity & required encouragement to continue to participate in task. Pt self adjusted arm rests & level of intensity without cuing from PT; PT educated pt on need to complete task in manner instructed for maximum benefit. Utilized Runner, broadcasting/film/video for BLE strengthening with resistance set at 50-60 cm/sec. Throughout session pt required maximum cuing for safety with transfers, including turning to square up to seat & reach back with both hands before transferring stand>sit. Pt nonverbal throughout entire session even with maximum encouragement for conversational engagement from PT. At end of  session pt returned to room & left in w/c with QRB in place & all needs within reach.   Addendum Treatment 2: Pt seen for 60 minutes to make up missed time on 06/14/15. Pt received in bed, noting R UE pain after removal of IV. RN notified & administered pain medication during session. Pt transferred supine>sit with mod I & use of bed features & independently donned shoes sitting EOB. Pt reported he did not want to walk, as he was very tired. Pt completed stand pivot bed>w/c without AD & with supervision A; transported pt room>gym via w/c total A for energy conservation. Engaged pt in Wii bowling to address endurance, standing/activity tolerance, and balance. Pt able to stand 7 minutes 12 seconds + 8 minutes 42 seconds. Pt returned to room via w/c total A but ambulated 10 ft x 2 to/from bathroom with RW & supervision. Pt able to complete toilet transfers from low toilet with use of grab bars & supervision, (+) void. Pt reported "I don't need it" referring to using walker while ambulating & PT educated pt on need to use RW at all times for safety & to prevent falls. Pt performed hand hygiene while standing at sink then returned to supine in bed. Pt's SpO2 = 97% increasing to 100% & HR = 127 bpm at end of session on 3L/min supplemental oxygen via nasal cannula. Pt left in bed with all needs within reach & bed alarm set.   Therapy Documentation Precautions:  Precautions Precautions: Fall Precaution Comments: 3L/min supplemental oxygen  via nasal cannula, catheter Restrictions Weight Bearing Restrictions: No  Pain: Pain Assessment Pain Assessment: No/denies pain   See Function Navigator for Current Functional Status.   Therapy/Group: Individual Therapy  Juan Cameron 06/16/2015, 7:56 AM

## 2015-06-16 NOTE — Progress Notes (Signed)
Occupational Therapy Session Note  Patient Details  Name: Juan RipperRobert Kisling MRN: [id number] Date of Birth: 1975/08/03  Today's Date: 06/16/2015 OT Individual Time: 1010-1055 OT Individual Time Calculation (min): 45 min   Short Term Goals: Week 1:  OT Short Term Goal 1 (Week 1): Pt will engage in 10 minutes of functional tasks with 2 or less rest breaks. OT Short Term Goal 2 (Week 1): Pt will perform LB dressing with min A in order to increase I in self care. OT Short Term Goal 3 (Week 1): Pt willl perform clothing managemenmt with contact guard for balance after toileting in order to increase balance with functional tasks.  Skilled Therapeutic Interventions/Progress Updates:  Patient found seated in w/c with no complaints of pain. Pt refused shower even with max encouragement from therapist. Therapist asked pt why he didn't want to take a shower and [facility], therapist explained that this could be covered up. Discussed this with RN and asked her to talk with him about showering. Therapist assisted pt to sink and pt completed UB/LB bathing & dressing in sit to/from stand position. Pt did much better this session with initiation and completing tasks thoroughly. The only thing pt asked for help with was washing his back. Therapist donned bilateral TEDs, but pt able to complete rest of ADL with supervision. Pt initiated brushing his teeth on his own. Towards end of session IV RN present to change PIC line dressing, but stated "no". Therapist explained that the dressing needed to be changed and that patient needed to lay down in bed for it to be completed. Pt continued to state "no". Therapist took opportunity to talk with patient's RN regarding this, as pt and RN have good rapport. At end of session, left pt seated in w/c with all needs within reach and quick release belt donned. Pt on 3L/min supplemental 02 via [place] entire session  Therapy Documentation Precautions:   Precautions Precautions: Fall Precaution Comments: 3L/min supplemental oxygen via nasal cannula Restrictions Weight Bearing Restrictions: No  Vital Signs: Therapy Vitals Temp: 98.7 F (37.1 C) Temp Source: Oral Pulse Rate: (!) 122 Resp: 18 BP: 117/85 mmHg Patient Position (if appropriate): Lying Oxygen Therapy SpO2: 100 % O2 Device: Nasal Cannula O2 Flow Rate (L/min): 3 L/min  See Function Navigator for Current Functional Status.  Therapy/Group: Individual Therapy  Edwin CapPatricia Omaira Mellen , MS, OTR/L, CLT  06/16/2015, 11:26 AM

## 2015-06-16 NOTE — Progress Notes (Signed)
RT called to pts bedside for possible wheezing. Assessment indicates no indication for neb tx at this time. RN aware.

## 2015-06-16 NOTE — Progress Notes (Signed)
Occupational Therapy Session Note  Patient Details  Name: Alan RipperRobert Lasota MRN: [id number] Date of Birth: 1976/01/02  Today's Date: 06/16/2015 OT Individual Time: 1300-1329 OT Individual Time Calculation (min): 29 min    Skilled Therapeutic Interventions/Progress Updates:    Pt with limited participation during session.  Constantly shaking head "no" when asked to participate.  Pt also reporting that he is "sick".  Reluctantly had him agree to complete sit to stand and standing balance with min guard assist for 2 mins before resting.  Oxygen sats at 98% with HR at 111.  He would not attempt standing after that one attempt.  Tried to engage him in UE exercises using weighted ball with pt only performing one set of shoulder flexion for each UE for 5 reps.  Finished session with 10 repetitions using incentive spirometer.  Pt needing max demonstrational cueing to use correctly and did not want to pay attention to the therapist's attempts to demonstrate correct use.  Pt left in wheelchair with call button in reach and safety belt in place.   Therapy Documentation Precautions:  Precautions Precautions: Fall Precaution Comments: 3L/min supplemental oxygen via nasal cannula Restrictions Weight Bearing Restrictions: No  Vital Signs: Therapy Vitals Temp: 98 F (36.7 C) Temp Source: Oral Pulse Rate: (!) 115 Resp: 18 BP: (!) 141/82 mmHg Patient Position (if appropriate): Sitting Oxygen Therapy SpO2: 98 % O2 Device: Nasal Cannula O2 Flow Rate (L/min): 3 L/min Pulse Oximetry Type: Intermittent Pain: Pain Assessment Pain Assessment: No/denies pain  See Function Navigator for Current Functional Status.   Therapy/Group: Individual Therapy  Blakelynn Scheeler OTR/L 06/16/2015, 3:39 PM

## 2015-06-17 ENCOUNTER — Inpatient Hospital Stay (HOSPITAL_COMMUNITY): Payer: Medicare Other | Admitting: Physical Therapy

## 2015-06-17 ENCOUNTER — Inpatient Hospital Stay (HOSPITAL_COMMUNITY): Payer: Self-pay | Admitting: Physical Therapy

## 2015-06-17 ENCOUNTER — Inpatient Hospital Stay (HOSPITAL_COMMUNITY): Payer: Medicare Other | Admitting: Speech Pathology

## 2015-06-17 ENCOUNTER — Inpatient Hospital Stay (HOSPITAL_COMMUNITY): Payer: Medicare Other | Admitting: Occupational Therapy

## 2015-06-17 LAB — URINE CULTURE

## 2015-06-17 LAB — CBC WITH DIFFERENTIAL/PLATELET
BASOS PCT: 0 %
Basophils Absolute: 0 10*3/uL (ref 0.0–0.1)
EOS PCT: 6 %
Eosinophils Absolute: 1 10*3/uL — ABNORMAL HIGH (ref 0.0–0.7)
HEMATOCRIT: 30.7 % — AB (ref 39.0–52.0)
Hemoglobin: 9.6 g/dL — ABNORMAL LOW (ref 13.0–17.0)
Lymphocytes Relative: 19 %
Lymphs Abs: 3.2 10*3/uL (ref 0.7–4.0)
MCH: 26 pg (ref 26.0–34.0)
MCHC: 31.3 g/dL (ref 30.0–36.0)
MCV: 83.2 fL (ref 78.0–100.0)
MONO ABS: 1.2 10*3/uL — AB (ref 0.1–1.0)
MONOS PCT: 7 %
NEUTROS ABS: 11.4 10*3/uL — AB (ref 1.7–7.7)
Neutrophils Relative %: 68 %
Platelets: 227 10*3/uL (ref 150–400)
RBC: 3.69 MIL/uL — ABNORMAL LOW (ref 4.22–5.81)
RDW: 17.4 % — AB (ref 11.5–15.5)
WBC: 16.8 10*3/uL — ABNORMAL HIGH (ref 4.0–10.5)

## 2015-06-17 NOTE — Plan of Care (Signed)
Problem: RH Bed to Chair Transfers Goal: LTG Patient will perform bed/chair transfers w/assist (PT) LTG: Patient will perform bed/chair transfers with assistance, with/without cues (PT).  Downgrade 2/2 pt progress & decreased safety awareness  Problem: RH Car Transfers Goal: LTG Patient will perform car transfers with assist (PT) LTG: Patient will perform car transfers with assistance (PT).  Downgrade 2/2 pt progress & decreased safety awareness

## 2015-06-17 NOTE — Progress Notes (Signed)
[place] PHYSICAL MEDICINE & REHABILITATION     PROGRESS NOTE  Subjective/Complaints:  Pt sitting at the edge of the bed eating breakfast.  He is enjoying the food.  He is willing to nod yes/no.    ROS: Appears to denies CP, SOB, nausea, vomiting, diarrhea, however limited communication.  Objective: Vital Signs: Blood pressure 130/76, pulse 107, temperature 98.4 F (36.9 C), temperature source Oral, resp. rate 19, weight 89.3 kg (196 lb 13.9 oz), SpO2 100 %. No results found.  Recent Labs  06/15/15 0330 06/17/15 0533  WBC 18.8* 16.8*  HGB 8.5* 9.6*  HCT 26.5* 30.7*  PLT 225 227   No results for input(s): NA, K, CL, GLUCOSE, BUN, CREATININE, CALCIUM in the last 72 hours.  Invalid input(s): CO CBG (last 3)  No results for input(s): GLUCAP in the last 72 hours.  Wt Readings from Last 3 Encounters:  06/17/15 89.3 kg (196 lb 13.9 oz)  06/10/15 84.1 kg (185 lb 6.5 oz)  05/16/15 91.536 kg (201 lb 12.8 oz)    Physical Exam:  BP 130/76 mmHg  Pulse 107  Temp(Src) 98.4 F (36.9 C) (Oral)  Resp 19  Wt 89.3 kg (196 lb 13.9 oz)  SpO2 100% Gen: NAD. Well-developed, well-nourished HENT: trach site healed. Normocephalic.  Eyes: Conjunctivae and EOM are normal.  Cardiovascular: Normal rate and regular rhythm  Respiratory: Effort normal. No respiratory distress GI: Soft. Bowel sounds are normal. He exhibits no distension Neurological. Patient was alert. Followed simple commands occasionally. Moving all 4 extremities without difficulty, likely 4/5 b/l UE, and 4/5 b/l LE (?effort).   Skin. Warm and dry. Psych: pt very flat, however, animated and engaged depending on person  Assessment/Plan: 1. Functional deficits secondary to respiratory failure/MRSA pneumonia which require 3+ hours per day of interdisciplinary therapy in a comprehensive inpatient rehab setting. Physiatrist is providing close team supervision and 24 hour management of active medical problems listed  below. Physiatrist and rehab team continue to assess barriers to discharge/monitor patient progress toward functional and medical goals.  Function:  Bathing Bathing position   Position: Wheelchair/chair at sink  Bathing parts Body parts bathed by patient: Right arm, Left arm, Abdomen, Chest, Front perineal area, Right upper leg, Left upper leg, Right lower leg, Left lower leg, Buttocks Body parts bathed by helper: Back  Bathing assist Assist Level: Supervision or verbal cues   Set up : To obtain items  Upper Body Dressing/Undressing Upper body dressing   What is the patient wearing?: Pull over shirt/dress     Pull over shirt/dress - Perfomed by patient: Thread/unthread right sleeve, Thread/unthread left sleeve, Put head through opening, Pull shirt over trunk          Upper body assist Assist Level: Set up   Set up : To obtain clothing/put away  Lower Body Dressing/Undressing Lower body dressing   What is the patient wearing?: Pants, American Family Insuranceed Hose, Shoes Underwear - Performed by patient: Thread/unthread right underwear leg, Thread/unthread left underwear leg, Pull underwear up/down   Pants- Performed by patient: Thread/unthread right pants leg, Thread/unthread left pants leg, Pull pants up/down, Fasten/unfasten pants   Non-skid slipper socks- Performed by patient: Don/doff left sock, Don/doff right sock       Shoes - Performed by patient: Don/doff right shoe, Don/doff left shoe, Fasten right, Fasten left Shoes - Performed by helper: Fasten right, Fasten left       TED Hose - Performed by helper: Don/doff right TED hose, Don/doff left TED hose  Lower  body assist Assist for lower body dressing: Set up, Supervision or verbal cues   Set up : To obtain clothing/put away  Toileting Toileting Toileting activity did not occur: N/A Toileting steps completed by patient: Adjust clothing prior to toileting, Performs perineal hygiene, Adjust clothing after toileting Toileting steps  completed by helper: Adjust clothing prior to toileting, Performs perineal hygiene, Adjust clothing after toileting Toileting Assistive Devices: Grab bar or rail  Toileting assist Assist level: Touching or steadying assistance (Pt.75%)   Transfers Chair/bed transfer Chair/bed transfer activity did not occur: N/A Chair/bed transfer method: Stand pivot Chair/bed transfer assist level: Supervision or verbal cues Chair/bed transfer assistive device: Patent attorney     Max distance: 120 ft  Assist level: Touching or steadying assistance (Pt > 75%)   Wheelchair   Type: Manual Max wheelchair distance: 40ft Assist Level: Supervision or verbal cues  Cognition Comprehension Comprehension assist level: Understands basic 75 - 89% of the time/ requires cueing 10 - 24% of the time  Expression Expression assist level: Expresses basic 50 - 74% of the time/requires cueing 25 - 49% of the time. Needs to repeat parts of sentences.  Social Interaction Social Interaction assist level: Interacts appropriately 75 - 89% of the time - Needs redirection for appropriate language or to initiate interaction.  Problem Solving Problem solving assist level: Solves basic 50 - 74% of the time/requires cueing 25 - 49% of the time  Memory Memory assist level: Recognizes or recalls 75 - 89% of the time/requires cueing 10 - 24% of the time    Medical Problem List and Plan: 1. Debilitation secondary to respiratory failure/MRSA pneumonia.   Contact precautions. All antibiotic therapy is completed.   -Cont CIR  Taper of by mouth prednisone over 2 weeks, began on 6/1 2. DVT Prophylaxis/Anticoagulation: Subcutaneous heparin. Monitor for any bleeding episodes 3. Pain Management: Tylenol as needed 4. Mood/severe autism: Klonopin 0.5 mg daily at bedtime, Zyprexa 5 mg twice a day. Patient appears to be at his baseline per family 5. Neuropsych: This patient is not capable of making decisions on his own  behalf. 6. Skin/Wound Care: Routine skin checks 7. Fluids/Electrolytes/Nutrition: Routine I&O's 8. Dysphagia. Advanced to regular diet on 5/31.   Monitor for any signs of aspiration 9. Acute on chronic anemia. Continue iron supplement.   Hb 9.6 on 6/6  Hemoccult stools remain pending 10. History of sickle cell trait. 11. Leukocytosis: Afebrile  Possibly secondary to steroids  WBCs 16.8 on 6/6 (now trending down), will continue to monitor in accordance with steroid taper  CXR on 6/2 reviewed, likely atelectasis  UA neg, Ucx on 6/2 with multiple species, reordered pending  LOS (Days) 7 A FACE TO FACE EVALUATION WAS PERFORMED  Ankit Karis Juba 06/17/2015 8:36 AM

## 2015-06-17 NOTE — Progress Notes (Signed)
Physical Therapy Session Note  Patient Details  Name: Juan Cameron MRN: [id number] Date of Birth: [birth year]  Today's Date: 06/17/2015 PT Individual Time: 0930-1002 and [id number] PT Individual Time Calculation (min): 32 min and 86 min  Short Term Goals: Week 1:  PT Short Term Goal 1 (Week 1): Pt will negotiate 4 steps with 1 rail with steady A. PT Short Term Goal 2 (Week 1): Pt will ambulate 50 ft with LRAD & Min A.  PT Short Term Goal 3 (Week 1): Pt will tolerate 15 consecutive minutes of therapy while maintaining >90% SpO2.  Skilled Therapeutic Interventions/Progress Updates:    Treatment 1: Pt received sitting up in bed & agreeable to PT. Pt reported his "nose hurts", potentially due to nasal cannula & PT educated pt on this. While sitting EOB pt able to don socks & shoes independently. Gait training x 200 ft + 120 ft with RW &  Supervision A with pt demonstrating reciprocal gait pattern. Pt able to correctly choose which way to turn to sit to avoid tangling oxygen tubing with questioning cuing from PT. Pt verbally stated very few words even with maximum PT encouragement to communicate with therapist. Throughout session PT continued pt on importance of inhaling through nose but pt did not demonstrate this. At end of session pt left on 3L/min supplemental oxygen via nasal cannula; SpO2 = 99% and HR = 122 bpm. Pt left in w/c with QRB donned & all needs within reach.  Treatment 2: Pt received in w/c & agreeable to PT. Educated pt on need for nasal cannula in layman's terms to increase pt's understanding & need for supplemental oxygen. Pt completed Berg balance test with multiple rest breaks 2/2 fatigue. Pt scored 36/56 on berg balance test; educated pt on score & need to use AD for ambulation. Patient demonstrates increased fall risk as noted by score of 36/56 on Berg Balance Scale.  (<36= high risk for falls, close to 100%; 37-45 significant >80%; 46-51 moderate >50%; 52-55 lower >25%). While  performing tasks for berg balance test pt required maximum encouragement to complete tasks as he would repetitively state "can't". Pt appears to have very flat affect, speaking very few words with therapist & poor motivation to participate. Discussed pt's medical status & motivation with PA & RN, as well as d/c plan with CSW.  In gym PT educated pt on stair negotiation with RW & pt able to negotiate 4 steps forwards with RW & +2 assist for safety. Pt completed stair negotiation x 1 additional trial with +1 Min A for safety. Pt requires someone to assist with RW stability to prevent pt from pushing AD forward; pt anxious with task. Reinforced need to use RW at all times as pt stated "don't need it". Pt also very resistant to pursed lip breathing due to c/o "my nose hurts" with inhalation through nose. At end of session pt returned to room & left in w/c with QRB in place & all needs within reach. SpO2 = 100% & HR = 122 bpm at end of session.     Therapy Documentation Precautions:  Precautions Precautions: Fall Precaution Comments: 3L/min supplemental oxygen via nasal cannula Restrictions Weight Bearing Restrictions: No   Pain: Pain Assessment Pain Assessment: No/denies pain   Balance: Balance Balance Assessed: Yes Standardized Balance Assessment Standardized Balance Assessment: Berg Balance Test Berg Balance Test Sit to Stand: Able to stand without using hands and stabilize independently Standing Unsupported: Able to stand safely 2 minutes Sitting with Back Unsupported  but Feet Supported on Floor or Stool: Able to sit safely and securely 2 minutes Stand to Sit: Sits independently, has uncontrolled descent Transfers: Able to transfer safely, minor use of hands Standing Unsupported with Eyes Closed: Able to stand 10 seconds safely Standing Ubsupported with Feet Together: Able to place feet together independently and stand 1 minute safely From Standing, Reach Forward with Outstretched Arm: Can  reach forward >12 cm safely (5") From Standing Position, Pick up Object from Floor: Able to pick up shoe safely and easily From Standing Position, Turn to Look Behind Over each Shoulder: Looks behind one side only/other side shows less weight shift (less weight shift to L side) Turn 360 Degrees: Needs close supervision or verbal cueing (20 seconds to R, 8 seconds to L with PT managing O2 tubing) Standing Unsupported, Alternately Place Feet on Step/Stool: Needs assistance to keep from falling or unable to try (completed 4 steps but then required assistance to prevent LOB) Standing Unsupported, One Foot in Front: Loses balance while stepping or standing Standing on One Leg: Unable to try or needs assist to prevent fall Total Score: 36   See Function Navigator for Current Functional Status.   Therapy/Group: Individual Therapy  Sandi Mariscal 06/17/2015, 7:46 AM f

## 2015-06-17 NOTE — Progress Notes (Signed)
Occupational Therapy Session Note  Patient Details  Name: Juan Cameron MRN: [id number] Date of Birth: 1976-01-03  Today's Date: 06/17/2015 OT Individual Time: [id number] OT Individual Time Calculation (min): 59 min    Short Term Goals: Week 1:  OT Short Term Goal 1 (Week 1): Pt will engage in 10 minutes of functional tasks with 2 or less rest breaks. OT Short Term Goal 2 (Week 1): Pt will perform LB dressing with min A in order to increase I in self care. OT Short Term Goal 3 (Week 1): Pt willl perform clothing managemenmt with contact guard for balance after toileting in order to increase balance with functional tasks.  Skilled Therapeutic Interventions/Progress Updates:    Upon entering the room, pt seated in wheelchair and watching television but agreeable to OT intervention. PT requesting to wash self at sink. Pt obtained clothing items from dresser while seated in wheelchair. Pt engaged in bathing from wheelchair with sit <>stand with supervision for balance while washing buttocks and peri area. Pt performed dressing tasks from wheelchair with sit <>Stand with supervision as well. Pt needing multiple rest breaks secondary to fatigue but continues to use 3L O2 via [place] with saturation remaining above 93% throughout session. Pt eating snack of graham crackers with supervision and min cues for small bites. Pt remained in wheelchair with quick release belt donned and call bell within reach upon exiting the room.  Therapy Documentation Precautions:  Precautions Precautions: Fall Precaution Comments: 3L/min supplemental oxygen via nasal cannula Restrictions Weight Bearing Restrictions: No General:   Vital Signs: Therapy Vitals Temp: 98.4 F (36.9 C) Temp Source: Oral Pain: Pain Assessment Pain Assessment: No/denies pain Faces Pain Scale: Hurts little more Pain Type: Acute pain Pain Location: Throat Pain Descriptors / Indicators: Aching Pain Onset: On-going Pain Intervention(s): RN  made aware Multiple Pain Sites: No A See Function Navigator for Current Functional Status.   Therapy/Group: Individual Therapy  Lowella Gripittman, Darian Ace L 06/17/2015, 12:37 PM

## 2015-06-17 NOTE — Progress Notes (Signed)
Physical Therapy Session Note  Patient Details  Name: Juan Cameron MRN: [id number] Date of Birth: [birth year]  Today's Date: 06/17/2015 PT Missed Time (Makeup Session): 29 Minutes Missed Time Reason: Patient unwilling to participate;Patient fatigue  Pt received resting in w/c, c/o pain in nose and throat and declining therapy.  PT offered for pt to come get a cold or hot drink to soothe his sore throat, he declined.  PT offered to work on balance with Wii in therapy gym, pt declined.  PT offered pt opportunity to toilet and get back to his bed, but he continued to decline.  Pt left sitting upright in w/c with QRB in place, call bell in reach and needs met.  Missed 45 minutes of scheduled make up therapy time.  RN aware.    See Function Navigator for Current Functional Status.   Therapy/Group: Individual Therapy  Juan Cameron 06/17/2015, 4:21 PM

## 2015-06-17 NOTE — Progress Notes (Signed)
Speech Language Pathology Daily Session Note  Patient Details  Name: Juan Cameron MRN: [id number] Date of Birth: 1975-11-16  Today's Date: 06/17/2015 SLP Individual Time: 0830-0900 SLP Individual Time Calculation (min): 30 min  Short Term Goals: Week 1: SLP Short Term Goal 1 (Week 1): Pt will consume regular textures and thin liquids with mod I use of safe swallowing strategies.   SLP Short Term Goal 1 - Progress (Week 1): Met SLP Short Term Goal 2 (Week 1): Pt will name 2 compensatory swallow strategies with Mod I support.  SLP Short Term Goal 3 (Week 1): Pt to demonstrate sustained phonation with easy onset for 3 seconds with mod A. SLP Short Term Goal 4 (Week 1): Pt to demonstrate inhalation over a period of 2 seconds with mod A. SLP Short Term Goal 5 (Week 1): Pt will utilize external aids to recall and utilize vocal hygiene procedures with Min verbal cues   Skilled Therapeutic Interventions: Skilled treatment session focused on addressing speech intelligibility and vocal quality goals. SLP facilitated session by providing Max encouragement to participate due to patient reports of having a sore throat.  SLP provided Mod verbal and demonstrative cues for patient to complete deep breathing for 2 seconds and easy onset of sustained phonation 2 seconds; however, patient reported lots of pain in his throat during the exercises; as a result task was ended.  Patient with moderately reduced intelligibility due to vocal quality and hoarse, raspy quality.  Patient encouraged to drink water; however, did not do so until end of session at which point vocal quality and overall intelligibility was greatly improved.   Recommend education regarding vocal hygiene tomorrow.      Function:  Eating Eating   Modified Consistency Diet: No Eating Assist Level: Supervision or verbal cues           Cognition Comprehension Comprehension assist level: Understands basic 75 - 89% of the time/ requires cueing  10 - 24% of the time  Expression   Expression assist level: Expresses basic 50 - 74% of the time/requires cueing 25 - 49% of the time. Needs to repeat parts of sentences.  Social Interaction Social Interaction assist level: Interacts appropriately 75 - 89% of the time - Needs redirection for appropriate language or to initiate interaction.  Problem Solving Problem solving assist level: Solves basic 50 - 74% of the time/requires cueing 25 - 49% of the time  Memory Memory assist level: Recognizes or recalls 50 - 74% of the time/requires cueing 25 - 49% of the time    Pain Pain Assessment Pain Assessment: No/denies pain Faces Pain Scale: Hurts little more Pain Type: Acute pain Pain Location: Throat Pain Descriptors / Indicators: Aching Pain Onset: On-going Pain Intervention(s): RN made aware Multiple Pain Sites: No  Therapy/Group: Individual Therapy  Carmelia Roller., [place] [phone]  [place] 06/17/2015, 12:29 PM

## 2015-06-18 ENCOUNTER — Inpatient Hospital Stay (HOSPITAL_COMMUNITY): Payer: Medicare Other | Admitting: Occupational Therapy

## 2015-06-18 ENCOUNTER — Inpatient Hospital Stay (HOSPITAL_COMMUNITY): Payer: Medicare Other | Admitting: Physical Therapy

## 2015-06-18 DIAGNOSIS — J9691 Respiratory failure, unspecified with hypoxia: Secondary | ICD-10-CM

## 2015-06-18 LAB — CBC WITH DIFFERENTIAL/PLATELET
Basophils Absolute: 0 10*3/uL (ref 0.0–0.1)
Basophils Relative: 0 %
EOS PCT: 7 %
Eosinophils Absolute: 1.1 10*3/uL — ABNORMAL HIGH (ref 0.0–0.7)
HEMATOCRIT: 27.9 % — AB (ref 39.0–52.0)
Hemoglobin: 8.9 g/dL — ABNORMAL LOW (ref 13.0–17.0)
LYMPHS ABS: 3.2 10*3/uL (ref 0.7–4.0)
Lymphocytes Relative: 20 %
MCH: 26.4 pg (ref 26.0–34.0)
MCHC: 31.9 g/dL (ref 30.0–36.0)
MCV: 82.8 fL (ref 78.0–100.0)
MONO ABS: 1.1 10*3/uL — AB (ref 0.1–1.0)
MONOS PCT: 7 %
NEUTROS ABS: 10.6 10*3/uL — AB (ref 1.7–7.7)
Neutrophils Relative %: 66 %
PLATELETS: 230 10*3/uL (ref 150–400)
RBC: 3.37 MIL/uL — ABNORMAL LOW (ref 4.22–5.81)
RDW: 17.5 % — AB (ref 11.5–15.5)
WBC: 15.9 10*3/uL — ABNORMAL HIGH (ref 4.0–10.5)

## 2015-06-18 NOTE — Progress Notes (Signed)
Occupational Therapy Session Note  Patient Details  Name: Juan Cameron MRN: [id number] Date of Birth: 10-12-1975  Today's Date: 06/18/2015 OT Individual Time: 1303-1403 OT Individual Time Calculation (min): 60 min    Short Term Goals: Week 1:  OT Short Term Goal 1 (Week 1): Pt will engage in 10 minutes of functional tasks with 2 or less rest breaks. OT Short Term Goal 2 (Week 1): Pt will perform LB dressing with min A in order to increase I in self care. OT Short Term Goal 3 (Week 1): Pt willl perform clothing managemenmt with contact guard for balance after toileting in order to increase balance with functional tasks.      Skilled Therapeutic Interventions/Progress Updates:    Pt seen for skilled OT to facilitate dynamic standing balance and standing tolerance. Pt received in room and self propelled to gym. Pt wanted to play wii bowling. Pt asked to stand each time it was his turn to bowl.  On 2nd game pt wanted to sit. Negotiated pt to stand on even games, sit on odd games. Pt able to sit to stand with S. Pt needed to toilet but was not willing to walk to bathroom, took w/c. Pt used toilet transferring from w/c. Pt in room with all needs met.    Therapy Documentation Precautions:  Precautions Precautions: Fall Precaution Comments: 3L/min supplemental oxygen via nasal cannula Restrictions Weight Bearing Restrictions: No   Vital Signs: Therapy Vitals Temp: 97.7 F (36.5 C) Temp Source: Oral Pulse Rate: (!) 118 (notify nurse) Resp: 18 BP: 121/79 mmHg Patient Position (if appropriate): Lying Oxygen Therapy SpO2: 94 % O2 Device: Nasal Cannula O2 Flow Rate (L/min): 3 L/min Pain: Pain Assessment Pain Assessment: No/denies pain ADL:   See Function Navigator for Current Functional Status.   Therapy/Group: Individual Therapy  [place] 06/18/2015, 10:01 AM

## 2015-06-18 NOTE — Plan of Care (Signed)
Problem: RH Stairs Goal: LTG Patient will ambulate up and down stairs w/assist (PT) LTG: Patient will ambulate up and down # of stairs with assistance (PT)  Downgrade 2/2 decreased balance & slow progress

## 2015-06-18 NOTE — Progress Notes (Signed)
[facility] PHYSICAL MEDICINE & REHABILITATION     PROGRESS NOTE  Subjective/Complaints:  Up in bed. No complaints. Denies pain or breathing issues. Declining therapies at times.     ROS: Appears to denies CP, SOB, nausea, vomiting, diarrhea, however limited communication.  Objective: Vital Signs: Blood pressure 121/79, pulse 118, temperature 97.7 F (36.5 C), temperature source Oral, resp. rate 18, weight 90.311 kg (199 lb 1.6 oz), SpO2 94 %. No results found.  Recent Labs  06/17/15 0533 06/18/15 0546  WBC 16.8* 15.9*  HGB 9.6* 8.9*  HCT 30.7* 27.9*  PLT 227 230   No results for input(s): NA, K, CL, GLUCOSE, BUN, CREATININE, CALCIUM in the last 72 hours.  Invalid input(s): CO CBG (last 3)  No results for input(s): GLUCAP in the last 72 hours.  Wt Readings from Last 3 Encounters:  06/18/15 90.311 kg (199 lb 1.6 oz)  06/10/15 84.1 kg (185 lb 6.5 oz)  05/16/15 91.536 kg (201 lb 12.8 oz)    Physical Exam:  BP 121/79 mmHg  Pulse 118  Temp(Src) 97.7 F (36.5 C) (Oral)  Resp 18  Wt 90.311 kg (199 lb 1.6 oz)  SpO2 94% Gen: NAD. Well-developed, well-nourished HENT: trach site healed. Normocephalic.  Eyes: Conjunctivae and EOM are normal.  Cardiovascular: Normal rate and regular rhythm  Respiratory: Effort normal. No respiratory distress GI: Soft. Bowel sounds are normal. He exhibits no distension Neurological. Patient was alert. Followed simple commands occasionally. Moving all 4 extremities without difficulty, likely 4/5 b/l UE, and 4/5 b/l LE (?effort).   Skin. Warm and dry. Psych: pt very flat, however, animated and engaged depending on person  Assessment/Plan: 1. Functional deficits secondary to respiratory failure/MRSA pneumonia which require 3+ hours per day of interdisciplinary therapy in a comprehensive inpatient rehab setting. Physiatrist is providing close team supervision and 24 hour management of active medical problems listed below. Physiatrist and  rehab team continue to assess barriers to discharge/monitor patient progress toward functional and medical goals.  Function:  Bathing Bathing position   Position: Wheelchair/chair at sink  Bathing parts Body parts bathed by patient: Right arm, Left arm, Abdomen, Chest, Front perineal area, Right upper leg, Left upper leg, Right lower leg, Left lower leg, Buttocks Body parts bathed by helper: Back  Bathing assist Assist Level: Supervision or verbal cues   Set up : To obtain items  Upper Body Dressing/Undressing Upper body dressing   What is the patient wearing?: Pull over shirt/dress     Pull over shirt/dress - Perfomed by patient: Thread/unthread right sleeve, Thread/unthread left sleeve, Put head through opening, Pull shirt over trunk          Upper body assist Assist Level: Set up   Set up : To obtain clothing/put away  Lower Body Dressing/Undressing Lower body dressing   What is the patient wearing?: Underwear, Pants, Non-skid slipper socks Underwear - Performed by patient: Thread/unthread right underwear leg, Thread/unthread left underwear leg, Pull underwear up/down   Pants- Performed by patient: Thread/unthread right pants leg, Thread/unthread left pants leg, Pull pants up/down, Fasten/unfasten pants   Non-skid slipper socks- Performed by patient: Don/doff left sock, Don/doff right sock       Shoes - Performed by patient: Don/doff right shoe, Don/doff left shoe, Fasten right, Fasten left Shoes - Performed by helper: Fasten right, Fasten left       TED Hose - Performed by helper: Don/doff right TED hose, Don/doff left TED hose  Lower body assist Assist for lower body dressing:  Set up, Supervision or verbal cues   Set up : To obtain clothing/put away  Toileting Toileting Toileting activity did not occur: N/A Toileting steps completed by patient: Performs perineal hygiene, Adjust clothing after toileting, Adjust clothing prior to toileting Toileting steps completed  by helper: Adjust clothing after toileting Toileting Assistive Devices: Grab bar or rail  Toileting assist Assist level: Touching or steadying assistance (Pt.75%)   Transfers Chair/bed transfer Chair/bed transfer activity did not occur: N/A Chair/bed transfer method: Stand pivot Chair/bed transfer assist level: Supervision or verbal cues Chair/bed transfer assistive device: Patent attorneyWalker     Locomotion Ambulation     Max distance: 200 ft Assist level: Supervision or verbal cues   Wheelchair   Type: Manual Max wheelchair distance: 2475ft Assist Level: Supervision or verbal cues  Cognition Comprehension Comprehension assist level: Understands basic 75 - 89% of the time/ requires cueing 10 - 24% of the time  Expression Expression assist level: Expresses basic 50 - 74% of the time/requires cueing 25 - 49% of the time. Needs to repeat parts of sentences.  Social Interaction Social Interaction assist level: Interacts appropriately 75 - 89% of the time - Needs redirection for appropriate language or to initiate interaction.  Problem Solving Problem solving assist level: Solves basic 50 - 74% of the time/requires cueing 25 - 49% of the time  Memory Memory assist level: Recognizes or recalls 50 - 74% of the time/requires cueing 25 - 49% of the time    Medical Problem List and Plan: 1. Debilitation secondary to respiratory failure/MRSA pneumonia.   Contact precautions. All antibiotic therapy is completed.   -Cont CIR--team conf tomorrow  Taper of prednisone over 2 weeks, began on 6/1 2. DVT Prophylaxis/Anticoagulation: Subcutaneous heparin. Monitor for any bleeding episodes 3. Pain Management: Tylenol as needed 4. Mood/severe autism: Klonopin 0.5 mg daily at bedtime, Zyprexa 5 mg twice a day. Patient appears to be at his baseline per family 5. Neuropsych: This patient is not capable of making decisions on his own behalf. 6. Skin/Wound Care: Routine skin checks 7. Fluids/Electrolytes/Nutrition:  Routine I&O's 8. Dysphagia. Advanced to regular diet on 5/31.   Monitor for any signs of aspiration 9. Acute on chronic anemia. Continue iron supplement.   Hb 9.6 on 6/6  Hemoccult stools remain pending---still not done 10. History of sickle cell trait. 11. Leukocytosis: Afebrile  Possibly secondary to steroids  WBCs 16.8 on 6/6 ---15.9 today.  will continue to monitor in accordance with steroid taper  CXR on 6/2 reviewed, likely atelectasis  UA neg, Ucx on 6/2 with multiple species, ucx on 6/5 without sig growth  LOS (Days) 8 A FACE TO FACE EVALUATION WAS PERFORMED  SWARTZ,ZACHARY T 06/18/2015 9:06 AM

## 2015-06-18 NOTE — Progress Notes (Signed)
Physical Therapy Session Note  Patient Details  Name: Juan Cameron MRN: [id number] Date of Birth: 1975/02/20  Today's Date: 06/18/2015 PT Individual Time: 1003-1033 PT Individual Time Calculation (min): 30 min   Short Term Goals: Week 1:  PT Short Term Goal 1 (Week 1): Pt will negotiate 4 steps with 1 rail with steady A. PT Short Term Goal 1 - Progress (Week 1): Progressing toward goal PT Short Term Goal 2 (Week 1): Pt will ambulate 50 ft with LRAD & Min A.  PT Short Term Goal 2 - Progress (Week 1): Met PT Short Term Goal 3 (Week 1): Pt will tolerate 15 consecutive minutes of therapy while maintaining >90% SpO2. PT Short Term Goal 3 - Progress (Week 1): Progressing toward goal  Skilled Therapeutic Interventions/Progress Updates:    Pt received resting in chair with no c/o pain and agreeable to therapy session.  Session focus on endurance and gait training.  Pt transfers sit<>stand x3 throughout session with RW and supervision.  Pt ambulates throughout unit from room>ortho gym>day room>main gym>room with 2 seated rest breaks (max gait distance >500') with distant supervision.  PT provided education on deep breathing in through nose and out through mouth during rest breaks.  O2 sats remained 100% on 4L O2 via nasal cannula.  Pt returned to room at end of session and left upright in w/c with QRB in place, call bell in reach and needs met.   Therapy Documentation Precautions:  Precautions Precautions: Fall Precaution Comments: 3L/min supplemental oxygen via nasal cannula Restrictions Weight Bearing Restrictions: No   See Function Navigator for Current Functional Status.   Therapy/Group: Individual Therapy  Earnest Conroy [place] 06/18/2015, 12:24 PM

## 2015-06-18 NOTE — Progress Notes (Signed)
Physical Therapy Weekly Progress Note  Patient Details  Name: Juan Cameron MRN: [id number] Date of Birth: Jul 07, 1975  Beginning of progress report period: Jun 11, 2015 End of progress report period: June 18, 2015  Today's Date: 06/18/2015 PT Individual Time: 5462-7035 and 1530-1601 PT Individual Time Calculation (min): 76 min and 31 min  Patient has met 1 of 3 short term goals.  Pt is progressing to 2/3 short term goals. Pt has made improvements in endurance & activity tolerance as pt is able to participate in standing activity for up to 8 minutes and ambulate up to 200 ft with RW. Pt continues do demonstrate impaired standing balance as noted by his score of 36/56 on Berg Balance Test. Overall pt continues to be limited by dependency on supplemental oxygen, decreased overall endurance & cognitive deficits.   Patient continues to demonstrate the following deficits: decreased safety awareness, impaired standing balance, decreased ability to negotiate stairs without railings, and therefore will continue to benefit from skilled PT intervention to enhance overall performance with activity tolerance and balance. Pt will also benefit from family education/training to ensure safety following d/c.  Patient progressing toward long term goals. Continue plan of care.  PT Short Term Goals Week 1:  PT Short Term Goal 1 (Week 1): Pt will negotiate 4 steps with 1 rail with steady A. PT Short Term Goal 1 - Progress (Week 1): Progressing toward goal PT Short Term Goal 2 (Week 1): Pt will ambulate 50 ft with LRAD & Min A.  PT Short Term Goal 2 - Progress (Week 1): Met PT Short Term Goal 3 (Week 1): Pt will tolerate 15 consecutive minutes of therapy while maintaining >90% SpO2. PT Short Term Goal 3 - Progress (Week 1): Progressing toward goal Week 2:  PT Short Term Goal 1 (Week 2): STG = LTG due to anticipated d/c date.  Skilled Therapeutic Interventions/Progress Updates:    Treatment 1: Pt received in bed &  agreeable to PT, denying c/o pain. Pt requesting to eat breakfast; PT set up pt with breakfast tray with pt sitting EOB. Pt required frequent cuing for small bits & small sips while eating. Pt attempting to take larger bites when possible & PT educated pt in layman's terms on need to take small bites/sips to prevent choking. Also educated pt to swallow one bite of food before taking another. Gait training x 10 ft to/from bathroom without AD for higher level balance training & pt requiring Min A; (+) void. Pt completed hand hygiene at sink without BUE support & steady A. Gait training x 120 ft + 120 ft without AD with pt requiring Min A 2/2 increased lateral sway & intermittent scissoring gait causing pt to lose balance and needing assistance to correct. Pt's SpO2 =84-87% on 3L/min & HR=141 bpm after ambulation. Instructed pt on pursed lip breathing with pt resisting 2/2 "my nose hurts"; therapist educated pt on importance of increasing oxygen saturation levels. At end of session pt returned to room & left sitting on EOB with all needs within reach & bed alarm set.   Treatment 2: Pt received in w/c & agreeable to PT, denying c/o pain. Pt's SpO2 = 100% on 3L/min at rest. Pt reported need to use restroom & ambulated w/c>bathroom in room with RW & supervision A. Pt able to stand without BUE support for toileting, (+) void. Gait training x 120 ft room>gym with RW & AD. Reinforced need to use RW at all times. Completed stair negotiation forwards x 4  steps with RW & Min A to stabilize RW. Pt still fearful during task as he will reach for rails; PT cued pt not to do so. Pt unwilling to attempt stair negotiation a 2nd time. Gait training x 120 ft back to room with RW & supervision with PT managing O2 tank.  Completed 3 trials of sit<>Stand without BUE support with min A focusing on BLE strengthening & improving eccentric control with stand>sit. At end of session pt left in w/c with all needs within reach, QRB in place &  SpO2 =100% on 3L/min.  Therapy Documentation Precautions:  Precautions Precautions: Fall Precaution Comments: 3L/min supplemental oxygen via nasal cannula Restrictions Weight Bearing Restrictions: No  Pain: Pain Assessment Pain Assessment: No/denies pain   See Function Navigator for Current Functional Status.  Therapy/Group: Individual Therapy  Waunita Schooner 06/18/2015, 7:44 AM

## 2015-06-18 NOTE — Progress Notes (Signed)
Social Work Patient ID: Juan Cameron, male   DOB: Nov 06, 1975, 40 y.o.   MRN: [id number] Have spoken with [name]-sister to let her know received information she left last night for me to complete for CAP services. PCS paperworkwas faxed yesterday and copy given to sister Have left paperwork for CAP in pt';s room and he is aware to let his sister know it is there for her. Working toward discharge 6/13.

## 2015-06-19 ENCOUNTER — Inpatient Hospital Stay (HOSPITAL_COMMUNITY): Payer: Self-pay | Admitting: Occupational Therapy

## 2015-06-19 ENCOUNTER — Inpatient Hospital Stay (HOSPITAL_COMMUNITY): Payer: Self-pay | Admitting: Physical Therapy

## 2015-06-19 ENCOUNTER — Inpatient Hospital Stay (HOSPITAL_COMMUNITY): Payer: Medicare Other | Admitting: Speech Pathology

## 2015-06-19 LAB — CBC WITH DIFFERENTIAL/PLATELET
BASOS ABS: 0 10*3/uL (ref 0.0–0.1)
BASOS PCT: 0 %
Eosinophils Absolute: 0.8 10*3/uL — ABNORMAL HIGH (ref 0.0–0.7)
Eosinophils Relative: 6 %
HEMATOCRIT: 28.2 % — AB (ref 39.0–52.0)
HEMOGLOBIN: 8.9 g/dL — AB (ref 13.0–17.0)
Lymphocytes Relative: 20 %
Lymphs Abs: 2.7 10*3/uL (ref 0.7–4.0)
MCH: 26.2 pg (ref 26.0–34.0)
MCHC: 31.6 g/dL (ref 30.0–36.0)
MCV: 82.9 fL (ref 78.0–100.0)
Monocytes Absolute: 0.9 10*3/uL (ref 0.1–1.0)
Monocytes Relative: 6 %
NEUTROS ABS: 9 10*3/uL — AB (ref 1.7–7.7)
NEUTROS PCT: 68 %
Platelets: 225 10*3/uL (ref 150–400)
RBC: 3.4 MIL/uL — ABNORMAL LOW (ref 4.22–5.81)
RDW: 17.4 % — AB (ref 11.5–15.5)
WBC: 13.4 10*3/uL — ABNORMAL HIGH (ref 4.0–10.5)

## 2015-06-19 NOTE — Progress Notes (Signed)
Occupational Therapy Session Note  Patient Details  Name: Juan RipperRobert Kiss MRN: [id number] Date of Birth: Jul 23, 1975  Today's Date: 06/19/2015 OT Individual Time: 0930-1000 OT Individual Time Calculation (min): 30 min    Short Term Goals: Week 2:  OT Short Term Goal 1 (Week 2): STGs=LTGs secondary to ELOS  Skilled Therapeutic Interventions/Progress Updates:    Therapeutic activity to focus on activity tolerance, cardiopulmonary endurance and dynamic balance while engaging in playing the Wii (pt's choice of activity). Pt stood for 80% of the session with only 2 seated breaks. Pt's balance challenging with standing in tandem stance (left LE in front of right LE) and standing on foam without UE support. Pt able to sustain tandem stance for 2-3 min with close supervision and min A for standing on foam. Pt sustained O2 sats on 2 liters during activity at 90-93%. Returned to room propelling self with bilateral feet.   Therapy Documentation Precautions:  Precautions Precautions: Fall Precaution Comments: 3L/min supplemental oxygen via nasal cannula Restrictions Weight Bearing Restrictions: No Pain: C/o right knee pain at end of session - allowed for rest and break in between therapies  See Function Navigator for Current Functional Status.   Therapy/Group: Individual Therapy  Roney MansSmith, Denee Boeder [facility] 06/19/2015, 8:08 PM

## 2015-06-19 NOTE — Progress Notes (Signed)
Speech Language Pathology Weekly Progress and Session Note  Patient Details  Name: Juan Cameron MRN: [id number] Date of Birth: 1975-01-30  Beginning of progress report period: June 12, 2015 End of progress report period: June 19, 2015  Today's Date: 06/19/2015 SLP Individual Time: 0830-0930 SLP Individual Time Calculation (min): 60 min  Short Term Goals: Week 1: SLP Short Term Goal 1 (Week 1): Pt will consume regular textures and thin liquids with mod I use of safe swallowing strategies.   SLP Short Term Goal 1 - Progress (Week 1): Met SLP Short Term Goal 2 (Week 1): Pt will name 2 compensatory swallow strategies with Mod I support.  SLP Short Term Goal 2 - Progress (Week 1): Met SLP Short Term Goal 3 (Week 1): Pt to demonstrate sustained phonation with easy onset for 3 seconds with mod A. SLP Short Term Goal 3 - Progress (Week 1): Met SLP Short Term Goal 4 (Week 1): Pt to demonstrate inhalation over a period of 2 seconds with mod A. SLP Short Term Goal 4 - Progress (Week 1): Met SLP Short Term Goal 5 (Week 1): Pt will utilize external aids to recall and utilize vocal hygiene procedures with Min verbal cues     New Short Term Goals: Week 2: SLP Short Term Goal 1 (Week 2): Pt will utilize external aids to recall and utilize vocal hygiene procedures with Min verbal cues.  Weekly Progress Updates:  Pt has met dysphagia goals and focus of therapy has shifted to voice production and vocal hygiene. Will followup with family education to promote carryover following d/c from [facility].   Intensity: Minumum of 1-2 x/day, 30 to 90 minutes Frequency: 3 to 5 out of 7 days Duration/Length of Stay: 10 to 14 days Treatment/Interventions: Dysphagia/aspiration precaution training;Environmental controls;Patient/family education   Daily Session  Skilled Therapeutic Interventions: Pt seen for skilled SLP therapy with focus on voice production. Pt able to consistently demonstrate 2 sec inhalation and 3 sec  sustained phonation with mod A of verbal cueing and occasional model. Improved vocal quality with decreased harshness and easy onset of phonation. Pt had one episode of glottal attack with phonation which was remarkable as the pt indicated, "That hurt my throat." Discussed using an appropriate vocal intensity and "calm voice" to avoid hurting his throat with loud speech. Pt verbalized understanding.     Function:   Eating Eating Eating activity did not occur: N/A               Cognition Comprehension Comprehension assist level: Understands basic 75 - 89% of the time/ requires cueing 10 - 24% of the time  Expression   Expression assist level: Expresses basic 50 - 74% of the time/requires cueing 25 - 49% of the time. Needs to repeat parts of sentences.  Social Interaction Social Interaction assist level: Interacts appropriately 75 - 89% of the time - Needs redirection for appropriate language or to initiate interaction.  Problem Solving Problem solving assist level: Solves basic 50 - 74% of the time/requires cueing 25 - 49% of the time  Memory Memory assist level: Recognizes or recalls 50 - 74% of the time/requires cueing 25 - 49% of the time   General    Pain Pain Assessment Pain Assessment: No/denies pain  Therapy/Group: Individual Therapy  [name] 06/19/2015, 4:03 PM

## 2015-06-19 NOTE — Progress Notes (Signed)
[facility] PHYSICAL MEDICINE & REHABILITATION     PROGRESS NOTE  Subjective/Complaints:  No new complaints. Sitting up in bed again.      ROS: Appears to denies CP, SOB, nausea, vomiting, diarrhea, however limited communication.  Objective: Vital Signs: Blood pressure 130/69, pulse 100, temperature 98 F (36.7 C), temperature source Oral, resp. rate 18, weight 89.313 kg (196 lb 14.4 oz), SpO2 100 %. No results found.  Recent Labs  06/18/15 0546 06/19/15 0510  WBC 15.9* 13.4*  HGB 8.9* 8.9*  HCT 27.9* 28.2*  PLT 230 225   No results for input(s): NA, K, CL, GLUCOSE, BUN, CREATININE, CALCIUM in the last 72 hours.  Invalid input(s): CO CBG (last 3)  No results for input(s): GLUCAP in the last 72 hours.  Wt Readings from Last 3 Encounters:  06/19/15 89.313 kg (196 lb 14.4 oz)  06/10/15 84.1 kg (185 lb 6.5 oz)  05/16/15 91.536 kg (201 lb 12.8 oz)    Physical Exam:  BP 130/69 mmHg  Pulse 100  Temp(Src) 98 F (36.7 C) (Oral)  Resp 18  Wt 89.313 kg (196 lb 14.4 oz)  SpO2 100% Gen: NAD. Well-developed, well-nourished HENT: trach site healed. Normocephalic.  Eyes: Conjunctivae and EOM are normal.  Cardiovascular: Normal rate and regular rhythm  Respiratory: Effort normal. No respiratory distress GI: Soft. Bowel sounds are normal. He exhibits no distension Neurological. Patient was alert. Followed simple commands occasionally. Moving all 4 extremities without difficulty, likely 4/5 b/l UE, and 4/5 b/l LE (?effort).   Skin. Warm and dry. Psych: pt very flat  Assessment/Plan: 1. Functional deficits secondary to respiratory failure/MRSA pneumonia which require 3+ hours per day of interdisciplinary therapy in a comprehensive inpatient rehab setting. Physiatrist is providing close team supervision and 24 hour management of active medical problems listed below. Physiatrist and rehab team continue to assess barriers to discharge/monitor patient progress toward  functional and medical goals.  Function:  Bathing Bathing position   Position: Wheelchair/chair at sink  Bathing parts Body parts bathed by patient: Right arm, Left arm, Abdomen, Chest, Front perineal area, Right upper leg, Left upper leg, Right lower leg, Left lower leg, Buttocks Body parts bathed by helper: Back  Bathing assist Assist Level: Supervision or verbal cues   Set up : To obtain items  Upper Body Dressing/Undressing Upper body dressing   What is the patient wearing?: Pull over shirt/dress     Pull over shirt/dress - Perfomed by patient: Thread/unthread right sleeve, Thread/unthread left sleeve, Put head through opening, Pull shirt over trunk          Upper body assist Assist Level: Set up   Set up : To obtain clothing/put away  Lower Body Dressing/Undressing Lower body dressing   What is the patient wearing?: Underwear, Pants, Non-skid slipper socks Underwear - Performed by patient: Thread/unthread right underwear leg, Thread/unthread left underwear leg, Pull underwear up/down   Pants- Performed by patient: Thread/unthread right pants leg, Thread/unthread left pants leg, Pull pants up/down, Fasten/unfasten pants   Non-skid slipper socks- Performed by patient: Don/doff left sock, Don/doff right sock       Shoes - Performed by patient: Don/doff right shoe, Don/doff left shoe, Fasten right, Fasten left Shoes - Performed by helper: Fasten right, Fasten left       TED Hose - Performed by helper: Don/doff right TED hose, Don/doff left TED hose  Lower body assist Assist for lower body dressing: Set up, Supervision or verbal cues   Set up : To  obtain clothing/put away  Toileting Toileting Toileting activity did not occur: N/A Toileting steps completed by patient: Performs perineal hygiene, Adjust clothing after toileting, Adjust clothing prior to toileting Toileting steps completed by helper: Adjust clothing after toileting Toileting Assistive Devices: Grab bar or  rail  Toileting assist Assist level: Supervision or verbal cues   Transfers Chair/bed transfer Chair/bed transfer activity did not occur: N/A Chair/bed transfer method: Ambulatory Chair/bed transfer assist level: Supervision or verbal cues Chair/bed transfer assistive device: Armrests, Patent attorneyWalker     Locomotion Ambulation     Max distance: 120 ft Assist level: Supervision or verbal cues   Wheelchair   Type: Manual Max wheelchair distance: 4875ft Assist Level: Supervision or verbal cues  Cognition Comprehension Comprehension assist level: Understands basic 75 - 89% of the time/ requires cueing 10 - 24% of the time  Expression Expression assist level: Expresses basic 50 - 74% of the time/requires cueing 25 - 49% of the time. Needs to repeat parts of sentences.  Social Interaction Social Interaction assist level: Interacts appropriately 75 - 89% of the time - Needs redirection for appropriate language or to initiate interaction.  Problem Solving Problem solving assist level: Solves basic 50 - 74% of the time/requires cueing 25 - 49% of the time  Memory Memory assist level: Recognizes or recalls 50 - 74% of the time/requires cueing 25 - 49% of the time    Medical Problem List and Plan: 1. Debilitation secondary to respiratory failure/MRSA pneumonia.   Contact precautions. All antibiotic therapy is completed.   -pt is medically stable for discharge  Taper of prednisone over 2 weeks, began on 6/1 2. DVT Prophylaxis/Anticoagulation: Subcutaneous heparin. Monitor for any bleeding episodes 3. Pain Management: Tylenol as needed 4. Mood/severe autism: Klonopin 0.5 mg daily at bedtime, Zyprexa 5 mg twice a day. Patient appears to be at his baseline per family 5. Neuropsych: This patient is not capable of making decisions on his own behalf. 6. Skin/Wound Care: Routine skin checks 7. Fluids/Electrolytes/Nutrition: Routine I&O's 8. Dysphagia. Advanced to regular diet on 5/31.   Monitor for any  signs of aspiration 9. Acute on chronic anemia. Continue iron supplement.   Hb 9.6 on 6/6  Hemoccult stools remain pending---still not done 10. History of sickle cell trait. 11. Leukocytosis: Afebrile  Possibly secondary to steroids  WBCs 16.8 on 6/6 ---15.9---now 13.4  CXR on 6/2 reviewed, likely atelectasis  ucx on 6/5 without sig growth  LOS (Days) 9 A FACE TO FACE EVALUATION WAS PERFORMED  Kyley Solow T 06/19/2015 9:32 AM

## 2015-06-19 NOTE — Progress Notes (Signed)
Physical Therapy Session Note  Patient Details  Name: Juan Cameron MRN: [id number] Date of Birth: 12-06-1975  Today's Date: 06/19/2015 PT Individual Time: 6761-9509 PT Individual Time Calculation (min): 55 min   Short Term Goals: Week 1:  PT Short Term Goal 1 (Week 1): Pt will negotiate 4 steps with 1 rail with steady A. PT Short Term Goal 1 - Progress (Week 1): Progressing toward goal PT Short Term Goal 2 (Week 1): Pt will ambulate 50 ft with LRAD & Min A.  PT Short Term Goal 2 - Progress (Week 1): Met PT Short Term Goal 3 (Week 1): Pt will tolerate 15 consecutive minutes of therapy while maintaining >90% SpO2. PT Short Term Goal 3 - Progress (Week 1): Progressing toward goal Week 2:  PT Short Term Goal 1 (Week 2): STG = LTG due to anticipated d/c date.  Skilled Therapeutic Interventions/Progress Updates:    Patient received sitting in WC and agreeable PT. Throughout PT patient remained on 2L supplemental O2 via nasal canual PT instructed patient in Gait training for 230f, 127f and 15029fith supervision A and RW. Min cues from PT to maintain BUE on RW for safety. PT assessed SpO2 and found patient to be at 83% following gait of 200f58f minutes of pursed lip breathing to improve to >90 %. Following gait training of [id number] desat to 86% and 2 minutes of seated rest break to increase to >90% Patient instructed in Car transfer with RW with supervision A from PT for improved safety with min cues for proper UE placement for exiting car.   Seated LE and UE therex with ball: Ball taps with 2lb bar weight 2 x 3 minutes.  Kicking soccer ball sitting for LE endurance training 2x 4 minutes using BLE.   Sit<>stand x 8 for wii bowling with balance training during self generated rotational pertubations with supervision A from PT, no AD, and min cues for increased control with stand>sit  Patient left sitting in WC at end of session with quick release belt in place and call bell within reach.     Therapy Documentation Precautions:  Precautions Precautions: Fall Precaution Comments: 3L/min supplemental oxygen via nasal cannula Restrictions Weight Bearing Restrictions: No General:   Vital Signs: Therapy Vitals Temp: 98.8 F (37.1 C) Temp Source: Oral Pulse Rate: (!) 129 Resp: (!) 25 BP: 137/83 mmHg Patient Position (if appropriate): Sitting Oxygen Therapy SpO2: 100 % O2 Device: Not Delivered Pain: Pain Assessment Pain Assessment: No/denies pain   See Function Navigator for Current Functional Status.   Therapy/Group: Individual Therapy  AustiLorie Phenix2017, 5:56 PM

## 2015-06-19 NOTE — Patient Care Conference (Signed)
Inpatient RehabilitationTeam Conference and Plan of Care Update Date: 06/19/2015   Time: 8:10 am    Patient Name: Juan Cameron      Medical Record Number: [id number]  Date of Birth: 07-08-1975 Sex: Male         Room/Bed: 4W18C/4W18C-01 Payor Info: Payor: MEDICARE / Plan: MEDICARE PART A AND B / Product Type: *No Product type* /    Admitting Diagnosis: Debility  Admit Date/Time:  06/10/2015  5:55 PM Admission Comments: No comment available   Primary Diagnosis:  Debility Principal Problem: Debility  Patient Active Problem List   Diagnosis Date Noted  . Dysphagia   . Acute blood loss anemia   . Anemia of chronic disease   . Respiratory failure ([place]) 06/10/2015  . Acute on chronic respiratory failure with hypoxia ([place])   . Chronic diastolic CHF (congestive heart failure) ([place])   . Sinus tachycardia ([place])   . Acute renal failure ([place])   . Sickle cell trait ([place])   . Essential hypertension   . SOB (shortness of breath)   . Pneumonia   . Acute pulmonary edema ([facility])   . Hypoxemia   . Sepsis ([place])   . Cough   . Right knee pain   . Cognitive deficits   . Tachycardia   . Debility 05/06/2015  . Encephalopathy 05/06/2015  . 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 Ventilator dependence ([place]) 04/08/2015  . Pressure ulcer 04/05/2015  . Acute respiratory failure ([place])   . Fever 03/21/2015  . Acute renal insufficiency 03/21/2015  . Obesity 03/21/2015  . ARDS (adult respiratory distress syndrome) ([place])   . HCAP (healthcare-associated pneumonia) 03/11/2015  . Leukocytosis 03/11/2015  . Acute hyperglycemia 03/11/2015  . Left ventricular diastolic dysfunction, NYHA class 2 03/11/2015  . Thrombocytopenia ([place]) 03/11/2015  . Acute lung injury 03/10/2015  . Acute respiratory failure with hypoxia ([place]) 02/28/2015  . CAP (community acquired pneumonia) 02/28/2015  . Hypokalemia 02/28/2015  . Autism 02/28/2015    Expected Discharge Date: Expected Discharge Date: 06/24/15  Team Members  Present: Physician leading conference: Dr. Alger Simons Social Worker Present: Ovidio Kin, LCSW Nurse Present: Dorien Chihuahua, RN PT Present: Barrie Folk, PT OT Present: Willeen Cass, OT SLP Present: Weldon Inches, SLP;Nicole Page, SLP PPS Coordinator present : Ileana Ladd, PT     Current Status/Progress Goal Weekly Team Focus  Medical   wbc's falling (steroid effect). steroids tapering. oxygen status stable  finalize medicaly for discharge  monitor blood count, oxygen   Bowel/Bladder   LBM 06/17/15  Patient independent with urinal but spills urinal in bed. Continent of bowel and bladder (frequency and urgency)  continent of bowel and bladder  coninue to monitor q shift   Swallow/Nutrition/ Hydration   regular  regular  met   ADL's   supervision to min A for self care tasks, require rest breaks due to decr activity toleance/ endurance and requiring supplumental O2  supervision overall  activity tolerance, self care retraining, safety with functional mobility   Mobility   ~120 ft with RW & minA/supervision A, fair activity tolerance & poor motivation, min A for stair negotiation with B rails, BUE support on RW for balance  mod I transfers & bed mobility, supervision for ambulation with LRAD, supervision to negotiate 3 steps without rails  family & pt education, endurance training & improving activity tolerance, gait training, stair training   Communication   min A for safe voicing  min A for safe voicing  family education   Safety/Cognition/ Behavioral  Observations  no unsafe behavior/ calls for assistance  min assist  continue to monitor q shift   Pain   no complaints of pain  Pain less than or equal to 2  assess and treate pain q ahift   Skin   stage II healing  Patient with no skin breakdown this admission  continue to monitor q shift      *See Care Plan and progress notes for long and short-term goals.  Barriers to Discharge: MS, buy-in    Possible Resolutions to  Barriers:  continue to engage    Discharge Planning/Teaching Needs:  Home with sister who is arranging 24 hr supervision via family and PCS along with getting on CAP waiting list.       Team Discussion:  Reaching his goals of supervision level. Trying to advance diet to regular currently Dys 3-thin. Balance issues-at times difficult to participate in therapies, would rather watch tv or play his games. Will require home O2. No further SP follow up. Sister working on 24 hr care at discharge  Revisions to Treatment Plan:  None   Continued Need for Acute Rehabilitation Level of Care: The patient requires daily medical management by a physician with specialized training in physical medicine and rehabilitation for the following conditions: Daily direction of a multidisciplinary physical rehabilitation program to ensure safe treatment while eliciting the highest outcome that is of practical value to the patient.: Yes Daily medical management of patient stability for increased activity during participation in an intensive rehabilitation regime.: Yes Daily analysis of laboratory values and/or radiology reports with any subsequent need for medication adjustment of medical intervention for : Pulmonary problems;Mood/behavior problems  Brytnee Bechler, Gardiner Rhyme 06/20/2015, 8:46 AM

## 2015-06-19 NOTE — Progress Notes (Addendum)
Patient slept well throughout the night. Assisted lab tech to draw patient's AM labs. Patient cooperative, despite being afraid of needles. Tolerated well. No acute complaints noted.  Patient refused AM dose of heparin stating "No more needles."

## 2015-06-19 NOTE — Progress Notes (Signed)
Occupational Therapy Weekly Progress Note & Session Note  Patient Details  Name: Juan Cameron MRN: [id number] Date of Birth: [birth year]  Beginning of progress report period: Jun 11, 2015 End of progress report period: June 19, 2015  Today's Date: 06/19/2015 OT Individual Time: 4158-3094 OT Individual Time Calculation (min): 43 min   Patient has met 3 of 3 short term goals.  Patient making good progress towards supervision LTGs. Pt limited by decreased initiation, as he requires mod to max cueing and encouragement to complete tasks. During this session, attempted pt on RA and sats decreased to as low as 84%  during activity. Pt reliant on 02, as when it is off he asks for it to be put back on.   Patient continues to demonstrate the following deficits: decreased overall activity tolerance/endurance, decreased ADL independence, decreased dynamic standing balance/tolerance/endurance and therefore will continue to benefit from skilled OT intervention to enhance overall performance with BADL.  Patient progressing toward long term goals..  Continue plan of care.  OT Short Term Goals Week 1:  OT Short Term Goal 1 (Week 1): Pt will engage in 10 minutes of functional tasks with 2 or less rest breaks. OT Short Term Goal 1 - Progress (Week 1): Met OT Short Term Goal 2 (Week 1): Pt will perform LB dressing with min A in order to increase I in self care. OT Short Term Goal 2 - Progress (Week 1): Met OT Short Term Goal 3 (Week 1): Pt willl perform clothing managemenmt with contact guard for balance after toileting in order to increase balance with functional tasks. OT Short Term Goal 3 - Progress (Week 1): Met   Week 2:  OT Short Term Goal 1 (Week 2): STGs=LTGs secondary to ELOS  Skilled Therapeutic Interventions/Progress Updates:  Patient found supine in bed with no complaints of pain, but complaints of being tired. Pt refused shower, but promised therapist he would shower tomorrow. Pt engaged in bed  mobility, sat EOB, then transferred into w/c. Pt completed UB/LB bathing and dressing in sit to/from stand position, requiring mod cueing for initiation and to stay on task. Pt on 3L/min supplemental 02 that therapist worked with taking off during activity. 02 sats dropped to as low as 84% on RA after activity, but quickly increased to 97% on 3L/min supplemental 02 via [place]. At end of session, left pt seated in w/c with all needs within reach and quick release belt donned.   Therapy Documentation Precautions:  Precautions Precautions: Fall Precaution Comments: 3L/min supplemental oxygen via nasal cannula Restrictions Weight Bearing Restrictions: No  Vital Signs: Therapy Vitals Temp: 98 F (36.7 C) Temp Source: Oral Pulse Rate: 100 Resp: 18 BP: 130/69 mmHg Patient Position (if appropriate): Sitting Oxygen Therapy SpO2: 100 % O2 Device: Nasal Cannula O2 Flow Rate (L/min): 3 L/min  See Function Navigator for Current Functional Status.  Therapy/Group: Individual Therapy  Chrys Racer , MS, OTR/L, CLT  06/19/2015, 2:01 PM

## 2015-06-20 ENCOUNTER — Inpatient Hospital Stay (HOSPITAL_COMMUNITY): Payer: Self-pay | Admitting: Physical Therapy

## 2015-06-20 ENCOUNTER — Inpatient Hospital Stay (HOSPITAL_COMMUNITY): Payer: Medicare Other | Admitting: Occupational Therapy

## 2015-06-20 ENCOUNTER — Inpatient Hospital Stay (HOSPITAL_COMMUNITY): Payer: Self-pay | Admitting: Occupational Therapy

## 2015-06-20 LAB — CBC WITH DIFFERENTIAL/PLATELET
BASOS ABS: 0 10*3/uL (ref 0.0–0.1)
Basophils Relative: 0 %
Eosinophils Absolute: 0.8 10*3/uL — ABNORMAL HIGH (ref 0.0–0.7)
Eosinophils Relative: 6 %
HCT: 28.1 % — ABNORMAL LOW (ref 39.0–52.0)
HEMOGLOBIN: 8.9 g/dL — AB (ref 13.0–17.0)
LYMPHS ABS: 2.9 10*3/uL (ref 0.7–4.0)
Lymphocytes Relative: 21 %
MCH: 26.7 pg (ref 26.0–34.0)
MCHC: 31.7 g/dL (ref 30.0–36.0)
MCV: 84.4 fL (ref 78.0–100.0)
MONO ABS: 0.8 10*3/uL (ref 0.1–1.0)
Monocytes Relative: 6 %
NEUTROS PCT: 67 %
Neutro Abs: 9.4 10*3/uL — ABNORMAL HIGH (ref 1.7–7.7)
Platelets: 225 10*3/uL (ref 150–400)
RBC: 3.33 MIL/uL — AB (ref 4.22–5.81)
RDW: 17.5 % — AB (ref 11.5–15.5)
WBC: 13.9 10*3/uL — AB (ref 4.0–10.5)

## 2015-06-20 NOTE — Progress Notes (Signed)
Upon entering patients room, RN noticed that patient was not connected to wall oxygen but was hooked up to portable tank oxygen which was empty.  Patient noted to be dyspneic.  Checked oxygen saturation-77% on room air at rest.  Put patient back on oxygen via [place] at 3L and patients oxygen saturation rose to 97%, with dyspnea resolving.  Tachycardic at 127 bpm.  Will continue to monitor oxygen levels as patient progresses.  Dani Gobbleeardon, Oumou Smead J, RN

## 2015-06-20 NOTE — Progress Notes (Signed)
Occupational Therapy Session Note  Patient Details  Name: Juan Cameron MRN: [id number] Date of Birth: 1975-12-18  Today's Date: 06/20/2015 OT Individual Time: [id number] OT Individual Time Calculation (min): 43 min    Skilled Therapeutic Interventions/Progress Updates:   Pt donned shoes sitting EOB with setup only.  Oxygen sats at 98% on 3Ls with HR elevated into the 130s in sitting.  Pt difficult to participate in session but finally persuaded him to ambulate out of the room.  Therapist applying gait belt as he is not familiar with pt's mobility.  Pt very resistant to this stating he didn't need it.  Attempted to justify need and then state it would be removed if he did well.  Juan Cameron ambulated from the room to the nurses station to say hey to staff and then to the therapy gym.  Oxygen sats decreased to 84% with dyspnea at 3/4 level.  HR increased into the low 140s once he was seated.  Approximately 1 min of rest needed to reduce HR into the mid 130s with O2 Sats increasing to 91%.  Pt ambulated to the ortho gym as well and practiced walking up the steps with supervision using rails.  Pt reluctant to answer any of therapist's questions or participate in any activities except mobility during this session.  Majority of time spent persuading pt to engage at all.  Returned to room at end of session with pt left sitting EOB and call button in reach and bed alarm activated.    Therapy Documentation Precautions:  Precautions Precautions: Fall Precaution Comments: 3L/min supplemental oxygen via nasal cannula Restrictions Weight Bearing Restrictions: No  Pain: Pain Assessment Pain Assessment: No/denies pain ADL: See Function Navigator for Current Functional Status.   Therapy/Group: Individual Therapy  Karianna Gusman OTR/L 06/20/2015, 3:59 PM

## 2015-06-20 NOTE — Progress Notes (Signed)
Occupational Therapy Session Note  Patient Details  Name: Juan Cameron MRN: [id number] Date of Birth: 31-Mar-1975  Today's Date: 06/20/2015 OT Individual Time: 1030-1150 OT Individual Time Calculation (min): 80 min    Short Term Goals: Week 1:  OT Short Term Goal 1 (Week 1): Pt will engage in 10 minutes of functional tasks with 2 or less rest breaks. OT Short Term Goal 1 - Progress (Week 1): Met OT Short Term Goal 2 (Week 1): Pt will perform LB dressing with min A in order to increase I in self care. OT Short Term Goal 2 - Progress (Week 1): Met OT Short Term Goal 3 (Week 1): Pt willl perform clothing managemenmt with contact guard for balance after toileting in order to increase balance with functional tasks. OT Short Term Goal 3 - Progress (Week 1): Met  Skilled Therapeutic Interventions/Progress Updates:    1:1 self care retraining at shower level. With focus on functional mobility around room with RW with supervision with a for management of O2 tubing. Pt sats were 96% on 3 liters with HR 100s when began session. Pt very nervous and demonstrates anxious behavior about touching the floor or equipment with bare feet. Pt able to bathe with supervision with min VC for thoroughness. Pt ambulated back out to EOB to dress with setup and with extra time. Pt with continued elevated HR the entire rest of session despite numerous and lengthy rest breaks. Pt Hr was 145 after shower with O2 at 88% on 3 liters but could recover to above 90% more quickly than HR would return near 100. Pt participated in home making skills around the room with encouragement and motivated by music with supervision for standing tasks for activity tolerance.   Therapy Documentation Precautions:  Precautions Precautions: Fall Precaution Comments: 3L/min supplemental oxygen via nasal cannula Restrictions Weight Bearing Restrictions: No  Vital Signs: Therapy Vitals Pulse Rate: (!) 144 (after activity ) Oxygen  Therapy SpO2: 97 % O2 Device: Nasal Cannula O2 Flow Rate (L/min): 3 L/min Pain: Pain Assessment Pain Assessment: No/denies pain Pain Score: 0-No pain  See Function Navigator for Current Functional Status.   Therapy/Group: Individual Therapy  Willeen Cass [facility] 06/20/2015, 12:02 PM

## 2015-06-20 NOTE — Progress Notes (Signed)
Social Work Patient ID: Alan Ripperobert Chowning, male   DOB: Oct 14, 1975, 40 y.o.   MRN: [id number] Spoke with [name]-sister to inform of team conference progressing toward his goals and the plan to discharge still on Tuesday 6/13. She wanted a handicapped application for their car. Discussed walker and wheelchair and how Medicare will only pay for one. He had no equipment prior to admission. She had other questions but needed to call this worker back due to being at work. Will await her return call.

## 2015-06-20 NOTE — Progress Notes (Signed)
[place]ICINE & REHABILITATION     PROGRESS NOTE  Subjective/Complaints:  Sitting in bed.    No new issues. Offers little  ROS: Appears to denies CP, SOB, nausea, vomiting, diarrhea, however limited communication.  Objective: Vital Signs: Blood pressure 121/87, pulse 121, temperature 98.4 F (36.9 C), temperature source Oral, resp. rate 20, weight 89.132 kg (196 lb 8 oz), SpO2 99 %. No results found.  Recent Labs  06/19/15 0510 06/20/15 0529  WBC 13.4* 13.9*  HGB 8.9* 8.9*  HCT 28.2* 28.1*  PLT 225 225   No results for input(s): NA, K, CL, GLUCOSE, BUN, CREATININE, CALCIUM in the last 72 hours.  Invalid input(s): CO CBG (last 3)  No results for input(s): GLUCAP in the last 72 hours.  Wt Readings from Last 3 Encounters:  06/20/15 89.132 kg (196 lb 8 oz)  06/10/15 84.1 kg (185 lb 6.5 oz)  05/16/15 91.536 kg (201 lb 12.8 oz)    Physical Exam:  BP 121/87 mmHg  Pulse 121  Temp(Src) 98.4 F (36.9 C) (Oral)  Resp 20  Wt 89.132 kg (196 lb 8 oz)  SpO2 99% Gen: NAD. Well-developed, well-nourished HENT: trach site healed. Normocephalic.  Eyes: Conjunctivae and EOM are normal.  Cardiovascular: Normal rate and regular rhythm  Respiratory: Effort normal. No respiratory distress GI: Soft. Bowel sounds are normal. He exhibits no distension Neurological. Patient was alert. Followed simple commands occasionally. Moving all 4 extremities without difficulty, likely 4/5 b/l UE, and 4/5 b/l LE (?effort).   Skin. Warm and dry. Psych: pt very flat  Assessment/Plan: 1. Functional deficits secondary to respiratory failure/MRSA pneumonia which require 3+ hours per day of interdisciplinary therapy in a comprehensive inpatient rehab setting. Physiatrist is providing close team supervision and 24 hour management of active medical problems listed below. Physiatrist and rehab team continue to assess barriers to discharge/monitor patient progress toward functional and  medical goals.  Function:  Bathing Bathing position   Position: Wheelchair/chair at sink  Bathing parts Body parts bathed by patient: Right arm, Left arm, Abdomen, Chest, Front perineal area, Right upper leg, Left upper leg, Right lower leg, Left lower leg, Buttocks Body parts bathed by helper: Back  Bathing assist Assist Level: Supervision or verbal cues   Set up : To obtain items  Upper Body Dressing/Undressing Upper body dressing   What is the patient wearing?: Pull over shirt/dress     Pull over shirt/dress - Perfomed by patient: Thread/unthread right sleeve, Thread/unthread left sleeve, Put head through opening, Pull shirt over trunk          Upper body assist Assist Level: Set up   Set up : To obtain clothing/put away  Lower Body Dressing/Undressing Lower body dressing   What is the patient wearing?: Underwear, Pants, Shoes, [employer] - Performed by patient: Thread/unthread right underwear leg, Thread/unthread left underwear leg, Pull underwear up/down   Pants- Performed by patient: Thread/unthread right pants leg, Thread/unthread left pants leg, Pull pants up/down, Fasten/unfasten pants   Non-skid slipper socks- Performed by patient: Don/doff left sock, Don/doff right sock       Shoes - Performed by patient: Don/doff right shoe, Don/doff left shoe, Fasten right, Fasten left Shoes - Performed by helper: Fasten right, Fasten left       TED Hose - Performed by helper: Don/doff right TED hose, Don/doff left TED hose  Lower body assist Assist for lower body dressing: Set up, Supervision or verbal cues   Set up : To obtain  clothing/put away  Toileting Toileting Toileting activity did not occur: N/A Toileting steps completed by patient: Performs perineal hygiene, Adjust clothing after toileting, Adjust clothing prior to toileting Toileting steps completed by helper: Adjust clothing after toileting Toileting Assistive Devices: Grab bar or rail  Toileting  assist Assist level: Supervision or verbal cues   Transfers Chair/bed transfer Chair/bed transfer activity did not occur: N/A Chair/bed transfer method: Ambulatory Chair/bed transfer assist level: Supervision or verbal cues Chair/bed transfer assistive device: Patent attorneyWalker     Locomotion Ambulation     Max distance: [id number] Assist level: Supervision or verbal cues   Wheelchair   Type: Manual Max wheelchair distance: 6175ft Assist Level: Supervision or verbal cues  Cognition Comprehension Comprehension assist level: Understands basic 75 - 89% of the time/ requires cueing 10 - 24% of the time  Expression Expression assist level: Expresses basic 50 - 74% of the time/requires cueing 25 - 49% of the time. Needs to repeat parts of sentences.  Social Interaction Social Interaction assist level: Interacts appropriately 75 - 89% of the time - Needs redirection for appropriate language or to initiate interaction.  Problem Solving Problem solving assist level: Solves basic 50 - 74% of the time/requires cueing 25 - 49% of the time  Memory Memory assist level: Recognizes or recalls 50 - 74% of the time/requires cueing 25 - 49% of the time    Medical Problem List and Plan: 1. Debilitation secondary to respiratory failure/MRSA pneumonia.   Contact precautions. All antibiotic therapy is completed.   -pt is medically stable for discharge  Taper of prednisone over 2 weeks, began on 6/1 2. DVT Prophylaxis/Anticoagulation: Subcutaneous heparin. Monitor for any bleeding episodes 3. Pain Management: Tylenol as needed 4. Mood/severe autism: Klonopin 0.5 mg daily at bedtime, Zyprexa 5 mg twice a day. Patient appears to be at his baseline per family 5. Neuropsych: This patient is not capable of making decisions on his own behalf. 6. Skin/Wound Care: Routine skin checks 7. Fluids/Electrolytes/Nutrition: Routine I&O's 8. Dysphagia. Advanced to regular diet on 5/31.   Monitor for any signs of aspiration 9. Acute  on chronic anemia. Continue iron supplement.   Hb 9.6 on 6/6  Hemoccult stools remain pending---still not done 10. History of sickle cell trait. 11. Leukocytosis: Afebrile  - secondary to steroids  WBCs 16.8 on 6/6 ---15.9---now 13.9----do not need daily wbc's  CXR on 6/2 reviewed, likely atelectasis  ucx on 6/5 without sig growth  LOS (Days) 10 A FACE TO FACE EVALUATION WAS PERFORMED  Derian Dimalanta T 06/20/2015 10:30 AM

## 2015-06-20 NOTE — Progress Notes (Signed)
Physical Therapy Session Note  Patient Details  Name: Juan Cameron MRN: [id number] Date of Birth: Apr 14, 1975  Today's Date: 06/20/2015 PT Individual Time:   904-958 Calculated Individual treatment time: 81 min   Short Term Goals: Week 1:  PT Short Term Goal 1 (Week 1): Pt will negotiate 4 steps with 1 rail with steady A. PT Short Term Goal 1 - Progress (Week 1): Progressing toward goal PT Short Term Goal 2 (Week 1): Pt will ambulate 50 ft with LRAD & Min A.  PT Short Term Goal 2 - Progress (Week 1): Met PT Short Term Goal 3 (Week 1): Pt will tolerate 15 consecutive minutes of therapy while maintaining >90% SpO2. PT Short Term Goal 3 - Progress (Week 1): Progressing toward goal Week 2:  PT Short Term Goal 1 (Week 2): STG = LTG due to anticipated d/c date.  Skilled Therapeutic Interventions/Progress Updates:    Patient received supine in bed and agreeable to PT.  Patient performed bed mobility with roll to L and supine>sit without cues or instruction from PT.   Patient donned pants with supervision A from PT with with supervision A for sit<>stand to pull pants to waist without AD.   Patient performed gait training with RW for 119f x 2 with supervision A with min cues to maintain BUE on RW for increased safety. Gait training without AD for 1510fwith supervision A from PT. Patient noted to have slight trendelenburg gait on the R LE without UE support on RW.   Patient performed sitting and standing balance activity while playing Wii bowling with sit<>stand x 7 with supervision A without AD and self generated anterior/posterior and rotational pertubations for use of Wii controller. PT provided supervision A for increased safety and min cues for improved control of stand>sit to mat table.   Throughout PT patient was kept on 2L supplemental O2 via nasal canula. Following each bout of gait training patient SpO2 desat to 84% and required approx 1 minute of pursed lip breathing to return to >90%    Patient left sitting EOB with bed alarm on and call bell within reach.   Therapy Documentation Precautions:  Precautions Precautions: Fall Precaution Comments: 3L/min supplemental oxygen via nasal cannula Restrictions Weight Bearing Restrictions: No General:   Vital Signs: Therapy Vitals Temp: 98.4 F (36.9 C) Temp Source: Oral Pulse Rate: (!) 121 Resp: 20 BP: 121/87 mmHg Patient Position (if appropriate): Sitting Oxygen Therapy SpO2: 99 % O2 Device: Not Delivered   See Function Navigator for Current Functional Status.   Therapy/Group: Individual Therapy  AuLorie Phenix/09/2015, 7:39 AM

## 2015-06-20 NOTE — Progress Notes (Signed)
Patient rested well throughout the night. Assisted lab to obtain blood draw from patient this AM. Refused AM dose of heparin following blood draw, stating "no more needles!" Per patient's sister/day shift RN, patient had bowel movement yesterday. No acute distress noted.

## 2015-06-20 NOTE — Progress Notes (Signed)
Social Work Patient ID: Juan Cameron, male   DOB: [birth year], 40 y.o.   MRN: [id number] Spoken with sister-Juan Cameron to discuss education can be here Sat at 10:30 to attend therapies with him. She will look for rolling walker due to insurance will only cover a wheelchair or a walker, not both. [facility] for 48 hours prior to discharge so intake can be set up. She is still working on caregivers while she is working. Will touch base on Monday to see if further questions or concerns.

## 2015-06-20 NOTE — Progress Notes (Signed)
Social Work Elease Hashimoto, [place] Social Worker Signed  Patient Care Conference 06/19/2015 10:48 AM    Expand All Collapse All   Inpatient RehabilitationTeam Conference and Plan of Care Update Date: 06/19/2015   Time: 8:10 am     Patient Name: Juan Cameron       Medical Record Number: [id number]  Date of Birth: 04-20-1975 Sex: Male         Room/Bed: 4W18C/4W18C-01 Payor Info: Payor: MEDICARE / Plan: MEDICARE PART A AND B / Product Type: *No Product type* /    Admitting Diagnosis: Debility   Admit Date/Time:  06/10/2015  5:55 PM Admission Comments: No comment available   Primary Diagnosis:  Debility Principal Problem: Debility    Patient Active Problem List     Diagnosis  Date Noted   .  Dysphagia     .  Acute blood loss anemia     .  Anemia of chronic disease     .  Respiratory failure ([place])  06/10/2015   .  Acute on chronic respiratory failure with hypoxia ([place])     .  Chronic diastolic CHF (congestive heart failure) ([place])     .  Sinus tachycardia ([place])     .  Acute renal failure ([place])     .  Sickle cell trait ([place])     .  Essential hypertension     .  SOB (shortness of breath)     .  Pneumonia     .  Acute pulmonary edema (HCC)     .  Hypoxemia     .  Sepsis ([place])     .  Cough     .  Right knee pain     .  Cognitive deficits     .  Tachycardia     .  Debility  05/06/2015   .  Encephalopathy  05/06/2015   .  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  Ventilator dependence ([place])  04/08/2015   .  Pressure ulcer  04/05/2015   .  Acute respiratory failure ([place])     .  Fever  03/21/2015   .  Acute renal insufficiency  03/21/2015   .  Obesity  03/21/2015   .  ARDS (adult respiratory distress syndrome) ([place])     .  HCAP (healthcare-associated pneumonia)  03/11/2015   .  Leukocytosis  03/11/2015   .  Acute hyperglycemia  03/11/2015   .  Left ventricular diastolic dysfunction, NYHA class 2  03/11/2015   .  Thrombocytopenia ([place])  03/11/2015   .  Acute lung injury   03/10/2015   .  Acute respiratory failure with hypoxia ([place])  02/28/2015   .  CAP (community acquired pneumonia)  02/28/2015   .  Hypokalemia  02/28/2015   .  Autism  02/28/2015     Expected Discharge Date: Expected Discharge Date: 06/24/15  Team Members Present: Physician leading conference: Dr. Alger Simons Social Worker Present: Ovidio Kin, LCSW Nurse Present: Dorien Chihuahua, RN PT Present: Barrie Folk, PT OT Present: Willeen Cass, OT SLP Present: Weldon Inches, SLP;Nicole Page, SLP PPS Coordinator present : Ileana Ladd, PT        Current Status/Progress  Goal  Weekly Team Focus   Medical     wbc's falling (steroid effect). steroids tapering. oxygen status stable  finalize medicaly for discharge  monitor blood count, oxygen   Bowel/Bladder     LBM 06/17/15  Patient independent with urinal but spills urinal  in bed. Continent of bowel and bladder (frequency and urgency)   continent of bowel and bladder  coninue to monitor q shift   Swallow/Nutrition/ Hydration     regular  regular  met   ADL's     supervision to min A for self care tasks, require rest breaks due to decr activity toleance/ endurance and requiring supplumental O2  supervision overall  activity tolerance, self care retraining, safety with functional mobility   Mobility     ~120 ft with RW & minA/supervision A, fair activity tolerance & poor motivation, min A for stair negotiation with B rails, BUE support on RW for balance  mod I transfers & bed mobility, supervision for ambulation with LRAD, supervision to negotiate 3 steps without rails   family & pt education, endurance training & improving activity tolerance, gait training, stair training    Communication     min A for safe voicing  min A for safe voicing  family education   Safety/Cognition/ Behavioral Observations    no unsafe behavior/ calls for assistance  min assist  continue to monitor q shift   Pain     no complaints of pain  Pain less than or  equal to 2  assess and treate pain q ahift   Skin     stage II healing  Patient with no skin breakdown this admission   continue to monitor q shift      *See Care Plan and progress notes for long and short-term goals.    Barriers to Discharge:  MS, buy-in     Possible Resolutions to Barriers:   continue to engage     Discharge Planning/Teaching Needs:   Home with sister who is arranging 24 hr supervision via family and PCS along with getting on CAP waiting list.        Team Discussion:    Reaching his goals of supervision level. Trying to advance diet to regular currently Dys 3-thin. Balance issues-at times difficult to participate in therapies, would rather watch tv or play his games. Will require home O2. No further SP follow up. Sister working on 24 hr care at discharge   Revisions to Treatment Plan:    None    Continued Need for Acute Rehabilitation Level of Care: The patient requires daily medical management by a physician with specialized training in physical medicine and rehabilitation for the following conditions: Daily direction of a multidisciplinary physical rehabilitation program to ensure safe treatment while eliciting the highest outcome that is of practical value to the patient.: Yes Daily medical management of patient stability for increased activity during participation in an intensive rehabilitation regime.: Yes Daily analysis of laboratory values and/or radiology reports with any subsequent need for medication adjustment of medical intervention for : Pulmonary problems;Mood/behavior problems  Elease Hashimoto 06/20/2015, 8:46 AM                 Elease Hashimoto, LCSW Social Worker Signed  Patient Care Conference 06/12/2015  8:53 AM    Expand All Collapse All   Inpatient RehabilitationTeam Conference and Plan of Care Update Date: 06/11/2015   Time: 2:30 PM     Patient Name: Juan Cameron       Medical Record Number: [id number]  Date of Birth: Apr 07, 1975 Sex:  Male         Room/Bed: 4W18C/4W18C-01 Payor Info: Payor: MEDICARE / Plan: MEDICARE PART A AND B / Product Type: *No Product type* /    Admitting Diagnosis: Debility  Admit Date/Time:  06/10/2015  5:55 PM Admission Comments: No comment available   Primary Diagnosis:  Debility Principal Problem: Debility    Patient Active Problem List     Diagnosis  Date Noted   .  Dysphagia     .  Acute blood loss anemia     .  Anemia of chronic disease     .  Respiratory failure ([place])  06/10/2015   .  Acute on chronic respiratory failure with hypoxia ([place])     .  Chronic diastolic CHF (congestive heart failure) ([place])     .  Sinus tachycardia ([place])     .  Acute renal failure ([place])     .  Sickle cell trait ([place])     .  Essential hypertension     .  SOB (shortness of breath)     .  Pneumonia     .  Acute pulmonary edema (HCC)     .  Hypoxemia     .  Sepsis ([place])     .  Cough     .  Right knee pain     .  Cognitive deficits     .  Tachycardia     .  Debility  05/06/2015   .  Encephalopathy  05/06/2015   .  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  Ventilator dependence ([place])  04/08/2015   .  Pressure ulcer  04/05/2015   .  Acute respiratory failure ([place])     .  Fever  03/21/2015   .  Acute renal insufficiency  03/21/2015   .  Obesity  03/21/2015   .  ARDS (adult respiratory distress syndrome) ([place])     .  HCAP (healthcare-associated pneumonia)  03/11/2015   .  Leukocytosis  03/11/2015   .  Acute hyperglycemia  03/11/2015   .  Left ventricular diastolic dysfunction, NYHA class 2  03/11/2015   .  Thrombocytopenia ([place])  03/11/2015   .  Acute lung injury  03/10/2015   .  Acute respiratory failure with hypoxia ([place])  02/28/2015   .  CAP (community acquired pneumonia)  02/28/2015   .  Hypokalemia  02/28/2015   .  Autism  02/28/2015     Expected Discharge Date: Expected Discharge Date: 06/24/15  Team Members Present: Physician leading conference: Dr. Delice Lesch Social Worker  Present: Ovidio Kin, LCSW Nurse Present: Junius Creamer, RN PT Present: Barrie Folk, PT;Victoria Sabra Heck, Dustin Folks, PT OT Present: Benay Pillow, OT SLP Present: Weston Anna, SLP PPS Coordinator present : Daiva Nakayama, RN, CRRN        Current Status/Progress  Goal  Weekly Team Focus   Medical     Debilitation secondary to respiratory failure/MRSA pneumonia   Leukocytosis, hypokalemia  See above   Bowel/Bladder     Continent of Bowel LBM 5/29; Foely cath   Continent of Bowel abd Bladder  Monitor Toileting need Q shift and as needed    Swallow/Nutrition/ Hydration     Dys. 3 textures with thin liquids  TBD  TBD   ADL's     min A overall with self care tasks from wheelchair, min A stand pivot transfers, multiple rest breaks secondary to poor endurance   supervision overall  activity tolerance, self care retraining, functional transfers, safety   Mobility     mod a to ambulate 15 ft without AD, min A for transfers, mod a for w/c mobility, very poor endurance  supervision for ambulation &  transfers with LRAD, supervision for stair negotiation  endurance training, gait training, balance training, stair negotiation   Communication               Safety/Cognition/ Behavioral Observations              Pain     Denied any pain or discomfort  <3 on the scale 0-10  Assess and treate pain Q shift and as needed    Skin     Patient has stage II on the bottm  Patient to skin to be free from infection/breakdwon  monitor skin Q shift and as needed       *See Care Plan and progress notes for long and short-term goals.    Barriers to Discharge:  Autism, MR, leukocytosis, hypokalemia, foley      Possible Resolutions to Barriers:   Follow labs, d/c foley     Discharge Planning/Teaching Needs:     Home with sister who will need to arrange 24 hr supervision for pt. She continues to be concerned about his medical issues and is now concerned about his white count-MD to address.       Team Discussion:    Goals-supervision level. Has balance and endurance issues from long hospitalization. Currently on 3 liters of O2. Impulsive at times and needs to slow down when eating on Dys 3 diet. Sister concerned about increasing whilte count, will address with MD.   Revisions to Treatment Plan:    New eval    Continued Need for Acute Rehabilitation Level of Care: The patient requires daily medical management by a physician with specialized training in physical medicine and rehabilitation for the following conditions: Daily direction of a multidisciplinary physical rehabilitation program to ensure safe treatment while eliciting the highest outcome that is of practical value to the patient.: Yes Daily medical management of patient stability for increased activity during participation in an intensive rehabilitation regime.: Yes Daily analysis of laboratory values and/or radiology reports with any subsequent need for medication adjustment of medical intervention for : Other;Pulmonary problems;Mood/behavior problems  Elease Hashimoto 06/12/2015, 8:53 AM                 Elease Hashimoto, LCSW Social Worker Signed  Patient Care Conference 05/14/2015  3:45 PM    Expand All Collapse All   Inpatient RehabilitationTeam Conference and Plan of Care Update Date: 05/14/2015   Time: 2:00 PM     Patient Name: Juan Cameron       Medical Record Number: [id number]  Date of Birth: 09-14-1975 Sex: Male         Room/Bed: 4W17C/4W17C-01 Payor Info: Payor: MEDICARE / Plan: MEDICARE PART A AND B / Product Type: *No Product type* /    Admitting Diagnosis: Debility   Admit Date/Time:  05/06/2015  5:22 PM Admission Comments: No comment available   Primary Diagnosis:  Debility Principal Problem: Debility    Patient Active Problem List     Diagnosis  Date Noted   .  Sepsis ([place])     .  Cough     .  Right knee pain     .  Cognitive deficits     .  Tachycardia     .  Debility  05/06/2015   .   Encephalopathy  05/06/2015   .  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  Ventilator dependence ([place])  04/08/2015   .  Pressure ulcer  04/05/2015   .  Acute respiratory failure (  [place])     .  Fever  03/21/2015   .  Acute renal insufficiency  03/21/2015   .  Obesity  03/21/2015   .  ARDS (adult respiratory distress syndrome) ([place])     .  HCAP (healthcare-associated pneumonia)  03/11/2015   .  Leukocytosis  03/11/2015   .  Acute hyperglycemia  03/11/2015   .  Left ventricular diastolic dysfunction, NYHA class 2  03/11/2015   .  Thrombocytopenia ([place])  03/11/2015   .  Acute lung injury  03/10/2015   .  Acute respiratory failure with hypoxia ([place])  02/28/2015   .  CAP (community acquired pneumonia)  02/28/2015   .  Hypokalemia  02/28/2015   .  Autism  02/28/2015     Expected Discharge Date: Expected Discharge Date: 05/21/15  Team Members Present: Physician leading conference: Dr. Delice Lesch Social Worker Present: Ovidio Kin, LCSW Nurse Present: Dorien Chihuahua, RN PT Present: Other (comment) Lavone Nian & Rory Percy) OT Present: Willeen Cass, OT SLP Present: Windell Moulding, SLP PPS Coordinator present : Daiva Nakayama, RN, CRRN        Current Status/Progress  Goal  Weekly Team Focus   Medical     Debilitation secondary to respiratory failure/ARDS/multilobar pneumonia with ?PNA  Improve cough, leukocytosis, endurance, mobility   see above   Bowel/Bladder     continent of bowel and bladder/ able to use urinal indep but spills at times/LBM 05/12/15  continent of bowel and bladder  cont. of monitor q shift   Swallow/Nutrition/ Hydration               ADL's     Supervision for BADL and transfers   Supervision for BADL, transfers, and dynamic standing balance   Improved activity tolerance, transfers, sit<>stand, dynamic standing balance, BUE therex   Mobility     supervision for bed mobility & transfers, steady A with RW for gait for maximumof 175 ft but averages 40 ft  with max encouragement, stairs with Min A & B rails  supervision for standing balance, Mod I bed mobility, supervision transfers & ambulation with LRAD, 3 stairs without rails & supervision  endurance training, gait & stair training, balance training    Communication               Safety/Cognition/ Behavioral Observations    no unsafe behavior/calls for assistance   min assist  continue to monitor q shift   Pain     no complaints of pain  pain less than or equal to 2  continue to monitor q shift   Skin     no skin breakdown this admission/rash to face-hydrocodone cream to area  min assistance  continue to monitor q shift      *See Care Plan and progress notes for long and short-term goals.    Barriers to Discharge:  Autism, MR, tachy, leukocytosis     Possible Resolutions to Barriers:   Follow labs, improve conditioning and HR, Pulm consult      Discharge Planning/Teaching Needs:   Home with sister she is trying to figure out care for pt until he can return to adult day care. Wants to keep home until fully recovered        Team Discussion:    Goals supervision level-medical issues-pneumonia called in pulmonary and will have swallow eval. Currently on 3 l of O2 to sustain 88-90%. BP issues also. Very deconditioned. Sister wants to be kept updated regarding his medical issues since  she is his guardian. Had improved in his activity tolerance before this issue   Revisions to Treatment Plan:    Medical issues    Continued Need for Acute Rehabilitation Level of Care: The patient requires daily medical management by a physician with specialized training in physical medicine and rehabilitation for the following conditions: Daily direction of a multidisciplinary physical rehabilitation program to ensure safe treatment while eliciting the highest outcome that is of practical value to the patient.: Yes Daily medical management of patient stability for increased activity during participation in  an intensive rehabilitation regime.: Yes Daily analysis of laboratory values and/or radiology reports with any subsequent need for medication adjustment of medical intervention for : Other;Pulmonary problems;Mood/behavior problems  Elease Hashimoto 05/14/2015, 3:45 PM                 Elease Hashimoto, [place] Social Worker Signed  Patient Care Conference 06/19/2015 10:48 AM    Expand All Collapse All   Inpatient RehabilitationTeam Conference and Plan of Care Update Date: 06/19/2015   Time: 8:10 am     Patient Name: Juan Cameron       Medical Record Number: [id number]  Date of Birth: 09-Jul-1975 Sex: Male         Room/Bed: 4W18C/4W18C-01 Payor Info: Payor: MEDICARE / Plan: MEDICARE PART A AND B / Product Type: *No Product type* /    Admitting Diagnosis: Debility   Admit Date/Time:  06/10/2015  5:55 PM Admission Comments: No comment available   Primary Diagnosis:  Debility Principal Problem: Debility    Patient Active Problem List     Diagnosis  Date Noted   .  Dysphagia     .  Acute blood loss anemia     .  Anemia of chronic disease     .  Respiratory failure ([place])  06/10/2015   .  Acute on chronic respiratory failure with hypoxia ([place])     .  Chronic diastolic CHF (congestive heart failure) ([place])     .  Sinus tachycardia ([place])     .  Acute renal failure ([place])     .  Sickle cell trait ([place])     .  Essential hypertension     .  SOB (shortness of breath)     .  Pneumonia     .  Acute pulmonary edema (HCC)     .  Hypoxemia     .  Sepsis ([place])     .  Cough     .  Right knee pain     .  Cognitive deficits     .  Tachycardia     .  Debility  05/06/2015   .  Encephalopathy  05/06/2015   .  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  Ventilator dependence ([place])  04/08/2015   .  Pressure ulcer  04/05/2015   .  Acute respiratory failure ([place])     .  Fever  03/21/2015   .  Acute renal insufficiency  03/21/2015   .  Obesity  03/21/2015   .  ARDS (adult respiratory distress  syndrome) ([place])     .  HCAP (healthcare-associated pneumonia)  03/11/2015   .  Leukocytosis  03/11/2015   .  Acute hyperglycemia  03/11/2015   .  Left ventricular diastolic dysfunction, NYHA class 2  03/11/2015   .  Thrombocytopenia ([place])  03/11/2015   .  Acute lung injury  03/10/2015   .  Acute respiratory  failure with hypoxia ([place])  02/28/2015   .  CAP (community acquired pneumonia)  02/28/2015   .  Hypokalemia  02/28/2015   .  Autism  02/28/2015     Expected Discharge Date: Expected Discharge Date: 06/24/15  Team Members Present: Physician leading conference: Dr. Alger Simons Social Worker Present: Ovidio Kin, LCSW Nurse Present: Dorien Chihuahua, RN PT Present: Barrie Folk, PT OT Present: Willeen Cass, OT SLP Present: Weldon Inches, SLP;Nicole Page, SLP PPS Coordinator present : Ileana Ladd, PT        Current Status/Progress  Goal  Weekly Team Focus   Medical     wbc's falling (steroid effect). steroids tapering. oxygen status stable  finalize medicaly for discharge  monitor blood count, oxygen   Bowel/Bladder     LBM 06/17/15  Patient independent with urinal but spills urinal in bed. Continent of bowel and bladder (frequency and urgency)   continent of bowel and bladder  coninue to monitor q shift   Swallow/Nutrition/ Hydration     regular  regular  met   ADL's     supervision to min A for self care tasks, require rest breaks due to decr activity toleance/ endurance and requiring supplumental O2  supervision overall  activity tolerance, self care retraining, safety with functional mobility   Mobility     ~120 ft with RW & minA/supervision A, fair activity tolerance & poor motivation, min A for stair negotiation with B rails, BUE support on RW for balance  mod I transfers & bed mobility, supervision for ambulation with LRAD, supervision to negotiate 3 steps without rails   family & pt education, endurance training & improving activity tolerance, gait training, stair  training    Communication     min A for safe voicing  min A for safe voicing  family education   Safety/Cognition/ Behavioral Observations    no unsafe behavior/ calls for assistance  min assist  continue to monitor q shift   Pain     no complaints of pain  Pain less than or equal to 2  assess and treate pain q ahift   Skin     stage II healing  Patient with no skin breakdown this admission   continue to monitor q shift      *See Care Plan and progress notes for long and short-term goals.    Barriers to Discharge:  MS, buy-in     Possible Resolutions to Barriers:   continue to engage     Discharge Planning/Teaching Needs:   Home with sister who is arranging 24 hr supervision via family and PCS along with getting on CAP waiting list.        Team Discussion:    Reaching his goals of supervision level. Trying to advance diet to regular currently Dys 3-thin. Balance issues-at times difficult to participate in therapies, would rather watch tv or play his games. Will require home O2. No further SP follow up. Sister working on 24 hr care at discharge   Revisions to Treatment Plan:    None    Continued Need for Acute Rehabilitation Level of Care: The patient requires daily medical management by a physician with specialized training in physical medicine and rehabilitation for the following conditions: Daily direction of a multidisciplinary physical rehabilitation program to ensure safe treatment while eliciting the highest outcome that is of practical value to the patient.: Yes Daily medical management of patient stability for increased activity during participation in an intensive rehabilitation regime.: Yes Daily  analysis of laboratory values and/or radiology reports with any subsequent need for medication adjustment of medical intervention for : Pulmonary problems;Mood/behavior problems  Elease Hashimoto 06/20/2015, 8:46 AM                 Elease Hashimoto, LCSW Social Worker  Signed  Patient Care Conference 06/12/2015  8:53 AM    Expand All Collapse All   Inpatient RehabilitationTeam Conference and Plan of Care Update Date: 06/11/2015   Time: 2:30 PM     Patient Name: Juan Cameron       Medical Record Number: [id number]  Date of Birth: 1975-08-14 Sex: Male         Room/Bed: 4W18C/4W18C-01 Payor Info: Payor: MEDICARE / Plan: MEDICARE PART A AND B / Product Type: *No Product type* /    Admitting Diagnosis: Debility   Admit Date/Time:  06/10/2015  5:55 PM Admission Comments: No comment available   Primary Diagnosis:  Debility Principal Problem: Debility    Patient Active Problem List     Diagnosis  Date Noted   .  Dysphagia     .  Acute blood loss anemia     .  Anemia of chronic disease     .  Respiratory failure ([place])  06/10/2015   .  Acute on chronic respiratory failure with hypoxia ([place])     .  Chronic diastolic CHF (congestive heart failure) ([place])     .  Sinus tachycardia ([place])     .  Acute renal failure ([place])     .  Sickle cell trait ([place])     .  Essential hypertension     .  SOB (shortness of breath)     .  Pneumonia     .  Acute pulmonary edema (HCC)     .  Hypoxemia     .  Sepsis ([place])     .  Cough     .  Right knee pain     .  Cognitive deficits     .  Tachycardia     .  Debility  05/06/2015   .  Encephalopathy  05/06/2015   .  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  Ventilator dependence ([place])  04/08/2015   .  Pressure ulcer  04/05/2015   .  Acute respiratory failure ([place])     .  Fever  03/21/2015   .  Acute renal insufficiency  03/21/2015   .  Obesity  03/21/2015   .  ARDS (adult respiratory distress syndrome) ([place])     .  HCAP (healthcare-associated pneumonia)  03/11/2015   .  Leukocytosis  03/11/2015   .  Acute hyperglycemia  03/11/2015   .  Left ventricular diastolic dysfunction, NYHA class 2  03/11/2015   .  Thrombocytopenia ([place])  03/11/2015   .  Acute lung injury  03/10/2015   .  Acute respiratory failure with  hypoxia ([place])  02/28/2015   .  CAP (community acquired pneumonia)  02/28/2015   .  Hypokalemia  02/28/2015   .  Autism  02/28/2015     Expected Discharge Date: Expected Discharge Date: 06/24/15  Team Members Present: Physician leading conference: Dr. Delice Lesch Social Worker Present: Ovidio Kin, LCSW Nurse Present: Junius Creamer, RN PT Present: Barrie Folk, PT;Victoria Sabra Heck, Dustin Folks, PT OT Present: Benay Pillow, OT SLP Present: Weston Anna, SLP PPS Coordinator present : Daiva Nakayama, RN, CRRN        Current Status/Progress  Goal  Weekly Team Focus   Medical     Debilitation secondary to respiratory failure/MRSA pneumonia   Leukocytosis, hypokalemia  See above   Bowel/Bladder     Continent of Bowel LBM 5/29; Foely cath   Continent of Bowel abd Bladder  Monitor Toileting need Q shift and as needed    Swallow/Nutrition/ Hydration     Dys. 3 textures with thin liquids  TBD  TBD   ADL's     min A overall with self care tasks from wheelchair, min A stand pivot transfers, multiple rest breaks secondary to poor endurance   supervision overall  activity tolerance, self care retraining, functional transfers, safety   Mobility     mod a to ambulate 15 ft without AD, min A for transfers, mod a for w/c mobility, very poor endurance  supervision for ambulation & transfers with LRAD, supervision for stair negotiation  endurance training, gait training, balance training, stair negotiation   Communication               Safety/Cognition/ Behavioral Observations              Pain     Denied any pain or discomfort  <3 on the scale 0-10  Assess and treate pain Q shift and as needed    Skin     Patient has stage II on the bottm  Patient to skin to be free from infection/breakdwon  monitor skin Q shift and as needed       *See Care Plan and progress notes for long and short-term goals.    Barriers to Discharge:  Autism, MR, leukocytosis, hypokalemia, foley      Possible  Resolutions to Barriers:   Follow labs, d/c foley     Discharge Planning/Teaching Needs:     Home with sister who will need to arrange 24 hr supervision for pt. She continues to be concerned about his medical issues and is now concerned about his white count-MD to address.      Team Discussion:    Goals-supervision level. Has balance and endurance issues from long hospitalization. Currently on 3 liters of O2. Impulsive at times and needs to slow down when eating on Dys 3 diet. Sister concerned about increasing whilte count, will address with MD.   Revisions to Treatment Plan:    New eval    Continued Need for Acute Rehabilitation Level of Care: The patient requires daily medical management by a physician with specialized training in physical medicine and rehabilitation for the following conditions: Daily direction of a multidisciplinary physical rehabilitation program to ensure safe treatment while eliciting the highest outcome that is of practical value to the patient.: Yes Daily medical management of patient stability for increased activity during participation in an intensive rehabilitation regime.: Yes Daily analysis of laboratory values and/or radiology reports with any subsequent need for medication adjustment of medical intervention for : Other;Pulmonary problems;Mood/behavior problems  Elease Hashimoto 06/12/2015, 8:53 AM                 Elease Hashimoto, LCSW Social Worker Signed  Patient Care Conference 05/14/2015  3:45 PM    Expand All Collapse All   Inpatient RehabilitationTeam Conference and Plan of Care Update Date: 05/14/2015   Time: 2:00 PM     Patient Name: Juan Cameron       Medical Record Number: [id number]  Date of Birth: 08/07/1975 Sex: Male         Room/Bed: 2P53I/1W43X-54 Payor Info:  Payor: MEDICARE / Plan: MEDICARE PART A AND B / Product Type: *No Product type* /    Admitting Diagnosis: Debility   Admit Date/Time:  05/06/2015  5:22 PM Admission Comments: No  comment available   Primary Diagnosis:  Debility Principal Problem: Debility    Patient Active Problem List     Diagnosis  Date Noted   .  Sepsis ([place])     .  Cough     .  Right knee pain     .  Cognitive deficits     .  Tachycardia     .  Debility  05/06/2015   .  Encephalopathy  05/06/2015   .  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  Ventilator dependence ([place])  04/08/2015   .  Pressure ulcer  04/05/2015   .  Acute respiratory failure ([place])     .  Fever  03/21/2015   .  Acute renal insufficiency  03/21/2015   .  Obesity  03/21/2015   .  ARDS (adult respiratory distress syndrome) ([place])     .  HCAP (healthcare-associated pneumonia)  03/11/2015   .  Leukocytosis  03/11/2015   .  Acute hyperglycemia  03/11/2015   .  Left ventricular diastolic dysfunction, NYHA class 2  03/11/2015   .  Thrombocytopenia ([place])  03/11/2015   .  Acute lung injury  03/10/2015   .  Acute respiratory failure with hypoxia ([place])  02/28/2015   .  CAP (community acquired pneumonia)  02/28/2015   .  Hypokalemia  02/28/2015   .  Autism  02/28/2015     Expected Discharge Date: Expected Discharge Date: 05/21/15  Team Members Present: Physician leading conference: Dr. Delice Lesch Social Worker Present: Ovidio Kin, LCSW Nurse Present: Dorien Chihuahua, RN PT Present: Other (comment) Lavone Nian & Rory Percy) OT Present: Willeen Cass, OT SLP Present: Windell Moulding, SLP PPS Coordinator present : Daiva Nakayama, RN, CRRN        Current Status/Progress  Goal  Weekly Team Focus   Medical     Debilitation secondary to respiratory failure/ARDS/multilobar pneumonia with ?PNA  Improve cough, leukocytosis, endurance, mobility   see above   Bowel/Bladder     continent of bowel and bladder/ able to use urinal indep but spills at times/LBM 05/12/15  continent of bowel and bladder  cont. of monitor q shift   Swallow/Nutrition/ Hydration               ADL's     Supervision for BADL and transfers    Supervision for BADL, transfers, and dynamic standing balance   Improved activity tolerance, transfers, sit<>stand, dynamic standing balance, BUE therex   Mobility     supervision for bed mobility & transfers, steady A with RW for gait for maximumof 175 ft but averages 40 ft with max encouragement, stairs with Min A & B rails  supervision for standing balance, Mod I bed mobility, supervision transfers & ambulation with LRAD, 3 stairs without rails & supervision  endurance training, gait & stair training, balance training    Communication               Safety/Cognition/ Behavioral Observations    no unsafe behavior/calls for assistance   min assist  continue to monitor q shift   Pain     no complaints of pain  pain less than or equal to 2  continue to monitor q shift   Skin     no skin breakdown  this admission/rash to face-hydrocodone cream to area  min assistance  continue to monitor q shift      *See Care Plan and progress notes for long and short-term goals.    Barriers to Discharge:  Autism, MR, tachy, leukocytosis     Possible Resolutions to Barriers:   Follow labs, improve conditioning and HR, Pulm consult      Discharge Planning/Teaching Needs:   Home with sister she is trying to figure out care for pt until he can return to adult day care. Wants to keep home until fully recovered        Team Discussion:    Goals supervision level-medical issues-pneumonia called in pulmonary and will have swallow eval. Currently on 3 l of O2 to sustain 88-90%. BP issues also. Very deconditioned. Sister wants to be kept updated regarding his medical issues since she is his guardian. Had improved in his activity tolerance before this issue   Revisions to Treatment Plan:    Medical issues    Continued Need for Acute Rehabilitation Level of Care: The patient requires daily medical management by a physician with specialized training in physical medicine and rehabilitation for the following  conditions: Daily direction of a multidisciplinary physical rehabilitation program to ensure safe treatment while eliciting the highest outcome that is of practical value to the patient.: Yes Daily medical management of patient stability for increased activity during participation in an intensive rehabilitation regime.: Yes Daily analysis of laboratory values and/or radiology reports with any subsequent need for medication adjustment of medical intervention for : Other;Pulmonary problems;Mood/behavior problems  Elease Hashimoto 05/14/2015, 3:45 PM                  Patient ID: Cherre Huger, male   DOB: 02/17/1975, 40 y.o.   MRN: [id number]

## 2015-06-21 ENCOUNTER — Ambulatory Visit (HOSPITAL_COMMUNITY): Payer: Self-pay | Admitting: Physical Therapy

## 2015-06-21 LAB — GLUCOSE, CAPILLARY: Glucose-Capillary: 116 mg/dL — ABNORMAL HIGH (ref 65–99)

## 2015-06-21 NOTE — Progress Notes (Signed)
[place] PHYSICAL MEDICINE & REHABILITATION     PROGRESS NOTE  Subjective/Complaints:  No new issues  ROS: Appears to denies CP, SOB, nausea, vomiting, diarrhea, however limited communication.  Objective: Vital Signs: Blood pressure 139/89, pulse 110, temperature 98.5 F (36.9 C), temperature source Oral, resp. rate 20, weight 88.134 kg (194 lb 4.8 oz), SpO2 94 %. No results found.  Recent Labs  06/19/15 0510 06/20/15 0529  WBC 13.4* 13.9*  HGB 8.9* 8.9*  HCT 28.2* 28.1*  PLT 225 225   No results for input(s): NA, K, CL, GLUCOSE, BUN, CREATININE, CALCIUM in the last 72 hours.  Invalid input(s): CO CBG (last 3)  No results for input(s): GLUCAP in the last 72 hours.  Wt Readings from Last 3 Encounters:  06/21/15 88.134 kg (194 lb 4.8 oz)  06/10/15 84.1 kg (185 lb 6.5 oz)  05/16/15 91.536 kg (201 lb 12.8 oz)    Physical Exam:  BP 139/89 mmHg  Pulse 110  Temp(Src) 98.5 F (36.9 C) (Oral)  Resp 20  Wt 88.134 kg (194 lb 4.8 oz)  SpO2 94% Gen: NAD. Well-developed, well-nourished HENT: trach site healed. Normocephalic.  Eyes: Conjunctivae and EOM are normal.  Cardiovascular: Normal rate and regular rhythm  Respiratory: Effort normal. No respiratory distress GI: Soft. Bowel sounds are normal. He exhibits no distension Neurological. Patient was alert. Followed simple commands occasionally. Moving all 4 extremities without difficulty, likely 4/5 b/l UE, and 4/5 b/l LE (?effort).   Skin. Warm and dry. Psych: pt very flat  Assessment/Plan: 1. Functional deficits secondary to respiratory failure/MRSA pneumonia which require 3+ hours per day of interdisciplinary therapy in a comprehensive inpatient rehab setting. Physiatrist is providing close team supervision and 24 hour management of active medical problems listed below. Physiatrist and rehab team continue to assess barriers to discharge/monitor patient progress toward functional and medical  goals.  Function:  Bathing Bathing position   Position: Wheelchair/chair at sink  Bathing parts Body parts bathed by patient: Right arm, Left arm, Abdomen, Chest, Front perineal area, Right upper leg, Left upper leg, Right lower leg, Left lower leg, Buttocks Body parts bathed by helper: Back  Bathing assist Assist Level: Supervision or verbal cues   Set up : To obtain items  Upper Body Dressing/Undressing Upper body dressing   What is the patient wearing?: Pull over shirt/dress     Pull over shirt/dress - Perfomed by patient: Thread/unthread right sleeve, Thread/unthread left sleeve, Put head through opening, Pull shirt over trunk          Upper body assist Assist Level: Set up   Set up : To obtain clothing/put away  Lower Body Dressing/Undressing Lower body dressing   What is the patient wearing?: Underwear, Pants, Shoes, [employer] - Performed by patient: Thread/unthread right underwear leg, Thread/unthread left underwear leg, Pull underwear up/down   Pants- Performed by patient: Thread/unthread right pants leg, Thread/unthread left pants leg, Pull pants up/down, Fasten/unfasten pants   Non-skid slipper socks- Performed by patient: Don/doff left sock, Don/doff right sock       Shoes - Performed by patient: Don/doff right shoe, Don/doff left shoe Shoes - Performed by helper: Fasten right, Fasten left       TED Hose - Performed by helper: Don/doff right TED hose, Don/doff left TED hose  Lower body assist Assist for lower body dressing: Set up, Supervision or verbal cues   Set up : To obtain clothing/put away  Toileting Toileting Toileting activity did not occur: N/A Toileting  steps completed by patient: Performs perineal hygiene, Adjust clothing after toileting, Adjust clothing prior to toileting Toileting steps completed by helper: Adjust clothing after toileting Toileting Assistive Devices: Grab bar or rail  Toileting assist Assist level: Supervision or  verbal cues   Transfers Chair/bed transfer Chair/bed transfer activity did not occur: N/A Chair/bed transfer method: Ambulatory Chair/bed transfer assist level: Supervision or verbal cues Chair/bed transfer assistive device: Patent attorneyWalker     Locomotion Ambulation     Max distance: 175 Assist level: Supervision or verbal cues   Wheelchair   Type: Manual Max wheelchair distance: 3575ft Assist Level: Supervision or verbal cues  Cognition Comprehension Comprehension assist level: Understands basic 75 - 89% of the time/ requires cueing 10 - 24% of the time  Expression Expression assist level: Expresses basic 50 - 74% of the time/requires cueing 25 - 49% of the time. Needs to repeat parts of sentences.  Social Interaction Social Interaction assist level: Interacts appropriately 75 - 89% of the time - Needs redirection for appropriate language or to initiate interaction.  Problem Solving Problem solving assist level: Solves basic 50 - 74% of the time/requires cueing 25 - 49% of the time  Memory Memory assist level: Recognizes or recalls 50 - 74% of the time/requires cueing 25 - 49% of the time    Medical Problem List and Plan: 1. Debilitation secondary to respiratory failure/MRSA pneumonia.   Contact precautions. All antibiotic therapy is completed.   -pt is medically stable for discharge  Taper of prednisone over 2 weeks, began on 6/1 2. DVT Prophylaxis/Anticoagulation: Subcutaneous heparin. Monitor for any bleeding episodes 3. Pain Management: Tylenol as needed 4. Mood/severe autism: Klonopin 0.5 mg daily at bedtime, Zyprexa 5 mg twice a day. Patient appears to be at his baseline per family 5. Neuropsych: This patient is not capable of making decisions on his own behalf. 6. Skin/Wound Care: Routine skin checks 7. Fluids/Electrolytes/Nutrition: Routine I&O's 8. Dysphagia. Advanced to regular diet on 5/31.   Monitor for any signs of aspiration 9. Acute on chronic anemia. Continue iron  supplement.   Hgb has been holding 8.9  Hemoccult stools remain pending---will request again today 10. History of sickle cell trait. 11. Leukocytosis: Afebrile  - secondary to steroids  WBC's down to 13.9----do not need daily wbc's---recheck monday  CXR on 6/2 reviewed, likely atelectasis  ucx on 6/5 without sig growth  LOS (Days) 11 A FACE TO FACE EVALUATION WAS PERFORMED  Juan Cameron 06/21/2015 9:58 AM

## 2015-06-21 NOTE — Progress Notes (Signed)
Physical Therapy Session Note  Patient Details  Name: Juan Cameron MRN: [id number] Date of Birth: 1975-07-18  Today's Date: 06/21/2015 PT Individual Time: 1030-1102 PT Individual Time Calculation (min): 32 min   Short Term Goals: Week 1:  PT Short Term Goal 1 (Week 1): Pt will negotiate 4 steps with 1 rail with steady A. PT Short Term Goal 1 - Progress (Week 1): Progressing toward goal PT Short Term Goal 2 (Week 1): Pt will ambulate 50 ft with LRAD & Min A.  PT Short Term Goal 2 - Progress (Week 1): Met PT Short Term Goal 3 (Week 1): Pt will tolerate 15 consecutive minutes of therapy while maintaining >90% SpO2. PT Short Term Goal 3 - Progress (Week 1): Progressing toward goal  Skilled Therapeutic Interventions/Progress Updates:    Patient received sitting in [facility] with sister present and agreeable to PT PT instructed patient in car transfer without AD with supervision A. Patient completed car transfer second time with sister for supervision A. Instruction provided for proper positioning for increased support and to cue patient to increase use of UE to prevent accelerated descent into car.   PT also instructed patient in stair training with RW and min A from PT x 2 on 4 steps . One bout with PT to improve stability of RW and one bout with patient's sister to provide assist. PT educated in proper sequencing and technique as well as to increased stability of RW.    Patient returned to room sitting in [facility] and left with sister in room and call bell within reach.   Therapy Documentation Precautions:  Precautions Precautions: Fall Precaution Comments: 3L/min supplemental oxygen via nasal cannula Restrictions Weight Bearing Restrictions: No General:   Vital Signs: Therapy Vitals Temp: 98.5 F (36.9 C) Temp Source: Oral Pulse Rate: (!) 120 Resp: 20 BP: 122/77 mmHg Patient Position (if appropriate): Lying     See Function Navigator for Current Functional Status.   Therapy/Group:  Individual Therapy  [name] 06/21/2015, 4:19 PM

## 2015-06-22 ENCOUNTER — Inpatient Hospital Stay (HOSPITAL_COMMUNITY): Payer: Medicare Other | Admitting: Physical Therapy

## 2015-06-22 MED ORDER — SORBITOL 70 % SOLN: 60.0000 mL | Status: DC

## 2015-06-22 MED ORDER — SENNOSIDES-DOCUSATE SODIUM 8.6-50 MG PO TABS
2.0000 | ORAL_TABLET | Freq: Every day | ORAL | Status: DC
  Administered 2015-06-22 – 2015-06-23 (×2): 2 via ORAL
  Filled 2015-06-22 (×2): qty 2

## 2015-06-22 NOTE — Progress Notes (Addendum)
[place] PHYSICAL MEDICINE & REHABILITATION     PROGRESS NOTE  Subjective/Complaints:  No new issues  ROS: Appears to denies CP, SOB, nausea, vomiting, diarrhea, however limited communication.  Objective: Vital Signs: Blood pressure 113/71, pulse 105, temperature 98.8 F (37.1 C), temperature source Oral, resp. rate 20, weight 88.996 kg (196 lb 3.2 oz), SpO2 97 %. No results found.  Recent Labs  06/20/15 0529  WBC 13.9*  HGB 8.9*  HCT 28.1*  PLT 225   No results for input(s): NA, K, CL, GLUCOSE, BUN, CREATININE, CALCIUM in the last 72 hours.  Invalid input(s): CO CBG (last 3)   Recent Labs  06/21/15 2044  GLUCAP 116*    Wt Readings from Last 3 Encounters:  06/22/15 88.996 kg (196 lb 3.2 oz)  06/10/15 84.1 kg (185 lb 6.5 oz)  05/16/15 91.536 kg (201 lb 12.8 oz)    Physical Exam:  BP 113/71 mmHg  Pulse 105  Temp(Src) 98.8 F (37.1 C) (Oral)  Resp 20  Wt 88.996 kg (196 lb 3.2 oz)  SpO2 97% Gen: NAD. Well-developed, well-nourished HENT: trach site healed. Normocephalic.  Eyes: Conjunctivae and EOM are normal.  Cardiovascular: Normal rate and regular rhythm  Respiratory: Effort normal. No respiratory distress GI: Soft. Bowel sounds are normal. He exhibits no distension Neurological. Patient was alert. Followed simple commands occasionally. Moving all 4 extremities without difficulty, likely 4/5 b/l UE, and 4/5 b/l LE (?effort).   Skin. Warm and dry. Psych: pt very flat  Assessment/Plan: 1. Functional deficits secondary to respiratory failure/MRSA pneumonia which require 3+ hours per day of interdisciplinary therapy in a comprehensive inpatient rehab setting. Physiatrist is providing close team supervision and 24 hour management of active medical problems listed below. Physiatrist and rehab team continue to assess barriers to discharge/monitor patient progress toward functional and medical goals.  Function:  Bathing Bathing position   Position:  Wheelchair/chair at sink  Bathing parts Body parts bathed by patient: Right arm, Left arm, Abdomen, Chest, Front perineal area, Right upper leg, Left upper leg, Right lower leg, Left lower leg, Buttocks Body parts bathed by helper: Back  Bathing assist Assist Level: Supervision or verbal cues   Set up : To obtain items  Upper Body Dressing/Undressing Upper body dressing   What is the patient wearing?: Pull over shirt/dress     Pull over shirt/dress - Perfomed by patient: Thread/unthread right sleeve, Thread/unthread left sleeve, Put head through opening, Pull shirt over trunk          Upper body assist Assist Level: Set up   Set up : To obtain clothing/put away  Lower Body Dressing/Undressing Lower body dressing   What is the patient wearing?: Underwear, Pants, Shoes, [employer] - Performed by patient: Thread/unthread right underwear leg, Thread/unthread left underwear leg, Pull underwear up/down   Pants- Performed by patient: Thread/unthread right pants leg, Thread/unthread left pants leg, Pull pants up/down, Fasten/unfasten pants   Non-skid slipper socks- Performed by patient: Don/doff left sock, Don/doff right sock       Shoes - Performed by patient: Don/doff right shoe, Don/doff left shoe Shoes - Performed by helper: Fasten right, Fasten left       TED Hose - Performed by helper: Don/doff right TED hose, Don/doff left TED hose  Lower body assist Assist for lower body dressing: Set up, Supervision or verbal cues   Set up : To obtain clothing/put away  Toileting Toileting Toileting activity did not occur: N/A Toileting steps completed by patient: Adjust  clothing prior to toileting, Performs perineal hygiene, Adjust clothing after toileting Toileting steps completed by helper: Adjust clothing prior to toileting Toileting Assistive Devices: Grab bar or rail  Toileting assist Assist level: Supervision or verbal cues   Transfers Chair/bed transfer Chair/bed  transfer activity did not occur: N/A Chair/bed transfer method: Ambulatory Chair/bed transfer assist level: Supervision or verbal cues Chair/bed transfer assistive device: Patent attorneyWalker     Locomotion Ambulation     Max distance: 175 Assist level: Supervision or verbal cues   Wheelchair   Type: Manual Max wheelchair distance: 1675ft Assist Level: Supervision or verbal cues  Cognition Comprehension Comprehension assist level: Understands basic 75 - 89% of the time/ requires cueing 10 - 24% of the time  Expression Expression assist level: Expresses basic 50 - 74% of the time/requires cueing 25 - 49% of the time. Needs to repeat parts of sentences.  Social Interaction Social Interaction assist level: Interacts appropriately 75 - 89% of the time - Needs redirection for appropriate language or to initiate interaction.  Problem Solving Problem solving assist level: Solves basic 50 - 74% of the time/requires cueing 25 - 49% of the time  Memory Memory assist level: Recognizes or recalls 50 - 74% of the time/requires cueing 25 - 49% of the time    Medical Problem List and Plan: 1. Debilitation secondary to respiratory failure/MRSA pneumonia.   Contact precautions. All antibiotic therapy is completed.   -pt is medically stable for discharge  Taper of prednisone over 2 weeks, began on 6/1 2. DVT Prophylaxis/Anticoagulation: Subcutaneous heparin. Monitor for any bleeding episodes 3. Pain Management: Tylenol as needed 4. Mood/severe autism: Klonopin 0.5 mg daily at bedtime, Zyprexa 5 mg twice a day. Patient appears to be at his baseline per family 5. Neuropsych: This patient is not capable of making decisions on his own behalf. 6. Skin/Wound Care: Routine skin checks 7. Fluids/Electrolytes/Nutrition: Routine I&O's 8. Dysphagia. Advanced to regular diet on 5/31.   Monitor for any signs of aspiration 9. Acute on chronic anemia. Continue iron supplement.   Hgb has been holding 8.9  Hemoccult stools  not done. Have requested that a sample be collected. (no bm since 6/9) 10. History of sickle cell trait. 11. Leukocytosis: remains Afebrile  - secondary to steroids  WBC's down to 13.9----do not need daily wbc's---recheck monday  CXR on 6/2 reviewed, likely atelectasis  ucx on 6/5 without sig growth 12. Constipation: laxative assistance--RN reports there was large BM yesterday though none was recorded  LOS (Days) 12 A FACE TO FACE EVALUATION WAS PERFORMED  Juan Cameron 06/22/2015 9:30 AM

## 2015-06-22 NOTE — Progress Notes (Signed)
Physical Therapy Session Note  Patient Details  Name: Juan RipperRobert Brillhart MRN: [id number] Date of Birth: [birth year]  Today's Date: 06/22/2015 PT Individual Time: 1050-1115 PT Individual Time Calculation (min): 25 min   Short Term Goals: Week 2:  PT Short Term Goal 1 (Week 2): STG = LTG due to anticipated d/c date.  Skilled Therapeutic Interventions/Progress Updates:    Pt received in bed & agreeable to PT, noting a stomach ache but did not rate pain. Pt reported need to use restroom & wished to use urinal. Encouraged pt to ambulate to bathroom; gait training x 10 ft with RW & supervision A with PT managing portable oxygen tank. Pt able to complete toileting & hand hygiene while standing without BUE support & supervision A, (+) void. Gait training x 120 ft room>gym with RW & supervision A; stair negotiation completed with RW with minimal cuing from PT for placement of walker. Pt able to negotiate 4 steps with RW & Min A, PT managing oxygen tank. After task HR = 130 bpm, SpO2 = 93% on 3L/min. After rest break gait training x 120 ft gym>room completed. At end of session pt left in bed with alarm set & all needs within reach; HR = 123 bpm, SpO2 = 100% on 3L/min.  Therapy Documentation Precautions:  Precautions Precautions: Fall Precaution Comments: 3L/min supplemental oxygen via nasal cannula Restrictions Weight Bearing Restrictions: No   See Function Navigator for Current Functional Status.   Therapy/Group: Individual Therapy  Sandi MariscalVictoria M Marte Celani 06/22/2015, 10:53 AM

## 2015-06-23 ENCOUNTER — Inpatient Hospital Stay (HOSPITAL_COMMUNITY): Payer: Medicare Other | Admitting: Occupational Therapy

## 2015-06-23 ENCOUNTER — Inpatient Hospital Stay (HOSPITAL_COMMUNITY): Payer: Medicare Other | Admitting: Speech Pathology

## 2015-06-23 ENCOUNTER — Inpatient Hospital Stay (HOSPITAL_COMMUNITY): Payer: Medicare Other | Admitting: Physical Therapy

## 2015-06-23 LAB — CBC
HCT: 30.4 % — ABNORMAL LOW (ref 39.0–52.0)
HEMOGLOBIN: 9.5 g/dL — AB (ref 13.0–17.0)
MCH: 26.3 pg (ref 26.0–34.0)
MCHC: 31.3 g/dL (ref 30.0–36.0)
MCV: 84.2 fL (ref 78.0–100.0)
Platelets: 274 10*3/uL (ref 150–400)
RBC: 3.61 MIL/uL — AB (ref 4.22–5.81)
RDW: 17 % — ABNORMAL HIGH (ref 11.5–15.5)
WBC: 12.7 10*3/uL — AB (ref 4.0–10.5)

## 2015-06-23 MED ORDER — PREDNISONE 5 MG PO TABS
5.0000 mg | ORAL_TABLET | Freq: Every day | ORAL | Status: DC
  Administered 2015-06-24: 5 mg via ORAL
  Filled 2015-06-23: qty 1

## 2015-06-23 NOTE — Progress Notes (Signed)
Speech Language Pathology Daily Session Note  Patient Details  Name: Juan Cameron MRN: [id number] Date of Birth: 03-25-1975  Today's Date: 06/23/2015 SLP Individual Time: [id number] SLP Individual Time Calculation (min): 25 min  Short Term Goals: Week 2: SLP Short Term Goal 1 (Week 2): Pt will utilize external aids to recall and utilize vocal hygiene procedures with Min verbal cues.  Skilled Therapeutic Interventions:  Pt was seen for skilled ST targeting communication goals.  Therapist reviewed and reinforced vocal hygiene practices given that pt did not recall any recommendations from previous therapy sessions.  With errorless learning technique of spaced retrieval technique pt was able to recall 1 vocal hygiene technique with min assist question cues following a ~5 minute delay.  Pt continues to present with hoarse vocal quality and pain during sustained phonation exercises.  Pt was left sitting at edge of bed with a visitor present.  Continue per current plan of care.    Function:  Eating Eating              Cognition Comprehension Comprehension assist level: Understands basic 75 - 89% of the time/ requires cueing 10 - 24% of the time  Expression   Expression assist level: Expresses basic 75 - 89% of the time/requires cueing 10 - 24% of the time. Needs helper to occlude trach/needs to repeat words.  Social Interaction Social Interaction assist level: Interacts appropriately 75 - 89% of the time - Needs redirection for appropriate language or to initiate interaction.  Problem Solving Problem solving assist level: Solves basic 50 - 74% of the time/requires cueing 25 - 49% of the time  Memory Memory assist level: Recognizes or recalls 50 - 74% of the time/requires cueing 25 - 49% of the time    Pain Pain Assessment Pain Assessment: No/denies pain  Therapy/Group: Individual Therapy  Kariyah Baugh, Melanee SpryNicole L 06/23/2015, 4:18 PM

## 2015-06-23 NOTE — Discharge Summary (Signed)
NAMEMICKELL, BIRDWELL NO.:  [id number]  MEDICAL RECORD NO.:  [id number]  LOCATION:  [id number]                        FACILITY:  [facility]  PHYSICIAN:  Maryla Morrow, MD        DATE OF BIRTH:  1975/04/03  DATE OF ADMISSION:  06/10/2015 DATE OF DISCHARGE:  06/24/2015                              DISCHARGE SUMMARY   DISCHARGE DIAGNOSES: 1. Debilitation secondary to chronic respiratory failure. 2. Subcutaneous heparin for DVT prophylaxis. 3. Severe autism. 4. Dysphagia, resolved. 5. Acute on chronic anemia. 6. History of sickle cell trait. 7. Leukocytosis. 8. Constipation, resolved.  HISTORY OF PRESENT ILLNESS:  This is a 40 year old right-handed African American male with significant history of autism with element of attention deficit hyperactivity disorder, who lives with his sister and attends adult day care.  The patient originally admitted to [facility] for multilobar pneumonia discharged to home March 10, 2015, still needing 4 L of oxygen.  He was admitted back to the hospital on April 07, 2015, with respiratory failure, ARDS.  He did require intubation.  Hospital course complicated by acute kidney injury with dialysis initiated.  He did receive a short time of dialysis. Creatinine rebounded nicely.  Required tracheostomy on March 20, 2015, was transferred to [facility] for ongoing care. Ultimately, decannulated on April 25, 2015, diet slowly advanced.  Bouts of tachycardia, atrial fibrillation, converted back to normal sinus rhythm maintained on Cardizem.  Vancomycin-resistant enterococci urinary tract infection treated, remained on contact precautions.  Generalized weakness, therapies initiated.  Admitted for comprehensive rehab program on May 06, 2015, ambulating 10 feet without assistance, needing some encouragement to participate.  On May 16, 2015, periodic bouts of hypoxia into the low 70s-80s, Pulmonary service followup.  Chest  x-ray, persistent mild interstitial prominence, suspicious for mild interstitial edema with bilateral basilar atelectasis versus infiltrate. Placed on antibiotic therapy.  On the morning of May 16, 2015, in need of non-rebreather mask for decreasing saturations, he was discharged to acute care for ongoing evaluation and monitoring.  Necessitated need for ventilatory support for a short time, responded well to intravenous Solu- Medrol.  The patient's acute respiratory failure, felt to be due to MRSA pneumonia, pulmonary edema, maintained on contact precautions. Subcutaneous heparin for DVT prophylaxis.  Psychiatry followup Jun 05, 2015, for patient's autism, depression, initially placed on Seroquel and changed to Zyprexa.  Therapy is ongoing.  The patient was readmitted for a comprehensive rehab program.  PAST MEDICAL HISTORY:  See discharge diagnoses.  SOCIAL HISTORY:  Lives with relatives, sisters, reported to be independent prior to admission, attending adult day care.  Functional status upon admission to rehab services was minimal assist, stand pivot transfers, minimal assist ambulate 20 feet 2 person handheld assistance. Mod to max assist activities of daily living with encouragement to participate.  PHYSICAL EXAMINATION:  VITAL SIGNS:  Blood pressure 144/86, pulse 103, temperature 98, respirations 17. GENERAL:  This was an alert male, in no acute distress.  Sitting at edge of bed.  Trach site well healed. LUNGS:  Clear to auscultation without wheeze. CARDIAC:  Regular rate and rhythm.  No murmur. ABDOMEN:  Soft, nontender.  Good bowel sounds.  Strength grossly graded 4/5.  REHABILITATION HOSPITAL COURSE:  Patient was admitted to inpatient rehab services with therapies initiated on a 3-hour daily basis, consisting of physical therapy, occupational therapy, speech therapy, and rehabilitation nursing.  The following issues were addressed during the patient's rehabilitation stay.   Pertaining to the Mr. Convery's debilitation secondary to respiratory failure, MRSA pneumonia, he completed antibiotic therapy, close monitoring while prednisone tapered. Oxygen therapy as directed, oxygen saturations remained stable. Subcutaneous heparin for DVT prophylaxis.  Severe autism currently maintained on Klonopin as well as Zyprexa.  The patient appeared to be at baseline per family.  Acute on chronic anemia.  Hemoglobin 8.9, maintained on iron supplement.  Diet was advanced to regular, tolerating well.  Mild leukocytosis felt to be induced by steroids.  Chest x-ray unremarkable.  Urine culture without significant growth.  Bouts of constipation resolved with laxative assistance.  The patient received weekly collaborative interdisciplinary team conferences to discuss estimated length of stay, family teaching, any barriers to discharge. Again needing encouragement at times to participate.  Ambulating 10 feet rolling walker, supervision.  Managing portable oxygen tank.  Able to complete toileting and hand hygiene while standing without bilateral upper extremity support.  He could increase his ambulation up to 120 feet with rolling walker, supervision, stair negotiation completed with minimal cuing.  Close monitoring of again of oxygen saturations.  He could gather his belongings for activities of daily living and homemaking.  Overall, strength and endurance greatly improved.  Family encouraged of progress.  Plan discharge to home.  DISCHARGE MEDICATIONS: 1. Klonopin 0.5 mg p.o. at bedtime. 2. Ferrous sulfate 325 mg p.o. b.i.d. 3. Zyprexa 5 mg p.o. b.i.d. 4. Protonix 40 mg p.o. daily. 5. Prednisone  mg daily 3 days then 2.5 mg daily 3 days taper as directed. 6. Vitamin C 250 mg p.o. b.i.d.  DIET:  Regular.  The patient would follow up with Dr. Allena KatzPatel as needed. [facility] in 2 weeks.  Dr. Levy Pupaobert Byrum, Pulmonary Services call for  appointment.  Oxygen therapy as directed.     Mariam Dollaraniel Angiulli, P.A.   ______________________________ Maryla MorrowAnkit Ermalee Mealy, MD    DA/MEDQ  D:  06/23/2015  T:  06/23/2015  Job:  [id number]  cc:   Maryla MorrowAnkit Daphanie Oquendo, MD Alehegn Asres Leslye Peerobert S. Byrum, MD

## 2015-06-23 NOTE — Progress Notes (Signed)
Occupational Therapy Session Note  Patient Details  Name: Juan Cameron MRN: [id number] Date of Birth: [birth year]  Today's Date: 06/23/2015 OT Individual Time: 1300-1400 OT Individual Time Calculation (min): 60 min    Short Term Goals: Week 1:  OT Short Term Goal 1 (Week 1): Pt will engage in 10 minutes of functional tasks with 2 or less rest breaks. OT Short Term Goal 1 - Progress (Week 1): Met OT Short Term Goal 2 (Week 1): Pt will perform LB dressing with min A in order to increase I in self care. OT Short Term Goal 2 - Progress (Week 1): Met OT Short Term Goal 3 (Week 1): Pt willl perform clothing managemenmt with contact guard for balance after toileting in order to increase balance with functional tasks. OT Short Term Goal 3 - Progress (Week 1): Met Week 2:  OT Short Term Goal 1 (Week 2): STGs=LTGs secondary to ELOS      Skilled Therapeutic Interventions/Progress Updates:    Pt seen this session for ADL retraining to address activity tolerance and balance. Pt agreeable to a shower to prepare for his discharge home today. Pt demonstrated good initiation, activity tolerance (O2 95% after shower), and standing balance during ambulation and LB dressing. Pt also toileted and able to have a BM.  As a reward, pt given his favorite chocolate ice cream. Pt then asked to complete oral care. Pt wanted to have his supplies handed to him instead of walking to the sink.  With much encouragement, pt walked to sink and stood for 5 min to complete grooming. O2 sats 100% with HR 117.  Pt ambulated back to bed. Reviewed pt's progress with him and that he met his goals. Pt stated "I am strong now". Pt in room with all needs met.  Therapy Documentation Precautions:  Precautions Precautions: Fall Precaution Comments: 3L/min supplemental oxygen via nasal cannula Restrictions Weight Bearing Restrictions: No Oxygen Therapy O2 Device: Not Delivered Pain: Pain Assessment Pain Assessment: No/denies  pain ADL:   see Function Navigator for Current Functional Status.   Therapy/Group: Individual Therapy  SAGUIER,JULIA 06/23/2015, 1:14 PM

## 2015-06-23 NOTE — Progress Notes (Signed)
Social Work Patient ID: Alan Ripperobert Dowlen, male   DOB: October 10, 1975, 40 y.o.   MRN: [id number] Ordered equipment along with [place] O2 sister aware [facility] to call her to set up delivering the O2 concentrator to home. [facility] and [facility] is on the [facility] waiting list. Sister was here Sat and went through therapies with pt. Pt excited about going home tomorrow.

## 2015-06-23 NOTE — Progress Notes (Signed)
Physical Therapy Discharge Summary  Patient Details  Name: Juan Cameron MRN: [id number] Date of Birth: 05-Aug-1975  Today's Date: 06/23/2015 PT Individual Time: 1002-1100 PT Individual Time Calculation (min): 58 min    Patient has met 6 of 9 long term goals due to improved activity tolerance, improved balance, improved postural control, increased strength, decreased pain and improved awareness.  Patient to discharge at an ambulatory level Supervision.   Patient's care partner is independent to provide the necessary cognitive assistance at discharge.  Reasons goals not met: Pt limited by cognitive deficits that negatively impact his motivation throughout therapy treatment & decrease his safety awareness.  Recommendation:  Patient will benefit from ongoing skilled PT services in home health setting to continue to advance safe functional mobility, address ongoing impairments in decreased balance, decreased overall strength, reduced endurance, to progress towards ambulation with more LRAD, increase safety awareness, and minimize fall risk.  Recommend that pt utilize RW for household ambulation & w/c for community ambulation.   Equipment: wheelchair, supplemental oxygen  Reasons for discharge: treatment goals met  Patient/family agrees with progress made and goals achieved: Yes  Skilled PT Treatment: Pt received in w/c denying c/o pain & agreeable to PT treatment. Manual testing completed, please see below for details. Pt is currently at a supervision level of mobility with use of RW & assistance for managing oxygen tubing. Pt requires supervision & cuing for safety with all tasks including car transfers, sit<>stand transfers, stand pivot transfers, and ambulation with RW. Pt requires min A for stair negotiation with RW & no rails, as pt requires assistance to stabilize RW & for cuing for sequencing. Pt has reached maximum level of function while in this setting as pt is limited by baseline  cognitive deficits that impair his safety awareness & has negatively impacted his motivation to participate while in CIR. This therapist recommends that pt d/c home with 24 hr supervision, RW for household mobility, w/c for community mobility & supplemental oxygen. At end of d/c session pt left sitting in w/c with QRB in place & all needs within reach.  Pulse oximetry on room air is 88-94% at rest. After ambulating 15 ft with RW on room air pt's SpO2 = 84%; after rest break on 3L/min supplemental oxygen via nasal cannula, SpO2 increased to 95%.  PT Discharge Precautions/Restrictions Restrictions Weight Bearing Restrictions: No  Pain Pain Assessment Pain Assessment: No/denies pain  Vision/Perception  Pt denies wearing glasses & reports no changes in vision from baseline.  Cognition Overall Cognitive Status:  (pt with baseline cognitive deficits 2/2 autism) Arousal/Alertness: Awake/alert Orientation Level: Oriented to person;Oriented to place (oriented to city & "hospital") Attention: Selective Awareness: Impaired Problem Solving: Impaired Safety/Judgment: Impaired  Sensation Sensation Light Touch: Appears Intact (BLE; but difficult to accurately assess 2/2 cognitive deficits) Proprioception: Appears Intact (BLE)  Motor  Motor Motor:  (improved strength overall)   Mobility Bed Mobility Bed Mobility: Rolling Left;Supine to Sit;Sit to Supine Rolling Left: 6: Modified independent (Device/Increase time) Supine to Sit: 6: Modified independent (Device/Increase time) Sit to Supine: 6: Modified independent (Device/Increase time) Transfers Transfers: Yes Sit to Stand: 5: Supervision Sit to Stand Details: Verbal cues for technique;Verbal cues for sequencing Stand to Sit: 5: Supervision Stand Pivot Transfers: 5: Supervision (without AD)   Locomotion  Ambulation Ambulation: Yes Ambulation/Gait Assistance: 5: Supervision Ambulation Distance (Feet): 200 Feet Assistive device:  Rolling walker Ambulation/Gait Assistance Details: Verbal cues for technique Gait Gait: Yes Gait Pattern: Decreased step length - left;Decreased step length -  right (decreased gait speed) Stairs / Additional Locomotion Stairs: Yes Stairs Assistance: 4: Min assist (for RW stabilization) Stairs Assistance Details: Verbal cues for technique;Verbal cues for sequencing Stair Management Technique: No rails;With walker Number of Stairs: 4 Height of Stairs: 6 (inches) Ramp: 5: Supervision (with RW)   Balance Balance Balance Assessed: Yes Static Standing Balance Static Standing - Balance Support: Bilateral upper extremity supported (on RW) Static Standing - Level of Assistance: 5: Stand by assistance Dynamic Standing Balance Dynamic Standing - Balance Support: Bilateral upper extremity supported (on RW) Dynamic Standing - Level of Assistance: 5: Stand by assistance Dynamic Standing - Balance Activities:  (functional task)   Extremity Assessment  RLE Assessment RLE Assessment: Within Functional Limits RLE Strength Right Hip Flexion: 4-/5 Right Knee Flexion: 4/5 Right Knee Extension: 4/5 Right Ankle Dorsiflexion: 3+/5 LLE Assessment LLE Assessment: Within Functional Limits LLE Strength Left Hip Flexion: 4-/5 Left Knee Flexion: 4/5 Left Knee Extension: 4/5 Left Ankle Dorsiflexion: 3/5   See Function Navigator for Current Functional Status.  Waunita Schooner 06/23/2015, 10:11 AM

## 2015-06-23 NOTE — Progress Notes (Addendum)
[place] PHYSICAL MEDICINE & REHABILITATION     PROGRESS NOTE  Subjective/Complaints:  Pt laying in bed, unwilling to open his eyes.   ROS: Appears to denies CP, SOB, nausea, vomiting, diarrhea, however limited communication.  Objective: Vital Signs: Blood pressure 130/88, pulse 101, temperature 98.8 F (37.1 C), temperature source Oral, resp. rate 20, weight 89.132 kg (196 lb 8 oz), SpO2 100 %. No results found.  Recent Labs  06/23/15 0635  WBC 12.7*  HGB 9.5*  HCT 30.4*  PLT 274   No results for input(s): NA, K, CL, GLUCOSE, BUN, CREATININE, CALCIUM in the last 72 hours.  Invalid input(s): CO CBG (last 3)   Recent Labs  06/21/15 2044  GLUCAP 116*    Wt Readings from Last 3 Encounters:  06/23/15 89.132 kg (196 lb 8 oz)  06/10/15 84.1 kg (185 lb 6.5 oz)  05/16/15 91.536 kg (201 lb 12.8 oz)    Physical Exam:  BP 130/88 mmHg  Pulse 101  Temp(Src) 98.8 F (37.1 C) (Oral)  Resp 20  Wt 89.132 kg (196 lb 8 oz)  SpO2 100% Gen: NAD. Well-developed, well-nourished HENT: trach site healed. Normocephalic.  Eyes: Conjunctivae and EOM are normal.  Cardiovascular: Normal rate and regular rhythm  Respiratory: Effort normal. No respiratory distress GI: Soft. Bowel sounds are normal. He exhibits no distension Neurological. Patient was alert. Followed simple commands occasionally. Moving all 4 extremities without difficulty, likely 4/5 b/l UE, and 4/5 b/l LE (?effort).   Skin. Warm and dry. Psych: pt very flat. Unengaged  Assessment/Plan: 1. Functional deficits secondary to respiratory failure/MRSA pneumonia which require 3+ hours per day of interdisciplinary therapy in a comprehensive inpatient rehab setting. Physiatrist is providing close team supervision and 24 hour management of active medical problems listed below. Physiatrist and rehab team continue to assess barriers to discharge/monitor patient progress toward functional and medical  goals.  Function:  Bathing Bathing position   Position: Wheelchair/chair at sink  Bathing parts Body parts bathed by patient: Right arm, Left arm, Abdomen, Chest, Front perineal area, Right upper leg, Left upper leg, Right lower leg, Left lower leg, Buttocks Body parts bathed by helper: Back  Bathing assist Assist Level: Supervision or verbal cues   Set up : To obtain items  Upper Body Dressing/Undressing Upper body dressing   What is the patient wearing?: Pull over shirt/dress     Pull over shirt/dress - Perfomed by patient: Thread/unthread right sleeve, Thread/unthread left sleeve, Put head through opening, Pull shirt over trunk          Upper body assist Assist Level: Set up   Set up : To obtain clothing/put away  Lower Body Dressing/Undressing Lower body dressing   What is the patient wearing?: Underwear, Pants, Shoes, [employer] - Performed by patient: Thread/unthread right underwear leg, Thread/unthread left underwear leg, Pull underwear up/down   Pants- Performed by patient: Thread/unthread right pants leg, Thread/unthread left pants leg, Pull pants up/down, Fasten/unfasten pants   Non-skid slipper socks- Performed by patient: Don/doff left sock, Don/doff right sock       Shoes - Performed by patient: Don/doff right shoe, Don/doff left shoe Shoes - Performed by helper: Fasten right, Fasten left       TED Hose - Performed by helper: Don/doff right TED hose, Don/doff left TED hose  Lower body assist Assist for lower body dressing: Set up, Supervision or verbal cues   Set up : To obtain clothing/put away  Toileting Toileting Toileting activity did not  occur: N/A Toileting steps completed by patient: Adjust clothing prior to toileting, Performs perineal hygiene, Adjust clothing after toileting Toileting steps completed by helper: Adjust clothing prior to toileting Toileting Assistive Devices: Grab bar or rail  Toileting assist Assist level: Supervision or  verbal cues   Transfers Chair/bed transfer Chair/bed transfer activity did not occur: N/A Chair/bed transfer method: Ambulatory Chair/bed transfer assist level: Supervision or verbal cues Chair/bed transfer assistive device: Patent attorneyWalker     Locomotion Ambulation     Max distance: 120 ft Assist level: Supervision or verbal cues   Wheelchair   Type: Manual Max wheelchair distance: 1675ft Assist Level: Supervision or verbal cues  Cognition Comprehension Comprehension assist level: Understands basic 75 - 89% of the time/ requires cueing 10 - 24% of the time  Expression Expression assist level: Expresses basic 50 - 74% of the time/requires cueing 25 - 49% of the time. Needs to repeat parts of sentences.  Social Interaction Social Interaction assist level: Interacts appropriately 75 - 89% of the time - Needs redirection for appropriate language or to initiate interaction.  Problem Solving Problem solving assist level: Solves basic 50 - 74% of the time/requires cueing 25 - 49% of the time  Memory Memory assist level: Recognizes or recalls 50 - 74% of the time/requires cueing 25 - 49% of the time    Medical Problem List and Plan: 1. Debilitation secondary to respiratory failure/MRSA pneumonia.   Contact precautions. All antibiotic therapy is completed.   -pt is medically stable for discharge  Taper of prednisone, began on 6/1, decreased again 6/12 2. DVT Prophylaxis/Anticoagulation: Subcutaneous heparin. Monitor for any bleeding episodes 3. Pain Management: Tylenol as needed 4. Mood/severe autism: Klonopin 0.5 mg daily at bedtime, Zyprexa 5 mg twice a day. Patient appears to be at his baseline per family 5. Neuropsych: This patient is not capable of making decisions on his own behalf. 6. Skin/Wound Care: Routine skin checks 7. Fluids/Electrolytes/Nutrition: Routine I&O's 8. Dysphagia. Advanced to regular diet on 5/31.   Monitor for any signs of aspiration 9. Acute on chronic anemia.  Continue iron supplement.   Hgb 9.5 on 6/12  Hemoccult stools not done. Have requested that a sample be collected. (no bm since 6/9) 10. History of sickle cell trait. 11. Leukocytosis: remains Afebrile  - secondary to steroids  WBC's down to 12.7   CXR on 6/2 reviewed, likely atelectasis  ucx on 6/5 without sig growth 12. Constipation: laxative assistance 13. Chronic respiratory failure  Cont supplemental O2  LOS (Days) 13 A FACE TO FACE EVALUATION WAS PERFORMED  Ankit Karis Jubanil Patel 06/23/2015 9:44 AM

## 2015-06-23 NOTE — Plan of Care (Signed)
Problem: RH Balance Goal: LTG Patient will maintain dynamic standing balance (PT) LTG: Patient will maintain dynamic standing balance with assistance during mobility activities (PT)  Outcome: Not Met (add Reason) With RW; not met 2/2 impaired balance & safety awareness  Problem: RH Bed to Chair Transfers Goal: LTG Patient will perform bed/chair transfers w/assist (PT) LTG: Patient will perform bed/chair transfers with assistance, with/without cues (PT).  Outcome: Completed/Met Date Met:  06/23/15 With RW  Problem: RH Car Transfers Goal: LTG Patient will perform car transfers with assist (PT) LTG: Patient will perform car transfers with assistance (PT).  Outcome: Completed/Met Date Met:  06/23/15 With RW  Problem: RH Furniture Transfers Goal: LTG Patient will perform furniture transfers w/assist (OT/PT LTG: Patient will perform furniture transfers with assistance (OT/PT).  Outcome: Not Met (add Reason) With RW; not met 2/2 poor safety awareness  Problem: RH Ambulation Goal: LTG Patient will ambulate in controlled environment (PT) LTG: Patient will ambulate in a controlled environment, # of feet with assistance (PT).  Outcome: Completed/Met Date Met:  06/23/15 200 ft with RW Goal: LTG Patient will ambulate in home environment (PT) LTG: Patient will ambulate in home environment, # of feet with assistance (PT).  Outcome: Completed/Met Date Met:  06/23/15 50 ft with RW  Problem: RH Wheelchair Mobility Goal: LTG Patient will propel w/c in controlled environment (PT) LTG: Patient will propel wheelchair in controlled environment, # of feet with assist (PT)  Outcome: Not Met (add Reason) Pt unwilling to propel w/c with BUE  Problem: RH Stairs Goal: LTG Patient will ambulate up and down stairs w/assist (PT) LTG: Patient will ambulate up and down # of stairs with assistance (PT)  Outcome: Completed/Met Date Met:  06/23/15 3 steps with RW

## 2015-06-23 NOTE — Discharge Summary (Signed)
Discharge summary job [id number]

## 2015-06-23 NOTE — Progress Notes (Signed)
Occupational Therapy Discharge Summary  Patient Details  Name: Juan Cameron MRN: [id number] Date of Birth: 07/31/1975   Patient has met 8 of 8 long term goals due to improved activity tolerance and improved balance.  Patient to discharge at overall Supervision level.  Patient's care partner is independent to provide the necessary physical and cognitive assistance at discharge.    Reasons goals not met: n/a  Recommendation:  Patient will benefit from ongoing skilled OT services in home health setting to continue to advance functional skills in the area of BADL.  Equipment: No equipment provided  Reasons for discharge: treatment goals met  Patient/family agrees with progress made and goals achieved: Yes  OT Discharge Precautions/Restrictions  Precautions Precautions: Fall Precaution Comments: 3L/min supplemental oxygen via nasal cannula Restrictions Weight Bearing Restrictions: No ADL ADL ADL Comments: S overall with BADLs Vision/Perception  Vision- History Baseline Vision/History: No visual deficits Patient Visual Report: No change from baseline Vision- Assessment Vision Assessment?: No apparent visual deficits  Cognition Overall Cognitive Status: History of cognitive impairments - at baseline Arousal/Alertness: Awake/alert Orientation Level: Oriented to person;Oriented to place (oriented to city & "hospital") Attention: Selective Memory: Impaired Awareness: Impaired Problem Solving: Impaired Behaviors: Impulsive Safety/Judgment: Impaired Sensation Sensation Light Touch: Appears Intact Stereognosis: Appears Intact Hot/Cold: Appears Intact Proprioception: Appears Intact Additional Comments: Unable to formally assess due to cognition, however, appears to be [facility] Coordination Gross Motor Movements are Fluid and Coordinated: Yes Fine Motor Movements are Fluid and Coordinated: Yes Motor  Motor Motor:  (improved strength overall) Motor - Discharge Observations:  functional for basic skills Mobility  Bed Mobility Bed Mobility: Rolling Left;Supine to Sit;Sit to Supine Rolling Left: 6: Modified independent (Device/Increase time) Supine to Sit: 6: Modified independent (Device/Increase time) Sit to Supine: 6: Modified independent (Device/Increase time) Transfers Sit to Stand: 5: Supervision Sit to Stand Details: Verbal cues for technique;Verbal cues for sequencing Stand to Sit: 5: Supervision  Trunk/Postural Assessment  Thoracic Assessment Thoracic Assessment:  (slight kyphosis) Lumbar Assessment Lumbar Assessment:  (posterior tilt) Postural Control Postural Control:  (decreased protective responses)  Balance Balance Balance Assessed: Yes Static Sitting Balance Static Sitting - Level of Assistance: 7: Independent Dynamic Sitting Balance Dynamic Sitting - Level of Assistance: 7: Independent Static Standing Balance Static Standing - Balance Support: Bilateral upper extremity supported (on RW) Static Standing - Level of Assistance: 5: Stand by assistance Dynamic Standing Balance Dynamic Standing - Balance Support: Bilateral upper extremity supported (on RW) Dynamic Standing - Level of Assistance: 5: Stand by assistance Dynamic Standing - Balance Activities:  (functional task) Extremity/Trunk Assessment RUE Assessment RUE Assessment: Within Functional Limits LUE Assessment LUE Assessment: Within Functional Limits   See Function Navigator for Current Functional Status.  Adel 06/23/2015, 1:39 PM

## 2015-06-23 NOTE — Progress Notes (Signed)
Speech Language Pathology Discharge Summary  Patient Details  Name: Juan Cameron MRN: [id number] Date of Birth: 09-Dec-1975  Today's Date: 06/23/2015 SLP Individual Time: 0831-0930 SLP Individual Time Calculation (min): 59 min   Skilled Therapeutic Interventions:   Skilled treatment session focused on addressing communication goals and home training for management. SLP facilitated session by providing repetition for new vocal hygiene care/practices.  Patient utilized headlines of a created handout to assist with recall of hydration and throat clearing in conjunction with Min visual cues.  Patient able to carryover that he needs to drink water throughout session and even joked with SLP while ordering soda for lunch, then said haha no I need to drink water.  Given difficulty with recall of recommended practices and difficulty with carryover handout was implemented and left for sister who will be care provider upon discharge.     Patient has met 5 of 5 long term goals.  Patient to discharge at overall Min;Modified Independent level.  Reasons goals not met: n/a   Clinical Impression/Discharge Summary:    Patient has made functional gains during this rehab admission and has met 5 out of 5 long term goals due to improved functional abilities.  Patient is currently Mod I after set-up for consumption of regular textures and thin liquids, which patient has tolerated with intermittent overt s/s of aspiration.  Given baseline cognitive deficits patient will require 24/7 Supervision for all basic cognitive tasks and requires Min assist cues for utilization of vocal hygiene strategies to maximize overall speech intelligibility at the phrase level.  Patient education has been completed and given ongoing deficits and difficulty with recall a handout was left for family.  No further skilled SLP services warranted; however, if vocal changes persist an ENT consult would be recommended.    Care Partner:  Caregiver  Able to Provide Assistance: Yes  Type of Caregiver Assistance: Cognitive  Recommendation:  24 hour supervision/assistance;Other (comment) (ENT if vocal changes persist )     Equipment: none   Reasons for discharge: Treatment goals met;Discharged from hospital   Patient/Family Agrees with Progress Made and Goals Achieved: Yes   Function:  Cognition Comprehension Comprehension assist level: Understands basic 75 - 89% of the time/ requires cueing 10 - 24% of the time  Expression   Expression assist level: Expresses basic 75 - 89% of the time/requires cueing 10 - 24% of the time. Needs helper to occlude trach/needs to repeat words.  Social Interaction Social Interaction assist level: Interacts appropriately 75 - 89% of the time - Needs redirection for appropriate language or to initiate interaction.  Problem Solving Problem solving assist level: Solves basic 50 - 74% of the time/requires cueing 25 - 49% of the time  Memory Memory assist level: Recognizes or recalls 50 - 74% of the time/requires cueing 25 - 49% of the time   Carmelia Roller., CCC-SLP [place] 06/23/2015, 7:43 PM

## 2015-06-23 NOTE — Progress Notes (Signed)
SATURATION QUALIFICATIONS: (This note is used to comply with regulatory documentation for home oxygen)  Patient Saturations on Room Air at Rest = 86%  Patient Saturations on Room Air while Ambulating = 84%  Patient Saturations on 3 Liters of oxygen while Ambulating = 95%  Please briefly explain why patient needs home oxygen:Patient has had a long hospitalization-02/2015 and has had multiple medical issues-respiratory failure, trach and pneumonia while hospitalized. He continues to require continuous-3L O2 to keep his saturations at 95 or above.

## 2015-06-24 ENCOUNTER — Inpatient Hospital Stay (HOSPITAL_COMMUNITY): Payer: Medicare Other

## 2015-06-24 DIAGNOSIS — J9611 Chronic respiratory failure with hypoxia: Secondary | ICD-10-CM

## 2015-06-24 MED ORDER — CLONAZEPAM 0.5 MG PO TABS: 0.5000 mg | ORAL_TABLET | Freq: Every day | ORAL | Status: DC

## 2015-06-24 MED ORDER — PREDNISONE 5 MG PO TABS: ORAL_TABLET | ORAL | Status: DC

## 2015-06-24 MED ORDER — OLANZAPINE 5 MG PO TBDP: 5.0000 mg | ORAL_TABLET | Freq: Two times a day (BID) | ORAL | Status: AC

## 2015-06-24 MED ORDER — ASCORBIC ACID 250 MG PO TABS: 250.0000 mg | ORAL_TABLET | Freq: Two times a day (BID) | ORAL | Status: AC

## 2015-06-24 MED ORDER — PANTOPRAZOLE SODIUM 40 MG PO TBEC: 40.0000 mg | DELAYED_RELEASE_TABLET | Freq: Every day | ORAL | Status: DC

## 2015-06-24 MED ORDER — FERROUS SULFATE 325 (65 FE) MG PO TABS: 325.0000 mg | ORAL_TABLET | Freq: Two times a day (BID) | ORAL | Status: DC

## 2015-06-24 NOTE — Progress Notes (Signed)
[facility] PHYSICAL MEDICINE & REHABILITATION     PROGRESS NOTE  Subjective/Complaints:  Pt sitting up at the edge of his bed his AM. He nods that he is ready to go home. Per nursing patient's sister insistent on getting chest x-ray prior to discharge.  ROS: Appears to denies CP, SOB, nausea, vomiting, diarrhea, however limited communication.  Objective: Vital Signs: Blood pressure 120/78, pulse 107, temperature 97.8 F (36.6 C), temperature source Oral, resp. rate 18, weight 89.2 kg (196 lb 10.4 oz), SpO2 100 %. No results found.  Recent Labs  06/23/15 0635  WBC 12.7*  HGB 9.5*  HCT 30.4*  PLT 274   No results for input(s): NA, K, CL, GLUCOSE, BUN, CREATININE, CALCIUM in the last 72 hours.  Invalid input(s): CO CBG (last 3)   Recent Labs  06/21/15 2044  GLUCAP 116*    Wt Readings from Last 3 Encounters:  06/24/15 89.2 kg (196 lb 10.4 oz)  06/10/15 84.1 kg (185 lb 6.5 oz)  05/16/15 91.536 kg (201 lb 12.8 oz)    Physical Exam:  BP 120/78 mmHg  Pulse 107  Temp(Src) 97.8 F (36.6 C) (Oral)  Resp 18  Wt 89.2 kg (196 lb 10.4 oz)  SpO2 100% Gen: NAD. Well-developed, well-nourished HENT: trach site healed. Normocephalic.  Eyes: Conjunctivae and EOM are normal.  Cardiovascular: Normal rate and regular rhythm  Respiratory: Effort normal. No respiratory distress GI: Soft. Bowel sounds are normal. He exhibits no distension Neurological. Patient was alert. Followed simple commands occasionally. Moving all 4 extremities without difficulty, likely 4+/5 b/l UE, and 4/5 b/l LE (?effort).   Skin. Warm and dry. Psych: pt very flat. Unengaged  Assessment/Plan: 1. Functional deficits secondary to respiratory failure/MRSA pneumonia which require 3+ hours per day of interdisciplinary therapy in a comprehensive inpatient rehab setting. Physiatrist is providing close team supervision and 24 hour management of active medical problems listed below. Physiatrist and rehab  team continue to assess barriers to discharge/monitor patient progress toward functional and medical goals.  Function:  Bathing Bathing position   Position: Shower  Bathing parts Body parts bathed by patient: Right arm, Left arm, Abdomen, Chest, Front perineal area, Right upper leg, Left upper leg, Right lower leg, Left lower leg, Buttocks Body parts bathed by helper: Back  Bathing assist Assist Level: Supervision or verbal cues   Set up : To obtain items  Upper Body Dressing/Undressing Upper body dressing   What is the patient wearing?: Pull over shirt/dress     Pull over shirt/dress - Perfomed by patient: Thread/unthread right sleeve, Thread/unthread left sleeve, Put head through opening, Pull shirt over trunk          Upper body assist Assist Level: Set up   Set up : To obtain clothing/put away  Lower Body Dressing/Undressing Lower body dressing   What is the patient wearing?: Underwear, Pants, Shoes, Socks Underwear - Performed by patient: Thread/unthread right underwear leg, Thread/unthread left underwear leg, Pull underwear up/down   Pants- Performed by patient: Thread/unthread right pants leg, Thread/unthread left pants leg, Pull pants up/down, Fasten/unfasten pants   Non-skid slipper socks- Performed by patient: Don/doff left sock, Don/doff right sock   Socks - Performed by patient: Don/doff right sock, Don/doff left sock   Shoes - Performed by patient: Don/doff right shoe, Don/doff left shoe, Fasten right, Fasten left Shoes - Performed by helper: Fasten right, Fasten left       TED Hose - Performed by helper: Don/doff right TED hose, Don/doff left TED  hose  Lower body assist Assist for lower body dressing: Set up, Supervision or verbal cues   Set up : To obtain clothing/put away  Toileting Toileting Toileting activity did not occur: N/A Toileting steps completed by patient: Adjust clothing prior to toileting, Performs perineal hygiene, Adjust clothing after  toileting Toileting steps completed by helper: Adjust clothing prior to toileting Toileting Assistive Devices: Grab bar or rail  Toileting assist Assist level: Supervision or verbal cues   Transfers Chair/bed transfer Chair/bed transfer activity did not occur: N/A Chair/bed transfer method: Ambulatory, Stand pivot Chair/bed transfer assist level: Supervision or verbal cues Chair/bed transfer assistive device: Patent attorney     Max distance: 200 ft Assist level: Supervision or verbal cues   Wheelchair   Type: Manual Max wheelchair distance: 5 ft Assist Level: Moderate assistance (Pt 50 - 74%)  Cognition Comprehension Comprehension assist level: Understands basic 75 - 89% of the time/ requires cueing 10 - 24% of the time  Expression Expression assist level: Expresses basic 75 - 89% of the time/requires cueing 10 - 24% of the time. Needs helper to occlude trach/needs to repeat words.  Social Interaction Social Interaction assist level: Interacts appropriately 75 - 89% of the time - Needs redirection for appropriate language or to initiate interaction.  Problem Solving Problem solving assist level: Solves basic 50 - 74% of the time/requires cueing 25 - 49% of the time  Memory Memory assist level: Recognizes or recalls 50 - 74% of the time/requires cueing 25 - 49% of the time    Medical Problem List and Plan: 1. Debilitation secondary to respiratory failure/MRSA pneumonia.   Contact precautions. All antibiotic therapy is completed.   -DC today  Taper of prednisone, began on 6/1, decreased again 6/12 2. DVT Prophylaxis/Anticoagulation: Subcutaneous heparin. Monitor for any bleeding episodes 3. Pain Management: Tylenol as needed 4. Mood/severe autism: Klonopin 0.5 mg daily at bedtime, Zyprexa 5 mg twice a day. Patient appears to be at his baseline per family 5. Neuropsych: This patient is not capable of making decisions on his own behalf. 6. Skin/Wound Care:  Routine skin checks 7. Fluids/Electrolytes/Nutrition: Routine I&O's 8. Dysphagia. Advanced to regular diet on 5/31.   Monitor for any signs of aspiration 9. Acute on chronic anemia. Continue iron supplement.   Hgb 9.5 on 6/12  Hemoccult stools not done. Have requested that a sample be collected. (no bm since 6/9) 10. History of sickle cell trait. 11. Leukocytosis: remains Afebrile  -secondary to steroids  WBC's down to 12.7 on 6/12  CXR on 6/2 reviewed, likely atelectasis, will repeat today based on insistence of sister, however given clinical improvement in chronic disease do not expect normal x-ray.  ucx on 6/5 without sig growth 12. Constipation: laxative assistance 13. Chronic respiratory failure  Cont supplemental O2  LOS (Days) 14 A FACE TO FACE EVALUATION WAS PERFORMED  Ankit Karis Juba 06/24/2015 9:26 AM

## 2015-06-24 NOTE — Progress Notes (Signed)
Social Work Patient ID: Juan Cameron, male   DOB: 30-Mar-1975, 40 y.o.   MRN: [id number] [facility]. They have received the referral and have now passed it on to the appropriate person so an intake appointment can be arranged with the sister.

## 2015-06-24 NOTE — Discharge Instructions (Signed)
Inpatient Rehab Discharge Instructions  Juan RipperRobert Cameron Discharge date and time: No discharge date for patient encounter.   Activities/Precautions/ Functional Status: Activity: activity as tolerated Diet: regular diet Wound Care: keep wound clean and dry Functional status:  ___ No restrictions     ___ Walk up steps independently ___ 24/7 supervision/assistance   ___ Walk up steps with assistance ___ Intermittent supervision/assistance  ___ Bathe/dress independently ___ Walk with walker     _x__ Bathe/dress with assistance ___ Walk Independently    ___ Shower independently ___ Walk with assistance    ___ Shower with assistance ___ No alcohol     ___ Return to work/school ________  Special Instructions: Oxygen therapy as directed at 3 L   COMMUNITY REFERRALS UPON DISCHARGE:    Home Health:   PT, OT & RN   Agency:[facility]   Phone:[phone]   Date of last service:06/24/2015  Medical Equipment/Items Ordered:WHEELCHAIR, BEDSIDE COMMODE, HOME OXYGEN & Levan HurstROLLING WALKER  Agency/Supplier:[facility]   [phone] Other:PCS SERVICES-SISTER TO FOLLOW UP WITH  PLACED ON THE CAPS WAITING LIST  My questions have been answered and I understand these instructions. I will adhere to these goals and the provided educational materials after my discharge from the hospital.  Patient/Caregiver Signature _______________________________ Date __________  Clinician Signature _______________________________________ Date __________  Please bring this form and your medication list with you to all your follow-up doctor's appointments.

## 2015-06-24 NOTE — Progress Notes (Signed)
Social Work Patient ID: Alan Ripperobert Toner, male   DOB: 1975-11-19, 40 y.o.   MRN: [id number] Sister made aware rolling walker not covered by insurance due to only cover a wheelchair or a walker. [facility] and [facility] have not found one, sister plans to pay for the walker. [facility] to inform of the plan he will deliver to room today. Sister coming after work today around 4;30 pm.

## 2015-06-24 NOTE — Progress Notes (Signed)
Social Work Patient ID: Juan Cameron, male   DOB: 12/09/1975, 40 y.o.   MRN: [id number] Spoke with [name] regarding delivery of O2 concentrator, she [facility] not [name]. Have [facility] to contact sister to atleast  touch base with and let know the plan for delivery of O2. Will also [facility] due to them informing sister they had not received it.

## 2015-06-24 NOTE — Progress Notes (Signed)
Patient and sister received discharge instructions from Deatra Inaan Angiulli, PA-C with verbal understanding. Sister had more questions at actual time of discharge and called PA for answers. Patient discharged to home with family and belongings.

## 2015-06-24 NOTE — Progress Notes (Signed)
Social Work  Discharge Note  The overall goal for the admission was met for:   Discharge location: Yes-HOME WITH SISTER BETWEEN SHE AND FAMILY/FRIENDS WILL PROVIDE 24 HR SUPERVISION  Length of Stay: Yes-14 DAYS  Discharge activity level: Yes-SUPERVISION LEVEL  Home/community participation: Yes  Services provided included: MD, RD, PT, OT, SLP, RN, CM, TR, Pharmacy and SW  Financial Services: Medicare and Medicaid  Follow-up services arranged: Home Health: [facility]-PT,OT,SP,RN, DME: [facility]-ROLLING WALKER, WHEELCHAIR, HOME O2 AND BEDSIDE COMMODE and Patient/Family has no preference for HH/DME agencies  Comments (or additional information):SISTER WAS [employer]. AWARE HE WILL NEED 24 HR SUPERVISION AT HOME. ALSO ON CAP WAITING LIST. SPOKE WITH KELLY-DSS WILL CONTACT SISTER REGARDING [facility] FOLLOWS THE PT TO SET UP WITH THIS.   Patient/Family verbalized understanding of follow-up arrangements: Yes  Individual responsible for coordination of the follow-up plan: [name]-SISTER/GUARDIAN  Confirmed correct DME delivered: Elease Hashimoto 06/24/2015    Elisa Kutner, [place]

## 2015-07-07 ENCOUNTER — Telehealth: Payer: Self-pay | Admitting: Physical Medicine & Rehabilitation

## 2015-07-07 NOTE — Telephone Encounter (Signed)
Spoke with Thayer Ohmhris and relayed Patel's instructions to titrate to keep above 92%.

## 2015-07-07 NOTE — Telephone Encounter (Signed)
Please advise 

## 2015-07-07 NOTE — Telephone Encounter (Signed)
Elvia Collumhris Stone PT with  [facility] needs to get parameters on patients oxygen, and what range it should be.  She did not have any orders for this with her discharge paperwork.  Please call her at [phone].

## 2015-07-07 NOTE — Telephone Encounter (Signed)
Pt was instructed to follow up with Dr. Levy Pupaobert Byrum, Pulmonary Services.  They will likely evaluate determine and range for supplemental O2.  Until follow, Sats may be kept >92%.

## 2015-07-23 DIAGNOSIS — F900 Attention-deficit hyperactivity disorder, predominantly inattentive type: Secondary | ICD-10-CM

## 2015-07-23 DIAGNOSIS — Z8701 Personal history of pneumonia (recurrent): Secondary | ICD-10-CM

## 2015-07-23 DIAGNOSIS — F840 Autistic disorder: Secondary | ICD-10-CM

## 2015-07-23 DIAGNOSIS — F329 Major depressive disorder, single episode, unspecified: Secondary | ICD-10-CM

## 2015-08-13 ENCOUNTER — Encounter (HOSPITAL_BASED_OUTPATIENT_CLINIC_OR_DEPARTMENT_OTHER): Payer: Self-pay

## 2015-08-15 ENCOUNTER — Encounter (HOSPITAL_BASED_OUTPATIENT_CLINIC_OR_DEPARTMENT_OTHER): Payer: Self-pay

## 2015-08-15 ENCOUNTER — Ambulatory Visit (HOSPITAL_BASED_OUTPATIENT_CLINIC_OR_DEPARTMENT_OTHER): Payer: Medicare Other | Attending: Physician Assistant | Admitting: Internal Medicine

## 2015-08-15 VITALS — Ht 69.0 in | Wt 212.0 lb

## 2015-08-15 DIAGNOSIS — Z6831 Body mass index (BMI) 31.0-31.9, adult: Secondary | ICD-10-CM | POA: Diagnosis not present

## 2015-08-15 DIAGNOSIS — E669 Obesity, unspecified: Secondary | ICD-10-CM | POA: Insufficient documentation

## 2015-08-15 DIAGNOSIS — G4733 Obstructive sleep apnea (adult) (pediatric): Secondary | ICD-10-CM | POA: Diagnosis not present

## 2015-08-15 DIAGNOSIS — G4710 Hypersomnia, unspecified: Secondary | ICD-10-CM

## 2015-08-15 DIAGNOSIS — G4719 Other hypersomnia: Secondary | ICD-10-CM | POA: Diagnosis not present

## 2015-08-15 DIAGNOSIS — R5383 Other fatigue: Secondary | ICD-10-CM | POA: Insufficient documentation

## 2015-08-15 DIAGNOSIS — R0683 Snoring: Secondary | ICD-10-CM | POA: Insufficient documentation

## 2015-08-23 DIAGNOSIS — R0683 Snoring: Secondary | ICD-10-CM

## 2015-08-23 NOTE — Procedures (Signed)
Patient Name: Juan Cameron, Juan Cameron Date: 08/15/2015 Gender: Male D.O.B: 1975/08/23 Age (years): 40 Referring Provider: Thomos Lemons PA-C Height (inches): 42 Interpreting Physician: Baird Lyons MD, ABSM Weight (lbs): 212 RPSGT: Baxter Flattery BMI: 31 MRN: [id number] Neck Size: 17.00 CLINICAL INFORMATION Sleep Study Type: Split Night CPAP Indication for sleep study: Excessive Daytime Sleepiness, Fatigue, Obesity, OSA, Snoring, Witnessed Apneas Epworth Sleepiness Score: 13  SLEEP STUDY TECHNIQUE As per the AASM Manual for the Scoring of Sleep and Associated Events v2.3 (April 2016) with a hypopnea requiring 4% desaturations. The channels recorded and monitored were frontal, central and occipital EEG, electrooculogram (EOG), submentalis EMG (chin), nasal and oral airflow, thoracic and abdominal wall motion, anterior tibialis EMG, snore microphone, electrocardiogram, and pulse oximetry. Continuous positive airway pressure (CPAP) was initiated when the patient met split night criteria and was titrated according to treat sleep-disordered breathing.  MEDICATIONS Medications taken by the patient : charted for review Medications administered by patient during sleep study : No sleep medicine administered.  RESPIRATORY PARAMETERS Diagnostic Total AHI (/hr): 26.6 RDI (/hr): 26.6 OA Index (/hr): 21.3 CA Index (/hr): 0.7 REM AHI (/hr): 92.1 NREM AHI (/hr): 13.8 Supine AHI (/hr): 26.6 Non-supine AHI (/hr): N/A Min O2 Sat (%): 67.00 Mean O2 (%): 89.83 Time below 88% (min): 38.9   Titration Optimal Pressure (cm): 14 AHI at Optimal Pressure (/hr): 29.8 Min O2 at Optimal Pressure (%): 87.0 Supine % at Optimal (%): 100 Sleep % at Optimal (%): 100    SLEEP ARCHITECTURE The recording time for the entire night was 385.4 minutes. During a baseline period of 211.6 minutes, the patient slept for 171.5 minutes in REM and nonREM, yielding a sleep efficiency of 81.1%. Sleep onset after lights out was 0.3  minutes with a REM latency of 93.0 minutes. The patient spent 4.96% of the night in stage N1 sleep, 77.26% in stage N2 sleep, 1.46% in stage N3 and 16.33% in REM. During the titration period of 165.0 minutes, the patient slept for 127.1 minutes in REM and nonREM, yielding a sleep efficiency of 77.0%. Sleep onset after CPAP initiation was 13.4 minutes with a REM latency of 129.0 minutes. The patient spent 4.76% of the night in stage N1 sleep, 77.53% in stage N2 sleep, 0.39% in stage N3 and 17.32% in REM.  CARDIAC DATA The 2 lead EKG demonstrated sinus rhythm. The mean heart rate was 96.06 beats per minute. Other EKG findings include: None.  LEG MOVEMENT DATA The total Periodic Limb Movements of Sleep (PLMS) were 16. The PLMS index was 3.22 .  IMPRESSIONS - Moderate obstructive sleep apnea occurred during the diagnostic portion of the study(AHI = 26.6/hour). An optimal PAP pressure was selected for this patient ( 14 cm of water) - No significant central sleep apnea occurred during the diagnostic portion of the study (CAI = 0.7/hour). - Moderate oxygen desaturation was noted during the diagnostic portion of the study (Min O2 =67.00%). Supplemental O2 added per protocol for sustained low saturation before CPAP. - No snoring was audible during the diagnostic portion of the study. - No cardiac abnormalities were noted during this study. - Mild periodic limb movements of sleep occurred during the study.  DIAGNOSIS - Obstructive Sleep Apnea (327.23 [G47.33 ICD-10])  RECOMMENDATIONS - Trial of CPAP therapy on 14 cm H2O with a Large size Resmed Full Face Mask AirFit F20 mask and heated humidification. - Avoid alcohol, sedatives and other CNS depressants that may worsen sleep apnea and disrupt normal sleep architecture. - Sleep hygiene should  be reviewed to assess factors that may improve sleep quality. - Weight management and regular exercise should be initiated or continued.  [Electronically signed]  08/23/2015 10:06 AM  Baird Lyons MD, ABSM Diplomate, American Board of Sleep Medicine   NPI: [id number]  [place], American Board of Sleep Medicine  ELECTRONICALLY SIGNED ON:  08/23/2015, 9:58 AM [place] PH: [phone]   FX: [phone] [place]

## 2015-12-04 ENCOUNTER — Other Ambulatory Visit: Payer: Self-pay | Admitting: Physical Medicine & Rehabilitation

## 2017-05-30 IMAGING — DX DG CHEST 2V
2 series · 2 of 2 positions shown · non-contrast
Comparison: 03/07/2015 chest radiograph.

CLINICAL DATA: Fever.  Chest pain.

EXAM:
CHEST  2 VIEW

[chest pa]
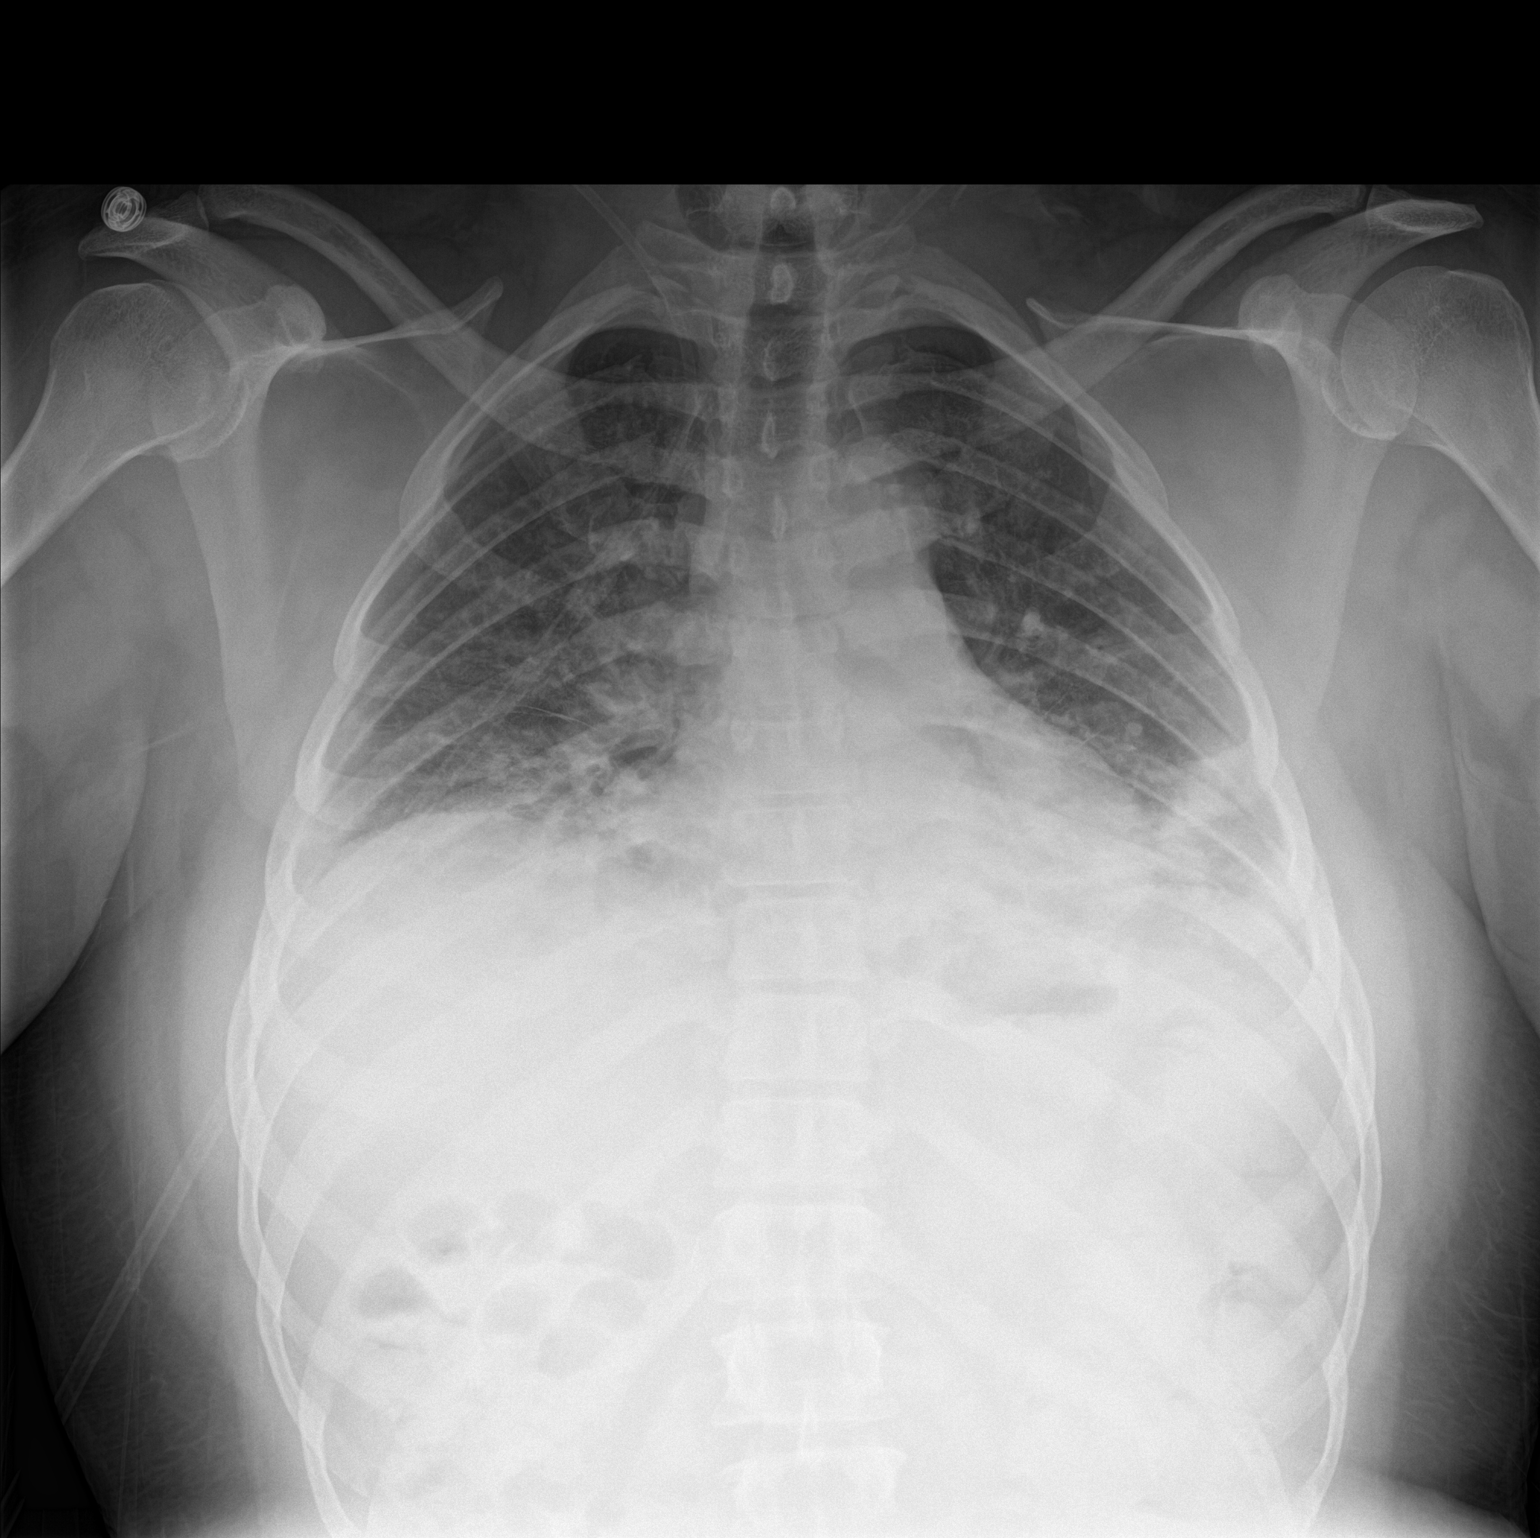

[chest lat]
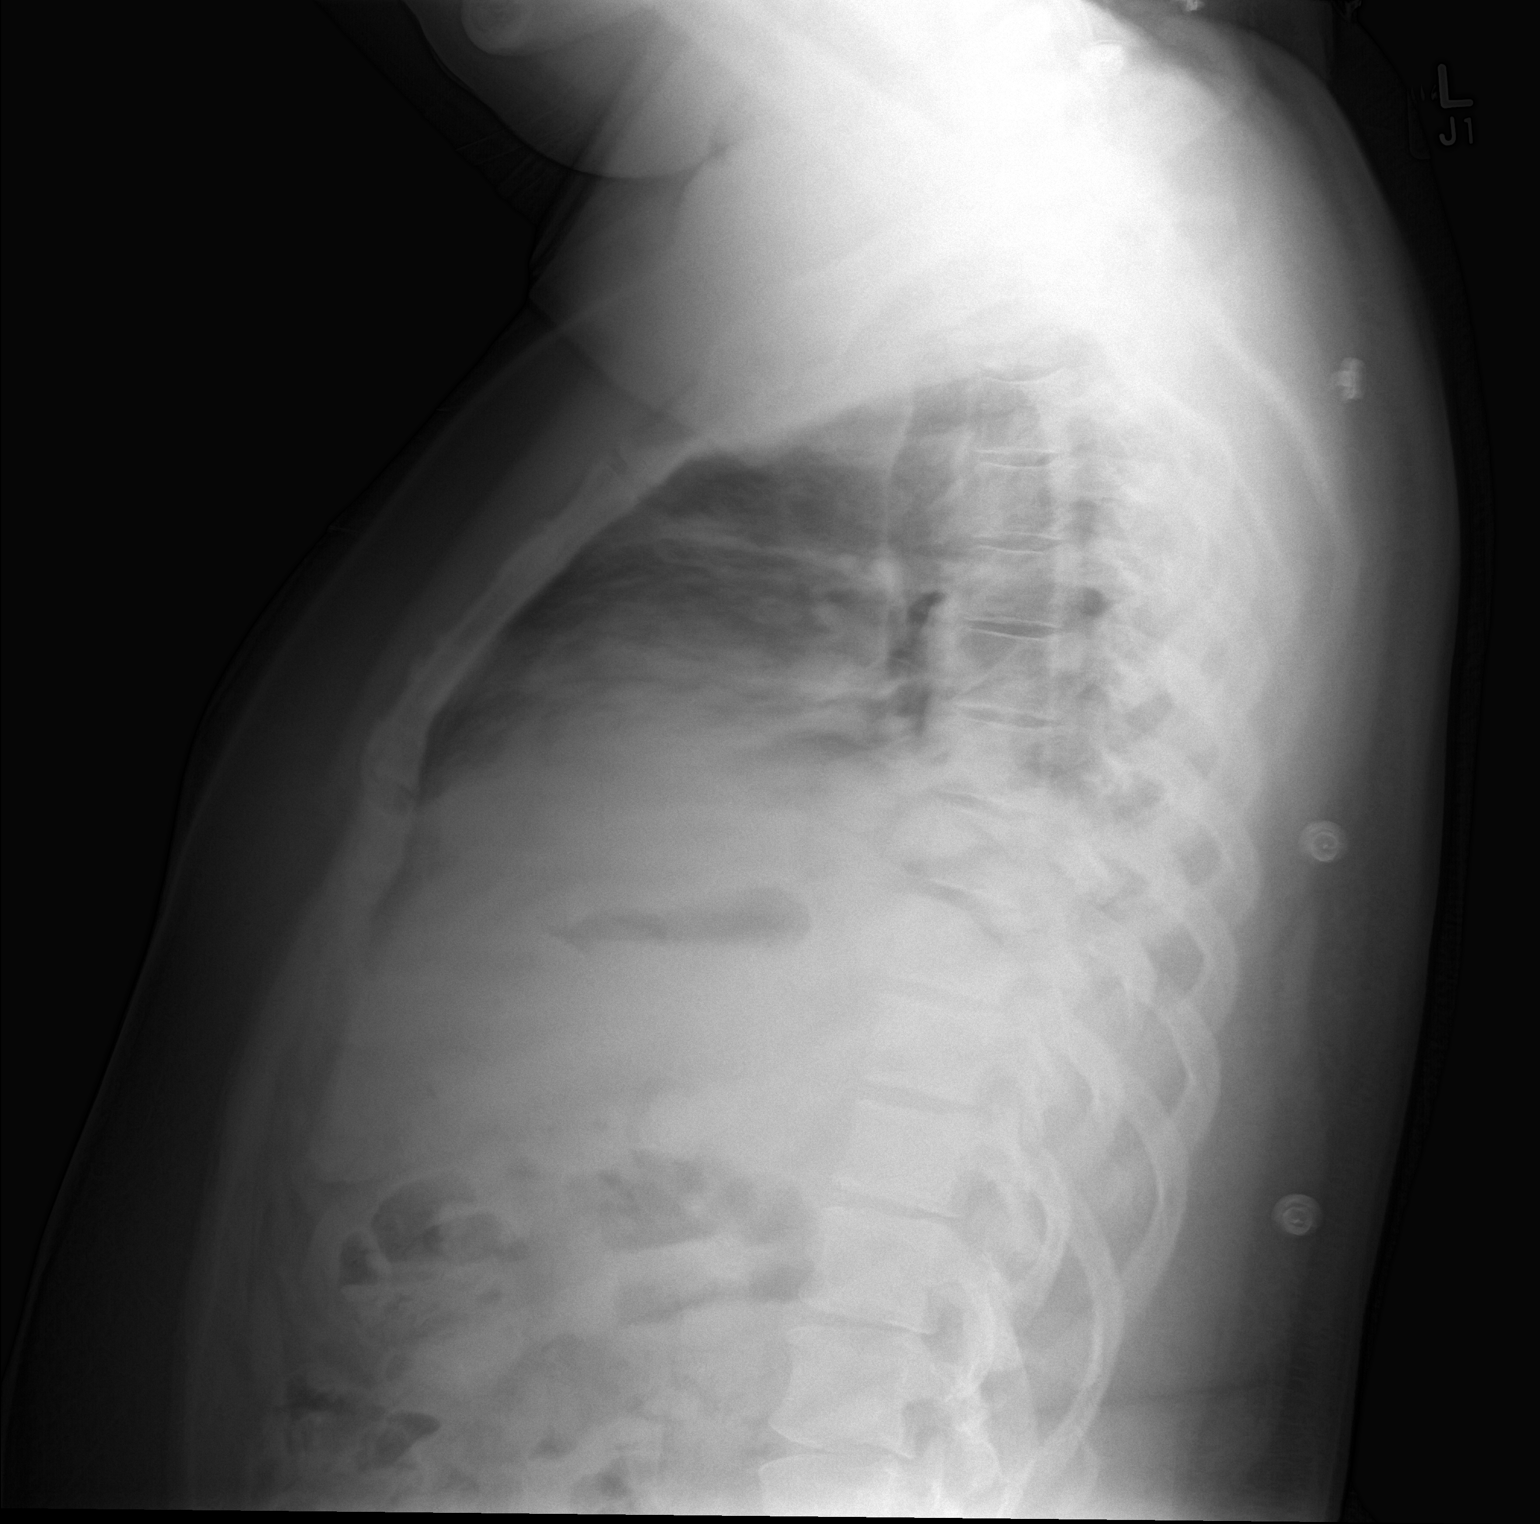

[2 of 2 positions shown; findings below may reference images not displayed]

FINDINGS: Low lung volumes. Stable cardiomediastinal silhouette with normal
heart size. No pneumothorax. No definite pleural effusions. Patchy
bibasilar lung consolidation, not appreciably changed. No overt
pulmonary edema.
IMPRESSION: Low lung volumes with stable patchy bibasilar lung consolidation,
suspicious for a combination of atelectasis and pneumonia.

## 2017-05-31 IMAGING — CR DG CHEST 1V PORT
1 series · 1 of 1 positions shown · non-contrast
Comparison: Chest radiograph performed 03/10/2015

CLINICAL DATA: Acute onset of shortness of breath. Decreased O2
saturation. Initial encounter.

EXAM:
PORTABLE CHEST 1 VIEW

[AP]
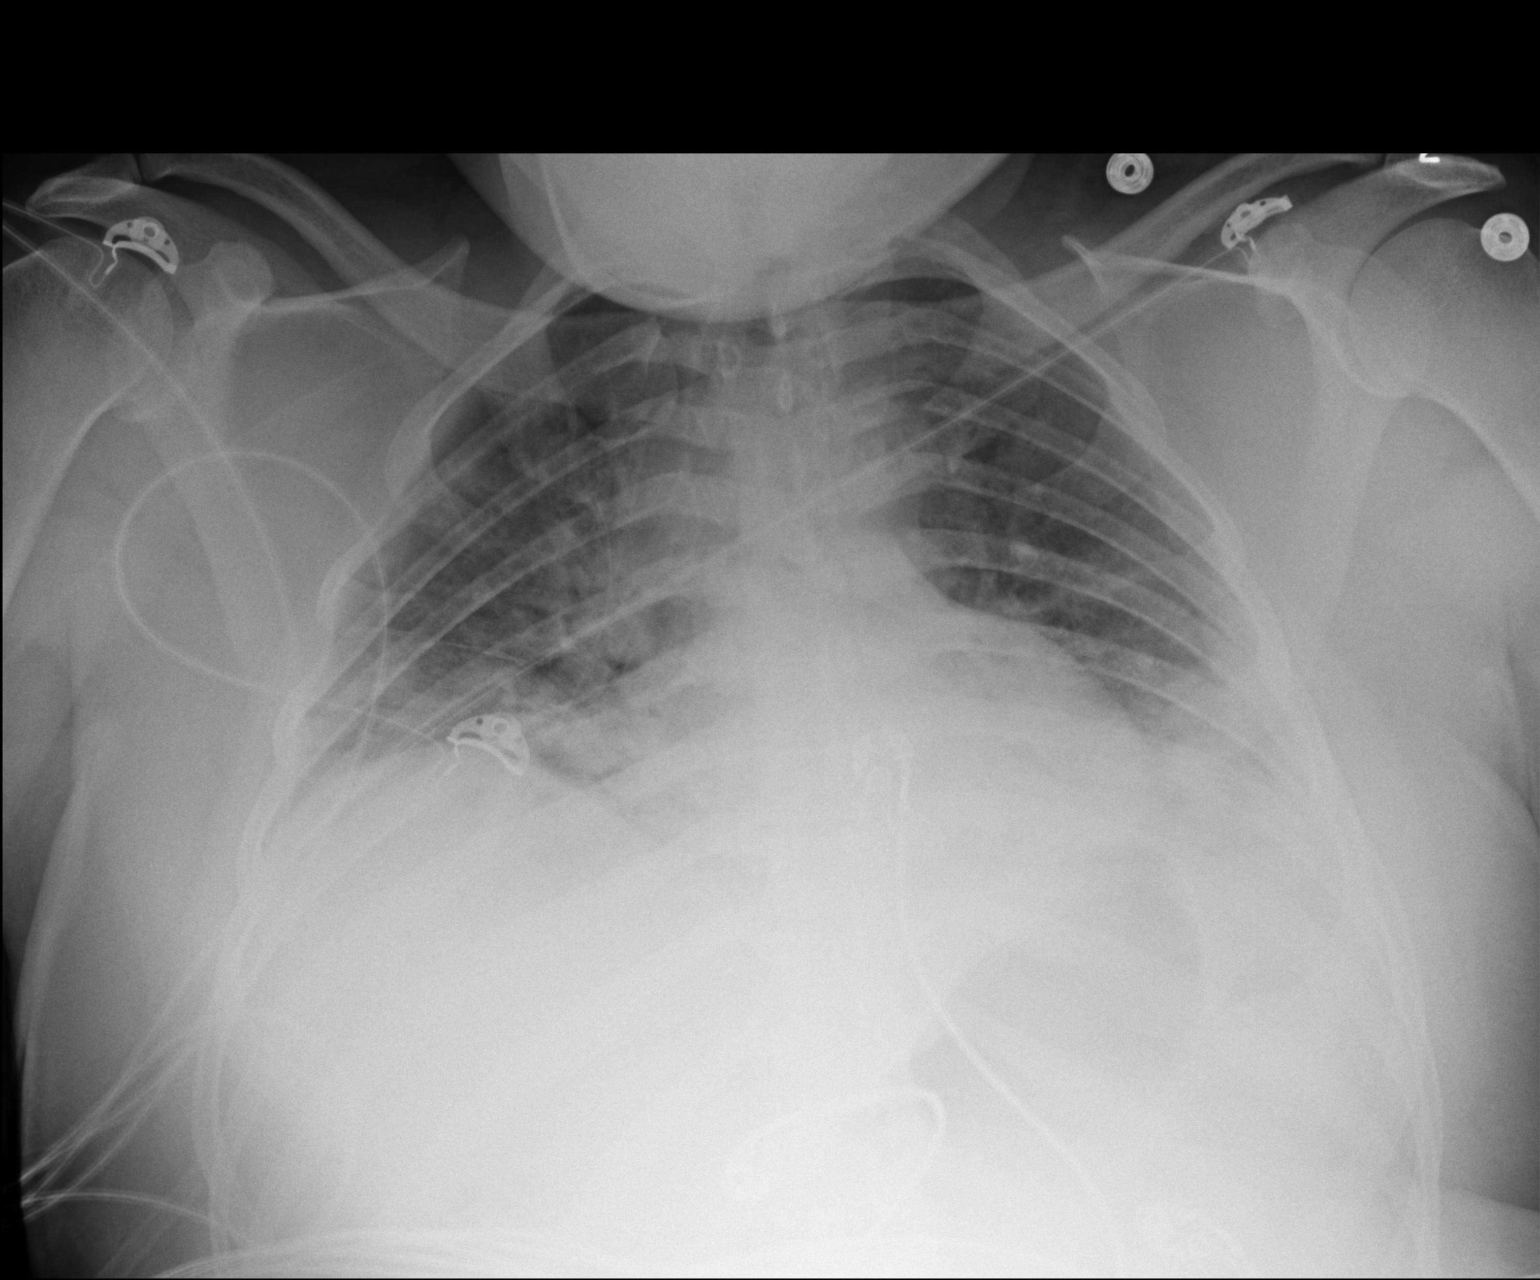

[1 of 1 positions shown; findings below may reference images not displayed]

FINDINGS: The lungs are hypoexpanded. Bibasilar airspace opacification may
reflect pneumonia or possibly pulmonary edema. A small left pleural
effusion is suspected. No pneumothorax is seen.

The cardiomediastinal silhouette is borderline normal in size. No
acute osseous abnormalities are identified.
IMPRESSION: Lungs hypoexpanded. Bibasilar airspace opacities may reflect
pneumonia or possibly pulmonary edema, similar in appearance to the
prior study. Small left pleural effusion suspected.

## 2017-06-01 IMAGING — CR DG CHEST 1V PORT
1 series · 1 of 1 positions shown · non-contrast
Comparison: Chest radiograph from earlier today.

CLINICAL DATA: Acute respiratory failure

EXAM:
PORTABLE CHEST 1 VIEW

[AP]
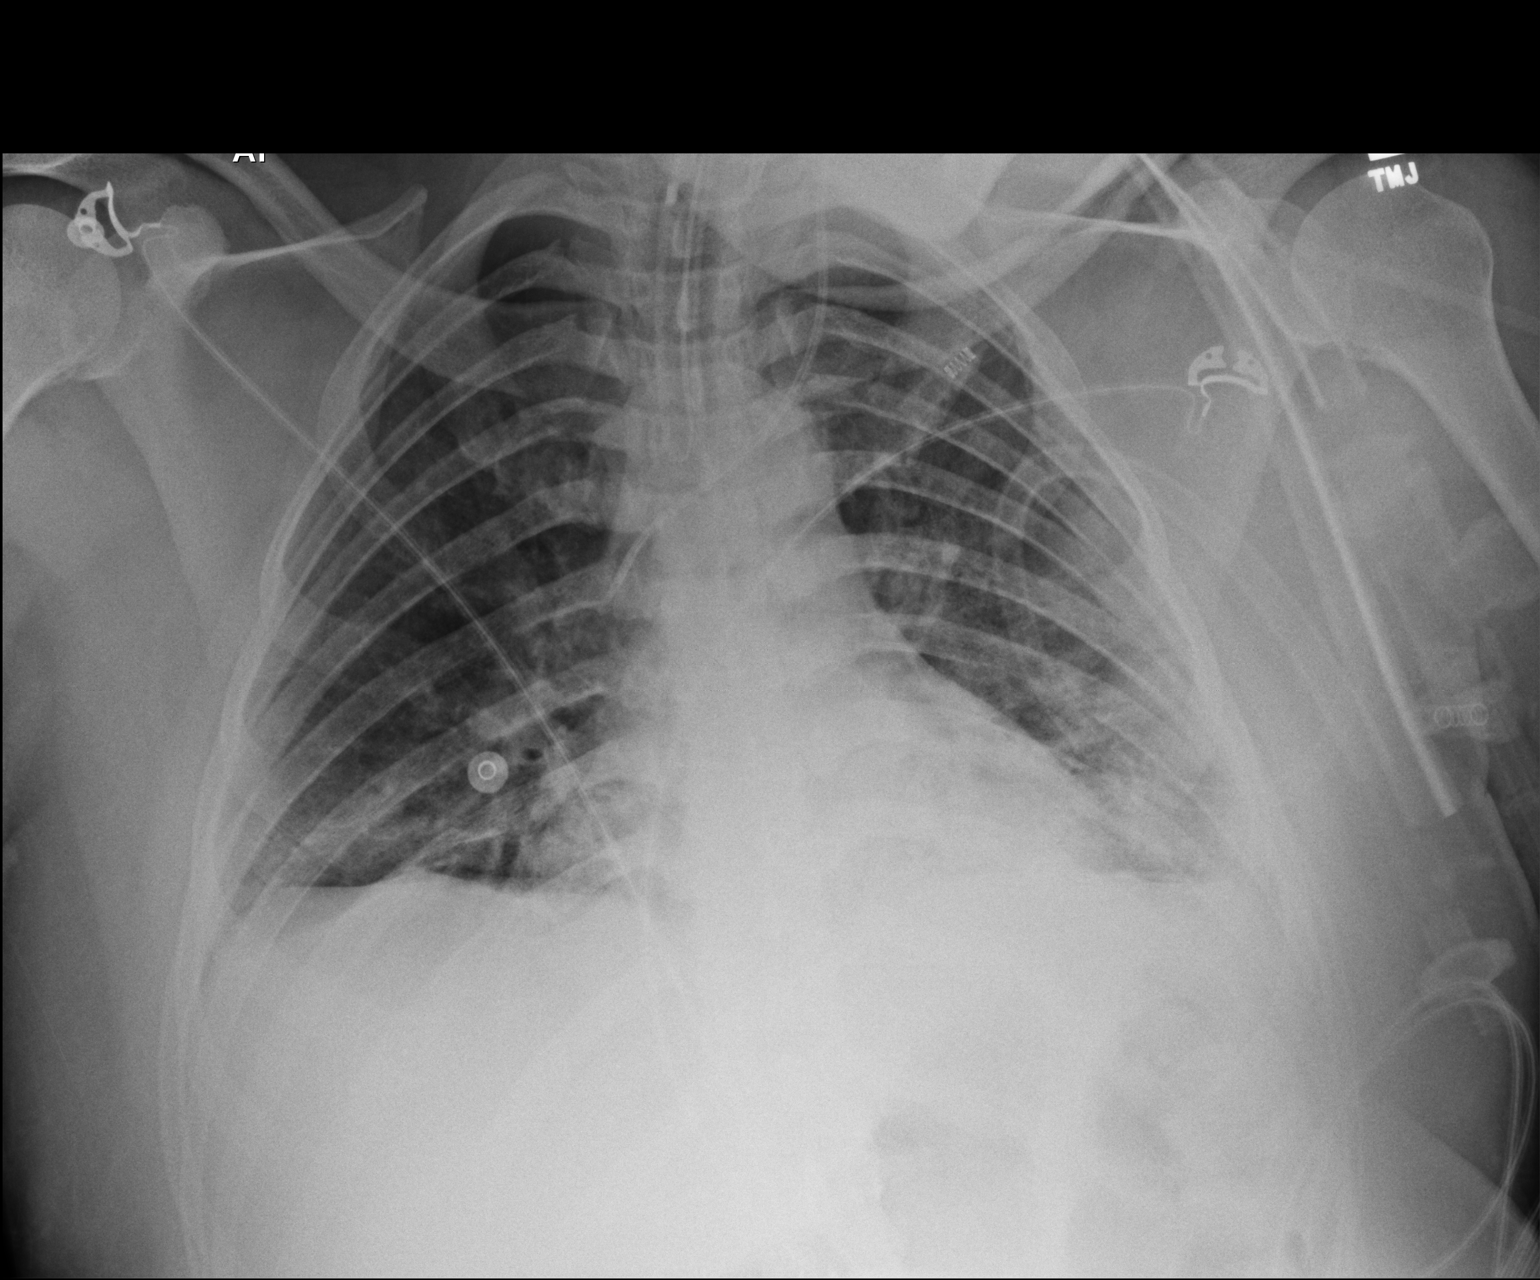

[1 of 1 positions shown; findings below may reference images not displayed]

FINDINGS: Endotracheal tube tip is 2.4 cm above the carina. Left internal
jugular central venous catheter terminates in the middle third of
the superior vena cava. Stable cardiomediastinal silhouette with
normal heart size. No pneumothorax. No pleural effusion. Patchy
consolidation at the left greater than right lung bases, not
appreciably changed. Overall improved lung volumes. No pulmonary
edema.
IMPRESSION: 1. Well-positioned support structures.  Improved lung volumes.
2. Persistent patchy consolidation at the left greater than right
lung bases, suspicious for multifocal pneumonia versus aspiration.

## 2017-06-02 IMAGING — CR DG CHEST 1V PORT
1 series · 1 of 1 positions shown · non-contrast
Comparison: Portable chest x-ray March 12, 2015

CLINICAL DATA: Acute respiratory failure with hypoxia, healthcare
associated pneumonia, ARDS.

EXAM:
PORTABLE CHEST 1 VIEW

[AP]
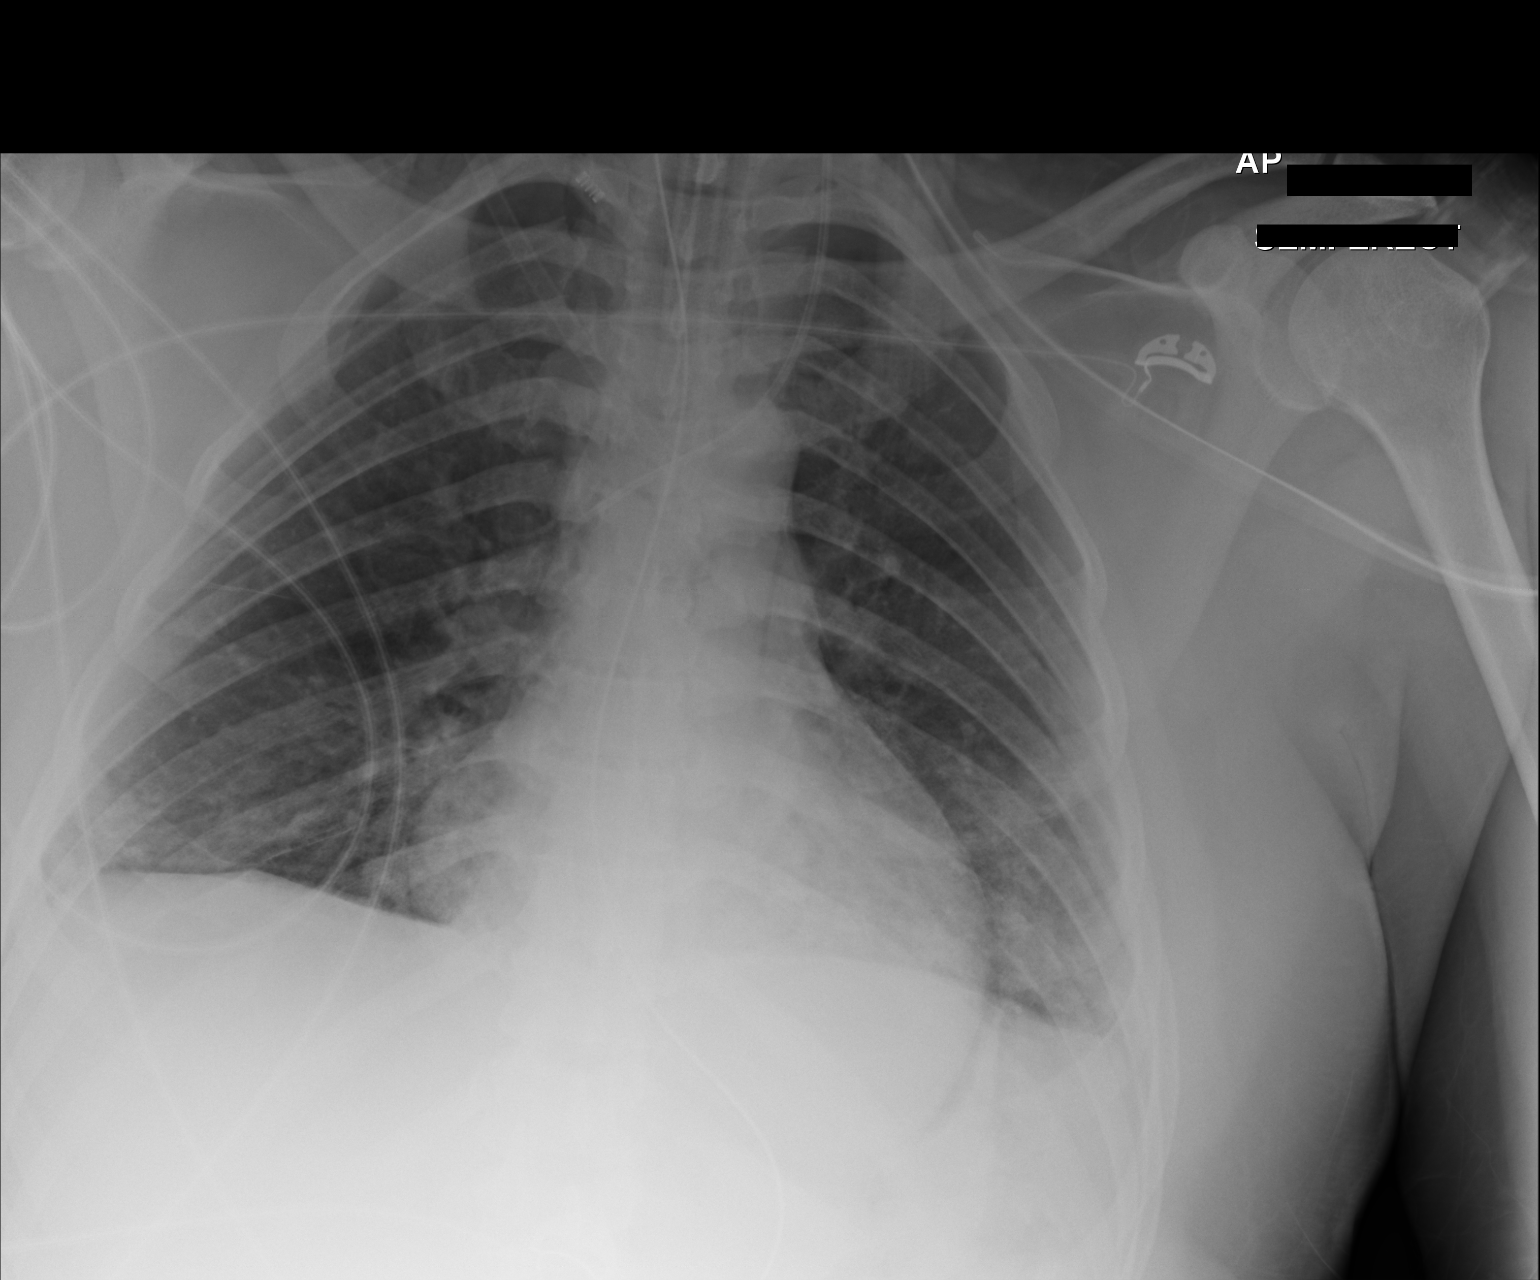

[1 of 1 positions shown; findings below may reference images not displayed]

FINDINGS: The lungs are well-expanded. Subtle interstitial density at both
lung bases is less conspicuous today. There is no pleural effusion
or pneumothorax. The cardiac silhouette is at the upper limits of
normal for size but stable. The pulmonary vascularity is not
engorged. The endotracheal tube tip lies 5.5 cm above the carina.
The left internal jugular venous catheter tip projects over the
proximal SVC. The esophagogastric tube tip projects below the
inferior margin of the image.
IMPRESSION: Further slight interval improvement in the appearance of the
bibasilar interstitial infiltrates. There is no significant pleural
effusion nor pulmonary edema. The support tubes are in stable
position.

## 2019-02-14 ENCOUNTER — Inpatient Hospital Stay
Admission: AD | Admit: 2019-02-14 | Discharge: 2019-03-01 | Disposition: A | Discharge status: Rehabilitation Facility | Payer: Medicare Other | Source: Other Acute Inpatient Hospital | Attending: Internal Medicine | Admitting: Internal Medicine

## 2019-02-14 ENCOUNTER — Other Ambulatory Visit (HOSPITAL_COMMUNITY): Payer: Medicare Other

## 2019-02-14 DIAGNOSIS — Z4659 Encounter for fitting and adjustment of other gastrointestinal appliance and device: Secondary | ICD-10-CM

## 2019-02-14 DIAGNOSIS — J189 Pneumonia, unspecified organism: Secondary | ICD-10-CM

## 2019-02-14 DIAGNOSIS — F840 Autistic disorder: Secondary | ICD-10-CM | POA: Diagnosis present

## 2019-02-14 DIAGNOSIS — J1282 Pneumonia due to coronavirus disease 2019: Secondary | ICD-10-CM | POA: Diagnosis present

## 2019-02-14 DIAGNOSIS — J9621 Acute and chronic respiratory failure with hypoxia: Secondary | ICD-10-CM | POA: Diagnosis present

## 2019-02-14 DIAGNOSIS — Z0189 Encounter for other specified special examinations: Secondary | ICD-10-CM

## 2019-02-14 DIAGNOSIS — T85598A Other mechanical complication of other gastrointestinal prosthetic devices, implants and grafts, initial encounter: Secondary | ICD-10-CM

## 2019-02-14 DIAGNOSIS — J8410 Pulmonary fibrosis, unspecified: Secondary | ICD-10-CM | POA: Diagnosis present

## 2019-02-14 DIAGNOSIS — U071 COVID-19: Secondary | ICD-10-CM | POA: Diagnosis present

## 2019-02-14 HISTORY — DX: Acute and chronic respiratory failure with hypoxia: J96.21

## 2019-02-14 HISTORY — DX: Pneumonia due to coronavirus disease 2019: J12.82

## 2019-02-14 HISTORY — DX: Pulmonary fibrosis, unspecified: J84.10

## 2019-02-14 HISTORY — DX: COVID-19: U07.1

## 2019-02-15 ENCOUNTER — Other Ambulatory Visit (HOSPITAL_COMMUNITY): Payer: Medicare Other

## 2019-02-15 DIAGNOSIS — U071 COVID-19: Secondary | ICD-10-CM | POA: Diagnosis not present

## 2019-02-15 DIAGNOSIS — J9621 Acute and chronic respiratory failure with hypoxia: Secondary | ICD-10-CM

## 2019-02-15 DIAGNOSIS — F840 Autistic disorder: Secondary | ICD-10-CM

## 2019-02-15 DIAGNOSIS — J1282 Pneumonia due to coronavirus disease 2019: Secondary | ICD-10-CM

## 2019-02-15 DIAGNOSIS — J8410 Pulmonary fibrosis, unspecified: Secondary | ICD-10-CM | POA: Diagnosis not present

## 2019-02-15 LAB — URINALYSIS, ROUTINE W REFLEX MICROSCOPIC
Bacteria, UA: NONE SEEN
Bilirubin Urine: NEGATIVE
Glucose, UA: NEGATIVE mg/dL
Ketones, ur: NEGATIVE mg/dL
Leukocytes,Ua: NEGATIVE
Nitrite: NEGATIVE
Protein, ur: NEGATIVE mg/dL
RBC / HPF: >50 RBC/hpf — ABNORMAL HIGH (ref 0–5)
Specific Gravity, Urine: 1.012 (ref 1.005–1.030)
pH: 6 (ref 5.0–8.0)

## 2019-02-15 LAB — BASIC METABOLIC PANEL
Anion gap: 11 (ref 5–15)
BUN: 11 mg/dL (ref 6–20)
CO2: 27 mmol/L (ref 22–32)
Calcium: 9.4 mg/dL (ref 8.9–10.3)
Chloride: 108 mmol/L (ref 98–111)
Creatinine, Ser: 1.01 mg/dL (ref 0.61–1.24)
GFR calc Af Amer: >60 mL/min (ref >60)
GFR calc non Af Amer: >60 mL/min (ref >60)
Glucose, Bld: 101 mg/dL — ABNORMAL HIGH (ref 70–99)
Potassium: 4.2 mmol/L (ref 3.5–5.1)
Sodium: 146 mmol/L — ABNORMAL HIGH (ref 135–145)

## 2019-02-15 LAB — LACTIC ACID, PLASMA
Lactic Acid, Venous: 1.6 mmol/L (ref 0.5–1.9)
Lactic Acid, Venous: 1.6 mmol/L (ref 0.5–1.9)

## 2019-02-15 LAB — CBC
HCT: 29.3 % — ABNORMAL LOW (ref 39.0–52.0)
Hemoglobin: 9.6 g/dL — ABNORMAL LOW (ref 13.0–17.0)
MCH: 27 pg (ref 26.0–34.0)
MCHC: 32.8 g/dL (ref 30.0–36.0)
MCV: 82.5 fL (ref 80.0–100.0)
Platelets: 283 10*3/uL (ref 150–400)
RBC: 3.55 MIL/uL — ABNORMAL LOW (ref 4.22–5.81)
RDW: 16.6 % — ABNORMAL HIGH (ref 11.5–15.5)
WBC: 13.8 10*3/uL — ABNORMAL HIGH (ref 4.0–10.5)
nRBC: 0.1 % (ref 0.0–0.2)

## 2019-02-15 LAB — TSH: TSH: 3.741 u[IU]/mL (ref 0.350–4.500)

## 2019-02-15 LAB — PROCALCITONIN: Procalcitonin: 0.1 ng/mL

## 2019-02-15 MED ORDER — GENERIC EXTERNAL MEDICATION
0.25 | Status: DC
Start: 2019-02-14 — End: 2019-02-15

## 2019-02-15 MED ORDER — SODIUM CHLORIDE FLUSH 0.9 % IV SOLN
20.00 | INTRAVENOUS | Status: DC
Start: ? — End: 2019-02-15

## 2019-02-15 MED ORDER — OXYCODONE HCL 5 MG PO TABS
10.00 | ORAL_TABLET | ORAL | Status: DC
Start: 2019-02-14 — End: 2019-02-15

## 2019-02-15 MED ORDER — GENERIC EXTERNAL MEDICATION
Status: DC
Start: ? — End: 2019-02-15

## 2019-02-15 MED ORDER — CHLORHEXIDINE GLUCONATE 0.12 % MT SOLN
15.00 | OROMUCOSAL | Status: DC
Start: ? — End: 2019-02-15

## 2019-02-15 MED ORDER — CHLORHEXIDINE GLUCONATE 0.12 % MT SOLN
15.00 | OROMUCOSAL | Status: DC
Start: 2019-02-14 — End: 2019-02-15

## 2019-02-15 MED ORDER — ENOXAPARIN SODIUM 40 MG/0.4ML ~~LOC~~ SOLN
40.00 | SUBCUTANEOUS | Status: DC
Start: 2019-02-14 — End: 2019-02-15

## 2019-02-15 MED ORDER — FERROUS SULFATE 325 (65 FE) MG PO TABS
325.00 | ORAL_TABLET | ORAL | Status: DC
Start: 2019-02-15 — End: 2019-02-15

## 2019-02-15 MED ORDER — PRO-STAT PO LIQD
ORAL | Status: DC
Start: 2019-02-15 — End: 2019-02-15

## 2019-02-15 MED ORDER — THROMBIN 10000 UNITS EX KIT
PACK | CUTANEOUS | Status: DC
Start: ? — End: 2019-02-15

## 2019-02-15 MED ORDER — HYDROMORPHONE HCL 1 MG/ML IJ SOLN
1.00 | INTRAMUSCULAR | Status: DC
Start: ? — End: 2019-02-15

## 2019-02-15 MED ORDER — DEXTROSE 10 % IV SOLN
125.00 | INTRAVENOUS | Status: DC
Start: ? — End: 2019-02-15

## 2019-02-15 MED ORDER — GENERIC EXTERNAL MEDICATION
40.00 | Status: DC
Start: 2019-02-15 — End: 2019-02-15

## 2019-02-15 MED ORDER — INSULIN LISPRO 100 UNIT/ML ~~LOC~~ SOLN
2.00 | SUBCUTANEOUS | Status: DC
Start: 2019-02-14 — End: 2019-02-15

## 2019-02-15 MED ORDER — GLUCOSE 40 % PO GEL
15.00 | ORAL | Status: DC
Start: ? — End: 2019-02-15

## 2019-02-15 MED ORDER — BUDESONIDE 0.5 MG/2ML IN SUSP
0.50 | RESPIRATORY_TRACT | Status: DC
Start: 2019-02-14 — End: 2019-02-15

## 2019-02-15 MED ORDER — SODIUM CHLORIDE FLUSH 0.9 % IV SOLN
20.00 | INTRAVENOUS | Status: DC
Start: 2019-02-15 — End: 2019-02-15

## 2019-02-15 MED ORDER — CITALOPRAM HYDROBROMIDE 10 MG PO TABS
10.00 | ORAL_TABLET | ORAL | Status: DC
Start: 2019-02-15 — End: 2019-02-15

## 2019-02-15 NOTE — Consult Note (Signed)
Pulmonary [place]  Date of Service: 02/15/2019  PULMONARY CRITICAL CARE Juan Cameron  FYB:[id number]  DOB: 1975/06/06   DOA: 02/14/2019  Referring Physician: [name], MD  HPI: Juan Cameron is a 44 y.o. male seen for follow up of Acute on Chronic Respiratory Failure.  Patient has multiple medical problems including autism obstructive sleep apnea presented to the hospital because of increasing shortness of breath.  Patient was apparently found to have COVID-19 pneumonia and was also noted to have multifocal bacterial pneumonia.  Patient's hospital course was complicated by development of sepsis with septic shock also had ARDS.  Hospital course patient had increased work of breathing ended up intubated on mechanical ventilation subsequently had to have a tracheostomy because patient was not able to wean off the ventilator.  Since that time patient has been started on spontaneous breathing trials and appears to be doing relatively well transferred to our facility for further management and weaning  Review of Systems:  ROS performed and is unremarkable other than noted above.  Past Medical History:  Diagnosis Date  . Autism   . Pneumonia 02/2015  . Sickle cell trait [name])     Past Surgical History:  Procedure Laterality Date  . NO PAST SURGERIES      Social History:    reports that he has never smoked. He has never used smokeless tobacco. He reports that he does not drink alcohol or use drugs.  Family History: Non-Contributory to the present illness  Allergies  Allergen Reactions  . Peanuts [Peanut Oil] Anaphylaxis    Medications: Reviewed on Rounds  Physical Exam:  Vitals: Temperature 99.2 pulse 99 respiratory 23 blood pressure is 140/81 saturations 100%  Ventilator Settings off the ventilator on the NAG on 2 L  . General: Comfortable at this time . Eyes: Grossly normal lids, irises &  conjunctiva . ENT: grossly tongue is normal . Neck: no obvious mass . Cardiovascular: S1-S2 normal no gallop or rub . Respiratory: No rhonchi no rales . Abdomen: Soft nontender . Skin: no rash seen on limited exam . Musculoskeletal: not rigid . Psychiatric:unable to assess . Neurologic: no seizure no involuntary movements         Labs on Admission:  Basic Metabolic Panel: Recent Labs  Lab 02/15/19 0536  NA 146*  K 4.2  CL 108  CO2 27  GLUCOSE 101*  BUN 11  CREATININE 1.01  CALCIUM 9.4    No results for input(s): PHART, PCO2ART, PO2ART, HCO3, O2SAT in the last 168 hours.  Liver Function Tests: No results for input(s): AST, ALT, ALKPHOS, BILITOT, PROT, ALBUMIN in the last 168 hours. No results for input(s): LIPASE, AMYLASE in the last 168 hours. No results for input(s): AMMONIA in the last 168 hours.  CBC: Recent Labs  Lab 02/15/19 0536  WBC 13.8*  HGB 9.6*  HCT 29.3*  MCV 82.5  PLT 283    Cardiac Enzymes: No results for input(s): CKTOTAL, CKMB, CKMBINDEX, TROPONINI in the last 168 hours.  BNP (last 3 results) No results for input(s): BNP in the last 8760 hours.  ProBNP (last 3 results) No results for input(s): PROBNP in the last 8760 hours.   Radiological Exams on Admission: DG Abd Portable 1V  Result Date: 02/14/2019 CLINICAL DATA:  NG tube placement EXAM: PORTABLE ABDOMEN - 1 VIEW COMPARISON:  None. FINDINGS: Tip the NG tube is seen within the distal stomach. Mildly dilated air-filled loops of  small bowel in the mid abdomen. No radio-opaque calculi or other significant radiographic abnormality are seen. IMPRESSION: Tip the NG tube within the distal stomach. Electronically Signed   By: Jonna Clark M.D.   On: 02/14/2019 22:41    Assessment/Plan Active Problems:   Acute on chronic respiratory failure with hypoxia (HCC)   COVID-19 virus infection   Pneumonia due to COVID-19 virus   Postinflammatory pulmonary fibrosis (HCC)   Autism   1. Acute on  chronic respiratory failure with hypoxia patient is weaning on the NAG currently is on 2 L oxygen plan is going to be to continue to advance the wean as tolerated.  Patient looks like he is doing well so far 2. COVID-19 pneumonia treated with superinfection of bacterial pneumonia patient still going to be monitored closely with chest x-ray.  Apparently grew MRSA pneumonia in the cultures. 3. Postinflammatory pulmonary fibrosis likely related to underlying COVID-19 infection we will continue to monitor closely. 4. Autism patient is at baseline we will continue to monitor  I have personally seen and evaluated the patient, evaluated laboratory and imaging results, formulated the assessment and plan and placed orders. The Patient requires high complexity decision making with multiple systems involvement.  Case was discussed on Rounds with the Respiratory Therapy Director and the Respiratory staff Time Spent  [name], MD [facility] Pulmonary Critical Care Medicine Sleep Medicine

## 2019-02-16 ENCOUNTER — Encounter: Payer: Self-pay | Admitting: Internal Medicine

## 2019-02-16 DIAGNOSIS — F840 Autistic disorder: Secondary | ICD-10-CM | POA: Diagnosis not present

## 2019-02-16 DIAGNOSIS — J9621 Acute and chronic respiratory failure with hypoxia: Secondary | ICD-10-CM | POA: Diagnosis not present

## 2019-02-16 DIAGNOSIS — U071 COVID-19: Secondary | ICD-10-CM | POA: Diagnosis present

## 2019-02-16 DIAGNOSIS — J8410 Pulmonary fibrosis, unspecified: Secondary | ICD-10-CM | POA: Diagnosis present

## 2019-02-16 DIAGNOSIS — J1282 Pneumonia due to coronavirus disease 2019: Secondary | ICD-10-CM | POA: Diagnosis present

## 2019-02-16 MED ORDER — GENERIC EXTERNAL MEDICATION
Status: DC
Start: ? — End: 2019-02-16

## 2019-02-16 NOTE — Progress Notes (Addendum)
Pulmonary Critical Care Medicine [facility] [place]   PULMONARY CRITICAL CARE SERVICE  PROGRESS NOTE  Date of Service: 02/16/2019  Juan Cameron  POE:[id number]  DOB: Oct 03, 1975   DOA: 02/14/2019  Referring Physician: Carron Curie, MD  HPI: Juan Cameron is a 44 y.o. male seen for follow up of Acute on Chronic Respiratory Failure.  Patient right now is weaning on the NAG requiring 1 L has been tolerating it well  Medications: Reviewed on Rounds  Physical Exam:  Vitals: Temperature 98.9 pulse 101 respiratory 28 blood pressure is 154/97 saturations 100%  Ventilator Settings off the ventilator on the NAG  . General: Comfortable at this time . Eyes: Grossly normal lids, irises & conjunctiva . ENT: grossly tongue is normal . Neck: no obvious mass . Cardiovascular: S1 S2 normal no gallop . Respiratory: No rhonchi coarse breath sounds . Abdomen: soft . Skin: no rash seen on limited exam . Musculoskeletal: not rigid . Psychiatric:unable to assess . Neurologic: no seizure no involuntary movements         Lab Data:   Basic Metabolic Panel: Recent Labs  Lab 02/15/19 0536  NA 146*  K 4.2  CL 108  CO2 27  GLUCOSE 101*  BUN 11  CREATININE 1.01  CALCIUM 9.4    ABG: No results for input(s): PHART, PCO2ART, PO2ART, HCO3, O2SAT in the last 168 hours.  Liver Function Tests: No results for input(s): AST, ALT, ALKPHOS, BILITOT, PROT, ALBUMIN in the last 168 hours. No results for input(s): LIPASE, AMYLASE in the last 168 hours. No results for input(s): AMMONIA in the last 168 hours.  CBC: Recent Labs  Lab 02/15/19 0536  WBC 13.8*  HGB 9.6*  HCT 29.3*  MCV 82.5  PLT 283    Cardiac Enzymes: No results for input(s): CKTOTAL, CKMB, CKMBINDEX, TROPONINI in the last 168 hours.  BNP (last 3 results) No results for input(s): BNP in the last 8760 hours.  ProBNP (last 3 results) No results for input(s): PROBNP in the last 8760 hours.  Radiological Exams: DG  CHEST PORT 1 VIEW  Result Date: 02/15/2019 CLINICAL DATA:  Patient unresponsive.  Pneumonia. EXAM: PORTABLE CHEST 1 VIEW COMPARISON:  PA and lateral chest 06/24/2015. FINDINGS: NG tube courses into the stomach and below the inferior margin of the film. Tracheostomy tube projects in good position. Lung volumes are somewhat low with bibasilar atelectasis. No consolidative process, pneumothorax or effusion. Heart size is upper normal. IMPRESSION: No acute finding in a low volume chest. Tracheostomy tube projects in good position. NG tube courses into the stomach and below the inferior margin of the film. Electronically Signed   By: Drusilla Kanner M.D.   On: 02/15/2019 12:31   DG Abd Portable 1V  Result Date: 02/14/2019 CLINICAL DATA:  NG tube placement EXAM: PORTABLE ABDOMEN - 1 VIEW COMPARISON:  None. FINDINGS: Tip the NG tube is seen within the distal stomach. Mildly dilated air-filled loops of small bowel in the mid abdomen. No radio-opaque calculi or other significant radiographic abnormality are seen. IMPRESSION: Tip the NG tube within the distal stomach. Electronically Signed   By: Jonna Clark M.D.   On: 02/14/2019 22:41    Assessment/Plan Active Problems:   Acute on chronic respiratory failure with hypoxia (HCC)   COVID-19 virus infection   Pneumonia due to COVID-19 virus   Postinflammatory pulmonary fibrosis (HCC)   Autism   1. Acute on chronic respiratory failure hypoxia plan is to continue to wean on the NAG supplemental  1 L oxygen 2. COVID-19 virus infection in resolution phase 3. Pneumonia due to COVID-19 residual deficits are noted on the chest films 4. Postinflammatory process secondary to above COVID-19 pneumonia 5. Dizziness baseline   I have personally seen and evaluated the patient, evaluated laboratory and imaging results, formulated the assessment and plan and placed orders. The Patient requires high complexity decision making with multiple systems involvement.  Rounds  were done with the Respiratory Therapy Director and Staff therapists and discussed with nursing staff also.  Allyne Gee, MD [facility] Pulmonary Critical Care Medicine Sleep Medicine

## 2019-02-17 DIAGNOSIS — J8410 Pulmonary fibrosis, unspecified: Secondary | ICD-10-CM | POA: Diagnosis not present

## 2019-02-17 DIAGNOSIS — U071 COVID-19: Secondary | ICD-10-CM | POA: Diagnosis not present

## 2019-02-17 DIAGNOSIS — J9621 Acute and chronic respiratory failure with hypoxia: Secondary | ICD-10-CM | POA: Diagnosis not present

## 2019-02-17 DIAGNOSIS — F840 Autistic disorder: Secondary | ICD-10-CM | POA: Diagnosis not present

## 2019-02-17 NOTE — Progress Notes (Addendum)
Pulmonary Critical Care Medicine [facility] [place]   PULMONARY CRITICAL CARE SERVICE  PROGRESS NOTE  Date of Service: 02/17/2019  Juan Cameron  LNL:[id number]  DOB: 07-25-1975   DOA: 02/14/2019  Referring Physician: Carron Curie, MD  HPI: Juan Cameron is a 44 y.o. male seen for follow up of Acute on Chronic Respiratory Failure.  Patient remains on 2 L via NAG at this time satting well no distress.  Medications: Reviewed on Rounds  Physical Exam:  Vitals: Pulse 104 respirations 23 BP 163/85 O2 sat 100% temp 98.8  Ventilator Settings nag 2l  . General: Comfortable at this time . Eyes: Grossly normal lids, irises & conjunctiva . ENT: grossly tongue is normal . Neck: no obvious mass . Cardiovascular: S1 S2 normal no gallop . Respiratory: No rales or rhonchi noted . Abdomen: soft . Skin: no rash seen on limited exam . Musculoskeletal: not rigid . Psychiatric:unable to assess . Neurologic: no seizure no involuntary movements         Lab Data:   Basic Metabolic Panel: Recent Labs  Lab 02/15/19 0536  NA 146*  K 4.2  CL 108  CO2 27  GLUCOSE 101*  BUN 11  CREATININE 1.01  CALCIUM 9.4    ABG: No results for input(s): PHART, PCO2ART, PO2ART, HCO3, O2SAT in the last 168 hours.  Liver Function Tests: No results for input(s): AST, ALT, ALKPHOS, BILITOT, PROT, ALBUMIN in the last 168 hours. No results for input(s): LIPASE, AMYLASE in the last 168 hours. No results for input(s): AMMONIA in the last 168 hours.  CBC: Recent Labs  Lab 02/15/19 0536  WBC 13.8*  HGB 9.6*  HCT 29.3*  MCV 82.5  PLT 283    Cardiac Enzymes: No results for input(s): CKTOTAL, CKMB, CKMBINDEX, TROPONINI in the last 168 hours.  BNP (last 3 results) No results for input(s): BNP in the last 8760 hours.  ProBNP (last 3 results) No results for input(s): PROBNP in the last 8760 hours.  Radiological Exams: No results found.  Assessment/Plan Active Problems:   Acute on  chronic respiratory failure with hypoxia (HCC)   COVID-19 virus infection   Pneumonia due to COVID-19 virus   Postinflammatory pulmonary fibrosis (HCC)   Autism   1. Acute on chronic respiratory failure hypoxia plan is to continue to wean on the NAG supplemental 1 L oxygen 2. COVID-19 virus infection in resolution phase 3. Pneumonia due to COVID-19 residual deficits are noted on the chest films 4. Postinflammatory process secondary to above COVID-19 pneumonia 5. Dizziness baseline   I have personally seen and evaluated the patient, evaluated laboratory and imaging results, formulated the assessment and plan and placed orders. The Patient requires high complexity decision making with multiple systems involvement.  Rounds were done with the Respiratory Therapy Director and Staff therapists and discussed with nursing staff also.  [name], MD [facility] Pulmonary Critical Care Medicine Sleep Medicine

## 2019-02-18 ENCOUNTER — Other Ambulatory Visit (HOSPITAL_COMMUNITY): Payer: Medicare Other

## 2019-02-18 DIAGNOSIS — U071 COVID-19: Secondary | ICD-10-CM | POA: Diagnosis not present

## 2019-02-18 DIAGNOSIS — J9621 Acute and chronic respiratory failure with hypoxia: Secondary | ICD-10-CM | POA: Diagnosis not present

## 2019-02-18 DIAGNOSIS — J8410 Pulmonary fibrosis, unspecified: Secondary | ICD-10-CM | POA: Diagnosis not present

## 2019-02-18 DIAGNOSIS — F840 Autistic disorder: Secondary | ICD-10-CM | POA: Diagnosis not present

## 2019-02-18 LAB — COMPREHENSIVE METABOLIC PANEL
ALT: 90 U/L — ABNORMAL HIGH (ref 0–44)
AST: 35 U/L (ref 15–41)
Albumin: 2.8 g/dL — ABNORMAL LOW (ref 3.5–5.0)
Alkaline Phosphatase: 155 U/L — ABNORMAL HIGH (ref 38–126)
Anion gap: 13 (ref 5–15)
BUN: 12 mg/dL (ref 6–20)
CO2: 28 mmol/L (ref 22–32)
Calcium: 9.3 mg/dL (ref 8.9–10.3)
Chloride: 98 mmol/L (ref 98–111)
Creatinine, Ser: 0.9 mg/dL (ref 0.61–1.24)
GFR calc Af Amer: >60 mL/min (ref >60)
GFR calc non Af Amer: >60 mL/min (ref >60)
Glucose, Bld: 97 mg/dL (ref 70–99)
Potassium: 4 mmol/L (ref 3.5–5.1)
Sodium: 139 mmol/L (ref 135–145)
Total Bilirubin: 1.1 mg/dL (ref 0.3–1.2)
Total Protein: 7.2 g/dL (ref 6.5–8.1)

## 2019-02-18 NOTE — Progress Notes (Addendum)
Pulmonary Critical Care Medicine [facility] [place]   PULMONARY CRITICAL CARE SERVICE  PROGRESS NOTE  Date of Service: 02/18/2019  Juan Cameron  OZH:[id number]  DOB: 1975-06-30   DOA: 02/14/2019  Referring Physician: Carron Curie, MD  HPI: Juan Cameron is a 44 y.o. male seen for follow up of Acute on Chronic Respiratory Failure.  Patient remains on NAG 2 L satting well no distress.  Medications: Reviewed on Rounds  Physical Exam:  Vitals: Pulse 102 respirations 22 BP 145/80 O2 sat 1% temp 97.4  Ventilator Settings NAG 2 L  . General: Comfortable at this time . Eyes: Grossly normal lids, irises & conjunctiva . ENT: grossly tongue is normal . Neck: no obvious mass . Cardiovascular: S1 S2 normal no gallop . Respiratory: No rales or rhonchi noted . Abdomen: soft . Skin: no rash seen on limited exam . Musculoskeletal: not rigid . Psychiatric:unable to assess . Neurologic: no seizure no involuntary movements         Lab Data:   Basic Metabolic Panel: Recent Labs  Lab 02/15/19 0536 02/18/19 0659  NA 146* 139  K 4.2 4.0  CL 108 98  CO2 27 28  GLUCOSE 101* 97  BUN 11 12  CREATININE 1.01 0.90  CALCIUM 9.4 9.3    ABG: No results for input(s): PHART, PCO2ART, PO2ART, HCO3, O2SAT in the last 168 hours.  Liver Function Tests: Recent Labs  Lab 02/18/19 0659  AST 35  ALT 90*  ALKPHOS 155*  BILITOT 1.1  PROT 7.2  ALBUMIN 2.8*   No results for input(s): LIPASE, AMYLASE in the last 168 hours. No results for input(s): AMMONIA in the last 168 hours.  CBC: Recent Labs  Lab 02/15/19 0536  WBC 13.8*  HGB 9.6*  HCT 29.3*  MCV 82.5  PLT 283    Cardiac Enzymes: No results for input(s): CKTOTAL, CKMB, CKMBINDEX, TROPONINI in the last 168 hours.  BNP (last 3 results) No results for input(s): BNP in the last 8760 hours.  ProBNP (last 3 results) No results for input(s): PROBNP in the last 8760 hours.  Radiological Exams: DG Abd Portable  1V  Result Date: 02/18/2019 CLINICAL DATA:  NG tube placement. EXAM: PORTABLE ABDOMEN - 1 VIEW COMPARISON:  None. FINDINGS: An NG tube is identified with tip overlying the distal stomach. Bowel gas pattern is unchanged. IMPRESSION: NG tube with tip overlying the distal stomach. Electronically Signed   By: Harmon Pier M.D.   On: 02/18/2019 06:40    Assessment/Plan Active Problems:   Acute on chronic respiratory failure with hypoxia (HCC)   COVID-19 virus infection   Pneumonia due to COVID-19 virus   Postinflammatory pulmonary fibrosis (HCC)   Autism   1. Acute on chronic respiratory failure hypoxia plan is to continue to wean on the NAG supplemental 2 L oxygen 2. COVID-19 virus infection in resolution phase 3. Pneumonia due to COVID-19 residual deficits are noted on the chest films 4. Postinflammatory process secondary to above COVID-19 pneumonia 5. Dizziness baseline   I have personally seen and evaluated the patient, evaluated laboratory and imaging results, formulated the assessment and plan and placed orders. The Patient requires high complexity decision making with multiple systems involvement.  Rounds were done with the Respiratory Therapy Director and Staff therapists and discussed with nursing staff also.  [name], MD [facility] Pulmonary Critical Care Medicine Sleep Medicine

## 2019-02-19 DIAGNOSIS — F840 Autistic disorder: Secondary | ICD-10-CM | POA: Diagnosis not present

## 2019-02-19 DIAGNOSIS — U071 COVID-19: Secondary | ICD-10-CM | POA: Diagnosis not present

## 2019-02-19 DIAGNOSIS — J8410 Pulmonary fibrosis, unspecified: Secondary | ICD-10-CM | POA: Diagnosis not present

## 2019-02-19 DIAGNOSIS — J9621 Acute and chronic respiratory failure with hypoxia: Secondary | ICD-10-CM | POA: Diagnosis not present

## 2019-02-19 NOTE — Progress Notes (Signed)
Pulmonary Critical Care Medicine [facility] [place]   PULMONARY CRITICAL CARE SERVICE  PROGRESS NOTE  Date of Service: 02/19/2019  Juan Cameron  QMV:[id number]  DOB: 03-09-1975   DOA: 02/14/2019  Referring Physician: Carron Curie, MD  HPI: Juan Cameron is a 44 y.o. male seen for follow up of Acute on Chronic Respiratory Failure.  Patient is on the nag device currently on 1 L oxygen good saturations  Medications: Reviewed on Rounds  Physical Exam:  Vitals: Temperature is 97.6 pulse 112 respiratory rate 20 blood pressure is 155/90 saturations 100%  Ventilator Settings on the nag 1 L oxygen  . General: Comfortable at this time . Eyes: Grossly normal lids, irises & conjunctiva . ENT: grossly tongue is normal . Neck: no obvious mass . Cardiovascular: S1 S2 normal no gallop . Respiratory: Scattered rhonchi expansion is equal . Abdomen: soft . Skin: no rash seen on limited exam . Musculoskeletal: not rigid . Psychiatric:unable to assess . Neurologic: no seizure no involuntary movements         Lab Data:   Basic Metabolic Panel: Recent Labs  Lab 02/15/19 0536 02/18/19 0659  NA 146* 139  K 4.2 4.0  CL 108 98  CO2 27 28  GLUCOSE 101* 97  BUN 11 12  CREATININE 1.01 0.90  CALCIUM 9.4 9.3    ABG: No results for input(s): PHART, PCO2ART, PO2ART, HCO3, O2SAT in the last 168 hours.  Liver Function Tests: Recent Labs  Lab 02/18/19 0659  AST 35  ALT 90*  ALKPHOS 155*  BILITOT 1.1  PROT 7.2  ALBUMIN 2.8*   No results for input(s): LIPASE, AMYLASE in the last 168 hours. No results for input(s): AMMONIA in the last 168 hours.  CBC: Recent Labs  Lab 02/15/19 0536  WBC 13.8*  HGB 9.6*  HCT 29.3*  MCV 82.5  PLT 283    Cardiac Enzymes: No results for input(s): CKTOTAL, CKMB, CKMBINDEX, TROPONINI in the last 168 hours.  BNP (last 3 results) No results for input(s): BNP in the last 8760 hours.  ProBNP (last 3 results) No results for input(s):  PROBNP in the last 8760 hours.  Radiological Exams: DG Abd Portable 1V  Result Date: 02/18/2019 CLINICAL DATA:  NG tube placement. EXAM: PORTABLE ABDOMEN - 1 VIEW COMPARISON:  None. FINDINGS: An NG tube is identified with tip overlying the distal stomach. Bowel gas pattern is unchanged. IMPRESSION: NG tube with tip overlying the distal stomach. Electronically Signed   By: Harmon Pier M.D.   On: 02/18/2019 06:40    Assessment/Plan Active Problems:   Acute on chronic respiratory failure with hypoxia (HCC)   COVID-19 virus infection   Pneumonia due to COVID-19 virus   Postinflammatory pulmonary fibrosis (HCC)   Autism   1. Acute on chronic respiratory failure with hypoxia plan is to continue with the weaning right now is only requiring 1 L supplemental oxygen 2. COVID-19 virus infection treated clinically improved 3. Pneumonia due to COVID-19 treated 4. Postinflammatory pulmonary fibrosis no change we will continue with supportive care 5. Autism at baseline   I have personally seen and evaluated the patient, evaluated laboratory and imaging results, formulated the assessment and plan and placed orders. The Patient requires high complexity decision making with multiple systems involvement.  Rounds were done with the Respiratory Therapy Director and Staff therapists and discussed with nursing staff also.  [name], MD [facility] Pulmonary Critical Care Medicine Sleep Medicine

## 2019-02-20 DIAGNOSIS — F840 Autistic disorder: Secondary | ICD-10-CM | POA: Diagnosis not present

## 2019-02-20 DIAGNOSIS — J9621 Acute and chronic respiratory failure with hypoxia: Secondary | ICD-10-CM | POA: Diagnosis not present

## 2019-02-20 DIAGNOSIS — U071 COVID-19: Secondary | ICD-10-CM | POA: Diagnosis not present

## 2019-02-20 DIAGNOSIS — J8410 Pulmonary fibrosis, unspecified: Secondary | ICD-10-CM | POA: Diagnosis not present

## 2019-02-20 NOTE — Progress Notes (Signed)
Pulmonary Critical Care Medicine [facility] [place]   PULMONARY CRITICAL CARE SERVICE  PROGRESS NOTE  Date of Service: 02/20/2019  Juan Cameron  ELF:[id number]  DOB: 1975/06/06   DOA: 02/14/2019  Referring Physician: Carron Curie, MD  HPI: Juan Cameron is a 44 y.o. male seen for follow up of Acute on Chronic Respiratory Failure.  Patient is currently on the nag and is using 1 L oxygen  Medications: Reviewed on Rounds  Physical Exam:  Vitals: Temperature 98.7 pulse 106 respiratory rate 26 blood pressure is 132/75 saturations 96%  Ventilator Settings on the NAG 1 L  . General: Comfortable at this time . Eyes: Grossly normal lids, irises & conjunctiva . ENT: grossly tongue is normal . Neck: no obvious mass . Cardiovascular: S1 S2 normal no gallop . Respiratory: No rhonchi no rales . Abdomen: soft . Skin: no rash seen on limited exam . Musculoskeletal: not rigid . Psychiatric:unable to assess . Neurologic: no seizure no involuntary movements         Lab Data:   Basic Metabolic Panel: Recent Labs  Lab 02/15/19 0536 02/18/19 0659  NA 146* 139  K 4.2 4.0  CL 108 98  CO2 27 28  GLUCOSE 101* 97  BUN 11 12  CREATININE 1.01 0.90  CALCIUM 9.4 9.3    ABG: No results for input(s): PHART, PCO2ART, PO2ART, HCO3, O2SAT in the last 168 hours.  Liver Function Tests: Recent Labs  Lab 02/18/19 0659  AST 35  ALT 90*  ALKPHOS 155*  BILITOT 1.1  PROT 7.2  ALBUMIN 2.8*   No results for input(s): LIPASE, AMYLASE in the last 168 hours. No results for input(s): AMMONIA in the last 168 hours.  CBC: Recent Labs  Lab 02/15/19 0536  WBC 13.8*  HGB 9.6*  HCT 29.3*  MCV 82.5  PLT 283    Cardiac Enzymes: No results for input(s): CKTOTAL, CKMB, CKMBINDEX, TROPONINI in the last 168 hours.  BNP (last 3 results) No results for input(s): BNP in the last 8760 hours.  ProBNP (last 3 results) No results for input(s): PROBNP in the last 8760  hours.  Radiological Exams: No results found.  Assessment/Plan Active Problems:   Acute on chronic respiratory failure with hypoxia (HCC)   COVID-19 virus infection   Pneumonia due to COVID-19 virus   Postinflammatory pulmonary fibrosis (HCC)   Autism   1. Acute on chronic respiratory failure with hypoxia plan is to continue with the NAG continue secretion management pulmonary toilet 2. COVID-19 virus infection treated resolved 3. Pneumonia due to COVID-19 treated 4. Postinflammatory fibrosis from COVID-19 continue with supportive care will need follow-up after discharge 5. Autism no change   I have personally seen and evaluated the patient, evaluated laboratory and imaging results, formulated the assessment and plan and placed orders. The Patient requires high complexity decision making with multiple systems involvement.  Rounds were done with the Respiratory Therapy Director and Staff therapists and discussed with nursing staff also.  [name], MD [facility] Pulmonary Critical Care Medicine Sleep Medicine

## 2019-02-21 DIAGNOSIS — F840 Autistic disorder: Secondary | ICD-10-CM | POA: Diagnosis not present

## 2019-02-21 DIAGNOSIS — U071 COVID-19: Secondary | ICD-10-CM | POA: Diagnosis not present

## 2019-02-21 DIAGNOSIS — J8410 Pulmonary fibrosis, unspecified: Secondary | ICD-10-CM | POA: Diagnosis not present

## 2019-02-21 DIAGNOSIS — J9621 Acute and chronic respiratory failure with hypoxia: Secondary | ICD-10-CM | POA: Diagnosis not present

## 2019-02-21 NOTE — Progress Notes (Addendum)
Pulmonary Critical Care Medicine [facility] [place]   PULMONARY CRITICAL CARE SERVICE  PROGRESS NOTE  Date of Service: 02/21/2019  Juan Cameron  [id number]  DOB: 1976-01-04   DOA: 02/14/2019  Referring Physician: Carron Curie, MD  HPI: Juan Cameron is a 44 y.o. male seen for follow up of Acute on Chronic Respiratory Failure.  Patient remains on NAG 1 L satting well no distress.  Medications: Reviewed on Rounds  Physical Exam:  Vitals: Pulse 97 respirations 18 BP 123/68 O2 sat 97% temp 97.6  Ventilator Settings NAG 1 L  . General: Comfortable at this time . Eyes: Grossly normal lids, irises & conjunctiva . ENT: grossly tongue is normal . Neck: no obvious mass . Cardiovascular: S1 S2 normal no gallop . Respiratory: No rales or rhonchi noted . Abdomen: soft . Skin: no rash seen on limited exam . Musculoskeletal: not rigid . Psychiatric:unable to assess . Neurologic: no seizure no involuntary movements         Lab Data:   Basic Metabolic Panel: Recent Labs  Lab 02/15/19 0536 02/18/19 0659  NA 146* 139  K 4.2 4.0  CL 108 98  CO2 27 28  GLUCOSE 101* 97  BUN 11 12  CREATININE 1.01 0.90  CALCIUM 9.4 9.3    ABG: No results for input(s): PHART, PCO2ART, PO2ART, HCO3, O2SAT in the last 168 hours.  Liver Function Tests: Recent Labs  Lab 02/18/19 0659  AST 35  ALT 90*  ALKPHOS 155*  BILITOT 1.1  PROT 7.2  ALBUMIN 2.8*   No results for input(s): LIPASE, AMYLASE in the last 168 hours. No results for input(s): AMMONIA in the last 168 hours.  CBC: Recent Labs  Lab 02/15/19 0536  WBC 13.8*  HGB 9.6*  HCT 29.3*  MCV 82.5  PLT 283    Cardiac Enzymes: No results for input(s): CKTOTAL, CKMB, CKMBINDEX, TROPONINI in the last 168 hours.  BNP (last 3 results) No results for input(s): BNP in the last 8760 hours.  ProBNP (last 3 results) No results for input(s): PROBNP in the last 8760 hours.  Radiological Exams: No results  found.  Assessment/Plan Active Problems:   Acute on chronic respiratory failure with hypoxia (HCC)   COVID-19 virus infection   Pneumonia due to COVID-19 virus   Postinflammatory pulmonary fibrosis (HCC)   Autism   1. Acute on chronic respiratory failure with hypoxia plan is to continue with the NAG continue secretion management pulmonary toilet 2. COVID-19 virus infection treated resolved 3. Pneumonia due to COVID-19 treated 4. Postinflammatory fibrosis from COVID-19 continue with supportive care will need follow-up after discharge 5. Autism no change   I have personally seen and evaluated the patient, evaluated laboratory and imaging results, formulated the assessment and plan and placed orders. The Patient requires high complexity decision making with multiple systems involvement.  Rounds were done with the Respiratory Therapy Director and Staff therapists and discussed with nursing staff also.  [name], MD [facility] Pulmonary Critical Care Medicine Sleep Medicine

## 2019-02-22 DIAGNOSIS — J9621 Acute and chronic respiratory failure with hypoxia: Secondary | ICD-10-CM | POA: Diagnosis not present

## 2019-02-22 DIAGNOSIS — F840 Autistic disorder: Secondary | ICD-10-CM | POA: Diagnosis not present

## 2019-02-22 DIAGNOSIS — U071 COVID-19: Secondary | ICD-10-CM | POA: Diagnosis not present

## 2019-02-22 DIAGNOSIS — J8410 Pulmonary fibrosis, unspecified: Secondary | ICD-10-CM | POA: Diagnosis not present

## 2019-02-22 NOTE — Progress Notes (Signed)
Pulmonary Critical Care Medicine [facility] [place]   PULMONARY CRITICAL CARE SERVICE  PROGRESS NOTE  Date of Service: 02/22/2019  Juan Cameron  [id number]  DOB: 03/31/1975   DOA: 02/14/2019  Referring Physician: Carron Curie, MD  HPI: Juan Cameron is a 44 y.o. male seen for follow up of Acute on Chronic Respiratory Failure.  Patient is off the ventilator on the NAG right now seems to be doing well secretions are moderate  Medications: Reviewed on Rounds  Physical Exam:  Vitals: Temperature is 97.7 pulse 111 respiratory rate 24 blood pressure is 152/96 saturations 100%  Ventilator Settings off the ventilator on the NAG  . General: Comfortable at this time . Eyes: Grossly normal lids, irises & conjunctiva . ENT: grossly tongue is normal . Neck: no obvious mass . Cardiovascular: S1 S2 normal no gallop . Respiratory: Coarse rhonchi expansion is equal . Abdomen: soft . Skin: no rash seen on limited exam . Musculoskeletal: not rigid . Psychiatric:unable to assess . Neurologic: no seizure no involuntary movements         Lab Data:   Basic Metabolic Panel: Recent Labs  Lab 02/18/19 0659  NA 139  K 4.0  CL 98  CO2 28  GLUCOSE 97  BUN 12  CREATININE 0.90  CALCIUM 9.3    ABG: No results for input(s): PHART, PCO2ART, PO2ART, HCO3, O2SAT in the last 168 hours.  Liver Function Tests: Recent Labs  Lab 02/18/19 0659  AST 35  ALT 90*  ALKPHOS 155*  BILITOT 1.1  PROT 7.2  ALBUMIN 2.8*   No results for input(s): LIPASE, AMYLASE in the last 168 hours. No results for input(s): AMMONIA in the last 168 hours.  CBC: No results for input(s): WBC, NEUTROABS, HGB, HCT, MCV, PLT in the last 168 hours.  Cardiac Enzymes: No results for input(s): CKTOTAL, CKMB, CKMBINDEX, TROPONINI in the last 168 hours.  BNP (last 3 results) No results for input(s): BNP in the last 8760 hours.  ProBNP (last 3 results) No results for input(s): PROBNP in the last 8760  hours.  Radiological Exams: No results found.  Assessment/Plan Active Problems:   Acute on chronic respiratory failure with hypoxia (HCC)   COVID-19 virus infection   Pneumonia due to COVID-19 virus   Postinflammatory pulmonary fibrosis (HCC)   Autism   1. Acute on chronic respiratory failure hypoxia plan is to continue with NAG right now is requiring only 1 L oxygen continue with supportive care 2. COVID-19 virus infection treated now in resolution phase 3. Pneumonia due to COVID-19 slow improvement 4. Postinflammatory pulmonary fibrosis secondary to COVID-19 will continue with present management 5. Autism no change   I have personally seen and evaluated the patient, evaluated laboratory and imaging results, formulated the assessment and plan and placed orders. The Patient requires high complexity decision making with multiple systems involvement.  Rounds were done with the Respiratory Therapy Director and Staff therapists and discussed with nursing staff also.  [name], MD [facility] Pulmonary Critical Care Medicine Sleep Medicine

## 2019-02-23 DIAGNOSIS — F840 Autistic disorder: Secondary | ICD-10-CM | POA: Diagnosis not present

## 2019-02-23 DIAGNOSIS — U071 COVID-19: Secondary | ICD-10-CM | POA: Diagnosis not present

## 2019-02-23 DIAGNOSIS — J9621 Acute and chronic respiratory failure with hypoxia: Secondary | ICD-10-CM | POA: Diagnosis not present

## 2019-02-23 DIAGNOSIS — J8410 Pulmonary fibrosis, unspecified: Secondary | ICD-10-CM | POA: Diagnosis not present

## 2019-02-23 LAB — COMPREHENSIVE METABOLIC PANEL
ALT: 42 U/L (ref 0–44)
AST: 25 U/L (ref 15–41)
Albumin: 3.3 g/dL — ABNORMAL LOW (ref 3.5–5.0)
Alkaline Phosphatase: 127 U/L — ABNORMAL HIGH (ref 38–126)
Anion gap: 14 (ref 5–15)
BUN: 16 mg/dL (ref 6–20)
CO2: 27 mmol/L (ref 22–32)
Calcium: 10 mg/dL (ref 8.9–10.3)
Chloride: 95 mmol/L — ABNORMAL LOW (ref 98–111)
Creatinine, Ser: 1.02 mg/dL (ref 0.61–1.24)
GFR calc Af Amer: >60 mL/min (ref >60)
GFR calc non Af Amer: >60 mL/min (ref >60)
Glucose, Bld: 99 mg/dL (ref 70–99)
Potassium: 4.3 mmol/L (ref 3.5–5.1)
Sodium: 136 mmol/L (ref 135–145)
Total Bilirubin: 1.1 mg/dL (ref 0.3–1.2)
Total Protein: 8.3 g/dL — ABNORMAL HIGH (ref 6.5–8.1)

## 2019-02-23 LAB — CBC
HCT: 32.2 % — ABNORMAL LOW (ref 39.0–52.0)
Hemoglobin: 10.3 g/dL — ABNORMAL LOW (ref 13.0–17.0)
MCH: 27.5 pg (ref 26.0–34.0)
MCHC: 32 g/dL (ref 30.0–36.0)
MCV: 85.9 fL (ref 80.0–100.0)
Platelets: 328 10*3/uL (ref 150–400)
RBC: 3.75 MIL/uL — ABNORMAL LOW (ref 4.22–5.81)
RDW: 18.4 % — ABNORMAL HIGH (ref 11.5–15.5)
WBC: 12.4 10*3/uL — ABNORMAL HIGH (ref 4.0–10.5)
nRBC: 0 % (ref 0.0–0.2)

## 2019-02-23 NOTE — Progress Notes (Addendum)
Pulmonary Critical Care Medicine [facility] [place]   PULMONARY CRITICAL CARE SERVICE  PROGRESS NOTE  Date of Service: 02/23/2019  Juan Cameron  [id number]  DOB: [birth year]   DOA: 02/14/2019  Referring Physician: Carron Curie, MD  HPI: Juan Cameron is a 44 y.o. male seen for follow up of Acute on Chronic Respiratory Failure.  Patient remains on 1 L NAG satting well with no distress.  Medications: Reviewed on Rounds  Physical Exam:  Vitals: Pulse 108 respirations 22 BP 142/63 O2 sat 98% temp 97.4  Ventilator Settings 1 L NAG  . General: Comfortable at this time . Eyes: Grossly normal lids, irises & conjunctiva . ENT: grossly tongue is normal . Neck: no obvious mass . Cardiovascular: S1 S2 normal no gallop . Respiratory: No rales or rhonchi noted . Abdomen: soft . Skin: no rash seen on limited exam . Musculoskeletal: not rigid . Psychiatric:unable to assess . Neurologic: no seizure no involuntary movements         Lab Data:   Basic Metabolic Panel: Recent Labs  Lab 02/18/19 0659  NA 139  K 4.0  CL 98  CO2 28  GLUCOSE 97  BUN 12  CREATININE 0.90  CALCIUM 9.3    ABG: No results for input(s): PHART, PCO2ART, PO2ART, HCO3, O2SAT in the last 168 hours.  Liver Function Tests: Recent Labs  Lab 02/18/19 0659  AST 35  ALT 90*  ALKPHOS 155*  BILITOT 1.1  PROT 7.2  ALBUMIN 2.8*   No results for input(s): LIPASE, AMYLASE in the last 168 hours. No results for input(s): AMMONIA in the last 168 hours.  CBC: No results for input(s): WBC, NEUTROABS, HGB, HCT, MCV, PLT in the last 168 hours.  Cardiac Enzymes: No results for input(s): CKTOTAL, CKMB, CKMBINDEX, TROPONINI in the last 168 hours.  BNP (last 3 results) No results for input(s): BNP in the last 8760 hours.  ProBNP (last 3 results) No results for input(s): PROBNP in the last 8760 hours.  Radiological Exams: No results found.  Assessment/Plan Active Problems:   Acute on chronic  respiratory failure with hypoxia (HCC)   COVID-19 virus infection   Pneumonia due to COVID-19 virus   Postinflammatory pulmonary fibrosis (HCC)   Autism   1. Acute on chronic respiratory failure hypoxia plan is to continue with NAG right now is requiring only 1 L oxygen continue with supportive care 2. COVID-19 virus infection treated now in resolution phase 3. Pneumonia due to COVID-19 slow improvement 4. Postinflammatory pulmonary fibrosis secondary to COVID-19 will continue with present management 5. Autism no change   I have personally seen and evaluated the patient, evaluated laboratory and imaging results, formulated the assessment and plan and placed orders. The Patient requires high complexity decision making with multiple systems involvement.  Rounds were done with the Respiratory Therapy Director and Staff therapists and discussed with nursing staff also.  [name], MD [facility] Pulmonary Critical Care Medicine Sleep Medicine

## 2019-02-24 ENCOUNTER — Other Ambulatory Visit (HOSPITAL_COMMUNITY): Payer: Medicare Other

## 2019-02-24 DIAGNOSIS — J8410 Pulmonary fibrosis, unspecified: Secondary | ICD-10-CM | POA: Diagnosis not present

## 2019-02-24 DIAGNOSIS — F840 Autistic disorder: Secondary | ICD-10-CM | POA: Diagnosis not present

## 2019-02-24 DIAGNOSIS — U071 COVID-19: Secondary | ICD-10-CM | POA: Diagnosis not present

## 2019-02-24 DIAGNOSIS — J9621 Acute and chronic respiratory failure with hypoxia: Secondary | ICD-10-CM | POA: Diagnosis not present

## 2019-02-24 NOTE — Progress Notes (Addendum)
Pulmonary Critical Care Medicine [facility] [place]   PULMONARY CRITICAL CARE SERVICE  PROGRESS NOTE  Date of Service: 02/24/2019  Juan Cameron  [id number]:[id number]  DOB: [birth year]   DOA: 02/14/2019  Referring Physician: Carron Curie, MD  HPI: Juan Cameron is a 44 y.o. male seen for follow up of Acute on Chronic Respiratory Failure.  Patient remains on NAG 2 L at this time satting well no fever distress.  Medications: Reviewed on Rounds  Physical Exam:  Vitals: Pulse 103 respirations 20 BP 132/70 O2 sat 99% temp 98.6  Ventilator Settings NAG 2 L  . General: Comfortable at this time . Eyes: Grossly normal lids, irises & conjunctiva . ENT: grossly tongue is normal . Neck: no obvious mass . Cardiovascular: S1 S2 normal no gallop . Respiratory: No rales or rhonchi noted . Abdomen: soft . Skin: no rash seen on limited exam . Musculoskeletal: not rigid . Psychiatric:unable to assess . Neurologic: no seizure no involuntary movements         Lab Data:   Basic Metabolic Panel: Recent Labs  Lab 02/18/19 0659 02/23/19 1931  NA 139 136  K 4.0 4.3  CL 98 95*  CO2 28 27  GLUCOSE 97 99  BUN 12 16  CREATININE 0.90 1.02  CALCIUM 9.3 10.0    ABG: No results for input(s): PHART, PCO2ART, PO2ART, HCO3, O2SAT in the last 168 hours.  Liver Function Tests: Recent Labs  Lab 02/18/19 0659 02/23/19 1931  AST 35 25  ALT 90* 42  ALKPHOS 155* 127*  BILITOT 1.1 1.1  PROT 7.2 8.3*  ALBUMIN 2.8* 3.3*   No results for input(s): LIPASE, AMYLASE in the last 168 hours. No results for input(s): AMMONIA in the last 168 hours.  CBC: Recent Labs  Lab 02/23/19 1931  WBC 12.4*  HGB 10.3*  HCT 32.2*  MCV 85.9  PLT 328    Cardiac Enzymes: No results for input(s): CKTOTAL, CKMB, CKMBINDEX, TROPONINI in the last 168 hours.  BNP (last 3 results) No results for input(s): BNP in the last 8760 hours.  ProBNP (last 3 results) No results for input(s): PROBNP in the last  8760 hours.  Radiological Exams: DG Abd 1 View  Result Date: 02/24/2019 CLINICAL DATA:  Check NG placement EXAM: ABDOMEN - 1 VIEW COMPARISON:  02/18/2019 FINDINGS: Scattered large and small bowel gas is noted. Gastric catheter is noted coiled within the stomach. No obstructive changes are seen. No bony abnormality is noted. IMPRESSION: Gastric catheter within the stomach. Electronically Signed   By: Alcide Clever M.D.   On: 02/24/2019 13:18    Assessment/Plan Active Problems:   Acute on chronic respiratory failure with hypoxia (HCC)   COVID-19 virus infection   Pneumonia due to COVID-19 virus   Postinflammatory pulmonary fibrosis (HCC)   Autism   1. Acute on chronic respiratory failure hypoxia plan is to continue with NAG right now is requiring only 2 L oxygen continue with supportive care 2. COVID-19 virus infection treated now in resolution phase 3. Pneumonia due to COVID-19 slow improvement 4. Postinflammatory pulmonary fibrosis secondary to COVID-19 will continue with present management 5. Autism no change   I have personally seen and evaluated the patient, evaluated laboratory and imaging results, formulated the assessment and plan and placed orders. The Patient requires high complexity decision making with multiple systems involvement.  Rounds were done with the Respiratory Therapy Director and Staff therapists and discussed with nursing staff also.  [name], MD [facility] Pulmonary  Critical Care Medicine Sleep Medicine

## 2019-02-25 DIAGNOSIS — F840 Autistic disorder: Secondary | ICD-10-CM | POA: Diagnosis not present

## 2019-02-25 DIAGNOSIS — J8410 Pulmonary fibrosis, unspecified: Secondary | ICD-10-CM | POA: Diagnosis not present

## 2019-02-25 DIAGNOSIS — U071 COVID-19: Secondary | ICD-10-CM | POA: Diagnosis not present

## 2019-02-25 DIAGNOSIS — J9621 Acute and chronic respiratory failure with hypoxia: Secondary | ICD-10-CM | POA: Diagnosis not present

## 2019-02-25 NOTE — Progress Notes (Addendum)
Pulmonary Critical Care Medicine [facility] [place]   PULMONARY CRITICAL CARE SERVICE  PROGRESS NOTE  Date of Service: 02/25/2019  Juan Cameron  [id number]  DOB: 1975-05-21   DOA: 02/14/2019  Referring Physician: Carron Curie, MD  HPI: Juan Cameron is a 44 y.o. male seen for follow up of Acute on Chronic Respiratory Failure.  Patient's trach changed to a #6 cuffless and he is capped on room air satting well no fever distress.  Arrangements are being made for home CPAP to be checked and used.  Medications: Reviewed on Rounds  Physical Exam:  Vitals: Pulse 78 respirations 18 BP 113/65 O2 sat 99% temp 97.7  Ventilator Settings room air  . General: Comfortable at this time . Eyes: Grossly normal lids, irises & conjunctiva . ENT: grossly tongue is normal . Neck: no obvious mass . Cardiovascular: S1 S2 normal no gallop . Respiratory: No rales or rhonchi noted . Abdomen: soft . Skin: no rash seen on limited exam . Musculoskeletal: not rigid . Psychiatric:unable to assess . Neurologic: no seizure no involuntary movements         Lab Data:   Basic Metabolic Panel: Recent Labs  Lab 02/23/19 1931  NA 136  K 4.3  CL 95*  CO2 27  GLUCOSE 99  BUN 16  CREATININE 1.02  CALCIUM 10.0    ABG: No results for input(s): PHART, PCO2ART, PO2ART, HCO3, O2SAT in the last 168 hours.  Liver Function Tests: Recent Labs  Lab 02/23/19 1931  AST 25  ALT 42  ALKPHOS 127*  BILITOT 1.1  PROT 8.3*  ALBUMIN 3.3*   No results for input(s): LIPASE, AMYLASE in the last 168 hours. No results for input(s): AMMONIA in the last 168 hours.  CBC: Recent Labs  Lab 02/23/19 1931  WBC 12.4*  HGB 10.3*  HCT 32.2*  MCV 85.9  PLT 328    Cardiac Enzymes: No results for input(s): CKTOTAL, CKMB, CKMBINDEX, TROPONINI in the last 168 hours.  BNP (last 3 results) No results for input(s): BNP in the last 8760 hours.  ProBNP (last 3 results) No results for input(s): PROBNP  in the last 8760 hours.  Radiological Exams: DG Abd 1 View  Result Date: 02/24/2019 CLINICAL DATA:  Check NG tube placement. EXAM: ABDOMEN - 1 VIEW COMPARISON:  Abdominal radiograph 02/24/2019 FINDINGS: The nasogastric tube side port projects over the distal stomach/pylorus. Nonobstructive bowel gas pattern. No supine evidence for free air. IMPRESSION: Nasogastric tube side port projects over the distal stomach/pylorus. Electronically Signed   By: Emmaline Kluver M.D.   On: 02/24/2019 18:19   DG Abd 1 View  Result Date: 02/24/2019 CLINICAL DATA:  Check NG placement EXAM: ABDOMEN - 1 VIEW COMPARISON:  02/18/2019 FINDINGS: Scattered large and small bowel gas is noted. Gastric catheter is noted coiled within the stomach. No obstructive changes are seen. No bony abnormality is noted. IMPRESSION: Gastric catheter within the stomach. Electronically Signed   By: Alcide Clever M.D.   On: 02/24/2019 13:18    Assessment/Plan Active Problems:   Acute on chronic respiratory failure with hypoxia (HCC)   COVID-19 virus infection   Pneumonia due to COVID-19 virus   Postinflammatory pulmonary fibrosis (HCC)   Autism   1. Acute on chronic respiratory failure hypoxia patient is capped on room air satting well continue supportive care 2. COVID-19 virus infection treated now in resolution phase 3. Pneumonia due to COVID-19 slow improvement 4. Postinflammatory pulmonary fibrosis secondary to COVID-19 will continue with present  management 5. Autism no change   I have personally seen and evaluated the patient, evaluated laboratory and imaging results, formulated the assessment and plan and placed orders. The Patient requires high complexity decision making with multiple systems involvement.  Rounds were done with the Respiratory Therapy Director and Staff therapists and discussed with nursing staff also.  Allyne Gee, MD [facility] Pulmonary Critical Care Medicine Sleep Medicine

## 2019-02-26 ENCOUNTER — Other Ambulatory Visit (HOSPITAL_COMMUNITY): Payer: Medicare Other

## 2019-02-26 DIAGNOSIS — J9621 Acute and chronic respiratory failure with hypoxia: Secondary | ICD-10-CM | POA: Diagnosis not present

## 2019-02-26 DIAGNOSIS — U071 COVID-19: Secondary | ICD-10-CM | POA: Diagnosis not present

## 2019-02-26 DIAGNOSIS — F840 Autistic disorder: Secondary | ICD-10-CM | POA: Diagnosis not present

## 2019-02-26 DIAGNOSIS — J8410 Pulmonary fibrosis, unspecified: Secondary | ICD-10-CM | POA: Diagnosis not present

## 2019-02-26 MED ORDER — GENERIC EXTERNAL MEDICATION
Status: DC
Start: ? — End: 2019-02-26

## 2019-02-26 NOTE — Progress Notes (Signed)
Pulmonary Critical Care Medicine [facility] [place]   PULMONARY CRITICAL CARE SERVICE  PROGRESS NOTE  Date of Service: 02/26/2019  Juan Cameron  [id number]:[id number]  DOB: 13-Aug-1975   DOA: 02/14/2019  Referring Physician: Carron Curie, MD  HPI: Juan Cameron is a 44 y.o. male seen for follow up of Acute on Chronic Respiratory Failure.  Patient at this time is capping has been on 1 L of oxygen completing 24 hours today.  In addition patient is supposed to have a swallowing study done today.  Medications: Reviewed on Rounds  Physical Exam:  Vitals: Temperature 98.6 pulse 86 respiratory rate 18 blood pressure is 120/81 saturations 97%  Ventilator Settings patient right now is on capping off the ventilator  . General: Comfortable at this time . Eyes: Grossly normal lids, irises & conjunctiva . ENT: grossly tongue is normal . Neck: no obvious mass . Cardiovascular: S1 S2 normal no gallop . Respiratory: No rhonchi no rales . At this time . Abdomen: soft . Skin: no rash seen on limited exam . Musculoskeletal: not rigid . Psychiatric:unable to assess . Neurologic: no seizure no involuntary movements         Lab Data:   Basic Metabolic Panel: Recent Labs  Lab 02/23/19 1931  NA 136  K 4.3  CL 95*  CO2 27  GLUCOSE 99  BUN 16  CREATININE 1.02  CALCIUM 10.0    ABG: No results for input(s): PHART, PCO2ART, PO2ART, HCO3, O2SAT in the last 168 hours.  Liver Function Tests: Recent Labs  Lab 02/23/19 1931  AST 25  ALT 42  ALKPHOS 127*  BILITOT 1.1  PROT 8.3*  ALBUMIN 3.3*   No results for input(s): LIPASE, AMYLASE in the last 168 hours. No results for input(s): AMMONIA in the last 168 hours.  CBC: Recent Labs  Lab 02/23/19 1931  WBC 12.4*  HGB 10.3*  HCT 32.2*  MCV 85.9  PLT 328    Cardiac Enzymes: No results for input(s): CKTOTAL, CKMB, CKMBINDEX, TROPONINI in the last 168 hours.  BNP (last 3 results) No results for input(s): BNP in the last  8760 hours.  ProBNP (last 3 results) No results for input(s): PROBNP in the last 8760 hours.  Radiological Exams: DG Abd 1 View  Result Date: 02/24/2019 CLINICAL DATA:  Check NG tube placement. EXAM: ABDOMEN - 1 VIEW COMPARISON:  Abdominal radiograph 02/24/2019 FINDINGS: The nasogastric tube side port projects over the distal stomach/pylorus. Nonobstructive bowel gas pattern. No supine evidence for free air. IMPRESSION: Nasogastric tube side port projects over the distal stomach/pylorus. Electronically Signed   By: Emmaline Kluver M.D.   On: 02/24/2019 18:19    Assessment/Plan Active Problems:   Acute on chronic respiratory failure with hypoxia (HCC)   COVID-19 virus infection   Pneumonia due to COVID-19 virus   Postinflammatory pulmonary fibrosis (HCC)   Autism   1. Acute on chronic respiratory failure with hypoxia plan is to continue with capping trials continue secretion management supportive care.  Doing well with 1 L of oxygen with a goal of 24 hours 2. With 19 virus infection treated we will continue with supportive care 3. Pneumonia due to COVID-19 recovering slowly follow x-rays 4. Postinflammatory fibrosis residual damage secondary to COVID-19 5. Autism no change   I have personally seen and evaluated the patient, evaluated laboratory and imaging results, formulated the assessment and plan and placed orders. The Patient requires high complexity decision making with multiple systems involvement.  Rounds were done with  the Respiratory Therapy Director and Staff therapists and discussed with nursing staff also.  Allyne Gee, MD [facility] Pulmonary Critical Care Medicine Sleep Medicine

## 2019-02-27 DIAGNOSIS — F840 Autistic disorder: Secondary | ICD-10-CM | POA: Diagnosis not present

## 2019-02-27 DIAGNOSIS — J9621 Acute and chronic respiratory failure with hypoxia: Secondary | ICD-10-CM | POA: Diagnosis not present

## 2019-02-27 DIAGNOSIS — J8410 Pulmonary fibrosis, unspecified: Secondary | ICD-10-CM | POA: Diagnosis not present

## 2019-02-27 DIAGNOSIS — U071 COVID-19: Secondary | ICD-10-CM | POA: Diagnosis not present

## 2019-02-27 NOTE — Progress Notes (Signed)
Pulmonary Critical Care Medicine [facility] [place]   PULMONARY CRITICAL CARE SERVICE  PROGRESS NOTE  Date of Service: 02/27/2019  Juan Cameron  EHU:[id number]  DOB: 21-Dec-1975   DOA: 02/14/2019  Referring Physician: Carron Curie, MD  HPI: Juan Cameron is a 44 y.o. male seen for follow up of Acute on Chronic Respiratory Failure.  Patient currently is capping 48 hours is the goal  Medications: Reviewed on Rounds  Physical Exam:  Vitals: Temperature 98.2 pulse 99 respiratory rate 24 blood pressure is 132/79 saturations 100%  Ventilator Settings capping on 48-hour goal  . General: Comfortable at this time . Eyes: Grossly normal lids, irises & conjunctiva . ENT: grossly tongue is normal . Neck: no obvious mass . Cardiovascular: S1 S2 normal no gallop . Respiratory: Coarse rhonchi expansion is equal . Abdomen: soft . Skin: no rash seen on limited exam . Musculoskeletal: not rigid . Psychiatric:unable to assess . Neurologic: no seizure no involuntary movements         Lab Data:   Basic Metabolic Panel: Recent Labs  Lab 02/23/19 1931  NA 136  K 4.3  CL 95*  CO2 27  GLUCOSE 99  BUN 16  CREATININE 1.02  CALCIUM 10.0    ABG: No results for input(s): PHART, PCO2ART, PO2ART, HCO3, O2SAT in the last 168 hours.  Liver Function Tests: Recent Labs  Lab 02/23/19 1931  AST 25  ALT 42  ALKPHOS 127*  BILITOT 1.1  PROT 8.3*  ALBUMIN 3.3*   No results for input(s): LIPASE, AMYLASE in the last 168 hours. No results for input(s): AMMONIA in the last 168 hours.  CBC: Recent Labs  Lab 02/23/19 1931  WBC 12.4*  HGB 10.3*  HCT 32.2*  MCV 85.9  PLT 328    Cardiac Enzymes: No results for input(s): CKTOTAL, CKMB, CKMBINDEX, TROPONINI in the last 168 hours.  BNP (last 3 results) No results for input(s): BNP in the last 8760 hours.  ProBNP (last 3 results) No results for input(s): PROBNP in the last 8760 hours.  Radiological Exams: No results  found.  Assessment/Plan Active Problems:   Acute on chronic respiratory failure with hypoxia (HCC)   COVID-19 virus infection   Pneumonia due to COVID-19 virus   Postinflammatory pulmonary fibrosis (HCC)   Autism   1. Acute on chronic respiratory failure with hypoxia plan is to continue with capping trials 48-hour goal 2. COVID-19 virus infection now in resolution phase 3. Pneumonia due to COVID-19 treated 4. Postinflammatory pulmonary fibrosis no change 5. Oxygen at baseline   I have personally seen and evaluated the patient, evaluated laboratory and imaging results, formulated the assessment and plan and placed orders. The Patient requires high complexity decision making with multiple systems involvement.  Rounds were done with the Respiratory Therapy Director and Staff therapists and discussed with nursing staff also.  [name], MD [facility] Pulmonary Critical Care Medicine Sleep Medicine

## 2019-02-28 DIAGNOSIS — J8410 Pulmonary fibrosis, unspecified: Secondary | ICD-10-CM | POA: Diagnosis not present

## 2019-02-28 DIAGNOSIS — F840 Autistic disorder: Secondary | ICD-10-CM | POA: Diagnosis not present

## 2019-02-28 DIAGNOSIS — J9621 Acute and chronic respiratory failure with hypoxia: Secondary | ICD-10-CM | POA: Diagnosis not present

## 2019-02-28 DIAGNOSIS — U071 COVID-19: Secondary | ICD-10-CM | POA: Diagnosis not present

## 2019-02-28 NOTE — Progress Notes (Signed)
Pulmonary Critical Care Medicine [facility] [place]   PULMONARY CRITICAL CARE SERVICE  PROGRESS NOTE  Date of Service: 02/28/2019  Juan Cameron  KWI:[id number]  DOB: [birth year]   DOA: 02/14/2019  Referring Physician: Carron Curie, MD  HPI: Juan Cameron is a 44 y.o. male seen for follow up of Acute on Chronic Respiratory Failure.  Possibly looking at discharge patient has been capping now for 72 hours he should be ready for decannulation but he may actually go to inpatient rehab for further management and could be decannulated there also.  We are going to check with the rehab department and see what their plan is  Medications: Reviewed on Rounds  Physical Exam:  Vitals: Temperature is 98.0 pulse 80 respiratory rate 20 blood pressure is 123/70 saturations 98%  Ventilator Settings capping 72 hours  . General: Comfortable at this time . Eyes: Grossly normal lids, irises & conjunctiva . ENT: grossly tongue is normal . Neck: no obvious mass . Cardiovascular: S1 S2 normal no gallop . Respiratory: No rhonchi no rales are noted . Abdomen: soft . Skin: no rash seen on limited exam . Musculoskeletal: not rigid . Psychiatric:unable to assess . Neurologic: no seizure no involuntary movements         Lab Data:   Basic Metabolic Panel: Recent Labs  Lab 02/23/19 1931  NA 136  K 4.3  CL 95*  CO2 27  GLUCOSE 99  BUN 16  CREATININE 1.02  CALCIUM 10.0    ABG: No results for input(s): PHART, PCO2ART, PO2ART, HCO3, O2SAT in the last 168 hours.  Liver Function Tests: Recent Labs  Lab 02/23/19 1931  AST 25  ALT 42  ALKPHOS 127*  BILITOT 1.1  PROT 8.3*  ALBUMIN 3.3*   No results for input(s): LIPASE, AMYLASE in the last 168 hours. No results for input(s): AMMONIA in the last 168 hours.  CBC: Recent Labs  Lab 02/23/19 1931  WBC 12.4*  HGB 10.3*  HCT 32.2*  MCV 85.9  PLT 328    Cardiac Enzymes: No results for input(s): CKTOTAL, CKMB, CKMBINDEX,  TROPONINI in the last 168 hours.  BNP (last 3 results) No results for input(s): BNP in the last 8760 hours.  ProBNP (last 3 results) No results for input(s): PROBNP in the last 8760 hours.  Radiological Exams: No results found.  Assessment/Plan Active Problems:   Acute on chronic respiratory failure with hypoxia (HCC)   COVID-19 virus infection   Pneumonia due to COVID-19 virus   Postinflammatory pulmonary fibrosis (HCC)   Autism   1. Acute on chronic respiratory failure with hypoxia patient is doing well with the capping ready for decannulation 2. COVID-19 virus infection treated resolved 3. Pneumonia due to COVID-19 this has resolved patient is doing much better 4. Postinflammatory pulmonary fibrosis no change we will continue with supportive care   I have personally seen and evaluated the patient, evaluated laboratory and imaging results, formulated the assessment and plan and placed orders. The Patient requires high complexity decision making with multiple systems involvement.  Rounds were done with the Respiratory Therapy Director and Staff therapists and discussed with nursing staff also.  [name], MD [facility] Pulmonary Critical Care Medicine Sleep Medicine

## 2019-03-01 ENCOUNTER — Inpatient Hospital Stay (HOSPITAL_COMMUNITY)
Admission: RE | Admit: 2019-03-01 | Discharge: 2019-03-09 | DRG: 945 | Disposition: A | Discharge status: Home Health | Payer: Medicare Other | Source: Other Acute Inpatient Hospital | Attending: Physical Medicine & Rehabilitation | Admitting: Physical Medicine & Rehabilitation

## 2019-03-01 DIAGNOSIS — Z5329 Procedure and treatment not carried out because of patient's decision for other reasons: Secondary | ICD-10-CM | POA: Diagnosis present

## 2019-03-01 DIAGNOSIS — J9610 Chronic respiratory failure, unspecified whether with hypoxia or hypercapnia: Secondary | ICD-10-CM | POA: Diagnosis present

## 2019-03-01 DIAGNOSIS — Z79899 Other long term (current) drug therapy: Secondary | ICD-10-CM

## 2019-03-01 DIAGNOSIS — G4733 Obstructive sleep apnea (adult) (pediatric): Secondary | ICD-10-CM | POA: Diagnosis present

## 2019-03-01 DIAGNOSIS — K5900 Constipation, unspecified: Secondary | ICD-10-CM | POA: Diagnosis present

## 2019-03-01 DIAGNOSIS — Z9981 Dependence on supplemental oxygen: Secondary | ICD-10-CM

## 2019-03-01 DIAGNOSIS — R059 Cough, unspecified: Secondary | ICD-10-CM

## 2019-03-01 DIAGNOSIS — G479 Sleep disorder, unspecified: Secondary | ICD-10-CM | POA: Diagnosis present

## 2019-03-01 DIAGNOSIS — Z8249 Family history of ischemic heart disease and other diseases of the circulatory system: Secondary | ICD-10-CM

## 2019-03-01 DIAGNOSIS — J15212 Pneumonia due to Methicillin resistant Staphylococcus aureus: Secondary | ICD-10-CM

## 2019-03-01 DIAGNOSIS — J8410 Pulmonary fibrosis, unspecified: Secondary | ICD-10-CM | POA: Diagnosis present

## 2019-03-01 DIAGNOSIS — R5381 Other malaise: Secondary | ICD-10-CM | POA: Diagnosis present

## 2019-03-01 DIAGNOSIS — F909 Attention-deficit hyperactivity disorder, unspecified type: Secondary | ICD-10-CM | POA: Diagnosis present

## 2019-03-01 DIAGNOSIS — F840 Autistic disorder: Secondary | ICD-10-CM | POA: Diagnosis present

## 2019-03-01 DIAGNOSIS — Z8614 Personal history of Methicillin resistant Staphylococcus aureus infection: Secondary | ICD-10-CM

## 2019-03-01 DIAGNOSIS — D638 Anemia in other chronic diseases classified elsewhere: Secondary | ICD-10-CM

## 2019-03-01 DIAGNOSIS — I10 Essential (primary) hypertension: Secondary | ICD-10-CM

## 2019-03-01 DIAGNOSIS — U071 COVID-19: Secondary | ICD-10-CM | POA: Diagnosis not present

## 2019-03-01 DIAGNOSIS — D72829 Elevated white blood cell count, unspecified: Secondary | ICD-10-CM | POA: Diagnosis present

## 2019-03-01 DIAGNOSIS — Z930 Tracheostomy status: Secondary | ICD-10-CM | POA: Diagnosis not present

## 2019-03-01 DIAGNOSIS — Z7952 Long term (current) use of systemic steroids: Secondary | ICD-10-CM

## 2019-03-01 DIAGNOSIS — J9382 Other air leak: Secondary | ICD-10-CM | POA: Diagnosis not present

## 2019-03-01 DIAGNOSIS — Z430 Encounter for attention to tracheostomy: Secondary | ICD-10-CM

## 2019-03-01 DIAGNOSIS — J9621 Acute and chronic respiratory failure with hypoxia: Secondary | ICD-10-CM | POA: Diagnosis not present

## 2019-03-01 DIAGNOSIS — R000 Tachycardia, unspecified: Secondary | ICD-10-CM | POA: Diagnosis not present

## 2019-03-01 DIAGNOSIS — D573 Sickle-cell trait: Secondary | ICD-10-CM | POA: Diagnosis present

## 2019-03-01 DIAGNOSIS — Z8616 Personal history of COVID-19: Secondary | ICD-10-CM | POA: Diagnosis not present

## 2019-03-01 DIAGNOSIS — R05 Cough: Secondary | ICD-10-CM

## 2019-03-01 DIAGNOSIS — Z91010 Allergy to peanuts: Secondary | ICD-10-CM | POA: Diagnosis not present

## 2019-03-01 DIAGNOSIS — E785 Hyperlipidemia, unspecified: Secondary | ICD-10-CM | POA: Diagnosis present

## 2019-03-01 DIAGNOSIS — M7989 Other specified soft tissue disorders: Secondary | ICD-10-CM | POA: Diagnosis not present

## 2019-03-01 LAB — NOVEL CORONAVIRUS, NAA (HOSP ORDER, SEND-OUT TO REF LAB; TAT 18-24 HRS): SARS-CoV-2, NAA: NOT DETECTED

## 2019-03-01 LAB — CBC
HCT: 36.7 % — ABNORMAL LOW (ref 39.0–52.0)
Hemoglobin: 11.9 g/dL — ABNORMAL LOW (ref 13.0–17.0)
MCH: 27.5 pg (ref 26.0–34.0)
MCHC: 32.4 g/dL (ref 30.0–36.0)
MCV: 84.8 fL (ref 80.0–100.0)
Platelets: 376 10*3/uL (ref 150–400)
RBC: 4.33 MIL/uL (ref 4.22–5.81)
RDW: 18.1 % — ABNORMAL HIGH (ref 11.5–15.5)
WBC: 11.8 10*3/uL — ABNORMAL HIGH (ref 4.0–10.5)
nRBC: 0 % (ref 0.0–0.2)

## 2019-03-01 LAB — CREATININE, SERUM
Creatinine, Ser: 1.08 mg/dL (ref 0.61–1.24)
GFR calc Af Amer: >60 mL/min (ref >60)
GFR calc non Af Amer: >60 mL/min (ref >60)

## 2019-03-01 MED ORDER — ASCORBIC ACID 500 MG PO TABS
500.0000 mg | ORAL_TABLET | Freq: Two times a day (BID) | ORAL | Status: DC
  Administered 2019-03-01 – 2019-03-09 (×15): 500 mg via ORAL
  Filled 2019-03-01 (×15): qty 1

## 2019-03-01 MED ORDER — POLYETHYLENE GLYCOL 3350 17 G PO PACK
17.0000 g | PACK | Freq: Every day | ORAL | Status: DC
  Administered 2019-03-01 – 2019-03-08 (×6): 17 g via ORAL
  Filled 2019-03-01 (×9): qty 1

## 2019-03-01 MED ORDER — HEPARIN SODIUM (PORCINE) 5000 UNIT/ML IJ SOLN
5000.0000 [IU] | Freq: Three times a day (TID) | INTRAMUSCULAR | Status: DC
  Administered 2019-03-01 – 2019-03-09 (×23): 5000 [IU] via SUBCUTANEOUS
  Filled 2019-03-01 (×23): qty 1

## 2019-03-01 MED ORDER — FUROSEMIDE 20 MG PO TABS
20.0000 mg | ORAL_TABLET | Freq: Every day | ORAL | Status: DC
  Administered 2019-03-01 – 2019-03-09 (×9): 20 mg via ORAL
  Filled 2019-03-01 (×9): qty 1

## 2019-03-01 MED ORDER — FERROUS SULFATE 325 (65 FE) MG PO TABS
325.0000 mg | ORAL_TABLET | Freq: Every day | ORAL | Status: DC
  Administered 2019-03-02 – 2019-03-09 (×8): 325 mg via ORAL
  Filled 2019-03-01 (×8): qty 1

## 2019-03-01 MED ORDER — SORBITOL 70 % SOLN: 30.0000 mL | Freq: Every day | Status: DC | PRN

## 2019-03-01 MED ORDER — FOLIC ACID 1 MG PO TABS
1.0000 mg | ORAL_TABLET | Freq: Every day | ORAL | Status: DC
  Administered 2019-03-01 – 2019-03-09 (×9): 1 mg via ORAL
  Filled 2019-03-01 (×9): qty 1

## 2019-03-01 MED ORDER — CITALOPRAM HYDROBROMIDE 10 MG PO TABS
10.0000 mg | ORAL_TABLET | Freq: Every day | ORAL | Status: DC
  Administered 2019-03-01 – 2019-03-09 (×9): 10 mg via ORAL
  Filled 2019-03-01 (×9): qty 1

## 2019-03-01 MED ORDER — FAMOTIDINE 20 MG PO TABS
20.0000 mg | ORAL_TABLET | Freq: Two times a day (BID) | ORAL | Status: DC
  Administered 2019-03-01 – 2019-03-09 (×16): 20 mg via ORAL
  Filled 2019-03-01 (×16): qty 1

## 2019-03-01 MED ORDER — ZINC SULFATE 220 (50 ZN) MG PO CAPS
220.0000 mg | ORAL_CAPSULE | Freq: Every day | ORAL | Status: DC
  Administered 2019-03-01 – 2019-03-09 (×9): 220 mg via ORAL
  Filled 2019-03-01 (×9): qty 1

## 2019-03-01 MED ORDER — VITAMIN D 25 MCG (1000 UNIT) PO TABS
1000.0000 [IU] | ORAL_TABLET | Freq: Every day | ORAL | Status: DC
  Administered 2019-03-01 – 2019-03-09 (×9): 1000 [IU] via ORAL
  Filled 2019-03-01 (×9): qty 1

## 2019-03-01 MED ORDER — CLONAZEPAM 0.5 MG PO TABS
0.5000 mg | ORAL_TABLET | Freq: Every day | ORAL | Status: DC
  Administered 2019-03-01 – 2019-03-08 (×8): 0.5 mg via ORAL
  Filled 2019-03-01 (×8): qty 1

## 2019-03-01 MED ORDER — HEPARIN SODIUM (PORCINE) 5000 UNIT/ML IJ SOLN: 5000.0000 [IU] | Freq: Three times a day (TID) | INTRAMUSCULAR | Status: DC

## 2019-03-01 MED ORDER — SENNOSIDES-DOCUSATE SODIUM 8.6-50 MG PO TABS
1.0000 | ORAL_TABLET | Freq: Every day | ORAL | Status: DC
  Administered 2019-03-01 – 2019-03-08 (×8): 1 via ORAL
  Filled 2019-03-01 (×8): qty 1

## 2019-03-01 NOTE — Plan of Care (Signed)
  Problem: Consults Goal: RH GENERAL PATIENT EDUCATION Description: See Patient Education module for education specifics. Outcome: Progressing Goal: Skin Care Protocol Initiated - if Braden Score 18 or less Description: If consults are not indicated, leave blank or document N/A Outcome: Progressing   Problem: RH BOWEL ELIMINATION Goal: RH STG MANAGE BOWEL WITH ASSISTANCE Description: STG Manage Bowel with min Assistance. Outcome: Progressing   Problem: RH SKIN INTEGRITY Goal: RH STG SKIN FREE OF INFECTION/BREAKDOWN Description: Pt will be free of skin breakdown and infection prior to DC with min asssit Outcome: Progressing   Problem: RH SAFETY Goal: RH STG ADHERE TO SAFETY PRECAUTIONS W/ASSISTANCE/DEVICE Description: STG Adhere to Safety Precautions With cues/reminders Assistance/Device. Outcome: Progressing   Problem: RH PAIN MANAGEMENT Goal: RH STG PAIN MANAGED AT OR BELOW PT'S PAIN GOAL Description: Less than 3  Outcome: Progressing   

## 2019-03-01 NOTE — Progress Notes (Signed)
Pulmonary Critical Care Medicine [facility] [place]   PULMONARY CRITICAL CARE SERVICE  PROGRESS NOTE  Date of Service: 03/01/2019  Juan Cameron  [id number]:[id number]  DOB: [birth year]   DOA: 02/14/2019  Referring Physician: Carron Curie, MD  HPI: Juan Cameron is a 44 y.o. male seen for follow up of Acute on Chronic Respiratory Failure.  Patient is doing well with capping trials as been accepted for discharge to rehab and they will continue with the weaning of the tracheostomy and eventual decannulation in rehab  Medications: Reviewed on Rounds  Physical Exam:  Vitals: Temperature 98.3 pulse 108 respiratory rate 20 blood pressure is 120/80 saturations 99%  Ventilator Settings capping right now off the ventilator  . General: Comfortable at this time . Eyes: Grossly normal lids, irises & conjunctiva . ENT: grossly tongue is normal . Neck: no obvious mass . Cardiovascular: S1 S2 normal no gallop . Respiratory: No rhonchi no rales are noted at this time . Abdomen: soft . Skin: no rash seen on limited exam . Musculoskeletal: not rigid . Psychiatric:unable to assess . Neurologic: no seizure no involuntary movements         Lab Data:   Basic Metabolic Panel: Recent Labs  Lab 02/23/19 1931  NA 136  K 4.3  CL 95*  CO2 27  GLUCOSE 99  BUN 16  CREATININE 1.02  CALCIUM 10.0    ABG: No results for input(s): PHART, PCO2ART, PO2ART, HCO3, O2SAT in the last 168 hours.  Liver Function Tests: Recent Labs  Lab 02/23/19 1931  AST 25  ALT 42  ALKPHOS 127*  BILITOT 1.1  PROT 8.3*  ALBUMIN 3.3*   No results for input(s): LIPASE, AMYLASE in the last 168 hours. No results for input(s): AMMONIA in the last 168 hours.  CBC: Recent Labs  Lab 02/23/19 1931  WBC 12.4*  HGB 10.3*  HCT 32.2*  MCV 85.9  PLT 328    Cardiac Enzymes: No results for input(s): CKTOTAL, CKMB, CKMBINDEX, TROPONINI in the last 168 hours.  BNP (last 3 results) No results for input(s):  BNP in the last 8760 hours.  ProBNP (last 3 results) No results for input(s): PROBNP in the last 8760 hours.  Radiological Exams: No results found.  Assessment/Plan Active Problems:   Acute on chronic respiratory failure with hypoxia (HCC)   COVID-19 virus infection   Pneumonia due to COVID-19 virus   Postinflammatory pulmonary fibrosis (HCC)   Autism   1. Acute on chronic respiratory failure with hypoxia plan is to continue with weaning while the patient is in rehab we will continue with other supportive care 2. COVID-19 virus infection resolved 3. Pneumonia due to COVID-19 treated resolved 4. Postinflammatory fibrosis no change 5. Autism at baseline   I have personally seen and evaluated the patient, evaluated laboratory and imaging results, formulated the assessment and plan and placed orders. The Patient requires high complexity decision making with multiple systems involvement.  Rounds were done with the Respiratory Therapy Director and Staff therapists and discussed with nursing staff also.  [name], MD [facility] Pulmonary Critical Care Medicine Sleep Medicine

## 2019-03-01 NOTE — Progress Notes (Signed)
Patient arrived to unit via wheelchair. Patient Is A/O x3. Patient was oriented to unit. Patient was accompanied by sister who is his legal guardian. Plan of care and medications were reviewed. No questions or concerns at this time. Leane Para, LPN

## 2019-03-01 NOTE — Progress Notes (Addendum)
PMR Admission Coordinator Pre-Admission Assessment   Patient: Juan Cameron is an 44 y.o., male MRN: [id number] DOB: [birth year] Height: 5\' 9"  (1.753 m) Weight: 95.7 kg   Insurance Information HMO:    PPO:      PCP:      IPA:      80/20:      OTHER:  PRIMARY: Medicare A and B      Policy#:      Subscriber: patient CM Name:       Phone#:      Fax#:  Pre-Cert#: verified eligibility online      Employer:  Benefits:  Phone #:      Name:  Eff. Date: A and B 04/11/97     Deduct: $1484      Out of Pocket Max:       Life Max:  CIR: 100%      SNF: 20 full days Outpatient: 80%     Co-Pay: 20% Home Health: 100%      Co-Pay:  DME: 80%     Co-Pay: 20% Providers: pt choice   SECONDARY: Medicaid      Policy#: 06/11/97 L      Subscriber:  CM Name:       Phone#:      Fax#:  Pre-Cert#:       Employer:  Benefits:  Phone #:      Name:  Eff. Date:      Deduct:       Out of Pocket Max:       Life Max:  CIR:       SNF:  Outpatient:      Co-Pay:  Home Health:       Co-Pay:  DME:      Co-Pay:    Medicaid Application Date:       Case Manager:  Disability Application Date:       Case Worker:    The "Data Collection Information Summary" for patients in Inpatient Rehabilitation Facilities with attached "Privacy Act Statement-Health Care Records" was provided and verbally reviewed with: Patient   Emergency Contact Information         Contact Information     Name Relation Home Work Mobile    Scharfenberg,[name] Sister     [phone]    [phone]     [phone]    [phone] Relative     [phone]         Current Medical History  Patient Admitting Diagnosis: debility, post Covid    History of Present Illness: Juan Cameron is a 44 year old right-handed African-American male with history significant for autism with element of attention deficit hyperactivity disorder as well as pulmonary fibrosis with 2 L oxygen at home, hypertension, hyperlipidemia, chronic anemia and sickle cell  trait.  Required supervision prior to admission for safety and attends adult daycare.   Patient well-known to rehab services from admission 06/10/2015 to 06/24/2015 for debilitation secondary to chronic respiratory failure.  Presented to [facility] January 07, 2019 with increasing shortness of breath, found to have COVID-19 pneumonia, as well as multifocal bacterial pneumonia.  Patient was treated with dexamethasone and tracheal aspirate positive for MRSA completing 14-day course of vancomycin.  Patient's hospital course complicated by development of sepsis with septic shock ARDS.  Hospital course ventilatory support with tracheostomy performed, complicated by some tracheal stoma bleeding requiring embolization. [facility]  He currently has a #6 cuffless Trach and has  been capped for greater than 72 hours and documentation states ready for decannulation.  Maintained on subcutaneous heparin for DVT prophylaxis.  Initially with nasogastric tube for nutritional support diet has been advanced to regular diet with thin liquids.  Therapy evaluations were completed and pt was recommended for a comprehensive rehab program.    Patient's medical record from [facility] has been reviewed by the rehabilitation admission coordinator and physician.   Past Medical History      Past Medical History:  Diagnosis Date  . Acute on chronic respiratory failure with hypoxia (HCC)    . Autism    . Autism    . COVID-19 virus infection    . Pneumonia 02/2015  . Pneumonia due to COVID-19 virus    . Postinflammatory pulmonary fibrosis (HCC)    . Sickle cell trait (HCC)        Family History   family history includes Heart attack in his mother.   Prior Rehab/Hospitalizations Has the patient had prior rehab or hospitalizations prior to admission? Yes   Has the patient had major surgery during 100 days prior to admission? Yes               Current Medications No current facility-administered medications for this encounter.   Patients Current Diet:     Diet Order                      Diet regular Room service appropriate? Yes with Assist; Fluid consistency: Thin  Diet effective now                   Precautions / Restrictions      Has the patient had 2 or more falls or a fall with injury in the past year? No   Prior Activity Level  supervision for all mobility and ADLs.  Attends adult daycare program.    Prior Functional Level Self Care: Did the patient need help bathing, dressing, using the toilet or eating? Needed some help   Indoor Mobility: Did the patient need assistance with walking from room to room (with or without device)? Needed some help   Stairs: Did the patient need assistance with internal or external stairs (with or without device)? Needed some help   Functional Cognition: Did the patient need help planning regular tasks such as shopping or remembering to take medications? Dependent   Home Assistive Devices / Equipment   Prior Device Use: Indicate devices/aids used by the patient prior to current illness, exacerbation or injury? None of the above     Prior Functional Level Current Functional Level  Bed Mobility    supervision Min assist    Transfers   supervision Mod assist    Mobility - Walk/Wheelchair   supervision Mod assist    Upper Body Dressing   supervision Mod assist    Lower Body Dressing   Supervision  Mod assist    Grooming   Supervision  Mod assist    Eating/Drinking    supervision    Min assist    Toilet Transfer   supervision Mod assist    Bladder Continence    continent   continent    Bowel Management   continent   continent    Stair Climbing   supervision    Communication   supervision   min assist    Memory   supervision min assist    Driving    n/a  Special needs/care consideration BiPAP/CPAP CPAP at night CPM no Continuous  Drip IV no Dialysis no        Days n/a Life Vest no Oxygen 2L at baseline Special Bed no Trach Size shiley 6 cuffless, capped  Wound Vac (area) no      Location n/a Skin healed MDAPI to peri-trach area                  Bowel mgmt: continent Bladder mgmt: continent Diabetic mgmt: no Behavioral consideration yes, autism Chemo/radiation no    Previous Home Environment (from acute therapy documentation)   Lives with sister, [name], and attends adult daycare.   One level home with 2-3 steps to enter (no rails).  Tub/shower unit with curtain, standard toilet.  Bathroom accessible via walker.    Discharge Living Setting   Will discharge home with his sister, [name].  She and other family members will provide 24/7 supervision and pt will plan to return to adult daycare when able.  One level home with 2-3 steps to enter (no rails).  Tub/shower, standard toilet.  Bathroom accessible via walker.   Social/Family/Support Systems   his sister, [name], is his legal guardian.  She can be reached at [phone].  She and her family have arranged 24/7 supervision for pt at baseline.  He attends adult daycare.  Discharge plan has been discussed with [name] and she has no questions.  No issues with lodging/transportation.    Goals/Additional Needs  Goals: PT/OT/SLP supervision ELOS 7-10 days Special Service needs: pt is autistic, prefers male caregivers Pt/family agree to admission.  Program orientation provided and reviewed with pt/caregiver, including roles/responsibility.    Decrease burden of Care through IP rehab admission: n/a   Possible need for SNF placement upon discharge: not anticipated    Patient Condition: I have reviewed medical records from [facility], spoken with CM, and patient and family member. I discussed via phone for inpatient rehabilitation assessment.  Patient will benefit from ongoing PT, OT and SLP, can actively participate in 3 hours of therapy a  day 5 days of the week, and can make measurable gains during the admission.  Patient will also benefit from the coordinated team approach during an Inpatient Acute Rehabilitation admission.  The patient will receive intensive therapy as well as Rehabilitation physician, nursing, social worker, and care management interventions.  Due to safety, skin/wound care, disease management, medication administration, pain management and patient education the patient requires 24 hour a day rehabilitation nursing.  The patient is currently min to mod assist with mobility and basic ADLs.  Discharge setting and therapy post discharge at home  is anticipated.  Patient has agreed to participate in the Acute Inpatient Rehabilitation Program and will admit today.   Preadmission Screen Completed By:  Michel Santee, PT, DPT 03/01/2019 9:42 AM ______________________________________________________________________   Discussed status with Dr. Dagoberto Ligas on 03/01/19  at 10:09 AM  and received approval for admission today.   Admission Coordinator:  Michel Santee, PT, DPT time 10:09 AM Sudie Grumbling 03/01/19     Assessment/Plan: Diagnosis: 1. Does the need for close, 24 hr/day Medical supervision in concert with the patient's rehab needs make it unreasonable for this patient to be served in a less intensive setting? Yes 2. Co-Morbidities requiring supervision/potential complications: Autism, ADHD, pulmonary fibrosis, 2L O2 at home; anemia and sickle cell trait; COVID pneumonia 3. Due to bladder management, bowel management, safety, skin/wound care, disease management, medication administration, pain management and patient education, does the  patient require 24 hr/day rehab nursing? Yes 4. Does the patient require coordinated care of a physician, rehab nurse, PT, OT, and SLP to address physical and functional deficits in the context of the above medical diagnosis(es)? Yes Addressing deficits in the following areas: balance, endurance,  locomotion, strength, transferring, bowel/bladder control, bathing, dressing, feeding, grooming, toileting and psychosocial support 5. Can the patient actively participate in an intensive therapy program of at least 3 hrs of therapy 5 days a week? Yes 6. The potential for patient to make measurable gains while on inpatient rehab is good 7. Anticipated functional outcomes upon discharge from inpatient rehab: supervision PT, supervision OT, supervision SLP 8. Estimated rehab length of stay to reach the above functional goals is: 14-18 days 9. Anticipated discharge destination: Home 10. Overall Rehab/Functional Prognosis: good   MD Signature

## 2019-03-01 NOTE — H&P (Signed)
Physical Medicine and Rehabilitation Admission H&P    : HPI: Juan Cameron is a 44 year old right-handed African-American male with history significant for autism with element of attention deficit hyperactivity disorder as well as pulmonary fibrosis with 2 L oxygen at home, hypertension, hyperlipidemia, chronic anemia and sickle cell trait who lives with his sister in a 1 level Apt. 2 steps to entry.  Required supervision prior to admission for safety and attends adult daycare   Patient well-known to rehab services from admission 06/10/2015 to 06/24/2015 for debilitation secondary to chronic respiratory failure.  [name] January 07, 2019 with increasing shortness of breath found to have COVID-19 pneumonia as well as multifocal bacterial pneumonia.  Patient was treated with dexamethasone and tracheal aspirate positive for MRSA completing 14-day course of vancomycin.  Patient's hospital course complicated by development of sepsis with septic shock ARDS.  Hospital course ventilatory support with tracheostomy performed complicated by some tracheal stoma bleeding requiring embolization..  [facility]  He currently has a #6 cuffless Trach and has been capped for greater than 72 hours and documentation states ready for decannulation.  Maintained on subcutaneous heparin for DVT prophylaxis.  Initially with nasogastric tube for nutritional support diet has been advanced to regular.  Patient was admitted for a comprehensive rehab program.  Pt reports doesn't feel constipated, but doesn't remember when LBM- says it was lately.  Denies pain and reports breathing is good.   Review of Systems  Constitutional: Negative for chills and fever.  HENT: Negative for hearing loss.   Eyes: Negative for blurred vision and double vision.  Respiratory: Positive for cough, sputum production and shortness of breath.   Cardiovascular:  Positive for leg swelling. Negative for chest pain and palpitations.  Gastrointestinal: Positive for constipation. Negative for heartburn, nausea and vomiting.  Genitourinary: Negative for dysuria, flank pain and hematuria.  Musculoskeletal: Positive for joint pain and myalgias.  Skin: Negative for rash.  Neurological: Negative.   Endo/Heme/Allergies: Negative.   Psychiatric/Behavioral: Negative.   All other systems reviewed and are negative.  Past Medical History:  Diagnosis Date  . Acute on chronic respiratory failure with hypoxia ([place])   . Autism   . Autism   . COVID-19 virus infection   . Pneumonia 02/2015  . Pneumonia due to COVID-19 virus   . Postinflammatory pulmonary fibrosis ([place])   . Sickle cell trait [facility])    Past Surgical History:  Procedure Laterality Date  . NO PAST SURGERIES     Family History  Problem Relation Age of Onset  . Heart attack Mother    Social History:  reports that he has never smoked. He has never used smokeless tobacco. He reports that he does not drink alcohol or use drugs. Allergies:  Allergies  Allergen Reactions  . Peanuts [Peanut Oil] Anaphylaxis   Medications Prior to Admission  Medication Sig Dispense Refill  . clonazePAM (KLONOPIN) 0.5 MG tablet Take 1 tablet (0.5 mg total) by mouth at bedtime. 30 tablet 0  . ferrous sulfate 325 (65 FE) MG tablet Take 1 tablet (325 mg total) by mouth 2 (two) times daily with a meal. 60 tablet 3  . OLANZapine zydis (ZYPREXA) 5 MG disintegrating tablet Take 1 tablet (5 mg total) by mouth 2 (two) times daily. 60 tablet 1  . pantoprazole (PROTONIX) 40 MG tablet TAKE 1 TABLET BY MOUTH EVERY DAY 30 tablet 2  . predniSONE (DELTASONE) 5 MG tablet 5 mg  tablet daily 3 days then one half tablet daily 3 days. 10 tablet 0  . vitamin C (VITAMIN C) 250 MG tablet Take 1 tablet (250 mg total) by mouth 2 (two) times daily with a meal. 60 tablet 1    Drug Regimen Review Drug regimen was reviewed and remains  appropriate with no significant issues identified  Home: Patient lives with his sister.  Requires supervision for safety.  1 level apartment     Functional History: Supervision prior to admission    Functional Status:  Mobility: Min mod assist ambulation 18 feet          ADL: Moderate assist overall for ADLs needing some encouragement.    Cognition: Patient is autistic      Physical Exam: 128/70 pulse 80 temperature 98 respirations 18 oxygen saturation 92%  Physical Exam  Nursing note and vitals reviewed. Constitutional: He appears well-developed and well-nourished. No distress.  Awake, alert, appropriate, poor eye contact- kept looking exactly where I wasn't; smiling, sweet; denies pain and SOB, NAD lying in bed; on L side  HENT:  Head: Normocephalic and atraumatic.  Nose: Nose normal.  Mouth/Throat: Oropharynx is clear and moist. No oropharyngeal exudate.  Trach in place; capped; no significant secretions  Eyes: Pupils are equal, round, and reactive to light. EOM are normal. Right eye exhibits no discharge. Left eye exhibits no discharge.  Neck: No tracheal deviation present.  Tracheostomy tube in place.  Cardiovascular: Normal rate, regular rhythm and normal heart sounds.  RRR, no M/R/G  Respiratory: Effort normal. No stridor. No respiratory distress.  A little coarse but good air movement; no W/R/R Trach capped A dry cough intermittently  GI:  Soft, NT, ND; (+) hypoactive BS; protuberant  Musculoskeletal:     Cervical back: Normal range of motion and neck supple.     Comments: No joint swelling  UEs- 5-/5 B/L- deltoids, biceps, triceps, WE, grip and finger abduction  LEs- HF 3+/5 B/L; otherwise 4+/5 in LEs in KE/KF/DF/PF B/L  Neurological: No cranial nerve deficit.  Patient is alert makes good eye contact with examiner.  Patient is autistic which did limit overall exam.  He does provide his name and follow simple commands.  Intact sensation in all 4  extremities to light touch x4   Skin: Skin is warm and dry.  No bruising or skin issues seen  Psychiatric:  Smiling; concrete          Medical Problem List and Plan: 1.  Debility secondary to chronic respiratory failure pulmonary fibrosis complicated by COVID-19 with pneumonia.  Continue CPAP.  Patient with chronic O2 2 L prior to admission.  -patient may  Shower when trach capped or decannulated  -ELOS/Goals: 14-18 days; mod I- supervision  2.  Antithrombotics: -DVT/anticoagulation: Subcutaneous heparin.  Check vascular study  -antiplatelet therapy: N/A 3. Pain Management: Tylenol as needed 4. Mood: Celexa 10 mg daily, clonazepam 0.5 mg nightly, melatonin 3 mg nightly  -antipsychotic agents: N/A 5. Neuropsych: This patient is not capable of making decisions on his own behalf. 6. Skin/Wound Care: Routine skin checks 7. Fluids/Electrolytes/Nutrition: Routine in and outs with follow-up chemistries 8.  Tracheostomy.  Currently with a #6 cuffless capped trach.  Plan decannulation.Plan to decannulate 03/05/2019 9.  Chronic anemia with sickle cell trait.  Continue iron supplement 10.  Hypertension.  Lasix 20 mg daily.  Monitor with increased mobility 11. Possible constipation- will need to monitor- pt said doing well; but will monitor just in case.  12. Pulmonary fibrosis-  was on 2L at al times at home.  13. Previous ARDS/Sepsis/shock- has recovered, and treated for MRSA- finished Vanc 14. Autism with ADHD- will monitor for chronic condition if needs specific way to approach pt due to his Autism- many autistic patients cannot handle loud sounds, people in personal space, etc.     Charlton Amor, PA-C 03/01/2019   I have personally performed a face to face diagnostic evaluation of this patient and formulated the key components of the plan.  Additionally, I have personally reviewed laboratory data, imaging studies, as well as relevant notes and concur with the physician assistant's  documentation above.   The patient's status has not changed from the original H&P.  Any changes in documentation from the acute care chart have been noted above.

## 2019-03-01 NOTE — PMR Pre-admission (Signed)
PMR Admission Coordinator Pre-Admission Assessment  Patient: Juan Cameron is an 44 y.o., male MRN: [id number] DOB: [birth year] Height: 5\' 9"  (1.753 m) Weight: 95.7 kg  Insurance Information HMO:    PPO:      PCP:      IPA:      80/20:      OTHER:  PRIMARY: Medicare A and B      Policy#: [id number]      Subscriber: patient CM Name:       Phone#:      Fax#:  Pre-Cert#: verified eligibility online      Employer:  Benefits:  Phone #:      Name:  Eff. Date: A and B 04/11/97     Deduct: $1484      Out of Pocket Max:       Life Max:  CIR: 100%      SNF: 20 full days Outpatient: 80%     Co-Pay: 20% Home Health: 100%      Co-Pay:  DME: 80%     Co-Pay: 20% Providers: pt choice   SECONDARY: Medicaid      Policy#: [id number]      Subscriber:  CM Name:       Phone#:      Fax#:  Pre-Cert#:       Employer:  Benefits:  Phone #:      Name:  Eff. Date:      Deduct:       Out of Pocket Max:       Life Max:  CIR:       SNF:  Outpatient:      Co-Pay:  Home Health:       Co-Pay:  DME:      Co-Pay:   Medicaid Application Date:       Case Manager:  Disability Application Date:       Case Worker:   The "Data Collection Information Summary" for patients in Inpatient Rehabilitation Facilities with attached "Privacy Act [place] Records" was provided and verbally reviewed with: Patient  Emergency Contact Information Contact Information    Name Relation Home Work Mobile   Jetter,[name] Sister   [phone]   [name]   [phone]   [name] Relative   [phone]      Current Medical History  Patient Admitting Diagnosis: debility, post Covid   History of Present Illness: Juan Cameron is a 45 year old right-handed African-American male with history significant for autism with element of attention deficit hyperactivity disorder as well as pulmonary fibrosis with 2 L oxygen at home, hypertension, hyperlipidemia, chronic anemia and sickle cell trait.  Required  supervision prior to admission for safety and attends adult daycare.   Patient well-known to rehab services from admission 06/10/2015 to 06/24/2015 for debilitation secondary to chronic respiratory failure.  Presented to [facility] January 07, 2019 with increasing shortness of breath, found to have COVID-19 pneumonia, as well as multifocal bacterial pneumonia.  Patient was treated with dexamethasone and tracheal aspirate positive for MRSA completing 14-day course of vancomycin.  Patient's hospital course complicated by development of sepsis with septic shock ARDS.  Hospital course ventilatory support with tracheostomy performed, complicated by some tracheal stoma bleeding requiring embolization. [facility]  He currently has a #6 cuffless Trach and has been capped for greater than 72 hours and documentation states ready for decannulation.  Maintained on subcutaneous heparin for DVT prophylaxis.  Initially with nasogastric tube for  nutritional support diet has been advanced to regular diet with thin liquids.  Therapy evaluations were completed and pt was recommended for a comprehensive rehab program.     Patient's medical record from [facility] has been reviewed by the rehabilitation admission coordinator and physician.  Past Medical History  Past Medical History:  Diagnosis Date  . Acute on chronic respiratory failure with hypoxia (HCC)   . Autism   . Autism   . COVID-19 virus infection   . Pneumonia 02/2015  . Pneumonia due to COVID-19 virus   . Postinflammatory pulmonary fibrosis (HCC)   . Sickle cell trait (HCC)     Family History   family history includes Heart attack in his mother.  Prior Rehab/Hospitalizations Has the patient had prior rehab or hospitalizations prior to admission? Yes  Has the patient had major surgery during 100 days prior to admission? Yes   Current Medications No current  facility-administered medications for this encounter.  Patients Current Diet:  Diet Order            Diet regular Room service appropriate? Yes with Assist; Fluid consistency: Thin  Diet effective now              Precautions / Restrictions     Has the patient had 2 or more falls or a fall with injury in the past year? No  Prior Activity Level  supervision for all mobility and ADLs.  Attends adult daycare program.   Prior Functional Level Self Care: Did the patient need help bathing, dressing, using the toilet or eating? Needed some help  Indoor Mobility: Did the patient need assistance with walking from room to room (with or without device)? Needed some help  Stairs: Did the patient need assistance with internal or external stairs (with or without device)? Needed some help  Functional Cognition: Did the patient need help planning regular tasks such as shopping or remembering to take medications? Dependent  Home Assistive Devices / Equipment    Prior Device Use: Indicate devices/aids used by the patient prior to current illness, exacerbation or injury? None of the above   Prior Functional Level Current Functional Level  Bed Mobility   supervision Min assist   Transfers  supervision Mod assist   Mobility - Walk/Wheelchair  supervision Mod assist   Upper Body Dressing  supervision Mod assist   Lower Body Dressing  Supervision  Mod assist   Grooming  Supervision  Mod assist   Eating/Drinking   supervision   Min assist   Toilet Transfer  supervision Mod assist   Bladder Continence   continent  continent   Bowel Management  continent  continent   Stair Climbing  supervision     Communication  supervision  min assist   Memory  supervision min assist   Driving   n/a     Special needs/care consideration BiPAP/CPAP CPAP at night CPM no Continuous Drip IV no Dialysis no        Days n/a Life Vest no Oxygen 2L at baseline Special Bed  no Trach Size shiley 6 cuffless, capped  Wound Vac (area) no      Location n/a Skin healed MDAPI to peri-trach area                  Bowel mgmt: continent Bladder mgmt: continent Diabetic mgmt: no Behavioral consideration yes, autism Chemo/radiation no   Previous Home Environment (from acute therapy documentation)    Discharge Living Setting    Social/Family/Support  Systems    Goals/Additional Needs    Decrease burden of Care through IP rehab admission: n/a  Possible need for SNF placement upon discharge: not anticipated   Patient Condition: I have reviewed medical records from [facility], spoken with CM, and patient and family member. I discussed via phone for inpatient rehabilitation assessment.  Patient will benefit from ongoing PT, OT and SLP, can actively participate in 3 hours of therapy a day 5 days of the week, and can make measurable gains during the admission.  Patient will also benefit from the coordinated team approach during an Inpatient Acute Rehabilitation admission.  The patient will receive intensive therapy as well as Rehabilitation physician, nursing, social worker, and care management interventions.  Due to safety, skin/wound care, disease management, medication administration, pain management and patient education the patient requires 24 hour a day rehabilitation nursing.  The patient is currently min to mod assist with mobility and basic ADLs.  Discharge setting and therapy post discharge at home  is anticipated.  Patient has agreed to participate in the Acute Inpatient Rehabilitation Program and will admit today.  Preadmission Screen Completed By:  Stephania Fragmin, PT, DPT 03/01/2019 9:42 AM ______________________________________________________________________   Discussed status with Dr. Berline Chough on 03/01/19  at 10:09 AM  and received approval for admission today.  Admission Coordinator:  Stephania Fragmin, PT, DPT time 10:09 AM Dorna Bloom 03/01/19     Assessment/Plan: Diagnosis: 1. Does the need for close, 24 hr/day Medical supervision in concert with the patient's rehab needs make it unreasonable for this patient to be served in a less intensive setting? Yes 2. Co-Morbidities requiring supervision/potential complications: Autism, ADHD, pulmonary fibrosis, 2L O2 at home; anemia and sickle cell trait; COVID pneumonia 3. Due to bladder management, bowel management, safety, skin/wound care, disease management, medication administration, pain management and patient education, does the patient require 24 hr/day rehab nursing? Yes 4. Does the patient require coordinated care of a physician, rehab nurse, PT, OT, and SLP to address physical and functional deficits in the context of the above medical diagnosis(es)? Yes Addressing deficits in the following areas: balance, endurance, locomotion, strength, transferring, bowel/bladder control, bathing, dressing, feeding, grooming, toileting and psychosocial support 5. Can the patient actively participate in an intensive therapy program of at least 3 hrs of therapy 5 days a week? Yes 6. The potential for patient to make measurable gains while on inpatient rehab is good 7. Anticipated functional outcomes upon discharge from inpatient rehab: supervision PT, supervision OT, supervision SLP 8. Estimated rehab length of stay to reach the above functional goals is: 14-18 days 9. Anticipated discharge destination: Home 10. Overall Rehab/Functional Prognosis: good  MD Signature

## 2019-03-01 NOTE — IPOC Note (Signed)
Individualized overall Plan of Care [facility]) Patient Details Name: Juan Cameron MRN: [id number] DOB: Jul 25, 1975  Admitting Diagnosis: Debility  Hospital Problems: Principal Problem:   Debility Active Problems:   Status post tracheostomy (HCC)   Sleep disturbance   Supplemental oxygen dependent     Functional Problem List: Nursing Bowel, Endurance, Safety  PT Balance, Endurance, Motor, Perception, Safety, Behavior  OT Balance, Endurance, Motor, Safety  SLP    TR         Basic ADL's: OT Grooming, Bathing, Dressing, Toileting     Advanced  ADL's: OT       Transfers: PT Bed Mobility, Bed to Chair, Car, Occupational psychologist, Research scientist (life sciences): PT Ambulation, Psychologist, prison and probation services, Stairs     Additional Impairments: OT None  SLP        TR      Anticipated Outcomes Item Anticipated Outcome  Self Feeding    Swallowing      Basic self-care  Supervision  Toileting  Mod I   Bathroom Transfers Supervision shower - Mod I toilet  Bowel/Bladder  cont x 2, min assist  Transfers  Mod I with LRAD  Locomotion  Supervision with LRAD  Communication     Cognition     Pain  less than 3  Safety/Judgment  supervision assist   Therapy Plan: PT Intensity: Minimum of 1-2 x/day ,45 to 90 minutes PT Frequency: 5 out of 7 days PT Duration Estimated Length of Stay: 12-14 days OT Intensity: Minimum of 1-2 x/day, 45 to 90 minutes OT Frequency: 5 out of 7 days OT Duration/Estimated Length of Stay: 12-14 days      Team Interventions: Nursing Interventions Patient/Family Education, Bowel Management, Disease Management/Prevention, Cognitive Remediation/Compensation, Discharge Planning  PT interventions Ambulation/gait training, Discharge planning, Functional mobility training, Psychosocial support, Therapeutic Activities, Visual/perceptual remediation/compensation, Warden/ranger, Disease management/prevention, Neuromuscular re-education, Therapeutic  Exercise, Wheelchair propulsion/positioning, Cognitive remediation/compensation, DME/adaptive equipment instruction, Splinting/orthotics, UE/LE Strength taining/ROM, Community reintegration, Development worker, international aid stimulation, Equities trader education, Museum/gallery curator, UE/LE Coordination activities  OT Interventions Warden/ranger, Firefighter, Discharge planning, Disease mangement/prevention, Fish farm manager, Functional mobility training, Pain management, Patient/family education, Psychosocial support, Self Care/advanced ADL retraining, Therapeutic Activities, Therapeutic Exercise, UE/LE Strength taining/ROM  SLP Interventions    TR Interventions    SW/CM Interventions Discharge Planning, Disease Management/Prevention, Psychosocial Support, Patient/Family Education   Barriers to Discharge MD  Medical stability, Trach, and Behavior  Nursing      PT Other (comments) elevated HR with mobility, poor endurance, decreased awareness  OT      SLP      SW [employer]     Team Discharge Planning: Destination: PT-Home ,OT- Home , SLP-Home Projected Follow-up: PT-Home health PT, OT-  Home health OT, SLP-None Projected Equipment Needs: PT-To be determined, OT- Tub/shower seat, SLP-None recommended by SLP Equipment Details: PT-states that he has a RW at home (need to confirm with family), OT-  Patient/family involved in discharge planning: PT- Patient,  OT-Patient, Family member/caregiver, SLP-Patient, Family member/caregiver  MD ELOS: 10-14 days. Medical Rehab Prognosis:  Good Assessment: 44 year old right-handed African-American male with history significant for autism with element of attention deficit hyperactivity disorder as well as pulmonary fibrosis with 2 L oxygen at home, hypertension, hyperlipidemia, chronic anemia and sickle cell trait who lives with his sister in a 1 level Apt. 2 steps to entry.  Required supervision prior to admission for safety and  attends adult daycare   Patient well-known to rehab services from admission 06/10/2015  to 06/24/2015 for debilitation secondary to chronic respiratory failure.  [facility] January 07, 2019 with increasing shortness of breath found to have COVID-19 pneumonia as well as multifocal bacterial pneumonia.  Patient was treated with dexamethasone and tracheal aspirate positive for MRSA completing 14-day course of vancomycin.  Patient's hospital course complicated by development of sepsis with septic shock and ARDS.  Hospital course ventilatory support with tracheostomy performed complicated by some tracheal stoma bleeding requiring embolization..  [facility]  He currently has a #6 cuffless Trach and has been capped for greater than 72 hours and documentation states ready for decannulation.  Initially with nasogastric tube for nutritional support diet has been advanced to regular.  Patient with resulting functional deficits with mobility, endurance, self-care.  Will set goals for Supervision/Mod I with PT/OT.   Due to the current state of emergency, patients may not be receiving their 3-hours of Medicare-mandated therapy.  See Team Conference Notes for weekly updates to the plan of care

## 2019-03-02 ENCOUNTER — Inpatient Hospital Stay (HOSPITAL_COMMUNITY): Payer: Medicare Other | Admitting: Occupational Therapy

## 2019-03-02 ENCOUNTER — Inpatient Hospital Stay (HOSPITAL_COMMUNITY): Payer: Medicare Other

## 2019-03-02 ENCOUNTER — Inpatient Hospital Stay (HOSPITAL_COMMUNITY): Payer: Medicare Other | Admitting: Speech Pathology

## 2019-03-02 ENCOUNTER — Ambulatory Visit (HOSPITAL_COMMUNITY): Payer: Medicare Other | Attending: Physician Assistant

## 2019-03-02 DIAGNOSIS — D72829 Elevated white blood cell count, unspecified: Secondary | ICD-10-CM

## 2019-03-02 DIAGNOSIS — R5381 Other malaise: Principal | ICD-10-CM

## 2019-03-02 DIAGNOSIS — D573 Sickle-cell trait: Secondary | ICD-10-CM

## 2019-03-02 DIAGNOSIS — I10 Essential (primary) hypertension: Secondary | ICD-10-CM

## 2019-03-02 DIAGNOSIS — G479 Sleep disorder, unspecified: Secondary | ICD-10-CM

## 2019-03-02 DIAGNOSIS — M7989 Other specified soft tissue disorders: Secondary | ICD-10-CM | POA: Diagnosis not present

## 2019-03-02 DIAGNOSIS — Z9981 Dependence on supplemental oxygen: Secondary | ICD-10-CM

## 2019-03-02 DIAGNOSIS — Z930 Tracheostomy status: Secondary | ICD-10-CM

## 2019-03-02 LAB — CBC WITH DIFFERENTIAL/PLATELET
Abs Immature Granulocytes: 0.14 10*3/uL — ABNORMAL HIGH (ref 0.00–0.07)
Basophils Absolute: 0.1 10*3/uL (ref 0.0–0.1)
Basophils Relative: 1 %
Eosinophils Absolute: 0.9 10*3/uL — ABNORMAL HIGH (ref 0.0–0.5)
Eosinophils Relative: 8 %
HCT: 34.4 % — ABNORMAL LOW (ref 39.0–52.0)
Hemoglobin: 11.3 g/dL — ABNORMAL LOW (ref 13.0–17.0)
Immature Granulocytes: 1 %
Lymphocytes Relative: 22 %
Lymphs Abs: 2.4 10*3/uL (ref 0.7–4.0)
MCH: 27.8 pg (ref 26.0–34.0)
MCHC: 32.8 g/dL (ref 30.0–36.0)
MCV: 84.7 fL (ref 80.0–100.0)
Monocytes Absolute: 1.2 10*3/uL — ABNORMAL HIGH (ref 0.1–1.0)
Monocytes Relative: 11 %
Neutro Abs: 6.4 10*3/uL (ref 1.7–7.7)
Neutrophils Relative %: 57 %
Platelets: 356 10*3/uL (ref 150–400)
RBC: 4.06 MIL/uL — ABNORMAL LOW (ref 4.22–5.81)
RDW: 18.1 % — ABNORMAL HIGH (ref 11.5–15.5)
WBC: 11.2 10*3/uL — ABNORMAL HIGH (ref 4.0–10.5)
nRBC: 0 % (ref 0.0–0.2)

## 2019-03-02 LAB — COMPREHENSIVE METABOLIC PANEL
ALT: 32 U/L (ref 0–44)
AST: 22 U/L (ref 15–41)
Albumin: 3.2 g/dL — ABNORMAL LOW (ref 3.5–5.0)
Alkaline Phosphatase: 97 U/L (ref 38–126)
Anion gap: 10 (ref 5–15)
BUN: 11 mg/dL (ref 6–20)
CO2: 28 mmol/L (ref 22–32)
Calcium: 9.4 mg/dL (ref 8.9–10.3)
Chloride: 100 mmol/L (ref 98–111)
Creatinine, Ser: 1.13 mg/dL (ref 0.61–1.24)
GFR calc Af Amer: >60 mL/min (ref >60)
GFR calc non Af Amer: >60 mL/min (ref >60)
Glucose, Bld: 103 mg/dL — ABNORMAL HIGH (ref 70–99)
Potassium: 3.9 mmol/L (ref 3.5–5.1)
Sodium: 138 mmol/L (ref 135–145)
Total Bilirubin: 0.7 mg/dL (ref 0.3–1.2)
Total Protein: 7.4 g/dL (ref 6.5–8.1)

## 2019-03-02 LAB — MRSA PCR SCREENING: MRSA by PCR: POSITIVE — AB

## 2019-03-02 NOTE — Evaluation (Signed)
Occupational Therapy Assessment and Plan  Patient Details  Name: Juan Cameron MRN: [id number] Date of Birth: December 31, 1975  OT Diagnosis: cognitive deficits and muscle weakness (generalized) Rehab Potential: Rehab Potential (ACUTE ONLY): Good ELOS: 12-14 days   Today's Date: 03/02/2019 OT Individual Time: 5916-3846 OT Individual Time Calculation (min): 72 min     Problem List:  Patient Active Problem List   Diagnosis Date Noted  . Status post tracheostomy ([place])   . Sleep disturbance   . Supplemental oxygen dependent   . Acute on chronic respiratory failure with hypoxia ([place])   . COVID-19 virus infection   . Pneumonia due to COVID-19 virus   . Postinflammatory pulmonary fibrosis ([place])   . Autism   . Dysphagia   . Acute blood loss anemia   . Anemia of chronic disease   . Respiratory failure ([place]) 06/10/2015  . Acute on chronic respiratory failure with hypoxia ([place])   . Chronic diastolic CHF (congestive heart failure) ([place])   . Sinus tachycardia   . Acute renal failure ([place])   . Sickle cell trait ([place])   . Essential hypertension   . SOB (shortness of breath)   . Pneumonia   . Acute pulmonary edema ([facility])   . Hypoxemia   . Sepsis ([place])   . Cough   . Right knee pain   . Cognitive deficits   . Tachycardia   . Debility 05/06/2015  . Encephalopathy 05/06/2015  . 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 Ventilator dependence ([place]) 04/08/2015  . Pressure ulcer 04/05/2015  . Acute respiratory failure ([place])   . Fever 03/21/2015  . Acute renal insufficiency 03/21/2015  . Obesity 03/21/2015  . ARDS (adult respiratory distress syndrome) ([place])   . HCAP (healthcare-associated pneumonia) 03/11/2015  . Leukocytosis 03/11/2015  . Acute hyperglycemia 03/11/2015  . Left ventricular diastolic dysfunction, NYHA class 2 03/11/2015  . Thrombocytopenia ([place]) 03/11/2015  . Acute lung injury 03/10/2015  . Acute respiratory failure with hypoxia ([place]) 02/28/2015  . CAP (community acquired  pneumonia) 02/28/2015  . Hypokalemia 02/28/2015  . Autism 02/28/2015    Past Medical History:  Past Medical History:  Diagnosis Date  . Acute on chronic respiratory failure with hypoxia ([place])   . Autism   . Autism   . COVID-19 virus infection   . Pneumonia 02/2015  . Pneumonia due to COVID-19 virus   . Postinflammatory pulmonary fibrosis ([place])   . Sickle cell trait [facility])    Past Surgical History:  Past Surgical History:  Procedure Laterality Date  . NO PAST SURGERIES      Assessment & Plan Clinical Impression: Patient is a 44 y.o. right-handed African-American male with history significant for autism with element of attention deficit hyperactivity disorder as well as pulmonary fibrosis with 2 L oxygen at home, hypertension, hyperlipidemia, chronic anemia and sickle cell trait who lives with his sister in a 1 level Apt. 2 steps to entry. Required supervision prior to admission for safety and attends adult daycare Patient well-known to rehab services from admission 06/10/2015 to 06/24/2015 for debilitation secondary to chronic respiratory failure. [facility] January 07, 2019 with increasing shortness of breath found to have COVID-19 pneumonia as well as multifocal bacterial pneumonia. Patient was treated with dexamethasone and tracheal aspirate positive for MRSA completing 14-day course of vancomycin. Patient's hospital course complicated by development of sepsis with septic shock ARDS. Hospital course ventilatory support with tracheostomy performed complicated by some tracheal stoma bleeding requiring embolization.. [facility] He currently has a #6 cuffless Trach and has been capped for greater than 72 hours and documentation states ready for decannulation. Maintained on subcutaneous heparin for DVT prophylaxis. Initially with nasogastric tube for nutritional support diet has been advanced to regular. Patient  was admitted for a comprehensive rehab program.  Patient transferred to CIR on 03/01/2019 .    Patient currently requires min-mod assist with basic self-care skills secondary to muscle weakness, decreased cardiorespiratoy endurance and decreased oxygen support and decreased standing balance and decreased balance strategies.  Prior to hospitalization, patient could complete ADLs with modified independent/supervision.  Patient will benefit from skilled intervention to decrease level of assist with basic self-care skills prior to discharge home with care partner.  Anticipate patient will require 24 hour supervision and follow up home health.  OT - End of Session Activity Tolerance: Tolerates 30+ min activity with multiple rests Endurance Deficit: Yes Endurance Deficit Description: elevated HR 130s-140s during activity OT Assessment Rehab Potential (ACUTE ONLY): Good OT Patient demonstrates impairments in the following area(s): Balance;Endurance;Motor;Safety OT Basic ADL's Functional Problem(s): Grooming;Bathing;Dressing;Toileting OT Transfers Functional Problem(s): Toilet;Tub/Shower OT Additional Impairment(s): None OT Plan OT Intensity: Minimum of 1-2 x/day, 45 to 90 minutes OT Frequency: 5 out of 7 days OT Duration/Estimated Length of Stay: 12-14 days OT Treatment/Interventions: Medical illustrator training;Community reintegration;Discharge planning;Disease mangement/prevention;DME/adaptive equipment instruction;Functional mobility training;Pain management;Patient/family education;Psychosocial support;Self Care/advanced ADL retraining;Therapeutic Activities;Therapeutic Exercise;UE/LE Strength taining/ROM OT Basic Self-Care Anticipated Outcome(s): Supervision OT Toileting Anticipated Outcome(s): Mod I OT Bathroom Transfers Anticipated Outcome(s): Supervision shower - Mod I toilet OT Recommendation Recommendations for Other Services: Therapeutic Recreation consult Therapeutic Recreation  Interventions: Other (comment)(leisure pursuits) Patient destination: Home Follow Up Recommendations: Home health OT Equipment Recommended: Tub/shower seat   Skilled Therapeutic Intervention OT eval completed with discussion of rehab process, OT purpose, POC, ELOS, and goals.  ADL assessment completed at sit > stand level with focus on sequencing, activity tolerance during self-care tasks.  Pt completed bed mobility with CGA and stand pivot transfer to w/c with Min assist.  Bathing and dressing completed at sit > stand level at sink, noted elevated HR 143 after standing for LB bathing and dressing, decreased to 118-125 with rest.  O2 sats remained 92-96% on room air during activity, dropping to 88% briefly after standing but returning to low-mid 90s with rest.  Required mod assist for LB dressing due to difficulty advancing pants over feet, but able to pull pants over hips in standing with min assist for standing balance.  Engaged in [place] activity in sitting due to elevated HR with activity.  Pt pleasantly engaged with therapist with focus on rapport building.  Ambulated 10 feet without AD with min assist for stability.  Returned to room and left upright in w/c with chair alarm on and sister in room.   OT Evaluation Precautions/Restrictions  Precautions Precautions: Fall Restrictions Weight Bearing Restrictions: No Pain Pain Assessment Pain Scale: 0-10 Pain Score: 0-No pain Home Living/Prior Functioning Home Living Available Help at Discharge: Family, Available 24 hours/day Type of Home: House Home Access: Stairs to enter [employer] of Steps: 2 Home Layout: One level Bathroom Shower/Tub: Government social research officer Accessibility: Yes Additional Comments: inconsistent information due to pt baseline cognition, gleaned information from pt and chart review  Lives With: Family Prior Function Vocation: Unemployed Comments: bowling, watching tv "action  shows", playing [employer] ADL ADL Grooming: Setup Where Assessed-Grooming: Sitting at sink Upper Body Bathing: Setup, Minimal cueing Where Assessed-Upper Body Bathing: Sitting  at sink Lower Body Bathing: Moderate assistance Where Assessed-Lower Body Bathing: Sitting at sink, Standing at sink Upper Body Dressing: Minimal cueing, Setup Where Assessed-Upper Body Dressing: Sitting at sink Lower Body Dressing: Moderate assistance Where Assessed-Lower Body Dressing: Standing at sink, Sitting at sink Toilet Transfer: Minimal assistance Toilet Transfer Method: Stand pivot Vision Baseline Vision/History: No visual deficits Patient Visual Report: No change from baseline Vision Assessment?: No apparent visual deficits Cognition Overall Cognitive Status: History of cognitive impairments - at baseline Arousal/Alertness: Awake/alert Orientation Level: Person;Place Year: 2021(when given choice of 2) Month: February(when given choice of 3) Day of Week: Correct Memory: Appears intact Immediate Memory Recall: Sock;Bed Memory Recall Sock: With Cue Memory Recall Blue: With Cue Memory Recall Bed: Without Cue Attention: Sustained;Selective Sustained Attention: Appears intact Selective Attention: Appears intact Sensation Sensation Light Touch: Appears Intact Proprioception: Appears Intact Coordination Fine Motor Movements are Fluid and Coordinated: Yes Extremity/Trunk Assessment RUE Assessment RUE Assessment: Within Functional Limits General Strength Comments: 5/5 LUE Assessment LUE Assessment: Within Functional Limits General Strength Comments: 5/5     Refer to Care Plan for Long Term Goals  Recommendations for other services: Therapeutic Recreation  Other leisure pursuits   Discharge Criteria: Patient will be discharged from OT if patient refuses treatment 3 consecutive times without medical reason, if treatment goals not met, if there is a change in medical status, if patient makes no  progress towards goals or if patient is discharged from hospital.  The above assessment, treatment plan, treatment alternatives and goals were discussed and mutually agreed upon: by patient  Simonne Come 03/02/2019, 12:55 PM

## 2019-03-02 NOTE — Care Management (Signed)
Patient Details  Name: Juan Cameron MRN: [id number] Date of Birth: Mar 22, 1975  Today's Date: 03/02/2019  Problem List:  Patient Active Problem List   Diagnosis Date Noted  . Status post tracheostomy ([facility])   . Sleep disturbance   . Supplemental oxygen dependent   . Acute on chronic respiratory failure with hypoxia ([facility])   . COVID-19 virus infection   . Pneumonia due to COVID-19 virus   . Postinflammatory pulmonary fibrosis ([facility])   . Autism   . Dysphagia   . Acute blood loss anemia   . Anemia of chronic disease   . Respiratory failure (H[facility]) 06/10/2015  . Acute on chronic respiratory failure with hypoxia (H[facility])   . Chronic diastolic CHF (congestive heart failure) (H[facility])   . Sinus tachycardia   . Acute renal failure (H[facility])   . Sickle cell trait ([facility])   . Essential hypertension   . SOB (shortness of breath)   . Pneumonia   . Acute pulmonary edema (H[facility])   . Hypoxemia   . Sepsis (H[facility])   . Cough   . Right knee pain   . Cognitive deficits   . Tachycardia   . Debility 05/06/2015  . Encephalopathy 05/06/2015  . 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 Ventilator dependence (H[facility]) 04/08/2015  . Pressure ulcer 04/05/2015  . Acute respiratory failure (H[facility])   . Fever 03/21/2015  . Acute renal insufficiency 03/21/2015  . Obesity 03/21/2015  . ARDS (adult respiratory distress syndrome) (HCC)   . HCAP (healthcare-associated pneumonia) 03/11/2015  . Leukocytosis 03/11/2015  . Acute hyperglycemia 03/11/2015  . Left ventricular diastolic dysfunction, NYHA class 2 03/11/2015  . Thrombocytopenia (H[facility]) 03/11/2015  . Acute lung injury 03/10/2015  . Acute respiratory failure with hypoxia (H[facility]) 02/28/2015  . CAP (community acquired pneumonia) 02/28/2015  . Hypokalemia 02/28/2015  . Autism 02/28/2015   Past Medical History:  Past Medical History:  Diagnosis Date  . Acute on chronic respiratory failure with hypoxia (HCC)   . Autism   . Autism   . COVID-19 virus infection   .  Pneumonia 02/2015  . Pneumonia due to COVID-19 virus   . Postinflammatory pulmonary fibrosis (H[facility])   . Sickle cell trait [facility])    Past Surgical History:  Past Surgical History:  Procedure Laterality Date  . NO PAST SURGERIES     Social History:  reports that he has never smoked. He has never used smokeless tobacco. He reports that he does not drink alcohol or use drugs.  Family / Support Systems Anticipated Caregiver: Sister, [name] Ability/Limitations of Caregiver: sister, as well as other family members, and friends provide 24/7 supervision at baseline Caregiver Availability: 24/7  Social History Preferred language: English Religion: None     Abuse/Neglect Abuse/Neglect Assessment Can Be Completed: Yes Physical Abuse: Denies Verbal Abuse: Denies Sexual Abuse: Denies Exploitation of patient/patient's resources: Denies Self-Neglect: Denies  Emotional Status Pt's affect, behavior and adjustment status: Normal affect, mood and behavior Psychiatric History: Autism/ADHD  Patient / Family Perceptions, Expectations & Goals Pt/Family understanding of illness & functional limitations: Sister appears to have a good understanding of current health status and functional limitations Premorbid pt/family roles/activities: Brother; attended adult daycare Anticipated changes in roles/activities/participation: Will need supervision at discharge Pt/family expectations/goals: The sister would like for the patient to get back to his pre-admission functional status and be able to attend the adult daycare  [employer] available at discharge: Sister/family will provide transportation as needed  Discharge Planning Living Arrangements: Other relatives Support Systems:  Other relatives Type of Residence: Private residence Money Management: Family Does the patient have any problems obtaining your medications?: No Home Management: Sister will manage the  home Patient/Family Preliminary Plans: Discharge home with sister and family assisting with supervision [place] Barriers to Discharge: Strum Work Anticipated Follow Up Needs: [place] Additional Notes/Comments: Discharge home with sister Expected length of stay: 14-18 days  Clinical Impression The patient is excited to discuss "going home". That "makes me happy"!. He was also able to relay the information discussed about discharge planning with a family member on the phone and noted he wore oxygen only at night at home before admission.   [name] 03/02/2019, 2:45 PM

## 2019-03-02 NOTE — Evaluation (Signed)
Physical Therapy Assessment and Plan  Patient Details  Name: Juan Cameron MRN: [id number] Date of Birth: May 03, 1975  PT Diagnosis: Abnormal posture, Abnormality of gait, Cognitive deficits, Difficulty walking, Impaired cognition and Muscle weakness Rehab Potential: Good ELOS: 12-14 days   Today's Date: 03/02/2019 PT Individual Time: 1330-1430 PT Individual Time Calculation (min): 60 min    Problem List:  Patient Active Problem List   Diagnosis Date Noted  . Status post tracheostomy ([place])   . Sleep disturbance   . Supplemental oxygen dependent   . Acute on chronic respiratory failure with hypoxia ([place])   . COVID-19 virus infection   . Pneumonia due to COVID-19 virus   . Postinflammatory pulmonary fibrosis ([place])   . Autism   . Dysphagia   . Acute blood loss anemia   . Anemia of chronic disease   . Respiratory failure ([place]) 06/10/2015  . Acute on chronic respiratory failure with hypoxia ([place])   . Chronic diastolic CHF (congestive heart failure) ([place])   . Sinus tachycardia   . Acute renal failure ([place])   . Sickle cell trait ([place])   . Essential hypertension   . SOB (shortness of breath)   . Pneumonia   . Acute pulmonary edema ([facility])   . Hypoxemia   . Sepsis ([place])   . Cough   . Right knee pain   . Cognitive deficits   . Tachycardia   . Debility 05/06/2015  . Encephalopathy 05/06/2015  . 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 Ventilator dependence ([place]) 04/08/2015  . Pressure ulcer 04/05/2015  . Acute respiratory failure ([place])   . Fever 03/21/2015  . Acute renal insufficiency 03/21/2015  . Obesity 03/21/2015  . ARDS (adult respiratory distress syndrome) ([place])   . HCAP (healthcare-associated pneumonia) 03/11/2015  . Leukocytosis 03/11/2015  . Acute hyperglycemia 03/11/2015  . Left ventricular diastolic dysfunction, NYHA class 2 03/11/2015  . Thrombocytopenia ([place]) 03/11/2015  . Acute lung injury 03/10/2015  . Acute respiratory failure with hypoxia ([place])  02/28/2015  . CAP (community acquired pneumonia) 02/28/2015  . Hypokalemia 02/28/2015  . Autism 02/28/2015    Past Medical History:  Past Medical History:  Diagnosis Date  . Acute on chronic respiratory failure with hypoxia ([place])   . Autism   . Autism   . COVID-19 virus infection   . Pneumonia 02/2015  . Pneumonia due to COVID-19 virus   . Postinflammatory pulmonary fibrosis ([place])   . Sickle cell trait [facility])    Past Surgical History:  Past Surgical History:  Procedure Laterality Date  . NO PAST SURGERIES      Assessment & Plan Clinical Impression: Patient is a 44 y.o. year old male with history significant for autism with element of attention deficit hyperactivity disorder as well as pulmonary fibrosis with 2 L oxygen at home, hypertension, hyperlipidemia, chronic anemia and sickle cell trait. Required supervision prior to admission for safety and attends adult daycare. Patient well-known to rehab services from admission 06/10/2015 to 06/24/2015 for debilitation secondary to chronic respiratory failure. Presented to [facility] January 07, 2019 with increasing shortness of breath, found to have COVID-19 pneumonia, as well as multifocal bacterial pneumonia. Patient was treated with dexamethasone and tracheal aspirate positive for MRSA completing 14-day course of vancomycin. Patient's hospital course complicated by development of sepsis with septic shock ARDS. Hospital course ventilatory support with tracheostomy performed, complicated by some tracheal stoma bleeding requiring embolization.[facility]. He currently has a #6 cuffless Trachand  has been capped for greater than 72 hours and documentation states ready for decannulation.Maintained on subcutaneous heparin for DVT prophylaxis.Initially with nasogastric tube for nutritional support diet has been advanced to regular diet with thin liquids. Therapy  evaluations were completed and pt was recommended for a comprehensive rehab program.   Patient currently requires min with mobility secondary to muscle weakness and decreased standing balance, decreased postural control and decreased balance strategies. Prior to hospitalization, patient was supervision with mobility and lived with Family in a House home. Home access is 1Stairs to enter.  Patient will benefit from skilled PT intervention to maximize safe functional mobility, minimize fall risk and decrease caregiver burden for planned discharge home with 24 hour supervision. Anticipate patient will benefit from follow up [place] at discharge.  PT - End of Session Activity Tolerance: Tolerates 30+ min activity with multiple rests Endurance Deficit: Yes Endurance Deficit Description: elevated HR 130s-140s during activity, fatigue, required multiple rest breaks throughout session PT Assessment Rehab Potential (ACUTE/IP ONLY): Good PT Barriers to Discharge: Other (comments) PT Barriers to Discharge Comments: elevated HR with mobility, poor endurance, decreased awareness PT Patient demonstrates impairments in the following area(s): Balance;Endurance;Motor;Perception;Safety;Behavior PT Transfers Functional Problem(s): Bed Mobility;Bed to Chair;Car;Furniture PT Locomotion Functional Problem(s): Ambulation;Wheelchair Mobility;Stairs PT Plan PT Intensity: Minimum of 1-2 x/day ,45 to 90 minutes PT Frequency: 5 out of 7 days PT Duration Estimated Length of Stay: 12-14 days PT Treatment/Interventions: Ambulation/gait training;Discharge planning;Functional mobility training;Psychosocial support;Therapeutic Activities;Visual/perceptual remediation/compensation;Balance/vestibular training;Disease management/prevention;Neuromuscular re-education;Therapeutic Exercise;Wheelchair propulsion/positioning;Cognitive remediation/compensation;DME/adaptive equipment instruction;Splinting/orthotics;UE/LE Strength  taining/ROM;Community reintegration;Functional electrical stimulation;Patient/family education;Stair training;UE/LE Coordination activities PT Transfers Anticipated Outcome(s): Mod I with LRAD PT Locomotion Anticipated Outcome(s): Supervision with LRAD PT Recommendation Follow Up Recommendations: Home health PT Patient destination: Home Equipment Recommended: To be determined Equipment Details: states that he has a RW at home (need to confirm with family)  Skilled Therapeutic Intervention Evaluation completed (see details above and below) with education on PT POC and goals and individual treatment initiated with focus on functional mobility/transfers, ambulation, LE strength, balance/coordination, simulated car transfers, stair navigation, and improved endurance with activity. Received pt supine in bed, pt educated on PT evaluation, therapy schedule, and CIR policies and agreeable. Pt denied any pain during session. O2 sat 95% at rest on RA. Pt performed bed mobility with HOB elevated and CGA. Pt reported urge to urinate and ambulated 488f without AD mod A to bathroom. Pt able to urinate while standing with min A and able to manage clothing with CGA. Pt washed hands at sink with CGA and cues to use soap. O2 sat 86% and HR 136bpm; after 2 minute seated rest break and education for pursed lip breathing O2 sat 91% and HR 126bpm. Pt performed WC mobility 22fusing bilateral LEs and supervision. Pt reported fatigue and therapist transported pt remainder of way. Pt performed ambulatory car transfer [id number] AD min A but required mod A to ambulate to car. Pt navigated 4 steps with 2 rails min A ascending and descending with a step to pattern. O2 sat 80% and HR 140bpm; after 3 minute seated rest break O2 sat 95% and HR 83bpm. Pt picked up small cup from floor without AD min A. Pt declined ambulating again and requested to return to room. Stand<>pivot WC<>bed without AD min A. Sit<>supine CGA. Therapist  provided pt with cushion for WC. Concluded session with pt supine in bed, needs within reach, bed alarm on, and NT assessing vitals. Safety plan updated.   PT Evaluation Precautions/Restrictions Precautions Precautions: Fall  Precaution Comments: watch HR Restrictions Weight Bearing Restrictions: No Home Living/Prior Functioning Home Living Living Arrangements: Other relatives Available Help at Discharge: Family;Available 24 hours/day Type of Home: House Home Access: Stairs to enter [employer] of Steps: 1 Entrance Stairs-Rails: None Home Layout: One level Bathroom Accessibility: Yes Additional Comments: inconsistent information due to pt baseline cognition, able to get some information from patient and remainder from chart review  Lives With: Family Prior Function Level of Independence: Needs assistance with ADLs;Needs assistance with gait;Needs assistance with tranfers;Needs assistance with homemaking(pt reported needing assistance with bathing, dressing, cooking, cleaning)  Able to Take Stairs?: Yes Driving: No Vocation: Unemployed Leisure: Hobbies-yes (Comment) Comments: playing Uno and dancing (likes Usher and Roe Rutherford) Cognition Overall Cognitive Status: History of cognitive impairments - at baseline Arousal/Alertness: Awake/alert Orientation Level: Oriented X4 Awareness: Impaired Problem Solving: Impaired Safety/Judgment: Impaired Sensation Sensation Light Touch: Appears Intact Proprioception: Appears Intact Coordination Gross Motor Movements are Fluid and Coordinated: No Fine Motor Movements are Fluid and Coordinated: Yes Coordination and Movement Description: grossly uncoordinated due to bilateral LE weakness, decreased postural control Finger Nose Finger Test: [facility] Heel Shin Test: [facility] but slower Motor  Motor Motor: Abnormal postural alignment and control Motor - Skilled Clinical Observations: grossly uncordinated due to decreased  balance/postural control and bilateral LE weakness  Mobility Bed Mobility Bed Mobility: Rolling Right;Rolling Left;Supine to Sit;Sit to Supine Rolling Right: Supervision/verbal cueing Rolling Left: Supervision/Verbal cueing Supine to Sit: Contact Guard/Touching assist Sit to Supine: Contact Guard/Touching assist Transfers Transfers: Sit to Stand;Stand Pivot Transfers Sit to Stand: Minimal Assistance - Patient > 75% Stand Pivot Transfers: Minimal Assistance - Patient > 75% Stand Pivot Transfer Details: Verbal cues for sequencing;Verbal cues for technique;Verbal cues for precautions/safety Stand Pivot Transfer Details (indicate cue type and reason): verbal cues for step sequence when turning Transfer (Assistive device): None Locomotion  Gait Ambulation: Yes Gait Assistance: Moderate Assistance - Patient 50-74% Gait Distance (Feet): 10 Feet Assistive device: None Gait Assistance Details: Verbal cues for gait pattern;Verbal cues for sequencing;Verbal cues for technique;Verbal cues for precautions/safety Gait Assistance Details: Verbal cues to be aware of obstacles and changes in floor surfaces, cues for increased step length Gait Gait: Yes Gait Pattern: Impaired Gait Pattern: Decreased trunk rotation;Decreased stride length;Decreased step length - right;Decreased step length - left;Poor foot clearance - left;Poor foot clearance - right;Narrow base of support Gait velocity: decreased Stairs / Additional Locomotion Stairs: Yes Stairs Assistance: Minimal Assistance - Patient > 75% Stair Management Technique: Two rails Number of Stairs: 4 Height of Stairs: 6 Wheelchair Mobility Wheelchair Mobility: Yes Wheelchair Assistance: Chartered loss adjuster: Both lower extermities Wheelchair Parts Management: Needs assistance Distance: 20f  Trunk/Postural Assessment  Cervical Assessment Cervical Assessment: Within FWater engineerThoracic  Assessment: Within Functional Limits Lumbar Assessment Lumbar Assessment: Exceptions to WFL(posterior pelvic tilt) Postural Control Postural Control: Deficits on evaluation  Balance Balance Balance Assessed: Yes Static Sitting Balance Static Sitting - Balance Support: Bilateral upper extremity supported Static Sitting - Level of Assistance: 5: Stand by assistance Dynamic Sitting Balance Dynamic Sitting - Balance Support: Bilateral upper extremity supported Dynamic Sitting - Level of Assistance: 5: Stand by assistance Static Standing Balance Static Standing - Balance Support: No upper extremity supported Static Standing - Level of Assistance: 4: Min assist Dynamic Standing Balance Dynamic Standing - Balance Support: No upper extremity supported Dynamic Standing - Level of Assistance: 3: Mod assist Extremity Assessment  RLE Assessment RLE Assessment: Exceptions to [facility] St[facility] Comments: grossly generalized to 4/5  LLE Assessment LLE Assessment: Exceptions to [facility] General Strength Comments: grossly generalized to 4/5  Refer to Care Plan for Long Term Goals  Recommendations for other services: None   Discharge Criteria: Patient will be discharged from PT if patient refuses treatment 3 consecutive times without medical reason, if treatment goals not met, if there is a change in medical status, if patient makes no progress towards goals or if patient is discharged from hospital.  The above assessment, treatment plan, treatment alternatives and goals were discussed and mutually agreed upon: by patient  Alfonse Alpers PT, DPT  03/02/2019, 8:37 AM

## 2019-03-02 NOTE — H&P (Signed)
  Physical Medicine and Rehabilitation Admission H&P    : HPI: Juan Cameron is a 44-year-old right-handed African-American male with history significant for autism with element of attention deficit hyperactivity disorder as well as pulmonary fibrosis with 2 L oxygen at home, hypertension, hyperlipidemia, chronic anemia and sickle cell trait who lives with his sister in a 1 level Apt. 2 steps to entry.  Required supervision prior to admission for safety and attends adult daycare   Patient well-known to rehab services from admission 06/10/2015 to 06/24/2015 for debilitation secondary to chronic respiratory failure.  Presented [facility] January 07, 2019 with increasing shortness of breath found to have COVID-19 pneumonia as well as multifocal bacterial pneumonia.  Patient was treated with dexamethasone and tracheal aspirate positive for MRSA completing 14-day course of vancomycin.  Patient's hospital course complicated by development of sepsis with septic shock ARDS.  Hospital course ventilatory support with tracheostomy performed complicated by some tracheal stoma bleeding requiring embolization..  [facility]  He currently has a #6 cuffless Trach and has been capped for greater than 72 hours and documentation states ready for decannulation.  Maintained on subcutaneous heparin for DVT prophylaxis.  Initially with nasogastric tube for nutritional support diet has been advanced to regular.  Patient was admitted for a comprehensive rehab program.  Pt reports doesn't feel constipated, but doesn't remember when LBM- says it was lately.  Denies pain and reports breathing is good.   Review of Systems  Constitutional: Negative for chills and fever.  HENT: Negative for hearing loss.   Eyes: Negative for blurred vision and double vision.  Respiratory: Positive for cough, sputum production and shortness of breath.   Cardiovascular:  Positive for leg swelling. Negative for chest pain and palpitations.  Gastrointestinal: Positive for constipation. Negative for heartburn, nausea and vomiting.  Genitourinary: Negative for dysuria, flank pain and hematuria.  Musculoskeletal: Positive for joint pain and myalgias.  Skin: Negative for rash.  Neurological: Negative.   Endo/Heme/Allergies: Negative.   Psychiatric/Behavioral: Negative.   All other systems reviewed and are negative.  Past Medical History:  Diagnosis Date  . Acute on chronic respiratory failure with hypoxia (HCC)   . Autism   . Autism   . COVID-19 virus infection   . Pneumonia 02/2015  . Pneumonia due to COVID-19 virus   . Postinflammatory pulmonary fibrosis (HCC)   . Sickle cell trait (H[facility])    Past Surgical History:  Procedure Laterality Date  . NO PAST SURGERIES     Family History  Problem Relation Age of Onset  . Heart attack Mother    Social History:  reports that he has never smoked. He has never used smokeless tobacco. He reports that he does not drink alcohol or use drugs. Allergies:  Allergies  Allergen Reactions  . Peanuts [Peanut Oil] Anaphylaxis   Medications Prior to Admission  Medication Sig Dispense Refill  . clonazePAM (KLONOPIN) 0.5 MG tablet Take 1 tablet (0.5 mg total) by mouth at bedtime. 30 tablet 0  . ferrous sulfate 325 (65 FE) MG tablet Take 1 tablet (325 mg total) by mouth 2 (two) times daily with a meal. 60 tablet 3  . OLANZapine zydis (ZYPREXA) 5 MG disintegrating tablet Take 1 tablet (5 mg total) by mouth 2 (two) times daily. 60 tablet 1  . pantoprazole (PROTONIX) 40 MG tablet TAKE 1 TABLET BY MOUTH EVERY DAY 30 tablet 2  . predniSONE (DELTASONE) 5 MG tablet 5 mg   tablet daily 3 days then one half tablet daily 3 days. 10 tablet 0  . vitamin C (VITAMIN C) 250 MG tablet Take 1 tablet (250 mg total) by mouth 2 (two) times daily with a meal. 60 tablet 1    Drug Regimen Review Drug regimen was reviewed and remains  appropriate with no significant issues identified  Home: Patient lives with his sister.  Requires supervision for safety.  1 level apartment     Functional History: Supervision prior to admission    Functional Status:  Mobility: Min mod assist ambulation 18 feet          ADL: Moderate assist overall for ADLs needing some encouragement.    Cognition: Patient is autistic      Physical Exam: 128/70 pulse 80 temperature 98 respirations 18 oxygen saturation 92%  Physical Exam  Nursing note and vitals reviewed. Constitutional: He appears well-developed and well-nourished. No distress.  Awake, alert, appropriate, poor eye contact- kept looking exactly where I wasn't; smiling, sweet; denies pain and SOB, NAD lying in bed; on L side  HENT:  Head: Normocephalic and atraumatic.  Nose: Nose normal.  Mouth/Throat: Oropharynx is clear and moist. No oropharyngeal exudate.  Trach in place; capped; no significant secretions  Eyes: Pupils are equal, round, and reactive to light. EOM are normal. Right eye exhibits no discharge. Left eye exhibits no discharge.  Neck: No tracheal deviation present.  Tracheostomy tube in place.  Cardiovascular: Normal rate, regular rhythm and normal heart sounds.  RRR, no M/R/G  Respiratory: Effort normal. No stridor. No respiratory distress.  A little coarse but good air movement; no W/R/R Trach capped A dry cough intermittently  GI:  Soft, NT, ND; (+) hypoactive BS; protuberant  Musculoskeletal:     Cervical back: Normal range of motion and neck supple.     Comments: No joint swelling  UEs- 5-/5 B/L- deltoids, biceps, triceps, WE, grip and finger abduction  LEs- HF 3+/5 B/L; otherwise 4+/5 in LEs in KE/KF/DF/PF B/L  Neurological: No cranial nerve deficit.  Patient is alert makes good eye contact with examiner.  Patient is autistic which did limit overall exam.  He does provide his name and follow simple commands.  Intact sensation in all 4  extremities to light touch x4   Skin: Skin is warm and dry.  No bruising or skin issues seen  Psychiatric:  Smiling; concrete          Medical Problem List and Plan: 1.  Debility secondary to chronic respiratory failure pulmonary fibrosis complicated by COVID-19 with pneumonia.  Continue CPAP.  Patient with chronic O2 2 L prior to admission.  -patient may  Shower when trach capped or decannulated  -ELOS/Goals: 14-18 days; mod I- supervision  2.  Antithrombotics: -DVT/anticoagulation: Subcutaneous heparin.  Check vascular study  -antiplatelet therapy: N/A 3. Pain Management: Tylenol as needed 4. Mood: Celexa 10 mg daily, clonazepam 0.5 mg nightly, melatonin 3 mg nightly  -antipsychotic agents: N/A 5. Neuropsych: This patient is not capable of making decisions on his own behalf. 6. Skin/Wound Care: Routine skin checks 7. Fluids/Electrolytes/Nutrition: Routine in and outs with follow-up chemistries 8.  Tracheostomy.  Currently with a #6 cuffless capped trach.  Plan decannulation.Plan to decannulate 03/05/2019 9.  Chronic anemia with sickle cell trait.  Continue iron supplement 10.  Hypertension.  Lasix 20 mg daily.  Monitor with increased mobility 11. Possible constipation- will need to monitor- pt said doing well; but will monitor just in case.  12. Pulmonary fibrosis-   was on 2L at al times at home.  13. Previous ARDS/Sepsis/shock- has recovered, and treated for MRSA- finished Vanc 14. Autism with ADHD- will monitor for chronic condition if needs specific way to approach pt due to his Autism- many autistic patients cannot handle loud sounds, people in personal space, etc.     Daniel J Angiulli, PA-C 03/01/2019   I have personally performed a face to face diagnostic evaluation of this patient and formulated the key components of the plan.  Additionally, I have personally reviewed laboratory data, imaging studies, as well as relevant notes and concur with the physician assistant's  documentation above.   The patient's status has not changed from the original H&P.  Any changes in documentation from the acute care chart have been noted above.    

## 2019-03-02 NOTE — Progress Notes (Signed)
This nurse attempted to give scheduled Heparin. Pt insisted that he not get the shot. Attempt will be made once the pts sister arrives and the pt is more calm.

## 2019-03-02 NOTE — Care Management (Signed)
Inpatient Rehabilitation Center Individual Statement of Services  Patient Name:  Juan Cameron  Date:  03/02/2019  Welcome to the Inpatient Rehabilitation Center.  Our goal is to provide you with an individualized program based on your diagnosis and situation, designed to meet your specific needs.  With this comprehensive rehabilitation program, you will be expected to participate in at least 3 hours of rehabilitation therapies Monday-Friday, with modified therapy programming on the weekends.  Your rehabilitation program will include the following services:  Physical Therapy (PT), Occupational Therapy (OT), Speech Therapy (ST), 24 hour per day rehabilitation nursing, Therapeutic Recreaction (TR), Neuropsychology, Case Management (Social Worker), Rehabilitation Medicine, Nutrition Services and Pharmacy Services  Weekly team conferences will be held on Wednesdays  to discuss your progress.  Your Social Automotive engineer will talk with you frequently to get your input and to update you on team discussions.  Team conferences with you and your family in attendance may also be held.  Expected length of stay: 2-2.5 weeks Overall anticipated outcome: Supervision overall  Depending on your progress and recovery, your program may change. Your Social Automotive engineer will coordinate services and will keep you informed of any changes. Your Social Worker's/Care Manager's name and contact numbers are listed  below.The following services may also be recommended but are not provided by the Inpatient Rehabilitation Center:    Home Health Rehabiltiation Services  Outpatient Rehabilitation Services   Arrangements will be made to provide these services after discharge if needed.  Arrangements include referral to agencies that provide these services.  Your insurance has been verified to be:  Medicare A+B/Medicaid Your primary doctor is:  Valaria Good, MD  Pertinent information will be shared with your  doctor and your insurance company.  Social Worker:  [place], [place] [phone] or [phone]  Care Manager: Chana Bode, RN [phone] or [phone]  Information discussed with and copy given to patient by: Pamelia Hoit, 03/02/2019, 2:07 PM

## 2019-03-02 NOTE — Progress Notes (Signed)
[facility] PHYSICAL MEDICINE & REHABILITATION PROGRESS NOTE  Subjective/Complaints: Patient seen sitting up in bed this morning.  Overnight he refused his heparin injection and was noted to be coughing after drinking thin liquids.  ROS: Unable to obtain due to behavior  Objective: Vital Signs: Blood pressure 124/88, pulse (!) 109, temperature 98.4 F (36.9 C), temperature source Oral, resp. rate 20, height 5\' 9"  (1.753 m), weight 95.7 kg, SpO2 100 %. No results found. Recent Labs    03/01/19 1705 03/02/19 0520  WBC 11.8* 11.2*  HGB 11.9* 11.3*  HCT 36.7* 34.4*  PLT 376 356   Recent Labs    03/01/19 1705 03/02/19 0520  NA  --  138  K  --  3.9  CL  --  100  CO2  --  28  GLUCOSE  --  103*  BUN  --  11  CREATININE 1.08 1.13  CALCIUM  --  9.4    Physical Exam: BP 124/88 (BP Location: Right Arm)   Pulse (!) 109   Temp 98.4 F (36.9 C) (Oral)   Resp 20   Ht 5\' 9"  (1.753 m)   Wt 95.7 kg   SpO2 100%   BMI 31.14 kg/m  Constitutional: No distress . Vital signs reviewed. HENT: Normocephalic.  Atraumatic.  Trach capped Eyes: EOMI. No discharge. Cardiovascular: No JVD. Respiratory: Normal effort.  No stridor. GI: Non-distended. Skin: Warm and dry.  Intact. Psych: Normal mood.  Normal behavior. Musc: Appears agitated Neuro: Alert Motor: Refusing to follow commands, but spontaneously moving all extremities  Assessment/Plan: 1. Functional deficits secondary to debility secondary to Covid pneumonia with history of autism/ADHD which require 3+ hours per day of interdisciplinary therapy in a comprehensive inpatient rehab setting.  Physiatrist is providing close team supervision and 24 hour management of active medical problems listed below.  Physiatrist and rehab team continue to assess barriers to discharge/monitor patient progress toward functional and medical goals  Care Tool:  Bathing              Bathing assist       Upper Body Dressing/Undressing Upper  body dressing   What is the patient wearing?: Hospital gown only    Upper body assist      Lower Body Dressing/Undressing Lower body dressing            Lower body assist       Toileting Toileting    Toileting assist       Transfers Chair/bed transfer  Transfers assist           Locomotion Ambulation   Ambulation assist              Walk 10 feet activity   Assist           Walk 50 feet activity   Assist           Walk 150 feet activity   Assist           Walk 10 feet on uneven surface  activity   Assist           Wheelchair     Assist               Wheelchair 50 feet with 2 turns activity    Assist            Wheelchair 150 feet activity     Assist            Medical Problem List and Plan:  1. Debility secondary to chronic respiratory failure pulmonary fibrosis complicated by COVID-19 with pneumonia with history of autism/ADHD. Continue CPAP.   Begin CIR evaluations 2. Antithrombotics:   -DVT/anticoagulation: Subcutaneous heparin.    Vascular Dopplers pending for DVT  -antiplatelet therapy: N/A  3. Pain Management: Tylenol as needed  4. Mood: Celexa 10 mg daily, clonazepam 0.5 mg nightly  -antipsychotic agents: N/A  5. Neuropsych: This patient is not capable of making decisions on his own behalf.  6. Skin/Wound Care: Routine skin checks  7. Fluids/Electrolytes/Nutrition: Routine in and outs 8. 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 Plan decannulation.  Plan to decannulate 03/05/2019  9. Chronic anemia with sickle cell trait. Continue iron supplement   Hemoglobin 11.3 on 2/19  Continue to monitor 10. Hypertension. Lasix 20 mg daily.   Monitor with increased mobility 11. Possible constipation  Adjust bowel meds as necessary 12. Pulmonary fibrosis- was on 2L supplemental oxygen at al times at home.  13. Previous ARDS/Sepsis/shock- has recovered, and treated for  MRSA- finished Vanc  14.  Autism with ADHD- will monitor for chronic condition 15.  Sleep disturbance  Melatonin ordered 16.  Leukocytosis   WBCs 11.2 on 2/19, continue to monitor  Afebrile  LOS: 1 days A FACE TO FACE EVALUATION WAS PERFORMED  Juan Cameron Juan Cameron 03/02/2019, 8:03 AM

## 2019-03-02 NOTE — Evaluation (Signed)
Speech Language Pathology Assessment and Plan  Patient Details  Name: Juan Cameron MRN: [id number] Date of Birth: 1975-07-04  Evaluation Only   Today's Date: 03/02/2019 SLP Individual Time: 0801-0900 SLP Individual Time Calculation (min): 59 min   Problem List:  Patient Active Problem List   Diagnosis Date Noted  . Status post tracheostomy ([place])   . Sleep disturbance   . Supplemental oxygen dependent   . Acute on chronic respiratory failure with hypoxia ([place])   . COVID-19 virus infection   . Pneumonia due to COVID-19 virus   . Postinflammatory pulmonary fibrosis ([place])   . Autism   . Dysphagia   . Acute blood loss anemia   . Anemia of chronic disease   . Respiratory failure ([place]) 06/10/2015  . Acute on chronic respiratory failure with hypoxia ([place])   . Chronic diastolic CHF (congestive heart failure) ([place])   . Sinus tachycardia   . Acute renal failure ([place])   . Sickle cell trait ([employer])   . Essential hypertension   . SOB (shortness of breath)   . Pneumonia   . Acute pulmonary edema ([facility])   . Hypoxemia   . Sepsis ([place])   . Cough   . Right knee pain   . Cognitive deficits   . Tachycardia   . Debility 05/06/2015  . Encephalopathy 05/06/2015  . 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 Ventilator dependence ([place]) 04/08/2015  . Pressure ulcer 04/05/2015  . Acute respiratory failure ([place])   . Fever 03/21/2015  . Acute renal insufficiency 03/21/2015  . Obesity 03/21/2015  . ARDS (adult respiratory distress syndrome) ([place])   . HCAP (healthcare-associated pneumonia) 03/11/2015  . Leukocytosis 03/11/2015  . Acute hyperglycemia 03/11/2015  . Left ventricular diastolic dysfunction, NYHA class 2 03/11/2015  . Thrombocytopenia ([place]) 03/11/2015  . Acute lung injury 03/10/2015  . Acute respiratory failure with hypoxia ([place]) 02/28/2015  . CAP (community acquired pneumonia) 02/28/2015  . Hypokalemia 02/28/2015  . Autism 02/28/2015   Past Medical History:  Past Medical  History:  Diagnosis Date  . Acute on chronic respiratory failure with hypoxia ([place])   . Autism   . Autism   . COVID-19 virus infection   . Pneumonia 02/2015  . Pneumonia due to COVID-19 virus   . Postinflammatory pulmonary fibrosis ([place])   . Sickle cell trait [facility])    Past Surgical History:  Past Surgical History:  Procedure Laterality Date  . NO PAST SURGERIES      Assessment / Plan / Recommendation Clinical Impression Juan Cameron is a 44 year old right-handed African-American male with history significant for autism with element of attention deficit hyperactivity disorder as well as pulmonary fibrosis with 2 L oxygen at home, hypertension, hyperlipidemia, chronic anemia and sickle cell trait who lives with his sister in a 1 level [address] 2 steps to entry.  Required supervision prior to admission for safety and attends adult daycare   Patient well-known to rehab services from admission 06/10/2015 to 06/24/2015 for debilitation secondary to chronic respiratory failure.  [facility] January 07, 2019 with increasing shortness of breath found to have COVID-19 pneumonia as well as multifocal bacterial pneumonia.  Patient was treated with dexamethasone and tracheal aspirate positive for MRSA completing 14-day course of vancomycin.  Patient's hospital course complicated by development of sepsis with septic shock ARDS.  Hospital course ventilatory support with tracheostomy performed complicated by some tracheal stoma bleeding requiring embolization..  [facility].  He currently  has a #6 cuffless Trach and has been capped for greater than 72 hours and documentation states ready for decannulation.  Maintained on subcutaneous heparin for DVT prophylaxis.  Initially with nasogastric tube for nutritional support diet has been advanced to regular.  Patient was admitted for a comprehensive rehab program on 2/42/21.  Cognitive  linguistic evaluation and bedside swallow evaluation completed on 03/02/19. This pt is known to this Probation officer from previous admission. Of note, nursing reports that pt coughed when consuming orange juice earlier in the morning. Pt currently has a #6 cuffless trach that has been capped for more than 72 hours with decannulation planned. Pt was tolerating well throughout session. Pt's vocal quality was stronger and doesn't appear affected by prolonged intubation. During this evaluation pt had dry sounding cough throughout session that didn't increase with consumption of POs. Pt consumed challenging consecutive straw sips of thin water with no overt s/s of aspiration and no decline in vitals. He consumed additional sips via straw of Coke with large consecutive bolus sizes with no overt s/s of aspiration. Pt also consumed regular textures without overt s/s of aspiration or dysphagia. At this time, current diet appears appropriate with decrease risk of aspiration when following general aspiration precautions.   Cognitive linguistic evaluation concluded that pt's cognitive function is at baseline. Pt's [name]) via Facetime with pt during this evaluation and she further reports that he is "back to himself" from a cognitive standpoint.   At this time pt doesn't need skilled ST. SLP provided education to interdisciplinary team on ways to increase pt's ability to understand and also Juan Cameron wants pt to [facility] her if he refuses medication, CPAP or therapy. This Engineer, materials and posted signs in pt's room.     Skilled Therapeutic Interventions          N/A  SLP Assessment  Patient does not need any further Speech Lanaguage Pathology Services    Recommendations  Patient may use Passy-Muir Speech Valve: Lurline Idol is currently capped and has been for over 72 hours) SLP Diet Recommendations: 42 appropriate regular solids;Thin Liquid Administration via: Cup;Straw Medication Administration:  Whole meds with liquid Supervision: Patient able to self feed Compensations: Minimize environmental distractions;Slow rate;Small sips/bites Postural Changes and/or Swallow Maneuvers: Seated upright 90 degrees Oral Care Recommendations: Oral care BID Patient destination: Home Follow up Recommendations: None Equipment Recommended: None recommended by SLP           Pain Pain Assessment Pain Scale: 0-10 Pain Score: 0-No pain  Prior Functioning Cognitive/Linguistic Baseline: Baseline deficits Baseline deficit details: baseline deficits chronic d/t autism Type of Home: House  Lives With: Family Available Help at Discharge: Family;Available 24 hours/day Vocation: Unemployed  SLP Evaluation Cognition Overall Cognitive Status: History of cognitive impairments - at baseline Arousal/Alertness: Awake/alert Orientation Level: Oriented to person;Oriented to place;Oriented to situation  Comprehension Auditory Comprehension Overall Auditory Comprehension: Appears within functional limits for tasks assessed Expression Expression Primary Mode of Expression: Verbal Verbal Expression Overall Verbal Expression: Appears within functional limits for tasks assessed Written Expression Dominant Hand: Right Oral Motor Oral Motor/Sensory Function Overall Oral Motor/Sensory Function: Within functional limits Motor Speech Overall Motor Speech: Appears within functional limits for tasks assessed Motor Planning: Witnin functional limits Motor Speech Errors: Not applicable      Bedside Swallowing Assessment General Date of Onset: 03/02/19 Previous Swallow Assessment: 2/15 - [facility] Diet Prior to this Study: Regular;Thin liquids Temperature Spikes Noted: No Respiratory Status: Trach(capped and on room air) Lurline Idol  Size and Type: #6;Uncuffed History of Recent Intubation: Yes(uncertain when pt received trach) Behavior/Cognition: Alert;Cooperative;Pleasant mood Oral Cavity -  Dentition: Adequate natural dentition Self-Feeding Abilities: Able to feed self Patient Positioning: Upright in bed Volitional Cough: Strong Volitional Swallow: Unable to elicit  Oral Care Assessment   Ice Chips Ice chips: Not tested Thin Liquid Thin Liquid: Within functional limits Presentation: Self Fed;Straw;Cup Nectar Thick Nectar Thick Liquid: Not tested Honey Thick Honey Thick Liquid: Not tested Puree Puree: Within functional limits Presentation: Self Fed;Spoon Solid Solid: Within functional limits Presentation: Self Fed BSE Assessment Risk for Aspiration Impact on safety and function: No limitations Other Related Risk Factors: History of dysphagia;Tracheostomy;Cognitive impairment;Deconditioning  Short Term Goals: No short term goals set  Refer to Care Plan for Long Term Goals  Recommendations for other services: None   Discharge Criteria: Patient will be discharged from SLP if patient refuses treatment 3 consecutive times without medical reason, if treatment goals not met, if there is a change in medical status, if patient makes no progress towards goals or if patient is discharged from hospital.  The above assessment, treatment plan, treatment alternatives and goals were discussed and mutually agreed upon: by patient and by family  Jennifer Holland 03/02/2019, 12:35 PM

## 2019-03-02 NOTE — Progress Notes (Signed)
Bilateral lower extremity venous duplex has been completed. Preliminary results can be found in CV Proc through chart review.   03/02/19 3:10 PM Olen Cordial RVT

## 2019-03-02 NOTE — Progress Notes (Signed)
Inpatient Rehabilitation  Patient information reviewed and entered into eRehab system by Shakeema Lippman M. Hayk Divis, M.A., CCC/SLP, PPS Coordinator.  Information including medical coding, functional ability and quality indicators will be reviewed and updated through discharge.    

## 2019-03-02 NOTE — Progress Notes (Signed)
Pt sitting up straight in bed, drinking orange juice. After drinking a sip pt started coughing after drinking orange juice. Pt was drinking with out straw. Speech therapy will be informed for possible evaluation.

## 2019-03-02 NOTE — Progress Notes (Signed)
Patient noted with yellow MEWS score of 2. Patient pulse is elevated in the 120s. Patient is stable and shows no distress. Patient voices no pain and orientation is WNL. Staff will continue to monitor and assess.

## 2019-03-03 ENCOUNTER — Inpatient Hospital Stay (HOSPITAL_COMMUNITY): Payer: Medicare Other | Admitting: Physical Therapy

## 2019-03-03 DIAGNOSIS — D638 Anemia in other chronic diseases classified elsewhere: Secondary | ICD-10-CM

## 2019-03-03 DIAGNOSIS — R000 Tachycardia, unspecified: Secondary | ICD-10-CM

## 2019-03-03 MED ORDER — MUPIROCIN 2 % EX OINT
1.0000 "application " | TOPICAL_OINTMENT | Freq: Two times a day (BID) | CUTANEOUS | Status: AC
  Administered 2019-03-03 – 2019-03-07 (×10): 1 via NASAL
  Filled 2019-03-03: qty 22

## 2019-03-03 MED ORDER — CHLORHEXIDINE GLUCONATE CLOTH 2 % EX PADS
6.0000 | MEDICATED_PAD | Freq: Every day | CUTANEOUS | Status: DC
  Administered 2019-03-03: 6 via TOPICAL

## 2019-03-03 MED ORDER — CARVEDILOL 3.125 MG PO TABS
3.1250 mg | ORAL_TABLET | Freq: Two times a day (BID) | ORAL | Status: DC
  Administered 2019-03-03 – 2019-03-09 (×13): 3.125 mg via ORAL
  Filled 2019-03-03 (×13): qty 1

## 2019-03-03 NOTE — Progress Notes (Signed)
[facility] PHYSICAL MEDICINE & REHABILITATION PROGRESS NOTE  Subjective/Complaints: Patient seen sitting up in bed this morning.  Per nursing, tachycardic overnight.  Patient appears to have air leak when attempting to phonate, but will not let me evaluate.  ROS: Unable to obtain due to behavior  Objective: Vital Signs: Blood pressure 114/89, pulse (!) 109, temperature 97.6 F (36.4 C), resp. rate 16, height 5\' 9"  (1.753 m), weight 95.7 kg, SpO2 98 %. VAS LOWER EXTREMITY VENOUS (DVT)  Result Date: 03/02/2019  Lower Venous DVTStudy Indications: Swelling.  Risk Factors: None identified. Limitations: Body habitus, poor ultrasound/tissue interface and patient positioning, poor patient cooperation. Comparison Study: No prior studies. Performing Technologist: 03/04/2019 RVT  Examination Guidelines: A complete evaluation includes B-mode imaging, spectral Doppler, color Doppler, and power Doppler as needed of all accessible portions of each vessel. Bilateral testing is considered an integral part of a complete examination. Limited examinations for reoccurring indications may be performed as noted. The reflux portion of the exam is performed with the patient in reverse Trendelenburg.  +---------+---------------+---------+-----------+----------+--------------+ RIGHT    CompressibilityPhasicitySpontaneityPropertiesThrombus Aging +---------+---------------+---------+-----------+----------+--------------+ CFV      Full           Yes      Yes                                 +---------+---------------+---------+-----------+----------+--------------+ SFJ      Full                                                        +---------+---------------+---------+-----------+----------+--------------+ FV Prox  Full                                                        +---------+---------------+---------+-----------+----------+--------------+ FV Mid   Full                                                         +---------+---------------+---------+-----------+----------+--------------+ FV DistalFull                                                        +---------+---------------+---------+-----------+----------+--------------+ PFV      Full                                                        +---------+---------------+---------+-----------+----------+--------------+ POP      Full           Yes      Yes                                 +---------+---------------+---------+-----------+----------+--------------+  PTV      Full                                                        +---------+---------------+---------+-----------+----------+--------------+ PERO     Full                                                        +---------+---------------+---------+-----------+----------+--------------+   +---------+---------------+---------+-----------+----------+--------------+ LEFT     CompressibilityPhasicitySpontaneityPropertiesThrombus Aging +---------+---------------+---------+-----------+----------+--------------+ CFV      Full           Yes      Yes                                 +---------+---------------+---------+-----------+----------+--------------+ SFJ      Full                                                        +---------+---------------+---------+-----------+----------+--------------+ FV Prox  Full                                                        +---------+---------------+---------+-----------+----------+--------------+ FV Mid   Full                                                        +---------+---------------+---------+-----------+----------+--------------+ FV DistalFull                                                        +---------+---------------+---------+-----------+----------+--------------+ PFV      Full                                                         +---------+---------------+---------+-----------+----------+--------------+ POP      Full           Yes      Yes                                 +---------+---------------+---------+-----------+----------+--------------+ PTV      Full                                                        +---------+---------------+---------+-----------+----------+--------------+  PERO     Full                                                        +---------+---------------+---------+-----------+----------+--------------+     Summary: RIGHT: - There is no evidence of deep vein thrombosis in the lower extremity.  - No cystic structure found in the popliteal fossa.  LEFT: - There is no evidence of deep vein thrombosis in the lower extremity.  - No cystic structure found in the popliteal fossa.  *See table(s) above for measurements and observations. Electronically signed by Waverly Ferrari MD on 03/02/2019 at 4:59:38 PM.    Final    Recent Labs    03/01/19 1705 03/02/19 0520  WBC 11.8* 11.2*  HGB 11.9* 11.3*  HCT 36.7* 34.4*  PLT 376 356   Recent Labs    03/01/19 1705 03/02/19 0520  NA  --  138  K  --  3.9  CL  --  100  CO2  --  28  GLUCOSE  --  103*  BUN  --  11  CREATININE 1.08 1.13  CALCIUM  --  9.4    Physical Exam: BP 114/89 (BP Location: Right Arm)   Pulse (!) 109   Temp 97.6 F (36.4 C)   Resp 16   Ht 5\' 9"  (1.753 m)   Wt 95.7 kg   SpO2 98%   BMI 31.14 kg/m  Constitutional: No distress . Vital signs reviewed. HENT: Normocephalic.  Atraumatic.  Trach capped, with?  Leak Eyes: EOMI. No discharge. Cardiovascular: No JVD.  + Tachycardia on monitor Respiratory: Normal effort.  No stridor. GI: Non-distended. Skin: Warm and dry.  Intact. Psych: Normal mood.  Normal behavior. Musc: Agitated. Neuro: Alert Motor: Refusing to follow commands, but spontaneously moving all extremities, unchanged  Assessment/Plan: 1. Functional deficits secondary to debility secondary to  Covid pneumonia with history of autism/ADHD which require 3+ hours per day of interdisciplinary therapy in a comprehensive inpatient rehab setting.  Physiatrist is providing close team supervision and 24 hour management of active medical problems listed below.  Physiatrist and rehab team continue to assess barriers to discharge/monitor patient progress toward functional and medical goals  Care Tool:  Bathing    Body parts bathed by patient: Right arm, Left arm, Chest, Abdomen, Front perineal area, Right upper leg, Left upper leg, Face   Body parts bathed by helper: Buttocks, Right lower leg, Left lower leg     Bathing assist Assist Level: Moderate Assistance - Patient 50 - 74%     Upper Body Dressing/Undressing Upper body dressing   What is the patient wearing?: Pull over shirt    Upper body assist Assist Level: Minimal Assistance - Patient > 75%    Lower Body Dressing/Undressing Lower body dressing      What is the patient wearing?: Pants     Lower body assist Assist for lower body dressing: Minimal Assistance - Patient > 75%     Toileting Toileting    Toileting assist Assist for toileting: Supervision/Verbal cueing     Transfers Chair/bed transfer  Transfers assist     Chair/bed transfer assist level: Contact Guard/Touching assist     Locomotion Ambulation   Ambulation assist      Assist level: Moderate Assistance - Patient 50 - 74% Assistive device: No Device Max  distance: 53ft   Walk 10 feet activity   Assist     Assist level: Moderate Assistance - Patient - 50 - 74% Assistive device: No Device   Walk 50 feet activity   Assist Walk 50 feet with 2 turns activity did not occur: Safety/medical concerns(fatigue, O2 sat, increased HR, decreased balance, bilateral LE weakness)         Walk 150 feet activity   Assist Walk 150 feet activity did not occur: Safety/medical concerns(fatigue, O2 sat, increased HR, decreased balance, bilateral  LE weakness)         Walk 10 feet on uneven surface  activity   Assist Walk 10 feet on uneven surfaces activity did not occur: Safety/medical concerns(fatigue, O2 sat, increased HR, decreased balance, bilateral LE weakness)         Wheelchair     Assist Will patient use wheelchair at discharge?: Yes Type of Wheelchair: Manual    Wheelchair assist level: Supervision/Verbal cueing Max wheelchair distance: 41ft    Wheelchair 50 feet with 2 turns activity    Assist        Assist Level: Maximal Assistance - Patient 25 - 49%   Wheelchair 150 feet activity     Assist     Assist Level: Maximal Assistance - Patient 25 - 49%      Medical Problem List and Plan:  1. Debility secondary to chronic respiratory failure pulmonary fibrosis complicated by EFEOF-12 with pneumonia with history of autism/ADHD. Continue CPAP.   Continue CIR 2. Antithrombotics:   -DVT/anticoagulation: Subcutaneous heparin.    Vascular Dopplers negative for DVT  -antiplatelet therapy: N/A  3. Pain Management: Tylenol as needed  4. Mood: Celexa 10 mg daily, clonazepam 0.5 mg nightly  -antipsychotic agents: N/A  5. Neuropsych: This patient is not capable of making decisions on his own behalf.  6. Skin/Wound Care: Routine skin checks  7. Fluids/Electrolytes/Nutrition: Routine in and outs 8. 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   Plan to decannulate 03/05/19 9. Chronic anemia with sickle cell trait. Continue iron supplement   Hemoglobin 11.3 on 2/19, ?trending down, labs ordered for Monday  Continue to monitor 10. Hypertension. Lasix 20 mg daily.   Controlled on 2/20  Monitor with increased mobility 11. Possible constipation  Adjust bowel meds as necessary 12. Pulmonary fibrosis-   was on 2L supplemental oxygen at al times at home.  13. Previous ARDS/Sepsis/shock- has recovered, and treated for MRSA- finished Vanc  14.  Autism with ADHD- will monitor for chronic  condition 15.  Sleep disturbance  Melatonin ordered  ?  Improving 16.  Leukocytosis   WBCs 11.2 on 2/19, labs ordered for Monday  Afebrile 17.  Tachycardia  Likely secondary to deconditioning  Coreg 3.125 started on 3/20  LOS: 2 days A FACE TO FACE EVALUATION WAS PERFORMED  Samra Pesch [name] 03/03/2019, 10:42 AM

## 2019-03-03 NOTE — Progress Notes (Signed)
Patient's heart rate in the 108-120's during night shift. No distress noted. MD made aware. Will monitor.

## 2019-03-03 NOTE — Progress Notes (Addendum)
RT Note: Pt has #6 capped trach on RA. Pt tolerating well at this time. Per notes pt able to wear home CPAP. CPAP is setup and pt places himself on/off per previous home use. RT will continue to monitor

## 2019-03-04 ENCOUNTER — Inpatient Hospital Stay (HOSPITAL_COMMUNITY): Payer: Medicare Other

## 2019-03-04 NOTE — Progress Notes (Signed)
Patient has a #6 cuffless shiley trach which is capped. Patient does not want to wear CPAP at this time. Patient wearing oxygen set at 2lpm with Sp02=96%. Patient does not appear to be in any respiratory distress at this time. Patient does not want to wear CPAP at this time.

## 2019-03-04 NOTE — Progress Notes (Signed)
[facility] PHYSICAL MEDICINE & REHABILITATION PROGRESS NOTE  Subjective/Complaints: Patient seen laying in bed this morning.  Overnight, noted to be refusing CPAP.  ROS: Unable to obtain due to behavior  Objective: Vital Signs: Blood pressure 118/76, pulse (!) 110, temperature 98.4 F (36.9 C), temperature source Oral, resp. rate 18, height 5\' 9"  (1.753 m), weight 95.7 kg, SpO2 97 %. VAS LOWER EXTREMITY VENOUS (DVT)  Result Date: 03/02/2019  Lower Venous DVTStudy Indications: Swelling.  Risk Factors: None identified. Limitations: Body habitus, poor ultrasound/tissue interface and patient positioning, poor patient cooperation. Comparison Study: No prior studies. Performing Technologist: 03/04/2019 RVT  Examination Guidelines: A complete evaluation includes B-mode imaging, spectral Doppler, color Doppler, and power Doppler as needed of all accessible portions of each vessel. Bilateral testing is considered an integral part of a complete examination. Limited examinations for reoccurring indications may be performed as noted. The reflux portion of the exam is performed with the patient in reverse Trendelenburg.  +---------+---------------+---------+-----------+----------+--------------+ RIGHT    CompressibilityPhasicitySpontaneityPropertiesThrombus Aging +---------+---------------+---------+-----------+----------+--------------+ CFV      Full           Yes      Yes                                 +---------+---------------+---------+-----------+----------+--------------+ SFJ      Full                                                        +---------+---------------+---------+-----------+----------+--------------+ FV Prox  Full                                                        +---------+---------------+---------+-----------+----------+--------------+ FV Mid   Full                                                         +---------+---------------+---------+-----------+----------+--------------+ FV DistalFull                                                        +---------+---------------+---------+-----------+----------+--------------+ PFV      Full                                                        +---------+---------------+---------+-----------+----------+--------------+ POP      Full           Yes      Yes                                 +---------+---------------+---------+-----------+----------+--------------+ PTV  Full                                                        +---------+---------------+---------+-----------+----------+--------------+ PERO     Full                                                        +---------+---------------+---------+-----------+----------+--------------+   +---------+---------------+---------+-----------+----------+--------------+ LEFT     CompressibilityPhasicitySpontaneityPropertiesThrombus Aging +---------+---------------+---------+-----------+----------+--------------+ CFV      Full           Yes      Yes                                 +---------+---------------+---------+-----------+----------+--------------+ SFJ      Full                                                        +---------+---------------+---------+-----------+----------+--------------+ FV Prox  Full                                                        +---------+---------------+---------+-----------+----------+--------------+ FV Mid   Full                                                        +---------+---------------+---------+-----------+----------+--------------+ FV DistalFull                                                        +---------+---------------+---------+-----------+----------+--------------+ PFV      Full                                                         +---------+---------------+---------+-----------+----------+--------------+ POP      Full           Yes      Yes                                 +---------+---------------+---------+-----------+----------+--------------+ PTV      Full                                                        +---------+---------------+---------+-----------+----------+--------------+  PERO     Full                                                        +---------+---------------+---------+-----------+----------+--------------+     Summary: RIGHT: - There is no evidence of deep vein thrombosis in the lower extremity.  - No cystic structure found in the popliteal fossa.  LEFT: - There is no evidence of deep vein thrombosis in the lower extremity.  - No cystic structure found in the popliteal fossa.  *See table(s) above for measurements and observations. Electronically signed by Waverly Ferrari MD on 03/02/2019 at 4:59:38 PM.    Final    Recent Labs    03/01/19 1705 03/02/19 0520  WBC 11.8* 11.2*  HGB 11.9* 11.3*  HCT 36.7* 34.4*  PLT 376 356   Recent Labs    03/01/19 1705 03/02/19 0520  NA  --  138  K  --  3.9  CL  --  100  CO2  --  28  GLUCOSE  --  103*  BUN  --  11  CREATININE 1.08 1.13  CALCIUM  --  9.4    Physical Exam: BP 118/76 (BP Location: Right Arm)   Pulse (!) 110   Temp 98.4 F (36.9 C) (Oral)   Resp 18   Ht 5\' 9"  (1.753 m)   Wt 95.7 kg   SpO2 97%   BMI 31.14 kg/m  Constitutional: No distress . Vital signs reviewed. HENT: Normocephalic.  Atraumatic.  Trach capped. Eyes: EOMI. No discharge. Cardiovascular: No JVD. Respiratory: Normal effort.  No stridor. GI: Non-distended. Skin: Warm and dry.  Intact. Psych: No engagement this morning. Musc: No edema in extremities.  No tenderness in extremities. Neuro: Alert Motor: Refusing to follow commands, but spontaneously moving all extremities, unchanged  Assessment/Plan: 1. Functional deficits secondary to debility  secondary to Covid pneumonia with history of autism/ADHD which require 3+ hours per day of interdisciplinary therapy in a comprehensive inpatient rehab setting.  Physiatrist is providing close team supervision and 24 hour management of active medical problems listed below.  Physiatrist and rehab team continue to assess barriers to discharge/monitor patient progress toward functional and medical goals  Care Tool:  Bathing    Body parts bathed by patient: Right arm, Left arm, Chest, Abdomen, Front perineal area, Right upper leg, Left upper leg, Face   Body parts bathed by helper: Buttocks, Right lower leg, Left lower leg     Bathing assist Assist Level: Moderate Assistance - Patient 50 - 74%     Upper Body Dressing/Undressing Upper body dressing   What is the patient wearing?: Pull over shirt    Upper body assist Assist Level: Minimal Assistance - Patient > 75%    Lower Body Dressing/Undressing Lower body dressing      What is the patient wearing?: Pants     Lower body assist Assist for lower body dressing: Minimal Assistance - Patient > 75%     Toileting Toileting    Toileting assist Assist for toileting: Supervision/Verbal cueing     Transfers Chair/bed transfer  Transfers assist     Chair/bed transfer assist level: Contact Guard/Touching assist     Locomotion Ambulation   Ambulation assist      Assist level: Moderate Assistance - Patient 50 - 74% Assistive device: No Device Max  distance: 32ft   Walk 10 feet activity   Assist     Assist level: Moderate Assistance - Patient - 50 - 74% Assistive device: No Device   Walk 50 feet activity   Assist Walk 50 feet with 2 turns activity did not occur: Safety/medical concerns(fatigue, O2 sat, increased HR, decreased balance, bilateral LE weakness)         Walk 150 feet activity   Assist Walk 150 feet activity did not occur: Safety/medical concerns(fatigue, O2 sat, increased HR, decreased  balance, bilateral LE weakness)         Walk 10 feet on uneven surface  activity   Assist Walk 10 feet on uneven surfaces activity did not occur: Safety/medical concerns(fatigue, O2 sat, increased HR, decreased balance, bilateral LE weakness)         Wheelchair     Assist Will patient use wheelchair at discharge?: Yes Type of Wheelchair: Manual    Wheelchair assist level: Supervision/Verbal cueing Max wheelchair distance: 88ft    Wheelchair 50 feet with 2 turns activity    Assist        Assist Level: Maximal Assistance - Patient 25 - 49%   Wheelchair 150 feet activity     Assist     Assist Level: Maximal Assistance - Patient 25 - 49%      Medical Problem List and Plan:  1. Debility secondary to chronic respiratory failure pulmonary fibrosis complicated by [id number] with pneumonia with history of autism/ADHD. Continue CPAP.   Continue CIR 2. Antithrombotics:   -DVT/anticoagulation: Subcutaneous heparin.    Vascular Dopplers negative for DVT  -antiplatelet therapy: N/A  3. Pain Management: Tylenol as needed  4. Mood: Celexa 10 mg daily, clonazepam 0.5 mg nightly  -antipsychotic agents: N/A  5. Neuropsych: This patient is not capable of making decisions on his own behalf.  6. Skin/Wound Care: Routine skin checks  7. Fluids/Electrolytes/Nutrition: Routine in and outs 8. 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   Plan to decannulate tomorrow 9. Chronic anemia with sickle cell trait. Continue iron supplement   Hemoglobin 11.3 on 2/19, ?trending down, labs ordered for tomorrow  Continue to monitor 10. Hypertension. Lasix 20 mg daily.   Controlled on 2/21  Monitor with increased mobility 11. Possible constipation  Adjust bowel meds as necessary 12. Pulmonary fibrosis-   was on 2L supplemental oxygen at al times at home.  13. Previous ARDS/Sepsis/shock- has recovered, and treated for MRSA- finished Vanc  14.  Autism with ADHD- will  monitor for chronic condition 15.  Sleep disturbance with history of OSA  Melatonin ordered  ?  Improving  Encourage CPAP after decannulation 16.  Leukocytosis   WBCs 11.2 on 2/19, labs ordered for tomorrow  Afebrile 17.  Tachycardia  Likely secondary to deconditioning  Coreg 3.125 started on 3/20  Remains elevated, but improving on 2/21  LOS: 3 days A FACE TO FACE EVALUATION WAS PERFORMED  Reymond Maynez [name] 03/04/2019, 9:03 AM

## 2019-03-04 NOTE — Progress Notes (Signed)
Physical Therapy Session Note  Patient Details  Name: Juan Cameron MRN: [id number] Date of Birth: [birth year]  Today's Date: 03/04/2019 PT Individual Time: 1000-1057 and 1400-1458 PT Individual Time Calculation (min): 57 min and 58 min  Short Term Goals: Week 1:  PT Short Term Goal 1 (Week 1): Pt will transfer stand<>pivot with LRAD CGA PT Short Term Goal 2 (Week 1): Pt will perform car transfer with LRAD CGA PT Short Term Goal 3 (Week 1): Pt will ambulate 4ft with LRAD min A  Skilled Therapeutic Interventions/Progress Updates:   Treatment Session 1: [id number] 57 min Received pt supine in bed, pt agreeable to therapy, and denied any pain during session. Pt with increased coughing throughout session today. Session focused on functional mobility/transfers, ambulation with RW, LE strength, dynamic standing balance/coordination, and improved activity tolerance. O2 sat 97% at rest supine in bed. Pt performed bed mobility with supervision with HOB elevated. Pt reported urge to use restroom and ambulated 66ft without AD min A. Pt voided while standing with CGA. Pt ambulated 45ft to sink and washed hands with supervision. Therapist donned shoes max A for time management purposes. Pt transported to dayroom in [facility] total assist for time management purposes. Pt declined Nustep but agreeable to playing cornhole. Pt stood without AD CGA/min A and worked on dynamic standing balance playing cornhole x 1 trial to 21 points. O2 sat 97% and HR 100bpm. Pt reported increased R knee discomfort with standing and requested to finish cornhole game seated in [place]. Pt ambulated 154ft with RW CGA. O2 sat 82% increasing to 95% and HR 107 bpm with 3 minute seated rest break. Therapist educated pt on importance of using RW upon discharge for increased stability due to pt not wanting to use RW during therapy; need to reinforce this. Pt transported back to room in [facility] total assist. Pt ambulated 75ft without AD min A back to bed.  Sit<>supine with supervision. Concluded session with pt supine in bed, needs within reach, and bed alarm on.   Treatment Session 2: 8469-6295 58 min Received pt supine in bed, pt agreeable to therapy, and denied any pain during session. Sister present at bedside. Session focused on functional mobility/transfers, ambulation, dancing, LE strength, dynamic standing balance/coordination, and improved activity tolerance. Pt performed bed mobility with supervision. Therapist donned shoes max A for time management purposes. Pt reported urge to urinate and amulated 30ft without AD min A. Pt stood to urinate with CGA. Pt ambulated 78ft without AD min A to sink and washed hands with supervision. Pt transported to dayroom in [facility] for time management purposes. Pt expressed wanting to dance. Therapist played Usher and pt stood without AD CGA and danced for approximately 1 minute. Dancing included marching in place, doing the "sprinkler" and the "snorkel", spinning therapist under arm, and therapist spinning pt under arm all with CGA. O2 81% HR 119bpm increasing to 94% with 3 minute seated rest break. Pt also danced to [employer]" for approximately 1.5 minutes. Pt stood without AD and performed bilateral UE reaching and marching in place while dancing with CGA. O2 sat 89% and HR 126 bpm increasing to 95% with 3 minute seated rest break. Pt unable to remain standing and dance throughout full songs, but once seated continued to dance in [facility]. Pt ambulated 43ft without AD min A. Pt required verbal cues for slow pace and step length. Pt performed LE strengthening on Nustep at workload 5 for 1 minute and 47 seconds, but spontaneously stopped. Pt  required encouragement to continue and therapist played [employer] [address] [employer] Theme Song for motivation. Standing with bilateral UE support on table pt played UNO with CGA. Pt remained standing for approximately 3 minutes but returned to sitting and requested to play  remainder of game seated due to fatigue. Pt transported back to room in [facility] total assist. Concluded session with pt sitting in WC, needs within reach, and chair pad alarm on. Updated safety plan to state pt can walk to bathroom with nursing using RW.  Therapy Documentation Precautions:  Precautions Precautions: Fall Precaution Comments: watch HR Restrictions Weight Bearing Restrictions: No   Therapy/Group: Individual Therapy Martin Majestic PT, DPT   03/04/2019, 7:32 AM

## 2019-03-04 NOTE — Plan of Care (Signed)
  Problem: Consults Goal: RH GENERAL PATIENT EDUCATION Description: See Patient Education module for education specifics. Outcome: Progressing Goal: Skin Care Protocol Initiated - if Braden Score 18 or less Description: If consults are not indicated, leave blank or document N/A Outcome: Progressing   Problem: RH BOWEL ELIMINATION Goal: RH STG MANAGE BOWEL WITH ASSISTANCE Description: STG Manage Bowel with min Assistance. Outcome: Progressing   Problem: RH SKIN INTEGRITY Goal: RH STG SKIN FREE OF INFECTION/BREAKDOWN Description: Pt will be free of skin breakdown and infection prior to DC with min asssit Outcome: Progressing   Problem: RH SAFETY Goal: RH STG ADHERE TO SAFETY PRECAUTIONS W/ASSISTANCE/DEVICE Description: STG Adhere to Safety Precautions With cues/reminders Assistance/Device. Outcome: Progressing   Problem: RH PAIN MANAGEMENT Goal: RH STG PAIN MANAGED AT OR BELOW PT'S PAIN GOAL Description: Less than 3  Outcome: Progressing   

## 2019-03-04 NOTE — Progress Notes (Signed)
Patient's sister wants to make sure he is wearing his cpap at night. Patient c/o discomfort on his eyes and throat while cpap is on. Notified respiratory to check on patient. Patient is on a continuous pulse oximeter with saturation at 95-97%. Patient is refusing cpap at this time.Will monitor.

## 2019-03-04 NOTE — Progress Notes (Signed)
Occupational Therapy Session Note  Patient Details  Name: Juan Cameron MRN: [id number] Date of Birth: 1975/07/01  Today's Date: 03/04/2019 OT Individual Time: 4098-1191 OT Individual Time Calculation (min): 60 min    Short Term Goals: Week 1:  OT Short Term Goal 1 (Week 1): Pt will complete bathing at supervision level with <2 cues for sequencing/thoroughness OT Short Term Goal 2 (Week 1): Pt will complete LB dressing to include socks/shoes with min assist OT Short Term Goal 3 (Week 1): Pt will complete toilet transfers with LRAD with min assist OT Short Term Goal 4 (Week 1): Pt will gather clothing at ambulatory level with CGA prior to bathing/dressing  Skilled Therapeutic Interventions/Progress Updates:    Pt received supine in bed with no c/o pain, agreeable to wash up at the sink as suggested by OT and then suggesting playing Uno as well. Pt tachy at rest- HR 115-120 bpm, SpO2 at 93% on RA. Pt completed bed mobility with mod I to EOB. Pt completed several steps to the w/c with close (S) and sat in the w/c at the sink, requiring cueing for initiation of oral hygiene. After transfer, pt remained at similar tachy levels- HR 120-125 bpm. Pt able to complete UB bathing and dressing with (S). Pt attempted to void urine in standing with close (S) but was unable. Ambulatory transfer into the bathroom with CGA. Pt completed LB bathing in standing with CGA. Pt with intermittent coughing throughout session. Pt was taken down to the therapy gym via w/c for time management. Pt completed Wii bowling sit <> stand with decrease in O2 to 89% after 3 consecutive bowls, requiring next 4 sets to be complete seated, and so on like this for entirety of game. Pt returned to room via 120 ft of functional mobility with CGA-(S) no AD. Pt HR at 135 bpm and returning to 117 bpm with extended rest break. Pt was left sitting up in the w/c on RA, with all needs met. Chair alarm set.   Therapy Documentation Precautions:   Precautions Precautions: Fall Precaution Comments: watch HR Restrictions Weight Bearing Restrictions: No   Therapy/Group: Individual Therapy  Curtis Sites 03/04/2019, 7:16 AM

## 2019-03-05 ENCOUNTER — Inpatient Hospital Stay (HOSPITAL_COMMUNITY): Payer: Medicare Other | Admitting: Occupational Therapy

## 2019-03-05 ENCOUNTER — Inpatient Hospital Stay (HOSPITAL_COMMUNITY): Payer: Medicare Other

## 2019-03-05 ENCOUNTER — Encounter (HOSPITAL_COMMUNITY): Payer: Self-pay | Admitting: Physical Medicine & Rehabilitation

## 2019-03-05 ENCOUNTER — Other Ambulatory Visit: Payer: Self-pay

## 2019-03-05 LAB — CBC WITH DIFFERENTIAL/PLATELET
Abs Immature Granulocytes: 0.27 10*3/uL — ABNORMAL HIGH (ref 0.00–0.07)
Basophils Absolute: 0.1 10*3/uL (ref 0.0–0.1)
Basophils Relative: 1 %
Eosinophils Absolute: 0.7 10*3/uL — ABNORMAL HIGH (ref 0.0–0.5)
Eosinophils Relative: 6 %
HCT: 35.8 % — ABNORMAL LOW (ref 39.0–52.0)
Hemoglobin: 11.9 g/dL — ABNORMAL LOW (ref 13.0–17.0)
Immature Granulocytes: 2 %
Lymphocytes Relative: 17 %
Lymphs Abs: 2 10*3/uL (ref 0.7–4.0)
MCH: 28 pg (ref 26.0–34.0)
MCHC: 33.2 g/dL (ref 30.0–36.0)
MCV: 84.2 fL (ref 80.0–100.0)
Monocytes Absolute: 1.2 10*3/uL — ABNORMAL HIGH (ref 0.1–1.0)
Monocytes Relative: 10 %
Neutro Abs: 7.4 10*3/uL (ref 1.7–7.7)
Neutrophils Relative %: 64 %
Platelets: 391 10*3/uL (ref 150–400)
RBC: 4.25 MIL/uL (ref 4.22–5.81)
RDW: 18.5 % — ABNORMAL HIGH (ref 11.5–15.5)
WBC: 11.7 10*3/uL — ABNORMAL HIGH (ref 4.0–10.5)
nRBC: 0.4 % — ABNORMAL HIGH (ref 0.0–0.2)

## 2019-03-05 LAB — GLUCOSE, CAPILLARY
Glucose-Capillary: 98 mg/dL (ref 70–99)
Glucose-Capillary: 81 mg/dL (ref 70–99)

## 2019-03-05 MED ORDER — CHLORHEXIDINE GLUCONATE CLOTH 2 % EX PADS
6.0000 | MEDICATED_PAD | Freq: Two times a day (BID) | CUTANEOUS | Status: DC
  Administered 2019-03-06 – 2019-03-09 (×2): 6 via TOPICAL

## 2019-03-05 NOTE — Plan of Care (Signed)
  Problem: Consults Goal: RH GENERAL PATIENT EDUCATION Description: See Patient Education module for education specifics. Outcome: Progressing   Problem: RH BOWEL ELIMINATION Goal: RH STG MANAGE BOWEL WITH ASSISTANCE Description: STG Manage Bowel with min Assistance. Outcome: Progressing   Problem: RH SKIN INTEGRITY Goal: RH STG SKIN FREE OF INFECTION/BREAKDOWN Description: Pt will be free of skin breakdown and infection prior to DC with min asssit Outcome: Progressing   Problem: RH SAFETY Goal: RH STG ADHERE TO SAFETY PRECAUTIONS W/ASSISTANCE/DEVICE Description: STG Adhere to Safety Precautions With cues/reminders Assistance/Device. Outcome: Progressing   Problem: RH PAIN MANAGEMENT Goal: RH STG PAIN MANAGED AT OR BELOW PT'S PAIN GOAL Description: Less than 3  Outcome: Progressing

## 2019-03-05 NOTE — Plan of Care (Signed)
  Problem: RH Balance Goal: LTG Patient will maintain dynamic standing balance (PT) Description: LTG:  Patient will maintain dynamic standing balance with assistance during mobility activities (PT) Flowsheets (Taken 03/05/2019 1222) LTG: Pt will maintain dynamic standing balance during mobility activities with:: (downgraded due to LE weakness, decreased balance, and attention/awareness) Supervision/Verbal cueing Note: downgraded due to LE weakness, decreased balance, and attention/awareness   Problem: Sit to Stand Goal: LTG:  Patient will perform sit to stand with assistance level (PT) Description: LTG:  Patient will perform sit to stand with assistance level (PT) Flowsheets (Taken 03/05/2019 1222) LTG: PT will perform sit to stand in preparation for functional mobility with assistance level: (downgraded due to LE weakness, decreased balance, and attention/awareness) Supervision/Verbal cueing Note: downgraded due to LE weakness, decreased balance, and attention/awareness   Problem: RH Bed to Chair Transfers Goal: LTG Patient will perform bed/chair transfers w/assist (PT) Description: LTG: Patient will perform bed to chair transfers with assistance (PT). Flowsheets (Taken 03/05/2019 1222) LTG: Pt will perform Bed to Chair Transfers with assistance level: (downgraded due to LE weakness, decreased balance, and attention/awareness) Supervision/Verbal cueing Note: downgraded due to LE weakness, decreased balance, and attention/awareness   Problem: RH Stairs Goal: LTG Patient will ambulate up and down stairs w/assist (PT) Description: LTG: Patient will ambulate up and down # of stairs with assistance (PT) Flowsheets (Taken 03/05/2019 1222) LTG: Pt will ambulate up/down stairs assist needed:: Supervision/Verbal cueing LTG: Pt will  ambulate up and down number of stairs: 1 step no rails with LRAD

## 2019-03-05 NOTE — Progress Notes (Signed)
Occupational Therapy Session Note  Patient Details  Name: Juan Cameron MRN: [id number] Date of Birth: 1975-02-07  Today's Date: 03/05/2019 OT Individual Time: 1000-1053 OT Individual Time Calculation (min): 53 min    Short Term Goals: Week 1:  OT Short Term Goal 1 (Week 1): Pt will complete bathing at supervision level with <2 cues for sequencing/thoroughness OT Short Term Goal 2 (Week 1): Pt will complete LB dressing to include socks/shoes with min assist OT Short Term Goal 3 (Week 1): Pt will complete toilet transfers with LRAD with min assist OT Short Term Goal 4 (Week 1): Pt will gather clothing at ambulatory level with CGA prior to bathing/dressing  Skilled Therapeutic Interventions/Progress Updates:    Treatment session with focus on activity tolerance and endurance.  Pt received upright in w/c already dressed prior to session and agreeable to therapy session.  Pt propelled w/c with BUE/BLE for endurance.  Coughed off cap from trach due to increased coughing, RN notified and cleaned and reapplied cap (RT notified).  Engaged in sit > stand to engage in Wii bowling activity with pt completing sit <> stand with CGA.  CGA during dynamic standing balance.  Pt stood for 4 rounds, then required extensive seated rest break, stood again for 2 rounds and then refused any further standing.  Pt reports pain in Lt knee - declined any pain intervention.  Pt also patting stomach intermittently, when asked if it hurt he nodded "yes".  Pt more flat this session, but would smile and laugh intermittently.  O2 sats dropped to 80% on room air post standing, but returned to 90% in <30 seconds and up to 94% in 1 minute.  HR maintained 110s-124 with activity.  Returned to room as above and transferred stand pivot CGA to bed.  Pt left with all needs in reach.  Therapy Documentation Precautions:  Precautions Precautions: Fall Precaution Comments: watch HR Restrictions Weight Bearing Restrictions:  No General:   Vital Signs: Therapy Vitals Pulse Rate: 96 Resp: 16 BP: (!) 127/93 Patient Position (if appropriate): Sitting Oxygen Therapy SpO2: 96 % O2 Device: Room Air Pain:  Pt reports pain in Lt knee and abdomen.  Pt refused any pain medicine, returned to bed at end of session.   Therapy/Group: Individual Therapy  Rosalio Loud 03/05/2019, 12:07 PM

## 2019-03-05 NOTE — Progress Notes (Addendum)
[place] PHYSICAL MEDICINE & REHABILITATION PROGRESS NOTE  Subjective/Complaints: Juan Cameron has been having increasing cough with sputum production. Seen in therapy gym and feels SOB. Desatting to 78% while sitting with Vicente Males, PT. Working on breathing techniques.  He wants to go home on Friday and sister is able to provide 24/7 support.   ROS: Unable to obtain due to behavior  Objective: Vital Signs: Blood pressure 115/80, pulse (!) 108, temperature 98.7 F (37.1 C), resp. rate 16, height 5\' 9"  (1.753 m), weight 95.7 kg, SpO2 95 %. No results found. No results for input(s): WBC, HGB, HCT, PLT in the last 72 hours. No results for input(s): NA, K, CL, CO2, GLUCOSE, BUN, CREATININE, CALCIUM in the last 72 hours.  Physical Exam: BP 115/80 (BP Location: Right Arm)   Pulse (!) 108   Temp 98.7 F (37.1 C)   Resp 16   Ht 5\' 9"  (1.753 m)   Wt 95.7 kg   SpO2 95%   BMI 31.14 kg/m  Constitutional: No distress . Vital signs reviewed. HENT: Normocephalic.  Atraumatic.  Trach capped. Eyes: EOMI. No discharge. Cardiovascular: No JVD. Respiratory: Increased breathing effort. Working on breathing techniques with therapy. +cough. Currently desatting to 78% with pulse ox on finger at rest.  GI: Non-distended. Skin: Warm and dry.  Intact. Psych: No engagement this morning. Musc: No edema in extremities.  No tenderness in extremities. Neuro: Alert Motor: Refusing to follow commands, but spontaneously moving all extremities, unchanged  Assessment/Plan: 1. Functional deficits secondary to debility secondary to Covid pneumonia with history of autism/ADHD which require 3+ hours per day of interdisciplinary therapy in a comprehensive inpatient rehab setting.  Physiatrist is providing close team supervision and 24 hour management of active medical problems listed below.  Physiatrist and rehab team continue to assess barriers to discharge/monitor patient progress toward functional and medical  goals  Care Tool:  Bathing    Body parts bathed by patient: Right arm, Left arm, Chest, Abdomen, Front perineal area, Right upper leg, Left upper leg, Face   Body parts bathed by helper: Buttocks, Right lower leg, Left lower leg     Bathing assist Assist Level: Moderate Assistance - Patient 50 - 74%     Upper Body Dressing/Undressing Upper body dressing   What is the patient wearing?: Pull over shirt    Upper body assist Assist Level: Minimal Assistance - Patient > 75%    Lower Body Dressing/Undressing Lower body dressing      What is the patient wearing?: Pants     Lower body assist Assist for lower body dressing: Minimal Assistance - Patient > 75%     Toileting Toileting    Toileting assist Assist for toileting: Contact Guard/Touching assist     Transfers Chair/bed transfer  Transfers assist     Chair/bed transfer assist level: Contact Guard/Touching assist     Locomotion Ambulation   Ambulation assist      Assist level: Contact Guard/Touching assist Assistive device: Walker-rolling Max distance: 182ft   Walk 10 feet activity   Assist     Assist level: Contact Guard/Touching assist Assistive device: Walker-rolling   Walk 50 feet activity   Assist Walk 50 feet with 2 turns activity did not occur: Safety/medical concerns(fatigue, O2 sat, increased HR, decreased balance, bilateral LE weakness)  Assist level: Contact Guard/Touching assist Assistive device: Walker-rolling    Walk 150 feet activity   Assist Walk 150 feet activity did not occur: Safety/medical concerns(fatigue, O2 sat, increased HR, decreased balance, bilateral  LE weakness)  Assist level: Contact Guard/Touching assist Assistive device: Walker-rolling    Walk 10 feet on uneven surface  activity   Assist Walk 10 feet on uneven surfaces activity did not occur: Safety/medical concerns(fatigue, O2 sat, increased HR, decreased balance, bilateral LE weakness)          Wheelchair     Assist Will patient use wheelchair at discharge?: Yes Type of Wheelchair: Manual    Wheelchair assist level: Supervision/Verbal cueing Max wheelchair distance: 88ft    Wheelchair 50 feet with 2 turns activity    Assist        Assist Level: Maximal Assistance - Patient 25 - 49%   Wheelchair 150 feet activity     Assist     Assist Level: Maximal Assistance - Patient 25 - 49%      Medical Problem List and Plan:  1. Debility secondary to chronic respiratory failure pulmonary fibrosis complicated by COVID-19 with pneumonia with history of autism/ADHD. Continue CPAP.   Continue CIR PT and OT 2. Antithrombotics:   -DVT/anticoagulation: Subcutaneous heparin.    Vascular Dopplers negative for DVT  -antiplatelet therapy: N/A  3. Pain Management: Tylenol as needed  4. Mood: Celexa 10 mg daily, clonazepam 0.5 mg nightly  -antipsychotic agents: N/A  5. Neuropsych: This patient is not capable of making decisions on his own behalf.  6. Skin/Wound Care: Routine skin checks  7. Fluids/Electrolytes/Nutrition: Routine in and outs 8. 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   Plan to decannulate today 9. Chronic anemia with sickle cell trait. Continue iron supplement   Hemoglobin 11.3 on 2/19, ?trending down, labs ordered for tomorrow  Continue to monitor 10. Hypertension. Lasix 20 mg daily.   Controlled on 2/21, 2/22  Monitor with increased mobility 11. Possible constipation  Adjust bowel meds as necessary 12. Pulmonary fibrosis-   was on 2L supplemental oxygen at al times at home.  13. Previous ARDS/Sepsis/shock- has recovered, and treated for MRSA- finished Vanc  14.  Autism with ADHD- will monitor for chronic condition 15.  Sleep disturbance with history of OSA  Melatonin ordered  ?  Improving  Encourage CPAP after decannulation 16.  Leukocytosis   WBCs 11.2 on 2/19, labs ordered for tomorrow  Afebrile 17.  Tachycardia  Likely  secondary to deconditioning  Coreg 3.125 started on 3/20  Remains elevated, but improving on 2/21 18. Cough with sputum, subjective SOB, desaturation to 78% at rest: will obtain CXR to r/o new pneumonia. Has been afebrile but with mild leukocytosis on last labs (2/19). Personally reviewed last CXR 2/4 which showed no consolidative process. Addendum: Personally reviewed CXR results which show improved airspace and patchy residual opacities as compared to prior. Discussed results with patient and his [name]. 19. Disposition: PT Tobi Bastos says from PT perspective patient could potentially be discharged on Friday as sister is able to provide 24/7 support.   LOS: 4 days A FACE TO FACE EVALUATION WAS PERFORMED  Juan Cameron P Oreta Soloway 03/05/2019, 9:15 AM

## 2019-03-05 NOTE — Progress Notes (Signed)
Physical Therapy Session Note  Patient Details  Name: Juan Cameron MRN: [id number] Date of Birth: 05/07/1975  Today's Date: 03/05/2019 PT Individual Time: 0800-0900 and 1300-1358 PT Individual Time Calculation (min): 60 min and 58 min  Short Term Goals: Week 1:  PT Short Term Goal 1 (Week 1): Pt will transfer stand<>pivot with LRAD CGA PT Short Term Goal 2 (Week 1): Pt will perform car transfer with LRAD CGA PT Short Term Goal 3 (Week 1): Pt will ambulate 51ft with LRAD min A  Skilled Therapeutic Interventions/Progress Updates:   Treatment Session 1: 0800-0900 60 min Received pt supine in bed, pt agreeable to therapy, and denied any pain during session. Pt with increased coughing and secretions throughout session. O2 sat 93% and HR 118 bpm supine in bed. Session focused on functional mobility/transfers, ambulation, curb/stair navigation, LE strength, balance/coordination, and improved endurance with activity. Pt performed bed mobility with supervision with HOB elevated. Pt doffed old shirt with supervision and donned clean one with set up assist while sitting EOB. Pt ambulated 72ft without AD CGA to sink. While standing at sink with CGA pt performed oral care and applied deodorant.  Pt ambulated 59ft to bathroom and performed toilet transfer CGA. Pt with small BM smear. Pt required min A to doff soiled pants and to don clean ones. Pt able to stand with CGA to pull pants over hips. Pt washed hands standing at sink with supervision. Pt performed WC mobility 170ft to therapy gym using bilateral LE and supervision. Pt navigated 1 curb with RW x 2 trials CGA. Pt required verbal cues for technique and RW safety. O2 sat 84% and HR 144 bpm increasing to 95% and HR 116 bpm with 4 minute seated rest break. Pt ambulated  40ft with RW CGA/supervision. Pt required verbal cues to focus on walking to avoid distractions. O2 sat 84% and HR 133 bpm increasing to 94% and 120 bpm with 3 minute seated rest break. Pt  reported fatigue and required multiple rest breaks throughout session. Therapist encouraged pt to participate in LE strengthening and endurance with sit<>stands however, pt declined. Pt transported back to room in [facility] total assist. Concluded session with pt sitting in WC, needs within reach, and chair pad alarm on. Therapist provided drink for pt.   Treatment Session 2: 1300-1358 58 min Received pt supine in bed, pt agreeable to therapy, and denied any pain during session. Session focused on functional mobility/transfers, ambulation, LE strength, balance/coordination, and improved endurance with activity. Pt performed bed mobility with supervision and HOB elevated. Pt transferred stand<>pivot without AD CGA to WC.  Pt performed ambulatory scavenger hunt searching for beanbags using RW and CGA for approximately 60ft. O2 sat 81% and HR 126bpm increasing to 94% and HR 114 bpm with 4 minute seated rest break. Pt able to pick up beanbag from floor with CGA. Pt performed standing horseshoe toss without AD CGA/supervision x 2 trials. O2 sat 88% increasing to 95% and HR 107 bpm with 3 minute seated rest break. Pt performed car transfer without AD CGA. Pt ambulated 21ft on uneven surfaces (ramp) with RW CGA. Pt motivated by playing music on phone throughout session. Pt transported back to room in [facility] total assist. Concluded session with pt supine in bed, needs within reach, and bed alarm on.   Therapy Documentation Precautions:  Precautions Precautions: Fall Precaution Comments: watch HR Restrictions Weight Bearing Restrictions: No   Therapy/Group: Individual Therapy Marlana Salvage Imagene Gurney PT, DPT   03/05/2019, 7:30  AM  

## 2019-03-05 NOTE — Plan of Care (Signed)
  Problem: RH Balance Goal: LTG Patient will maintain dynamic standing with ADLs (OT) Description: LTG:  Patient will maintain dynamic standing balance with assist during activities of daily living (OT)  Flowsheets (Taken 03/05/2019 1523) LTG: Pt will maintain dynamic standing balance during ADLs with: (downgraded) Supervision/Verbal cueing Note: Downgraded due to BLE weakness, balance, and decreased attention/awareness   Problem: Sit to Stand Goal: LTG:  Patient will perform sit to stand in prep for activites of daily living with assistance level (OT) Description: LTG:  Patient will perform sit to stand in prep for activites of daily living with assistance level (OT) Flowsheets (Taken 03/05/2019 1523) LTG: PT will perform sit to stand in prep for activites of daily living with assistance level: (Downgraded due to BLE weakness, balance, and decreased attention/awareness) Supervision/Verbal cueing Note: Downgraded due to BLE weakness, balance, and decreased attention/awareness   Problem: RH Dressing Goal: LTG Patient will perform upper body dressing (OT) Description: LTG Patient will perform upper body dressing with assist, with/without cues (OT). Flowsheets (Taken 03/05/2019 1523) LTG: Pt will perform upper body dressing with assistance level of: (Downgraded due to BLE weakness, balance, and decreased attention/awareness) Set up assist Note: Downgraded due to BLE weakness, balance, and decreased attention/awareness   Problem: RH Toileting Goal: LTG Patient will perform toileting task (3/3 steps) with assistance level (OT) Description: LTG: Patient will perform toileting task (3/3 steps) with assistance level (OT)  Flowsheets (Taken 03/05/2019 1523) LTG: Pt will perform toileting task (3/3 steps) with assistance level: (Downgraded due to BLE weakness, balance, and decreased attention/awareness) Supervision/Verbal cueing Note: Downgraded due to BLE weakness, balance, and decreased  attention/awareness   Problem: RH Toilet Transfers Goal: LTG Patient will perform toilet transfers w/assist (OT) Description: LTG: Patient will perform toilet transfers with assist, with/without cues using equipment (OT) Flowsheets (Taken 03/05/2019 1523) LTG: Pt will perform toilet transfers with assistance level of: (Downgraded due to BLE weakness, balance, and decreased attention/awareness) Supervision/Verbal cueing Note: Downgraded due to BLE weakness, balance, and decreased attention/awareness

## 2019-03-05 NOTE — Progress Notes (Signed)
Occupational Therapy Session Note  Patient Details  Name: Juan Cameron MRN: [id number] Date of Birth: 06-Nov-1975  Today's Date: 03/05/2019 OT Individual Time: 9767-3419 OT Individual Time Calculation (min): 28 min    Short Term Goals: Week 1:  OT Short Term Goal 1 (Week 1): Pt will complete bathing at supervision level with <2 cues for sequencing/thoroughness OT Short Term Goal 2 (Week 1): Pt will complete LB dressing to include socks/shoes with min assist OT Short Term Goal 3 (Week 1): Pt will complete toilet transfers with LRAD with min assist OT Short Term Goal 4 (Week 1): Pt will gather clothing at ambulatory level with CGA prior to bathing/dressing  Skilled Therapeutic Interventions/Progress Updates:    Upon entering the room, pt supine in bed and agreeable to OT intervention. This pt remembers therapist from hospital stay ~3 years ago and ambulates in room with CGA and without use of AD to show pictures and wrestling figurines. Pt's resting HR is 110 and increases to 125 bpm at end of session. Pt remains on RA and O2 saturation above 93% during this session. Pt ambulating 50' into hallway as well before returning back to room. Pt declines toileting needs and self care tasks at this time. NT arrives to check vitals. Pt returning to bed with supervision. Bed alarm activated and call bell within reach.   Therapy Documentation Precautions:  Precautions Precautions: Fall Precaution Comments: watch HR Restrictions Weight Bearing Restrictions: No General:   Vital Signs: Therapy Vitals Temp: (!) 97.4 F (36.3 C) Pulse Rate: (!) 105 Resp: 18 BP: 109/75 Patient Position (if appropriate): Lying Oxygen Therapy SpO2: 96 % O2 Device: Room Air ADL: ADL Grooming: Setup Where Assessed-Grooming: Sitting at sink Upper Body Bathing: Setup, Minimal cueing Where Assessed-Upper Body Bathing: Sitting at sink Lower Body Bathing: Moderate assistance Where Assessed-Lower Body Bathing:  Sitting at sink, Standing at sink Upper Body Dressing: Minimal cueing, Setup Where Assessed-Upper Body Dressing: Sitting at sink Lower Body Dressing: Moderate assistance Where Assessed-Lower Body Dressing: Standing at sink, Sitting at sink Toilet Transfer: Minimal assistance Toilet Transfer Method: Stand pivot   Therapy/Group: Individual Therapy  [name] 03/05/2019, 4:14 PM

## 2019-03-05 NOTE — Plan of Care (Signed)
  Problem: RH Wheelchair Mobility Goal: LTG Patient will propel w/c in controlled environment (PT) Description: LTG: Patient will propel wheelchair in controlled environment, # of feet with assist (PT) Outcome: Not Applicable Flowsheets (Taken 03/05/2019 1226) LTG: Pt will propel w/c in controlled environ  assist needed:: (D/C) -- Note: D/C Goal: LTG Patient will propel w/c in home environment (PT) Description: LTG: Patient will propel wheelchair in home environment, # of feet with assistance (PT). Outcome: Not Applicable Flowsheets (Taken 03/05/2019 1226) LTG: Pt will propel w/c in home environ  assist needed:: (D/C) -- Note: D/C

## 2019-03-06 ENCOUNTER — Inpatient Hospital Stay (HOSPITAL_COMMUNITY): Payer: Medicare Other | Admitting: Occupational Therapy

## 2019-03-06 ENCOUNTER — Inpatient Hospital Stay (HOSPITAL_COMMUNITY): Payer: Medicare Other

## 2019-03-06 ENCOUNTER — Encounter (HOSPITAL_COMMUNITY): Payer: Self-pay | Admitting: Physical Medicine & Rehabilitation

## 2019-03-06 LAB — GLUCOSE, CAPILLARY
Glucose-Capillary: 97 mg/dL (ref 70–99)
Glucose-Capillary: 93 mg/dL (ref 70–99)

## 2019-03-06 NOTE — Progress Notes (Signed)
Physical Therapy Session Note  Patient Details  Name: Juan Cameron MRN: [id number] Date of Birth: 12/14/1975  Today's Date: 03/06/2019 PT Individual Time: 1100-1156 PT Individual Time Calculation (min): 56 min   Short Term Goals: Week 1:  PT Short Term Goal 1 (Week 1): Pt will transfer stand<>pivot with LRAD CGA PT Short Term Goal 2 (Week 1): Pt will perform car transfer with LRAD CGA PT Short Term Goal 3 (Week 1): Pt will ambulate 36ft with LRAD min A  Skilled Therapeutic Interventions/Progress Updates:   Received pt supine in bed, pt agreeable to therapy, and denied any pain during session. Session focused on functional mobility/transfers, LE strength, ambulation, dynamic standing balance/coordination, and improved activity tolerance. Pt performed bed mobility independently. Pt donned shoes sitting EOB with supervision. Pt ambulated 39ft without AD CGA to WC. Pt transported to rehab apartment for energy conservation purposes. Pt performed ambulatory furniture transfer on couch and recliner without AD CGA. O2 sat 95% and HR 96bpm. Pt ambulated 39ft on uneven surfaces (ramp) without AD CGA. O2 sat 90% and HR 104bpm after activity increasing to 93% with 2 minute seated rest break. Pt transported to dayroom total assist. Seated in [employer], pt played "sorry" with therapist. Pt required min cues for strategy and technique and to read the cards. Pt with increased coughing and secretions during session and pt stated "my chest hurts" multiple times during session. Respiratory therapy and PA made aware and PA stated that the trach will be removed today. Pt transported back to room in [facility] total assist. Pt reported urge to use restroom and ambulated 92ft x 2 trials without AD CGA. Pt able to void while standing with CGA. Pt washed hands at sink standing with supervision. Concluded session with pt supine in bed, needs within reach, and bed alarm on.   Therapy Documentation Precautions:  Precautions Precautions:  Fall Precaution Comments: watch HR Restrictions Weight Bearing Restrictions: No  Therapy/Group: Individual Therapy Martin Majestic PT, DPT   03/06/2019, 7:32 AM

## 2019-03-06 NOTE — Progress Notes (Signed)
Occupational Therapy Session Note  Patient Details  Name: Juan Cameron MRN: [id number] Date of Birth: Mar 08, 1975  Today's Date: 03/06/2019 OT Individual Time: 3220-2542 OT Individual Time Calculation (min): 69 min    Short Term Goals: Week 1:  OT Short Term Goal 1 (Week 1): Pt will complete bathing at supervision level with <2 cues for sequencing/thoroughness OT Short Term Goal 2 (Week 1): Pt will complete LB dressing to include socks/shoes with min assist OT Short Term Goal 3 (Week 1): Pt will complete toilet transfers with LRAD with min assist OT Short Term Goal 4 (Week 1): Pt will gather clothing at ambulatory level with CGA prior to bathing/dressing  Skilled Therapeutic Interventions/Progress Updates:    Treatment session with focus on functional mobility and activity tolerance during self-care tasks.  Pt received supine in bed agreeable to washing at sink.  Pt completed ambulatory transfers in room without AD with CGA.  Engaged in bathing and dressing at sit > stand level with overall supervision/setup.  Completed oral care in sitting for energy conservation, despite encouragement to attempt in standing.  Pt ambulated 50' without AD with CGA.  Engaged in [facility] in standing initially, pt only tolerated standing ~2 mins before requesting to sit.  O2 sats remained 90% or higher on room air this session and HR  107-118 at rest and with standing activity and up to 126 after ambulation.  Returned to room and remained upright in w/c with all needs in reach and chair alarm on.  Therapy Documentation Precautions:  Precautions Precautions: Fall Precaution Comments: watch HR Restrictions Weight Bearing Restrictions: No General:   Vital Signs: Therapy Vitals Pulse Rate: (!) 106 Resp: 18 BP: 110/89 Patient Position (if appropriate): Sitting Oxygen Therapy SpO2: 98 % O2 Device: Room Air Pain: Pain Assessment Pain Scale: 0-10 Pain Score: 0-No pain   Therapy/Group: Individual  Therapy  Rosalio Loud 03/06/2019, 12:25 PM

## 2019-03-06 NOTE — Progress Notes (Signed)
Patient decanulated gauze placed with tape securing.  Patient able to talk.  SPO2 98% on Room Air.  HR 101  RT to monitor

## 2019-03-06 NOTE — Plan of Care (Signed)
  Problem: Consults Goal: RH GENERAL PATIENT EDUCATION Description: See Patient Education module for education specifics. Outcome: Progressing Goal: Skin Care Protocol Initiated - if Braden Score 18 or less Description: If consults are not indicated, leave blank or document N/A Outcome: Progressing   Problem: RH BOWEL ELIMINATION Goal: RH STG MANAGE BOWEL WITH ASSISTANCE Description: STG Manage Bowel with min Assistance. Outcome: Progressing   Problem: RH SKIN INTEGRITY Goal: RH STG SKIN FREE OF INFECTION/BREAKDOWN Description: Pt will be free of skin breakdown and infection prior to DC with min asssit Outcome: Progressing   Problem: RH SAFETY Goal: RH STG ADHERE TO SAFETY PRECAUTIONS W/ASSISTANCE/DEVICE Description: STG Adhere to Safety Precautions With cues/reminders Assistance/Device. Outcome: Progressing   Problem: RH PAIN MANAGEMENT Goal: RH STG PAIN MANAGED AT OR BELOW PT'S PAIN GOAL Description: Less than 3  Outcome: Progressing

## 2019-03-06 NOTE — Progress Notes (Signed)
Occupational Therapy Session Note  Patient Details  Name: Juan Cameron MRN: [id number] Date of Birth: 1975-06-21  Today's Date: 03/06/2019 OT Individual Time: 1400-1455 OT Individual Time Calculation (min): 55 min    Short Term Goals: Week 1:  OT Short Term Goal 1 (Week 1): Pt will complete bathing at supervision level with <2 cues for sequencing/thoroughness OT Short Term Goal 2 (Week 1): Pt will complete LB dressing to include socks/shoes with min assist OT Short Term Goal 3 (Week 1): Pt will complete toilet transfers with LRAD with min assist OT Short Term Goal 4 (Week 1): Pt will gather clothing at ambulatory level with CGA prior to bathing/dressing  Skilled Therapeutic Interventions/Progress Updates:    Treatment session with focus on functional mobility, activity tolerance, and dynamic standing balance.  Pt received upright in w/c agreeable to therapy session.  Pt requesting to engage in therapy session utilizing Wii.  Ambulated 3' without AD with CGA, therapist pushing w/c remainder of way.  Engaged in Wii bowling with pt able to stand 5 frames and additional 5 frames after seated rest break.  Pt O2 sats remained > 92% throughout session and HR 100-114 during activity.  Pt required increased encouragement to attempt other tasks.  Pt ambulated additional 59' with CGA without AD.  Returned to semi-reclined in bed with all needs in reach.  Therapy Documentation Precautions:  Precautions Precautions: Fall Precaution Comments: watch HR Restrictions Weight Bearing Restrictions: No General:   Vital Signs: Therapy Vitals Pulse Rate: (!) 106 Resp: 18 Patient Position (if appropriate): Sitting Oxygen Therapy SpO2: 98 % O2 Device: Room Air Pain:  Pt with no c/o pain   Therapy/Group: Individual Therapy  Rosalio Loud 03/06/2019, 3:13 PM

## 2019-03-06 NOTE — Progress Notes (Signed)
[facility] PHYSICAL MEDICINE & REHABILITATION PROGRESS NOTE  Subjective/Complaints: Patient seen sitting up in bed this morning.  He states he did not sleep well overnight due to people coming in and out of his room.  He states he likes male staff.  He states he can ambulate and proceeds to ambulate to the restroom with nursing.  He states he is being discharged on Friday.  ROS: Denies CP, SOB, N/V/D  Objective: Vital Signs: Blood pressure 117/79, pulse (!) 112, temperature 97.8 F (36.6 C), resp. rate 16, height 5\' 9"  (1.753 m), weight 95.7 kg, SpO2 94 %. DG Chest Port 1 View  Result Date: 03/05/2019 CLINICAL DATA:  COVID pneumonia EXAM: PORTABLE CHEST 1 VIEW COMPARISON:  02/15/2019 FINDINGS: The tracheostomy tube is stable. The cardiac silhouette, mediastinal and hilar contours are within normal limits given the AP projection portable technique. Overall much improved lung aeration. Minimal streaky patchy residual basilar opacities. No pleural effusions. The bony thorax is intact. IMPRESSION: Overall improved lung aeration with minimal residual streaky basilar opacities. Electronically Signed   By: 04/15/2019 M.D.   On: 03/05/2019 11:59   Recent Labs    03/05/19 1103  WBC 11.7*  HGB 11.9*  HCT 35.8*  PLT 391   No results for input(s): NA, K, CL, CO2, GLUCOSE, BUN, CREATININE, CALCIUM in the last 72 hours.  Physical Exam: BP 117/79 (BP Location: Right Arm)   Pulse (!) 112   Temp 97.8 F (36.6 C)   Resp 16   Ht 5\' 9"  (1.753 m)   Wt 95.7 kg   SpO2 94%   BMI 31.14 kg/m  Constitutional: No distress . Vital signs reviewed. HENT: Normocephalic.  Atraumatic.  Trach capped. Eyes: EOMI. No discharge. Cardiovascular: No JVD. Respiratory: Normal effort.  No stridor. GI: Non-distended. Skin: Warm and dry.  Intact. Psych: Normal mood.  Normal behavior. Musc: No edema in extremities.  No tenderness in extremities. Neuro: Alert Motor: Antigravity strength  throughout  Assessment/Plan: 1. Functional deficits secondary to debility secondary to Covid pneumonia with history of autism/ADHD which require 3+ hours per day of interdisciplinary therapy in a comprehensive inpatient rehab setting.  Physiatrist is providing close team supervision and 24 hour management of active medical problems listed below.  Physiatrist and rehab team continue to assess barriers to discharge/monitor patient progress toward functional and medical goals  Care Tool:  Bathing    Body parts bathed by patient: Right arm, Left arm, Chest, Abdomen, Front perineal area, Right upper leg, Left upper leg, Face   Body parts bathed by helper: Buttocks, Right lower leg, Left lower leg     Bathing assist Assist Level: Moderate Assistance - Patient 50 - 74%     Upper Body Dressing/Undressing Upper body dressing   What is the patient wearing?: Pull over shirt    Upper body assist Assist Level: Set up assist    Lower Body Dressing/Undressing Lower body dressing      What is the patient wearing?: Pants     Lower body assist Assist for lower body dressing: Minimal Assistance - Patient > 75%     Toileting Toileting    Toileting assist Assist for toileting: Contact Guard/Touching assist     Transfers Chair/bed transfer  Transfers assist     Chair/bed transfer assist level: Supervision/Verbal cueing     Locomotion Ambulation   Ambulation assist      Assist level: Contact Guard/Touching assist Assistive device: No Device Max distance: 50'   Walk 10 feet activity  Assist     Assist level: Contact Guard/Touching assist Assistive device: No Device   Walk 50 feet activity   Assist Walk 50 feet with 2 turns activity did not occur: Safety/medical concerns(fatigue, O2 sat, increased HR, decreased balance, bilateral LE weakness)  Assist level: Contact Guard/Touching assist Assistive device: No Device    Walk 150 feet activity   Assist Walk  150 feet activity did not occur: Safety/medical concerns(fatigue, O2 sat, increased HR, decreased balance, bilateral LE weakness)  Assist level: Contact Guard/Touching assist Assistive device: Walker-rolling    Walk 10 feet on uneven surface  activity   Assist Walk 10 feet on uneven surfaces activity did not occur: Safety/medical concerns(fatigue, O2 sat, increased HR, decreased balance, bilateral LE weakness)         Wheelchair     Assist Will patient use wheelchair at discharge?: Yes Type of Wheelchair: Manual    Wheelchair assist level: Supervision/Verbal cueing Max wheelchair distance: 119ft    Wheelchair 50 feet with 2 turns activity    Assist        Assist Level: Supervision/Verbal cueing   Wheelchair 150 feet activity     Assist     Assist Level: Supervision/Verbal cueing      Medical Problem List and Plan:  1. Debility secondary to chronic respiratory failure pulmonary fibrosis complicated by [id number] with pneumonia with history of autism/ADHD. Continue CPAP.   Continue CIR 2. Antithrombotics:   -DVT/anticoagulation: Subcutaneous heparin.    Vascular Dopplers negative for DVT  -antiplatelet therapy: N/A  3. Pain Management: Tylenol as needed  4. Mood: Celexa 10 mg daily, clonazepam 0.5 mg nightly  -antipsychotic agents: N/A  5. Neuropsych: This patient is not capable of making decisions on his own behalf.  6. Skin/Wound Care: Routine skin checks  7. Fluids/Electrolytes/Nutrition: Routine in and outs 8. 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   Will discuss plan to decannulate today-per chart review patient noted to be Desatting yesterday 9. Chronic anemia with sickle cell trait. Continue iron supplement   Hemoglobin 11.9 on 2/22  Continue to monitor 10. Hypertension. Lasix 20 mg daily.   Controlled on 2/23  Monitor with increased mobility 11. Possible constipation  Adjust bowel meds as necessary 12. Pulmonary  fibrosis-   was on 2L supplemental oxygen at al times at home.   Chest x-ray personally reviewed, improving 13. Previous ARDS/Sepsis/shock- has recovered, and treated for MRSA- finished Vanc  14.  Autism with ADHD- will monitor for chronic condition 15.  Sleep disturbance with history of OSA  Melatonin ordered  ?  Improving  Encourage CPAP after decannulation 16.  Leukocytosis   WBCs 11.7 on 2/22  Afebrile 17.  Tachycardia  Likely secondary to deconditioning  Coreg 3.125 started on 3/20  Labile on 2/23  LOS: 5 days A FACE TO FACE EVALUATION WAS PERFORMED  Juan Cameron [name] 03/06/2019, 8:21 AM

## 2019-03-07 ENCOUNTER — Inpatient Hospital Stay (HOSPITAL_COMMUNITY): Payer: Medicare Other | Admitting: Occupational Therapy

## 2019-03-07 ENCOUNTER — Inpatient Hospital Stay (HOSPITAL_COMMUNITY): Payer: Medicare Other

## 2019-03-07 DIAGNOSIS — I10 Essential (primary) hypertension: Secondary | ICD-10-CM

## 2019-03-07 NOTE — Progress Notes (Signed)
[facility] PHYSICAL MEDICINE & REHABILITATION PROGRESS NOTE  Subjective/Complaints: Patient seen laying in bed this AM.  He states he slept well overnight.  He is happy his trach was removed.  He is looking forward to discharge Friday.   ROS: Denies cough, CP, SOB, N/V/D  Objective: Vital Signs: Blood pressure 112/65, pulse (!) 105, temperature 98.4 F (36.9 C), resp. rate 18, height 5\' 9"  (1.753 m), weight 95.7 kg, SpO2 100 %. DG Chest Port 1 View  Result Date: 03/05/2019 CLINICAL DATA:  COVID pneumonia EXAM: PORTABLE CHEST 1 VIEW COMPARISON:  02/15/2019 FINDINGS: The tracheostomy tube is stable. The cardiac silhouette, mediastinal and hilar contours are within normal limits given the AP projection portable technique. Overall much improved lung aeration. Minimal streaky patchy residual basilar opacities. No pleural effusions. The bony thorax is intact. IMPRESSION: Overall improved lung aeration with minimal residual streaky basilar opacities. Electronically Signed   By: Marijo Sanes M.D.   On: 03/05/2019 11:59   Recent Labs    03/05/19 1103  WBC 11.7*  HGB 11.9*  HCT 35.8*  PLT 391   No results for input(s): NA, K, CL, CO2, GLUCOSE, BUN, CREATININE, CALCIUM in the last 72 hours.  Physical Exam: BP 112/65 (BP Location: Left Arm)   Pulse (!) 105   Temp 98.4 F (36.9 C)   Resp 18   Ht 5\' 9"  (1.753 m)   Wt 95.7 kg   SpO2 100%   BMI 31.14 kg/m  Constitutional: No distress . Vital signs reviewed. HENT: Normocephalic.  Atraumatic.  Stoma with dressing.  Eyes: EOMI. No discharge. Cardiovascular: No JVD. Respiratory: Normal effort.  No stridor. GI: Non-distended. Skin: Warm and dry.  Intact. Psych: Normal mood.  Normal behavior. Musc: No edema in extremities.  No tenderness in extremities. Neuro: Alert Motor: Antigravity strength throughout, unchanged  Assessment/Plan: 1. Functional deficits secondary to debility secondary to Covid pneumonia with history of autism/ADHD  which require 3+ hours per day of interdisciplinary therapy in a comprehensive inpatient rehab setting.  Physiatrist is providing close team supervision and 24 hour management of active medical problems listed below.  Physiatrist and rehab team continue to assess barriers to discharge/monitor patient progress toward functional and medical goals  Care Tool:  Bathing    Body parts bathed by patient: Right arm, Left arm, Chest, Abdomen, Front perineal area, Right upper leg, Left upper leg, Face, Buttocks   Body parts bathed by helper: Buttocks, Right lower leg, Left lower leg     Bathing assist Assist Level: Minimal Assistance - Patient > 75%     Upper Body Dressing/Undressing Upper body dressing   What is the patient wearing?: Pull over shirt    Upper body assist Assist Level: Set up assist    Lower Body Dressing/Undressing Lower body dressing      What is the patient wearing?: Pants, Underwear/pull up     Lower body assist Assist for lower body dressing: Supervision/Verbal cueing     Toileting Toileting    Toileting assist Assist for toileting: Contact Guard/Touching assist     Transfers Chair/bed transfer  Transfers assist     Chair/bed transfer assist level: Supervision/Verbal cueing     Locomotion Ambulation   Ambulation assist      Assist level: Contact Guard/Touching assist Assistive device: No Device Max distance: 77ft   Walk 10 feet activity   Assist     Assist level: Contact Guard/Touching assist Assistive device: No Device   Walk 50 feet activity   Assist  Walk 50 feet with 2 turns activity did not occur: Safety/medical concerns(fatigue, O2 sat, increased HR, decreased balance, bilateral LE weakness)  Assist level: Contact Guard/Touching assist Assistive device: No Device    Walk 150 feet activity   Assist Walk 150 feet activity did not occur: Safety/medical concerns(fatigue, O2 sat, increased HR, decreased balance, bilateral LE  weakness)  Assist level: Contact Guard/Touching assist Assistive device: Walker-rolling    Walk 10 feet on uneven surface  activity   Assist Walk 10 feet on uneven surfaces activity did not occur: Safety/medical concerns(fatigue, O2 sat, increased HR, decreased balance, bilateral LE weakness)         Wheelchair     Assist Will patient use wheelchair at discharge?: Yes Type of Wheelchair: Manual    Wheelchair assist level: Supervision/Verbal cueing Max wheelchair distance: 121ft    Wheelchair 50 feet with 2 turns activity    Assist        Assist Level: Supervision/Verbal cueing   Wheelchair 150 feet activity     Assist     Assist Level: Supervision/Verbal cueing      Medical Problem List and Plan:  1. Debility secondary to chronic respiratory failure pulmonary fibrosis complicated by COVID-19 with pneumonia with history of autism/ADHD. Continue CPAP.   Continue CIR  Team conference today to discuss current and goals and coordination of care, home and environmental barriers, and discharge planning with nursing, case manager, and therapies.  2. Antithrombotics:   -DVT/anticoagulation: Subcutaneous heparin.    Vascular Dopplers negative for DVT  -antiplatelet therapy: N/A  3. Pain Management: Tylenol as needed  4. Mood: Celexa 10 mg daily, clonazepam 0.5 mg nightly  -antipsychotic agents: N/A  5. Neuropsych: This patient is not capable of making decisions on his own behalf.  6. Skin/Wound Care: Routine skin checks  7. Fluids/Electrolytes/Nutrition: Routine in and outs 8. Tracheostomy: Trach d/ced on 2/23 9. Chronic anemia with sickle cell trait. Continue iron supplement   Hemoglobin 11.9 on 2/22  Continue to monitor 10. Hypertension. Lasix 20 mg daily.   Controlled on 2/23  Monitor with increased mobility 11. Possible constipation  Adjust bowel meds as necessary 12. Pulmonary fibrosis-   was on 2L supplemental oxygen at al times at home.    Chest x-ray personally reviewed, improving 13. Previous ARDS/Sepsis/shock- has recovered, and treated for MRSA- finished Vanc  14.  Autism with ADHD- will monitor for chronic condition  15.  Sleep disturbance with history of OSA  Melatonin ordered  Improving  Encourage CPAP after decannulation 16.  Leukocytosis   WBCs 11.7 on 2/22  Afebrile 17.  Tachycardia  Likely secondary to deconditioning  Coreg 3.125 started on 3/20  Borderline elevated on 2/24, however, blood pressures soft  LOS: 6 days A FACE TO FACE EVALUATION WAS PERFORMED  Gennifer Potenza Karis Juba 03/07/2019, 8:01 AM

## 2019-03-07 NOTE — Progress Notes (Signed)
Occupational Therapy Discharge Summary  Patient Details  Name: Juan Cameron MRN: [id number] Date of Birth: [birth year]   Patient has met 9 of 9 long term goals due to improved activity tolerance, improved balance, ability to compensate for deficits and improved awareness.  Patient to discharge at overall Supervision level.  Patient's care partner is independent to provide the necessary cognitive assistance at discharge.  Patient lives with sister who provides supervision at baseline due to cognitive impairments at baseline.  Pt has demonstrated improved activity tolerance and endurance during all self-care and leisure tasks.  Reasons goals not met: N/A  Recommendation:  Patient will benefit from ongoing skilled OT services in home health setting to continue to advance functional skills in the area of BADL and Reduce care partner burden.  Equipment: shower seat  Reasons for discharge: treatment goals met and discharge from hospital  Patient/family agrees with progress made and goals achieved: Yes  OT Discharge Precautions/Restrictions  Precautions Precautions: Fall Precaution Comments: watch HR Vital Signs Therapy Vitals Temp: 98 F (36.7 C) Pulse Rate: (!) 105 Resp: 18 BP: 108/79 Patient Position (if appropriate): Sitting Oxygen Therapy SpO2: 98 % Pain Pain Assessment Pain Scale: 0-10 Pain Score: 0-No pain ADL ADL Eating: Independent Where Assessed-Eating: Chair Grooming: Modified independent Where Assessed-Grooming: Sitting at sink Upper Body Bathing: Setup, Supervision/safety Where Assessed-Upper Body Bathing: Shower Lower Body Bathing: Supervision/safety Where Assessed-Lower Body Bathing: Shower Upper Body Dressing: Setup Where Assessed-Upper Body Dressing: Sitting at sink Lower Body Dressing: Supervision/safety Where Assessed-Lower Body Dressing: Standing at sink, Sitting at sink Toileting: Supervision/safety Where Assessed-Toileting: Contractor: Distant supervision Armed forces technical officer Method: Human resources officer: Close supervison Clinical cytogeneticist Method: Magazine features editor: Distant supervision Social research officer, government Method: Heritage manager: Civil engineer, contracting with back Vision Baseline Vision/History: No visual deficits Patient Visual Report: No change from baseline Vision Assessment?: No apparent visual deficits Cognition Overall Cognitive Status: History of cognitive impairments - at baseline Arousal/Alertness: Awake/alert Orientation Level: Oriented X4 Attention: Sustained;Selective Sustained Attention: Appears intact Selective Attention: Appears intact Memory: Appears intact Awareness: Impaired Problem Solving: Impaired Safety/Judgment: Impaired Sensation Sensation Light Touch: Appears Intact Proprioception: Appears Intact Coordination Fine Motor Movements are Fluid and Coordinated: Yes Finger Nose Finger Test: [facility] Extremity/Trunk Assessment RUE Assessment RUE Assessment: Within Functional Limits General Strength Comments: 5/5 LUE Assessment LUE Assessment: Within Functional Limits General Strength Comments: 5/5   HOXIE, [name] 03/07/2019, 3:31 PM

## 2019-03-07 NOTE — Discharge Summary (Addendum)
Physician Discharge Summary  Patient ID: Juan Cameron MRN: [id number] DOB/AGE: September 30, 1975 44 y.o.  Admit date: 03/01/2019 Discharge date: 03/09/2019  Discharge Diagnoses:  Principal Problem:   Debility Active Problems:   Status post tracheostomy (HCC)   Sleep disturbance   Supplemental oxygen dependent   Anemia, chronic disease   Benign essential HTN DVT prophylaxis Constipation Pulmonary fibrosis with previous history of ARDS Mood stabilization  Discharged Condition: Stable  Significant Diagnostic Studies: DG Abd 1 View  Result Date: 02/24/2019 CLINICAL DATA:  Check NG tube placement. EXAM: ABDOMEN - 1 VIEW COMPARISON:  Abdominal radiograph 02/24/2019 FINDINGS: The nasogastric tube side port projects over the distal stomach/pylorus. Nonobstructive bowel gas pattern. No supine evidence for free air. IMPRESSION: Nasogastric tube side port projects over the distal stomach/pylorus. Electronically Signed   By: Emmaline Kluver M.D.   On: 02/24/2019 18:19   DG Abd 1 View  Result Date: 02/24/2019 CLINICAL DATA:  Check NG placement EXAM: ABDOMEN - 1 VIEW COMPARISON:  02/18/2019 FINDINGS: Scattered large and small bowel gas is noted. Gastric catheter is noted coiled within the stomach. No obstructive changes are seen. No bony abnormality is noted. IMPRESSION: Gastric catheter within the stomach. Electronically Signed   By: Alcide Clever M.D.   On: 02/24/2019 13:18   DG Chest Port 1 View  Result Date: 03/05/2019 CLINICAL DATA:  COVID pneumonia EXAM: PORTABLE CHEST 1 VIEW COMPARISON:  02/15/2019 FINDINGS: The tracheostomy tube is stable. The cardiac silhouette, mediastinal and hilar contours are within normal limits given the AP projection portable technique. Overall much improved lung aeration. Minimal streaky patchy residual basilar opacities. No pleural effusions. The bony thorax is intact. IMPRESSION: Overall improved lung aeration with minimal residual streaky basilar opacities.  Electronically Signed   By: Rudie Meyer M.D.   On: 03/05/2019 11:59   DG CHEST PORT 1 VIEW  Result Date: 02/15/2019 CLINICAL DATA:  Patient unresponsive.  Pneumonia. EXAM: PORTABLE CHEST 1 VIEW COMPARISON:  PA and lateral chest 06/24/2015. FINDINGS: NG tube courses into the stomach and below the inferior margin of the film. Tracheostomy tube projects in good position. Lung volumes are somewhat low with bibasilar atelectasis. No consolidative process, pneumothorax or effusion. Heart size is upper normal. IMPRESSION: No acute finding in a low volume chest. Tracheostomy tube projects in good position. NG tube courses into the stomach and below the inferior margin of the film. Electronically Signed   By: Drusilla Kanner M.D.   On: 02/15/2019 12:31   DG Abd Portable 1V  Result Date: 02/18/2019 CLINICAL DATA:  NG tube placement. EXAM: PORTABLE ABDOMEN - 1 VIEW COMPARISON:  None. FINDINGS: An NG tube is identified with tip overlying the distal stomach. Bowel gas pattern is unchanged. IMPRESSION: NG tube with tip overlying the distal stomach. Electronically Signed   By: Harmon Pier M.D.   On: 02/18/2019 06:40   DG Abd Portable 1V  Result Date: 02/14/2019 CLINICAL DATA:  NG tube placement EXAM: PORTABLE ABDOMEN - 1 VIEW COMPARISON:  None. FINDINGS: Tip the NG tube is seen within the distal stomach. Mildly dilated air-filled loops of small bowel in the mid abdomen. No radio-opaque calculi or other significant radiographic abnormality are seen. IMPRESSION: Tip the NG tube within the distal stomach. Electronically Signed   By: Jonna Clark M.D.   On: 02/14/2019 22:41   VAS [place] LOWER EXTREMITY VENOUS (DVT)  Result Date: 03/02/2019  Lower Venous DVTStudy Indications: Swelling.  Risk Factors: None identified. Limitations: Body habitus, poor ultrasound/tissue interface and patient  positioning, poor patient cooperation. Comparison Study: No prior studies. Performing Technologist: Chanda Busing RVT  Examination  Guidelines: A complete evaluation includes B-mode imaging, spectral Doppler, color Doppler, and power Doppler as needed of all accessible portions of each vessel. Bilateral testing is considered an integral part of a complete examination. Limited examinations for reoccurring indications may be performed as noted. The reflux portion of the exam is performed with the patient in reverse Trendelenburg.  +---------+---------------+---------+-----------+----------+--------------+ RIGHT    CompressibilityPhasicitySpontaneityPropertiesThrombus Aging +---------+---------------+---------+-----------+----------+--------------+ CFV      Full           Yes      Yes                                 +---------+---------------+---------+-----------+----------+--------------+ SFJ      Full                                                        +---------+---------------+---------+-----------+----------+--------------+ FV Prox  Full                                                        +---------+---------------+---------+-----------+----------+--------------+ FV Mid   Full                                                        +---------+---------------+---------+-----------+----------+--------------+ FV DistalFull                                                        +---------+---------------+---------+-----------+----------+--------------+ PFV      Full                                                        +---------+---------------+---------+-----------+----------+--------------+ POP      Full           Yes      Yes                                 +---------+---------------+---------+-----------+----------+--------------+ PTV      Full                                                        +---------+---------------+---------+-----------+----------+--------------+ PERO     Full                                                         +---------+---------------+---------+-----------+----------+--------------+   +---------+---------------+---------+-----------+----------+--------------+  LEFT     CompressibilityPhasicitySpontaneityPropertiesThrombus Aging +---------+---------------+---------+-----------+----------+--------------+ CFV      Full           Yes      Yes                                 +---------+---------------+---------+-----------+----------+--------------+ SFJ      Full                                                        +---------+---------------+---------+-----------+----------+--------------+ FV Prox  Full                                                        +---------+---------------+---------+-----------+----------+--------------+ FV Mid   Full                                                        +---------+---------------+---------+-----------+----------+--------------+ FV DistalFull                                                        +---------+---------------+---------+-----------+----------+--------------+ PFV      Full                                                        +---------+---------------+---------+-----------+----------+--------------+ POP      Full           Yes      Yes                                 +---------+---------------+---------+-----------+----------+--------------+ PTV      Full                                                        +---------+---------------+---------+-----------+----------+--------------+ PERO     Full                                                        +---------+---------------+---------+-----------+----------+--------------+     Summary: RIGHT: - There is no evidence of deep vein thrombosis in the lower extremity.  - No cystic structure found in the popliteal fossa.  LEFT: - There is no evidence of deep vein thrombosis in the lower extremity.  - No cystic  structure found in the popliteal fossa.   *See table(s) above for measurements and observations. Electronically signed by Waverly Ferrari MD on 03/02/2019 at 4:59:38 PM.    Final     Labs:  Basic Metabolic Panel: Recent Labs  Lab 03/02/19 0520  NA 138  K 3.9  CL 100  CO2 28  GLUCOSE 103*  BUN 11  CREATININE 1.13  CALCIUM 9.4    CBC: Recent Labs  Lab 03/02/19 0520 03/05/19 1103  WBC 11.2* 11.7*  NEUTROABS 6.4 7.4  HGB 11.3* 11.9*  HCT 34.4* 35.8*  MCV 84.7 84.2  PLT 356 391    CBG: Recent Labs  Lab 03/05/19 1206 03/05/19 1721 03/06/19 1158 03/06/19 1655  GLUCAP 98 81 97 93   Family history.  Mother with myocardial infarction.  Denies any hypertension, diabetes mellitus or colon cancer  Brief HPI:   Juan Cameron is a 44 y.o. right-handed male with history of autism with element of attention deficit hyperactivity disorder as well as pulmonary fibrosis with 2 L oxygen at home, hypertension, hyperlipidemia, chronic anemia and sickle cell trait who lives with his sister in a 1 level apartment.  Required supervision prior to admission.  Well-known to rehab services from admission 2017 for debilitation secondary to chronic respiratory failure.  Presented [facility] January 07, 2019 with increasing shortness of breath found to have COVID-19 pneumonia as well as multifocal bacterial pneumonia.  Patient was treated with dexamethasone and tracheal aspirate positive for MRSA completing 14-day course of vancomycin.  Hospital course complicated by development of sepsis with septic shock ARDS.  Hospital course ventilatory support with tracheostomy performed complicated by some tracheal stoma bleeding requiring embolization.  [facility] currently with a #6 cuffless trach that has been capped for 72 hours planning decannulation.  Subcutaneous heparin for DVT prophylaxis.  Diet was advanced to regular.  Patient was admitted for a comprehensive rehab  program   Hospital Course: ELIAZ FOUT was admitted to rehab 03/01/2019 for inpatient therapies to consist of PT, ST and OT at least three hours five days a week. Past admission physiatrist, therapy team and rehab RN have worked together to provide customized collaborative inpatient rehab.  Pertaining to patient chronic respiratory failure pulmonary fibrosis complicated by COVID-19 with pneumonia.  Antibiotic course completed.  Continued oxygen as directed.  Tracheostomy tube had been decannulated oxygen saturations 98%.  Subcutaneous heparin for DVT prophylaxis venous Doppler studies negative.  Mood stabilization with Celexa as well as clonazepam and attending full therapies.  Chronic anemia sickle cell trait latest hemoglobin 11.9.  Blood pressure controlled low-dose Lasix as well as Coreg 3.125 mg no signs of fluid overload.  Bouts of constipation resolved with laxative assistance.  He was using some melatonin to help aid in sleep.   Blood pressures were monitored on TID basis and remained somewhat soft without orthostasis  He/ is continent of bowel and bladder.  He/ has made gains during rehab stay and is attending therapies  He/She will continue to receive follow up therapies   after discharge  Rehab course: During patient's stay in rehab weekly team conferences were held to monitor patient's progress, set goals and discuss barriers to discharge. At admission, patient required min mod assist for ambulation 18 feet moderate assist overall ADLs  Physical exam.  Blood pressure 128/70 pulse 80 temperature 98 respirations 18 oxygen saturation 92% room air Constitutional.  Well-developed well-nourished no distress awake alert appropriate poor eye contact Looking  exactly where I was not. HEENT Head.  Normocephalic and atraumatic Nose.  Normal Mouth and throat oropharynx clear and moist no oropharyngeal exudate Trach initially in place Neck.  Supple nontender no JVD Cardiac regular rate rhythm  without extra sounds or murmur heard Respiratory.  Effort normal no stridor no respiratory distress GI.  Soft nontender positive bowel sounds Musculoskeletal. Cervical back normal range of motion Upper extremities 5- out of 5 bilateral deltoids biceps triceps wrist extension grip and finger abduction Lower extremities hip flexion 3+ out of 5 bilaterally otherwise 4+ out of 5 lower extremities knee extension knee flexion dorsi plantarflexion   He/  has had improvement in activity tolerance, balance, postural control as well as ability to compensate for deficits. He/ has had improvement in functional use RUE/LUE  and RLE/LLE as well as improvement in awareness.  Working with energy conservation techniques.  Ambulates 20 feet without assistive device contact-guard.  Sessions focused on functional mobility transfers lower extremity strength ambulation curb navigation balance coordination.  Perform bed mobility independently and don shoes sitting edge of bed supervision.  Navigating curb x2 rolling walker supervision.  Gather belongings for activities day living homemaking.  Again patient did need supervision for safety.  Family teaching completed plan discharge to home       Disposition: Discharge disposition: 01-Home or Self Care     Discharge to home   Diet: Regular  Special Instructions: No driving smoking or alcohol Continue oxygen therapy as prior to admission  Medications at discharge 1.  Vitamin C 500 mg p.o. twice daily 2.  Coreg 3.125 mg p.o. twice daily 3.  Vitamin D 1000 units p.o. daily 4.  Celexa 10 mg p.o. daily 5.  Klonopin 0.5 mg p.o. nightly 6.  Pepcid 20 mg p.o. twice daily 7.  Ferrous sulfate 325 mg p.o. daily 8.  Folic acid 1 mg p.o. daily 9.  Lasix 20 mg p.o. daily 10.  Senokot 1 tab p.o. nightly 11.  MiraLAX daily hold for loose stools 12.  Zinc sulfate 220 mg p.o. daily     Follow-up Information     Jamse Arn, MD Follow up.   Specialty:  Physical Medicine and Rehabilitation Why: No follow-up needed Contact information: [address] [place] [address] [phone]            Signed: Cathlyn Parsons 03/09/2019, 5:12 AM Patient was seen, face-face, and physical exam performed by me on day of discharge, less than 30 minutes of total time spent.. Please see progress note from day of discharge as well.  Delice Lesch, MD, ABPMR

## 2019-03-07 NOTE — Progress Notes (Signed)
Occupational Therapy Session Note  Patient Details  Name: Juan Cameron MRN: [id number] Date of Birth: [birth year]  Today's Date: 03/07/2019 OT Individual Time: 0945-1100 OT Individual Time Calculation (min): 75 min    Short Term Goals: Week 1:  OT Short Term Goal 1 (Week 1): Pt will complete bathing at supervision level with <2 cues for sequencing/thoroughness OT Short Term Goal 2 (Week 1): Pt will complete LB dressing to include socks/shoes with min assist OT Short Term Goal 3 (Week 1): Pt will complete toilet transfers with LRAD with min assist OT Short Term Goal 4 (Week 1): Pt will gather clothing at ambulatory level with CGA prior to bathing/dressing  Skilled Therapeutic Interventions/Progress Updates:    Treatment session with focus on activity tolerance and endurance during self-care tasks and leisure activities. Pt received upright in w/c agreeable to shower.  Pt ambulated around room without AD to gather clothing prior to shower.  Completed toilet transfers and toileting with distant supervision.  Engaged in bathing at sit > stand level in room shower with use of seat for energy conservation.  O2 sats 94% and HR 107 post shower on room air.  Engaged in dressing with supervision for LB dressing.  Engaged in tub/shower transfers with pt stepping over tub ledge with supervision.  Again reiterated use of shower seat for energy conservation with pt in agreement.  Engaged in Wii dance with focus on activity tolerance and endurance.  Pt tolerating 3-4 mins high energy dancing with supervision.  HR increased to high 120s and O2 dropped to 86% but O2 returned to mid 90s and HR down to 110s in 1-2 mins.  Returned to room and left upright in w/c with chair alarm on and all needs in reach.  Therapy Documentation Precautions:  Precautions Precautions: Fall Precaution Comments: watch HR Restrictions Weight Bearing Restrictions: No General:   Vital Signs: Therapy Vitals Pulse Rate: 84 Resp:  16 Patient Position (if appropriate): Sitting Oxygen Therapy SpO2: 96 % O2 Device: Room Air Pain:  Pt with no c/o pain   Therapy/Group: Individual Therapy  Rosalio Loud 03/07/2019, 12:09 PM

## 2019-03-07 NOTE — Progress Notes (Signed)
Physical Therapy Session Note  Patient Details  Name: Juan Cameron MRN: [id number] Date of Birth: [birth year]  Today's Date: 03/07/2019 PT Individual Time: 6734-1937 PT Individual Time Calculation (min): 45 min   Short Term Goals: Week 1:  PT Short Term Goal 1 (Week 1): Pt will transfer stand<>pivot with LRAD CGA PT Short Term Goal 2 (Week 1): Pt will perform car transfer with LRAD CGA PT Short Term Goal 3 (Week 1): Pt will ambulate 56ft with LRAD min A  Skilled Therapeutic Interventions/Progress Updates:   Received pt supine in bed, RN present administering medication, pt agreeable to therapy, and denied any pain during session. Session focused on functional mobility/transfers, LE strength, ambulation, curb navigation balance/coordination, and improved endurance with activity. Pt reported improved throat discomfort after having trach removed yesterday. Pt performed bed mobility independently and donned shoes sitting EOB with supervision. Pt ambulated 8ft without AD CGA to WC. Pt transported to gym in [facility] for energy conservation. Pt navigated 1 curb x 2 trials with RW supervision. O2 sat 83% and HR 117 bpm increasing to 97% and HR 104 bpm with 2 minute seated rest break. Pt required verbal cues for technique and RW safety. Pt ambulated 39ft without AD CGA and picked up 3 cups from floor with CGA. O2 sat 82% and HR 115 bpm increasing to 95% and HR 100 bpm with 3 minute seated rest break. Pt performed TUG without AD CGA trial 1&2: 12 seconds and with RW supervision trial 1&2: 13 seconds. Pt educated on results and therapist recommended use of RW upon discharge for safety/stability; pt verbalized understanding. O2 sat 88% and HR 118 bpm increasing to 95% and HR 104 bpm with 4 minute seated rest break. Concluded session with pt sitting in WC, needs within reach, and chair pad alarm on. Therapist provided ice cream and drink to pt.   Therapy Documentation Precautions:  Precautions Precautions:  Fall Precaution Comments: watch HR Restrictions Weight Bearing Restrictions: No  Therapy/Group: Individual Therapy Martin Majestic PT, DPT   03/07/2019, 7:32 AM

## 2019-03-07 NOTE — Progress Notes (Signed)
Occupational Therapy Session Note  Patient Details  Name: OZAN MACLAY MRN: [id number] Date of Birth: 25-Oct-1975  Today's Date: 03/07/2019 OT Individual Time: 1405-1500 OT Individual Time Calculation (min): 55 min    Short Term Goals: Week 1:  OT Short Term Goal 1 (Week 1): Pt will complete bathing at supervision level with <2 cues for sequencing/thoroughness OT Short Term Goal 2 (Week 1): Pt will complete LB dressing to include socks/shoes with min assist OT Short Term Goal 3 (Week 1): Pt will complete toilet transfers with LRAD with min assist OT Short Term Goal 4 (Week 1): Pt will gather clothing at ambulatory level with CGA prior to bathing/dressing  Skilled Therapeutic Interventions/Progress Updates:    Treatment session with focus on activity tolerance and endurance during bowling activity.  Engaged in bowling with actual pins and ball to increase challenge on balance and endurance.  Pt demonstrating good balance reactions with quick movements, requiring one instance of CGA but otherwise Supervision to Mod I during bowling activity.  Frequent rest breaks provided during session as pt required.  HR maintained in 110s throughout activity and O2 decreased only to 88% one instance but returned to mid 90s in 1 minute.  Pt reports enjoying activity.  Pt returned to room and left upright in w/c with all needs in reach and chair alarm on.  Therapy Documentation Precautions:  Precautions Precautions: Fall Precaution Comments: watch HR Restrictions Weight Bearing Restrictions: No General:   Vital Signs: Therapy Vitals Temp: 98 F (36.7 C) Pulse Rate: (!) 105 Resp: 18 BP: 108/79 Patient Position (if appropriate): Sitting Oxygen Therapy SpO2: 98 % O2 Device: Room Air Pain:  Pt with no c/o pain   Therapy/Group: Individual Therapy  Rosalio Loud 03/07/2019, 3:15 PM

## 2019-03-07 NOTE — Patient Care Conference (Signed)
Inpatient RehabilitationTeam Conference and Plan of Care Update Date: 03/07/2019   Time: 11:30 AM   Patient Name: Juan Cameron      Medical Record Number: [id number]  Date of Birth: 14-Feb-1975 Sex: Male         Room/Bed: 4W01C/4W01C-01 Payor Info: Payor: MEDICARE / Plan: MEDICARE PART A AND B / Product Type: *No Product type* /    Admit Date/Time:  03/01/2019  4:47 PM  Primary Diagnosis:  Debility  Patient Active Problem List   Diagnosis Date Noted  . Benign essential HTN   . Anemia, chronic disease   . Status post tracheostomy ([place])   . Sleep disturbance   . Supplemental oxygen dependent   . Acute on chronic respiratory failure with hypoxia ([place])   . COVID-19 virus infection   . Pneumonia due to COVID-19 virus   . Postinflammatory pulmonary fibrosis ([place])   . Autism   . Dysphagia   . Acute blood loss anemia   . Anemia of chronic disease   . Respiratory failure ([place]) 06/10/2015  . Acute on chronic respiratory failure with hypoxia ([place])   . Chronic diastolic CHF (congestive heart failure) ([place])   . Sinus tachycardia   . Acute renal failure ([place])   . Sickle cell trait ([place])   . Essential hypertension   . SOB (shortness of breath)   . Pneumonia   . Acute pulmonary edema ([facility])   . Hypoxemia   . Sepsis ([place])   . Cough   . Right knee pain   . Cognitive deficits   . Tachycardia   . Debility 05/06/2015  . Encephalopathy 05/06/2015  . 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 Ventilator dependence ([place]) 04/08/2015  . Pressure ulcer 04/05/2015  . Acute respiratory failure ([place])   . Fever 03/21/2015  . Acute renal insufficiency 03/21/2015  . Obesity 03/21/2015  . ARDS (adult respiratory distress syndrome) ([place])   . HCAP (healthcare-associated pneumonia) 03/11/2015  . Leukocytosis 03/11/2015  . Acute hyperglycemia 03/11/2015  . Left ventricular diastolic dysfunction, NYHA class 2 03/11/2015  . Thrombocytopenia ([place]) 03/11/2015  . Acute lung injury 03/10/2015  . Acute  respiratory failure with hypoxia ([place]) 02/28/2015  . CAP (community acquired pneumonia) 02/28/2015  . Hypokalemia 02/28/2015  . Autism 02/28/2015    Expected Discharge Date: Expected Discharge Date: 03/09/19  Team Members Present: Physician leading conference: Dr. Delice Lesch Nurse Present: Dorien Chihuahua, Edwina Barth, LPN Case Manager: Karene Fry, RN PT Present: Becky Sax, PT OT Present: Simonne Come, OT SLP Present: Colon Flattery, SLP PPS Coordinator present : Gunnar Fusi, SLP     Current Status/Progress Goal Weekly Team Focus  Bowel/Bladder   Continent of bowl and bladder, pt ambulates to bathroom with assistance  remain continent of urine and stool  QS/PRN assistance and assessment of patient's bowel/bladder pattern   Swallow/Nutrition/ Hydration             ADL's   CGA ambulatory transfers without AD, Supervision dressing, Min assist bathing  Supervision overall  ADL retraining, activity tolerance, endurance, dynamic standing balance, pt/family education   Mobility   bed mobility supervision, transfers CGA without AD supervision with RW, ambulation 139ft with RW CGA/supervision, WC mobility 144ft supervision, 1 curb with RW CGA  supervision with LRAD  functional mobility/transfers, ambulation, dynamic standing balance/coordination, LE strength, imporved endurance   Communication             Safety/Cognition/ Behavioral Observations            Pain  no complaints of pain from patient  remain free of pain  QS/PRN assessment of pain   Skin   No signs of skin break down  remain free of breakdown  QS/PRN  assessment of patient's skin    Rehab Goals Patient on target to meet rehab goals: Yes *See Care Plan and progress notes for long and short-term goals.     Barriers to Discharge  Current Status/Progress Possible Resolutions Date Resolved   Nursing  Other (comments)  decreased mental capacity            PT  Other (comments)  O2 sats decrease with activity,  requires frequent rest breaks              OT                  SLP                SW Trach Sister plans to take patient home with her and provide assistance as needed CPAP used PTA with O2 HS, DME ordered that he did not have at home          Discharge Planning/Teaching Needs:  Home with sister who can provide assistance  TBD   Team Discussion: Decannulated yesterday, stable, HR up, med started, BP low.  RN trach redressed, cont B/B.  OT showered today, amb in room close S, did some dancing today sister tomorrow for fam ed.  PT S bed/transfers/walker 150', S goals, up curb S with walker.   Revisions to Treatment Plan: N/A     Medical Summary Current Status: Debility secondary to chronic respiratory failure pulmonary fibrosis complicated by COVID-19 with pneumonia with history of autism/ADHD Weekly Focus/Goal: Improve mobility, tachycardia, stoma care, WBCs  Barriers to Discharge: Medical stability;Behavior   Possible Resolutions to Barriers: Therapies, follow vitals, dressing changes, follow labs   Continued Need for Acute Rehabilitation Level of Care: The patient requires daily medical management by a physician with specialized training in physical medicine and rehabilitation for the following reasons: Direction of a multidisciplinary physical rehabilitation program to maximize functional independence : Yes Medical management of patient stability for increased activity during participation in an intensive rehabilitation regime.: Yes Analysis of laboratory values and/or radiology reports with any subsequent need for medication adjustment and/or medical intervention. : Yes   I attest that I was present, lead the team conference, and concur with the assessment and plan of the team.   Lelon Frohlich M 03/07/2019, 2:52 PM   Team conference was held via web/ teleconference due to COVID - 19

## 2019-03-08 ENCOUNTER — Encounter (HOSPITAL_COMMUNITY): Payer: Medicare Other | Admitting: Occupational Therapy

## 2019-03-08 ENCOUNTER — Inpatient Hospital Stay (HOSPITAL_COMMUNITY): Payer: Medicare Other

## 2019-03-08 ENCOUNTER — Inpatient Hospital Stay (HOSPITAL_COMMUNITY): Payer: Medicare Other | Admitting: Physical Therapy

## 2019-03-08 ENCOUNTER — Ambulatory Visit (HOSPITAL_COMMUNITY): Payer: Medicare Other

## 2019-03-08 MED ORDER — PANTOPRAZOLE SODIUM 40 MG PO TBEC: 40.0000 mg | DELAYED_RELEASE_TABLET | Freq: Every day | ORAL | 2 refills | Status: AC

## 2019-03-08 MED ORDER — FUROSEMIDE 20 MG PO TABS: 20.0000 mg | ORAL_TABLET | Freq: Every day | ORAL | 0 refills | Status: AC

## 2019-03-08 MED ORDER — CITALOPRAM HYDROBROMIDE 10 MG PO TABS: 10.0000 mg | ORAL_TABLET | Freq: Every day | ORAL | 0 refills | Status: AC

## 2019-03-08 MED ORDER — CLONAZEPAM 0.5 MG PO TABS: 0.5000 mg | ORAL_TABLET | Freq: Every day | ORAL | 0 refills | Status: AC

## 2019-03-08 MED ORDER — FAMOTIDINE 20 MG PO TABS: 20.0000 mg | ORAL_TABLET | Freq: Two times a day (BID) | ORAL | 0 refills | Status: DC

## 2019-03-08 MED ORDER — ZINC SULFATE 220 (50 ZN) MG PO CAPS: 220.0000 mg | ORAL_CAPSULE | Freq: Every day | ORAL | 0 refills | Status: AC

## 2019-03-08 MED ORDER — VITAMIN D3 25 MCG PO TABS: 1000.0000 [IU] | ORAL_TABLET | Freq: Every day | ORAL | 0 refills | Status: AC

## 2019-03-08 MED ORDER — CARVEDILOL 3.125 MG PO TABS: 3.1250 mg | ORAL_TABLET | Freq: Two times a day (BID) | ORAL | 0 refills | Status: AC

## 2019-03-08 MED ORDER — FERROUS SULFATE 325 (65 FE) MG PO TABS: 325.0000 mg | ORAL_TABLET | Freq: Every day | ORAL | 3 refills | Status: AC

## 2019-03-08 MED ORDER — FOLIC ACID 1 MG PO TABS: 1.0000 mg | ORAL_TABLET | Freq: Every day | ORAL | 0 refills | Status: AC

## 2019-03-08 MED ORDER — SENNOSIDES-DOCUSATE SODIUM 8.6-50 MG PO TABS: 1.0000 | ORAL_TABLET | Freq: Every day | ORAL | Status: AC

## 2019-03-08 MED ORDER — POLYETHYLENE GLYCOL 3350 17 G PO PACK: 17.0000 g | PACK | Freq: Every day | ORAL | 0 refills | Status: AC

## 2019-03-08 NOTE — Progress Notes (Signed)
Physical Therapy Discharge Summary  Patient Details  Name: Juan Cameron MRN: [id number] Date of Birth: 11-Jan-1976  Patient has met 9 of 9 long term goals due to improved activity tolerance, improved balance, improved postural control, decreased pain, improved attention, improved awareness and improved coordination. Patient to discharge at an ambulatory level Supervision.  Patient's care partner is independent to provide the necessary physical assistance at discharge. Pt's sister attended family education training on 2/25 and verbalized and demonstrated confidence with all tasks to ensure safe discharge home. Pt's sister was educated on energy conservation strategies, breathing techniques, and RW safety strategies to assist pt with upon returning home.   All goals met  Recommendation:  Patient will benefit from ongoing skilled PT services in home health setting to continue to advance safe functional mobility, address ongoing impairments in ambulation with/without AD, stair navigation, LE strength, balance/coordination, endurance, and to minimize fall risk.  Equipment: No equipment provided  Reasons for discharge: treatment goals met  Patient/family agrees with progress made and goals achieved: Yes  PT Discharge Precautions/Restrictions Precautions Precautions: Fall Precaution Comments: watch HR and O2 sat Restrictions Weight Bearing Restrictions: No Cognition Overall Cognitive Status: History of cognitive impairments - at baseline Arousal/Alertness: Awake/alert Orientation Level: Oriented X4 Memory: Appears intact Awareness: Impaired Problem Solving: Impaired Safety/Judgment: Impaired Comments: pt is easily distracted Sensation Sensation Light Touch: Appears Intact Proprioception: Appears Intact Coordination Gross Motor Movements are Fluid and Coordinated: No Fine Motor Movements are Fluid and Coordinated: Yes Coordination and Movement Description: grossly uncoordinated due  to bilateral LE weakness, decreased balance, and fatigue. Able to compensate well with RW. Finger Nose Finger Test: [facility] Heel Shin Test: [facility] [facility] Motor  Motor Motor: Abnormal postural alignment and control Motor - Skilled Clinical Observations: grossly uncordinated due to decreased balance and bilateral LE weakness. Able to compensate well with RW.  Mobility Bed Mobility Bed Mobility: Rolling Right;Rolling Left;Supine to Sit;Sit to Supine Rolling Right: Independent Rolling Left: Independent Supine to Sit: Independent Sit to Supine: Independent Transfers Transfers: Sit to Stand;Stand to Sit;Stand Pivot Transfers Sit to Stand: Supervision/Verbal cueing Stand to Sit: Supervision/Verbal cueing Stand Pivot Transfers: Supervision/Verbal cueing Stand Pivot Transfer Details: Verbal cues for safe use of DME/AE;Verbal cues for precautions/safety Stand Pivot Transfer Details (indicate cue type and reason): verbal cues for RW safety Transfer (Assistive device): Rolling walker Locomotion  Gait Ambulation: Yes Gait Assistance: Supervision/Verbal cueing Gait Distance (Feet): 150 Feet Assistive device: Rolling walker Gait Assistance Details: Verbal cues for safe use of DME/AE;Verbal cues for precautions/safety Gait Assistance Details: verbal cues for RW safety and to avoid distractions Gait Gait: Yes Gait Pattern: Impaired Gait Pattern: Decreased trunk rotation;Decreased stride length;Narrow base of support Gait velocity: decreased Stairs / Additional Locomotion Stairs: Yes Stairs Assistance: Supervision/Verbal cueing Stair Management Technique: Two rails Number of Stairs: 4 Height of Stairs: 6 Ramp: Supervision/Verbal cueing(RW) Curb: Supervision/Verbal cueing(RW) Product manager Mobility: Yes Wheelchair Assistance: Independent with Camera operator: Both lower extermities Wheelchair Parts Management: Needs assistance Distance: 152f  Trunk/Postural  Assessment  Cervical Assessment Cervical Assessment: Within Functional Limits Thoracic Assessment Thoracic Assessment: Within Functional Limits Lumbar Assessment Lumbar Assessment: Exceptions to WFL(posterior pelvic tilt) Postural Control Postural Control: Deficits on evaluation  Balance Balance Balance Assessed: Yes Standardized Balance Assessment Standardized Balance Assessment: Timed Up and Go Test Timed Up and Go Test TUG: Normal TUG(with RW) Normal TUG (seconds): 13 Static Sitting Balance Static Sitting - Balance Support: No upper extremity supported Static Sitting - Level of Assistance: 7: Independent Dynamic  Sitting Balance Dynamic Sitting - Balance Support: No upper extremity supported Dynamic Sitting - Level of Assistance: 7: Independent Static Standing Balance Static Standing - Balance Support: No upper extremity supported Static Standing - Level of Assistance: 5: Stand by assistance(supervision) Dynamic Standing Balance Dynamic Standing - Balance Support: Bilateral upper extremity supported Dynamic Standing - Level of Assistance: 5: Stand by assistance(supervision) Extremity Assessment   RLE Assessment RLE Assessment: Exceptions to [facility] General Strength Comments: grossly generalized to 4/5 LLE Assessment LLE Assessment: Exceptions to [facility] General Strength Comments: grossly generalized to 4/5   Alfonse Alpers PT, DPT  03/08/2019, 7:54 AM

## 2019-03-08 NOTE — Progress Notes (Signed)
Physical Therapy Session Note  Patient Details  Name: Juan Cameron MRN: [id number] Date of Birth: February 13, 1975  Today's Date: 03/08/2019 PT Individual Time: 1003-1013 PT Individual Time Calculation (min): 10 min   Short Term Goals: Week 1:  PT Short Term Goal 1 (Week 1): Pt will transfer stand<>pivot with LRAD CGA PT Short Term Goal 2 (Week 1): Pt will perform car transfer with LRAD CGA PT Short Term Goal 3 (Week 1): Pt will ambulate 34ft with LRAD min A  Skilled Therapeutic Interventions/Progress Updates: Pt presents sitting up in w/c, non-verbal w/ this PT.  Pt does shake head to participate w/ therapy.  Pt encouraged to FT sister when refuses, but will not when asked.  Pt became lightly agitated when PT asks if can hand the patient his cell to call.  Continued to educated pt reasons for participating w/ therapy for return home safely, but remains verbally unresponsive.  Pt became more verbal as time increased, but still refusing to participate or call sister.  Pt left in w/c w/ chair alarm on and needs in reach.  Pt missed 20' of PT services today.       Therapy Documentation Precautions:  Precautions Precautions: Fall Precaution Comments: watch HR and O2 sat Restrictions Weight Bearing Restrictions: No General: PT Amount of Missed Time (min): 20 Minutes PT Missed Treatment Reason: Patient unwilling to participate;Other (Comment)(Pt nonverbal at initiation, won't participate.  Encouraged to [facility] sister as indicated byt sign in room, but unwilling.  Asked to hand the phone to pt, but agitated.) Vital Signs:   Pain: pt states knee pain but will not state or point to knee, or quantify pain.   Mobility: Bed Mobility Bed Mobility: Rolling Right;Rolling Left;Supine to Sit;Sit to Supine Rolling Right: Independent Rolling Left: Independent Supine to Sit: Independent Sit to Supine: Independent Transfers Transfers: Sit to Stand;Stand to Sit;Stand Pivot Transfers Sit to Stand:  Supervision/Verbal cueing Stand to Sit: Supervision/Verbal cueing Stand Pivot Transfers: Supervision/Verbal cueing Stand Pivot Transfer Details: Verbal cues for safe use of DME/AE;Verbal cues for precautions/safety Stand Pivot Transfer Details (indicate cue type and reason): verbal cues for RW safety Transfer (Assistive device): Rolling walker Locomotion : Gait Ambulation: Yes Gait Assistance: Supervision/Verbal cueing(RW) Gait Distance (Feet): 150 Feet Assistive device: Rolling walker Gait Assistance Details: Verbal cues for safe use of DME/AE;Verbal cues for precautions/safety Gait Assistance Details: verbal cues for RW safety and to focus on ambulating Gait Gait: Yes Gait Pattern: Impaired Gait Pattern: Decreased trunk rotation;Decreased stride length;Narrow base of support Gait velocity: decreased Stairs / Additional Locomotion Stairs: Yes Stairs Assistance: Supervision/Verbal cueing Stair Management Technique: Two rails Number of Stairs: 4 Height of Stairs: 6 Ramp: Supervision/Verbal cueing(RW) Wheelchair Mobility Wheelchair Mobility: Yes Wheelchair Assistance: Doctor, general practice: Both lower extermities Wheelchair Parts Management: Needs assistance Distance: 162ft  Trunk/Postural Assessment : Cervical Assessment Cervical Assessment: Within Functional Limits Thoracic Assessment Thoracic Assessment: Within Functional Limits Lumbar Assessment Lumbar Assessment: Exceptions to WFL(posterior pelvic tilt) Postural Control Postural Control: Deficits on evaluation  Balance: Balance Balance Assessed: Yes Standardized Balance Assessment Standardized Balance Assessment: Timed Up and Go Test Timed Up and Go Test TUG: Normal TUG(with RW) Normal TUG (seconds): 13 Static Sitting Balance Static Sitting - Balance Support: No upper extremity supported Static Sitting - Level of Assistance: 7: Independent Dynamic Sitting Balance Dynamic Sitting -  Balance Support: No upper extremity supported Dynamic Sitting - Level of Assistance: 7: Independent Static Standing Balance Static Standing - Balance Support: No upper extremity supported Static Standing -  Level of Assistance: 5: Stand by assistance(supervision) Dynamic Standing Balance Dynamic Standing - Balance Support: Bilateral upper extremity supported Dynamic Standing - Level of Assistance: 5: Stand by assistance(supervision) Exercises:   Other Treatments:      Therapy/Group: Individual Therapy  Juan Cameron 03/08/2019, 10:44 AM

## 2019-03-08 NOTE — Progress Notes (Signed)
Occupational Therapy Session Note  Patient Details  Name: Juan Cameron MRN: [id number] Date of Birth: December 25, 1975  Today's Date: 03/08/2019 OT Individual Time: 1400-1455 OT Individual Time Calculation (min): 55 min    Short Term Goals: Week 1:  OT Short Term Goal 1 (Week 1): Pt will complete bathing at supervision level with <2 cues for sequencing/thoroughness OT Short Term Goal 2 (Week 1): Pt will complete LB dressing to include socks/shoes with min assist OT Short Term Goal 3 (Week 1): Pt will complete toilet transfers with LRAD with min assist OT Short Term Goal 4 (Week 1): Pt will gather clothing at ambulatory level with CGA prior to bathing/dressing  Skilled Therapeutic Interventions/Progress Updates:    Upon entering the room, pt seated in wheelchair with sister present for hands on family education. His caregiver normally provides cuing and supervision for sequencing prior to this hospital admission. Pt ambulating with RW to dresser with caregiver providing supervision for pt to obtain clothing items at [place] and then into bathroom . Pt bathing from seated position on TTB with supervision from sister. Pt exiting the bathroom and seated on EOB for dressing tasks at supervision as well. Pt very fatigued and this point and taking rest break while therapist removes dressing over trach site and demonstrated how to change. Caregiver verbalized understanding. OT providing paper handout regarding energy conservation and gave several examples of how these techniques could allow him to be more independent at home safely. Pt and caregiver with no further concerns at this time.   Therapy Documentation Precautions:  Precautions Precautions: Fall Precaution Comments: watch HR and O2 sat Restrictions Weight Bearing Restrictions: No General:   Vital Signs: Therapy Vitals Temp: 98.3 F (36.8 C) Temp Source: Oral Pulse Rate: 94 Resp: 18 BP: 116/80 Patient Position (if appropriate):  Lying Oxygen Therapy SpO2: 100 % O2 Device: Room Air Pain:   ADL: ADL Eating: Independent Where Assessed-Eating: Chair Grooming: Modified independent Where Assessed-Grooming: Sitting at sink Upper Body Bathing: Setup, Supervision/safety Where Assessed-Upper Body Bathing: Shower Lower Body Bathing: Supervision/safety Where Assessed-Lower Body Bathing: Shower Upper Body Dressing: Setup Where Assessed-Upper Body Dressing: Sitting at sink Lower Body Dressing: Supervision/safety Where Assessed-Lower Body Dressing: Standing at sink, Sitting at sink Toileting: Supervision/safety Where Assessed-Toileting: Teacher, adult education: Distant supervision Statistician Method: Insurance claims handler: Close supervison Web designer Method: Event organiser: Distant supervision Film/video editor Method: Designer, industrial/product: Information systems manager with back   Therapy/Group: Individual Therapy  [name] 03/08/2019, 4:13 PM

## 2019-03-08 NOTE — Progress Notes (Signed)
Physical Therapy Session Note  Patient Details  Name: Juan Cameron MRN: [id number] Date of Birth: 11/28/1975  Today's Date: 03/08/2019 PT Individual Time: 2263-3354 and 1300-1345 PT Individual Time Calculation (min): 54 min and 45 min  Short Term Goals: Week 1:  PT Short Term Goal 1 (Week 1): Pt will transfer stand<>pivot with LRAD CGA PT Short Term Goal 2 (Week 1): Pt will perform car transfer with LRAD CGA PT Short Term Goal 3 (Week 1): Pt will ambulate 64ft with LRAD min A  Skilled Therapeutic Interventions/Progress Updates:   Treatment Session 1: 5625-6389 54 min Received pt supine in bed, pt agreeable to therapy, and denied any pain during session. Session focused on functional mobility/transfers, ambulation, LE strength, stair navigation, balance/coordination, and improved activity tolerance. Pt performed bed mobility independently. Pt ambulated 20ft to sink without AD CGA and performed oral care standing at sink with supervision. Pt donned shoes sitting in WC independently. Pt performed WC mobility 161ft using bilateral LEs independently. O2 sat 92% and HR 118 bpm after activity. Pt ambulated 66ft on uneven surfaces (ramp) with RW supervision. O2 sat 86% and HR 119 bpm increasing to 94% and HR 119 bpm after 3 minute seated rest break. Pt navigated 4 steps with 2 rails supervision ascending and descending with a step through pattern. Pt required verbal cues for slow pace and to avoid distractions. O2 sat 81% and HR 136 bpm increasing to 93% and HR 116 bpm with 3 minute seated rest break. Pt ambulated 11ft with RW supervision. Pt with decreased trunk rotation and decreased stride length. O2 sat 84% and HR 124 bpm increasing to 94% and HR 120 bpm with 3 minute seated rest break. Concluded session with pt sitting in WC, needs within reach, and chair pad alarm on. RN present administering medications. Therapist provided snack for pt.   Treatment Session 2: 1300-1345 45 min Received pt sitting  in WC, pt agreeable to therapy, and denied any pain during session. Sister present for family education training. Session focused on discharge planning, functional mobility/transfers, ambulation, LE strength, balance/coordination, simulated car transfers, curb/stair navigation, and improved activity tolerance. Pt ambulated 93ft without AD CGA to bathroom and able to void while standing with CGA. Pt ambulated 5ft to sink without AD CGA and washed hands with supervision. Pt transported to gym in [facility] for time management. Pt performed ambulatory simulated car transfer with RW supervision. O2 sat 86% increasing to 94% with 2 minute seated rest break. Pt's sister verbalized confidence with car transfer. Pt navigated 1 curb x 2 trials with RW and supervision. Pt required verbal cues to keep RW on floor and for slow pace. Pt's sister verbalized confidence with curb navigation and declined practicing again. O2 sat 87% increasing to 93% with 2 minute seated rest break. Pt ambulated 78ft with RW supervision. O2 sat 91% increasing to 96% after activity. Pt's sister verbalized confidence with all tasks to ensure safe discharge home and was educated on breathing techniques, energy conservation strategies, and RW safety. Concluded session with pt sitting in WC, needs within reach, and chair pad alarm on.   Therapy Documentation Precautions:  Precautions Precautions: Fall Precaution Comments: watch HR Restrictions Weight Bearing Restrictions: No   Therapy/Group: Individual Therapy Martin Majestic PT, DPT   03/08/2019, 7:25 AM

## 2019-03-08 NOTE — Progress Notes (Signed)
Patient refuses to wear the CPAP and has refused since admission.

## 2019-03-08 NOTE — Progress Notes (Signed)
[place] PHYSICAL MEDICINE & REHABILITATION PROGRESS NOTE  Subjective/Complaints: Patient seen laying in bed this morning.  He indicates he slept well overnight.  Limited engagement until topic of discharge tomorrow came up.  Patient affect brightens.  ROS: Denies cough, CP, SOB, N/V/D  Objective: Vital Signs: Blood pressure 106/71, pulse 93, temperature 98.6 F (37 C), temperature source Oral, resp. rate 18, height 5\' 9"  (1.753 m), weight 95.7 kg, SpO2 97 %. No results found. Recent Labs    03/05/19 1103  WBC 11.7*  HGB 11.9*  HCT 35.8*  PLT 391   No results for input(s): NA, K, CL, CO2, GLUCOSE, BUN, CREATININE, CALCIUM in the last 72 hours.  Physical Exam: BP 106/71 (BP Location: Right Arm)   Pulse 93   Temp 98.6 F (37 C) (Oral)   Resp 18   Ht 5\' 9"  (1.753 m)   Wt 95.7 kg   SpO2 97%   BMI 31.14 kg/m  Constitutional: No distress . Vital signs reviewed. HENT: Normocephalic.  Atraumatic.  Stoma with dressing. Eyes: EOMI. No discharge. Cardiovascular: No JVD. Respiratory: Normal effort.  No stridor. GI: Non-distended. Skin: Warm and dry.  Intact. Psych: Normal mood.  Normal behavior. Musc: No edema in extremities.  No tenderness in extremities. Neuro: Alert Motor: Antigravity strength throughout, stable  Assessment/Plan: 1. Functional deficits secondary to debility secondary to Covid pneumonia with history of autism/ADHD which require 3+ hours per day of interdisciplinary therapy in a comprehensive inpatient rehab setting.  Physiatrist is providing close team supervision and 24 hour management of active medical problems listed below.  Physiatrist and rehab team continue to assess barriers to discharge/monitor patient progress toward functional and medical goals  Care Tool:  Bathing    Body parts bathed by patient: Right arm, Left arm, Chest, Abdomen, Front perineal area, Right upper leg, Left upper leg, Face, Buttocks, Right lower leg, Left lower leg   Body  parts bathed by helper: Buttocks, Right lower leg, Left lower leg     Bathing assist Assist Level: Supervision/Verbal cueing     Upper Body Dressing/Undressing Upper body dressing   What is the patient wearing?: Pull over shirt    Upper body assist Assist Level: Independent    Lower Body Dressing/Undressing Lower body dressing      What is the patient wearing?: Pants, Underwear/pull up     Lower body assist Assist for lower body dressing: Supervision/Verbal cueing     Toileting Toileting    Toileting assist Assist for toileting: Supervision/Verbal cueing     Transfers Chair/bed transfer  Transfers assist     Chair/bed transfer assist level: Supervision/Verbal cueing     Locomotion Ambulation   Ambulation assist      Assist level: Supervision/Verbal cueing Assistive device: Walker-rolling Max distance: 65ft   Walk 10 feet activity   Assist     Assist level: Supervision/Verbal cueing Assistive device: Walker-rolling   Walk 50 feet activity   Assist Walk 50 feet with 2 turns activity did not occur: Safety/medical concerns(fatigue, O2 sat, increased HR, decreased balance, bilateral LE weakness)  Assist level: Contact Guard/Touching assist Assistive device: No Device    Walk 150 feet activity   Assist Walk 150 feet activity did not occur: Safety/medical concerns(fatigue, O2 sat, increased HR, decreased balance, bilateral LE weakness)  Assist level: Contact Guard/Touching assist Assistive device: Walker-rolling    Walk 10 feet on uneven surface  activity   Assist Walk 10 feet on uneven surfaces activity did not occur: Safety/medical concerns(fatigue, O2  sat, increased HR, decreased balance, bilateral LE weakness)         Wheelchair     Assist Will patient use wheelchair at discharge?: Yes Type of Wheelchair: Manual    Wheelchair assist level: Supervision/Verbal cueing Max wheelchair distance: 168ft    Wheelchair 50 feet  with 2 turns activity    Assist        Assist Level: Supervision/Verbal cueing   Wheelchair 150 feet activity     Assist     Assist Level: Supervision/Verbal cueing      Medical Problem List and Plan:  1. Debility secondary to chronic respiratory failure pulmonary fibrosis complicated by COVID-19 with pneumonia with history of autism/ADHD. Continue CPAP.   Continue CIR 2. Antithrombotics:   -DVT/anticoagulation: Subcutaneous heparin.    Vascular Dopplers negative for DVT  -antiplatelet therapy: N/A  3. Pain Management: Tylenol as needed  4. Mood: Celexa 10 mg daily, clonazepam 0.5 mg nightly  -antipsychotic agents: N/A  5. Neuropsych: This patient is not capable of making decisions on his own behalf.  6. Skin/Wound Care: Routine skin checks  7. Fluids/Electrolytes/Nutrition: Routine in and outs 8. 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 9. Chronic anemia with sickle cell trait. Continue iron supplement   Hemoglobin 11.9 on 2/22  Continue to monitor 10. Hypertension. Lasix 20 mg daily.   Controlled on 2/24  Monitor with increased mobility 11. Possible constipation  Adjust bowel meds as necessary 12. Pulmonary fibrosis:  was on 2L supplemental oxygen at al times at home.   Chest x-ray personally reviewed, improving 13. Previous ARDS/Sepsis/shock- has recovered, and treated for MRSA- finished Vanc  14.  Autism with ADHD- will monitor for chronic condition  15.  Sleep disturbance with history of OSA  Melatonin ordered  Improved  Encourage CPAP 16.  Leukocytosis   WBCs 11.7 on 2/22  Afebrile 17.  Tachycardia  Likely secondary to deconditioning  Coreg 3.125 started on 3/20  Overall improving  LOS: 7 days A FACE TO FACE EVALUATION WAS PERFORMED  Jayanth Szczesniak Karis Juba 03/08/2019, 8:14 AM

## 2019-03-09 NOTE — Care Management (Addendum)
   The overall goal for the admission was met for:   Discharge location: Home with sister  Length of Stay: 8 days with discharge 03/09/19  Discharge activity level: Supervision overall  Home/community participation:Limited Paticipation  Services provided included: MD, RD, PT, OT, SLP, RN, CM, Pharmacy, Neuropsych and SW  Financial Services: Medicare and Medicaid  Follow-up services arranged: Home Health: PT, OT from [facility], DME: Tub Seat from [facility] and Patient/Family has no preference for HH/DME agencies  Comments (or additional information): [facility] [phone]  Patient/Family verbalized understanding of follow-up arrangements: Yes  Individual responsible for coordination of the follow-up plan: Sister: Treyshaun Keatts [phone]  Confirmed correct DME delivered: Margarito Liner 03/09/2019    Margarito Liner

## 2019-03-09 NOTE — Progress Notes (Signed)
[place] PHYSICAL MEDICINE & REHABILITATION PROGRESS NOTE  Subjective/Complaints: Patient seen sitting up in bed this morning.  He refused his CPAP overnight.  He indicates he slept well.  He indicates he is ready for discharge.  ROS: Denies cough, CP, SOB, N/V/D  Objective: Vital Signs: Blood pressure 116/70, pulse 100, temperature 97.6 F (36.4 C), temperature source Oral, resp. rate 16, height 5\' 9"  (1.753 m), weight 95.7 kg, SpO2 98 %. No results found. No results for input(s): WBC, HGB, HCT, PLT in the last 72 hours. No results for input(s): NA, K, CL, CO2, GLUCOSE, BUN, CREATININE, CALCIUM in the last 72 hours.  Physical Exam: BP 116/70 (BP Location: Left Arm)   Pulse 100   Temp 97.6 F (36.4 C) (Oral)   Resp 16   Ht 5\' 9"  (1.753 m)   Wt 95.7 kg   SpO2 98%   BMI 31.14 kg/m  Constitutional: No distress . Vital signs reviewed. HENT: Normocephalic.  Atraumatic.  Stoma with dressing. Eyes: EOMI. No discharge. Cardiovascular: No JVD. Respiratory: Normal effort.  No stridor. GI: Non-distended. Skin: See above. Psych: Limited engagement Musc: No edema in extremities.  No tenderness in extremities. Neuro: Alert Motor: Antigravity strength throughout, unchanged  Assessment/Plan: 1. Functional deficits secondary to debility secondary to Covid pneumonia with history of autism/ADHD which require 3+ hours per day of interdisciplinary therapy in a comprehensive inpatient rehab setting.  Physiatrist is providing close team supervision and 24 hour management of active medical problems listed below.  Physiatrist and rehab team continue to assess barriers to discharge/monitor patient progress toward functional and medical goals  Care Tool:  Bathing  Bathing activity did not occur: Refused Body parts bathed by patient: Right arm, Left arm, Chest, Abdomen, Front perineal area, Right upper leg, Left upper leg, Face, Buttocks, Right lower leg, Left lower leg   Body parts bathed  by helper: Buttocks, Right lower leg, Left lower leg     Bathing assist Assist Level: Supervision/Verbal cueing     Upper Body Dressing/Undressing Upper body dressing Upper body dressing/undressing activity did not occur (including orthotics): N/A What is the patient wearing?: Pull over shirt    Upper body assist Assist Level: Supervision/Verbal cueing    Lower Body Dressing/Undressing Lower body dressing    Lower body dressing activity did not occur: N/A What is the patient wearing?: Pants, Underwear/pull up     Lower body assist Assist for lower body dressing: Supervision/Verbal cueing     Toileting Toileting    Toileting assist Assist for toileting: Supervision/Verbal cueing     Transfers Chair/bed transfer  Transfers assist     Chair/bed transfer assist level: Supervision/Verbal cueing     Locomotion Ambulation   Ambulation assist   Ambulation activity did not occur: N/A  Assist level: Supervision/Verbal cueing Assistive device: Walker-rolling Max distance: 173ft   Walk 10 feet activity   Assist     Assist level: Supervision/Verbal cueing Assistive device: Walker-rolling   Walk 50 feet activity   Assist Walk 50 feet with 2 turns activity did not occur: Safety/medical concerns(fatigue, O2 sat, increased HR, decreased balance, bilateral LE weakness)  Assist level: Supervision/Verbal cueing Assistive device: Walker-rolling    Walk 150 feet activity   Assist Walk 150 feet activity did not occur: Safety/medical concerns(fatigue, O2 sat, increased HR, decreased balance, bilateral LE weakness)  Assist level: Supervision/Verbal cueing Assistive device: Walker-rolling    Walk 10 feet on uneven surface  activity   Assist Walk 10 feet on uneven surfaces activity  did not occur: Safety/medical concerns(fatigue, O2 sat, increased HR, decreased balance, bilateral LE weakness)   Assist level: Supervision/Verbal cueing Assistive device:  Aeronautical engineer Will patient use wheelchair at discharge?: No Type of Wheelchair: Manual Wheelchair activity did not occur: N/A  Wheelchair assist level: Independent Max wheelchair distance: 149ft    Wheelchair 50 feet with 2 turns activity    Assist        Assist Level: Independent   Wheelchair 150 feet activity     Assist     Assist Level: Independent      Medical Problem List and Plan:  1. Debility secondary to chronic respiratory failure pulmonary fibrosis complicated by [id number] with pneumonia with history of autism/ADHD. Continue CPAP.   DC today 2. Antithrombotics:   -DVT/anticoagulation: Subcutaneous heparin.    Vascular Dopplers negative for DVT  -antiplatelet therapy: N/A  3. Pain Management: Tylenol as needed  4. Mood: Celexa 10 mg daily, clonazepam 0.5 mg nightly  -antipsychotic agents: N/A  5. Neuropsych: This patient is not capable of making decisions on his own behalf.  6. Skin/Wound Care: Routine skin checks  7. Fluids/Electrolytes/Nutrition: Routine in and outs 8. 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 9. Chronic anemia with sickle cell trait. Continue iron supplement   Hemoglobin 11.9 on 2/22  Continue to monitor 10. Hypertension. Lasix 20 mg daily.   Controlled on 2/24  Monitor with increased mobility 11. Possible constipation  Adjust bowel meds as necessary 12. Pulmonary fibrosis:  was on 2L supplemental oxygen at al times at home.   Chest x-ray personally reviewed, improving 13. Previous ARDS/Sepsis/shock- has recovered, and treated for MRSA- finished Vanc  14.  Autism with ADHD- will monitor for chronic condition  15.  Sleep disturbance with history of OSA  Melatonin ordered  Improving  Encourage CPAP 16.  Leukocytosis   WBCs 11.7 on 2/22  Afebrile 17.  Tachycardia  Likely secondary to deconditioning  Coreg 3.125 started on 3/20  Overall stable  LOS: 8 days A FACE TO FACE  EVALUATION WAS PERFORMED  Juan Cameron [name] 03/09/2019, 8:12 AM

## 2019-03-09 NOTE — Discharge Instructions (Signed)
Inpatient Rehab Discharge Instructions  Juan Cameron Discharge date and time: No discharge date for patient encounter.   Activities/Precautions/ Functional Status: Activity: activity as tolerated Diet: regular diet Wound Care: keep wound clean and dry Functional status:  ___ No restrictions     ___ Walk up steps independently ___ 24/7 supervision/assistance   ___ Walk up steps with assistance ___ Intermittent supervision/assistance  ___ Bathe/dress independently ___ Walk with walker     _x__ Bathe/dress with assistance ___ Walk Independently    ___ Shower independently ___ Walk with assistance    ___ Shower with assistance ___ No alcohol     ___ Return to work/school ________  COMMUNITY REFERRALS UPON DISCHARGE:  Home Health: PT, OT  Agency:[facility] Phone:[phone]  Medical Equipment/Items Ordered:Tub seat  Agency/Supplier:[facility]  Special Instructions: No driving smoking or alcohol  Continue oxygen therapy as prior to admission.   My questions have been answered and I understand these instructions. I will adhere to these goals and the provided educational materials after my discharge from the hospital.  Patient/Caregiver Signature _______________________________ Date __________  Clinician Signature _______________________________________ Date __________  Please bring this form and your medication list with you to all your follow-up doctor's appointments.

## 2019-03-09 NOTE — Progress Notes (Signed)
Pt was d/c from the unit with his sister. All questions were answered and pt left with all personal belongings. Reuben Likes, LPN

## 2021-08-03 NOTE — Progress Notes (Unsigned)
@Patient  ID: , male    DOB: 07/14/1975, 46 y.o.   MRN: [id number]  No chief complaint on file.   Referring provider: [id number]  HPI: 46 year old male, never smoked. PMH significant for HTN, chronic diastolic CHF, post inflammatory pulmonary fibrosis, covid-19 PNA, chronic respiratory failure, tracheostomy dependant, obesity.   08/04/2021 Patient presents today sleep consult.       Allergies  Allergen Reactions   Peanuts [Peanut Oil] Anaphylaxis    Immunization History  Administered Date(s) Administered   Pneumococcal Polysaccharide-23 05/19/2015    Past Medical History:  Diagnosis Date   Acute on chronic respiratory failure with hypoxia (HCC)    Autism    Autism    COVID-19 virus infection    Pneumonia 02/2015   Pneumonia due to COVID-19 virus    Postinflammatory pulmonary fibrosis (HCC)    Sickle cell trait (HCC)     Tobacco History: Social History   Tobacco Use  Smoking Status Never  Smokeless Tobacco Never   Counseling given: Not Answered   Outpatient Medications Prior to Visit  Medication Sig Dispense Refill   carvedilol (COREG) 3.125 MG tablet Take 1 tablet (3.125 mg total) by mouth 2 (two) times daily with a meal. 60 tablet 0   cholecalciferol (VITAMIN D) 25 MCG tablet Take 1 tablet (1,000 Units total) by mouth daily. 30 tablet 0   citalopram (CELEXA) 10 MG tablet Take 1 tablet (10 mg total) by mouth daily. 30 tablet 0   clonazePAM (KLONOPIN) 0.5 MG tablet Take 1 tablet (0.5 mg total) by mouth at bedtime. 30 tablet 0   ferrous sulfate 325 (65 FE) MG tablet Take 1 tablet (325 mg total) by mouth daily with breakfast. 30 tablet 3   folic acid (FOLVITE) 1 MG tablet Take 1 tablet (1 mg total) by mouth daily. 30 tablet 0   furosemide (LASIX) 20 MG tablet Take 1 tablet (20 mg total) by mouth daily. 30 tablet 0   OLANZapine zydis (ZYPREXA) 5 MG disintegrating tablet Take 1 tablet (5 mg total) by mouth 2 (two) times daily. 60 tablet  1   pantoprazole (PROTONIX) 40 MG tablet Take 1 tablet (40 mg total) by mouth daily. 30 tablet 2   polyethylene glycol (MIRALAX / GLYCOLAX) 17 g packet Take 17 g by mouth daily. 14 each 0   senna-docusate (SENOKOT-S) 8.6-50 MG tablet Take 1 tablet by mouth at bedtime.     vitamin C (VITAMIN C) 250 MG tablet Take 1 tablet (250 mg total) by mouth 2 (two) times daily with a meal. 60 tablet 1   zinc sulfate 220 (50 Zn) MG capsule Take 1 capsule (220 mg total) by mouth daily. 30 capsule 0   No facility-administered medications prior to visit.      Review of Systems  Review of Systems   Physical Exam  There were no vitals taken for this visit. Physical Exam   Lab Results:  CBC    Component Value Date/Time   WBC 11.7 (H) 03/05/2019 1103   RBC 4.25 03/05/2019 1103   HGB 11.9 (L) 03/05/2019 1103   HCT 35.8 (L) 03/05/2019 1103   PLT 391 03/05/2019 1103   MCV 84.2 03/05/2019 1103   MCH 28.0 03/05/2019 1103   MCHC 33.2 03/05/2019 1103   RDW 18.5 (H) 03/05/2019 1103   LYMPHSABS 2.0 03/05/2019 1103   MONOABS 1.2 (H) 03/05/2019 1103   EOSABS 0.7 (H) 03/05/2019 1103   BASOSABS 0.1 03/05/2019 1103    BMET  Component Value Date/Time   NA 138 03/02/2019 0520   K 3.9 03/02/2019 0520   CL 100 03/02/2019 0520   CO2 28 03/02/2019 0520   GLUCOSE 103 (H) 03/02/2019 0520   BUN 11 03/02/2019 0520   CREATININE 1.13 03/02/2019 0520   CALCIUM 9.4 03/02/2019 0520   GFRNONAA >60 03/02/2019 0520   GFRAA >60 03/02/2019 0520    BNP    Component Value Date/Time   BNP 20.6 03/11/2015 0630    ProBNP No results found for: "PROBNP"  Imaging: No results found.   Assessment & Plan:   No problem-specific Assessment & Plan notes found for this encounter.     Glenford Bayley, NP 08/03/2021

## 2021-08-04 ENCOUNTER — Telehealth: Payer: Self-pay | Admitting: Primary Care

## 2021-08-04 ENCOUNTER — Encounter: Payer: Self-pay | Admitting: Primary Care

## 2021-08-04 ENCOUNTER — Ambulatory Visit (INDEPENDENT_AMBULATORY_CARE_PROVIDER_SITE_OTHER): Payer: Medicare Other | Admitting: Primary Care

## 2021-08-04 VITALS — BP 138/78 | HR 95 | Temp 97.8°F | Ht 69.0 in | Wt 265.0 lb

## 2021-08-04 DIAGNOSIS — J8410 Pulmonary fibrosis, unspecified: Secondary | ICD-10-CM | POA: Diagnosis not present

## 2021-08-04 DIAGNOSIS — E669 Obesity, unspecified: Secondary | ICD-10-CM | POA: Diagnosis not present

## 2021-08-04 DIAGNOSIS — Z9989 Dependence on other enabling machines and devices: Secondary | ICD-10-CM

## 2021-08-04 DIAGNOSIS — G4733 Obstructive sleep apnea (adult) (pediatric): Secondary | ICD-10-CM | POA: Diagnosis not present

## 2021-08-04 NOTE — Assessment & Plan Note (Signed)
-   Hx COVID PNA and ARDs. He was following with [facility] pulmonary and has attended pulmonary rehab. Unlikely he can follow commands for PFTs d.t autism. Recommend check CXR at follow-up.

## 2021-08-04 NOTE — Patient Instructions (Addendum)
Recommendations: - Continue with wear CPAP every night for 4-6 hours or longer - Please call [employer] to set up a time you can bring CPAP machine by for them to pull compliance report. Please have them fax to [phone] Attn: Buelah Manis NP  (Their phone number is [phone]/ [phone]/ Address: [address] [place]. ) - After CPAP titration study is done we can place an order for you to receive new CPAP machine   Orders: - CPAP titration study  Follow-up: - 3 months with Beth NP   CPAP and BIPAP Information CPAP and BIPAP are methods that use air pressure to keep your airways open and to help you breathe well. CPAP and BIPAP use different amounts of pressure. Your health care provider will tell you whether CPAP or BIPAP would be more helpful for you. CPAP stands for "continuous positive airway pressure." With CPAP, the amount of pressure stays the same while you breathe in (inhale) and out (exhale). BIPAP stands for "bi-level positive airway pressure." With BIPAP, the amount of pressure will be higher when you inhale and lower when you exhale. This allows you to take larger breaths. CPAP or BIPAP may be used in the hospital, or your health care provider may want you to use it at home. You may need to have a sleep study before your health care provider can order a machine for you to use at home. What are the advantages? CPAP or BIPAP can be helpful if you have: Sleep apnea. Chronic obstructive pulmonary disease (COPD). Heart failure. Medical conditions that cause muscle weakness, including muscular dystrophy or amyotrophic lateral sclerosis (ALS). Other problems that cause breathing to be shallow, weak, abnormal, or difficult. CPAP and BIPAP are most commonly used for obstructive sleep apnea (OSA) to keep the airways from collapsing when the muscles relax during sleep. What are the risks? Generally, this is a safe treatment. However, problems may occur,  including: Irritated skin or skin sores if the mask does not fit properly. Dry or stuffy nose or nosebleeds. Dry mouth. Feeling gassy or bloated. Sinus or lung infection if the equipment is not cleaned properly. When should CPAP or BIPAP be used? In most cases, the mask only needs to be worn during sleep. Generally, the mask needs to be worn throughout the night and during any daytime naps. People with certain medical conditions may also need to wear the mask at other times, such as when they are awake. Follow instructions from your health care provider about when to use the machine. What happens during CPAP or BIPAP?  Both CPAP and BIPAP are provided by a small machine with a flexible plastic tube that attaches to a plastic mask that you wear. Air is blown through the mask into your nose or mouth. The amount of pressure that is used to blow the air can be adjusted on the machine. Your health care provider will set the pressure setting and help you find the best mask for you. Tips for using the mask Because the mask needs to be snug, some people feel trapped or closed-in (claustrophobic) when first using the mask. If you feel this way, you may need to get used to the mask. One way to do this is to hold the mask loosely over your nose or mouth and then gradually apply the mask more snugly. You can also gradually increase the amount of time that you use the mask. Masks are available in various types and sizes. If your mask does  not fit well, talk with your health care provider about getting a different one. Some common types of masks include: Full face masks, which fit over the mouth and nose. Nasal masks, which fit over the nose. Nasal pillow or prong masks, which fit into the nostrils. If you are using a mask that fits over your nose and you tend to breathe through your mouth, a chin strap may be applied to help keep your mouth closed. Use a skin barrier to protect your skin as told by your health  care provider. Some CPAP and BIPAP machines have alarms that may sound if the mask comes off or develops a leak. If you have trouble with the mask, it is very important that you talk with your health care provider about finding a way to make the mask easier to tolerate. Do not stop using the mask. There could be a negative impact on your health if you stop using the mask. Tips for using the machine Place your CPAP or BIPAP machine on a secure table or stand near an electrical outlet. Know where the on/off switch is on the machine. Follow instructions from your health care provider about how to set the pressure on your machine and when you should use it. Do not eat or drink while the CPAP or BIPAP machine is on. Food or fluids could get pushed into your lungs by the pressure of the CPAP or BIPAP. For home use, CPAP and BIPAP machines can be rented or purchased through home health care companies. Many different brands of machines are available. Renting a machine before purchasing may help you find out which particular machine works well for you. Your health insurance company may also decide which machine you may get. Keep the CPAP or BIPAP machine and attachments clean. Ask your health care provider for specific instructions. Check the humidifier if you have a dry stuffy nose or nosebleeds. Make sure it is working correctly. Follow these instructions at home: Take over-the-counter and prescription medicines only as told by your health care provider. Ask if you can take sinus medicine if your sinuses are blocked. Do not use any products that contain nicotine or tobacco. These products include cigarettes, chewing tobacco, and vaping devices, such as e-cigarettes. If you need help quitting, ask your health care provider. Keep all follow-up visits. This is important. Contact a health care provider if: You have redness or pressure sores on your head, face, mouth, or nose from the mask or head gear. You  have trouble using the CPAP or BIPAP machine. You cannot tolerate wearing the CPAP or BIPAP mask. Someone tells you that you snore even when wearing your CPAP or BIPAP. Get help right away if: You have trouble breathing. You feel confused. Summary CPAP and BIPAP are methods that use air pressure to keep your airways open and to help you breathe well. If you have trouble with the mask, it is very important that you talk with your health care provider about finding a way to make the mask easier to tolerate. Do not stop using the mask. There could be a negative impact to your health if you stop using the mask. Follow instructions from your health care provider about when to use the machine. This information is not intended to replace advice given to you by your health care provider. Make sure you discuss any questions you have with your health care provider. Document Revised: 08/06/2020 Document Reviewed: 12/07/2019 Elsevier Patient Education  2023 [employer].

## 2021-08-04 NOTE — Assessment & Plan Note (Signed)
-   Hx sleep apnea. Currently on CPAP at 14cm h20. No download available, patient's sister will bring machine by [employer] for compliance report. He continues to have loud snoring symptoms and weight has recently increased. Needs CPAP titration study to determine correct pressure setting. Once completed we can place an order for patient to be provided with new CPAP machine. FU in 3 months or sooner if needed.

## 2021-08-04 NOTE — Telephone Encounter (Signed)
Can you ask sister if patient still sees pulmonary doctor at [facility], if not recommend he see Dr. Craige Cotta in October for OSA/ILD follow-up instead of me (30 min). CXR at that visit.

## 2021-08-13 NOTE — Telephone Encounter (Signed)
Attempted to call pt's sister [name] but unable to reach. Left message for her to return call.

## 2021-08-13 NOTE — Progress Notes (Signed)
Reviewed and agree with assessment/plan.   Christyanna Mckeon, MD [facility] 08/13/2021, 9:50 AM Pager:  [phone]  

## 2021-08-14 NOTE — Telephone Encounter (Signed)
Tried to call [name] again but still unable to reach. Will try again later.

## 2021-08-18 NOTE — Telephone Encounter (Signed)
Due to not being able to reach pt's sister [name], have sent a mychart message with the requested info. Will await to see if receive a reply.

## 2021-09-04 ENCOUNTER — Ambulatory Visit (HOSPITAL_BASED_OUTPATIENT_CLINIC_OR_DEPARTMENT_OTHER): Payer: Medicare Other | Attending: Primary Care | Admitting: Pulmonary Disease

## 2021-09-04 DIAGNOSIS — G4733 Obstructive sleep apnea (adult) (pediatric): Secondary | ICD-10-CM | POA: Insufficient documentation

## 2021-09-08 DIAGNOSIS — G4733 Obstructive sleep apnea (adult) (pediatric): Secondary | ICD-10-CM

## 2021-09-08 NOTE — Procedures (Signed)
     Patient Name: Juan Cameron, Juan Cameron Date: 09/04/2021 Gender: Male D.O.B: 1975-03-15 Age (years): 23 Referring Provider: Ames Dura NP Height (inches): 69 Interpreting Physician: Coralyn Helling MD, ABSM Weight (lbs): 260 RPSGT: Rolene Arbour BMI: 38 MRN: [id number] Neck Size: 18.00  CLINICAL INFORMATION The patient is referred for a CPAP titration to treat sleep apnea.  Date of Split Night 08/15/15: AHI 25.2, SpO2 low 81%  SLEEP STUDY TECHNIQUE As per the AASM Manual for the Scoring of Sleep and Associated Events v2.3 (April 2016) with a hypopnea requiring 4% desaturations.  The channels recorded and monitored were frontal, central and occipital EEG, electrooculogram (EOG), submentalis EMG (chin), nasal and oral airflow, thoracic and abdominal wall motion, anterior tibialis EMG, snore microphone, electrocardiogram, and pulse oximetry. Continuous positive airway pressure (CPAP) was initiated at the beginning of the study and titrated to treat sleep-disordered breathing.  MEDICATIONS Medications self-administered by patient taken the night of the study : N/A  TECHNICIAN COMMENTS Comments added by technician: none Comments added by scorer: N/A  RESPIRATORY PARAMETERS Optimal PAP Pressure (cm): 17 AHI at Optimal Pressure (/hr): 0.3 Overall Minimal O2 (%): 86.0 Supine % at Optimal Pressure (%): 14 Minimal O2 at Optimal Pressure (%): 89.0   SLEEP ARCHITECTURE The study was initiated at 10:22:44 PM and ended at 4:45:54 AM.  Sleep onset time was 0.5 minutes and the sleep efficiency was 92.4%%. The total sleep time was 354.2 minutes.  The patient spent 0.6%% of the night in stage N1 sleep, 69.2%% in stage N2 sleep, 0.0%% in stage N3 and 30.2% in REM.Stage REM latency was 59.0 minutes  Wake after sleep onset was 28.5. Alpha intrusion was absent. Supine sleep was 54.17%.  CARDIAC DATA The 2 lead EKG demonstrated sinus rhythm. The mean heart rate was 66.3 beats per  minute. Other EKG findings include: None.  LEG MOVEMENT DATA The total Periodic Limb Movements of Sleep (PLMS) were 0. The PLMS index was 0.0. A PLMS index of <15 is considered normal in adults.  IMPRESSIONS - The optimal PAP pressure was 17 cm of water. - He did not require supplemental oxygen during this study.  DIAGNOSIS - Obstructive Sleep Apnea (G47.33)  RECOMMENDATIONS - Trial of CPAP therapy on 17 cm H2O with a Large size [employer]kel Full Face Simplus mask and heated humidification. - Avoid alcohol, sedatives and other CNS depressants that may worsen sleep apnea and disrupt normal sleep architecture. - Sleep hygiene should be reviewed to assess factors that may improve sleep quality. - Weight management and regular exercise should be initiated or continued.  [Electronically signed] 09/08/2021 05:03 PM  Coralyn Helling MD, ABSM Diplomate, American Board of Sleep Medicine NPI: [id number]  [facility] PH: [phone]   FX: [phone] ACCREDITED BY THE AMERICAN ACADEMY OF SLEEP MEDICINE

## 2021-10-30 ENCOUNTER — Ambulatory Visit: Payer: Medicare Other | Admitting: Primary Care

## 2021-12-16 ENCOUNTER — Ambulatory Visit (INDEPENDENT_AMBULATORY_CARE_PROVIDER_SITE_OTHER): Payer: Medicare Other | Admitting: Primary Care

## 2021-12-16 ENCOUNTER — Encounter: Payer: Self-pay | Admitting: Primary Care

## 2021-12-16 ENCOUNTER — Telehealth: Payer: Self-pay | Admitting: Primary Care

## 2021-12-16 VITALS — BP 138/82 | HR 89 | Temp 98.2°F | Ht 70.0 in | Wt 267.0 lb

## 2021-12-16 DIAGNOSIS — J310 Chronic rhinitis: Secondary | ICD-10-CM | POA: Insufficient documentation

## 2021-12-16 DIAGNOSIS — R0683 Snoring: Secondary | ICD-10-CM

## 2021-12-16 DIAGNOSIS — J9611 Chronic respiratory failure with hypoxia: Secondary | ICD-10-CM | POA: Diagnosis not present

## 2021-12-16 DIAGNOSIS — J00 Acute nasopharyngitis [common cold]: Secondary | ICD-10-CM

## 2021-12-16 DIAGNOSIS — J8410 Pulmonary fibrosis, unspecified: Secondary | ICD-10-CM

## 2021-12-16 DIAGNOSIS — G4733 Obstructive sleep apnea (adult) (pediatric): Secondary | ICD-10-CM | POA: Diagnosis not present

## 2021-12-16 MED ORDER — FLUTICASONE PROPIONATE 50 MCG/ACT NA SUSP: 1.0000 | Freq: Every day | NASAL | 2 refills | Status: AC

## 2021-12-16 NOTE — Assessment & Plan Note (Signed)
-   Hx COVID PNA and ARDs. He was following with [facility] pulmonary and has attended pulmonary rehab. Unlikely he can follow commands for PFTs d.t autism. Recommend following up with routine CXR.

## 2021-12-16 NOTE — Patient Instructions (Signed)
Excellent compliance with CPAP, apneas are well-controlled Continue to wear CPAP every night for minimum 4 to 6 hours or longer Start using fluticasone (Flonase) 1 puff per nostril daily for rhinitis symptoms  Orders New CPAP machine Change CPAP pressure from auto settings to set pressure 17 cm H2O  Follow-up 6 months with Beth NP or sooner if needed  CPAP and BIPAP Information CPAP and BIPAP are methods that use air pressure to keep your airways open and to help you breathe well. CPAP and BIPAP use different amounts of pressure. Your health care provider will tell you whether CPAP or BIPAP would be more helpful for you. CPAP stands for "continuous positive airway pressure." With CPAP, the amount of pressure stays the same while you breathe in (inhale) and out (exhale). BIPAP stands for "bi-level positive airway pressure." With BIPAP, the amount of pressure will be higher when you inhale and lower when you exhale. This allows you to take larger breaths. CPAP or BIPAP may be used in the hospital, or your health care provider may want you to use it at home. You may need to have a sleep study before your health care provider can order a machine for you to use at home. What are the advantages? CPAP or BIPAP can be helpful if you have: Sleep apnea. Chronic obstructive pulmonary disease (COPD). Heart failure. Medical conditions that cause muscle weakness, including muscular dystrophy or amyotrophic lateral sclerosis (ALS). Other problems that cause breathing to be shallow, weak, abnormal, or difficult. CPAP and BIPAP are most commonly used for obstructive sleep apnea (OSA) to keep the airways from collapsing when the muscles relax during sleep. What are the risks? Generally, this is a safe treatment. However, problems may occur, including: Irritated skin or skin sores if the mask does not fit properly. Dry or stuffy nose or nosebleeds. Dry mouth. Feeling gassy or bloated. Sinus or lung  infection if the equipment is not cleaned properly. When should CPAP or BIPAP be used? In most cases, the mask only needs to be worn during sleep. Generally, the mask needs to be worn throughout the night and during any daytime naps. People with certain medical conditions may also need to wear the mask at other times, such as when they are awake. Follow instructions from your health care provider about when to use the machine. What happens during CPAP or BIPAP?  Both CPAP and BIPAP are provided by a small machine with a flexible plastic tube that attaches to a plastic mask that you wear. Air is blown through the mask into your nose or mouth. The amount of pressure that is used to blow the air can be adjusted on the machine. Your health care provider will set the pressure setting and help you find the best mask for you. Tips for using the mask Because the mask needs to be snug, some people feel trapped or closed-in (claustrophobic) when first using the mask. If you feel this way, you may need to get used to the mask. One way to do this is to hold the mask loosely over your nose or mouth and then gradually apply the mask more snugly. You can also gradually increase the amount of time that you use the mask. Masks are available in various types and sizes. If your mask does not fit well, talk with your health care provider about getting a different one. Some common types of masks include: Full face masks, which fit over the mouth and nose. Nasal masks, which  fit over the nose. Nasal pillow or prong masks, which fit into the nostrils. If you are using a mask that fits over your nose and you tend to breathe through your mouth, a chin strap may be applied to help keep your mouth closed. Use a skin barrier to protect your skin as told by your health care provider. Some CPAP and BIPAP machines have alarms that may sound if the mask comes off or develops a leak. If you have trouble with the mask, it is very  important that you talk with your health care provider about finding a way to make the mask easier to tolerate. Do not stop using the mask. There could be a negative impact on your health if you stop using the mask. Tips for using the machine Place your CPAP or BIPAP machine on a secure table or stand near an electrical outlet. Know where the on/off switch is on the machine. Follow instructions from your health care provider about how to set the pressure on your machine and when you should use it. Do not eat or drink while the CPAP or BIPAP machine is on. Food or fluids could get pushed into your lungs by the pressure of the CPAP or BIPAP. For home use, CPAP and BIPAP machines can be rented or purchased through home health care companies. Many different brands of machines are available. Renting a machine before purchasing may help you find out which particular machine works well for you. Your health insurance company may also decide which machine you may get. Keep the CPAP or BIPAP machine and attachments clean. Ask your health care provider for specific instructions. Check the humidifier if you have a dry stuffy nose or nosebleeds. Make sure it is working correctly. Follow these instructions at home: Take over-the-counter and prescription medicines only as told by your health care provider. Ask if you can take sinus medicine if your sinuses are blocked. Do not use any products that contain nicotine or tobacco. These products include cigarettes, chewing tobacco, and vaping devices, such as e-cigarettes. If you need help quitting, ask your health care provider. Keep all follow-up visits. This is important. Contact a health care provider if: You have redness or pressure sores on your head, face, mouth, or nose from the mask or head gear. You have trouble using the CPAP or BIPAP machine. You cannot tolerate wearing the CPAP or BIPAP mask. Someone tells you that you snore even when wearing your CPAP or  BIPAP. Get help right away if: You have trouble breathing. You feel confused. Summary CPAP and BIPAP are methods that use air pressure to keep your airways open and to help you breathe well. If you have trouble with the mask, it is very important that you talk with your health care provider about finding a way to make the mask easier to tolerate. Do not stop using the mask. There could be a negative impact to your health if you stop using the mask. Follow instructions from your health care provider about when to use the machine. This information is not intended to replace advice given to you by your health care provider. Make sure you discuss any questions you have with your health care provider. Document Revised: 08/06/2020 Document Reviewed: 12/07/2019 Elsevier Patient Education  2023 [employer].

## 2021-12-16 NOTE — Telephone Encounter (Signed)
His sister, I'm sorry

## 2021-12-16 NOTE — Telephone Encounter (Signed)
Can we please call patient's legal guardian and ask for her to ensure patient has a chest x-ray in the next 4 to 6 weeks.  He had some nasal congestion and a cough today.  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

## 2021-12-16 NOTE — Assessment & Plan Note (Signed)
-   Patient is overall compliant with CPAP use but resistant to wearing at times per his legal guardian. CPAP titration study in August 2023 showed optimal pressure 17 cm H2O. Current settings 4 - 20 cm H2O (95th percentile-8.9 cm H2O); Residual AHI 2.4.  Recommending we trial patient on set pressure 17 cm H2O to see if this improved tolerability. He also needs an order for new CPAP machine. FU in 6 months or sooner if needed.

## 2021-12-16 NOTE — Progress Notes (Signed)
@Patient  ID: , male    DOB: November 07, 1975, 46 y.o.   MRN: [id number]  Chief Complaint  Patient presents with   Follow-up    Review sleep compliance    Referring provider: [id number]., MD  HPI: 46 year old male, never smoked. PMH significant for autism, HTN, chronic diastolic CHF, post inflammatory pulmonary fibrosis, covid-19 PNA, pulmonary fibrosis, aspiration pneumonia, chronic respiratory failure, obesity.   Previous [facility] pulmonary encounter: 08/04/2021 Patient presents today sleep consult. Accompanied by his sister who is primary caretaker. Patient has hx sleep apnea, currently on CPAP. Patient had split-night sleep study on 08/15/2015 that showed evidence of obstructive sleep apnea, AHI 25.2 an hour with SPO2 low 81%.  He was fitted for a ResMed air fit F20 large fullface mask.  Optimal CPAP pressure setting 14 cm H2O. Patient's sister reports 100% compliance with CPAP use. He had a break in CPAP therapy in 2020 d/t hospitalization for COVID PNA. Previous following with [facility] pulmonary for ARDs and ILD. He underwent pulmonary rehab with [facility] in 2019. He is due for new CPAP machine and likely needs pressure setting adjusted. He continues to have snoring symptoms. Weight increased. Typical bedtime is around 11:30pm. It takes him 20 min to fall asleep. He wakes up on average once a night with use the restroom. He start his day at 6:30am. He attends day program from 9-3pm. He will take a two hour nap in the afternoon. Epworth scale 16/24.  Airview download 08/04/20-08/03/21 Usage 345/365 (95%); 296 (81%) greater than 4 hours Average usage 5 hours 44 minutes Pressure 4-20cm h20 (17.8 cm h20-95%) Air leaks 14L/min AHI 2.0   12/16/2021- Interim hx  Patient presents today for 74-month follow-up OSA/CPAP compliance.  Accompanied by his sister who is his legal guardian. She has noticed that he has had increased difficulty tolerating his CPAP at night, she reports that he does not want to  wear his mask at night. No particular issues with mask fit. He had a CPAP titration study in August that showed optimal pressure 17cm h20. SpO2 low on optimal pressure was 89%. He is still wearing 2L oxygen at night, she reports that he sleeps better when wearing oxygen.     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 Oil] Anaphylaxis    Immunization History  Administered Date(s) Administered   Influenza Split 10/23/2015   Influenza,inj,Quad PF,6+ Mos 10/24/2017, 10/06/2018   Moderna Sars-Covid-2 Vaccination 04/04/2019, 05/02/2019, 12/12/2019   Pneumococcal Polysaccharide-23 05/19/2015    Past Medical History:  Diagnosis Date   Acute on chronic respiratory failure with hypoxia (HCC)    Autism    Autism    COVID-19 virus infection    Pneumonia 02/2015   Pneumonia due to COVID-19 virus    Postinflammatory pulmonary fibrosis (HCC)    Sickle cell trait (HCC)     Tobacco History: Social History   Tobacco Use  Smoking Status Never  Smokeless Tobacco Never   Counseling given: Not Answered   Outpatient Medications Prior to Visit  Medication Sig Dispense Refill   carvedilol (COREG) 3.125 MG tablet Take 1 tablet (3.125 mg total) by mouth 2 (two) times daily with a meal. 60 tablet 0   cholecalciferol (VITAMIN D) 25 MCG tablet Take 1  tablet (1,000 Units total) by mouth daily. 30 tablet 0   citalopram (CELEXA) 10 MG tablet Take 1 tablet (10 mg total) by mouth daily. 30 tablet 0   clonazePAM (KLONOPIN) 0.5 MG tablet Take 1 tablet (0.5 mg total) by mouth at bedtime. 30 tablet 0   ferrous sulfate 325 (65 FE) MG tablet Take 1 tablet (325 mg total) by mouth daily with breakfast. 30 tablet 3   folic acid (FOLVITE) 1 MG tablet Take 1 tablet (1 mg total) by  mouth daily. 30 tablet 0   furosemide (LASIX) 20 MG tablet Take 1 tablet (20 mg total) by mouth daily. 30 tablet 0   OLANZapine zydis (ZYPREXA) 5 MG disintegrating tablet Take 1 tablet (5 mg total) by mouth 2 (two) times daily. 60 tablet 1   pantoprazole (PROTONIX) 40 MG tablet Take 1 tablet (40 mg total) by mouth daily. 30 tablet 2   polyethylene glycol (MIRALAX / GLYCOLAX) 17 g packet Take 17 g by mouth daily. 14 each 0   senna-docusate (SENOKOT-S) 8.6-50 MG tablet Take 1 tablet by mouth at bedtime.     vitamin C (VITAMIN C) 250 MG tablet Take 1 tablet (250 mg total) by mouth 2 (two) times daily with a meal. 60 tablet 1   zinc sulfate 220 (50 Zn) MG capsule Take 1 capsule (220 mg total) by mouth daily. 30 capsule 0   No facility-administered medications prior to visit.    Review of Systems  Review of Systems  Constitutional: Negative.   HENT:  Positive for postnasal drip and rhinorrhea.   Respiratory:  Positive for cough. Negative for chest tightness, shortness of breath and wheezing.     Physical Exam  BP 138/82 (BP Location: Right Arm, Patient Position: Sitting, Cuff Size: Large)   Pulse 89   Temp 98.2 F (36.8 C) (Oral)   Ht 5\' 10"  (1.778 m)   Wt 267 lb (121.1 kg)   SpO2 98%   BMI 38.31 kg/m  Physical Exam Constitutional:      Appearance: Normal appearance. He is obese.  HENT:     Head: Normocephalic and atraumatic.  Cardiovascular:     Rate and Rhythm: Normal rate and regular rhythm.  Pulmonary:     Effort: Pulmonary effort is normal.     Breath sounds: Normal breath sounds. No wheezing, rhonchi or rales.     Comments: CTA Musculoskeletal:        General: Normal range of motion.  Skin:    General: Skin is warm and dry.  Neurological:     General: No focal deficit present.     Mental Status: He is alert and oriented to person, place, and time. Mental status is at baseline.  Psychiatric:        Mood and Affect: Mood normal.        Behavior: Behavior normal.         Thought Content: Thought content normal.        Judgment: Judgment normal.      Lab Results:  CBC    Component Value Date/Time   WBC 11.7 (H) 03/05/2019 1103   RBC 4.25 03/05/2019 1103   HGB 11.9 (L) 03/05/2019 1103   HCT 35.8 (L) 03/05/2019 1103   PLT 391 03/05/2019 1103   MCV 84.2 03/05/2019 1103   MCH 28.0 03/05/2019 1103   MCHC 33.2 03/05/2019 1103   RDW 18.5 (H) 03/05/2019 1103   LYMPHSABS 2.0 03/05/2019 1103   MONOABS 1.2 (H) 03/05/2019 1103  EOSABS 0.7 (H) 03/05/2019 1103   BASOSABS 0.1 03/05/2019 1103    BMET    Component Value Date/Time   NA 138 03/02/2019 0520   K 3.9 03/02/2019 0520   CL 100 03/02/2019 0520   CO2 28 03/02/2019 0520   GLUCOSE 103 (H) 03/02/2019 0520   BUN 11 03/02/2019 0520   CREATININE 1.13 03/02/2019 0520   CALCIUM 9.4 03/02/2019 0520   GFRNONAA >60 03/02/2019 0520   GFRAA >60 03/02/2019 0520    BNP    Component Value Date/Time   BNP 20.6 03/11/2015 0630    ProBNP No results found for: "PROBNP"  Imaging: No results found.   Assessment & Plan:   OSA on CPAP - Patient is overall compliant with CPAP use but resistant to wearing at times per his legal guardian. CPAP titration study in August 2023 showed optimal pressure 17 cm H2O. Current settings 4 - 20 cm H2O (95th percentile-8.9 cm H2O); Residual AHI 2.4.  Recommending we trial patient on set pressure 17 cm H2O to see if this improved tolerability. He also needs an order for new CPAP machine. FU in 6 months or sooner if needed.   Respiratory failure (HCC) - Continue 2L oxygen at bedtime with CPAP   Rhinitis - Patient has symptoms of PND and cough. Advised he start fluticasone nasal spray  Postinflammatory pulmonary fibrosis (HCC) - Hx COVID PNA and ARDs. He was following with [facility] pulmonary and has attended pulmonary rehab. Unlikely he can follow commands for PFTs d.t autism. Recommend following up with routine CXR.    Martyn Ehrich, NP 12/16/2021

## 2021-12-16 NOTE — Assessment & Plan Note (Signed)
-   Patient has symptoms of PND and cough. Advised he start fluticasone nasal spray

## 2021-12-16 NOTE — Assessment & Plan Note (Signed)
-   Continue 2L oxygen at bedtime with CPAP

## 2021-12-16 NOTE — Telephone Encounter (Signed)
Do we know who pt's legal guardian is?

## 2021-12-17 ENCOUNTER — Encounter: Payer: Self-pay | Admitting: *Deleted

## 2021-12-17 NOTE — Telephone Encounter (Signed)
Ok, thank you. Can we send a letter

## 2021-12-17 NOTE — Telephone Encounter (Signed)
Letter written with info from [facility]. Nothing further needed.

## 2021-12-17 NOTE — Telephone Encounter (Signed)
Attempted to call pt's sister [name]. A gentleman answered the phone and said that we had the wrong number. Tried to call the same number that we have on file for [employer] and same thing happened.  Routing to [employer] as an Financial planner.

## 2021-12-21 NOTE — Progress Notes (Signed)
Reviewed and agree with assessment/plan.   Ario Mcdiarmid, MD [facility] 12/21/2021, 7:04 AM Pager:  [phone]  

## 2022-01-06 ENCOUNTER — Encounter: Payer: Medicare Other | Admitting: Nurse Practitioner

## 2022-06-30 ENCOUNTER — Telehealth: Payer: Self-pay | Admitting: Primary Care

## 2022-06-30 NOTE — Telephone Encounter (Signed)
[name] sister states patient CPAP machine pressure settings need to be changed.[name] phone number is [phone].

## 2022-06-30 NOTE — Telephone Encounter (Signed)
He's not using it much. Only 25% >4 hr. Did he feel like he was doing better when he was on an auto setting on his previous settings? If so, please adjust him to 10-18 cmH2O. We can see how he does on this. Thanks.

## 2022-06-30 NOTE — Telephone Encounter (Signed)
I have printed the download from [employer] and Florentina Addison stated she will review it due to Clent Ridges being out of the office. Katie please advise

## 2022-07-01 ENCOUNTER — Other Ambulatory Visit: Payer: Self-pay

## 2022-07-01 DIAGNOSIS — G4733 Obstructive sleep apnea (adult) (pediatric): Secondary | ICD-10-CM

## 2022-07-01 NOTE — Telephone Encounter (Signed)
Spoke with patients sister. She states patient was doing better with auto setting. I advised I would send order to [employer] to change current settings. Patient verbalized understanding. NFN

## 2022-07-23 ENCOUNTER — Telehealth: Payer: Self-pay | Admitting: Primary Care

## 2022-07-23 NOTE — Telephone Encounter (Signed)
PT no longer on O@ and sister says he needs a letter sent to [employer] to discontinue O2. Pt's sister's number is [phone]

## 2022-07-26 NOTE — Telephone Encounter (Signed)
Spoke with patient sister. She states patient hasn't been on oxygen in 2 years and is wanting [employer] to come and pick it up.  Beth are you okay with me D/C oxygen?

## 2022-07-26 NOTE — Telephone Encounter (Signed)
In December it looks like he was reportedly wearing oxygen at night. We would need to repeat ONO on RA to assess severity of nocturnal hypoxia prior to discontinuing. If they are ok with this please place order and have her send [place] a mychart message after he completes this and we can review/discuss coming off oxygen

## 2022-07-30 NOTE — Telephone Encounter (Signed)
[name] sister would like discontinuing of patient's oxygen. [name] phone number is [phone].

## 2022-08-02 NOTE — Telephone Encounter (Signed)
Spoke with the pt's sister, [name]  She is upset that we want to repeat another ONO  She states that the pt has not used o2 in over a year  Can we please place order to d/c as pt is not wanting to have repeat ONO, thanks!

## 2022-08-02 NOTE — Telephone Encounter (Signed)
  As far as I can tell he had a sleep study in 2017 that showed OSA corrected with CPAP but still required 2L oxygen I do not see any other testing that would negate his need for oxygen  I would recommend checking ONO to determine whether or not we can safely discontinue oxygen If patient/family declines please document that and we can place an order to discontinue but it would be going against my advice

## 2022-08-02 NOTE — Telephone Encounter (Signed)
Called and spoke with [place]. She stated that the reason she is confused is because she remembers being told back in December that he no longer needed to wear the oxygen at night with the cpap. I looked in the AVS and OV notes and did not see any mentioning of this. I offered to order the ONO but she wants to hold off at this time.   Nothing further needed at time of call.

## 2022-08-04 ENCOUNTER — Telehealth: Payer: Self-pay | Admitting: Primary Care

## 2022-08-04 NOTE — Telephone Encounter (Signed)
Working Recall pt. No longer wants to f/u going with a different practice

## 2022-08-04 NOTE — Telephone Encounter (Signed)
OK no problem, thank you
